# Patient Record
Sex: Female | Born: 1965 | Race: White | Hispanic: No | Marital: Married | Smoking: Former smoker
Health system: Southern US, Community
[De-identification: ages and names within clinical notes are randomized; demographics above are authoritative.]

## PROBLEM LIST (undated history)

## (undated) DIAGNOSIS — F329 Major depressive disorder, single episode, unspecified: Secondary | ICD-10-CM

## (undated) DIAGNOSIS — C3490 Malignant neoplasm of unspecified part of unspecified bronchus or lung: Secondary | ICD-10-CM

## (undated) DIAGNOSIS — M5416 Radiculopathy, lumbar region: Secondary | ICD-10-CM

## (undated) DIAGNOSIS — D72829 Elevated white blood cell count, unspecified: Secondary | ICD-10-CM

## (undated) DIAGNOSIS — S7291XA Unspecified fracture of right femur, initial encounter for closed fracture: Secondary | ICD-10-CM

## (undated) DIAGNOSIS — K635 Polyp of colon: Secondary | ICD-10-CM

## (undated) DIAGNOSIS — K219 Gastro-esophageal reflux disease without esophagitis: Secondary | ICD-10-CM

## (undated) DIAGNOSIS — E785 Hyperlipidemia, unspecified: Secondary | ICD-10-CM

## (undated) DIAGNOSIS — R87619 Unspecified abnormal cytological findings in specimens from cervix uteri: Secondary | ICD-10-CM

## (undated) DIAGNOSIS — K5790 Diverticulosis of intestine, part unspecified, without perforation or abscess without bleeding: Secondary | ICD-10-CM

## (undated) DIAGNOSIS — K529 Noninfective gastroenteritis and colitis, unspecified: Secondary | ICD-10-CM

## (undated) DIAGNOSIS — F419 Anxiety disorder, unspecified: Secondary | ICD-10-CM

## (undated) DIAGNOSIS — F32A Depression, unspecified: Secondary | ICD-10-CM

## (undated) DIAGNOSIS — I10 Essential (primary) hypertension: Secondary | ICD-10-CM

## (undated) DIAGNOSIS — R002 Palpitations: Secondary | ICD-10-CM

## (undated) DIAGNOSIS — R072 Precordial pain: Secondary | ICD-10-CM

## (undated) HISTORY — DX: Major depressive disorder, single episode, unspecified: F32.9

## (undated) HISTORY — DX: Noninfective gastroenteritis and colitis, unspecified: K52.9

## (undated) HISTORY — DX: Essential (primary) hypertension: I10

## (undated) HISTORY — DX: Unspecified fracture of right femur, initial encounter for closed fracture: S72.91XA

## (undated) HISTORY — DX: Depression, unspecified: F32.A

## (undated) HISTORY — DX: Precordial pain: R07.2

## (undated) HISTORY — PX: FRACTURE SURGERY: SHX138

## (undated) HISTORY — DX: Malignant neoplasm of unspecified part of unspecified bronchus or lung: C34.90

## (undated) HISTORY — DX: Palpitations: R00.2

## (undated) HISTORY — DX: Unspecified abnormal cytological findings in specimens from cervix uteri: R87.619

## (undated) HISTORY — PX: CERVICAL BIOPSY  W/ LOOP ELECTRODE EXCISION: SUR135

## (undated) HISTORY — DX: Anxiety disorder, unspecified: F41.9

## (undated) SURGERY — Surgical Case
Anesthesia: *Unknown

## (undated) MED FILL — Fosaprepitant Dimeglumine For IV Infusion 150 MG (Base Eq): INTRAVENOUS | Qty: 5 | Status: AC

## (undated) MED FILL — Dexamethasone Sodium Phosphate Inj 100 MG/10ML: INTRAMUSCULAR | Qty: 1 | Status: AC

---

## 2007-11-07 ENCOUNTER — Encounter: Payer: Self-pay | Admitting: Obstetrics & Gynecology

## 2007-11-07 ENCOUNTER — Ambulatory Visit: Payer: Self-pay | Admitting: Obstetrics & Gynecology

## 2007-12-20 ENCOUNTER — Ambulatory Visit: Payer: Self-pay | Admitting: Gastroenterology

## 2010-10-13 ENCOUNTER — Ambulatory Visit: Payer: Self-pay

## 2010-10-21 ENCOUNTER — Ambulatory Visit: Payer: Self-pay
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
## 2011-04-14 ENCOUNTER — Ambulatory Visit: Payer: Self-pay

## 2011-10-19 ENCOUNTER — Ambulatory Visit: Payer: Self-pay

## 2012-04-20 ENCOUNTER — Ambulatory Visit: Payer: Self-pay

## 2013-04-23 ENCOUNTER — Ambulatory Visit: Payer: Self-pay | Admitting: Family Medicine

## 2013-12-26 ENCOUNTER — Encounter (HOSPITAL_COMMUNITY): Payer: Self-pay | Admitting: Emergency Medicine

## 2013-12-26 ENCOUNTER — Emergency Department (HOSPITAL_COMMUNITY): Payer: BC Managed Care – PPO

## 2013-12-26 ENCOUNTER — Emergency Department (HOSPITAL_COMMUNITY)
Admission: EM | Admit: 2013-12-26 | Discharge: 2013-12-26 | Disposition: A | Discharge status: Home / Self Care | Payer: BC Managed Care – PPO | Attending: Emergency Medicine | Admitting: Emergency Medicine

## 2013-12-26 DIAGNOSIS — I10 Essential (primary) hypertension: Secondary | ICD-10-CM | POA: Insufficient documentation

## 2013-12-26 DIAGNOSIS — K5732 Diverticulitis of large intestine without perforation or abscess without bleeding: Secondary | ICD-10-CM | POA: Insufficient documentation

## 2013-12-26 DIAGNOSIS — K5792 Diverticulitis of intestine, part unspecified, without perforation or abscess without bleeding: Secondary | ICD-10-CM

## 2013-12-26 DIAGNOSIS — F17200 Nicotine dependence, unspecified, uncomplicated: Secondary | ICD-10-CM | POA: Insufficient documentation

## 2013-12-26 DIAGNOSIS — Z79899 Other long term (current) drug therapy: Secondary | ICD-10-CM | POA: Insufficient documentation

## 2013-12-26 LAB — CBC WITH DIFFERENTIAL/PLATELET
Basophils Absolute: 0 10*3/uL (ref 0.0–0.1)
Basophils Relative: 0 % (ref 0–1)
Eosinophils Absolute: 0.2 10*3/uL (ref 0.0–0.7)
Eosinophils Relative: 1 % (ref 0–5)
HCT: 43.4 % (ref 36.0–46.0)
Hemoglobin: 14.1 g/dL (ref 12.0–15.0)
Lymphocytes Relative: 23 % (ref 12–46)
Lymphs Abs: 4.1 10*3/uL — ABNORMAL HIGH (ref 0.7–4.0)
MCH: 30.1 pg (ref 26.0–34.0)
MCHC: 32.5 g/dL (ref 30.0–36.0)
MCV: 92.5 fL (ref 78.0–100.0)
Monocytes Absolute: 1.5 10*3/uL — ABNORMAL HIGH (ref 0.1–1.0)
Monocytes Relative: 9 % (ref 3–12)
Neutro Abs: 12 10*3/uL — ABNORMAL HIGH (ref 1.7–7.7)
Neutrophils Relative %: 67 % (ref 43–77)
Platelets: 269 10*3/uL (ref 150–400)
RBC: 4.69 MIL/uL (ref 3.87–5.11)
RDW: 14.5 % (ref 11.5–15.5)
WBC: 17.9 10*3/uL — ABNORMAL HIGH (ref 4.0–10.5)

## 2013-12-26 LAB — BASIC METABOLIC PANEL
BUN: 12 mg/dL (ref 6–23)
CO2: 21 mEq/L (ref 19–32)
Calcium: 8.9 mg/dL (ref 8.4–10.5)
Chloride: 101 mEq/L (ref 96–112)
Creatinine, Ser: 0.67 mg/dL (ref 0.50–1.10)
GFR calc Af Amer: >90 mL/min (ref >90)
GFR calc non Af Amer: >90 mL/min (ref >90)
Glucose, Bld: 86 mg/dL (ref 70–99)
Potassium: 4.1 mEq/L (ref 3.7–5.3)
Sodium: 138 mEq/L (ref 137–147)

## 2013-12-26 LAB — URINE MICROSCOPIC-ADD ON

## 2013-12-26 LAB — URINALYSIS, ROUTINE W REFLEX MICROSCOPIC
Bilirubin Urine: NEGATIVE
Glucose, UA: NEGATIVE mg/dL
Ketones, ur: NEGATIVE mg/dL
Leukocytes, UA: NEGATIVE
Nitrite: NEGATIVE
Protein, ur: NEGATIVE mg/dL
Specific Gravity, Urine: 1.028 (ref 1.005–1.030)
Urobilinogen, UA: 0.2 mg/dL (ref 0.0–1.0)
pH: 5 (ref 5.0–8.0)

## 2013-12-26 LAB — PREGNANCY, URINE: Preg Test, Ur: NEGATIVE

## 2013-12-26 MED ORDER — METRONIDAZOLE 500 MG PO TABS: 500.0000 mg | ORAL_TABLET | Freq: Three times a day (TID) | ORAL | Status: DC

## 2013-12-26 MED ORDER — OXYCODONE-ACETAMINOPHEN 5-325 MG PO TABS: 1.0000 | ORAL_TABLET | ORAL | Status: DC | PRN

## 2013-12-26 MED ORDER — ONDANSETRON HCL 4 MG/2ML IJ SOLN
4.0000 mg | Freq: Once | INTRAMUSCULAR | Status: AC
  Administered 2013-12-26: 4 mg via INTRAVENOUS
  Filled 2013-12-26: qty 2

## 2013-12-26 MED ORDER — MORPHINE SULFATE 4 MG/ML IJ SOLN
4.0000 mg | Freq: Once | INTRAMUSCULAR | Status: AC
  Administered 2013-12-26: 4 mg via INTRAVENOUS
  Filled 2013-12-26: qty 1

## 2013-12-26 MED ORDER — KETOROLAC TROMETHAMINE 15 MG/ML IJ SOLN
15.0000 mg | Freq: Once | INTRAMUSCULAR | Status: AC
  Administered 2013-12-26: 15 mg via INTRAVENOUS
  Filled 2013-12-26: qty 1

## 2013-12-26 MED ORDER — CIPROFLOXACIN IN D5W 400 MG/200ML IV SOLN
400.0000 mg | Freq: Once | INTRAVENOUS | Status: AC
  Administered 2013-12-26: 400 mg via INTRAVENOUS
  Filled 2013-12-26: qty 200

## 2013-12-26 MED ORDER — SODIUM CHLORIDE 0.9 % IV BOLUS (SEPSIS)
1000.0000 mL | Freq: Once | INTRAVENOUS | Status: AC
  Administered 2013-12-26: 1000 mL via INTRAVENOUS

## 2013-12-26 MED ORDER — CIPROFLOXACIN HCL 500 MG PO TABS: 500.0000 mg | ORAL_TABLET | Freq: Two times a day (BID) | ORAL | Status: DC

## 2013-12-26 MED ORDER — IOHEXOL 300 MG/ML  SOLN
100.0000 mL | Freq: Once | INTRAMUSCULAR | Status: AC | PRN
  Administered 2013-12-26: 100 mL via INTRAVENOUS

## 2013-12-26 MED ORDER — IOHEXOL 300 MG/ML  SOLN
25.0000 mL | Freq: Once | INTRAMUSCULAR | Status: AC | PRN
  Administered 2013-12-26: 25 mL via ORAL

## 2013-12-26 MED ORDER — METRONIDAZOLE IN NACL 5-0.79 MG/ML-% IV SOLN
500.0000 mg | Freq: Once | INTRAVENOUS | Status: AC
  Administered 2013-12-26: 500 mg via INTRAVENOUS
  Filled 2013-12-26: qty 100

## 2013-12-26 NOTE — ED Notes (Addendum)
Pt c/o pelvic pain that started last night.  Pt denies any other symptoms.  Pt states she has an IUD in place.

## 2013-12-26 NOTE — Discharge Instructions (Signed)
Diverticulitis °A diverticulum is a small pouch or sac on the colon. Diverticulosis is the presence of these diverticula on the colon. Diverticulitis is the irritation (inflammation) or infection of diverticula. °CAUSES  °The colon and its diverticula contain bacteria. If food particles block the tiny opening to a diverticulum, the bacteria inside can grow and cause an increase in pressure. This leads to infection and inflammation and is called diverticulitis. °SYMPTOMS  °· Abdominal pain and tenderness. Usually, the pain is located on the left side of your abdomen. However, it could be located elsewhere. °· Fever. °· Bloating. °· Feeling sick to your stomach (nausea). °· Throwing up (vomiting). °· Abnormal stools. °DIAGNOSIS  °Your caregiver will take a history and perform a physical exam. Since many things can cause abdominal pain, other tests may be necessary. Tests may include: °· Blood tests. °· Urine tests. °· X-ray of the abdomen. °· CT scan of the abdomen. °Sometimes, surgery is needed to determine if diverticulitis or other conditions are causing your symptoms. °TREATMENT  °Most of the time, you can be treated without surgery. Treatment includes: °· Resting the bowels by only having liquids for a few days. As you improve, you will need to eat a low-fiber diet. °· Intravenous (IV) fluids if you are losing body fluids (dehydrated). °· Antibiotic medicines that treat infections may be given. °· Pain and nausea medicine, if needed. °· Surgery if the inflamed diverticulum has burst. °HOME CARE INSTRUCTIONS  °· Try a clear liquid diet (broth, tea, or water for as long as directed by your caregiver). You may then gradually begin a low-fiber diet as tolerated.  °A low-fiber diet is a diet with less than 10 grams of fiber. Choose the foods below to reduce fiber in the diet: °· White breads, cereals, rice, and pasta. °· Cooked fruits and vegetables or soft fresh fruits and vegetables without the skin. °· Ground or  well-cooked tender beef, ham, veal, lamb, pork, or poultry. °· Eggs and seafood. °· After your diverticulitis symptoms have improved, your caregiver may put you on a high-fiber diet. A high-fiber diet includes 14 grams of fiber for every 1000 calories consumed. For a standard 2000 calorie diet, you would need 28 grams of fiber. Follow these diet guidelines to help you increase the fiber in your diet. It is important to slowly increase the amount fiber in your diet to avoid gas, constipation, and bloating. °· Choose whole-grain breads, cereals, pasta, and brown rice. °· Choose fresh fruits and vegetables with the skin on. Do not overcook vegetables because the more vegetables are cooked, the more fiber is lost. °· Choose more nuts, seeds, legumes, dried peas, beans, and lentils. °· Look for food products that have greater than 3 grams of fiber per serving on the Nutrition Facts label. °· Take all medicine as directed by your caregiver. °· If your caregiver has given you a follow-up appointment, it is very important that you go. Not going could result in lasting (chronic) or permanent injury, pain, and disability. If there is any problem keeping the appointment, call to reschedule. °SEEK MEDICAL CARE IF:  °· Your pain does not improve. °· You have a hard time advancing your diet beyond clear liquids. °· Your bowel movements do not return to normal. °SEEK IMMEDIATE MEDICAL CARE IF:  °· Your pain becomes worse. °· You have an oral temperature above 102° F (38.9° C), not controlled by medicine. °· You have repeated vomiting. °· You have bloody or black, tarry stools. °·   Symptoms that brought you to your caregiver become worse or are not getting better. °MAKE SURE YOU:  °· Understand these instructions. °· Will watch your condition. °· Will get help right away if you are not doing well or get worse. °Document Released: 06/09/2005 Document Revised: 11/22/2011 Document Reviewed: 10/05/2010 °[employer]® Patient Information  ©2014 [employer]. ° °

## 2013-12-31 NOTE — ED Provider Notes (Signed)
CSN: [id number]     Arrival date & time 12/26/13  1223 History   First MD Initiated Contact with Patient 12/26/13 1458     Chief Complaint  Patient presents with  . Pelvic Pain     (Consider location/radiation/quality/duration/timing/severity/associated sxs/prior Treatment) HPI  48 year old female with abdominal pain. Gradual onset last night and has progressively been worsening. Fairly constant. Pain is across lower abdomen. Pain does not particularly lateralize. No urinary complaints. Denies or vomiting. No diarrhea. IUD in place. No unusual vaginal bleeding or discharge. No history similar symptoms.  Past Medical History  Diagnosis Date  . Hypertension    History reviewed. No pertinent past surgical history. No family history on file. History  Substance Use Topics  . Smoking status: Current Every Day Smoker  . Smokeless tobacco: Not on file  . Alcohol Use: Yes   OB History   Grav Para Term Preterm Abortions TAB SAB Ect Mult Living                 Review of Systems  All systems reviewed and negative, other than as noted in HPI.   Allergies  Review of patient's allergies indicates no known allergies.  Home Medications   Prior to Admission medications   Medication Sig Start Date End Date Taking? Authorizing Provider  ALPRAZolam Duanne Moron) 0.5 MG tablet Take 0.5 mg by mouth 2 (two) times daily as needed. For anxiety 11/29/13  Yes Historical Provider, MD  Aspirin-Salicylamide-Caffeine (BC HEADACHE POWDER PO) Take 1 packet by mouth daily as needed (for pain).   Yes Historical Provider, MD  lisinopril-hydrochlorothiazide (PRINZIDE,ZESTORETIC) 20-12.5 MG per tablet Take 1 tablet by mouth daily. 11/29/13  Yes Historical Provider, MD  ciprofloxacin (CIPRO) 500 MG tablet Take 1 tablet (500 mg total) by mouth every 12 (twelve) hours. 12/26/13   Virgel Manifold, MD  metroNIDAZOLE (FLAGYL) 500 MG tablet Take 1 tablet (500 mg total) by mouth 3 (three) times daily. 12/26/13   Virgel Manifold,  MD  oxyCODONE-acetaminophen (PERCOCET/ROXICET) 5-325 MG per tablet Take 1-2 tablets by mouth every 4 (four) hours as needed for severe pain. 12/26/13   Virgel Manifold, MD   BP 139/85  Pulse 87  Temp(Src) 98.4 F (36.9 C) (Oral)  Resp 18  Ht 5' 7"  (1.702 m)  Wt 210 lb (95.255 kg)  BMI 32.88 kg/m2  SpO2 99% Physical Exam  Nursing note and vitals reviewed. Constitutional: She appears well-developed and well-nourished. No distress.  HENT:  Head: Normocephalic and atraumatic.  Eyes: Conjunctivae are normal. Right eye exhibits no discharge. Left eye exhibits no discharge.  Neck: Neck supple.  Cardiovascular: Normal rate, regular rhythm and normal heart sounds.  Exam reveals no gallop and no friction rub.   No murmur heard. Pulmonary/Chest: Effort normal and breath sounds normal. No respiratory distress.  Abdominal: Soft. She exhibits no distension and no mass. There is tenderness. There is no rebound and no guarding.  Tenderness across lower abdomen with some voluntary guarding on deep palpation. No rebound. No involuntary guarding. No distention.  Musculoskeletal: She exhibits no edema and no tenderness.  No CVA tenderness  Neurological: She is alert.  Skin: Skin is warm and dry.  Psychiatric: She has a normal mood and affect. Her behavior is normal. Thought content normal.    ED Course  Procedures (including critical care time) Labs Review Labs Reviewed  CBC WITH DIFFERENTIAL - Abnormal; Notable for the following:    WBC 17.9 (*)    Neutro Abs 12.0 (*)    Lymphs  Abs 4.1 (*)    Monocytes Absolute 1.5 (*)    All other components within normal limits  URINALYSIS, ROUTINE W REFLEX MICROSCOPIC - Abnormal; Notable for the following:    APPearance HAZY (*)    Hgb urine dipstick MODERATE (*)    All other components within normal limits  URINE MICROSCOPIC-ADD ON - Abnormal; Notable for the following:    Squamous Epithelial / LPF FEW (*)    Bacteria, UA MANY (*)    All other  components within normal limits  BASIC METABOLIC PANEL  PREGNANCY, URINE    Imaging Review No results found.  Ct Abdomen Pelvis W Contrast  12/26/2013   CLINICAL DATA:  Pelvic pain, history hypertension, IUD  EXAM: CT ABDOMEN AND PELVIS WITH CONTRAST  TECHNIQUE: Multidetector CT imaging of the abdomen and pelvis was performed using the standard protocol following bolus administration of intravenous contrast. Sagittal and coronal MPR images reconstructed from axial data set.  CONTRAST:  145m OMNIPAQUE IOHEXOL 300 MG/ML SOLN IV. Dilute oral contrast.  COMPARISON:  None  FINDINGS: Dependent atelectasis in bilateral lower lobes.  Small hepatic and left renal cysts.  Tiny nonobstructing calculus mid right kidney.  Mildly prominent right renal collecting system and proximal right ureter though the distal right ureter becomes normal caliber and no definite ureteral calculus or bladder abnormality is seen.  Remainder of liver, spleen, pancreas, kidneys, and adrenal glands normal.  Stomach and small bowel loops normal appearance.  Normal appendix.  IUD within uterus.  Small right ovarian cyst 2.4 x 2.5 cm image 83.  Unremarkable left ovary.  Diverticulosis of descending and sigmoid colon with focal wall thickening and pericolic inflammatory changes at the mid sigmoid colon compatible with acute diverticulitis.  Remainder of colon unremarkable.  No additional mass, adenopathy, free fluid, or free air.  No acute osseous findings.  IMPRESSION: Acute sigmoid diverticulitis.  Tiny nonobstructing right renal calculus.  Small hepatic, left renal, and right ovarian cysts.   Electronically Signed   By: MLavonia DanaM.D.   On: 12/26/2013 16:55    EKG Interpretation None      MDM   Final diagnoses:  Diverticulitis    48year old female with lower, pain. Imaging significant for diverticulitis. No evidence of perforation or abscess. Just has some tenderness on exam, but she's not peritoneal. I feel she is  appropriate for outpatient treatment. Antibiotics. As needed pain medication. Return precautions were discussed. Outpatient followup as needed otherwise.    SVirgel Manifold MD 12/31/13 1019

## 2014-10-30 ENCOUNTER — Ambulatory Visit: Payer: Self-pay | Admitting: Family Medicine

## 2015-04-08 ENCOUNTER — Ambulatory Visit: Payer: Self-pay | Admitting: Obstetrics and Gynecology

## 2015-04-24 ENCOUNTER — Encounter: Payer: Self-pay | Admitting: Obstetrics and Gynecology

## 2015-04-24 ENCOUNTER — Ambulatory Visit (INDEPENDENT_AMBULATORY_CARE_PROVIDER_SITE_OTHER): Payer: BLUE CROSS/BLUE SHIELD | Admitting: Obstetrics and Gynecology

## 2015-04-24 VITALS — BP 141/98 | HR 88 | Ht 66.0 in | Wt 205.6 lb

## 2015-04-24 DIAGNOSIS — Z30014 Encounter for initial prescription of intrauterine contraceptive device: Secondary | ICD-10-CM

## 2015-04-24 LAB — POCT URINE PREGNANCY: Preg Test, Ur: NEGATIVE

## 2015-04-24 MED ORDER — LEVONORGESTREL 20 MCG/24HR IU IUD: 1.0000 | INTRAUTERINE_SYSTEM | Freq: Once | INTRAUTERINE | Status: DC

## 2015-04-24 NOTE — Progress Notes (Signed)
    GYNECOLOGY CLINIC PROCEDURE NOTE  DELLE ANDRZEJEWSKI is a 48 y.o. [phone] here for Mirena IUD insertion. No GYN concerns. Patient's last menstrual period was 04/23/2015 (exact date).  IUD Insertion Procedure Note Patient identified, informed consent performed, consent signed.   Discussed risks of irregular bleeding, cramping, infection, malpositioning or misplacement of the IUD outside the uterus which may require further procedure such as laparoscopy. Time out was performed.  Urine pregnancy test not done as patient currently on menses.  Speculum placed in the vagina.  Cervix visualized.  Cleaned with Betadine x 2.  Grasped anteriorly with a single tooth tenaculum.  Uterus sounded to 7 cm.  Mirena IUD placed per manufacturer's recommendations.  Strings trimmed to 3 cm. Tenaculum was removed, good hemostasis noted.  Patient tolerated procedure well.   Patient was given post-procedure instructions.  She was advised to have backup contraception for one week.  Patient was also asked to check IUD strings periodically and follow up in 4 weeks for IUD check.   Rubie Maid, MD [facility]

## 2015-04-24 NOTE — Patient Instructions (Signed)

## 2015-04-24 NOTE — Progress Notes (Signed)
Patient ID: Gina Sanchez, female   DOB: 06/02/1966, 49 y.o.   MRN: [id number] Pt presents for mirena insertion-

## 2015-04-30 ENCOUNTER — Ambulatory Visit: Payer: Self-pay | Admitting: Obstetrics and Gynecology

## 2015-05-22 ENCOUNTER — Ambulatory Visit: Payer: BLUE CROSS/BLUE SHIELD | Admitting: Obstetrics and Gynecology

## 2015-06-10 ENCOUNTER — Other Ambulatory Visit: Payer: Self-pay | Admitting: Nurse Practitioner

## 2015-06-10 DIAGNOSIS — K5909 Other constipation: Secondary | ICD-10-CM

## 2015-06-20 ENCOUNTER — Ambulatory Visit
Admission: RE | Admit: 2015-06-20 | Discharge: 2015-06-20 | Disposition: A | Discharge status: Home / Self Care | Payer: BLUE CROSS/BLUE SHIELD | Source: Ambulatory Visit | Attending: Nurse Practitioner | Admitting: Nurse Practitioner

## 2015-06-20 DIAGNOSIS — R1031 Right lower quadrant pain: Secondary | ICD-10-CM | POA: Diagnosis present

## 2015-06-20 DIAGNOSIS — K5909 Other constipation: Secondary | ICD-10-CM | POA: Diagnosis present

## 2015-06-20 DIAGNOSIS — R1032 Left lower quadrant pain: Secondary | ICD-10-CM | POA: Diagnosis present

## 2015-06-20 MED ORDER — IOHEXOL 300 MG/ML  SOLN
100.0000 mL | Freq: Once | INTRAMUSCULAR | Status: AC | PRN
  Administered 2015-06-20: 100 mL via INTRAVENOUS

## 2015-07-02 ENCOUNTER — Encounter: Payer: Self-pay | Admitting: *Deleted

## 2015-07-03 ENCOUNTER — Ambulatory Visit: Payer: BLUE CROSS/BLUE SHIELD | Admitting: Certified Registered Nurse Anesthetist

## 2015-07-03 ENCOUNTER — Encounter
Admission: RE | Disposition: A | Discharge status: Home / Self Care | Payer: Self-pay | Source: Ambulatory Visit | Attending: Gastroenterology

## 2015-07-03 ENCOUNTER — Encounter: Payer: Self-pay | Admitting: Anesthesiology

## 2015-07-03 ENCOUNTER — Ambulatory Visit
Admission: RE | Admit: 2015-07-03 | Discharge: 2015-07-03 | Disposition: A | Discharge status: Home / Self Care | Payer: BLUE CROSS/BLUE SHIELD | Source: Ambulatory Visit | Attending: Gastroenterology | Admitting: Gastroenterology

## 2015-07-03 DIAGNOSIS — Z8249 Family history of ischemic heart disease and other diseases of the circulatory system: Secondary | ICD-10-CM | POA: Diagnosis not present

## 2015-07-03 DIAGNOSIS — K5730 Diverticulosis of large intestine without perforation or abscess without bleeding: Secondary | ICD-10-CM | POA: Insufficient documentation

## 2015-07-03 DIAGNOSIS — R1030 Lower abdominal pain, unspecified: Secondary | ICD-10-CM | POA: Diagnosis not present

## 2015-07-03 DIAGNOSIS — Z808 Family history of malignant neoplasm of other organs or systems: Secondary | ICD-10-CM | POA: Insufficient documentation

## 2015-07-03 DIAGNOSIS — Z800 Family history of malignant neoplasm of digestive organs: Secondary | ICD-10-CM | POA: Insufficient documentation

## 2015-07-03 DIAGNOSIS — Z79899 Other long term (current) drug therapy: Secondary | ICD-10-CM | POA: Diagnosis not present

## 2015-07-03 DIAGNOSIS — Z881 Allergy status to other antibiotic agents status: Secondary | ICD-10-CM | POA: Insufficient documentation

## 2015-07-03 DIAGNOSIS — K5900 Constipation, unspecified: Secondary | ICD-10-CM | POA: Insufficient documentation

## 2015-07-03 DIAGNOSIS — Z86010 Personal history of colonic polyps: Secondary | ICD-10-CM | POA: Diagnosis not present

## 2015-07-03 DIAGNOSIS — R1310 Dysphagia, unspecified: Secondary | ICD-10-CM | POA: Diagnosis present

## 2015-07-03 DIAGNOSIS — I10 Essential (primary) hypertension: Secondary | ICD-10-CM | POA: Insufficient documentation

## 2015-07-03 DIAGNOSIS — F17200 Nicotine dependence, unspecified, uncomplicated: Secondary | ICD-10-CM | POA: Insufficient documentation

## 2015-07-03 DIAGNOSIS — Z833 Family history of diabetes mellitus: Secondary | ICD-10-CM | POA: Insufficient documentation

## 2015-07-03 DIAGNOSIS — Z888 Allergy status to other drugs, medicaments and biological substances status: Secondary | ICD-10-CM | POA: Diagnosis not present

## 2015-07-03 HISTORY — PX: COLONOSCOPY WITH PROPOFOL: SHX5780

## 2015-07-03 HISTORY — PX: ESOPHAGOGASTRODUODENOSCOPY (EGD) WITH PROPOFOL: SHX5813

## 2015-07-03 LAB — POCT PREGNANCY, URINE: Preg Test, Ur: NEGATIVE

## 2015-07-03 SURGERY — COLONOSCOPY WITH PROPOFOL
Anesthesia: General

## 2015-07-03 MED ORDER — MIDAZOLAM HCL 2 MG/2ML IJ SOLN
INTRAMUSCULAR | Status: DC | PRN
  Administered 2015-07-03 (×2): 1 mg via INTRAVENOUS

## 2015-07-03 MED ORDER — LIDOCAINE HCL (CARDIAC) 20 MG/ML IV SOLN
INTRAVENOUS | Status: DC | PRN
  Administered 2015-07-03: 100 mg via INTRAVENOUS

## 2015-07-03 MED ORDER — GLYCOPYRROLATE 0.2 MG/ML IJ SOLN
INTRAMUSCULAR | Status: DC | PRN
  Administered 2015-07-03: 0.2 mg via INTRAVENOUS

## 2015-07-03 MED ORDER — SODIUM CHLORIDE 0.9 % IV SOLN
INTRAVENOUS | Status: DC
  Administered 2015-07-03: 1000 mL via INTRAVENOUS

## 2015-07-03 MED ORDER — PROPOFOL 10 MG/ML IV BOLUS
INTRAVENOUS | Status: DC | PRN
  Administered 2015-07-03 (×4): 50 mg via INTRAVENOUS

## 2015-07-03 MED ORDER — PROPOFOL 500 MG/50ML IV EMUL
INTRAVENOUS | Status: DC | PRN
  Administered 2015-07-03: 140 ug/kg/min via INTRAVENOUS

## 2015-07-03 MED ORDER — IPRATROPIUM-ALBUTEROL 0.5-2.5 (3) MG/3ML IN SOLN
RESPIRATORY_TRACT | Status: AC
  Administered 2015-07-03: 3 mL via RESPIRATORY_TRACT
  Filled 2015-07-03: qty 3

## 2015-07-03 MED ORDER — SODIUM CHLORIDE 0.9 % IV SOLN: INTRAVENOUS | Status: DC

## 2015-07-03 NOTE — H&P (Signed)
  Date of Initial H&P: 06/09/2015  History reviewed, patient examined, no change in status, stable for surgery.

## 2015-07-03 NOTE — Transfer of Care (Signed)
Immediate Anesthesia Transfer of Care Note  Patient: Gina Sanchez  Procedure(s) Performed: Procedure(s): COLONOSCOPY WITH PROPOFOL (N/A) ESOPHAGOGASTRODUODENOSCOPY (EGD) WITH PROPOFOL (N/A)  Patient Location: PACU  Anesthesia Type:General  Level of Consciousness: awake  Airway & Oxygen Therapy: Patient Spontanous Breathing  Post-op Assessment: Report given to RN and Post -op Vital signs reviewed and stable  Post vital signs: Reviewed and stable  Last Vitals:  Filed Vitals:   07/03/15 1458  BP: 110/68  Pulse: 90  Temp: 35.8 C  Resp: 19    Complications: No apparent anesthesia complications

## 2015-07-03 NOTE — Op Note (Signed)
[facility] Patient Name: Gina Sanchez Procedure Date: 07/03/2015 2:30 PM MRN: [id number] Account #: [id number] Date of Birth: 12-24-1965 Admit Type: Outpatient Age: 49 Room: [facility] ENDO ROOM 4 Gender: Female Note Status: Finalized Procedure:         Colonoscopy Indications:       Lower abdominal pain, Follow-up of diverticulitis,                     Constipation Providers:         Lupita Dawn. Candace Cruise, MD Referring MD:      Ruffin Frederick. Brunetta Genera, MD (Referring MD) Medicines:         Monitored Anesthesia Care Complications:     No immediate complications. Procedure:         Pre-Anesthesia Assessment:                    - Prior to the procedure, a History and Physical was                     performed, and patient medications, allergies and                     sensitivities were reviewed. The patient's tolerance of                     previous anesthesia was reviewed.                    - The risks and benefits of the procedure and the sedation                     options and risks were discussed with the patient. All                     questions were answered and informed consent was obtained.                    - After reviewing the risks and benefits, the patient was                     deemed in satisfactory condition to undergo the procedure.                    After obtaining informed consent, the colonoscope was                     passed under direct vision. Throughout the procedure, the                     patient's blood pressure, pulse, and oxygen saturations                     were monitored continuously. The Colonoscope was                     introduced through the anus and advanced to the the cecum,                     identified by appendiceal orifice and ileocecal valve. The                     colonoscopy was performed without difficulty. The patient  tolerated the procedure well. The quality of the bowel         preparation was good. Findings:      Multiple small and large-mouthed diverticula were found in the sigmoid       colon.      The exam was otherwise without abnormality.      No evidence of acute diverticulitis Impression:        - Diverticulosis in the sigmoid colon.                    - The examination was otherwise normal.                    - No specimens collected. Recommendation:    - Discharge patient to home.                    - Repeat colonoscopy in 10 years for surveillance.                    - The findings and recommendations were discussed with the                     patient.                    - High fiber diet daily.                    - Avoid seeds or nuts Procedure Code(s): --- Professional ---                    [phone], Colonoscopy, flexible; diagnostic, including                     collection of specimen(s) by brushing or washing, when                     performed (separate procedure) Diagnosis Code(s): --- Professional ---                    R10.30, Lower abdominal pain, unspecified                    K57.32, Diverticulitis of large intestine without                     perforation or abscess without bleeding                    K59.00, Constipation, unspecified                    K57.30, Diverticulosis of large intestine without                     perforation or abscess without bleeding CPT copyright 2014 American Medical Association. All rights reserved. The codes documented in this report are preliminary and upon coder review may  be revised to meet current compliance requirements. Hulen Luster, MD 07/03/2015 2:52:48 PM This report has been signed electronically. Number of Addenda: 0 Note Initiated On: 07/03/2015 2:30 PM Scope Withdrawal Time: 0 hours 4 minutes 30 seconds  Total Procedure Duration: 0 hours 7 minutes 34 seconds       [facility]

## 2015-07-03 NOTE — Anesthesia Postprocedure Evaluation (Signed)
  Anesthesia Post-op Note  Patient: Gina Sanchez  Procedure(s) Performed: Procedure(s): COLONOSCOPY WITH PROPOFOL (N/A) ESOPHAGOGASTRODUODENOSCOPY (EGD) WITH PROPOFOL (N/A)  Anesthesia type:General  Patient location: PACU  Post pain: Pain level controlled  Post assessment: Post-op Vital signs reviewed, Patient's Cardiovascular Status Stable, Respiratory Function Stable, Patent Airway and No signs of Nausea or vomiting  Post vital signs: Reviewed and stable  Last Vitals:  Filed Vitals:   07/03/15 1530  BP: 113/81  Pulse: 89  Temp:   Resp: 14    Level of consciousness: awake, alert  and patient cooperative  Complications: No apparent anesthesia complications

## 2015-07-03 NOTE — Anesthesia Preprocedure Evaluation (Signed)
Anesthesia Evaluation  Patient identified by MRN, date of birth, ID band Patient awake    Reviewed: Allergy & Precautions, H&P , NPO status , Patient's Chart, lab work & pertinent test results  History of Anesthesia Complications Negative for: history of anesthetic complications  Airway Mallampati: III  TM Distance: >3 FB Neck ROM: limited    Dental no notable dental hx. (+) Teeth Intact   Pulmonary neg shortness of breath, Current Smoker,    Pulmonary exam normal breath sounds clear to auscultation       Cardiovascular Exercise Tolerance: Good hypertension, (-) angina(-) Past MI and (-) DOE Normal cardiovascular exam Rhythm:regular Rate:Normal     Neuro/Psych PSYCHIATRIC DISORDERS Anxiety Depression negative neurological ROS     GI/Hepatic negative GI ROS, Neg liver ROS,   Endo/Other  negative endocrine ROS  Renal/GU negative Renal ROS  negative genitourinary   Musculoskeletal   Abdominal   Peds  Hematology negative hematology ROS (+)   Anesthesia Other Findings Past Medical History:   Hypertension                                                 Anxiety                                                      Depression                                                   Abnormal Pap smear of cervix                                 Hypercholesteremia                                          Past Surgical History:   CERVICAL BIOPSY  W/ LOOP ELECTRODE EXCISION                  BMI    Body Mass Index   33.26 kg/m 2      Reproductive/Obstetrics negative OB ROS                             Anesthesia Physical Anesthesia Plan  ASA: III  Anesthesia Plan: General   Post-op Pain Management:    Induction:   Airway Management Planned:   Additional Equipment:   Intra-op Plan:   Post-operative Plan:   Informed Consent: I have reviewed the patients History and Physical, chart, labs  and discussed the procedure including the risks, benefits and alternatives for the proposed anesthesia with the patient or authorized representative who has indicated his/her understanding and acceptance.   Dental Advisory Given  Plan Discussed with: Anesthesiologist, CRNA and Surgeon  Anesthesia Plan Comments:         Anesthesia Quick Evaluation

## 2015-07-03 NOTE — Op Note (Signed)
[facility] Gastroenterology Patient Name: Gina Sanchez Procedure Date: 07/03/2015 2:32 PM MRN: [id number] Account #: [id number] Date of Birth: 11/17/1965 Admit Type: Outpatient Age: 49 Room: [facility] ENDO ROOM 4 Gender: Female Note Status: Finalized Procedure:         Upper GI endoscopy Indications:       Dysphagia, Suspected esophageal reflux Providers:         Lupita Dawn. Candace Cruise, MD Referring MD:      Ruffin Frederick. Brunetta Genera, MD (Referring MD) Medicines:         Monitored Anesthesia Care Complications:     No immediate complications. Procedure:         Pre-Anesthesia Assessment:                    - Prior to the procedure, a History and Physical was                     performed, and patient medications, allergies and                     sensitivities were reviewed. The patient's tolerance of                     previous anesthesia was reviewed.                    - The risks and benefits of the procedure and the sedation                     options and risks were discussed with the patient. All                     questions were answered and informed consent was obtained.                    - After reviewing the risks and benefits, the patient was                     deemed in satisfactory condition to undergo the procedure.                    After obtaining informed consent, the endoscope was passed                     under direct vision. Throughout the procedure, the                     patient's blood pressure, pulse, and oxygen saturations                     were monitored continuously. The Olympus GIF-160 endoscope                     (S#. [id number]) was introduced through the mouth, and                     advanced to the second part of duodenum. The upper GI                     endoscopy was accomplished without difficulty. The patient                     tolerated the procedure well. Findings:      No endoscopic abnormality was evident in the esophagus to  explain the       patient's complaint of dysphagia. It was decided, however, to proceed       with dilation of the entire esophagus. The scope was withdrawn. Dilation       was performed with a Maloney dilator with mild resistance at 64 Fr.      The entire examined stomach was normal.      The examined duodenum was normal. Impression:        - No endoscopic esophageal abnormality to explain                     patient's dysphagia. Esophagus dilated. Dilated.                    - Normal stomach.                    - Normal examined duodenum.                    - No specimens collected. Recommendation:    - Discharge patient to home.                    - Observe patient's clinical course.                    - The findings and recommendations were discussed with the                     patient. Procedure Code(s): --- Professional ---                    [phone], Esophagogastroduodenoscopy, flexible, transoral;                     diagnostic, including collection of specimen(s) by                     brushing or washing, when performed (separate procedure)                    43450, Dilation of esophagus, by unguided sound or bougie,                     single or multiple passes Diagnosis Code(s): --- Professional ---                    R13.10, Dysphagia, unspecified CPT copyright 2014 American Medical Association. All rights reserved. The codes documented in this report are preliminary and upon coder review may  be revised to meet current compliance requirements. Hulen Luster, MD 07/03/2015 2:38:29 PM This report has been signed electronically. Number of Addenda: 0 Note Initiated On: 07/03/2015 2:32 PM      [facility]

## 2015-07-04 ENCOUNTER — Encounter: Payer: Self-pay | Admitting: Gastroenterology

## 2015-09-14 HISTORY — PX: BREAST BIOPSY: SHX20

## 2015-09-26 ENCOUNTER — Ambulatory Visit (INDEPENDENT_AMBULATORY_CARE_PROVIDER_SITE_OTHER): Payer: BLUE CROSS/BLUE SHIELD | Admitting: Nurse Practitioner

## 2015-09-26 ENCOUNTER — Encounter: Payer: Self-pay | Admitting: Nurse Practitioner

## 2015-09-26 VITALS — BP 122/90 | HR 80 | Ht 66.0 in | Wt 205.1 lb

## 2015-09-26 DIAGNOSIS — E663 Overweight: Secondary | ICD-10-CM

## 2015-09-26 DIAGNOSIS — E7800 Pure hypercholesterolemia, unspecified: Secondary | ICD-10-CM

## 2015-09-26 DIAGNOSIS — R072 Precordial pain: Secondary | ICD-10-CM | POA: Diagnosis not present

## 2015-09-26 DIAGNOSIS — E785 Hyperlipidemia, unspecified: Secondary | ICD-10-CM | POA: Insufficient documentation

## 2015-09-26 DIAGNOSIS — R0609 Other forms of dyspnea: Secondary | ICD-10-CM

## 2015-09-26 DIAGNOSIS — R002 Palpitations: Secondary | ICD-10-CM | POA: Diagnosis not present

## 2015-09-26 DIAGNOSIS — Z720 Tobacco use: Secondary | ICD-10-CM

## 2015-09-26 DIAGNOSIS — R0600 Dyspnea, unspecified: Secondary | ICD-10-CM

## 2015-09-26 DIAGNOSIS — I10 Essential (primary) hypertension: Secondary | ICD-10-CM | POA: Diagnosis not present

## 2015-09-26 NOTE — Progress Notes (Addendum)
Cardiology Clinic Note   Patient Name: Gina Sanchez Date of Encounter: 09/26/2015  Primary Care Provider:  Lorelee Market, MD Primary Cardiologist:  New  Patient Profile     50 year old female without prior cardiac history who presents for evaluation of chest pain and dyspnea.  Past Medical History    Past Medical History  Diagnosis Date  . Essential hypertension   . Anxiety   . Depression   . Abnormal Pap smear of cervix   . Precordial chest pain   . Palpitations    Past Surgical History  Procedure Laterality Date  . Cervical biopsy  w/ loop electrode excision    . Colonoscopy with propofol N/A 07/03/2015    Procedure: COLONOSCOPY WITH PROPOFOL;  Surgeon: Hulen Luster, MD;  Location: [facility] ENDOSCOPY;  Service: Gastroenterology;  Laterality: N/A;  . Esophagogastroduodenoscopy (egd) with propofol N/A 07/03/2015    Procedure: ESOPHAGOGASTRODUODENOSCOPY (EGD) WITH PROPOFOL;  Surgeon: Hulen Luster, MD;  Location: [facility] ENDOSCOPY;  Service: Gastroenterology;  Laterality: N/A;    Allergies  Allergies  Allergen Reactions  . Factive [Gemifloxacin] Rash    History of Present Illness     50 year old female with a prior history of hypertension and anxiety.  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    She does have some degree of chronic dyspnea and exertion at higher levels of activity. She is always attributed this to her smoking and has not seen any worsening or progression of this dyspnea.She has no prior cardiac history. Over the past 1-2 months, she has noted intermittent substernal chest tightness , occurring 1-2 times per week, sometimes associated with dyspnea, occurring almost exclusively at rest and especially when she lies down for bed at night , lasting somewhere and 5 and 30 minutes, and often resolving after she takes a half of a Xanax. She thinks that she may have had similar symptoms with  activity but isn't sure.  Over the same period of time, she has been experiencing intermittent palpitations , described as a brief flop in her chest. She has not had any sustained tachycardia or palpitations.   Finally, she's been expressing intermittent lightheadedness in the absence of other symptoms. She has never lost consciousness. In the setting of this constellation of symptoms, an ECG was performed at her work one day and she was told it was abnormal. She has a copy of it with her today. Most notable is that it is missing V4, V5, and V6. She has an upright R-wave in V1 and a PVC is also noted. Because of this ECG, she was told she needed cardiology follow-up. Repeat ECG today is normal.   She denies any prior history of PND, orthopnea, syncope, edema, or early satiety.  Home Medications    Prior to Admission medications   Medication Sig Start Date End Date Taking? Authorizing Provider  ALPRAZolam Duanne Moron) 0.5 MG tablet Take 0.5 mg by mouth 2 (two) times daily as needed. For anxiety 11/29/13  Yes Historical Provider, MD  levonorgestrel (MIRENA, 52 MG,) 20 MCG/24HR IUD 1 Intra Uterine Device (1 each total) by Intrauterine route once. 04/24/15  Yes Rubie Maid, MD  lisinopril (PRINIVIL,ZESTRIL) 20 MG tablet Take 25 mg by mouth daily.   Yes Historical Provider, MD    Family History    Family History  Problem Relation Age of Onset  . Diabetes Mother     alive @ 46  . Heart disease Father  died of MI @ 81  . Osteoporosis Maternal Grandmother   . Colon cancer Maternal Grandfather   . Breast cancer Neg Hx   . Ovarian cancer Neg Hx   . Other      3 older brothers, alive and well.    Social History    Social History   Social History  . Marital Status: Married    Spouse Name: N/A  . Number of Children: N/A  . Years of Education: N/A   Occupational History  . Not on file.   Social History Main Topics  . Smoking status: Current Every Day Smoker -- 1.00 packs/day for 30  years  . Smokeless tobacco: Not on file  . Alcohol Use: 0.0 oz/week    0 Standard drinks or equivalent per week     Comment: 1-2 drinks/week.  . Drug Use: No  . Sexual Activity: Yes   Other Topics Concern  . Not on file   Social History Narrative   Lives in [place] with her husband.  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     Review of Systems    General:  No chills, fever, night sweats or weight changes.  Cardiovascular:   positive chest pain  and dyspnea on exertion. No edema, orthopnea, palpitations, paroxysmal nocturnal dyspnea. Dermatological: No rash, lesions/masses Respiratory: No cough,  Positive chronic dyspnea  On exertion. Urologic: No hematuria, dysuria Abdominal:   No nausea, vomiting, diarrhea, bright red blood per rectum, melena, or hematemesis Neurologic:  No visual changes, wkns, changes in mental status. All other systems reviewed and are otherwise negative except as noted above.  Physical Exam    VS:  BP 122/90 mmHg  Pulse 80  Ht 5' 6"  (1.676 m)  Wt 205 lb 1.9 oz (93.042 kg)  BMI 33.12 kg/m2 , BMI Body mass index is 33.12 kg/(m^2). GEN: Well nourished, well developed, in no acute distress. HEENT: normal. Neck: Supple, no JVD, carotid bruits, or masses. Cardiac: RRR, no murmurs, rubs, or gallops. No clubbing, cyanosis, edema.  Radials/DP/PT 2+ and equal bilaterally.  Respiratory:  Respirations regular and unlabored, clear to auscultation bilaterally. GI: Soft, nontender, nondistended, BS + x 4. MS: no deformity or atrophy. Skin: warm and dry, no rash. Neuro:  Strength and sensation are intact. Psych: Normal affect.  Accessory Clinical Findings    ECG -  Regular sinus rhythm, 84, no acute ST or T changes.  Assessment & Plan   1.   Precordial chest pain: patient presents with a 1-2 month history of intermittent predominantly resting chest tightness that occurs most frequently when she lies down for bed at  night. She does have mild associated dyspnea. She thinks she may also experience similar tightness with ambulation but isn't sure. Risk factors for coronary artery disease include family history , with her father dying of a myocardial infarction at age 12 ,  Tobacco abuse, and hypertension.  As she has a nl ECG and is relatively low risk, I will arrange for an exercise treadmill test to rule out ischemia. Provided that this is normal, I would not pursue additional cardiac evaluation for her symptoms.   2. Dyspnea on exertion: this appears to be chronic and may be attributable to her smoking history. She has no evidence of volume overload on exam or significant murmurs. Assess exercise tolerance with treadmill testing. She has significantly poor exercise tolerance or an abnormal ECG with exercise, I would get an echocardiogram. Otherwise, if  treadmill study is normal, I would recommend regular exercise and weight loss.   3. Essential hypertension: Stable on lisinopril therapy.   4. Palpitations: Patient notes occasional brief palpitations current acute times per week. She has not had any sustained tachycardias. Recent ECG showed a PVC and I suspect this may be what she is feeling. We discussed the role of event monitoring and collectively decided that this does not appear to be appropriate at this time. If she were to have worsening of palpitations, she could consider resumption of metoprolol, which she had been on  In the past. Labs were sent from her primary care provider and were normal , with normal thyroid studies as well. These were from 03/03/2015. I would not repeat labs at this time.   5. Overweight: Regular exercise recommended.   6. Tobacco abuse: Complete cessation advised. She is current smoking a pack per day. She knows that she needs to quit but isn't sure that she is ready.   7. Disposition: Follow-up exercise treadmill test as outlined above. Follow-up when necessary pending  results.  [name], NP 09/26/2015, 2:20 PM

## 2015-09-26 NOTE — Patient Instructions (Addendum)
Medication Instructions:  Please continue your current medications  Labwork: None  Testing/Procedures: Your physician has requested that you have an exercise tolerance test.   Date & time: ______________________________________  Follow-Up: Call or return to clinic prn if these symptoms worsen or fail to improve as anticipated.  If you need a refill on your cardiac medications before your next appointment, please call your pharmacy.  Exercise Stress Electrocardiogram An exercise stress electrocardiogram is a test that is done to evaluate the blood supply to your heart. This test may also be called exercise stress electrocardiography. The test is done while you are walking on a treadmill. The goal of this test is to raise your heart rate. This test is done to find areas of poor blood flow to the heart by determining the extent of coronary artery disease (CAD).   CAD is defined as narrowing in one or more heart (coronary) arteries of more than 70%. If you have an abnormal test result, this may mean that you are not getting adequate blood flow to your heart during exercise. Additional testing may be needed to understand why your test was abnormal. LET [facility] CARE PROVIDER KNOW ABOUT:   Any allergies you have.  All medicines you are taking, including vitamins, herbs, eye drops, creams, and over-the-counter medicines.  Previous problems you or members of your family have had with the use of anesthetics.  Any blood disorders you have.  Previous surgeries you have had.  Medical conditions you have.  Possibility of pregnancy, if this applies. RISKS AND COMPLICATIONS Generally, this is a safe procedure. However, as with any procedure, complications can occur. Possible complications can include:  Pain or pressure in the following areas:  Chest.  Jaw or neck.  Between your shoulder blades.  Radiating down your left arm.  Dizziness or light-headedness.  Shortness of  breath.  Increased or irregular heartbeats.  Nausea or vomiting.  Heart attack (rare). BEFORE THE PROCEDURE  Avoid all forms of caffeine 24 hours before your test or as directed by your health care provider. This includes coffee, tea (even decaffeinated tea), caffeinated sodas, chocolate, cocoa, and certain pain medicines.  Follow your health care provider's instructions regarding eating and drinking before the test.  Take your medicines as directed at regular times with water unless instructed otherwise. Exceptions may include:  If you have diabetes, ask how you are to take your insulin or pills. It is common to adjust insulin dosing the morning of the test.  If you are taking beta-blocker medicines, it is important to talk to your health care provider about these medicines well before the date of your test. Taking beta-blocker medicines may interfere with the test. In some cases, these medicines need to be changed or stopped 24 hours or more before the test.  If you wear a nitroglycerin patch, it may need to be removed prior to the test. Ask your health care provider if the patch should be removed before the test.  If you use an inhaler for any breathing condition, bring it with you to the test.  If you are an outpatient, bring a snack so you can eat right after the stress phase of the test.  Do not smoke for 4 hours prior to the test or as directed by your health care provider.  Do not apply lotions, powders, creams, or oils on your chest prior to the test.  Wear loose-fitting clothes and comfortable shoes for the test. This test involves walking on a treadmill.  PROCEDURE  Multiple patches (electrodes) will be put on your chest. If needed, small areas of your chest may have to be shaved to get better contact with the electrodes. Once the electrodes are attached to your body, multiple wires will be attached to the electrodes and your heart rate will be monitored.  Your heart will  be monitored both at rest and while exercising.  You will walk on a treadmill. The treadmill will be started at a slow pace. The treadmill speed and incline will gradually be increased to raise your heart rate. AFTER THE PROCEDURE  Your heart rate and blood pressure will be monitored after the test.  You may return to your normal schedule including diet, activities, and medicines, unless your health care provider tells you otherwise.   This information is not intended to replace advice given to you by your health care provider. Make sure you discuss any questions you have with your health care provider.   Document Released: 08/27/2000 Document Revised: 09/04/2013 Document Reviewed: 05/07/2013 Elsevier Interactive Patient Education [employer].

## 2015-10-01 ENCOUNTER — Ambulatory Visit (INDEPENDENT_AMBULATORY_CARE_PROVIDER_SITE_OTHER): Payer: BLUE CROSS/BLUE SHIELD

## 2015-10-01 DIAGNOSIS — I10 Essential (primary) hypertension: Secondary | ICD-10-CM | POA: Diagnosis not present

## 2015-10-01 DIAGNOSIS — R072 Precordial pain: Secondary | ICD-10-CM

## 2015-10-02 LAB — EXERCISE TOLERANCE TEST
CSEPEDS: 11 s
CSEPEW: 8.8 METS
CSEPHR: 86 %
Exercise duration (min): 7 min
MPHR: 172 {beats}/min
Peak HR: 148 {beats}/min
Rest HR: 96 {beats}/min

## 2015-12-08 ENCOUNTER — Other Ambulatory Visit: Payer: Self-pay

## 2015-12-08 DIAGNOSIS — Z1231 Encounter for screening mammogram for malignant neoplasm of breast: Secondary | ICD-10-CM

## 2015-12-24 ENCOUNTER — Ambulatory Visit: Payer: BLUE CROSS/BLUE SHIELD

## 2016-01-07 ENCOUNTER — Ambulatory Visit
Admission: RE | Admit: 2016-01-07 | Discharge: 2016-01-07 | Disposition: A | Discharge status: Home / Self Care | Payer: BLUE CROSS/BLUE SHIELD | Source: Ambulatory Visit

## 2016-01-07 DIAGNOSIS — Z1231 Encounter for screening mammogram for malignant neoplasm of breast: Secondary | ICD-10-CM

## 2016-01-12 ENCOUNTER — Other Ambulatory Visit: Payer: Self-pay | Admitting: Family Medicine

## 2016-01-12 DIAGNOSIS — R928 Other abnormal and inconclusive findings on diagnostic imaging of breast: Secondary | ICD-10-CM

## 2016-01-15 ENCOUNTER — Ambulatory Visit
Admission: RE | Admit: 2016-01-15 | Discharge: 2016-01-15 | Disposition: A | Discharge status: Home / Self Care | Payer: BLUE CROSS/BLUE SHIELD | Source: Ambulatory Visit | Attending: Family Medicine | Admitting: Family Medicine

## 2016-01-15 ENCOUNTER — Other Ambulatory Visit: Payer: Self-pay | Admitting: Family Medicine

## 2016-01-15 DIAGNOSIS — R928 Other abnormal and inconclusive findings on diagnostic imaging of breast: Secondary | ICD-10-CM

## 2016-01-20 ENCOUNTER — Other Ambulatory Visit: Payer: Self-pay | Admitting: Family Medicine

## 2016-01-20 ENCOUNTER — Ambulatory Visit: Payer: BLUE CROSS/BLUE SHIELD

## 2016-01-20 ENCOUNTER — Ambulatory Visit
Admission: RE | Admit: 2016-01-20 | Discharge: 2016-01-20 | Disposition: A | Discharge status: Home / Self Care | Payer: Self-pay | Source: Ambulatory Visit | Attending: Family Medicine | Admitting: Family Medicine

## 2016-01-20 DIAGNOSIS — R928 Other abnormal and inconclusive findings on diagnostic imaging of breast: Secondary | ICD-10-CM

## 2016-01-21 ENCOUNTER — Encounter: Payer: BLUE CROSS/BLUE SHIELD | Admitting: Obstetrics and Gynecology

## 2016-02-18 ENCOUNTER — Encounter: Payer: BLUE CROSS/BLUE SHIELD | Admitting: Obstetrics and Gynecology

## 2016-10-23 ENCOUNTER — Emergency Department: Payer: BLUE CROSS/BLUE SHIELD

## 2016-10-23 ENCOUNTER — Emergency Department
Admission: EM | Admit: 2016-10-23 | Discharge: 2016-10-24 | Disposition: A | Discharge status: Home / Self Care | Payer: BLUE CROSS/BLUE SHIELD | Attending: Emergency Medicine | Admitting: Emergency Medicine

## 2016-10-23 ENCOUNTER — Encounter: Payer: Self-pay | Admitting: Emergency Medicine

## 2016-10-23 DIAGNOSIS — R42 Dizziness and giddiness: Secondary | ICD-10-CM | POA: Diagnosis not present

## 2016-10-23 DIAGNOSIS — R05 Cough: Secondary | ICD-10-CM | POA: Diagnosis not present

## 2016-10-23 DIAGNOSIS — I10 Essential (primary) hypertension: Secondary | ICD-10-CM | POA: Diagnosis not present

## 2016-10-23 DIAGNOSIS — F17200 Nicotine dependence, unspecified, uncomplicated: Secondary | ICD-10-CM | POA: Diagnosis not present

## 2016-10-23 DIAGNOSIS — Z79899 Other long term (current) drug therapy: Secondary | ICD-10-CM | POA: Insufficient documentation

## 2016-10-23 DIAGNOSIS — R091 Pleurisy: Secondary | ICD-10-CM

## 2016-10-23 DIAGNOSIS — R0789 Other chest pain: Secondary | ICD-10-CM | POA: Diagnosis not present

## 2016-10-23 MED ORDER — IBUPROFEN 600 MG PO TABS
600.0000 mg | ORAL_TABLET | Freq: Once | ORAL | Status: AC
Start: 2016-10-23 — End: 2016-10-23
  Administered 2016-10-23: 600 mg via ORAL
  Filled 2016-10-23: qty 1

## 2016-10-23 MED ORDER — TRAMADOL HCL 50 MG PO TABS
50.0000 mg | ORAL_TABLET | Freq: Once | ORAL | Status: AC
  Administered 2016-10-23: 50 mg via ORAL
  Filled 2016-10-23: qty 1

## 2016-10-23 MED ORDER — BENZONATATE 100 MG PO CAPS
100.0000 mg | ORAL_CAPSULE | Freq: Once | ORAL | Status: AC
  Administered 2016-10-23: 100 mg via ORAL
  Filled 2016-10-23: qty 1

## 2016-10-23 MED ORDER — LIDOCAINE 5 % EX PTCH
1.0000 | MEDICATED_PATCH | CUTANEOUS | Status: DC
  Administered 2016-10-23: 1 via TRANSDERMAL
  Filled 2016-10-23: qty 1

## 2016-10-23 NOTE — ED Triage Notes (Signed)
Pt states has had a cough for one month. Pt states tonight while putting something in fridge she coughed and then began to experience left lower lateral rib pain and felt a "pop". Pt is self splinting in triage. Pt states hurts with coughing and moving.

## 2016-10-24 MED ORDER — LIDOCAINE 5 % EX PTCH: 1.0000 | MEDICATED_PATCH | Freq: Two times a day (BID) | CUTANEOUS | 0 refills | Status: DC

## 2016-10-24 MED ORDER — TRAMADOL HCL 50 MG PO TABS: 50.0000 mg | ORAL_TABLET | Freq: Four times a day (QID) | ORAL | 0 refills | Status: DC | PRN

## 2016-10-24 MED ORDER — BENZONATATE 100 MG PO CAPS: 100.0000 mg | ORAL_CAPSULE | Freq: Four times a day (QID) | ORAL | 0 refills | Status: DC | PRN

## 2016-10-24 NOTE — ED Provider Notes (Signed)
[facility] Emergency Department Provider Note   ____________________________________________   First MD Initiated Contact with Patient 10/23/16 2307     (approximate)  I have reviewed the triage vital signs and the nursing notes.   HISTORY  Chief Complaint Chest Pain    HPI Gina Sanchez is a 51 y.o. female who comes into the hospital today with some left-sided lateral chest pain. The patient reports that she was coughing and felt something pop in her rib area. It was excruciating when it occurred and it was about 90 minutes prior to her arrival. The patient did not take anything for pain and she has not been coughing anything up. She reports that she has been coughing for some time and has been on prednisone. She also reports that she finished levofloxacin yesterday. The patient reports that she has been having some pain to her left side for about 2-3 days but this all became worse after the coughing earlier. She reports that the pain is a 10 out of 10 when she's moving but when she staying still at about a 3-4 out of 10 in intensity. She reports that it is uncomfortable and she scared to take a breath in because of the pain. She reports it also hurts when she coughs. The patient denies any vomiting or abdominal pain. She did have some dizziness with this pain. The patient is here today for evaluation.   Past Medical History:  Diagnosis Date  . Abnormal Pap smear of cervix   . Anxiety   . Depression   . Essential hypertension   . Palpitations   . Precordial chest pain     Patient Active Problem List   Diagnosis Date Noted  . Essential hypertension   . Hypercholesteremia   . Precordial chest pain   . Palpitations     Past Surgical History:  Procedure Laterality Date  . CERVICAL BIOPSY  W/ LOOP ELECTRODE EXCISION    . COLONOSCOPY WITH PROPOFOL N/A 07/03/2015   Procedure: COLONOSCOPY WITH PROPOFOL;  Surgeon: Hulen Luster, MD;  Location: [facility]  ENDOSCOPY;  Service: Gastroenterology;  Laterality: N/A;  . ESOPHAGOGASTRODUODENOSCOPY (EGD) WITH PROPOFOL N/A 07/03/2015   Procedure: ESOPHAGOGASTRODUODENOSCOPY (EGD) WITH PROPOFOL;  Surgeon: Hulen Luster, MD;  Location: [facility] ENDOSCOPY;  Service: Gastroenterology;  Laterality: N/A;    Prior to Admission medications   Medication Sig Start Date End Date Taking? Authorizing Provider  ALPRAZolam Duanne Moron) 0.5 MG tablet Take 0.5 mg by mouth 2 (two) times daily as needed. For anxiety 11/29/13   Historical Provider, MD  benzonatate (TESSALON PERLES) 100 MG capsule Take 1 capsule (100 mg total) by mouth every 6 (six) hours as needed for cough. 10/24/16   Loney Hering, MD  levonorgestrel (MIRENA, 52 MG,) 20 MCG/24HR IUD 1 Intra Uterine Device (1 each total) by Intrauterine route once. 04/24/15   Rubie Maid, MD  lidocaine (LIDODERM) 5 % Place 1 patch onto the skin every 12 (twelve) hours. Remove & Discard patch within 12 hours or as directed by MD 10/24/16 10/24/17  Loney Hering, MD  lisinopril (PRINIVIL,ZESTRIL) 20 MG tablet Take 25 mg by mouth daily.    Historical Provider, MD  traMADol (ULTRAM) 50 MG tablet Take 1 tablet (50 mg total) by mouth every 6 (six) hours as needed. 10/24/16   Loney Hering, MD    Allergies Factive [gemifloxacin]  Family History  Problem Relation Age of Onset  . Diabetes Mother     alive @ 52  .  Heart disease Father     died of MI @ 82  . Osteoporosis Maternal Grandmother   . Colon cancer Maternal Grandfather   . Breast cancer Neg Hx   . Ovarian cancer Neg Hx   . Other      3 older brothers, alive and well.    Social History Social History  Substance Use Topics  . Smoking status: Current Every Day Smoker    Packs/day: 1.00    Years: 30.00  . Smokeless tobacco: Not on file  . Alcohol use 0.0 oz/week     Comment: 1-2 drinks/week.    Review of Systems Constitutional: No fever/chills Eyes: No visual changes. ENT: No sore throat. Cardiovascular:   chest pain. Respiratory: Cough Gastrointestinal: No abdominal pain.  No nausea, no vomiting.  No diarrhea.  No constipation. Genitourinary: Negative for dysuria. Musculoskeletal: Negative for back pain. Skin: Negative for rash. Neurological: Dizziness  10-point ROS otherwise negative.  ____________________________________________   PHYSICAL EXAM:  VITAL SIGNS: ED Triage Vitals  Enc Vitals Group     BP 10/23/16 2145 (!) 158/103     Pulse Rate 10/23/16 2145 100     Resp 10/23/16 2145 18     Temp 10/23/16 2145 98.2 F (36.8 C)     Temp Source 10/23/16 2145 Oral     SpO2 10/23/16 2145 100 %     Weight 10/23/16 2146 209 lb (94.8 kg)     Height 10/23/16 2146 5' 6"  (1.676 m)     Head Circumference --      Peak Flow --      Pain Score 10/23/16 2146 3     Pain Loc --      Pain Edu? --      Excl. in [place]? --     Constitutional: Alert and oriented. Well appearing and in Moderate distress. Eyes: Conjunctivae are normal. PERRL. EOMI. Head: Atraumatic. Nose: No congestion/rhinnorhea. Mouth/Throat: Mucous membranes are moist.  Oropharynx non-erythematous. Cardiovascular: Normal rate, regular rhythm. Grossly normal heart sounds.  Good peripheral circulation. Respiratory: Normal respiratory effort.  No retractions. Lungs CTAB. Left lateral chest tenderness to palpation with no bruising. Gastrointestinal: Soft and nontender. No distention. Positive bowel sounds Musculoskeletal: No lower extremity tenderness nor edema.  . Neurologic:  Normal speech and language.  Skin:  Skin is warm, dry and intact.  Psychiatric: Mood and affect are normal.   ____________________________________________   LABS (all labs ordered are listed, but only abnormal results are displayed)  Labs Reviewed - No data to display ____________________________________________  EKG  ED ECG REPORT I, Loney Hering, the attending physician, personally viewed and interpreted this ECG.   Date: 10/23/2016   EKG Time: 2150  Rate: 107  Rhythm: sinus tachycardia  Axis: normal  Intervals:none  ST&T Change: none  ____________________________________________  RADIOLOGY  CXR ____________________________________________   PROCEDURES  Procedure(s) performed: None  Procedures  Critical Care performed: No  ____________________________________________   INITIAL IMPRESSION / ASSESSMENT AND PLAN / ED COURSE  Pertinent labs & imaging results that were available during my care of the patient were reviewed by me and considered in my medical decision making (see chart for details).  This is a 51 year old female who comes into the hospital today with some left-sided chest pain after feeling a pop with coughing. The patient has tenderness to palpation it's worse when she is moving and I feel that this may be some musculoskeletal pain. The patient did receive a chest x-ray which showed some atelectasis with no  distinct pneumonia. I did place a Lidoderm patch of the patient's chest and give her some tramadol, benzonatate and ibuprofen. After going back in to reassess the patient she reports that she felt better. I feel the patient may have some pleurisy and some musculoskeletal pain with rib contusion from coughing. I do not feel the patient needs any further evaluation. She has been on some recent prednisone as well as antibiotics. The patient's lungs are clear. I will discharge the patient home to have her follow back up with her primary care physician. The patient has no further complaints or concerns at this time. She will be discharged home.  Clinical Course as of Oct 24 18  Sat Oct 23, 2016  2303 Minimal left basilar atelectasis DG Chest 2 View [AW]    Clinical Course User Index [AW] Loney Hering, MD     ____________________________________________   FINAL CLINICAL IMPRESSION(S) / ED DIAGNOSES  Final diagnoses:  Chest wall pain  Pleurisy      NEW MEDICATIONS STARTED DURING  THIS VISIT:  New Prescriptions   BENZONATATE (TESSALON PERLES) 100 MG CAPSULE    Take 1 capsule (100 mg total) by mouth every 6 (six) hours as needed for cough.   LIDOCAINE (LIDODERM) 5 %    Place 1 patch onto the skin every 12 (twelve) hours. Remove & Discard patch within 12 hours or as directed by MD   TRAMADOL (ULTRAM) 50 MG TABLET    Take 1 tablet (50 mg total) by mouth every 6 (six) hours as needed.     Note:  This document was prepared using Dragon voice recognition software and may include unintentional dictation errors.    Loney Hering, MD 10/24/16 0020

## 2016-10-24 NOTE — Discharge Instructions (Signed)
Please follow-up with your primary care physician but return with any worsening symptoms.

## 2017-03-25 ENCOUNTER — Emergency Department (HOSPITAL_COMMUNITY): Payer: BLUE CROSS/BLUE SHIELD

## 2017-03-25 ENCOUNTER — Encounter (HOSPITAL_COMMUNITY): Payer: Self-pay | Admitting: Emergency Medicine

## 2017-03-25 ENCOUNTER — Inpatient Hospital Stay (HOSPITAL_COMMUNITY)
Admission: EM | Admit: 2017-03-25 | Discharge: 2017-03-29 | DRG: 287 | Disposition: A | Discharge status: Home / Self Care | Payer: BLUE CROSS/BLUE SHIELD | Attending: Cardiology | Admitting: Cardiology

## 2017-03-25 DIAGNOSIS — Z8249 Family history of ischemic heart disease and other diseases of the circulatory system: Secondary | ICD-10-CM

## 2017-03-25 DIAGNOSIS — F419 Anxiety disorder, unspecified: Secondary | ICD-10-CM | POA: Diagnosis present

## 2017-03-25 DIAGNOSIS — F17210 Nicotine dependence, cigarettes, uncomplicated: Secondary | ICD-10-CM | POA: Diagnosis present

## 2017-03-25 DIAGNOSIS — I252 Old myocardial infarction: Secondary | ICD-10-CM

## 2017-03-25 DIAGNOSIS — R079 Chest pain, unspecified: Secondary | ICD-10-CM | POA: Diagnosis not present

## 2017-03-25 DIAGNOSIS — I214 Non-ST elevation (NSTEMI) myocardial infarction: Secondary | ICD-10-CM | POA: Diagnosis not present

## 2017-03-25 DIAGNOSIS — Z6832 Body mass index (BMI) 32.0-32.9, adult: Secondary | ICD-10-CM | POA: Diagnosis not present

## 2017-03-25 DIAGNOSIS — I10 Essential (primary) hypertension: Secondary | ICD-10-CM | POA: Diagnosis present

## 2017-03-25 DIAGNOSIS — F329 Major depressive disorder, single episode, unspecified: Secondary | ICD-10-CM | POA: Diagnosis present

## 2017-03-25 DIAGNOSIS — Z720 Tobacco use: Secondary | ICD-10-CM | POA: Diagnosis not present

## 2017-03-25 DIAGNOSIS — E669 Obesity, unspecified: Secondary | ICD-10-CM | POA: Diagnosis present

## 2017-03-25 DIAGNOSIS — E785 Hyperlipidemia, unspecified: Secondary | ICD-10-CM | POA: Diagnosis present

## 2017-03-25 DIAGNOSIS — E7800 Pure hypercholesterolemia, unspecified: Secondary | ICD-10-CM | POA: Diagnosis present

## 2017-03-25 DIAGNOSIS — Z79899 Other long term (current) drug therapy: Secondary | ICD-10-CM

## 2017-03-25 DIAGNOSIS — I200 Unstable angina: Secondary | ICD-10-CM | POA: Diagnosis not present

## 2017-03-25 DIAGNOSIS — R072 Precordial pain: Secondary | ICD-10-CM | POA: Diagnosis not present

## 2017-03-25 LAB — I-STAT TROPONIN, ED: Troponin i, poc: 0 ng/mL (ref 0.00–0.08)

## 2017-03-25 LAB — BASIC METABOLIC PANEL
ANION GAP: 9 (ref 5–15)
BUN: 8 mg/dL (ref 6–20)
CO2: 23 mmol/L (ref 22–32)
Calcium: 9.7 mg/dL (ref 8.9–10.3)
Chloride: 107 mmol/L (ref 101–111)
Creatinine, Ser: 0.68 mg/dL (ref 0.44–1.00)
GFR calc Af Amer: >60 mL/min (ref >60)
GLUCOSE: 122 mg/dL — AB (ref 65–99)
POTASSIUM: 3.9 mmol/L (ref 3.5–5.1)
SODIUM: 139 mmol/L (ref 135–145)

## 2017-03-25 LAB — CBC
HEMATOCRIT: 42.9 % (ref 36.0–46.0)
HEMOGLOBIN: 14.6 g/dL (ref 12.0–15.0)
MCH: 28.8 pg (ref 26.0–34.0)
MCHC: 34 g/dL (ref 30.0–36.0)
MCV: 84.6 fL (ref 78.0–100.0)
Platelets: 328 10*3/uL (ref 150–400)
RBC: 5.07 MIL/uL (ref 3.87–5.11)
RDW: 14.6 % (ref 11.5–15.5)
WBC: 12.7 10*3/uL — AB (ref 4.0–10.5)

## 2017-03-25 LAB — TROPONIN I: TROPONIN I: 0.03 ng/mL — AB (ref <0.03)

## 2017-03-25 LAB — D-DIMER, QUANTITATIVE (NOT AT ARMC): D DIMER QUANT: 0.32 ug{FEU}/mL (ref 0.00–0.50)

## 2017-03-25 MED ORDER — ALPRAZOLAM 0.5 MG PO TABS
0.5000 mg | ORAL_TABLET | Freq: Two times a day (BID) | ORAL | Status: DC | PRN
  Administered 2017-03-26 – 2017-03-28 (×2): 0.5 mg via ORAL
  Filled 2017-03-25 (×2): qty 1

## 2017-03-25 MED ORDER — HEPARIN BOLUS VIA INFUSION
4000.0000 [IU] | Freq: Once | INTRAVENOUS | Status: AC
  Administered 2017-03-26: 4000 [IU] via INTRAVENOUS
  Filled 2017-03-25: qty 4000

## 2017-03-25 MED ORDER — NITROGLYCERIN 0.4 MG SL SUBL: 0.4000 mg | SUBLINGUAL_TABLET | SUBLINGUAL | Status: DC | PRN

## 2017-03-25 MED ORDER — LISINOPRIL 10 MG PO TABS
10.0000 mg | ORAL_TABLET | Freq: Every day | ORAL | Status: DC
  Administered 2017-03-26 – 2017-03-29 (×4): 10 mg via ORAL
  Filled 2017-03-25 (×4): qty 1

## 2017-03-25 MED ORDER — ONDANSETRON HCL 4 MG/2ML IJ SOLN: 4.0000 mg | Freq: Four times a day (QID) | INTRAMUSCULAR | Status: DC | PRN

## 2017-03-25 MED ORDER — ASPIRIN EC 81 MG PO TBEC
81.0000 mg | DELAYED_RELEASE_TABLET | Freq: Every day | ORAL | Status: DC
  Administered 2017-03-26 – 2017-03-29 (×3): 81 mg via ORAL
  Filled 2017-03-25 (×3): qty 1

## 2017-03-25 MED ORDER — ASPIRIN 81 MG PO CHEW
324.0000 mg | CHEWABLE_TABLET | Freq: Once | ORAL | Status: AC
  Administered 2017-03-25: 243 mg via ORAL
  Filled 2017-03-25: qty 4

## 2017-03-25 MED ORDER — ATORVASTATIN CALCIUM 40 MG PO TABS: 40.0000 mg | ORAL_TABLET | Freq: Every day | ORAL | Status: DC

## 2017-03-25 MED ORDER — ATORVASTATIN CALCIUM 40 MG PO TABS
40.0000 mg | ORAL_TABLET | Freq: Every day | ORAL | Status: DC
  Administered 2017-03-25 – 2017-03-28 (×4): 40 mg via ORAL
  Filled 2017-03-25 (×4): qty 1

## 2017-03-25 MED ORDER — HEPARIN (PORCINE) IN NACL 100-0.45 UNIT/ML-% IJ SOLN
1650.0000 [IU]/h | INTRAMUSCULAR | Status: DC
  Administered 2017-03-26: 1000 [IU]/h via INTRAVENOUS
  Administered 2017-03-27 (×2): 1650 [IU]/h via INTRAVENOUS
  Filled 2017-03-25 (×6): qty 250

## 2017-03-25 MED ORDER — ACETAMINOPHEN 325 MG PO TABS
650.0000 mg | ORAL_TABLET | ORAL | Status: DC | PRN
  Administered 2017-03-25: 650 mg via ORAL
  Filled 2017-03-25: qty 2

## 2017-03-25 MED ORDER — BUPROPION HCL ER (XL) 150 MG PO TB24
300.0000 mg | ORAL_TABLET | Freq: Every day | ORAL | Status: DC
  Administered 2017-03-26 – 2017-03-29 (×4): 300 mg via ORAL
  Filled 2017-03-25 (×4): qty 2

## 2017-03-25 MED ORDER — METOPROLOL TARTRATE 25 MG PO TABS
25.0000 mg | ORAL_TABLET | Freq: Two times a day (BID) | ORAL | Status: DC
  Administered 2017-03-25 – 2017-03-29 (×8): 25 mg via ORAL
  Filled 2017-03-25 (×8): qty 1

## 2017-03-25 NOTE — ED Notes (Signed)
Attempted to gain IV access, twice, without success.

## 2017-03-25 NOTE — ED Notes (Signed)
ED Provider at bedside. 

## 2017-03-25 NOTE — Progress Notes (Addendum)
ANTICOAGULATION CONSULT NOTE - Initial Consult  Pharmacy Consult for heparin Indication: chest pain/ACS  Allergies  Allergen Reactions  . Factive [Gemifloxacin] Rash    Patient Measurements: Height: 5' 7"  (170.2 cm) Weight: 211 lb 4.8 oz (95.8 kg) IBW/kg (Calculated) : 61.6 Heparin Dosing Weight: 82.7 kg  Assessment: 51 yo F presents on 7/13 with CP. Pharmacy consulted to start heparin for ACS CBC stable  Goal of Therapy:  Heparin level 0.3-0.7 units/ml Monitor platelets by anticoagulation protocol: Yes   Plan:  Give heparin 4,000 unit bolus  Start heparin gtt at 1,000 units/hr Monitor daily heparin level, CBC, s/s of bleed   ADDENDUM:  First heparin level is low at 0.1. No issues documented.  Plan: Increase heparin gtt to 1,250 units/hr Monitor daily heparin level, CBC, s/s of bleed   Elenor Quinones, PharmD, [facility] Clinical Pharmacist Pager [phone] 03/25/2017 11:40 PM

## 2017-03-25 NOTE — ED Notes (Addendum)
MD Ralene Bathe at the bedside. Pt returned from [place]

## 2017-03-25 NOTE — H&P (Signed)
Patient ID: Gina Sanchez MRN: [id number], DOB/AGE: 10-03-1965   Admit date: 03/25/2017   Primary Physician: Lorelee Market, MD Primary Cardiologist: New (seen by [name], NP in 2017) Dr. Marlou Porch   Pt. Profile:  51 y/o moderately obese female with h/o HTN, tobacco use and family h/o CAD (father w/ fatal MI at 49), normal ETT 09/2015, presenting to the ED with complaint of chest pain with abnormal troponin.   Problem List  Past Medical History:  Diagnosis Date  . Abnormal Pap smear of cervix   . Anxiety   . Depression   . Essential hypertension   . Palpitations   . Precordial chest pain     Past Surgical History:  Procedure Laterality Date  . CERVICAL BIOPSY  W/ LOOP ELECTRODE EXCISION    . COLONOSCOPY WITH PROPOFOL N/A 07/03/2015   Procedure: COLONOSCOPY WITH PROPOFOL;  Surgeon: Hulen Luster, MD;  Location: [facility] ENDOSCOPY;  Service: Gastroenterology;  Laterality: N/A;  . ESOPHAGOGASTRODUODENOSCOPY (EGD) WITH PROPOFOL N/A 07/03/2015   Procedure: ESOPHAGOGASTRODUODENOSCOPY (EGD) WITH PROPOFOL;  Surgeon: Hulen Luster, MD;  Location: [facility] ENDOSCOPY;  Service: Gastroenterology;  Laterality: N/A;     Allergies  Allergies  Allergen Reactions  . Factive [Gemifloxacin] Rash    HPI  51 y/o moderately obese female with h/o HTN, tobacco use and family h/o CAD (father w/ fatal MI at 98), normal ETT 09/2015, presenting to the ED with complaint of chest pain with abnormal troponin.   She has been a smoker for 20 years. She smokes a pack per day. She reports that she is on medications for hypertension. She denies any prior history of hyperlipidemia or diabetes. She was seen in our office as a new patient in January 2017 for atypical chest pain and underwent an exercise tolerance test which was a normal study. She has not been seen back in our clinic since that time.  She presents to the [name] emergency department with complaints of chest pain. She was in her usual  state of health until earlier this morning. She woke up with substernal chest pressure/heaviness radiating to her back. See also felt nauseated however no vomiting. She was slightly diaphoretic. No significant dyspnea. The discomfort was rated 8 out of 10 and was fairly constant. She does not recall it worsening with exertion when she was getting ready for work. She works at a [employer] office and had her blood pressure checked and was told that it was elevated. She took an extra blood pressure pill. Her chest pain improved slightly however throughout the day she had recurrent chest pain prompting presentation to the emergency department. She also tried antacids w/o relief.   Point of care troponin was negative however actual lab troponin was slightly abnormal at 0.03. D-Dimer negative. CBC shows slight leukocytosis with a white blood count of 12.7. BMP is unremarkable. She is afebrile. CXr negative. EKG shows normal sinus rhythm without any ischemic abnormalities.   Home Medications  Prior to Admission medications   Medication Sig Start Date End Date Taking? Authorizing Provider  ALPRAZolam Duanne Moron) 0.5 MG tablet Take 0.5 mg by mouth 2 (two) times daily as needed for anxiety.  11/29/13  Yes [provider]  Aspirin-Salicylamide-Caffeine (BC HEADACHE POWDER PO) Take 1 packet by mouth every 6 (six) hours as needed (for headaches).   Yes [provider]  buPROPion (WELLBUTRIN XL) 300 MG 24 hr tablet Take 300 mg by mouth daily. 02/11/17  Yes [provider]  levonorgestrel (  MIRENA, 52 MG,) 20 MCG/24HR IUD 1 Intra Uterine Device (1 each total) by Intrauterine route once. 04/24/15  Yes Rubie Maid, MD  lisinopril (PRINIVIL,ZESTRIL) 10 MG tablet Take 10 mg by mouth daily. 03/15/17  Yes [provider]  benzonatate (TESSALON PERLES) 100 MG capsule Take 1 capsule (100 mg total) by mouth every 6 (six) hours as needed for cough. Patient not taking: Reported on 03/25/2017 10/24/16    Loney Hering, MD  lidocaine (LIDODERM) 5 % Place 1 patch onto the skin every 12 (twelve) hours. Remove & Discard patch within 12 hours or as directed by MD Patient not taking: Reported on 03/25/2017 10/24/16 10/24/17  Loney Hering, MD  traMADol (ULTRAM) 50 MG tablet Take 1 tablet (50 mg total) by mouth every 6 (six) hours as needed. Patient not taking: Reported on 03/25/2017 10/24/16   Loney Hering, MD    Family History  Family History  Problem Relation Age of Onset  . Diabetes Mother        alive @ 78  . Heart disease Father        died of MI @ 1  . Osteoporosis Maternal Grandmother   . Colon cancer Maternal Grandfather   . Other Unknown        3 older brothers, alive and well.  . Breast cancer Neg Hx   . Ovarian cancer Neg Hx     Social History  Social History   Social History  . Marital status: Married    Spouse name: N/A  . Number of children: N/A  . Years of education: N/A   Occupational History  . Not on file.   Social History Main Topics  . Smoking status: Current Every Day Smoker    Packs/day: 1.00    Years: 30.00  . Smokeless tobacco: Never Used  . Alcohol use 0.0 oz/week     Comment: 1-2 drinks/week.  . Drug use: No  . Sexual activity: Yes   Other Topics Concern  . Not on file   Social History Narrative   Lives in [place] with her husband.  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     Review of Systems General:  No chills, fever, night sweats or weight changes.  Cardiovascular:  + chest pain, - dyspnea on exertion, edema, orthopnea, palpitations, paroxysmal nocturnal dyspnea. Dermatological: No rash, lesions/masses Respiratory: No cough, dyspnea Urologic: No hematuria, dysuria Abdominal:   No nausea, vomiting, diarrhea, bright red blood per rectum, melena, or hematemesis Neurologic:  No visual changes, wkns, changes in mental status. All other systems reviewed and are otherwise negative  except as noted above.  Physical Exam  Blood pressure 135/88, pulse 91, temperature 98.2 F (36.8 C), temperature source Oral, resp. rate 17, height 5' 7"  (1.702 m), weight 215 lb (97.5 kg), SpO2 98 %.  General: Pleasant, NAD, moderately obese Psych: Normal affect. Neuro: Alert and oriented X 3. Moves all extremities spontaneously. HEENT: Normal  Neck: Supple without bruits or JVD. Lungs:  Resp regular and unlabored, CTA. Heart: RRR no s3, s4, or murmurs. Abdomen: Soft, non-tender, non-distended, BS + x 4.  Extremities: No clubbing, cyanosis or edema. DP/PT/Radials 2+ and equal bilaterally.  Labs  Troponin [facility] of Care Test)  Recent Labs  03/25/17 1344  TROPIPOC 0.00    Recent Labs  03/25/17 1628  TROPONINI 0.03*   Lab Results  Component Value Date   WBC 12.7 (H) 03/25/2017   HGB 14.6 03/25/2017  HCT 42.9 03/25/2017   MCV 84.6 03/25/2017   PLT 328 03/25/2017    Recent Labs Lab 03/25/17 1326  NA 139  K 3.9  CL 107  CO2 23  BUN 8  CREATININE 0.68  CALCIUM 9.7  GLUCOSE 122*   No results found for: CHOL, HDL, LDLCALC, TRIG Lab Results  Component Value Date   DDIMER 0.32 03/25/2017     Radiology/Studies  Dg Chest 2 View  Result Date: 03/25/2017 CLINICAL DATA:  Chest pain radiating to left arm beginning this morning. Shortness breath, diaphoresis, and nausea. EXAM: CHEST  2 VIEW COMPARISON:  10/23/2016 FINDINGS: The heart size and mediastinal contours are within normal limits. Both lungs are clear. The visualized skeletal structures are unremarkable. IMPRESSION: Negative.  No active cardiopulmonary disease. Electronically Signed   By: Earle Gell M.D.   On: 03/25/2017 14:15    ECG  Sinus rhythm no ischemic abnormalities-- personally reviewed  Telemetry  NSR -- personally reviewed     ASSESSMENT AND PLAN  1. Unstable angina: initial lab troponin the abnormal 0.03. She continues to have mild 5 out of 10 chest discomfort. Blood pressure has been  elevated in the ED with diastolic blood pressures in the 90s to low 100s. We'll try sublingual nitroglycerin. Symptoms are concerning for unstable angina. Substernal chest tightness radiating to the back with associated nausea and diaphoresis. Admit to telemetry. Cycle cardiac enzymes to assess trend. Given her risk factors and typical symptoms, we will likely plan for definitive left heart catheterization on Monday. Start aspirin, beta blocker and statin. Check fasting lipid panel in the morning as well as a hemoglobin A1c. Check 2D echo. IV heparin per pharmacy.    Signed, Lyda Jester, PA-C, MHS 03/25/2017, 6:32 PM [facility] Pager: [phone]  Personally seen and examined. Agree with above. 51 year old female smoker with concerning chest discomfort and mildly abnormal troponin of 0.03 with hypertension. Having moderate chest discomfort. Symptoms are concerning for unstable angina. Troponin is elevated. Physical exam reveals pleasant female, no significant distress, heart is regular rate and rhythm lungs are clear no significant murmurs, no edema.  Unstable angina  - With mildly elevated troponin, we will continue trend and we will start on IV heparin. It is possible that her mildly elevated troponin is a result of elevated blood pressure. However, given her chest discomfort we will place her on IV heparin and plan on cardiac catheterization Monday.  - Aspirin, beta blocker start metoprolol 25 g twice a day, continue with lisinopril 10 mg once a day. We will also start amlodipine 5 mg once a day given her hypertension. When necessary hydralazine can be administered if necessary.  - Echocardiogram  Tobacco use  - Counsel tobacco cessation.  Family history of MI  - Father age 4.  Candee Furbish, MD

## 2017-03-25 NOTE — ED Provider Notes (Signed)
[facility] DEPT Provider Note   CSN: [id number] Arrival date & time: 03/25/17  1317     History   Chief Complaint Chief Complaint  Patient presents with  . Chest Pain  . Back Pain    HPI Gina Sanchez is a 51 y.o. female.  The history is provided by the patient. No language interpreter was used.  Chest Pain   Associated symptoms include back pain.  Back Pain   Associated symptoms include chest pain.   Gina Sanchez is a 51 y.o. female who presents to the Emergency Department complaining of chest pain.  She reports a central chest and back pain that started about 6:30 this morning. Pain is described as a heaviness type sensation that comes and goes. Her initial episode lasted an hour with no clear alleviating or worsening factors. She did go on to work today but developed recurrent intermittent pain. She does have associated clamminess as well as nausea and shortness of breath. No current pain in the emergency department. She initially thought that this may be anxiety but this is more intense compared to prior episodes. Denies any fevers, vomiting, abdominal pain, leg swelling or pain. She is a smoker and has a history of hypertension and a family history of cardiac disease and her father had an MI in his early 56s. Past Medical History:  Diagnosis Date  . Abnormal Pap smear of cervix   . Anxiety   . Depression   . Essential hypertension   . Palpitations   . Precordial chest pain     Patient Active Problem List   Diagnosis Date Noted  . Essential hypertension   . Hypercholesteremia   . Precordial chest pain   . Palpitations     Past Surgical History:  Procedure Laterality Date  . CERVICAL BIOPSY  W/ LOOP ELECTRODE EXCISION    . COLONOSCOPY WITH PROPOFOL N/A 07/03/2015   Procedure: COLONOSCOPY WITH PROPOFOL;  Surgeon: Hulen Luster, MD;  Location: [facility] ENDOSCOPY;  Service: Gastroenterology;  Laterality: N/A;  . ESOPHAGOGASTRODUODENOSCOPY (EGD) WITH PROPOFOL N/A  07/03/2015   Procedure: ESOPHAGOGASTRODUODENOSCOPY (EGD) WITH PROPOFOL;  Surgeon: Hulen Luster, MD;  Location: [facility] ENDOSCOPY;  Service: Gastroenterology;  Laterality: N/A;    OB History    Gravida Para Term Preterm AB Living   2 2 2     2    SAB TAB Ectopic Multiple Live Births           2       Home Medications    Prior to Admission medications   Medication Sig Start Date End Date Taking? Authorizing Provider  ALPRAZolam Duanne Moron) 0.5 MG tablet Take 0.5 mg by mouth 2 (two) times daily as needed for anxiety.  11/29/13  Yes [provider]  levonorgestrel (MIRENA, 52 MG,) 20 MCG/24HR IUD 1 Intra Uterine Device (1 each total) by Intrauterine route once. 04/24/15  Yes Rubie Maid, MD  benzonatate (TESSALON PERLES) 100 MG capsule Take 1 capsule (100 mg total) by mouth every 6 (six) hours as needed for cough. Patient not taking: Reported on 03/25/2017 10/24/16   Loney Hering, MD  lidocaine (LIDODERM) 5 % Place 1 patch onto the skin every 12 (twelve) hours. Remove & Discard patch within 12 hours or as directed by MD Patient not taking: Reported on 03/25/2017 10/24/16 10/24/17  Loney Hering, MD  lisinopril (PRINIVIL,ZESTRIL) 20 MG tablet Take 25 mg by mouth daily.    [provider]  traMADol (ULTRAM) 50 MG tablet Take  1 tablet (50 mg total) by mouth every 6 (six) hours as needed. 10/24/16   Loney Hering, MD    Family History Family History  Problem Relation Age of Onset  . Diabetes Mother        alive @ 81  . Heart disease Father        died of MI @ 70  . Osteoporosis Maternal Grandmother   . Colon cancer Maternal Grandfather   . Other Unknown        3 older brothers, alive and well.  . Breast cancer Neg Hx   . Ovarian cancer Neg Hx     Social History Social History  Substance Use Topics  . Smoking status: Current Every Day Smoker    Packs/day: 1.00    Years: 30.00  . Smokeless tobacco: Never Used  . Alcohol use 0.0 oz/week     Comment: 1-2  drinks/week.     Allergies   Factive [gemifloxacin]   Review of Systems Review of Systems  Cardiovascular: Positive for chest pain.  Musculoskeletal: Positive for back pain.  All other systems reviewed and are negative.    Physical Exam Updated Vital Signs BP 115/77   Pulse 81   Temp 98.2 F (36.8 C) (Oral)   Resp 15   Ht 5' 7"  (1.702 m)   Wt 97.5 kg (215 lb)   SpO2 100%   BMI 33.67 kg/m   Physical Exam  Constitutional: She is oriented to person, place, and time. She appears well-developed and well-nourished.  HENT:  Head: Normocephalic and atraumatic.  Cardiovascular: Normal rate and regular rhythm.   No murmur heard. Pulmonary/Chest: Effort normal and breath sounds normal. No respiratory distress.  Abdominal: Soft. There is no tenderness. There is no rebound and no guarding.  Musculoskeletal: She exhibits no edema or tenderness.  Neurological: She is alert and oriented to person, place, and time.  Skin: Skin is warm and dry.  Psychiatric: She has a normal mood and affect. Her behavior is normal.  Nursing note and vitals reviewed.    ED Treatments / Results  Labs (all labs ordered are listed, but only abnormal results are displayed) Labs Reviewed  BASIC METABOLIC PANEL - Abnormal; Notable for the following:       Result Value   Glucose, Bld 122 (*)    All other components within normal limits  CBC - Abnormal; Notable for the following:    WBC 12.7 (*)    All other components within normal limits  D-DIMER, QUANTITATIVE (NOT AT [facility])  I-STAT TROPOININ, ED    EKG  EKG Interpretation  Date/Time:  Friday March 25 2017 13:21:31 EDT Ventricular Rate:  100 PR Interval:  162 QRS Duration: 74 QT Interval:  340 QTC Calculation: 438 R Axis:   57 Text Interpretation:  Normal sinus rhythm Normal ECG Confirmed by Hazle Coca [phone]) on 03/25/2017 1:32:38 PM       Radiology Dg Chest 2 View  Result Date: 03/25/2017 CLINICAL DATA:  Chest pain radiating to left  arm beginning this morning. Shortness breath, diaphoresis, and nausea. EXAM: CHEST  2 VIEW COMPARISON:  10/23/2016 FINDINGS: The heart size and mediastinal contours are within normal limits. Both lungs are clear. The visualized skeletal structures are unremarkable. IMPRESSION: Negative.  No active cardiopulmonary disease. Electronically Signed   By: Earle Gell M.D.   On: 03/25/2017 14:15    Procedures Procedures (including critical care time)  Medications Ordered in ED Medications  aspirin chewable tablet 324 mg (243  mg Oral Given 03/25/17 1443)     Initial Impression / Assessment and Plan / ED Course  I have reviewed the triage vital signs and the nursing notes.  Pertinent labs & imaging results that were available during my care of the patient were reviewed by me and considered in my medical decision making (see chart for details).     Patient here for evaluation of flexing and waning chest pain. EKG is unchanged from priors. She does have risk factors of hypertension, tobacco use, obesity, family history. Initial troponin is negative. Her symptoms are resolved in the emergency department. Discussed with patient option of admission for observation with stress testing versus repeat troponin and patient prefers a repeat lab draw with outpatient follow-up.  Patient  care transferred pending repeat troponin.  Presentation is not consistent with PE, d dimer negative.  Final Clinical Impressions(s) / ED Diagnoses   Final diagnoses:  Acute chest pain    New Prescriptions New Prescriptions   No medications on file     Quintella Reichert, MD 03/26/17 [phone]

## 2017-03-25 NOTE — ED Provider Notes (Signed)
51 y.o. Female with chest pain seen and evaluated by Dr. Ralene Bathe . Patient advised to have in house assessment but does not wish admission.  Repeat troponin pending and patient will be d/c'd if troponin negative.  Patient has return precautions and follow up information. Care assumed from Dr. Ralene Bathe. 4:24 PM    Pattricia Boss, MD 04/07/17 [phone]

## 2017-03-25 NOTE — ED Triage Notes (Signed)
Pt reports waking to CP this am, radiating to LUE, neck and back, with SOB, diaphoresis, nausea, weakness. Pt reports back p[ain now 7/10, CP 5/10, describes as "tightness". Resp e/u at this time.

## 2017-03-25 NOTE — ED Notes (Signed)
Pt given Coke to drink.

## 2017-03-26 DIAGNOSIS — I214 Non-ST elevation (NSTEMI) myocardial infarction: Secondary | ICD-10-CM

## 2017-03-26 LAB — LIPID PANEL
CHOL/HDL RATIO: 6.9 ratio
CHOLESTEROL: 199 mg/dL (ref 0–200)
HDL: 29 mg/dL — ABNORMAL LOW (ref >40)
LDL Cholesterol: UNDETERMINED mg/dL (ref 0–99)
Triglycerides: 502 mg/dL — ABNORMAL HIGH (ref <150)
VLDL: UNDETERMINED mg/dL (ref 0–40)

## 2017-03-26 LAB — BASIC METABOLIC PANEL
Anion gap: 9 (ref 5–15)
BUN: 13 mg/dL (ref 6–20)
CALCIUM: 9.1 mg/dL (ref 8.9–10.3)
CO2: 23 mmol/L (ref 22–32)
CREATININE: 0.88 mg/dL (ref 0.44–1.00)
Chloride: 105 mmol/L (ref 101–111)
GFR calc Af Amer: >60 mL/min (ref >60)
GLUCOSE: 121 mg/dL — AB (ref 65–99)
POTASSIUM: 3.7 mmol/L (ref 3.5–5.1)
SODIUM: 137 mmol/L (ref 135–145)

## 2017-03-26 LAB — HEPARIN LEVEL (UNFRACTIONATED)
Heparin Unfractionated: 0.1 IU/mL — ABNORMAL LOW (ref 0.30–0.70)
Heparin Unfractionated: 0.19 IU/mL — ABNORMAL LOW (ref 0.30–0.70)
Heparin Unfractionated: 0.24 IU/mL — ABNORMAL LOW (ref 0.30–0.70)

## 2017-03-26 LAB — TROPONIN I: Troponin I: <0.03 ng/mL (ref <0.03)

## 2017-03-26 LAB — HIV ANTIBODY (ROUTINE TESTING W REFLEX): HIV SCREEN 4TH GENERATION: NONREACTIVE

## 2017-03-26 NOTE — Progress Notes (Signed)
ANTICOAGULATION CONSULT NOTE - Follow Up Consult  Pharmacy Consult for Heparin  Indication: chest pain/ACS  Patient Measurements: Height: 5' 7"  (170.2 cm) Weight: 211 lb 4.8 oz (95.8 kg) IBW/kg (Calculated) : 61.6 Heparin Dosing Weight: 82.7 kg  Vital Signs: Temp: 98 F (36.7 C) (07/14 2002) Temp Source: Oral (07/14 2002) BP: 100/81 (07/14 2002) Pulse Rate: 80 (07/14 2002)  Labs:  Recent Labs  03/25/17 1326  03/25/17 2342 03/26/17 0526 03/26/17 1130 03/26/17 1220 03/26/17 1909  HGB 14.6  --   --   --   --   --   --   HCT 42.9  --   --   --   --   --   --   PLT 328  --   --   --   --   --   --   HEPARINUNFRC  --   --   --  0.10*  --  0.19* 0.24*  CREATININE 0.68  --   --  0.88  --   --   --   TROPONINI  --   < > <0.03 <0.03 <0.03  --   --   < > = values in this interval not displayed.  Estimated Creatinine Clearance: 90.9 mL/min (by C-G formula based on SCr of 0.88 mg/dL).  Assessment: Gina Sanchez is a 51 y/o F presenting with chest pain accompanied by nausea and diaphoresis. Troponins were slightly elevated at 0.03 on admit, but have trended down, EKG shows NSR with no abnormalities, D-dimer neg, and an ECHO is pending. Heparin level is still subtherapeutic at 0.24, Hgb 14.6, PLTs 328, and no bleeding noted.   Goal of Therapy:  Heparin level 0.3-0.7 units/ml Monitor platelets by anticoagulation protocol: Yes   Plan:  Increase heparin to 1650 units/hr Re-check level with AM labs Daily heparin level and CBC Monitor for S/sx of bleeding   Susa Raring PharmD PGY2 Infectious Diseases Pharmacy Resident 03/26/2017 8:53 PM Pager: [phone]

## 2017-03-26 NOTE — Progress Notes (Signed)
Progress Note  Patient Name: Gina Sanchez Date of Encounter: 03/26/2017  Primary Cardiologist: Marlou Porch  Subjective   Feels better, less back pain, no SOB  Inpatient Medications    Scheduled Meds: . aspirin EC  81 mg Oral Daily  . atorvastatin  40 mg Oral q1800  . buPROPion  300 mg Oral Daily  . lisinopril  10 mg Oral Daily  . metoprolol tartrate  25 mg Oral BID   Continuous Infusions: . heparin 1,250 Units/hr (03/26/17 0645)   PRN Meds: acetaminophen, ALPRAZolam, nitroGLYCERIN, ondansetron (ZOFRAN) IV   Vital Signs    Vitals:   03/25/17 2300 03/25/17 2328 03/26/17 0656 03/26/17 1019  BP: 111/83 134/86 116/75 128/86  Pulse: 77 82 74 78  Resp: 17  18   Temp:  98.9 F (37.2 C) 97.8 F (36.6 C)   TempSrc:  Oral Oral   SpO2: 94% 95% 96%   Weight:  211 lb 4.8 oz (95.8 kg)    Height:  5' 7"  (1.702 m)      Intake/Output Summary (Last 24 hours) at 03/26/17 1036 Last data filed at 03/26/17 0600  Gross per 24 hour  Intake           299.67 ml  Output              400 ml  Net          -100.33 ml   Filed Weights   03/25/17 1324 03/25/17 2328  Weight: 215 lb (97.5 kg) 211 lb 4.8 oz (95.8 kg)    Telemetry    NSR - Personally Reviewed  ECG    NSR - Personally Reviewed  Physical Exam   GEN: No acute distress.   Neck: No JVD Cardiac: RRR, no murmurs, rubs, or gallops.  Respiratory: Clear to auscultation bilaterally. GI: Soft, nontender, non-distended  MS: No edema; No deformity. Neuro:  Nonfocal  Psych: Normal affect   Labs    Chemistry Recent Labs Lab 03/25/17 1326 03/26/17 0526  NA 139 137  K 3.9 3.7  CL 107 105  CO2 23 23  GLUCOSE 122* 121*  BUN 8 13  CREATININE 0.68 0.88  CALCIUM 9.7 9.1  GFRNONAA >60 >60  GFRAA >60 >60  ANIONGAP 9 9     Hematology Recent Labs Lab 03/25/17 1326  WBC 12.7*  RBC 5.07  HGB 14.6  HCT 42.9  MCV 84.6  MCH 28.8  MCHC 34.0  RDW 14.6  PLT 328    Cardiac Enzymes Recent Labs Lab 03/25/17 1628  03/25/17 2342 03/26/17 0526  TROPONINI 0.03* <0.03 <0.03    Recent Labs Lab 03/25/17 1344  TROPIPOC 0.00     BNPNo results for input(s): BNP, PROBNP in the last 168 hours.   DDimer  Recent Labs Lab 03/25/17 1434  DDIMER 0.32     Radiology    Dg Chest 2 View  Result Date: 03/25/2017 CLINICAL DATA:  Chest pain radiating to left arm beginning this morning. Shortness breath, diaphoresis, and nausea. EXAM: CHEST  2 VIEW COMPARISON:  10/23/2016 FINDINGS: The heart size and mediastinal contours are within normal limits. Both lungs are clear. The visualized skeletal structures are unremarkable. IMPRESSION: Negative.  No active cardiopulmonary disease. Electronically Signed   By: Earle Gell M.D.   On: 03/25/2017 14:15    Cardiac Studies   Cath P  Patient Profile     51 y.o. female with NSTEMI, HTN.   Assessment & Plan    NSTEMI  - IV hep  -  cath Monday  - low level trop  - ASA, metoprol, statin, lisinprol.   - ECHO  Tob  - cessation  Family hx of CAD Father MI 67  Signed, Candee Furbish, MD  03/26/2017, 10:36 AM

## 2017-03-26 NOTE — Progress Notes (Signed)
ANTICOAGULATION CONSULT NOTE - Follow Up Consult  Pharmacy Consult for Heparin  Indication: chest pain/ACS  Patient Measurements: Height: 5' 7"  (170.2 cm) Weight: 211 lb 4.8 oz (95.8 kg) IBW/kg (Calculated) : 61.6 Heparin Dosing Weight: 82.7 kg  Vital Signs: Temp: 98.1 F (36.7 C) (07/14 1345) Temp Source: Oral (07/14 0656) BP: 125/85 (07/14 1345) Pulse Rate: 76 (07/14 1345)  Labs:  Recent Labs  03/25/17 1326  03/25/17 2342 03/26/17 0526 03/26/17 1130 03/26/17 1220  HGB 14.6  --   --   --   --   --   HCT 42.9  --   --   --   --   --   PLT 328  --   --   --   --   --   HEPARINUNFRC  --   --   --  0.10*  --  0.19*  CREATININE 0.68  --   --  0.88  --   --   TROPONINI  --   < > <0.03 <0.03 <0.03  --   < > = values in this interval not displayed.  Estimated Creatinine Clearance: 90.9 mL/min (by C-G formula based on SCr of 0.88 mg/dL).  Assessment: Gina Sanchez is a 51 y/o F presenting with chest pain accompanied by nausea and diaphoresis. Troponins were slightly elevated at 0.03 on admit, but have trended down, EKG shows NSR with no abnormalities, D-dimer neg, and an ECHO is pending. Overnight heparin was initiated and titrated up to 1,250 units/hr secondary to subtherapeutic levels (0.1). Her most recent heparin level is still low at 0.19, Hgb 14.6, PLTs 328, and no bleeding noted.   Goal of Therapy:  Heparin level 0.3-0.7 units/ml Monitor platelets by anticoagulation protocol: Yes   Plan:  Increase heparin to 1500 units/hr Heparin level in 6 hours Daily heparin level and CBC Monitor for S/sx of bleeding   Patterson Hammersmith PharmD PGY1 Pharmacy Practice Resident 03/26/2017 3:15 PM Pager: [phone]

## 2017-03-27 ENCOUNTER — Inpatient Hospital Stay (HOSPITAL_COMMUNITY): Payer: BLUE CROSS/BLUE SHIELD

## 2017-03-27 DIAGNOSIS — R072 Precordial pain: Secondary | ICD-10-CM

## 2017-03-27 LAB — HEPARIN LEVEL (UNFRACTIONATED)
Heparin Unfractionated: 0.39 IU/mL (ref 0.30–0.70)
Heparin Unfractionated: 0.59 IU/mL (ref 0.30–0.70)

## 2017-03-27 LAB — CBC
HCT: 41.6 % (ref 36.0–46.0)
Hemoglobin: 13.7 g/dL (ref 12.0–15.0)
MCH: 28.2 pg (ref 26.0–34.0)
MCHC: 32.9 g/dL (ref 30.0–36.0)
MCV: 85.8 fL (ref 78.0–100.0)
PLATELETS: 284 10*3/uL (ref 150–400)
RBC: 4.85 MIL/uL (ref 3.87–5.11)
RDW: 14.4 % (ref 11.5–15.5)
WBC: 16.4 10*3/uL — AB (ref 4.0–10.5)

## 2017-03-27 LAB — ECHOCARDIOGRAM COMPLETE
HEIGHTINCHES: 67 in
WEIGHTICAEL: 3350.4 [oz_av]

## 2017-03-27 MED ORDER — SODIUM CHLORIDE 0.9% FLUSH
3.0000 mL | Freq: Two times a day (BID) | INTRAVENOUS | Status: DC
  Administered 2017-03-28: 3 mL via INTRAVENOUS

## 2017-03-27 MED ORDER — SODIUM CHLORIDE 0.9 % WEIGHT BASED INFUSION
1.0000 mL/kg/h | INTRAVENOUS | Status: DC
  Administered 2017-03-27: 1 mL/kg/h via INTRAVENOUS

## 2017-03-27 MED ORDER — SODIUM CHLORIDE 0.9% FLUSH: 3.0000 mL | INTRAVENOUS | Status: DC | PRN

## 2017-03-27 MED ORDER — SODIUM CHLORIDE 0.9 % IV SOLN: 250.0000 mL | INTRAVENOUS | Status: DC | PRN

## 2017-03-27 MED ORDER — ASPIRIN 81 MG PO CHEW
81.0000 mg | CHEWABLE_TABLET | ORAL | Status: AC
  Administered 2017-03-28: 81 mg via ORAL
  Filled 2017-03-27: qty 1

## 2017-03-27 NOTE — Progress Notes (Signed)
  Echocardiogram 2D Echocardiogram has been performed.  Gina Sanchez 03/27/2017, 3:44 PM

## 2017-03-27 NOTE — Progress Notes (Signed)
Chaplain visited patient per consult for Advanced Directive.  Chaplain provided education to patient and her family (with her permission) about AD.  Patient received paperwork and would let us know if and when she was ready for completion.  Chaplain let patient know that paperwork can be completed M-F before 3:30pm while she was a patient in the hospital.  Chaplain then asked if patient would like prayer.  Family very receptive to prayer and expressed thanks and appreciation for prayer and for Chaplain presence.  Chaplain would like to thank medical team for their care for this patient and her family.  Chaplain available as needed.    03/27/17 1124  Clinical Encounter Type  Visited With Patient and family together  Visit Type Initial;Social support;Spiritual support  Referral From Physician  Consult/Referral To Chaplain  Spiritual Encounters  Spiritual Needs Prayer;Literature (Advanced Directive education)

## 2017-03-27 NOTE — Progress Notes (Signed)
ANTICOAGULATION CONSULT NOTE - Follow Up Consult  Pharmacy Consult for heparin Indication: NSTEMI  Labs:  Recent Labs  03/25/17 1326  03/25/17 2342  03/26/17 0526 03/26/17 1130 03/26/17 1220 03/26/17 1909 03/27/17 0311  HGB 14.6  --   --   --   --   --   --   --  13.7  HCT 42.9  --   --   --   --   --   --   --  41.6  PLT 328  --   --   --   --   --   --   --  284  HEPARINUNFRC  --   --   --   < > 0.10*  --  0.19* 0.24* 0.39  CREATININE 0.68  --   --   --  0.88  --   --   --   --   TROPONINI  --   < > <0.03  --  <0.03 <0.03  --   --   --   < > = values in this interval not displayed.   Assessment/Plan:  51yo female therapeutic on heparin after rate changes. Will continue gtt at current rate and confirm stable with additional level.   Wynona Neat, PharmD, BCPS  03/27/2017,4:15 AM

## 2017-03-27 NOTE — Progress Notes (Signed)
ANTICOAGULATION CONSULT NOTE - Follow Up Consult  Pharmacy Consult for Heparin  Indication: chest pain/ACS  Patient Measurements: Height: 5' 7"  (170.2 cm) Weight: 209 lb 6.4 oz (95 kg) IBW/kg (Calculated) : 61.6 Heparin Dosing Weight: 82.7 kg  Vital Signs: Temp: 98.2 F (36.8 C) (07/15 0500) Temp Source: Oral (07/15 0500) BP: 96/77 (07/15 0500) Pulse Rate: 78 (07/15 0500)  Labs:  Recent Labs  03/25/17 1326  03/25/17 2342 03/26/17 0526 03/26/17 1130  03/26/17 1909 03/27/17 0311 03/27/17 0920  HGB 14.6  --   --   --   --   --   --  13.7  --   HCT 42.9  --   --   --   --   --   --  41.6  --   PLT 328  --   --   --   --   --   --  284  --   HEPARINUNFRC  --   --   --  0.10*  --   < > 0.24* 0.39 0.59  CREATININE 0.68  --   --  0.88  --   --   --   --   --   TROPONINI  --   < > <0.03 <0.03 <0.03  --   --   --   --   < > = values in this interval not displayed.  Estimated Creatinine Clearance: 90.6 mL/min (by C-G formula based on SCr of 0.88 mg/dL).  Assessment: Gina Sanchez is a 51 y/o F presenting with chest pain accompanied by nausea and diaphoresis. Troponins were slightly elevated at 0.03 on admit, but have trended down, EKG shows NSR with no abnormalities, D-dimer neg, and an ECHO is pending. Left heart cath is scheduled for Monday 07/16.   Heparin increased to 1650 units/hr overnight after another subtherapeutic level (0.24). At 0300 and 0900 both heparin levels were therapeutic at 0.39 and 0.59 respectively with the new rate. Hgb 13.7, PLTs dropped from 328 to 284, no bleeding seen or noted.   Goal of Therapy:  Heparin level 0.3-0.7 units/ml Monitor platelets by anticoagulation protocol: Yes   Plan:  Continue heparin 1650 units/hr Daily heparin level and CBC (PLTs) Monitor for S/sx of bleeding   Patterson Hammersmith PharmD PGY1 Pharmacy Practice Resident 03/27/2017 12:08 PM Pager: [phone]

## 2017-03-27 NOTE — Progress Notes (Signed)
Progress Note  Patient Name: Gina Sanchez Date of Encounter: 03/27/2017  Primary Cardiologist: Marlou Porch  Subjective   A little anxious last night. No CP, no SOB  Inpatient Medications    Scheduled Meds: . aspirin EC  81 mg Oral Daily  . atorvastatin  40 mg Oral q1800  . buPROPion  300 mg Oral Daily  . lisinopril  10 mg Oral Daily  . metoprolol tartrate  25 mg Oral BID   Continuous Infusions: . heparin 1,650 Units/hr (03/27/17 1041)   PRN Meds: acetaminophen, ALPRAZolam, nitroGLYCERIN, ondansetron (ZOFRAN) IV   Vital Signs    Vitals:   03/26/17 1019 03/26/17 1345 03/26/17 2002 03/27/17 0500  BP: 128/86 125/85 100/81 96/77  Pulse: 78 76 80 78  Resp:  20 20 18   Temp:  98.1 F (36.7 C) 98 F (36.7 C) 98.2 F (36.8 C)  TempSrc:   Oral Oral  SpO2:  98% 98% 93%  Weight:    209 lb 6.4 oz (95 kg)  Height:        Intake/Output Summary (Last 24 hours) at 03/27/17 1047 Last data filed at 03/27/17 0600  Gross per 24 hour  Intake          1788.72 ml  Output             1250 ml  Net           538.72 ml   Filed Weights   03/25/17 1324 03/25/17 2328 03/27/17 0500  Weight: 215 lb (97.5 kg) 211 lb 4.8 oz (95.8 kg) 209 lb 6.4 oz (95 kg)    Telemetry    NSR - Personally Reviewed  ECG    NSR - Personally Reviewed  Physical Exam   GEN: Well nourished, well developed, in no acute distress  HEENT: normal  Neck: no JVD, carotid bruits, or masses Cardiac: RRR; no murmurs, rubs, or gallops,no edema  Respiratory:  clear to auscultation bilaterally, normal work of breathing GI: soft, nontender, nondistended, + BS MS: no deformity or atrophy  Skin: warm and dry, no rash Neuro:  Alert and Oriented x 3, Strength and sensation are intact Psych: euthymic mood, full affect   Labs    Chemistry  Recent Labs Lab 03/25/17 1326 03/26/17 0526  NA 139 137  K 3.9 3.7  CL 107 105  CO2 23 23  GLUCOSE 122* 121*  BUN 8 13  CREATININE 0.68 0.88  CALCIUM 9.7 9.1    GFRNONAA >60 >60  GFRAA >60 >60  ANIONGAP 9 9     Hematology  Recent Labs Lab 03/25/17 1326 03/27/17 0311  WBC 12.7* 16.4*  RBC 5.07 4.85  HGB 14.6 13.7  HCT 42.9 41.6  MCV 84.6 85.8  MCH 28.8 28.2  MCHC 34.0 32.9  RDW 14.6 14.4  PLT 328 284    Cardiac Enzymes  Recent Labs Lab 03/25/17 1628 03/25/17 2342 03/26/17 0526 03/26/17 1130  TROPONINI 0.03* <0.03 <0.03 <0.03     Recent Labs Lab 03/25/17 1344  TROPIPOC 0.00     BNPNo results for input(s): BNP, PROBNP in the last 168 hours.   DDimer   Recent Labs Lab 03/25/17 1434  DDIMER 0.32     Radiology    Dg Chest 2 View  Result Date: 03/25/2017 CLINICAL DATA:  Chest pain radiating to left arm beginning this morning. Shortness breath, diaphoresis, and nausea. EXAM: CHEST  2 VIEW COMPARISON:  10/23/2016 FINDINGS: The heart size and mediastinal contours are within normal limits. Both lungs  are clear. The visualized skeletal structures are unremarkable. IMPRESSION: Negative.  No active cardiopulmonary disease. Electronically Signed   By: Earle Gell M.D.   On: 03/25/2017 14:15    Cardiac Studies   Cath P  Patient Profile     51 y.o. female with NSTEMI, HTN.   Assessment & Plan    NSTEMI  - IV hep  - cath Monday  - low level trop  - ASA, metoprol, statin, lisinprol.   - ECHO  Tob  - cessation  Family hx of CAD Father MI 39  No change in plans. CATH Monday  Signed, Candee Furbish, MD  03/27/2017, 10:47 AM

## 2017-03-28 ENCOUNTER — Encounter (HOSPITAL_COMMUNITY)
Admission: EM | Disposition: A | Discharge status: Home / Self Care | Payer: Self-pay | Source: Home / Self Care | Attending: Cardiology

## 2017-03-28 HISTORY — PX: LEFT HEART CATH AND CORONARY ANGIOGRAPHY: CATH118249

## 2017-03-28 LAB — PROTIME-INR
INR: 0.98
Prothrombin Time: 13 seconds (ref 11.4–15.2)

## 2017-03-28 LAB — CBC
HEMATOCRIT: 40.2 % (ref 36.0–46.0)
HEMOGLOBIN: 13.4 g/dL (ref 12.0–15.0)
MCH: 28.5 pg (ref 26.0–34.0)
MCHC: 33.3 g/dL (ref 30.0–36.0)
MCV: 85.5 fL (ref 78.0–100.0)
Platelets: 278 10*3/uL (ref 150–400)
RBC: 4.7 MIL/uL (ref 3.87–5.11)
RDW: 14.3 % (ref 11.5–15.5)
WBC: 11.7 10*3/uL — AB (ref 4.0–10.5)

## 2017-03-28 LAB — HEPARIN LEVEL (UNFRACTIONATED): HEPARIN UNFRACTIONATED: 0.45 [IU]/mL (ref 0.30–0.70)

## 2017-03-28 SURGERY — LEFT HEART CATH AND CORONARY ANGIOGRAPHY
Anesthesia: LOCAL

## 2017-03-28 MED ORDER — HEPARIN SODIUM (PORCINE) 1000 UNIT/ML IJ SOLN
INTRAMUSCULAR | Status: DC | PRN
  Administered 2017-03-28: 5000 [IU] via INTRAVENOUS

## 2017-03-28 MED ORDER — VERAPAMIL HCL 2.5 MG/ML IV SOLN
INTRAVENOUS | Status: AC
  Filled 2017-03-28: qty 2

## 2017-03-28 MED ORDER — LIDOCAINE HCL 1 % IJ SOLN
INTRAMUSCULAR | Status: AC
  Filled 2017-03-28: qty 20

## 2017-03-28 MED ORDER — ONDANSETRON HCL 4 MG/2ML IJ SOLN: 4.0000 mg | Freq: Four times a day (QID) | INTRAMUSCULAR | Status: DC | PRN

## 2017-03-28 MED ORDER — MORPHINE SULFATE (PF) 2 MG/ML IV SOLN: 2.0000 mg | INTRAVENOUS | Status: DC | PRN

## 2017-03-28 MED ORDER — ASPIRIN 81 MG PO CHEW: 81.0000 mg | CHEWABLE_TABLET | Freq: Every day | ORAL | Status: DC

## 2017-03-28 MED ORDER — ACETAMINOPHEN 325 MG PO TABS: 650.0000 mg | ORAL_TABLET | ORAL | Status: DC | PRN

## 2017-03-28 MED ORDER — HEPARIN (PORCINE) IN NACL 2-0.9 UNIT/ML-% IJ SOLN
INTRAMUSCULAR | Status: AC | PRN
  Administered 2017-03-28: 1000 mL via INTRA_ARTERIAL

## 2017-03-28 MED ORDER — SODIUM CHLORIDE 0.9 % IV SOLN: 250.0000 mL | INTRAVENOUS | Status: DC | PRN

## 2017-03-28 MED ORDER — MIDAZOLAM HCL 2 MG/2ML IJ SOLN
INTRAMUSCULAR | Status: DC | PRN
  Administered 2017-03-28: 1 mg via INTRAVENOUS

## 2017-03-28 MED ORDER — SODIUM CHLORIDE 0.9% FLUSH
3.0000 mL | Freq: Two times a day (BID) | INTRAVENOUS | Status: DC
  Administered 2017-03-29: 3 mL via INTRAVENOUS

## 2017-03-28 MED ORDER — VERAPAMIL HCL 2.5 MG/ML IV SOLN
INTRA_ARTERIAL | Status: DC | PRN
  Administered 2017-03-28 (×2): via INTRA_ARTERIAL

## 2017-03-28 MED ORDER — IOPAMIDOL (ISOVUE-370) INJECTION 76%
INTRAVENOUS | Status: AC
  Filled 2017-03-28: qty 100

## 2017-03-28 MED ORDER — FENTANYL CITRATE (PF) 100 MCG/2ML IJ SOLN
INTRAMUSCULAR | Status: AC
  Filled 2017-03-28: qty 2

## 2017-03-28 MED ORDER — IOPAMIDOL (ISOVUE-370) INJECTION 76%
INTRAVENOUS | Status: DC | PRN
  Administered 2017-03-28: 70 mL via INTRAVENOUS

## 2017-03-28 MED ORDER — FENTANYL CITRATE (PF) 100 MCG/2ML IJ SOLN
INTRAMUSCULAR | Status: DC | PRN
  Administered 2017-03-28: 25 ug via INTRAVENOUS

## 2017-03-28 MED ORDER — LIDOCAINE HCL (PF) 1 % IJ SOLN
INTRAMUSCULAR | Status: DC | PRN
  Administered 2017-03-28: 2 mL

## 2017-03-28 MED ORDER — MIDAZOLAM HCL 2 MG/2ML IJ SOLN
INTRAMUSCULAR | Status: AC
  Filled 2017-03-28: qty 2

## 2017-03-28 MED ORDER — HEPARIN (PORCINE) IN NACL 2-0.9 UNIT/ML-% IJ SOLN
INTRAMUSCULAR | Status: AC
  Filled 2017-03-28: qty 1000

## 2017-03-28 MED ORDER — SODIUM CHLORIDE 0.9% FLUSH: 3.0000 mL | INTRAVENOUS | Status: DC | PRN

## 2017-03-28 MED ORDER — SODIUM CHLORIDE 0.9 % IV SOLN
INTRAVENOUS | Status: AC
  Administered 2017-03-28: 18:00:00 via INTRAVENOUS

## 2017-03-28 SURGICAL SUPPLY — 12 items

## 2017-03-28 NOTE — Progress Notes (Signed)
Progress Note  Patient Name: Gina Sanchez Date of Encounter: 03/28/2017  Primary Cardiologist: Marlou Porch   Subjective   No further chest pain.   Inpatient Medications    Scheduled Meds: . aspirin EC  81 mg Oral Daily  . atorvastatin  40 mg Oral q1800  . buPROPion  300 mg Oral Daily  . lisinopril  10 mg Oral Daily  . metoprolol tartrate  25 mg Oral BID  . sodium chloride flush  3 mL Intravenous Q12H   Continuous Infusions: . sodium chloride    . sodium chloride 1 mL/kg/hr (03/27/17 2240)  . heparin 1,650 Units/hr (03/27/17 2237)   PRN Meds: sodium chloride, acetaminophen, ALPRAZolam, nitroGLYCERIN, ondansetron (ZOFRAN) IV, sodium chloride flush   Vital Signs    Vitals:   03/27/17 1932 03/27/17 2216 03/28/17 0121 03/28/17 0431  BP:  118/66 117/75 (!) 93/59  Pulse:  79  72  Resp:      Temp:  98.6 F (37 C)  98.8 F (37.1 C)  TempSrc:  Oral  Oral  SpO2:  97%  94%  Weight: 209 lb 7 oz (95 kg)   211 lb 9.6 oz (96 kg)  Height:        Intake/Output Summary (Last 24 hours) at 03/28/17 0840 Last data filed at 03/28/17 0700  Gross per 24 hour  Intake          2159.17 ml  Output                0 ml  Net          2159.17 ml   Filed Weights   03/27/17 0500 03/27/17 1932 03/28/17 0431  Weight: 209 lb 6.4 oz (95 kg) 209 lb 7 oz (95 kg) 211 lb 9.6 oz (96 kg)    Telemetry    SR - Personally Reviewed  ECG    SR - Personally Reviewed  Physical Exam   General: Well developed, well nourished, female appearing in no acute distress. Head: Normocephalic, atraumatic.  Neck: Supple without bruits, JVD. Lungs:  Resp regular and unlabored, CTA. Heart: RRR, S1, S2, no S3, S4, or murmur; no rub. Abdomen: Soft, non-tender, non-distended with normoactive bowel sounds. No hepatomegaly. No rebound/guarding. No obvious abdominal masses. Extremities: No clubbing, cyanosis, edema. Distal pedal pulses are 2+ bilaterally. Neuro: Alert and oriented X 3. Moves all extremities  spontaneously. Psych: Normal affect.  Labs    Chemistry Recent Labs Lab 03/25/17 1326 03/26/17 0526  NA 139 137  K 3.9 3.7  CL 107 105  CO2 23 23  GLUCOSE 122* 121*  BUN 8 13  CREATININE 0.68 0.88  CALCIUM 9.7 9.1  GFRNONAA >60 >60  GFRAA >60 >60  ANIONGAP 9 9     Hematology Recent Labs Lab 03/25/17 1326 03/27/17 0311 03/28/17 0303  WBC 12.7* 16.4* 11.7*  RBC 5.07 4.85 4.70  HGB 14.6 13.7 13.4  HCT 42.9 41.6 40.2  MCV 84.6 85.8 85.5  MCH 28.8 28.2 28.5  MCHC 34.0 32.9 33.3  RDW 14.6 14.4 14.3  PLT 328 284 278    Cardiac Enzymes Recent Labs Lab 03/25/17 1628 03/25/17 2342 03/26/17 0526 03/26/17 1130  TROPONINI 0.03* <0.03 <0.03 <0.03    Recent Labs Lab 03/25/17 1344  TROPIPOC 0.00     BNPNo results for input(s): BNP, PROBNP in the last 168 hours.   DDimer  Recent Labs Lab 03/25/17 1434  DDIMER 0.32      Radiology    No results found.  Cardiac Studies   TTE: 03/27/17  Study Conclusions  - Left ventricle: The cavity size was normal. Systolic function was   normal. The estimated ejection fraction was in the range of 55%   to 60%. Wall motion was normal; there were no regional wall   motion abnormalities. Left ventricular diastolic function   parameters were normal. - Aortic valve: Transvalvular velocity was within the normal range.   There was no stenosis. There was no regurgitation. - Mitral valve: Transvalvular velocity was within the normal range.   There was no evidence for stenosis. There was trivial   regurgitation. - Left atrium: The atrium was mildly dilated. - Right ventricle: The cavity size was normal. Wall thickness was   normal. Systolic function was normal. - Atrial septum: No defect or patent foramen ovale was identified   by color flow Doppler. - Tricuspid valve: There was trivial regurgitation. - Pulmonary arteries: Systolic pressure was within the normal   range.  Patient Profile     51 y.o. female with PMH  of NSTEMI, tobacco use and HTN who presented to the ED with chest pain and elevated troponin.   Assessment & Plan    1. Unstable Angina: Trop 0.03 x4. Given her reported symptoms, family Hx of CAD and CRFs planned for LHC today. Echo showed normal LV function, with no WMA. No further chest pain.  -- remains on IV heparin, ASA, statin, BB, ACEi  2. HTN: Well controlled with current therapy  3. HL: Trig 502, LDL- unable to calculate. Started on Lipitor 55m daily this admission.   4. Tobacco Use: cessation aSadie Haber LReino Bellis NP  03/28/2017, 8:40 AM    Agree with note by LReino BellisNP-C  Admitted with unstable angina. No recurrent symptoms. Troponin are low and flat. EKG shows no acute changes. She does have positive risk factors. Plan is for diagnostic coronary angiography today.The patient understands that risks included but are not limited to stroke (1 in 1000), death (1 in 128, kidney failure [usually temporary] (1 in 500), bleeding (1 in 200), allergic reaction [possibly serious] (1 in 200). The patient understands and agrees to proceed  JLorretta Harp M.D., FTeaticket [facility] [place] [place]  [id number] [phone]/16/2018 9:12 AM

## 2017-03-28 NOTE — H&P (View-Only) (Signed)
Progress Note  Patient Name: Gina Sanchez Date of Encounter: 03/28/2017  Primary Cardiologist: Marlou Porch   Subjective   No further chest pain.   Inpatient Medications    Scheduled Meds: . aspirin EC  81 mg Oral Daily  . atorvastatin  40 mg Oral q1800  . buPROPion  300 mg Oral Daily  . lisinopril  10 mg Oral Daily  . metoprolol tartrate  25 mg Oral BID  . sodium chloride flush  3 mL Intravenous Q12H   Continuous Infusions: . sodium chloride    . sodium chloride 1 mL/kg/hr (03/27/17 2240)  . heparin 1,650 Units/hr (03/27/17 2237)   PRN Meds: sodium chloride, acetaminophen, ALPRAZolam, nitroGLYCERIN, ondansetron (ZOFRAN) IV, sodium chloride flush   Vital Signs    Vitals:   03/27/17 1932 03/27/17 2216 03/28/17 0121 03/28/17 0431  BP:  118/66 117/75 (!) 93/59  Pulse:  79  72  Resp:      Temp:  98.6 F (37 C)  98.8 F (37.1 C)  TempSrc:  Oral  Oral  SpO2:  97%  94%  Weight: 209 lb 7 oz (95 kg)   211 lb 9.6 oz (96 kg)  Height:        Intake/Output Summary (Last 24 hours) at 03/28/17 0840 Last data filed at 03/28/17 0700  Gross per 24 hour  Intake          2159.17 ml  Output                0 ml  Net          2159.17 ml   Filed Weights   03/27/17 0500 03/27/17 1932 03/28/17 0431  Weight: 209 lb 6.4 oz (95 kg) 209 lb 7 oz (95 kg) 211 lb 9.6 oz (96 kg)    Telemetry    SR - Personally Reviewed  ECG    SR - Personally Reviewed  Physical Exam   General: Well developed, well nourished, female appearing in no acute distress. Head: Normocephalic, atraumatic.  Neck: Supple without bruits, JVD. Lungs:  Resp regular and unlabored, CTA. Heart: RRR, S1, S2, no S3, S4, or murmur; no rub. Abdomen: Soft, non-tender, non-distended with normoactive bowel sounds. No hepatomegaly. No rebound/guarding. No obvious abdominal masses. Extremities: No clubbing, cyanosis, edema. Distal pedal pulses are 2+ bilaterally. Neuro: Alert and oriented X 3. Moves all extremities  spontaneously. Psych: Normal affect.  Labs    Chemistry Recent Labs Lab 03/25/17 1326 03/26/17 0526  NA 139 137  K 3.9 3.7  CL 107 105  CO2 23 23  GLUCOSE 122* 121*  BUN 8 13  CREATININE 0.68 0.88  CALCIUM 9.7 9.1  GFRNONAA >60 >60  GFRAA >60 >60  ANIONGAP 9 9     Hematology Recent Labs Lab 03/25/17 1326 03/27/17 0311 03/28/17 0303  WBC 12.7* 16.4* 11.7*  RBC 5.07 4.85 4.70  HGB 14.6 13.7 13.4  HCT 42.9 41.6 40.2  MCV 84.6 85.8 85.5  MCH 28.8 28.2 28.5  MCHC 34.0 32.9 33.3  RDW 14.6 14.4 14.3  PLT 328 284 278    Cardiac Enzymes Recent Labs Lab 03/25/17 1628 03/25/17 2342 03/26/17 0526 03/26/17 1130  TROPONINI 0.03* <0.03 <0.03 <0.03    Recent Labs Lab 03/25/17 1344  TROPIPOC 0.00     BNPNo results for input(s): BNP, PROBNP in the last 168 hours.   DDimer  Recent Labs Lab 03/25/17 1434  DDIMER 0.32      Radiology    No results found.  Cardiac Studies   TTE: 03/27/17  Study Conclusions  - Left ventricle: The cavity size was normal. Systolic function was   normal. The estimated ejection fraction was in the range of 55%   to 60%. Wall motion was normal; there were no regional wall   motion abnormalities. Left ventricular diastolic function   parameters were normal. - Aortic valve: Transvalvular velocity was within the normal range.   There was no stenosis. There was no regurgitation. - Mitral valve: Transvalvular velocity was within the normal range.   There was no evidence for stenosis. There was trivial   regurgitation. - Left atrium: The atrium was mildly dilated. - Right ventricle: The cavity size was normal. Wall thickness was   normal. Systolic function was normal. - Atrial septum: No defect or patent foramen ovale was identified   by color flow Doppler. - Tricuspid valve: There was trivial regurgitation. - Pulmonary arteries: Systolic pressure was within the normal   range.  Patient Profile     51 y.o. female with PMH  of NSTEMI, tobacco use and HTN who presented to the ED with chest pain and elevated troponin.   Assessment & Plan    1. Unstable Angina: Trop 0.03 x4. Given her reported symptoms, family Hx of CAD and CRFs planned for LHC today. Echo showed normal LV function, with no WMA. No further chest pain.  -- remains on IV heparin, ASA, statin, BB, ACEi  2. HTN: Well controlled with current therapy  3. HL: Trig 502, LDL- unable to calculate. Started on Lipitor 46m daily this admission.   4. Tobacco Use: cessation aSadie Haber LReino Bellis NP  03/28/2017, 8:40 AM    Agree with note by LReino BellisNP-C  Admitted with unstable angina. No recurrent symptoms. Troponin are low and flat. EKG shows no acute changes. She does have positive risk factors. Plan is for diagnostic coronary angiography today.The patient understands that risks included but are not limited to stroke (1 in 1000), death (1 in 177, kidney failure [usually temporary] (1 in 500), bleeding (1 in 200), allergic reaction [possibly serious] (1 in 200). The patient understands and agrees to proceed  JLorretta Harp M.D., F[place] F[facility] [place] [place]  [id number] [phone]/16/2018 9:12 AM

## 2017-03-28 NOTE — Discharge Summary (Signed)
Discharge Summary    Patient ID: HOA BRIGGS,  MRN: [id number], DOB/AGE: 1965/10/12 51 y.o.  Admit date: 03/25/2017 Discharge date: 03/29/2017  Primary Care Provider: Lorelee Market Primary Cardiologist: Marlou Porch (would like to follow up in [facility])  Discharge Diagnoses    Active Problems:   Unstable angina [facility])   Essential hypertension   Hypercholesteremia   Tobacco use   Allergies Allergies  Allergen Reactions  . Factive [Gemifloxacin] Rash    Diagnostic Studies/Procedures    LHC: 03/28/17  Conclusion     The left ventricular systolic function is normal.  LV end diastolic pressure is normal.  The left ventricular ejection fraction is 55-65% by visual estimate.   IMPRESSION: Ms. Ripp has normal coronary arteries and normal LV function. I believe her chest pain is noncardiac. The sheath was removed and a TR band was placed on the right breast to achieve patent hemostasis. The patient left the lab in stable condition. She will be discharged home tomorrow morning.  [name]. MD, [facility] _____________   History of Present Illness     51 y/o moderately obese female with h/o HTN, tobacco use and family h/o CAD (father w/ fatal MI at 41), normal ETT 09/2015, presenting to the ED with complaint of chest pain with abnormal troponin.   She has been a smoker for 20 years. She smokes a pack per day. She reported that she is on medications for hypertension. She denied any prior history of hyperlipidemia or diabetes. She was seen in our office as a new patient in January 2017 for atypical chest pain and underwent an exercise tolerance test which was a normal study. She has not been seen back in our clinic since that time.  She presented to the [facility] emergency department with complaints of chest pain. She was in her usual state of health until earlier that morning. She woke up with substernal chest pressure/heaviness radiating to her back. See also felt  nauseated however no vomiting. She was slightly diaphoretic. No significant dyspnea. The discomfort was rated 8 out of 10 and was fairly constant. She did not recall it worsening with exertion when she was getting ready for work. 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 She took an extra blood pressure pill. Her chest pain improved slightly however throughout the day she had recurrent chest pain prompting presentation to the emergency department. She also tried antacids w/o relief.   Point of care troponin was negative however actual lab troponin was slightly abnormal at 0.03. D-Dimer negative. CBC shows slight leukocytosis with a white blood count of 12.7. BMP is unremarkable. She is afebrile. CXr negative. EKG shows normal sinus rhythm without any ischemic abnormalities. Given her reported symptoms, she was started on IV heparin and admitted for further work up.   Hospital Course     Trop cycled and negative x3. She prepped over the weekend, and underwent LHC with Dr. Gwenlyn Found note above with normal coronaries and LVEF. Trig noted at 502, and therefore was started on Lipitor 22m daily this admission. Also started metoprolol 250mBID with improvement in blood pressure. It was felt that her chest pain was noncardiac in nature. Smoking cessation was discussed this admission. Post cath labs showed Cr 0.83 and Hgb 12.8.   She was seen by Dr. BeGwenlyn Foundnd determined stable for discharge home. [place]. Medications are listed  below.  _____________  Discharge Vitals Blood pressure 103/83, pulse 75, temperature 98.4 F (36.9 C), temperature source Oral, resp. rate 18, height 5' 7"  (1.702 m), weight 210 lb 11.2 oz (95.6 kg), SpO2 98 %.  Filed Weights   03/27/17 1932 03/28/17 0431 03/29/17 0500  Weight: 209 lb 7 oz (95 kg) 211 lb 9.6 oz (96 kg) 210 lb 11.2 oz (95.6 kg)    Labs & Radiologic Studies      CBC  Recent Labs  03/28/17 0303 03/29/17 0255  WBC 11.7* 10.7*  HGB 13.4 12.8  HCT 40.2 38.6  MCV 85.5 85.6  PLT 278 956   Basic Metabolic Panel  Recent Labs  03/29/17 0255  NA 138  K 4.0  CL 109  CO2 23  GLUCOSE 96  BUN 10  CREATININE 0.83  CALCIUM 8.8*   Liver Function Tests No results for input(s): AST, ALT, ALKPHOS, BILITOT, PROT, ALBUMIN in the last 72 hours. No results for input(s): LIPASE, AMYLASE in the last 72 hours. Cardiac Enzymes  Recent Labs  03/26/17 1130  TROPONINI <0.03   BNP Invalid input(s): POCBNP D-Dimer No results for input(s): DDIMER in the last 72 hours. Hemoglobin A1C No results for input(s): HGBA1C in the last 72 hours. Fasting Lipid Panel No results for input(s): CHOL, HDL, LDLCALC, TRIG, CHOLHDL, LDLDIRECT in the last 72 hours. Thyroid Function Tests No results for input(s): TSH, T4TOTAL, T3FREE, THYROIDAB in the last 72 hours.  Invalid input(s): FREET3 _____________  Dg Chest 2 View  Result Date: 03/25/2017 CLINICAL DATA:  Chest pain radiating to left arm beginning this morning. Shortness breath, diaphoresis, and nausea. EXAM: CHEST  2 VIEW COMPARISON:  10/23/2016 FINDINGS: The heart size and mediastinal contours are within normal limits. Both lungs are clear. The visualized skeletal structures are unremarkable. IMPRESSION: Negative.  No active cardiopulmonary disease. Electronically Signed   By: Earle Gell M.D.   On: 03/25/2017 14:15   Disposition   Pt is being discharged home today in good condition.  Follow-up Plans & Appointments    Follow-up Information    Schedule an appointment as soon as possible for a visit with Lorelee Market, MD.   Specialty:  Family Medicine Contact information: [place] [address] [phone]        [facility] Follow up.   Specialty:  Cardiology Why:  The office will call you with a follow up appt.  Contact information: [address] [place] [phone]         Discharge Instructions    Call MD for:  redness, tenderness, or signs of infection (pain, swelling, redness, odor or green/yellow discharge around incision site)    Complete by:  As directed    Diet - low sodium heart healthy    Complete by:  As directed    Discharge instructions    Complete by:  As directed    Radial Site Care Refer to this sheet in the next few weeks. These instructions provide you with information on caring for yourself after your procedure. Your caregiver may also give you more specific instructions. Your treatment has been planned according to current medical practices, but problems sometimes occur. Call your caregiver if you have any problems or questions after your procedure. HOME CARE INSTRUCTIONS You may shower the day after the procedure.Remove the bandage (dressing) and gently wash the site with plain soap and water.Gently pat the site dry.  Do not apply powder or lotion  to the site.  Do not submerge the affected site in water for 3 to 5 days.  Inspect the site at least twice daily.  Do not flex or bend the affected arm for 24 hours.  No lifting over 5 pounds (2.3 kg) for 5 days after your procedure.  Do not drive home if you are discharged the same day of the procedure. Have someone else drive you.  You may drive 24 hours after the procedure unless otherwise instructed by your caregiver.  What to expect: Any bruising will usually fade within 1 to 2 weeks.  Blood that collects in the tissue (hematoma) may be painful to the touch. It should usually decrease in size and tenderness within 1 to 2 weeks.  SEEK IMMEDIATE MEDICAL CARE IF: You have unusual pain at the radial site.  You have redness, warmth, swelling, or pain at the radial site.  You have drainage (other than a small amount of blood on the dressing).  You have chills.  You have a fever or persistent symptoms for more than 72 hours.  You  have a fever and your symptoms suddenly get worse.  Your arm becomes pale, cool, tingly, or numb.  You have heavy bleeding from the site. Hold pressure on the site.   Increase activity slowly    Complete by:  As directed       Discharge Medications   Current Discharge Medication List    START taking these medications   Details  atorvastatin (LIPITOR) 40 MG tablet Take 1 tablet (40 mg total) by mouth daily at 6 PM. Qty: 90 tablet, Refills: 1    metoprolol tartrate (LOPRESSOR) 25 MG tablet Take 1 tablet (25 mg total) by mouth 2 (two) times daily. Qty: 60 tablet, Refills: 2      CONTINUE these medications which have NOT CHANGED   Details  ALPRAZolam (XANAX) 0.5 MG tablet Take 0.5 mg by mouth 2 (two) times daily as needed for anxiety.     buPROPion (WELLBUTRIN XL) 300 MG 24 hr tablet Take 300 mg by mouth daily. Refills: 4    levonorgestrel (MIRENA, 52 MG,) 20 MCG/24HR IUD 1 Intra Uterine Device (1 each total) by Intrauterine route once. Qty: 1 each, Refills: 0    lisinopril (PRINIVIL,ZESTRIL) 10 MG tablet Take 10 mg by mouth daily. Refills: 4      STOP taking these medications     Aspirin-Salicylamide-Caffeine (BC HEADACHE POWDER PO)      benzonatate (TESSALON PERLES) 100 MG capsule      lidocaine (LIDODERM) 5 %      traMADol (ULTRAM) 50 MG tablet           Outstanding Labs/Studies   FLP/LFTs in 6 weeks if tolerating statin  Duration of Discharge Encounter   Greater than 30 minutes including physician time.  Signed, Reino Bellis NP-C 03/29/2017, 10:31 AM

## 2017-03-28 NOTE — Interval H&P Note (Signed)
Cath Lab Visit (complete for each Cath Lab visit)  Clinical Evaluation Leading to the Procedure:   ACS: Yes.    Non-ACS:    Anginal Classification: CCS III  Anti-ischemic medical therapy: No Therapy  Non-Invasive Test Results: No non-invasive testing performed  Prior CABG: No previous CABG      History and Physical Interval Note:  03/28/2017 4:43 PM  Gina Sanchez  has presented today for surgery, with the diagnosis of n stemi  The various methods of treatment have been discussed with the patient and family. After consideration of risks, benefits and other options for treatment, the patient has consented to  Procedure(s): Left Heart Cath and Coronary Angiography (N/A) as a surgical intervention .  The patient's history has been reviewed, patient examined, no change in status, stable for surgery.  I have reviewed the patient's chart and labs.  Questions were answered to the patient's satisfaction.     [name]

## 2017-03-28 NOTE — Progress Notes (Signed)
ANTICOAGULATION CONSULT NOTE - Follow Up Consult  Pharmacy Consult for heparin Indication: chest pain/ACS  Allergies  Allergen Reactions  . Factive [Gemifloxacin] Rash    Patient Measurements: Height: 5' 7"  (170.2 cm) Weight: 211 lb 9.6 oz (96 kg) IBW/kg (Calculated) : 61.6  Vital Signs: Temp: 98.8 F (37.1 C) (07/16 0431) Temp Source: Oral (07/16 0431) BP: 111/73 (07/16 0949) Pulse Rate: 75 (07/16 0949)  Labs:  Recent Labs  03/25/17 1326  03/25/17 2342 03/26/17 0526 03/26/17 1130  03/27/17 0311 03/27/17 0920 03/28/17 0303  HGB 14.6  --   --   --   --   --  13.7  --  13.4  HCT 42.9  --   --   --   --   --  41.6  --  40.2  PLT 328  --   --   --   --   --  284  --  278  LABPROT  --   --   --   --   --   --   --   --  13.0  INR  --   --   --   --   --   --   --   --  0.98  HEPARINUNFRC  --   --   --  0.10*  --   < > 0.39 0.59 0.45  CREATININE 0.68  --   --  0.88  --   --   --   --   --   TROPONINI  --   < > <0.03 <0.03 <0.03  --   --   --   --   < > = values in this interval not displayed.  Estimated Creatinine Clearance: 91 mL/min (by C-G formula based on SCr of 0.88 mg/dL).   Medications:  Scheduled:  . aspirin EC  81 mg Oral Daily  . atorvastatin  40 mg Oral q1800  . buPROPion  300 mg Oral Daily  . lisinopril  10 mg Oral Daily  . metoprolol tartrate  25 mg Oral BID  . sodium chloride flush  3 mL Intravenous Q12H    Assessment: 51yo female on therapeutic heparin with Hg & pltc wnl.  No bleeding noted.  Pt to go to cath lab today.  Goal of Therapy:  Heparin level 0.3-0.7 units/ml Monitor platelets by anticoagulation protocol: Yes   Plan:  Continue Heparin 1650 units/hr Daily Heparin level, CBC Watch for s/s of bleeding F/U after cath  Lewie Chamber., PharmD [facility] [phone] until 4p, then [phone]

## 2017-03-28 NOTE — Progress Notes (Signed)
Pt called nursing to report her heart racing that awakened her from sleep.  Pt denied pain or any other complaints.  Upon assessment, pt heart rate 70s NSR.  BP 117/75.  Pt stated the feeling was gone. Upon telemetry review, pt was sinus tach 100-104  prior to calling.  Will continue to monitor closely.

## 2017-03-29 ENCOUNTER — Encounter (HOSPITAL_COMMUNITY): Payer: Self-pay | Admitting: Cardiovascular Disease

## 2017-03-29 DIAGNOSIS — Z720 Tobacco use: Secondary | ICD-10-CM

## 2017-03-29 LAB — BASIC METABOLIC PANEL
ANION GAP: 6 (ref 5–15)
BUN: 10 mg/dL (ref 6–20)
CO2: 23 mmol/L (ref 22–32)
Calcium: 8.8 mg/dL — ABNORMAL LOW (ref 8.9–10.3)
Chloride: 109 mmol/L (ref 101–111)
Creatinine, Ser: 0.83 mg/dL (ref 0.44–1.00)
Glucose, Bld: 96 mg/dL (ref 65–99)
POTASSIUM: 4 mmol/L (ref 3.5–5.1)
SODIUM: 138 mmol/L (ref 135–145)

## 2017-03-29 LAB — CBC
HEMATOCRIT: 38.6 % (ref 36.0–46.0)
HEMOGLOBIN: 12.8 g/dL (ref 12.0–15.0)
MCH: 28.4 pg (ref 26.0–34.0)
MCHC: 33.2 g/dL (ref 30.0–36.0)
MCV: 85.6 fL (ref 78.0–100.0)
Platelets: 290 10*3/uL (ref 150–400)
RBC: 4.51 MIL/uL (ref 3.87–5.11)
RDW: 14.3 % (ref 11.5–15.5)
WBC: 10.7 10*3/uL — ABNORMAL HIGH (ref 4.0–10.5)

## 2017-03-29 LAB — HEMOGLOBIN A1C
HEMOGLOBIN A1C: 5.7 % — AB (ref 4.8–5.6)
MEAN PLASMA GLUCOSE: 117 mg/dL

## 2017-03-29 MED ORDER — ATORVASTATIN CALCIUM 40 MG PO TABS: 40.0000 mg | ORAL_TABLET | Freq: Every day | ORAL | 1 refills | Status: DC

## 2017-03-29 MED ORDER — METOPROLOL TARTRATE 25 MG PO TABS: 25.0000 mg | ORAL_TABLET | Freq: Two times a day (BID) | ORAL | 2 refills | Status: DC

## 2017-03-29 NOTE — Progress Notes (Signed)
Progress Note  Patient Name: Gina Sanchez Date of Encounter: 03/29/2017  Primary Cardiologist: Marlou Porch   Subjective   No further chest pain. Post op day #1 cardiac catheterization revealing normal coronary arteries and normal LV function.  Inpatient Medications    Scheduled Meds: . aspirin EC  81 mg Oral Daily  . atorvastatin  40 mg Oral q1800  . buPROPion  300 mg Oral Daily  . lisinopril  10 mg Oral Daily  . metoprolol tartrate  25 mg Oral BID  . sodium chloride flush  3 mL Intravenous Q12H   Continuous Infusions: . sodium chloride     PRN Meds: sodium chloride, acetaminophen, ALPRAZolam, morphine injection, nitroGLYCERIN, ondansetron (ZOFRAN) IV, sodium chloride flush   Vital Signs    Vitals:   03/28/17 1841 03/28/17 2014 03/28/17 2201 03/29/17 0500  BP: 110/76 (!) 116/97 115/83 103/83  Pulse: 73  78 76  Resp:  18  18  Temp:  98.5 F (36.9 C)  98.4 F (36.9 C)  TempSrc:  Oral  Oral  SpO2: 100% 98%  98%  Weight:    210 lb 11.2 oz (95.6 kg)  Height:        Intake/Output Summary (Last 24 hours) at 03/29/17 0957 Last data filed at 03/29/17 6808  Gross per 24 hour  Intake             1557 ml  Output              650 ml  Net              907 ml   Filed Weights   03/27/17 1932 03/28/17 0431 03/29/17 0500  Weight: 209 lb 7 oz (95 kg) 211 lb 9.6 oz (96 kg) 210 lb 11.2 oz (95.6 kg)    Telemetry    SR - Personally Reviewed  ECG    SR - Personally Reviewed  Physical Exam   General: Well developed, well nourished, female appearing in no acute distress. Head: Normocephalic, atraumatic.  Neck: Supple without bruits, JVD. Lungs:  Resp regular and unlabored, CTA. Heart: RRR, S1, S2, no S3, S4, or murmur; no rub. Abdomen: Soft, non-tender, non-distended with normoactive bowel sounds. No hepatomegaly. No rebound/guarding. No obvious abdominal masses. Extremities: No clubbing, cyanosis, edema. Distal pedal pulses are 2+ bilaterally. Right radial artery  puncture site well-healed Neuro: Alert and oriented X 3. Moves all extremities spontaneously. Psych: Normal affect.  Labs    Chemistry  Recent Labs Lab 03/25/17 1326 03/26/17 0526 03/29/17 0255  NA 139 137 138  K 3.9 3.7 4.0  CL 107 105 109  CO2 23 23 23   GLUCOSE 122* 121* 96  BUN 8 13 10   CREATININE 0.68 0.88 0.83  CALCIUM 9.7 9.1 8.8*  GFRNONAA >60 >60 >60  GFRAA >60 >60 >60  ANIONGAP 9 9 6      Hematology  Recent Labs Lab 03/27/17 0311 03/28/17 0303 03/29/17 0255  WBC 16.4* 11.7* 10.7*  RBC 4.85 4.70 4.51  HGB 13.7 13.4 12.8  HCT 41.6 40.2 38.6  MCV 85.8 85.5 85.6  MCH 28.2 28.5 28.4  MCHC 32.9 33.3 33.2  RDW 14.4 14.3 14.3  PLT 284 278 290    Cardiac Enzymes  Recent Labs Lab 03/25/17 1628 03/25/17 2342 03/26/17 0526 03/26/17 1130  TROPONINI 0.03* <0.03 <0.03 <0.03     Recent Labs Lab 03/25/17 1344  TROPIPOC 0.00     BNPNo results for input(s): BNP, PROBNP in the last 168 hours.  DDimer   Recent Labs Lab 03/25/17 1434  DDIMER 0.32      Radiology    No results found.  Cardiac Studies   TTE: 03/27/17  Study Conclusions  - Left ventricle: The cavity size was normal. Systolic function was   normal. The estimated ejection fraction was in the range of 55%   to 60%. Wall motion was normal; there were no regional wall   motion abnormalities. Left ventricular diastolic function   parameters were normal. - Aortic valve: Transvalvular velocity was within the normal range.   There was no stenosis. There was no regurgitation. - Mitral valve: Transvalvular velocity was within the normal range.   There was no evidence for stenosis. There was trivial   regurgitation. - Left atrium: The atrium was mildly dilated. - Right ventricle: The cavity size was normal. Wall thickness was   normal. Systolic function was normal. - Atrial septum: No defect or patent foramen ovale was identified   by color flow Doppler. - Tricuspid valve: There was  trivial regurgitation. - Pulmonary arteries: Systolic pressure was within the normal   range.  Cardiac catheterization-03/28/17  Conclusion     The left ventricular systolic function is normal.  LV end diastolic pressure is normal.  The left ventricular ejection fraction is 55-65% by visual estimate.     Patient Profile     51 y.o. female with PMH of NSTEMI, tobacco use and HTN who presented to the ED with chest pain and elevated troponin.   Assessment & Plan    1. Unstable Angina: Cardiac catheterization yesterday revealed normal coronaries and normal LV function suggesting that her chest pain was noncardiac.  2. HTN: Well controlled with current therapy  3. HL: Trig 502, LDL- unable to calculate. Started on Lipitor 13m daily this admission.   4. Tobacco Use: cessation adivsed  The patient is stable for discharge home today. [place] [facility].  SAngelina Sheriff MD  03/29/2017, 9:57 AM

## 2017-03-30 ENCOUNTER — Telehealth: Payer: Self-pay

## 2017-03-30 NOTE — Telephone Encounter (Signed)
Called pt to schedule appt, VM not available.

## 2017-03-30 NOTE — Telephone Encounter (Signed)
-----   Message from Cheryln Manly, NP sent at 03/29/2017 10:28 AM EDT ----- Regarding: F/u appt Could you please arrange/call the patient with a post hospital follow up appt. Was seen by [facility] inpatient, but would like to follow up in [place]. Can be 3/4 weeks out. Thx  Ria Comment

## 2017-03-31 NOTE — Telephone Encounter (Signed)
Unable to leave message to schedule f/u

## 2017-04-05 NOTE — Telephone Encounter (Signed)
Unable to contact to schedule follow up, letter sent

## 2018-03-13 NOTE — Progress Notes (Signed)
03/14/2018 1:29 PM   Gina Sanchez 04/13/1966 [id number]  Referring provider: Lorelee Market, [place], [place] [address]  Chief Complaint  Patient presents with  . Nephrolithiasis    HPI: Patient is a 52 year old Caucasian female who was referred by [name], FNP for a right renal calculus.  She stated having right sided flank pain that radiated to the right side.  This started two months ago.  It was first a dull ache and then three weeks ago the pain started to become intense.  She describes the pain as contractions.  The pain is 10/10.   Laying down makes the pain worse.  Narcotic pain medications help the pain.  Non contrast CT performed at providers office noted a 4.5 mm nonobstructing right renal calculi.    Her UA today is positive for 3-10 RBC's.       PMH: Past Medical History:  Diagnosis Date  . Abnormal Pap smear of cervix   . Anxiety   . Depression   . Essential hypertension   . Palpitations   . Precordial chest pain     Surgical History: Past Surgical History:  Procedure Laterality Date  . CERVICAL BIOPSY  W/ LOOP ELECTRODE EXCISION    . COLONOSCOPY WITH PROPOFOL N/A 07/03/2015   Procedure: COLONOSCOPY WITH PROPOFOL;  Surgeon: Hulen Luster, MD;  Location: [facility] ENDOSCOPY;  Service: Gastroenterology;  Laterality: N/A;  . ESOPHAGOGASTRODUODENOSCOPY (EGD) WITH PROPOFOL N/A 07/03/2015   Procedure: ESOPHAGOGASTRODUODENOSCOPY (EGD) WITH PROPOFOL;  Surgeon: Hulen Luster, MD;  Location[facility] END[facility]COPY;  Service: Gastroenterology;  Laterality: N/A;  . LEFT HEART CATH AND CORONARY ANGIOGRAPHY N/A 03/28/2017   Procedure: Left Heart Cath and Coronary Angiography;  Surgeon: Lorretta Harp, MD;  Location: [facility] CV LAB;  Service: Cardiovascular;  Laterality: N/A;    Home Medications:  Allergies as of 03/14/2018      Reactions   Factive [gemifloxacin] Rash      Medication List        Accurate as of 03/14/18  1:29 PM. Always use your most  recent med list.          ALPRAZolam 0.5 MG tablet Commonly known as:  XANAX Take 0.5 mg by mouth 2 (two) times daily as needed for anxiety.   atorvastatin 40 MG tablet Commonly known as:  LIPITOR Take 1 tablet (40 mg total) by mouth daily at 6 PM.   buPROPion 300 MG 24 hr tablet Commonly known as:  WELLBUTRIN XL Take 300 mg by mouth daily.   levonorgestrel 20 MCG/24HR IUD Commonly known as:  MIRENA (52 MG) 1 Intra Uterine Device (1 each total) by Intrauterine route once.   lisinopril 10 MG tablet Commonly known as:  PRINIVIL,ZESTRIL Take 10 mg by mouth daily.   metoprolol tartrate 25 MG tablet Commonly known as:  LOPRESSOR Take 1 tablet (25 mg total) by mouth 2 (two) times daily.       Allergies:  Allergies  Allergen Reactions  . Factive [Gemifloxacin] Rash    Family History: Family History  Problem Relation Age of Onset  . Diabetes Mother        alive @ 3  . Heart disease Father        died of MI @ 59  . Osteoporosis Maternal Grandmother   . Colon cancer Maternal Grandfather   . Other Unknown        3 older brothers, alive and well.  . Breast cancer Neg Hx   .  Ovarian cancer Neg Hx     Social History:  reports that she has been smoking.  She has a 30.00 pack-year smoking history. She has never used smokeless tobacco. She reports that she drinks alcohol. She reports that she does not use drugs.  ROS: UROLOGY Frequent Urination?: Yes Hard to postpone urination?: No Burning/pain with urination?: No Get up at night to urinate?: Yes Leakage of urine?: Yes Urine stream starts and stops?: No Trouble starting stream?: No Do you have to strain to urinate?: No Blood in urine?: No Urinary tract infection?: No Sexually transmitted disease?: No Injury to kidneys or bladder?: No Painful intercourse?: No Weak stream?: No Currently pregnant?: No Vaginal bleeding?: No  Gastrointestinal Nausea?: No Vomiting?: No Indigestion/heartburn?: Yes Diarrhea?:  No Constipation?: No  Constitutional Fever: No Night sweats?: Yes Weight loss?: No Fatigue?: Yes  Skin Skin rash/lesions?: No Itching?: No  Eyes Blurred vision?: No Double vision?: No  Ears/Nose/Throat Sore throat?: No Sinus problems?: No  Hematologic/Lymphatic Swollen glands?: No Easy bruising?: No  Cardiovascular Leg swelling?: No Chest pain?: No  Respiratory Cough?: No Shortness of breath?: Yes  Endocrine Excessive thirst?: Yes  Musculoskeletal Back pain?: Yes Joint pain?: Yes  Neurological Headaches?: No Dizziness?: No  Psychologic Depression?: No Anxiety?: No  Physical Exam: BP 131/84   Pulse 76   Resp 16   Ht 5' 7"  (1.702 m)   Wt 201 lb 12.8 oz (91.5 kg)   SpO2 96%   BMI 31.61 kg/m   Constitutional:  Well nourished. Alert and oriented, No acute distress. HEENT: [place] AT, moist mucus membranes.  Trachea midline, no masses. Cardiovascular: No clubbing, cyanosis, or edema. Respiratory: Normal respiratory effort, no increased work of breathing.  12th rib on the right side has an area of thickness which is slightly tender to palpation.   GI: Abdomen is soft, non tender, non distended, no abdominal masses. Liver and spleen not palpable.  No hernias appreciated.  Stool sample for occult testing is not indicated.   GU: No CVA tenderness.  No bladder fullness or masses.   Skin: No rashes, bruises or suspicious lesions. Lymph: No cervical or inguinal adenopathy. Neurologic: Grossly intact, no focal deficits, moving all 4 extremities. Psychiatric: Normal mood and affect.  Laboratory Data: Lab Results  Component Value Date   WBC 10.7 (H) 03/29/2017   HGB 12.8 03/29/2017   HCT 38.6 03/29/2017   MCV 85.6 03/29/2017   PLT 290 03/29/2017    Lab Results  Component Value Date   CREATININE 0.83 03/29/2017    No results found for: PSA  No results found for: TESTOSTERONE  Lab Results  Component Value Date   HGBA1C 5.7 (H) 03/25/2017    No  results found for: TSH     Component Value Date/Time   CHOL 199 03/26/2017 0526   HDL 29 (L) 03/26/2017 0526   CHOLHDL 6.9 03/26/2017 0526   VLDL UNABLE TO CALCULATE IF TRIGLYCERIDE OVER 400 mg/dL 03/26/2017 0526   LDLCALC UNABLE TO CALCULATE IF TRIGLYCERIDE OVER 400 mg/dL 03/26/2017 0526    No results found for: AST No results found for: ALT No components found for: ALKALINEPHOPHATASE No components found for: BILIRUBINTOTAL  No results found for: ESTRADIOL  Urinalysis 3-10 RBC's.   See Epic.    I have reviewed the labs.   Pertinent Imaging: Contrast CT in 2016 noted a stable tiny right renal calculus and a stable left hypodensity Non contrast CT in 01/2018 noted a 4.5 mm non obstructing right renal calculi  Assessment & Plan:    1. Right renal stone CTU pending - not likely the source of the pain as it is non obstructing stone  2. Microscopic hematuria I explained to the patient that there are a number of causes that can be associated with blood in the urine, such as stones,  UTI's, damage to the urinary tract and/or cancer. At this time, I felt that the patient warranted further urologic evaluation with 3 or greater RBC's/hpf on microscopic evaluation of the urine.  The AUA guidelines state that a CT urogram is the preferred imaging study to evaluate hematuria.as the study on 01/2018 did not have contrast or evaluate the pelvis  I explained to the patient that a contrast material will be injected into a vein and that in rare instances, an allergic reaction can result and may even life threatening   The patient denies any allergies to contrast, iodine and/or seafood and is not taking metformin Her reproductive status is unknown at this time.  We will obtain a serum pregnancy test today.  The patient had the opportunity to ask questions which were answered. Based upon this discussion, the patient is willing to proceed. Therefore, I've ordered: a CT Urogram The patient will  return following all of the above for discussion of the results.  UA 3-10 RBC's Urine culture pending BUN + creatinine pending    Return for CTU report .  These notes generated with voice recognition software. I apologize for typographical errors.  Zara Council, PA-C  [facility] [address]  [place], [place] [address] [phone]

## 2018-03-14 ENCOUNTER — Ambulatory Visit: Payer: BLUE CROSS/BLUE SHIELD | Admitting: Urology

## 2018-03-14 ENCOUNTER — Encounter: Payer: Self-pay | Admitting: Urology

## 2018-03-14 VITALS — BP 131/84 | HR 76 | Resp 16 | Ht 67.0 in | Wt 201.8 lb

## 2018-03-14 DIAGNOSIS — R3129 Other microscopic hematuria: Secondary | ICD-10-CM

## 2018-03-14 DIAGNOSIS — N200 Calculus of kidney: Secondary | ICD-10-CM | POA: Diagnosis not present

## 2018-03-14 LAB — URINALYSIS, COMPLETE
BILIRUBIN UA: NEGATIVE
GLUCOSE, UA: NEGATIVE
KETONES UA: NEGATIVE
Leukocytes, UA: NEGATIVE
Nitrite, UA: NEGATIVE
PROTEIN UA: NEGATIVE
Specific Gravity, UA: 1.02 (ref 1.005–1.030)
UUROB: 0.2 mg/dL (ref 0.2–1.0)
pH, UA: 6 (ref 5.0–7.5)

## 2018-03-14 LAB — MICROSCOPIC EXAMINATION
Epithelial Cells (non renal): NONE SEEN /hpf (ref 0–10)
WBC UA: NONE SEEN /HPF (ref 0–5)

## 2018-03-15 LAB — CBC WITH DIFFERENTIAL/PLATELET
BASOS ABS: 0 10*3/uL (ref 0.0–0.2)
BASOS: 0 %
EOS (ABSOLUTE): 0.4 10*3/uL (ref 0.0–0.4)
Eos: 4 %
HEMOGLOBIN: 14.3 g/dL (ref 11.1–15.9)
Hematocrit: 42.7 % (ref 34.0–46.6)
IMMATURE GRANS (ABS): 0 10*3/uL (ref 0.0–0.1)
Immature Granulocytes: 0 %
LYMPHS: 35 %
Lymphocytes Absolute: 4.1 10*3/uL — ABNORMAL HIGH (ref 0.7–3.1)
MCH: 28.6 pg (ref 26.6–33.0)
MCHC: 33.5 g/dL (ref 31.5–35.7)
MCV: 85 fL (ref 79–97)
MONOCYTES: 5 %
Monocytes Absolute: 0.6 10*3/uL (ref 0.1–0.9)
NEUTROS ABS: 6.5 10*3/uL (ref 1.4–7.0)
Neutrophils: 56 %
Platelets: 328 10*3/uL (ref 150–450)
RBC: 5 x10E6/uL (ref 3.77–5.28)
RDW: 14.4 % (ref 12.3–15.4)
WBC: 11.7 10*3/uL — ABNORMAL HIGH (ref 3.4–10.8)

## 2018-03-15 LAB — BASIC METABOLIC PANEL
BUN/Creatinine Ratio: 22 (ref 9–23)
BUN: 17 mg/dL (ref 6–24)
CO2: 23 mmol/L (ref 20–29)
CREATININE: 0.76 mg/dL (ref 0.57–1.00)
Calcium: 9.9 mg/dL (ref 8.7–10.2)
Chloride: 104 mmol/L (ref 96–106)
GFR calc non Af Amer: 91 mL/min/{1.73_m2} (ref >59)
GFR, EST AFRICAN AMERICAN: 105 mL/min/{1.73_m2} (ref >59)
GLUCOSE: 92 mg/dL (ref 65–99)
Potassium: 4.3 mmol/L (ref 3.5–5.2)
Sodium: 142 mmol/L (ref 134–144)

## 2018-03-15 LAB — HCG, SERUM, QUALITATIVE: hCG,Beta Subunit,Qual,Serum: NEGATIVE m[IU]/mL (ref <6)

## 2018-03-17 LAB — CULTURE, URINE COMPREHENSIVE

## 2018-03-29 ENCOUNTER — Other Ambulatory Visit: Payer: Self-pay

## 2018-03-29 NOTE — Progress Notes (Signed)
03/30/2018 2:07 PM   Gina Sanchez [birth year] [id number]  Referring provider: Remi Haggard, [place], [place] [address]  Chief Complaint  Patient presents with  . Nephrolithiasis    HPI: Patient is a 52 year old Caucasian female with a right renal calculus and microscopic hematuria who presents today to discuss her CTU results.  Background history Patient is a 52 year old Caucasian female who was referred by [name], FNP for a right renal calculus.  She stated having right sided flank pain that radiated to the right side.  This started two months ago.  It was first a dull ache and then three weeks ago the pain started to become intense.  She describes the pain as contractions.  The pain is 10/10.  Laying down makes the pain worse.  Narcotic pain medications help the pain.  Non contrast CT performed at providers office noted a 4.5 mm nonobstructing right renal calculi.  Her UA was positive for 3-10 RBC's.   Urine culture is negative.   CTU on 03/24/2018 noted a 3 mm stone in the mid right kidney, no left renal stones, no ureteral stones, no hydronephrosis, a 1 cm low-attenuation lesion in the lower pole of the left kidney and a 5 mm hypodense lesion in the medial right kidney.  Today she states she is having frequency and nocturia.  She is still having the discomfort but is now more located in the right lower quadrant.  Patient denies any gross hematuria, dysuria or suprapubic/flank pain.  Patient denies any fevers, chills, nausea or vomiting.   Her UA today is positive for 3-10 RBC's.    PMH: Past Medical History:  Diagnosis Date  . Abnormal Pap smear of cervix   . Anxiety   . Depression   . Essential hypertension   . Palpitations   . Precordial chest pain     Surgical History: Past Surgical History:  Procedure Laterality Date  . CERVICAL BIOPSY  W/ LOOP ELECTRODE EXCISION    . COLONOSCOPY WITH PROPOFOL N/A 07/03/2015   Procedure:  COLONOSCOPY WITH PROPOFOL;  Surgeon: Hulen Luster, MD;  Location: [facility] ENDOSCOPY;  Service: Gastroenterology;  Laterality: N/A;  . ESOPHAGOGASTRODUODENOSCOPY (EGD) WITH PROPOFOL N/A 07/03/2015   Procedure: ESOPHAGOGASTRODUODENOSCOPY (EGD) WITH PROPOFOL;  Surgeon: Hulen Luster, MD;  Location: [facility] ENDOSCOPY;  Service: Gastroenterology;  Laterality: N/A;  . LEFT HEART CATH AND CORONARY ANGIOGRAPHY N/A 03/28/2017   Procedure: Left Heart Cath and Coronary Angiography;  Surgeon: Lorretta Harp, MD;  Location: [facility] LAB;  Service: Cardiovascular;  Laterality: N/A;    Home Medications:  Allergies as of 03/30/2018      Reactions   Factive [gemifloxacin] Rash      Medication List        Accurate as of 03/30/18  2:07 PM. Always use your most recent med list.          ALPRAZolam 0.5 MG tablet Commonly known as:  XANAX Take 0.5 mg by mouth 2 (two) times daily as needed for anxiety.   atorvastatin 40 MG tablet Commonly known as:  LIPITOR Take 1 tablet (40 mg total) by mouth daily at 6 PM.   buPROPion 300 MG 24 hr tablet Commonly known as:  WELLBUTRIN XL Take 300 mg by mouth daily.   levonorgestrel 20 MCG/24HR IUD Commonly known as:  MIRENA (52 MG) 1 Intra Uterine Device (1 each total) by Intrauterine route once.   lisinopril 10 MG tablet Commonly known as:  PRINIVIL,ZESTRIL  Take 10 mg by mouth daily.   metoprolol tartrate 25 MG tablet Commonly known as:  LOPRESSOR Take 1 tablet (25 mg total) by mouth 2 (two) times daily.       Allergies:  Allergies  Allergen Reactions  . Factive [Gemifloxacin] Rash    Family History: Family History  Problem Relation Age of Onset  . Diabetes Mother        alive @ 78  . Heart disease Father        died of MI @ 86  . Osteoporosis Maternal Grandmother   . Colon cancer Maternal Grandfather   . Other Unknown        3 older brothers, alive and well.  . Breast cancer Neg Hx   . Ovarian cancer Neg Hx     Social History:  reports that  she has been smoking.  She has a 30.00 pack-year smoking history. She has never used smokeless tobacco. She reports that she drinks alcohol. She reports that she does not use drugs.  ROS: UROLOGY Frequent Urination?: Yes Hard to postpone urination?: No Burning/pain with urination?: No Get up at night to urinate?: Yes Leakage of urine?: No Urine stream starts and stops?: No Trouble starting stream?: No Do you have to strain to urinate?: No Blood in urine?: No Urinary tract infection?: No Sexually transmitted disease?: No Injury to kidneys or bladder?: No Painful intercourse?: No Weak stream?: No Currently pregnant?: No Vaginal bleeding?: No Last menstrual period?: n  Gastrointestinal Nausea?: No Vomiting?: No Indigestion/heartburn?: No Diarrhea?: No Constipation?: No  Constitutional Fever: No Night sweats?: No Weight loss?: No Fatigue?: No  Skin Skin rash/lesions?: No Itching?: No  Eyes Blurred vision?: No Double vision?: No  Ears/Nose/Throat Sore throat?: No Sinus problems?: No  Hematologic/Lymphatic Swollen glands?: No Easy bruising?: No  Cardiovascular Leg swelling?: No Chest pain?: No  Respiratory Cough?: No Shortness of breath?: No  Endocrine Excessive thirst?: No  Musculoskeletal Back pain?: No Joint pain?: No  Neurological Headaches?: No Dizziness?: No  Psychologic Depression?: No Anxiety?: No  Physical Exam: BP 128/78   Pulse 82   Ht 5' 7"  (1.702 m)   Wt 201 lb (91.2 kg)   BMI 31.48 kg/m   Constitutional:  Well nourished. Alert and oriented, No acute distress. HEENT: [place] AT, moist mucus membranes.  Trachea midline, no masses. Cardiovascular: No clubbing, cyanosis, or edema. Respiratory: Normal respiratory effort, no increased work of breathing.   Skin: No rashes, bruises or suspicious lesions. Lymph: No cervical or inguinal adenopathy. Neurologic: Grossly intact, no focal deficits, moving all 4 extremities. Psychiatric:  Normal mood and affect.  Laboratory Data: Lab Results  Component Value Date   WBC 11.7 (H) 03/14/2018   HGB 14.3 03/14/2018   HCT 42.7 03/14/2018   MCV 85 03/14/2018   PLT 328 03/14/2018    Lab Results  Component Value Date   CREATININE 0.76 03/14/2018    No results found for: PSA  No results found for: TESTOSTERONE  Lab Results  Component Value Date   HGBA1C 5.7 (H) 03/25/2017    No results found for: TSH     Component Value Date/Time   CHOL 199 03/26/2017 0526   HDL 29 (L) 03/26/2017 0526   CHOLHDL 6.9 03/26/2017 0526   VLDL UNABLE TO CALCULATE IF TRIGLYCERIDE OVER 400 mg/dL 03/26/2017 0526   LDLCALC UNABLE TO CALCULATE IF TRIGLYCERIDE OVER 400 mg/dL 03/26/2017 0526    No results found for: AST No results found for: ALT No components found for:  ALKALINEPHOPHATASE No components found for: BILIRUBINTOTAL  No results found for: ESTRADIOL  Urinalysis 3-10 RBC's.   See Epic.    I have reviewed the labs.   Pertinent Imaging: Contrast CT in 2016 noted a stable tiny right renal calculus and a stable left hypodensity Non contrast CT in 01/2018 noted a 4.5 mm non obstructing right renal calculi CTU with 3 mm right renal stone and bilateral renal cysts; right lower ureter no completely opacified  Assessment & Plan:    1. Right renal stone 3 mm stone noted on CTU non obstructing  2. Microscopic hematuria CTU noted no explanation for the hematuria  Discussed with the patient and her husband that the right lower ureter did not opacify on the study and therefore we could not be sure if any urological tumors were present in that area.  We did discuss going to the operating room and undergoing cystoscopy with retrogrades in order to further evaluate this area and possibly address small right renal stone at that time.  I did explain to her that I did not believe that the right renal stone was the cause of her pain as it is been present for the last 3 years.  She stated  that she did not want to undergo a procedure in the operating room at this time but would like to proceed with the cystoscopy in the office as she is understandably more forecast on finding an etiology for her pain Explained that we have not ruled out bladder cancer and we need to schedule her for a cystoscopy as she is a smoker and is at high risk for bladder cancer I have explained to the patient that they will  be scheduled for a cystoscopy in our office to evaluate their bladder.  The cystoscopy consists of passing a tube with a lens up through their urethra and into their urinary bladder.   We will inject the urethra with a lidocaine gel prior to introducing the cystoscope to help with any discomfort during the procedure.   After the procedure, they might experience blood in the urine and discomfort with urination.  This will abate after the first few voids.  I have  encouraged the patient to increase water intake  during this time.  Patient denies any allergies to lidocaine.   3. Right abdominal pain If no etiology for pain is found on cystoscopy she will contact her gynecologist for further evaluation  Return for cystoscopy for hematuria .  These notes generated with voice recognition software. I apologize for typographical errors.  Zara Council, PA-C  [facility] [address]  [place], [place] [address] [phone]

## 2018-03-30 ENCOUNTER — Ambulatory Visit (INDEPENDENT_AMBULATORY_CARE_PROVIDER_SITE_OTHER): Payer: BLUE CROSS/BLUE SHIELD | Admitting: Urology

## 2018-03-30 ENCOUNTER — Encounter: Payer: Self-pay | Admitting: Urology

## 2018-03-30 VITALS — BP 128/78 | HR 82 | Ht 67.0 in | Wt 201.0 lb

## 2018-03-30 DIAGNOSIS — R3129 Other microscopic hematuria: Secondary | ICD-10-CM

## 2018-03-30 DIAGNOSIS — R1030 Lower abdominal pain, unspecified: Secondary | ICD-10-CM | POA: Diagnosis not present

## 2018-03-30 DIAGNOSIS — N200 Calculus of kidney: Secondary | ICD-10-CM | POA: Diagnosis not present

## 2018-03-30 LAB — MICROSCOPIC EXAMINATION

## 2018-03-30 LAB — URINALYSIS, COMPLETE
Bilirubin, UA: NEGATIVE
GLUCOSE, UA: NEGATIVE
KETONES UA: NEGATIVE
LEUKOCYTES UA: NEGATIVE
Nitrite, UA: NEGATIVE
Protein, UA: NEGATIVE
SPEC GRAV UA: 1.02 (ref 1.005–1.030)
Urobilinogen, Ur: 0.2 mg/dL (ref 0.2–1.0)
pH, UA: 5.5 (ref 5.0–7.5)

## 2018-04-03 LAB — CULTURE, URINE COMPREHENSIVE

## 2018-04-04 ENCOUNTER — Ambulatory Visit: Payer: BLUE CROSS/BLUE SHIELD | Admitting: Obstetrics and Gynecology

## 2018-04-04 ENCOUNTER — Encounter: Payer: Self-pay | Admitting: Obstetrics and Gynecology

## 2018-04-04 VITALS — BP 121/77 | HR 79 | Ht 67.0 in | Wt 205.9 lb

## 2018-04-04 DIAGNOSIS — R311 Benign essential microscopic hematuria: Secondary | ICD-10-CM | POA: Diagnosis not present

## 2018-04-04 DIAGNOSIS — R109 Unspecified abdominal pain: Secondary | ICD-10-CM

## 2018-04-04 DIAGNOSIS — Z975 Presence of (intrauterine) contraceptive device: Secondary | ICD-10-CM | POA: Diagnosis not present

## 2018-04-04 DIAGNOSIS — R102 Pelvic and perineal pain: Secondary | ICD-10-CM

## 2018-04-04 DIAGNOSIS — N200 Calculus of kidney: Secondary | ICD-10-CM

## 2018-04-04 NOTE — Progress Notes (Signed)
Pt stated that she has been having abd pain x 3 months.

## 2018-04-04 NOTE — Progress Notes (Signed)
GYNECOLOGY PROGRESS NOTE  Subjective:    Patient ID: Gina Sanchez, female    DOB: [birth year], 52 y.o.   MRN: [id number]  HPI  Patient is a 52 y.o. [id number] female who presents for complaints of abdominal pain for the past 3 months. Pain is in lower pelvis and flank (mostly right-sided), non-radiating.  Worsened by lying down. Patient notes that she went to a Urologist several weeks ago to rule out urologic cause as she also has microscopic hematuria. She was noted to have a right renal calculus on CT scan performed by PCP.  Patient had f/u CT Urogram ordered by Urology which was indeterminate.  Should be scheduled for a cystoscopy for further evaluation.  Also notes that the pain sometimes radiates across the upper right abdomen. Denies nausea/vomiting, or any association with meals.  Currently denies pain today. Denies constipation, diarrhea.   The following portions of the patient's history were reviewed and updated as appropriate: allergies, current medications, past family history, past medical history, past social history, past surgical history and problem list.  Review of Systems Pertinent items noted in HPI and remainder of comprehensive ROS otherwise negative.   Objective:   Blood pressure 121/77, pulse 79, height 5' 7"  (1.702 m), weight 205 lb 14.4 oz (93.4 kg). General appearance: alert and no distress Abdomen: soft, non-tender; bowel sounds normal; no masses,  no organomegaly Pelvic: external genitalia normal, rectovaginal septum normal.  Vagina without discharge.  Cervix normal appearing, no lesions and no motion tenderness. IUD threads visible, ~ 3 cm in length.  Uterus mobile, nontender, normal shape and size.  Adnexae non-palpable, nontender bilaterally.  Extremities: extremities normal, atraumatic, no cyanosis or edema Neurologic: Grossly normal    Imaging:  Acute Interface, Incoming Rad Results - 03/27/2018 10:46 AM EDT CT ABDOMEN AND PELVIS, WITHOUT AND WITH IV  CONTRAST  COMPARISON:  None.  INDICATION:  Other microscopic hematuria.  TECHNIQUE:  CT imaging was performed of the abdomen and pelvis prior to and following the uncomplicated intravenous administration of 80 mL of Iso-Vue 370 iodinated contrast. A genitourinary protocol CT was performed, with noncontrast imaging, a  nephrographic phase, and excretory phase imaging from the kidneys through the bladder. Coronal and sagittal reformatted images were provided. Radiation dose reduction was utilized (automated exposure control, mA or kV adjustment based on patient size, or  iterative image reconstruction).  FINDINGS:   - Lower Thorax: Unremarkable.  - Liver: Normal contour and attenuation.  There is a 1 cm cyst in the anterior dome of the liver. Patent hepatic veins and portal veins.  - Biliary Tree and Gallbladder: No biliary ductal dilatation.  The gallbladder is unremarkable.  - Pancreas: Normal.  - Spleen: Normal.   - Adrenal Glands: Normal.  - Kidneys: 3 mm stone in the mid right kidney. No left renal stones. No ureteral calculi. No hydronephrosis. There is a 1 cm low-attenuation lesion in the lower pole the left kidney which is endophytic and likely represents a minimally complex cyst. No  enhancing renal lesions. Sub-5 mm hypodense lesion in the medial right kidney is too small adequately characterize. No filling defects in the opacified portions of the collecting systems or ureters.  - Bladder: Unremarkable.  - Gastrointestinal Tract: No bowel obstruction or bowel wall thickening. Diverticulosis without acute diverticulitis. Normal appendix.  - Peritoneal Cavity and Retroperitoneum: No free fluid.  No free intraperitoneal air.  - Lymph Nodes: No retroperitoneal, mesenteric or pelvic lymphadenopathy.   - Vasculature: Normal caliber abdominal aorta.  Patent proximal aortic branch vessels.  - Reproductive: Intrauterine device in place.  - Musculoskeletal: No aggressive appearing  osseous lesions. Remote left posterior ninth rib fracture. There is minimal grade 1 anterolisthesis of L3 on L4. Multilevel degenerative disc disease.  - Miscellaneous: N/A   IMPRESSION: Nonobstructing 3 mm right renal stone. No additional findings to explain hematuria.  Electronically Signed by: Sol Passer  Assessment:   Abdominal/pelvic pain Flank pain IUD in place Microscopic hematuria Renal stone  Plan:   - Abdominal pain - unidentified source at this time. Patient notes lower abdominal/pelvic pain. No GYN cause identifiable at this time. IUD that was placed 3 years ago appears to be in place, no obvious abnormalities noted on recent CT scan with IUD (i.e. is not malpositioned), no vaginal discharge, no uterine or ovarian enlargement. No pain elicited on exam today.  Patient also noting occasional upper abdominal pain, intermittent. Discussed that if no further findings noted after Urology workup completed, patient may benefit from being seen by GI. She has had a normal upper endoscopy and colonoscopy (except diagnosis of diverticulosis) in 2016.   Discussed offering ultrasound to better visualize pelvis, ensure that the IUD is not embedding and can be causing pain (may not be visualized as well on CT scan).  - Flank pain - intermittent. Patient with h/o stable 3-mm renal stone in right kidney. Also with h/o microscopic hematuria. Has been treated for possible UTI.  Is due to be scheduled for cystoscopy.       Rubie Maid, MD [facility]

## 2018-04-05 ENCOUNTER — Encounter: Payer: Self-pay | Admitting: Obstetrics and Gynecology

## 2018-04-05 ENCOUNTER — Ambulatory Visit: Payer: BLUE CROSS/BLUE SHIELD | Admitting: Urology

## 2018-04-06 ENCOUNTER — Ambulatory Visit (INDEPENDENT_AMBULATORY_CARE_PROVIDER_SITE_OTHER): Payer: BLUE CROSS/BLUE SHIELD

## 2018-04-06 DIAGNOSIS — R102 Pelvic and perineal pain: Secondary | ICD-10-CM | POA: Diagnosis not present

## 2018-04-06 DIAGNOSIS — Z975 Presence of (intrauterine) contraceptive device: Secondary | ICD-10-CM

## 2018-04-25 ENCOUNTER — Ambulatory Visit: Payer: BLUE CROSS/BLUE SHIELD | Admitting: Urology

## 2018-04-25 ENCOUNTER — Encounter

## 2018-04-25 ENCOUNTER — Encounter: Payer: Self-pay | Admitting: Urology

## 2018-04-25 VITALS — BP 121/80 | HR 87 | Ht 67.0 in | Wt 206.3 lb

## 2018-04-25 DIAGNOSIS — R3129 Other microscopic hematuria: Secondary | ICD-10-CM | POA: Diagnosis not present

## 2018-04-25 LAB — MICROSCOPIC EXAMINATION
BACTERIA UA: NONE SEEN
Epithelial Cells (non renal): NONE SEEN /hpf (ref 0–10)
WBC, UA: NONE SEEN /hpf (ref 0–5)

## 2018-04-25 LAB — URINALYSIS, COMPLETE
Bilirubin, UA: NEGATIVE
GLUCOSE, UA: NEGATIVE
Ketones, UA: NEGATIVE
LEUKOCYTES UA: NEGATIVE
Nitrite, UA: NEGATIVE
PH UA: 5.5 (ref 5.0–7.5)
PROTEIN UA: NEGATIVE
Specific Gravity, UA: >1.03 — ABNORMAL HIGH (ref 1.005–1.030)
UUROB: 0.2 mg/dL (ref 0.2–1.0)

## 2018-04-25 MED ORDER — LIDOCAINE HCL URETHRAL/MUCOSAL 2 % EX GEL
1.0000 "application " | Freq: Once | CUTANEOUS | Status: AC
  Administered 2018-04-25: 1 via URETHRAL

## 2018-04-25 MED ORDER — SULFAMETHOXAZOLE-TRIMETHOPRIM 400-80 MG PO TABS
1.0000 | ORAL_TABLET | Freq: Once | ORAL | Status: AC
  Administered 2018-04-25: 1 via ORAL

## 2018-04-25 NOTE — Progress Notes (Signed)
Cystoscopy Procedure Note:  Indication: Microscopic hematuria  After informed consent and discussion of the procedure and its risks, Gina Sanchez was positioned and prepped in the standard fashion. Cystoscopy was performed with the a flexible cystoscope. The urethra, bladder neck and entire bladder was visualized in a standard fashion. The ureteral orifices were visualized in their normal location and orientation. Retroflexion demonstrated a normal bladder neck. Mucosa normal throughout.  Findings: Normal cystoscopy  Imaging: CTU: 30m non-obstructing right renal stone. No hydronephrosis, renal masses, or filling defects.  Assessment and Plan: Follow up as needed  BBilley Co8/13/2019

## 2018-05-08 ENCOUNTER — Other Ambulatory Visit: Payer: BLUE CROSS/BLUE SHIELD | Admitting: Urology

## 2018-05-25 ENCOUNTER — Ambulatory Visit
Admission: RE | Admit: 2018-05-25 | Discharge: 2018-05-25 | Disposition: A | Discharge status: Home / Self Care | Payer: BLUE CROSS/BLUE SHIELD | Source: Ambulatory Visit | Attending: Family Medicine | Admitting: Family Medicine

## 2018-05-25 ENCOUNTER — Other Ambulatory Visit: Payer: Self-pay | Admitting: Family Medicine

## 2018-05-25 DIAGNOSIS — M545 Low back pain: Secondary | ICD-10-CM | POA: Insufficient documentation

## 2018-05-25 DIAGNOSIS — M5137 Other intervertebral disc degeneration, lumbosacral region: Secondary | ICD-10-CM | POA: Diagnosis not present

## 2018-05-25 DIAGNOSIS — M4316 Spondylolisthesis, lumbar region: Secondary | ICD-10-CM | POA: Insufficient documentation

## 2018-08-12 ENCOUNTER — Emergency Department: Payer: BLUE CROSS/BLUE SHIELD

## 2018-08-12 ENCOUNTER — Other Ambulatory Visit: Payer: Self-pay

## 2018-08-12 ENCOUNTER — Emergency Department
Admission: EM | Admit: 2018-08-12 | Discharge: 2018-08-12 | Disposition: A | Discharge status: Home / Self Care | Payer: BLUE CROSS/BLUE SHIELD | Attending: Student in an Organized Health Care Education/Training Program | Admitting: Student in an Organized Health Care Education/Training Program

## 2018-08-12 ENCOUNTER — Encounter: Payer: Self-pay | Admitting: Emergency Medicine

## 2018-08-12 DIAGNOSIS — I10 Essential (primary) hypertension: Secondary | ICD-10-CM | POA: Diagnosis not present

## 2018-08-12 DIAGNOSIS — R109 Unspecified abdominal pain: Secondary | ICD-10-CM | POA: Insufficient documentation

## 2018-08-12 DIAGNOSIS — R1031 Right lower quadrant pain: Secondary | ICD-10-CM | POA: Insufficient documentation

## 2018-08-12 DIAGNOSIS — F17210 Nicotine dependence, cigarettes, uncomplicated: Secondary | ICD-10-CM | POA: Diagnosis not present

## 2018-08-12 DIAGNOSIS — M545 Low back pain: Secondary | ICD-10-CM | POA: Insufficient documentation

## 2018-08-12 DIAGNOSIS — Z79899 Other long term (current) drug therapy: Secondary | ICD-10-CM | POA: Insufficient documentation

## 2018-08-12 LAB — URINALYSIS, COMPLETE (UACMP) WITH MICROSCOPIC
Bacteria, UA: NONE SEEN
Bilirubin Urine: NEGATIVE
Glucose, UA: NEGATIVE mg/dL
Ketones, ur: NEGATIVE mg/dL
LEUKOCYTES UA: NEGATIVE
Nitrite: NEGATIVE
PH: 5 (ref 5.0–8.0)
Protein, ur: NEGATIVE mg/dL
SPECIFIC GRAVITY, URINE: 1.021 (ref 1.005–1.030)

## 2018-08-12 LAB — POC URINE PREG, ED: Preg Test, Ur: NEGATIVE

## 2018-08-12 MED ORDER — HYDROCODONE-ACETAMINOPHEN 5-325 MG PO TABS: 1.0000 | ORAL_TABLET | ORAL | 0 refills | Status: DC | PRN

## 2018-08-12 MED ORDER — METHOCARBAMOL 750 MG PO TABS: 750.0000 mg | ORAL_TABLET | Freq: Four times a day (QID) | ORAL | 0 refills | Status: DC | PRN

## 2018-08-12 MED ORDER — ONDANSETRON 4 MG PO TBDP: 4.0000 mg | ORAL_TABLET | Freq: Three times a day (TID) | ORAL | 0 refills | Status: DC | PRN

## 2018-08-12 NOTE — ED Triage Notes (Signed)
Pt to ED via POV c/o right flank pain. Pt states that she has hx/o same but has been controlled. Pt states that pain started back today. Pt states that she has had nausea but denies vomiting and diarrhea. Pt denies any urinary symptoms. Pt is in NAD at this time.

## 2018-08-12 NOTE — ED Notes (Signed)
Provider at bedside

## 2018-08-12 NOTE — Discharge Instructions (Signed)
Please call and schedule follow up appointments with your PCP as well as the GI specialist.  Return to the ER for symptoms that change or worsen if unable to schedule an appointment.

## 2018-08-12 NOTE — ED Provider Notes (Signed)
[facility] Emergency Department Provider Note  ____________________________________________  Time seen: Approximately 3:36 PM  I have reviewed the triage vital signs and the nursing notes.   HISTORY  Chief Complaint Flank Pain    HPI Gina Sanchez is a 52 y.o. female who presents to the emergency department for treatment and evaluation of abdominal pain and flank pain.  She has been evaluated by her primary care provider, for the same over the past month or so.  She states that she has taken Vicodin or tramadol for the pain.  Today she states that she had to take 2 Vicodin which is never been the case before.  She states that the pain is in the right lower back and radiates into the right lower quadrant.  She denies vomiting or fever.  This is the same pain that she has had intermittently for the past month.  She states that she has had an internal ultrasound to rule out ovarian cysts and she has been advised that it could potentially be musculoskeletal and was supposed to start physical therapy.  She has not started physical therapy and does not have any plans to do so at this time.  She denies any hematuria or dysuria.  Past Medical History:  Diagnosis Date  . Abnormal Pap smear of cervix   . Anxiety   . Depression   . Essential hypertension   . Palpitations   . Precordial chest pain     Patient Active Problem List   Diagnosis Date Noted  . Tobacco use 03/29/2017  . Unstable angina ([place]) 03/25/2017  . Essential hypertension   . Hypercholesteremia   . Precordial chest pain   . Palpitations     Past Surgical History:  Procedure Laterality Date  . CERVICAL BIOPSY  W/ LOOP ELECTRODE EXCISION    . COLONOSCOPY WITH PROPOFOL N/A 07/03/2015   Procedure: COLONOSCOPY WITH PROPOFOL;  Surgeon: Hulen Luster, MD;  Location: [facility] ENDOSCOPY;  Service: Gastroenterology;  Laterality: N/A;  . ESOPHAGOGASTRODUODENOSCOPY (EGD) WITH PROPOFOL N/A 07/03/2015   Procedure:  ESOPHAGOGASTRODUODENOSCOPY (EGD) WITH PROPOFOL;  Surgeon: Hulen Luster, MD;  Location: [facility] ENDOSCOPY;  Service: Gastroenterology;  Laterality: N/A;  . LEFT HEART CATH AND CORONARY ANGIOGRAPHY N/A 03/28/2017   Procedure: Left Heart Cath and Coronary Angiography;  Surgeon: Lorretta Harp, MD;  Location: [place] CV LAB;  Service: Cardiovascular;  Laterality: N/A;    Prior to Admission medications   Medication Sig Start Date End Date Taking? Authorizing Provider  ALPRAZolam Duanne Moron) 0.5 MG tablet Take 0.5 mg by mouth 2 (two) times daily as needed for anxiety.  11/29/13   [provider]  atorvastatin (LIPITOR) 40 MG tablet Take 1 tablet (40 mg total) by mouth daily at 6 PM. 03/29/17   Cheryln Manly, NP  buPROPion (WELLBUTRIN XL) 300 MG 24 hr tablet Take 300 mg by mouth daily. 02/11/17   [provider]  ezetimibe (ZETIA) 10 MG tablet Take 10 mg by mouth daily.    [provider]  HYDROcodone-acetaminophen (NORCO/VICODIN) 5-325 MG tablet Take 1 tablet by mouth every 4 (four) hours as needed for moderate pain. 08/12/18 08/12/19  Jaycub Noorani, Johnette Abraham B, FNP  levonorgestrel (MIRENA, 52 MG,) 20 MCG/24HR IUD 1 Intra Uterine Device (1 each total) by Intrauterine route once. 04/24/15   Rubie Maid, MD  lisinopril (PRINIVIL,ZESTRIL) 10 MG tablet Take 10 mg by mouth daily. 03/15/17   [provider]  methocarbamol (ROBAXIN-750) 750 MG tablet Take 1 tablet (750  mg total) by mouth every 6 (six) hours as needed for muscle spasms. 08/12/18   Mcdaniel Ohms, Johnette Abraham B, FNP  metoprolol tartrate (LOPRESSOR) 25 MG tablet Take 1 tablet (25 mg total) by mouth 2 (two) times daily. 03/29/17   Cheryln Manly, NP  nebivolol (BYSTOLIC) 10 MG tablet Take 10 mg by mouth daily.    [provider]  ondansetron (ZOFRAN-ODT) 4 MG disintegrating tablet Take 1 tablet (4 mg total) by mouth every 8 (eight) hours as needed for nausea or vomiting. 08/12/18   Victorino Dike, FNP    Allergies Factive  [gemifloxacin]  Family History  Problem Relation Age of Onset  . Diabetes Mother        alive @ 41  . Heart disease Father        died of MI @ 89  . Osteoporosis Maternal Grandmother   . Colon cancer Maternal Grandfather   . Other Unknown        3 older brothers, alive and well.  . Breast cancer Neg Hx   . Ovarian cancer Neg Hx     Social History Social History   Tobacco Use  . Smoking status: Current Every Day Smoker    Packs/day: 1.00    Years: 30.00    Pack years: 30.00  . Smokeless tobacco: Never Used  Substance Use Topics  . Alcohol use: Not Currently    Alcohol/week: 0.0 standard drinks    Comment: 1-2 drinks/week.  . Drug use: No    Review of Systems Constitutional: Negative for fever. Respiratory: Negative for shortness of breath or cough. Gastrointestinal: Positive for abdominal pain; positive for nausea , negative for vomiting. Genitourinary: Negative for dysuria , negative for vaginal discharge. Musculoskeletal: Positive for back pain. Skin: Negative for acute skin changes/rash/lesion. ____________________________________________   PHYSICAL EXAM:  VITAL SIGNS: ED Triage Vitals  Enc Vitals Group     BP 08/12/18 1509 133/75     Pulse Rate 08/12/18 1509 93     Resp 08/12/18 1509 16     Temp 08/12/18 1509 98.2 F (36.8 C)     Temp Source 08/12/18 1509 Oral     SpO2 08/12/18 1509 94 %     Weight --      Height --      Head Circumference --      Peak Flow --      Pain Score 08/12/18 1510 4     Pain Loc --      Pain Edu? --      Excl. in [place]? --     Constitutional: Alert and oriented. Well appearing and in no acute distress. Eyes: Conjunctivae are normal. Head: Atraumatic. Nose: No congestion/rhinnorhea. Mouth/Throat: Mucous membranes are moist. Respiratory: Normal respiratory effort.  No retractions. Gastrointestinal: Bowel sounds active x 4; Abdomen is soft without rebound or guarding. Genitourinary: Pelvic exam: Deferred Musculoskeletal:  No extremity tenderness nor edema.  Neurologic:  Normal speech and language. No gross focal neurologic deficits are appreciated. Speech is normal. No gait instability. Skin:  Skin is warm, dry and intact. No rash noted on exposed skin. Psychiatric: Mood and affect are normal. Speech and behavior are normal.  ____________________________________________   LABS (all labs ordered are listed, but only abnormal results are displayed)  Labs Reviewed  URINALYSIS, COMPLETE (UACMP) WITH MICROSCOPIC - Abnormal; Notable for the following components:      Result Value   Color, Urine YELLOW (*)    APPearance CLEAR (*)    Hgb urine dipstick  MODERATE (*)    All other components within normal limits  POC URINE PREG, ED   ____________________________________________  RADIOLOGY  CT abdomen and pelvis without contrast negative for acute findings per radiology. ____________________________________________  Procedures  ____________________________________________  52 year old female presenting to the emergency department for treatment and evaluation of right flank pain.  Patient urinalysis shows a moderate amount of hemoglobin, but is otherwise normal.  Patient states that she has had a cystoscopy in the past without any concerning findings.  Her CT is negative for obstructing nephro or ureterolithiasis.  There is no hydronephrosis or other acute findings including cholecystitis or appendicitis.  There is some scattered colonic diverticula without any evidence for acute diverticulitis.  Patient was instructed to call her primary care provider schedule follow-up appointment.  She is also to call GI and request a follow-up with them as well.  She was instructed to return to the emergency department for symptoms that change or worsen if she is unable to schedule an appointment.  INITIAL IMPRESSION / ASSESSMENT AND PLAN / ED COURSE  Pertinent labs & imaging results that were available during my care of the  patient were reviewed by me and considered in my medical decision making (see chart for details).  ____________________________________________   FINAL CLINICAL IMPRESSION(S) / ED DIAGNOSES  Final diagnoses:  Right flank pain  Right lower quadrant abdominal pain    Note:  This document was prepared using Dragon voice recognition software and may include unintentional dictation errors.    Victorino Dike, FNP 08/12/18 2054    Merlyn Lot, MD 08/12/18 2259

## 2018-10-20 ENCOUNTER — Encounter: Payer: BLUE CROSS/BLUE SHIELD | Admitting: Obstetrics and Gynecology

## 2018-11-03 ENCOUNTER — Encounter: Payer: Self-pay | Admitting: Obstetrics and Gynecology

## 2018-11-03 ENCOUNTER — Ambulatory Visit (INDEPENDENT_AMBULATORY_CARE_PROVIDER_SITE_OTHER): Payer: BLUE CROSS/BLUE SHIELD | Admitting: Obstetrics and Gynecology

## 2018-11-03 ENCOUNTER — Other Ambulatory Visit (HOSPITAL_COMMUNITY)
Admission: RE | Admit: 2018-11-03 | Discharge: 2018-11-03 | Disposition: A | Discharge status: Home / Self Care | Payer: BLUE CROSS/BLUE SHIELD | Source: Ambulatory Visit | Attending: Obstetrics and Gynecology | Admitting: Obstetrics and Gynecology

## 2018-11-03 VITALS — BP 143/82 | HR 90 | Ht 67.0 in | Wt 216.1 lb

## 2018-11-03 DIAGNOSIS — I10 Essential (primary) hypertension: Secondary | ICD-10-CM

## 2018-11-03 DIAGNOSIS — Z975 Presence of (intrauterine) contraceptive device: Secondary | ICD-10-CM | POA: Diagnosis not present

## 2018-11-03 DIAGNOSIS — Z01419 Encounter for gynecological examination (general) (routine) without abnormal findings: Secondary | ICD-10-CM | POA: Diagnosis present

## 2018-11-03 DIAGNOSIS — R1012 Left upper quadrant pain: Secondary | ICD-10-CM | POA: Diagnosis not present

## 2018-11-03 NOTE — Patient Instructions (Addendum)
Breast Self-Awareness Breast self-awareness means:  Knowing how your breasts look.  Knowing how your breasts feel.  Checking your breasts every month for changes.  Telling your doctor if you notice a change in your breasts. Breast self-awareness allows you to notice a breast problem early while it is still small. How to do a breast self-exam One way to learn what is normal for your breasts and to check for changes is to do a breast self-exam. To do a breast self-exam: Look for Changes  1. Take off all the clothes above your waist. 2. Stand in front of a mirror in a room with good lighting. 3. Put your hands on your hips. 4. Push your hands down. 5. Look at your breasts and nipples in the mirror to see if one breast or nipple looks different than the other. Check to see if: ? The shape of one breast is different. ? The size of one breast is different. ? There are wrinkles, dips, and bumps in one breast and not the other. 6. Look at each breast for changes in your skin, such as: ? Redness. ? Scaly areas. 7. Look for changes in your nipples, such as: ? Liquid around the nipples. ? Bleeding. ? Dimpling. ? Redness. ? A change in where the nipples are. Feel for Changes 1. Lie on your back on the floor. 2. Feel each breast. To do this, follow these steps: ? Pick a breast to feel. ? Put the arm closest to that breast above your head. ? Use your other arm to feel the nipple area of your breast. Feel the area with the pads of your three middle fingers by making small circles with your fingers. For the first circle, press lightly. For the second circle, press harder. For the third circle, press even harder. ? Keep making circles with your fingers at the light, harder, and even harder pressures as you move down your breast. Stop when you feel your ribs. ? Move your fingers a little toward the center of your body. ? Start making circles with your fingers again, this time going up until you  reach your collarbone. ? Keep making up and down circles until you reach your armpit. Remember to keep using the three pressures. ? Feel the other breast in the same way. 3. Sit or stand in the shower or tub. 4. With soapy water on your skin, feel each breast the same way you did in step 2, when you were lying on the floor. Write Down What You Find After doing the self-exam, write down:  What is normal for each breast.  Any changes you find in each breast.  When you last had your period.  How often should I check my breasts? Check your breasts every month. If you are breastfeeding, the best time to check them is after you feed your baby or after you use a breast pump. If you get periods, the best time to check your breasts is 5-7 days after your period is over. When should I see my doctor? See your doctor if you notice:  A change in shape or size of your breasts or nipples.  A change in the skin of your breast or nipples, such as red or scaly skin.  Unusual fluid coming from your nipples.  A lump or thick area that was not there before.  Pain in your breasts.  Anything that concerns you. This information is not intended to replace advice given to you by your  health care provider. Make sure you discuss any questions you have with your health care provider. Document Released: 02/16/2008 Document Revised: 02/05/2016 Document Reviewed: 07/20/2015 Elsevier Interactive Patient Education  2019 [employer]   Health Maintenance, Female Adopting a healthy lifestyle and getting preventive care can go a long way to promote health and wellness. Talk with your health care provider about what schedule of regular examinations is right for you. This is a good chance for you to check in with your provider about disease prevention and staying healthy. In between checkups, there are plenty of things you can do on your own. Experts have done a lot of research about which lifestyle changes and  preventive measures are most likely to keep you healthy. Ask your health care provider for more information. Weight and diet Eat a healthy diet  Be sure to include plenty of vegetables, fruits, low-fat dairy products, and lean protein.  Do not eat a lot of foods high in solid fats, added sugars, or salt.  Get regular exercise. This is one of the most important things you can do for your health. ? Most adults should exercise for at least 150 minutes each week. The exercise should increase your heart rate and make you sweat (moderate-intensity exercise). ? Most adults should also do strengthening exercises at least twice a week. This is in addition to the moderate-intensity exercise. Maintain a healthy weight  Body mass index (BMI) is a measurement that can be used to identify possible weight problems. It estimates body fat based on height and weight. Your health care provider can help determine your BMI and help you achieve or maintain a healthy weight.  For females 20 years of age and older: ? A BMI below 18.5 is considered underweight. ? A BMI of 18.5 to 24.9 is normal. ? A BMI of 25 to 29.9 is considered overweight. ? A BMI of 30 and above is considered obese. Watch levels of cholesterol and blood lipids  You should start having your blood tested for lipids and cholesterol at 52 years of age, then have this test every 5 years.  You may need to have your cholesterol levels checked more often if: ? Your lipid or cholesterol levels are high. ? You are older than 52 years of age. ? You are at high risk for heart disease. Cancer screening Lung Cancer  Lung cancer screening is recommended for adults 55-80 years old who are at high risk for lung cancer because of a history of smoking.  A yearly low-dose CT scan of the lungs is recommended for people who: ? Currently smoke. ? Have quit within the past 15 years. ? Have at least a 30-pack-year history of smoking. A pack year is smoking an  average of one pack of cigarettes a day for 1 year.  Yearly screening should continue until it has been 15 years since you quit.  Yearly screening should stop if you develop a health problem that would prevent you from having lung cancer treatment. Breast Cancer  Practice breast self-awareness. This means understanding how your breasts normally appear and feel.  It also means doing regular breast self-exams. Let your health care provider know about any changes, no matter how small.  If you are in your 20s or 30s, you should have a clinical breast exam (CBE) by a health care provider every 1-3 years as part of a regular health exam.  If you are 40 or older, have a CBE every year. Also consider having   a breast X-ray (mammogram) every year.  If you have a family history of breast cancer, talk to your health care provider about genetic screening.  If you are at high risk for breast cancer, talk to your health care provider about having an MRI and a mammogram every year.  Breast cancer gene (BRCA) assessment is recommended for women who have family members with BRCA-related cancers. BRCA-related cancers include: ? Breast. ? Ovarian. ? Tubal. ? Peritoneal cancers.  Results of the assessment will determine the need for genetic counseling and BRCA1 and BRCA2 testing. Cervical Cancer Your health care provider may recommend that you be screened regularly for cancer of the pelvic organs (ovaries, uterus, and vagina). This screening involves a pelvic examination, including checking for microscopic changes to the surface of your cervix (Pap test). You may be encouraged to have this screening done every 3 years, beginning at age 21.  For women ages 30-65, health care providers may recommend pelvic exams and Pap testing every 3 years, or they may recommend the Pap and pelvic exam, combined with testing for human papilloma virus (HPV), every 5 years. Some types of HPV increase your risk of cervical  cancer. Testing for HPV may also be done on women of any age with unclear Pap test results.  Other health care providers may not recommend any screening for nonpregnant women who are considered low risk for pelvic cancer and who do not have symptoms. Ask your health care provider if a screening pelvic exam is right for you.  If you have had past treatment for cervical cancer or a condition that could lead to cancer, you need Pap tests and screening for cancer for at least 20 years after your treatment. If Pap tests have been discontinued, your risk factors (such as having a new sexual partner) need to be reassessed to determine if screening should resume. Some women have medical problems that increase the chance of getting cervical cancer. In these cases, your health care provider may recommend more frequent screening and Pap tests. Colorectal Cancer  This type of cancer can be detected and often prevented.  Routine colorectal cancer screening usually begins at 52 years of age and continues through 52 years of age.  Your health care provider may recommend screening at an earlier age if you have risk factors for colon cancer.  Your health care provider may also recommend using home test kits to check for hidden blood in the stool.  A small camera at the end of a tube can be used to examine your colon directly (sigmoidoscopy or colonoscopy). This is done to check for the earliest forms of colorectal cancer.  Routine screening usually begins at age 50.  Direct examination of the colon should be repeated every 5-10 years through 52 years of age. However, you may need to be screened more often if early forms of precancerous polyps or small growths are found. Skin Cancer  Check your skin from head to toe regularly.  Tell your health care provider about any new moles or changes in moles, especially if there is a change in a mole's shape or color.  Also tell your health care provider if you have a  mole that is larger than the size of a pencil eraser.  Always use sunscreen. Apply sunscreen liberally and repeatedly throughout the day.  Protect yourself by wearing long sleeves, pants, a wide-brimmed hat, and sunglasses whenever you are outside. Heart disease, diabetes, and high blood pressure  High blood pressure causes   heart disease and increases the risk of stroke. High blood pressure is more likely to develop in: ? People who have blood pressure in the high end of the normal range (130-139/85-89 mm Hg). ? People who are overweight or obese. ? People who are African American.  If you are 18-39 years of age, have your blood pressure checked every 3-5 years. If you are 40 years of age or older, have your blood pressure checked every year. You should have your blood pressure measured twice-once when you are at a hospital or clinic, and once when you are not at a hospital or clinic. Record the average of the two measurements. To check your blood pressure when you are not at a hospital or clinic, you can use: ? An automated blood pressure machine at a pharmacy. ? A home blood pressure monitor.  If you are between 55 years and 79 years old, ask your health care provider if you should take aspirin to prevent strokes.  Have regular diabetes screenings. This involves taking a blood sample to check your fasting blood sugar level. ? If you are at a normal weight and have a low risk for diabetes, have this test once every three years after 52 years of age. ? If you are overweight and have a high risk for diabetes, consider being tested at a younger age or more often. Preventing infection Hepatitis B  If you have a higher risk for hepatitis B, you should be screened for this virus. You are considered at high risk for hepatitis B if: ? You were born in a country where hepatitis B is common. Ask your health care provider which countries are considered high risk. ? Your parents were born in a  high-risk country, and you have not been immunized against hepatitis B (hepatitis B vaccine). ? You have HIV or AIDS. ? You use needles to inject street drugs. ? You live with someone who has hepatitis B. ? You have had sex with someone who has hepatitis B. ? You get hemodialysis treatment. ? You take certain medicines for conditions, including cancer, organ transplantation, and autoimmune conditions. Hepatitis C  Blood testing is recommended for: ? Everyone born from 1945 through 1965. ? Anyone with known risk factors for hepatitis C. Sexually transmitted infections (STIs)  You should be screened for sexually transmitted infections (STIs) including gonorrhea and chlamydia if: ? You are sexually active and are younger than 52 years of age. ? You are older than 52 years of age and your health care provider tells you that you are at risk for this type of infection. ? Your sexual activity has changed since you were last screened and you are at an increased risk for chlamydia or gonorrhea. Ask your health care provider if you are at risk.  If you do not have HIV, but are at risk, it may be recommended that you take a prescription medicine daily to prevent HIV infection. This is called pre-exposure prophylaxis (PrEP). You are considered at risk if: ? You are sexually active and do not regularly use condoms or know the HIV status of your partner(s). ? You take drugs by injection. ? You are sexually active with a partner who has HIV. Talk with your health care provider about whether you are at high risk of being infected with HIV. If you choose to begin PrEP, you should first be tested for HIV. You should then be tested every 3 months for as long as you are taking PrEP.   Pregnancy  If you are premenopausal and you may become pregnant, ask your health care provider about preconception counseling.  If you may become pregnant, take 400 to 800 micrograms (mcg) of folic acid every day.  If you want  to prevent pregnancy, talk to your health care provider about birth control (contraception). Osteoporosis and menopause  Osteoporosis is a disease in which the bones lose minerals and strength with aging. This can result in serious bone fractures. Your risk for osteoporosis can be identified using a bone density scan.  If you are 65 years of age or older, or if you are at risk for osteoporosis and fractures, ask your health care provider if you should be screened.  Ask your health care provider whether you should take a calcium or vitamin D supplement to lower your risk for osteoporosis.  Menopause may have certain physical symptoms and risks.  Hormone replacement therapy may reduce some of these symptoms and risks. Talk to your health care provider about whether hormone replacement therapy is right for you. Follow these instructions at home:  Schedule regular health, dental, and eye exams.  Stay current with your immunizations.  Do not use any tobacco products including cigarettes, chewing tobacco, or electronic cigarettes.  If you are pregnant, do not drink alcohol.  If you are breastfeeding, limit how much and how often you drink alcohol.  Limit alcohol intake to no more than 1 drink per day for nonpregnant women. One drink equals 12 ounces of beer, 5 ounces of wine, or 1 ounces of hard liquor.  Do not use street drugs.  Do not share needles.  Ask your health care provider for help if you need support or information about quitting drugs.  Tell your health care provider if you often feel depressed.  Tell your health care provider if you have ever been abused or do not feel safe at home. This information is not intended to replace advice given to you by your health care provider. Make sure you discuss any questions you have with your health care provider. Document Released: 03/15/2011 Document Revised: 02/05/2016 Document Reviewed: 06/03/2015 [employer] Interactive Patient  Education  2019 [employer]  

## 2018-11-03 NOTE — Progress Notes (Signed)
PT is present today for her annual exam. Pt stated that she has been doing self-breast exams monthly. Pt stated that she is doing well and denies any issues. No problems or concerns.     

## 2018-11-03 NOTE — Progress Notes (Signed)
GYNECOLOGY ANNUAL PHYSICAL EXAM PROGRESS NOTE  Subjective:    Gina Sanchez is a 53 y.o. [id number] female who presents for an annual exam. The patient has no complaints today. The patient is sexually active.The patient wears seatbelts: yes. The patient participates in regular exercise: no. Has the patient ever been transfused or tattooed?: no. The patient reports that there is not domestic violence in her life.    Gynecologic History  Menarche age: 23 No LMP recorded. (Menstrual status: IUD). Contraception: IUD (inserted 04/2015) History of STI's: Denies Last Pap: 2016. Results were: normal.  Notes remote h/o abnormal pap smears in the past with history of LEEP. Last mammogram: 12/2017. Results were: normal Last colonoscopy: 06/2015, normal except multiple diverticula present. Repeat in 10 years.    OB History  Gravida Para Term Preterm AB Living  2 2 2  0 0 2  SAB TAB Ectopic Multiple Live Births  0 0 0 0 2    # Outcome Date GA Lbr Len/2nd Weight Sex Delivery Anes PTL Lv  2 Term 1998   6 lb (2.722 kg) F Vag-Spont   LIV  1 Term 1990   6 lb (2.722 kg) M Vag-Spont   LIV    Past Medical History:  Diagnosis Date  . Abnormal Pap smear of cervix   . Anxiety   . Depression   . Essential hypertension   . Palpitations   . Precordial chest pain     Past Surgical History:  Procedure Laterality Date  . CERVICAL BIOPSY  W/ LOOP ELECTRODE EXCISION    . COLONOSCOPY WITH PROPOFOL N/A 07/03/2015   Procedure: COLONOSCOPY WITH PROPOFOL;  Surgeon: Hulen Luster, MD;  Location: [facility] ENDOSCOPY;  Service: Gastroenterology;  Laterality: N/A;  . ESOPHAGOGASTRODUODENOSCOPY (EGD) WITH PROPOFOL N/A 07/03/2015   Procedure: ESOPHAGOGASTRODUODENOSCOPY (EGD) WITH PROPOFOL;  Surgeon: Hulen Luster, MD;  Location: [facility] ENDOSCOPY;  Service: Gastroenterology;  Laterality: N/A;  . LEFT HEART CATH AND CORONARY ANGIOGRAPHY N/A 03/28/2017   Procedure: Left Heart Cath and Coronary Angiography;  Surgeon: Lorretta Harp, MD;  Location: [facility];  Service: Cardiovascular;  Laterality: N/A;    Family History  Problem Relation Age of Onset  . Diabetes Mother        alive @ 19  . Heart disease Father        died of MI @ 57  . Osteoporosis Maternal Grandmother   . Colon cancer Maternal Grandfather   . Other Other        3 older brothers, alive and well.  . Breast cancer Neg Hx   . Ovarian cancer Neg Hx     Social History   Socioeconomic History  . Marital status: Married    Spouse name: Not on file  . Number of children: Not on file  . Years of education: Not on file  . Highest education level: Not on file  Occupational History  . Not on file  Social Needs  . Financial resource strain: Not on file  . Food insecurity:    Worry: Not on file    Inability: Not on file  . Transportation needs:    Medical: Not on file    Non-medical: Not on file  Tobacco Use  . Smoking status: Current Every Day Smoker    Packs/day: 1.00    Years: 30.00    Pack years: 30.00  . Smokeless tobacco: Never Used  Substance and Sexual Activity  . Alcohol use: Not Currently  Alcohol/week: 0.0 standard drinks    Comment: 1-2 drinks/week.  . Drug use: No  . Sexual activity: Yes    Birth control/protection: I.U.D.  Lifestyle  . Physical activity:    Days per week: Not on file    Minutes per session: Not on file  . Stress: Not on file  Relationships  . Social connections:    Talks on phone: Not on file    Gets together: Not on file    Attends religious service: Not on file    Active member of club or organization: Not on file    Attends meetings of clubs or organizations: Not on file    Relationship status: Not on file  . Intimate partner violence:    Fear of current or ex partner: Not on file    Emotionally abused: Not on file    Physically abused: Not on file    Forced sexual activity: Not on file  Other Topics Concern  . Not on file  Social History Narrative   Lives in [place]  with her husband.  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    Current Outpatient Medications on File Prior to Visit  Medication Sig Dispense Refill  . ALPRAZolam (XANAX) 0.5 MG tablet Take 0.5 mg by mouth 2 (two) times daily as needed for anxiety.     Marland Kitchen ezetimibe (ZETIA) 10 MG tablet Take 10 mg by mouth daily.    Marland Kitchen levonorgestrel (MIRENA, 52 MG,) 20 MCG/24HR IUD 1 Intra Uterine Device (1 each total) by Intrauterine route once. 1 each 0  . nebivolol (BYSTOLIC) 10 MG tablet Take 10 mg by mouth daily.     No current facility-administered medications on file prior to visit.     Allergies  Allergen Reactions  . Factive [Gemifloxacin] Rash      Review of Systems Constitutional: negative for chills, fatigue, fevers and sweats Eyes: negative for irritation, redness and visual disturbance Ears, nose, mouth, throat, and face: negative for hearing loss, nasal congestion, snoring and tinnitus Respiratory: negative for asthma, cough, sputum Cardiovascular: negative for chest pain, dyspnea, exertional chest pressure/discomfort, irregular heart beat, palpitations and syncope Gastrointestinal: positive for intermittent left upper quadrant abdominal pain, feels like "pins and needles", lasts for several seconds, associated with certain movements. Negative for change in bowel habits, nausea and vomiting Genitourinary: negative for abnormal menstrual periods, genital lesions, sexual problems and vaginal discharge, dysuria and urinary incontinence Integument/breast: negative for breast lump, breast tenderness and nipple discharge Hematologic/lymphatic: negative for bleeding and easy bruising Musculoskeletal:negative for back pain and muscle weakness Neurological: negative for dizziness, headaches, vertigo and weakness Endocrine: negative for diabetic symptoms including polydipsia, polyuria and skin dryness Allergic/Immunologic: negative for hay fever and  urticaria       Objective:  Blood pressure (!) 143/82, pulse 90, height 5' 7"  (1.702 m), weight 216 lb 1.6 oz (98 kg). Body mass index is 33.85 kg/m.  General Appearance:    Alert, cooperative, no distress, appears stated age  Head:    Normocephalic, without obvious abnormality, atraumatic  Eyes:    PERRL, conjunctiva/corneas clear, EOM's intact, both eyes  Ears:    Normal external ear canals, both ears  Nose:   Nares normal, septum midline, mucosa normal, no drainage or sinus tenderness  Throat:   Lips, mucosa, and tongue normal; teeth and gums normal  Neck:   Supple, symmetrical, trachea midline, no adenopathy; thyroid: no enlargement/tenderness/nodules; no carotid bruit or JVD  Back:  Symmetric, no curvature, ROM normal, no CVA tenderness  Lungs:     Clear to auscultation bilaterally, respirations unlabored  Chest Wall:    No tenderness or deformity   Heart:    Regular rate and rhythm, S1 and S2 normal, no murmur, rub or gallop  Breast Exam:    No tenderness, masses, or nipple abnormality  Abdomen:     Soft, non-tender, bowel sounds active all four quadrants, no masses, no organomegaly.    Genitalia:    Pelvic:external genitalia normal, vagina without lesions, discharge, or tenderness, rectovaginal septum  normal. Cervix normal in appearance, no cervical motion tenderness, no adnexal masses or tenderness.  IUD threads visible 2 cm in length from external os. Uterus normal size, shape, mobile, regular contours, nontender.  Rectal:    Normal external sphincter.  No hemorrhoids appreciated. Internal exam not done.   Extremities:   Extremities normal, atraumatic, no cyanosis or edema  Pulses:   2+ and symmetric all extremities  Skin:   Skin color, texture, turgor normal, no rashes or lesions  Lymph nodes:   Cervical, supraclavicular, and axillary nodes normal  Neurologic:   CNII-XII intact, normal strength, sensation and reflexes throughout   .  Labs:  Lab Results  Component Value  Date   WBC 11.7 (H) 03/14/2018   HGB 14.3 03/14/2018   HCT 42.7 03/14/2018   MCV 85 03/14/2018   PLT 328 03/14/2018    Lab Results  Component Value Date   CREATININE 0.76 03/14/2018   BUN 17 03/14/2018   NA 142 03/14/2018   K 4.3 03/14/2018   CL 104 03/14/2018   CO2 23 03/14/2018    No results found for: ALT, AST, GGT, ALKPHOS, BILITOT  No results found for: TSH   Assessment:   Encounter for well woman exam with routine gynecological exam  IUD (intrauterine device) in place Essential hypertension Abdominal pain in LUQ  Plan:     Blood tests: None ordered.  To have labs performed by PCP. Encouraged to schedule appointment for annual exam. . Breast self exam technique reviewed and patient encouraged to perform self-exam monthly. Contraception: IUD.  Due for removal in 2021.  Discussed healthy lifestyle modifications. Mammogram ordered.  Pap smear performed today.   Flu vaccine up to date.  Colonoscopy up to date.  LUQ pain, intermittent, lasts for several seconds, associated with certain movements. Unclear etiology but patient notes she is not majorly concerned. To f/u if pain worsens in intensity, becomes more prolonged.  Has also been worked up several times for abodminal pain with negative findings.  HTN to be managed by PCP.  RTC in 1 year for annual exam.     Rubie Maid, MD [facility]

## 2018-11-08 LAB — CYTOLOGY - PAP
Diagnosis: NEGATIVE
HPV: NOT DETECTED

## 2020-01-18 ENCOUNTER — Inpatient Hospital Stay
Admission: RE | Admit: 2020-01-18 | Discharge: 2020-01-18 | Disposition: A | Discharge status: Home / Self Care | Payer: Self-pay | Source: Ambulatory Visit | Attending: *Deleted | Admitting: *Deleted

## 2020-01-18 ENCOUNTER — Other Ambulatory Visit: Payer: Self-pay | Admitting: *Deleted

## 2020-01-18 DIAGNOSIS — Z1231 Encounter for screening mammogram for malignant neoplasm of breast: Secondary | ICD-10-CM

## 2020-01-28 ENCOUNTER — Other Ambulatory Visit: Payer: Self-pay | Admitting: Family Medicine

## 2020-01-28 DIAGNOSIS — Z1231 Encounter for screening mammogram for malignant neoplasm of breast: Secondary | ICD-10-CM

## 2020-01-31 ENCOUNTER — Ambulatory Visit
Admission: RE | Admit: 2020-01-31 | Discharge: 2020-01-31 | Disposition: A | Discharge status: Home / Self Care | Payer: 59 | Source: Ambulatory Visit | Attending: Family Medicine | Admitting: Family Medicine

## 2020-01-31 DIAGNOSIS — Z1231 Encounter for screening mammogram for malignant neoplasm of breast: Secondary | ICD-10-CM | POA: Diagnosis present

## 2020-07-01 ENCOUNTER — Ambulatory Visit
Admission: RE | Admit: 2020-07-01 | Discharge: 2020-07-01 | Disposition: A | Discharge status: Home / Self Care | Payer: 59 | Attending: Adult Health | Admitting: Adult Health

## 2020-07-01 ENCOUNTER — Other Ambulatory Visit: Payer: Self-pay | Admitting: Internal Medicine

## 2020-07-01 ENCOUNTER — Other Ambulatory Visit: Payer: Self-pay | Admitting: Adult Health

## 2020-07-01 ENCOUNTER — Ambulatory Visit
Admission: RE | Admit: 2020-07-01 | Discharge: 2020-07-01 | Disposition: A | Discharge status: Home / Self Care | Payer: 59 | Source: Ambulatory Visit | Attending: Adult Health | Admitting: Adult Health

## 2020-07-01 DIAGNOSIS — R059 Cough, unspecified: Secondary | ICD-10-CM

## 2020-07-01 DIAGNOSIS — R079 Chest pain, unspecified: Secondary | ICD-10-CM

## 2020-08-06 DIAGNOSIS — M5416 Radiculopathy, lumbar region: Secondary | ICD-10-CM | POA: Insufficient documentation

## 2020-09-14 ENCOUNTER — Other Ambulatory Visit: Payer: Self-pay

## 2020-09-14 ENCOUNTER — Emergency Department: Payer: 59

## 2020-09-14 ENCOUNTER — Encounter: Payer: Self-pay | Admitting: Internal Medicine

## 2020-09-14 ENCOUNTER — Inpatient Hospital Stay
Admission: EM | Admit: 2020-09-14 | Discharge: 2020-09-17 | DRG: 481 | Disposition: A | Discharge status: Home / Self Care | Payer: 59 | Attending: Internal Medicine | Admitting: Internal Medicine

## 2020-09-14 DIAGNOSIS — S72301K Unspecified fracture of shaft of right femur, subsequent encounter for closed fracture with nonunion: Secondary | ICD-10-CM | POA: Diagnosis not present

## 2020-09-14 DIAGNOSIS — W19XXXA Unspecified fall, initial encounter: Secondary | ICD-10-CM

## 2020-09-14 DIAGNOSIS — F17210 Nicotine dependence, cigarettes, uncomplicated: Secondary | ICD-10-CM | POA: Diagnosis present

## 2020-09-14 DIAGNOSIS — R911 Solitary pulmonary nodule: Secondary | ICD-10-CM | POA: Diagnosis present

## 2020-09-14 DIAGNOSIS — Z79899 Other long term (current) drug therapy: Secondary | ICD-10-CM

## 2020-09-14 DIAGNOSIS — S72354A Nondisplaced comminuted fracture of shaft of right femur, initial encounter for closed fracture: Secondary | ICD-10-CM | POA: Diagnosis not present

## 2020-09-14 DIAGNOSIS — E042 Nontoxic multinodular goiter: Secondary | ICD-10-CM | POA: Diagnosis present

## 2020-09-14 DIAGNOSIS — Z881 Allergy status to other antibiotic agents status: Secondary | ICD-10-CM | POA: Diagnosis not present

## 2020-09-14 DIAGNOSIS — Z975 Presence of (intrauterine) contraceptive device: Secondary | ICD-10-CM

## 2020-09-14 DIAGNOSIS — F32A Depression, unspecified: Secondary | ICD-10-CM | POA: Diagnosis present

## 2020-09-14 DIAGNOSIS — D72829 Elevated white blood cell count, unspecified: Secondary | ICD-10-CM | POA: Diagnosis present

## 2020-09-14 DIAGNOSIS — R52 Pain, unspecified: Secondary | ICD-10-CM | POA: Diagnosis present

## 2020-09-14 DIAGNOSIS — C342 Malignant neoplasm of middle lobe, bronchus or lung: Secondary | ICD-10-CM | POA: Diagnosis present

## 2020-09-14 DIAGNOSIS — Z8262 Family history of osteoporosis: Secondary | ICD-10-CM | POA: Diagnosis not present

## 2020-09-14 DIAGNOSIS — I10 Essential (primary) hypertension: Secondary | ICD-10-CM | POA: Diagnosis present

## 2020-09-14 DIAGNOSIS — E785 Hyperlipidemia, unspecified: Secondary | ICD-10-CM

## 2020-09-14 DIAGNOSIS — M84451A Pathological fracture, right femur, initial encounter for fracture: Principal | ICD-10-CM | POA: Diagnosis present

## 2020-09-14 DIAGNOSIS — E279 Disorder of adrenal gland, unspecified: Secondary | ICD-10-CM | POA: Diagnosis present

## 2020-09-14 DIAGNOSIS — M25551 Pain in right hip: Secondary | ICD-10-CM | POA: Diagnosis present

## 2020-09-14 DIAGNOSIS — M899 Disorder of bone, unspecified: Secondary | ICD-10-CM | POA: Diagnosis present

## 2020-09-14 DIAGNOSIS — S7291XA Unspecified fracture of right femur, initial encounter for closed fracture: Secondary | ICD-10-CM | POA: Insufficient documentation

## 2020-09-14 DIAGNOSIS — M8440XA Pathological fracture, unspecified site, initial encounter for fracture: Secondary | ICD-10-CM

## 2020-09-14 DIAGNOSIS — R590 Localized enlarged lymph nodes: Secondary | ICD-10-CM

## 2020-09-14 DIAGNOSIS — Z20822 Contact with and (suspected) exposure to covid-19: Secondary | ICD-10-CM | POA: Diagnosis present

## 2020-09-14 DIAGNOSIS — Z419 Encounter for procedure for purposes other than remedying health state, unspecified: Secondary | ICD-10-CM

## 2020-09-14 DIAGNOSIS — F411 Generalized anxiety disorder: Secondary | ICD-10-CM | POA: Diagnosis present

## 2020-09-14 DIAGNOSIS — S72301A Unspecified fracture of shaft of right femur, initial encounter for closed fracture: Secondary | ICD-10-CM | POA: Diagnosis present

## 2020-09-14 DIAGNOSIS — M84451D Pathological fracture, right femur, subsequent encounter for fracture with routine healing: Secondary | ICD-10-CM | POA: Diagnosis not present

## 2020-09-14 DIAGNOSIS — E7800 Pure hypercholesterolemia, unspecified: Secondary | ICD-10-CM | POA: Diagnosis present

## 2020-09-14 HISTORY — DX: Unspecified fracture of right femur, initial encounter for closed fracture: S72.91XA

## 2020-09-14 LAB — COMPREHENSIVE METABOLIC PANEL
ALT: 16 U/L (ref 0–44)
AST: 16 U/L (ref 15–41)
Albumin: 3.6 g/dL (ref 3.5–5.0)
Alkaline Phosphatase: 84 U/L (ref 38–126)
Anion gap: 10 (ref 5–15)
BUN: 15 mg/dL (ref 6–20)
CO2: 24 mmol/L (ref 22–32)
Calcium: 9.1 mg/dL (ref 8.9–10.3)
Chloride: 103 mmol/L (ref 98–111)
Creatinine, Ser: 0.63 mg/dL (ref 0.44–1.00)
GFR, Estimated: >60 mL/min (ref >60)
Glucose, Bld: 118 mg/dL — ABNORMAL HIGH (ref 70–99)
Potassium: 3.7 mmol/L (ref 3.5–5.1)
Sodium: 137 mmol/L (ref 135–145)
Total Bilirubin: 1 mg/dL (ref 0.3–1.2)
Total Protein: 7 g/dL (ref 6.5–8.1)

## 2020-09-14 LAB — CBC WITH DIFFERENTIAL/PLATELET
Abs Immature Granulocytes: 0.08 10*3/uL — ABNORMAL HIGH (ref 0.00–0.07)
Basophils Absolute: 0 10*3/uL (ref 0.0–0.1)
Basophils Relative: 0 %
Eosinophils Absolute: 0.1 10*3/uL (ref 0.0–0.5)
Eosinophils Relative: 1 %
HCT: 40.6 % (ref 36.0–46.0)
Hemoglobin: 13.7 g/dL (ref 12.0–15.0)
Immature Granulocytes: 1 %
Lymphocytes Relative: 14 %
Lymphs Abs: 2.2 10*3/uL (ref 0.7–4.0)
MCH: 28.7 pg (ref 26.0–34.0)
MCHC: 33.7 g/dL (ref 30.0–36.0)
MCV: 84.9 fL (ref 80.0–100.0)
Monocytes Absolute: 1 10*3/uL (ref 0.1–1.0)
Monocytes Relative: 7 %
Neutro Abs: 12 10*3/uL — ABNORMAL HIGH (ref 1.7–7.7)
Neutrophils Relative %: 77 %
Platelets: 337 10*3/uL (ref 150–400)
RBC: 4.78 MIL/uL (ref 3.87–5.11)
RDW: 13.3 % (ref 11.5–15.5)
WBC: 15.4 10*3/uL — ABNORMAL HIGH (ref 4.0–10.5)
nRBC: 0 % (ref 0.0–0.2)

## 2020-09-14 LAB — PROTIME-INR
INR: 1.1 (ref 0.8–1.2)
Prothrombin Time: 13.7 seconds (ref 11.4–15.2)

## 2020-09-14 LAB — TROPONIN I (HIGH SENSITIVITY): Troponin I (High Sensitivity): 10 ng/L (ref <18)

## 2020-09-14 LAB — RESP PANEL BY RT-PCR (FLU A&B, COVID) ARPGX2
Influenza A by PCR: NEGATIVE
Influenza B by PCR: NEGATIVE
SARS Coronavirus 2 by RT PCR: NEGATIVE

## 2020-09-14 MED ORDER — HYDROMORPHONE HCL 1 MG/ML IJ SOLN
1.0000 mg | INTRAMUSCULAR | Status: DC | PRN
  Administered 2020-09-14 – 2020-09-15 (×6): 1 mg via INTRAVENOUS
  Filled 2020-09-14 (×6): qty 1

## 2020-09-14 MED ORDER — HYDROMORPHONE HCL 1 MG/ML IJ SOLN
0.5000 mg | Freq: Once | INTRAMUSCULAR | Status: AC
  Administered 2020-09-14: 0.5 mg via INTRAVENOUS
  Filled 2020-09-14: qty 1

## 2020-09-14 MED ORDER — NALOXONE HCL 2 MG/2ML IJ SOSY
0.4000 mg | PREFILLED_SYRINGE | INTRAMUSCULAR | Status: DC | PRN
  Filled 2020-09-14: qty 2

## 2020-09-14 MED ORDER — NEBIVOLOL HCL 10 MG PO TABS
10.0000 mg | ORAL_TABLET | Freq: Every day | ORAL | Status: DC
  Administered 2020-09-16: 10 mg via ORAL
  Filled 2020-09-14: qty 1

## 2020-09-14 MED ORDER — LORAZEPAM 2 MG/ML IJ SOLN: 0.5000 mg | INTRAMUSCULAR | Status: DC | PRN

## 2020-09-14 MED ORDER — HYDROMORPHONE HCL 1 MG/ML IJ SOLN
1.0000 mg | Freq: Once | INTRAMUSCULAR | Status: AC
  Administered 2020-09-14: 1 mg via INTRAVENOUS
  Filled 2020-09-14: qty 1

## 2020-09-14 MED ORDER — KETOROLAC TROMETHAMINE 30 MG/ML IJ SOLN
30.0000 mg | Freq: Four times a day (QID) | INTRAMUSCULAR | Status: DC | PRN
  Administered 2020-09-15 (×2): 30 mg via INTRAVENOUS
  Filled 2020-09-14 (×2): qty 1

## 2020-09-14 MED ORDER — KETOROLAC TROMETHAMINE 30 MG/ML IJ SOLN
30.0000 mg | Freq: Once | INTRAMUSCULAR | Status: AC
  Administered 2020-09-14: 30 mg via INTRAVENOUS
  Filled 2020-09-14: qty 1

## 2020-09-14 MED ORDER — CEFAZOLIN SODIUM-DEXTROSE 2-4 GM/100ML-% IV SOLN
2.0000 g | INTRAVENOUS | Status: AC
  Administered 2020-09-15: 2 g via INTRAVENOUS
  Filled 2020-09-14: qty 100

## 2020-09-14 MED ORDER — ACETAMINOPHEN 325 MG PO TABS: 650.0000 mg | ORAL_TABLET | Freq: Four times a day (QID) | ORAL | Status: DC | PRN

## 2020-09-14 MED ORDER — ACETAMINOPHEN 650 MG RE SUPP: 650.0000 mg | Freq: Four times a day (QID) | RECTAL | Status: DC | PRN

## 2020-09-14 MED ORDER — METHOCARBAMOL 1000 MG/10ML IJ SOLN
500.0000 mg | Freq: Four times a day (QID) | INTRAVENOUS | Status: DC | PRN
  Administered 2020-09-15: 500 mg via INTRAVENOUS
  Filled 2020-09-14 (×2): qty 5

## 2020-09-14 MED ORDER — LORAZEPAM 2 MG/ML IJ SOLN
1.0000 mg | Freq: Once | INTRAMUSCULAR | Status: AC
  Administered 2020-09-14: 1 mg via INTRAVENOUS
  Filled 2020-09-14: qty 1

## 2020-09-14 NOTE — Progress Notes (Signed)
Brief note regarding plan, with full H&P to follow:   55 year old female who is being admitted for acute right proximal femur fracture.  Case discussed with the on-call orthopedic surgeon, Dr. Roland Rack, who recommends admission to the hospitalist service at [facility], and will plan to evaluate the patient in the morning.  He also recommends pursuit of MRI of the right hip for further evaluation of underlying mottled lesion seen within the midshaft of the femur on presenting plain films.  As needed IV Dilaudid.  As needed IV Toradol. Prn IV Ativan.    Babs Bertin, DO Hospitalist

## 2020-09-14 NOTE — ED Notes (Signed)
Provider notified via secure chat "Dr. Velia Meyer, My name is Miquel Dunn and I am the RN taking care of this pt. PT has a right femur fracture. Pt has significant pain 9/10. Pt has received a total of 33m of IV dilaudid, with the last dose given about an hour ago with 340mof toradol. Pt states though that she still has significant pain and spasms. I am wondering if there is any other medications we can attempt to give her to help with the pain."

## 2020-09-14 NOTE — ED Notes (Signed)
Pt family to door requesting more pain meds, oriented to last given 33 min ago and timeliness, pt HOB adjusted for comfort

## 2020-09-14 NOTE — H&P (Signed)
History and Physical    PLEASE NOTE THAT DRAGON DICTATION SOFTWARE WAS USED IN THE CONSTRUCTION OF THIS NOTE.   Gina Sanchez ZGY:[id number] DOB: 1965-09-15 DOA: 09/14/2020  PCP: Remi Haggard, FNP Patient coming from: home   I have personally briefly reviewed patient's old medical records in [place]  Chief Complaint: right thigh pain  HPI: Gina Sanchez is a 55 y.o. female with medical history significant for hypertension, hyperlipidemia, generalized anxiety disorder who is admitted to [facility] on 09/14/2020 with an acute right proximal femoral shaft fracture after presenting from home to [facility] Emergency Department complaining of right thigh pain.   The patient reports approximately 1 month of right thigh/right hip pain, over which time she reports that she underwent outpatient plain films of the right hip and right femur demonstrating no evidence of acute fracture.  Over that time, she reports progressive, sharp pain involving the right thigh as well as the right hip, which worsens with ambulation.  Denies any preceding trauma or fall, and confirms that the right hip represents an native joint for her.  Earlier today, she reports rolling over in bed, at which time she heard an audible "popping" sound from her right thigh/right hip, immediately following which she developed pain of greater severity relative to that that she has been experiencing over the course of the last month.  She reports that this pain has been constant since onset, and reports exacerbation of the associated intensity with any movement of the right lower extremity or with attempts to bear weight on the RLE.  Denies any left hip discomfort.  Denies any associated acute numbness or paresthesias involving the right lower extremity.  Reports that she is on no blood thinning agents as an outpatient, including no aspirin.  Denies any recent chest pain, shortness of breath, palpitations,  diaphoresis, nausea, vomiting, presyncope, or syncope.  She also denies any orthopnea or PND.  Denies any known history of chronic heart failure.   Denies any recent subjective fever, chills, rigors, or generalized myalgias.  No recent headache, neck stiffness, rhinitis, rhinorrhea, sore throat, abdominal pain, diarrhea, or rash.  No recent traveling or known COVID-19 exposures.  Denies any recent dysuria, gross hematuria, or change in urinary urgency/frequency.     ED Course:  Vital signs in the ED were notable for the following: Temperature max 98.2; heart rate 89-93; blood pressure 158/99; respiratory rate 20; oxygen saturation 97 to 100% on room air.  Labs were notable for the following: CMP was notable for the following: Sodium 137, bicarbonate 24, creatinine 0.63.  CBC was notable for the following: White blood cell count of 15,000, hemoglobin 13.7, and platelets 337.  INR 1.1.  Screening nasopharyngeal COVID-19/influenza PCR performed in the ED this evening was found to be negative.  Plain films of the right femur showed evidence of comminuted, mildly angulated proximal right femoral shaft fracture, with question of underlying mottled lesion within the midshaft of the right femur.  EKG showed sinus rhythm with heart rate 89, nonspecific T wave inversion in V1, no evidence of ST changes, including no evidence of ST elevation.  The emergency department physician discussed the patient's case and imaging with the on-call orthopedic surgeon, Dr. Roland Rack, recommends admission to the hospitalist service at [facility], and plans to see the patient in the morning (09/15/20), with anticipation of taking the patient to the OR for definitive surgical repair. Dr. Roland Rack also recommends pursuing MRI of the right proximal femur to  further evaluate the question of underlying mottled lesion within the midshaft of the right femur identified on presenting plain films, as described above.   While in the ED, the following  were administered: Dilaudid 1 mg IV x2, Toradol 30 mg IV x1.    Review of Systems: As per HPI otherwise 10 point review of systems negative.   Past Medical History:  Diagnosis Date  . Abnormal Pap smear of cervix   . Anxiety   . Depression   . Essential hypertension   . Palpitations   . Precordial chest pain     Past Surgical History:  Procedure Laterality Date  . BREAST BIOPSY Right 2017   benign  . CERVICAL BIOPSY  W/ LOOP ELECTRODE EXCISION    . COLONOSCOPY WITH PROPOFOL N/A 07/03/2015   Procedure: COLONOSCOPY WITH PROPOFOL;  Surgeon: Hulen Luster, MD;  Location: [facility] ENDOSCOPY;  Service: Gastroenterology;  Laterality: N/A;  . ESOPHAGOGASTRODUODENOSCOPY (EGD) WITH PROPOFOL N/A 07/03/2015   Procedure: ESOPHAGOGASTRODUODENOSCOPY (EGD) WITH PROPOFOL;  Surgeon: Hulen Luster, MD;  Location: [facility] ENDOSCOPY;  Service: Gastroenterology;  Laterality: N/A;  . LEFT HEART CATH AND CORONARY ANGIOGRAPHY N/A 03/28/2017   Procedure: Left Heart Cath and Coronary Angiography;  Surgeon: Lorretta Harp, MD;  Location: [facility];  Service: Cardiovascular;  Laterality: N/A;    Social History:  reports that she has been smoking. She has a 30.00 pack-year smoking history. She has never used smokeless tobacco. She reports previous alcohol use. She reports that she does not use drugs.   Allergies  Allergen Reactions  . Factive [Gemifloxacin] Rash    Family History  Problem Relation Age of Onset  . Diabetes Mother        alive @ 54  . Heart disease Father        died of MI @ 10  . Osteoporosis Maternal Grandmother   . Colon cancer Maternal Grandfather   . Other Other        3 older brothers, alive and well.  . Breast cancer Neg Hx   . Ovarian cancer Neg Hx      Prior to Admission medications   Medication Sig Start Date End Date Taking? Authorizing Provider  ALPRAZolam Duanne Moron) 0.5 MG tablet Take 0.5 mg by mouth 2 (two) times daily as needed for anxiety.  11/29/13   [provider]  ezetimibe (ZETIA) 10 MG tablet Take 10 mg by mouth daily.    [provider]  levonorgestrel (MIRENA, 52 MG,) 20 MCG/24HR IUD 1 Intra Uterine Device (1 each total) by Intrauterine route once. 04/24/15   Rubie Maid, MD  nebivolol (BYSTOLIC) 10 MG tablet Take 10 mg by mouth daily.    [provider]     Objective    Physical Exam: Vitals:   09/14/20 1819 09/14/20 1820  BP: (!) 145/113   Pulse: 89   Resp: 20   SpO2: 100%   Weight:  86.6 kg  Height:  5' 7"  (1.702 m)    General: appears to be stated age; alert, oriented Skin: warm, dry, no rash Head:  AT/[place] Mouth:  Oral mucosa membranes appear moist, normal dentition Neck: supple; trachea midline Heart:  RRR; did not appreciate any M/R/G Lungs: CTAB, did not appreciate any wheezes, rales, or rhonchi Abdomen: + BS; soft, ND, NT Vascular: 2+ pedal pulses b/l; 2+ radial pulses b/l Extremities: no peripheral edema, no muscle wasting Neuro: sensation intact in upper and lower extremities b/l; evaluation of strength in the  right lower extremity is limited by current degree of pain control.    Labs on Admission: I have personally reviewed following labs and imaging studies  CBC: Recent Labs  Lab 09/14/20 1914  WBC 15.4*  NEUTROABS 12.0*  HGB 13.7  HCT 40.6  MCV 84.9  PLT 756   Basic Metabolic Panel: Recent Labs  Lab 09/14/20 1914  NA 137  K 3.7  CL 103  CO2 24  GLUCOSE 118*  BUN 15  CREATININE 0.63  CALCIUM 9.1   GFR: Estimated Creatinine Clearance: 91.9 mL/min (by C-G formula based on SCr of 0.63 mg/dL). Liver Function Tests: Recent Labs  Lab 09/14/20 1914  AST 16  ALT 16  ALKPHOS 84  BILITOT 1.0  PROT 7.0  ALBUMIN 3.6   No results for input(s): LIPASE, AMYLASE in the last 168 hours. No results for input(s): AMMONIA in the last 168 hours. Coagulation Profile: Recent Labs  Lab 09/14/20 1914  INR 1.1   Cardiac Enzymes: No results for input(s): CKTOTAL, CKMB,  CKMBINDEX, TROPONINI in the last 168 hours. BNP (last 3 results) No results for input(s): PROBNP in the last 8760 hours. HbA1C: No results for input(s): HGBA1C in the last 72 hours. CBG: No results for input(s): GLUCAP in the last 168 hours. Lipid Profile: No results for input(s): CHOL, HDL, LDLCALC, TRIG, CHOLHDL, LDLDIRECT in the last 72 hours. Thyroid Function Tests: No results for input(s): TSH, T4TOTAL, FREET4, T3FREE, THYROIDAB in the last 72 hours. Anemia Panel: No results for input(s): VITAMINB12, FOLATE, FERRITIN, TIBC, IRON, RETICCTPCT in the last 72 hours. Urine analysis:    Component Value Date/Time   COLORURINE YELLOW (A) 08/12/2018 1514   APPEARANCEUR CLEAR (A) 08/12/2018 1514   APPEARANCEUR Clear 04/25/2018 0925   LABSPEC 1.021 08/12/2018 1514   PHURINE 5.0 08/12/2018 1514   GLUCOSEU NEGATIVE 08/12/2018 1514   HGBUR MODERATE (A) 08/12/2018 1514   BILIRUBINUR NEGATIVE 08/12/2018 1514   BILIRUBINUR Negative 04/25/2018 [place] 08/12/2018 1514   PROTEINUR NEGATIVE 08/12/2018 1514   UROBILINOGEN 0.2 12/26/2013 1525   NITRITE NEGATIVE 08/12/2018 1514   LEUKOCYTESUR NEGATIVE 08/12/2018 1514   LEUKOCYTESUR Negative 04/25/2018 0925    Radiological Exams on Admission: DG Pelvis 1-2 Views  Result Date: 09/14/2020 CLINICAL DATA:  Thigh and leg pain EXAM: PELVIS - 1-2 VIEW COMPARISON:  None. FINDINGS: There is no evidence of pelvic fracture or diastasis. The sacroiliac joints are intact. The visualized deep pelvis is unremarkable. IMPRESSION: Negative. Electronically Signed   By: Prudencio Pair M.D.   On: 09/14/2020 19:54   DG FEMUR, MIN 2 VIEWS RIGHT  Result Date: 09/14/2020 CLINICAL DATA:  Pain for 1 month EXAM: RIGHT FEMUR 2 VIEWS COMPARISON:  None. FINDINGS: There is a comminuted mildly angulated proximal right femoral shaft fracture. There is question of underlying mottled lesion seen within the midshaft of the femur. The femoral head is still well seated  within the acetabulum. IMPRESSION: Comminuted mildly angulated proximal right femur fracture. There is question of an underlying mottled lesion. Would recommend nonemergent MRI with contrast for further evaluation. Electronically Signed   By: Prudencio Pair M.D.   On: 09/14/2020 19:53     EKG: Independently reviewed, with result as described above.    Assessment/Plan   Gina Sanchez is a 55 y.o. female with medical history significant for hypertension, hyperlipidemia, generalized anxiety disorder who is admitted to [facility] on 09/14/2020 with an acute right proximal femoral shaft fracture after presenting from home to  [facility] Emergency Department complaining of right thigh pain.   Principal Problem:   Right femoral shaft fracture (HCC) Active Problems:   Essential hypertension   Hypercholesteremia   Right hip pain   Leukocytosis   GAD (generalized anxiety disorder)    #) Acute right proximal femoral shaft fracture: In the setting of reported 1 month of progressive right thigh and right hip discomfort, with significant increase in associated intensity after nontraumatic "popping" identified by the patient this evening in the absence of any fall or trauma, with presenting plain films showing evidence of comminuted, mildly angulated proximal right femoral shaft fracture, with question of underlying mottled lesion within the midshaft of the right femur. Right lower extremity Neurovascularly intact at this time. the patient's case and imaging with the on-call orthopedic surgeon, Dr. Roland Rack, who recommends admission to the hospitalist service at [facility], and plans to see the patient in the morning (09/15/20), with anticipation of taking the patient to the OR for definitive surgical repair. Has been made NPO except for sips with meds after MN in anticipation of this. Dr. Roland Rack also recommends pursuing MRI of the right proximal femur to further evaluate the question of underlying mottled  lesion within the midshaft of the right femur, as above.  Not on any blood thinning agents as an outpatient.  Presenting INR 1.1.  Lyndel Safe Score for this patient in the context of anticipated aforementioned orthopedic surgery conveys a 0.12% perioperative risk for significant cardiac event. No evidence to suggest acutely decompensated heart failure or acute MI. Consequently, no absolute contraindications to proceeding with proposed orthopedic surgery at this time.   Plan: Formal orthopedic surgery consult for definitive surgical management, as above.  Corresponding n.p.o. at midnight, as above.  No pharmacologic anticoagulation leading up to this anticipated surgery.  As needed IV Dilaudid.  As needed IV Toradol.  Check 25 hydroxy vitamin D level.  MRI of the right proximal femur, as described above.  Repeat CBC in the morning.  Repeat BMP in the morning. Prn IV methocarbimol for muscle spasm.      #) Leukocytosis: CBC reflects white blood cell count of 15,000, which is suspected to be reactive in the setting of acute proximal right femoral shaft fracture as opposed to representing an underlying infectious process, which there is no clinical evidence at this time.  We will further evaluate with urinalysis.  Of note, screening nasopharyngeal COVID-19/influenza PCR checked in the ED today were found to be negative.  Plan: Check urinalysis, as above.  Repeat CBC with differential in the morning.      #) Essential hypertension: On Nebivolol as an outpatient.  Mildly hypertensive at this time, with suspected contribution from pain relating to acute proximal right femoral shaft fracture, as above, with plan for optimization associated discomfort, as further described above. Will resume home beta-blocker postoperatively for associated mortality benefit.  Plan: Resume home beta-blocker postoperatively, as above.  Close monitoring of ensuing blood pressure via routine vital signs.      #)  Hyperlipidemia: On Zetia.  Hold home Zetia for now in the setting of n.p.o. after midnight status, as described above.     #) Generalized anxiety disorder on as needed Xanax as an outpatient, in the absence of any scheduled SSRI or SNRI.   Plan: In the setting of current n.p.o. status, will hold home as needed Xanax.  Ordered as needed IV Ativan for anxiety.     DVT prophylaxis: scd on LLE  Code Status: Full code Family Communication:  none Disposition Plan: Per Rounding Team Consults called: Case was discussed with the on-call orthopedic surgeon, Dr. Roland Rack, as further described above Admission status: Inpatient; MedSurg    Of note, this patient was added by me to the following Admit List/Treatment Team:  armcadmits      PLEASE NOTE THAT DRAGON DICTATION SOFTWARE WAS USED IN THE CONSTRUCTION OF THIS NOTE.   [facility] Pager [phone] From 12PM- 12AM  Otherwise, please contact night-coverage  [email or web address] Password [facility]  09/14/2020, 8:34 PM

## 2020-09-14 NOTE — ED Notes (Signed)
Pt noted to have swelling to the right upper leg. Pt states she has been having pain for a while and got an injection for the pain in her back several days ago. Pt states she went to sit down and herd a pop. Pt states severe pain since. Pt unwilling to do any movement.

## 2020-09-14 NOTE — ED Triage Notes (Signed)
Pt brought in via EMS from home with right leg/ thigh pain.  Pt states she has been having pain for 1 month, was seeing ortho and receiving ortho.  Last injection was Wednesday.  Pt states she was weight bearing, as she was trying to sit, she heard and felt something pop in her right thigh.  Shortening obvious.

## 2020-09-14 NOTE — ED Provider Notes (Signed)
[facility] Emergency Department Provider Note   ____________________________________________   Event Date/Time   First MD Initiated Contact with Patient 09/14/20 1809     (approximate)  I have reviewed the triage vital signs and the nursing notes.   HISTORY  Chief Complaint Leg Pain    HPI Gina Sanchez is a 55 y.o. female with a past medical history of anxiety/depression, hypertension, and chronic low back/leg pain on oral narcotics who presents via EMS with complaints of right upper leg, thigh, and hip pain.  Patient states that she has been having this pain for the last month and has been seeing orthopedics with a recent back injection approximately 4 days prior to arrival.  Patient states that she was to bed earlier this evening and when she went to try to sit she felt a pop in her right thigh and has been complaining of 10/10, aching pain that radiates down her right leg and is worsened with any movement or palpation of the area.  Patient denies any falls, numbness/paresthesias distally, color change distally,         Past Medical History:  Diagnosis Date  . Abnormal Pap smear of cervix   . Anxiety   . Depression   . Essential hypertension   . Palpitations   . Precordial chest pain     Patient Active Problem List   Diagnosis Date Noted  . Closed fracture of right femur, unspecified fracture morphology, initial encounter ([place]) 09/14/2020  . Right femoral shaft fracture ([place]) 09/14/2020  . Right hip pain 09/14/2020  . Leukocytosis 09/14/2020  . GAD (generalized anxiety disorder) 09/14/2020  . Tobacco use 03/29/2017  . Unstable angina ([place]) 03/25/2017  . Essential hypertension   . Hypercholesteremia   . Precordial chest pain   . Palpitations     Past Surgical History:  Procedure Laterality Date  . BREAST BIOPSY Right 2017   benign  . CERVICAL BIOPSY  W/ LOOP ELECTRODE EXCISION    . COLONOSCOPY WITH PROPOFOL N/A 07/03/2015    Procedure: COLONOSCOPY WITH PROPOFOL;  Surgeon: Hulen Luster, MD;  Location: [facility] ENDOSCOPY;  Service: Gastroenterology;  Laterality: N/A;  . ESOPHAGOGASTRODUODENOSCOPY (EGD) WITH PROPOFOL N/A 07/03/2015   Procedure: ESOPHAGOGASTRODUODENOSCOPY (EGD) WITH PROPOFOL;  Surgeon: Hulen Luster, MD;  Location: [facility] ENDOSCOPY;  Service: Gastroenterology;  Laterality: N/A;  . LEFT HEART CATH AND CORONARY ANGIOGRAPHY N/A 03/28/2017   Procedure: Left Heart Cath and Coronary Angiography;  Surgeon: Lorretta Harp, MD;  Location: [place] CV LAB;  Service: Cardiovascular;  Laterality: N/A;    Prior to Admission medications   Medication Sig Start Date End Date Taking? Authorizing Provider  ALPRAZolam Duanne Moron) 0.5 MG tablet Take 0.5 mg by mouth 2 (two) times daily as needed for anxiety.  11/29/13  Yes [provider]  ezetimibe (ZETIA) 10 MG tablet Take 10 mg by mouth daily.   Yes [provider]  ibuprofen (ADVIL) 400 MG tablet Take 400 mg by mouth every 4 (four) hours as needed.   Yes [provider]  metoprolol succinate (TOPROL-XL) 25 MG 24 hr tablet Take 25 mg by mouth daily.   Yes [provider]    Allergies Factive [gemifloxacin]  Family History  Problem Relation Age of Onset  . Diabetes Mother        alive @ 66  . Heart disease Father        died of MI @ 16  . Osteoporosis Maternal Grandmother   . Colon  cancer Maternal Grandfather   . Other Other        3 older brothers, alive and well.  . Breast cancer Neg Hx   . Ovarian cancer Neg Hx     Social History Social History   Tobacco Use  . Smoking status: Current Every Day Smoker    Packs/day: 1.00    Years: 30.00    Pack years: 30.00  . Smokeless tobacco: Never Used  Vaping Use  . Vaping Use: Never used  Substance Use Topics  . Alcohol use: Not Currently    Alcohol/week: 0.0 standard drinks    Comment: 1-2 drinks/week.  . Drug use: No    Review of Systems Constitutional: No fever/chills Eyes:  No visual changes. ENT: No sore throat. Cardiovascular: Denies chest pain. Respiratory: Denies shortness of breath. Gastrointestinal: No abdominal pain.  No nausea, no vomiting.  No diarrhea. Genitourinary: Negative for dysuria. Musculoskeletal: Endorses right hip pain Skin: Negative for rash. Neurological: Negative for headaches, weakness/numbness/paresthesias in any extremity Psychiatric: Negative for suicidal ideation/homicidal ideation   ____________________________________________   PHYSICAL EXAM:  VITAL SIGNS: ED Triage Vitals  Enc Vitals Group     BP 09/14/20 1819 (!) 145/113     Pulse Rate 09/14/20 1819 89     Resp 09/14/20 1819 20     Temp --      Temp src --      SpO2 09/14/20 1819 100 %     Weight 09/14/20 1820 191 lb (86.6 kg)     Height 09/14/20 1820 5' 7"  (1.702 m)     Head Circumference --      Peak Flow --      Pain Score 09/14/20 1819 10     Pain Loc --      Pain Edu? --      Excl. in [place]? --    Constitutional: Alert and oriented. Well appearing and in no acute distress. Eyes: Conjunctivae are normal. PERRL. Head: Atraumatic. Nose: No congestion/rhinnorhea. Mouth/Throat: Mucous membranes are moist. Neck: No stridor Cardiovascular: Grossly normal heart sounds.  Good peripheral circulation. Respiratory: Normal respiratory effort.  No retractions. Gastrointestinal: Soft and nontender. No distention. Musculoskeletal: Right leg held in internal rotation and distally neurovascularly intact Neurologic:  Normal speech and language. No gross focal neurologic deficits are appreciated. Skin:  Skin is warm and dry. No rash noted. Psychiatric: Mood and affect are normal. Speech and behavior are normal.  ____________________________________________   LABS (all labs ordered are listed, but only abnormal results are displayed)  Labs Reviewed  COMPREHENSIVE METABOLIC PANEL - Abnormal; Notable for the following components:      Result Value   Glucose, Bld 118  (*)    All other components within normal limits  CBC WITH DIFFERENTIAL/PLATELET - Abnormal; Notable for the following components:   WBC 15.4 (*)    Neutro Abs 12.0 (*)    Abs Immature Granulocytes 0.08 (*)    All other components within normal limits  URINALYSIS, ROUTINE W REFLEX MICROSCOPIC - Abnormal; Notable for the following components:   Color, Urine YELLOW (*)    APPearance HAZY (*)    All other components within normal limits  CBC WITH DIFFERENTIAL/PLATELET - Abnormal; Notable for the following components:   WBC 15.6 (*)    Neutro Abs 11.3 (*)    Monocytes Absolute 1.3 (*)    Abs Immature Granulocytes 0.08 (*)    All other components within normal limits  BASIC METABOLIC PANEL - Abnormal; Notable for the following  components:   Glucose, Bld 105 (*)    Calcium 8.7 (*)    All other components within normal limits  RESP PANEL BY RT-PCR (FLU A&B, COVID) ARPGX2  PROTIME-INR  HIV ANTIBODY (ROUTINE TESTING W REFLEX)  MAGNESIUM  VITAMIN D 25 HYDROXY (VIT D DEFICIENCY, FRACTURES)  PREGNANCY, URINE  CREATININE, SERUM  BASIC METABOLIC PANEL  CBC  SURGICAL PATHOLOGY  TROPONIN I (HIGH SENSITIVITY)   ____________________________________________  EKG  ED ECG REPORT I, Naaman Plummer, the attending physician, personally viewed and interpreted this ECG.  Date: 09/14/2020 EKG Time: 1821 Rate: 89 Rhythm: normal sinus rhythm QRS Axis: normal Intervals: normal ST/T Wave abnormalities: normal Narrative Interpretation: no evidence of acute ischemia  ____________________________________________  RADIOLOGY  ED MD interpretation: 2 view x-ray of the pelvis with additional views of the fever show an impacted and laterally angulated proximal femur fracture  Official radiology report(s): DG Pelvis 1-2 Views  Result Date: 09/14/2020 CLINICAL DATA:  Thigh and leg pain EXAM: PELVIS - 1-2 VIEW COMPARISON:  None. FINDINGS: There is no evidence of pelvic fracture or diastasis. The  sacroiliac joints are intact. The visualized deep pelvis is unremarkable. IMPRESSION: Negative. Electronically Signed   By: Prudencio Pair M.D.   On: 09/14/2020 19:54   MR FEMUR RIGHT W WO CONTRAST  Result Date: 09/15/2020 CLINICAL DATA:  Right leg and thigh pain EXAM: MRI OF THE RIGHT FEMUR WITHOUT AND WITH CONTRAST TECHNIQUE: Multiplanar, multisequence MR imaging of the right was performed both before and after administration of intravenous contrast. CONTRAST:  7 mL Gadavist COMPARISON:  None. FINDINGS: Bones/Joint/Cartilage There is a comminuted impacted laterally angulated fracture of the proximal femoral shaft. There is an underlying heterogeneously enhancing intramedullary T1 dark/T2 bright osseous lesion within this region. There does appear to be possible heterogeneous hematoma seen along the proximal anteromedial aspect of the femoral shaft. No other acute osseous abnormality is seen. A small hip joint effusion is seen. Ligaments Suboptimally visualized Muscles and Tendons There is also surrounding non loculated fluid and muscular edema seen. The edema extends around the anterolateral aspect of the femur musculature and within the adductor musculature. The visualized portion of the tendons appear to be grossly intact. Soft tissues Diffuse subcutaneous and soft tissue edema seen surrounding the antro lateral aspect of the proximal femur. IMPRESSION: Comminuted impacted laterally angulated proximal femoral shaft fracture. There does appear to be a heterogeneously enhancing osseous lesion within this area which is non-specific could be osseous neoplasm or metastatic lesion. Heterogeneously enhancing hematoma , seen along the medial aspect of the proximal femoral shaft. Diffuse muscular edema seen surrounding the proximal femur. Electronically Signed   By: Prudencio Pair M.D.   On: 09/15/2020 02:23   CT CHEST ABDOMEN PELVIS W CONTRAST  Result Date: 09/15/2020 CLINICAL DATA:  Possible pathologic femoral  fracture, metastatic evaluation EXAM: CT CHEST, ABDOMEN, AND PELVIS WITH CONTRAST TECHNIQUE: Multidetector CT imaging of the chest, abdomen and pelvis was performed following the standard protocol during bolus administration of intravenous contrast. CONTRAST:  129m OMNIPAQUE IOHEXOL 300 MG/ML  SOLN COMPARISON:  None. FINDINGS: CT CHEST FINDINGS Cardiovascular: Normal heart size. No pericardial effusion. Thoracic aorta is normal in caliber. Mediastinum/Nodes: Mediastinal adenopathy is present. For example, enlarged pretracheal node measuring 2.2 x 1.5 cm; right paratracheal node measuring 2.7 x 1.3 cm. Prevascular node measuring 0.3 x 0.9 cm. There is a 1 cm nodule of the partially imaged right thyroid lobe. Esophagus is unremarkable. No hilar or axillary adenopathy. Lungs/Pleura: There is a 5  x 4 mm nodule the right middle lobe (series 4, image 83). Dependent atelectasis/scarring. No pleural effusion. Musculoskeletal: No chest wall mass or suspicious bone lesion. CT ABDOMEN PELVIS FINDINGS Hepatobiliary: Stable small cyst at the dome of the liver. Two areas of subcapsular ill-defined low-attenuation in the left hepatic lobe measuring up to 1.8 cm (series 2, image 46). Gallbladder is unremarkable. No biliary dilatation. Pancreas: Unremarkable. Spleen: Unremarkable. Adrenals/Urinary Tract: Right adrenal is unremarkable. There is a 2.8 x 1.9 cm left adrenal lesion. Too small to characterize low-attenuation lesion of the upper pole of the right kidney. Small cyst of the lower pole of the left kidney. 3 mm nonobstructing calculus of the upper pole the right kidney. Ureters are unremarkable. There is mild layering hyperdensity in the bladder. Stomach/Bowel: Stomach is within normal limits. Bowel is normal in caliber. Distal colonic diverticulosis. Normal appendix. Vascular/Lymphatic: Aortic atherosclerosis. No enlarged lymph nodes. Reproductive: Uterus and bilateral adnexa are unremarkable. Other: No ascites.  Abdominal  wall is unremarkable. Musculoskeletal: Comminuted and angulated proximal right femoral shaft fracture with adjacent hematoma as described previously. Transitional anatomy at the lumbosacral junction. IMPRESSION: Mediastinal adenopathy. 4.5 mm right middle lobe nodule. 1 cm right thyroid lobe nodule. Ultrasound is not recommended by current guidelines. (Ref: J Am Coll Radiol. 2015 Feb;12(2): 143-50). Indeterminate 2.8 cm left adrenal lesion. Two small areas of subcapsular left hepatic lobe low density, which could reflect focal fat or lesions. Layering mild hyperdensity within the bladder, which could reflect proteinaceous material or blood products. Electronically Signed   By: Macy Mis M.D.   On: 09/15/2020 09:42   DG HIP OPERATIVE UNILAT W OR W/O PELVIS RIGHT  Result Date: 09/15/2020 CLINICAL DATA:  Known pathologic right femoral fracture EXAM: OPERATIVE RIGHT HIP WITH PELVIS COMPARISON:  09/14/2020 FLUOROSCOPY TIME:  Radiation Exposure Index (as provided by the fluoroscopic device): 9.24 mGy If the device does not provide the exposure index: Fluoroscopy Time:  56 seconds Number of Acquired Images:  4 FINDINGS: Medullary rod is noted within the right femur with proximal and distal fixation screws. The previously seen fracture is well aligned. A permeative pattern is noted within the underlying bone stable from the prior exam. IMPRESSION: ORIF of right femoral fracture. Electronically Signed   By: Inez Catalina M.D.   On: 09/15/2020 16:41   DG FEMUR, MIN 2 VIEWS RIGHT  Result Date: 09/14/2020 CLINICAL DATA:  Pain for 1 month EXAM: RIGHT FEMUR 2 VIEWS COMPARISON:  None. FINDINGS: There is a comminuted mildly angulated proximal right femoral shaft fracture. There is question of underlying mottled lesion seen within the midshaft of the femur. The femoral head is still well seated within the acetabulum. IMPRESSION: Comminuted mildly angulated proximal right femur fracture. There is question of an underlying  mottled lesion. Would recommend nonemergent MRI with contrast for further evaluation. Electronically Signed   By: Prudencio Pair M.D.   On: 09/14/2020 19:53    ____________________________________________   PROCEDURES  Procedure(s) performed (including Critical Care):  .1-3 Lead EKG Interpretation Performed by: Naaman Plummer, MD Authorized by: Naaman Plummer, MD     ECG rate:  88   ECG rate assessment: normal     Rhythm: sinus rhythm     Ectopy: none     Conduction: normal       ____________________________________________   INITIAL IMPRESSION / ASSESSMENT AND PLAN / ED COURSE  As part of my medical decision making, I reviewed the following data within the [place] notes  reviewed and incorporated, Labs reviewed, EKG interpreted, Old chart reviewed, Radiograph reviewed and Notes from prior ED visits reviewed and incorporated        Patient is a 55 year old female that presents for right hip pain Workup: XR hip Findings: Right proximal femur fracture without dislocation Consult: Orthopedic Surgery (query fascia iliacus block vs continued opiate pain control), hospitalist  Patient does not currently demonstrate complications of fracture such as compartment syndrome, arterial or nerve injury.  Interventions: analgesia Disposition: Admit      ____________________________________________   FINAL CLINICAL IMPRESSION(S) / ED DIAGNOSES  Final diagnoses:  Fall  Pain  Pathologic fracture  Surgery, elective  Right femoral shaft fracture [facility])     ED Discharge Orders    None       Note:  This document was prepared using Dragon voice recognition software and may include unintentional dictation errors.   Naaman Plummer, MD 09/15/20 [phone]

## 2020-09-15 ENCOUNTER — Inpatient Hospital Stay: Payer: 59

## 2020-09-15 ENCOUNTER — Inpatient Hospital Stay: Payer: 59 | Admitting: Registered Nurse

## 2020-09-15 ENCOUNTER — Encounter: Payer: Self-pay | Admitting: Internal Medicine

## 2020-09-15 ENCOUNTER — Encounter
Admission: EM | Disposition: A | Discharge status: Home / Self Care | Payer: Self-pay | Source: Home / Self Care | Attending: Internal Medicine

## 2020-09-15 DIAGNOSIS — M84551A Pathological fracture in neoplastic disease, right femur, initial encounter for fracture: Secondary | ICD-10-CM | POA: Insufficient documentation

## 2020-09-15 DIAGNOSIS — C4021 Malignant neoplasm of long bones of right lower limb: Secondary | ICD-10-CM

## 2020-09-15 HISTORY — PX: INTRAMEDULLARY (IM) NAIL INTERTROCHANTERIC: SHX5875

## 2020-09-15 HISTORY — DX: Malignant neoplasm of long bones of right lower limb: C40.21

## 2020-09-15 LAB — URINALYSIS, ROUTINE W REFLEX MICROSCOPIC
Bilirubin Urine: NEGATIVE
Glucose, UA: NEGATIVE mg/dL
Hgb urine dipstick: NEGATIVE
Ketones, ur: NEGATIVE mg/dL
Leukocytes,Ua: NEGATIVE
Nitrite: NEGATIVE
Protein, ur: NEGATIVE mg/dL
Specific Gravity, Urine: 1.023 (ref 1.005–1.030)
pH: 5 (ref 5.0–8.0)

## 2020-09-15 LAB — BASIC METABOLIC PANEL
Anion gap: 9 (ref 5–15)
BUN: 17 mg/dL (ref 6–20)
CO2: 24 mmol/L (ref 22–32)
Calcium: 8.7 mg/dL — ABNORMAL LOW (ref 8.9–10.3)
Chloride: 104 mmol/L (ref 98–111)
Creatinine, Ser: 0.63 mg/dL (ref 0.44–1.00)
GFR, Estimated: >60 mL/min (ref >60)
Glucose, Bld: 105 mg/dL — ABNORMAL HIGH (ref 70–99)
Potassium: 3.5 mmol/L (ref 3.5–5.1)
Sodium: 137 mmol/L (ref 135–145)

## 2020-09-15 LAB — CBC WITH DIFFERENTIAL/PLATELET
Abs Immature Granulocytes: 0.08 10*3/uL — ABNORMAL HIGH (ref 0.00–0.07)
Basophils Absolute: 0.1 10*3/uL (ref 0.0–0.1)
Basophils Relative: 0 %
Eosinophils Absolute: 0.2 10*3/uL (ref 0.0–0.5)
Eosinophils Relative: 2 %
HCT: 39.2 % (ref 36.0–46.0)
Hemoglobin: 13.2 g/dL (ref 12.0–15.0)
Immature Granulocytes: 1 %
Lymphocytes Relative: 17 %
Lymphs Abs: 2.6 10*3/uL (ref 0.7–4.0)
MCH: 28.7 pg (ref 26.0–34.0)
MCHC: 33.7 g/dL (ref 30.0–36.0)
MCV: 85.2 fL (ref 80.0–100.0)
Monocytes Absolute: 1.3 10*3/uL — ABNORMAL HIGH (ref 0.1–1.0)
Monocytes Relative: 8 %
Neutro Abs: 11.3 10*3/uL — ABNORMAL HIGH (ref 1.7–7.7)
Neutrophils Relative %: 72 %
Platelets: 305 10*3/uL (ref 150–400)
RBC: 4.6 MIL/uL (ref 3.87–5.11)
RDW: 13.3 % (ref 11.5–15.5)
WBC: 15.6 10*3/uL — ABNORMAL HIGH (ref 4.0–10.5)
nRBC: 0 % (ref 0.0–0.2)

## 2020-09-15 LAB — CREATININE, SERUM
Creatinine, Ser: 0.6 mg/dL (ref 0.44–1.00)
GFR, Estimated: >60 mL/min (ref >60)

## 2020-09-15 LAB — PREGNANCY, URINE: Preg Test, Ur: NEGATIVE

## 2020-09-15 LAB — VITAMIN D 25 HYDROXY (VIT D DEFICIENCY, FRACTURES): Vit D, 25-Hydroxy: 33.22 ng/mL (ref 30–100)

## 2020-09-15 LAB — MAGNESIUM: Magnesium: 2.1 mg/dL (ref 1.7–2.4)

## 2020-09-15 LAB — HIV ANTIBODY (ROUTINE TESTING W REFLEX): HIV Screen 4th Generation wRfx: NONREACTIVE

## 2020-09-15 SURGERY — FIXATION, FRACTURE, INTERTROCHANTERIC, WITH INTRAMEDULLARY ROD
Anesthesia: General | Laterality: Right

## 2020-09-15 MED ORDER — DEXAMETHASONE SODIUM PHOSPHATE 10 MG/ML IJ SOLN
INTRAMUSCULAR | Status: DC | PRN
  Administered 2020-09-15: 10 mg via INTRAVENOUS

## 2020-09-15 MED ORDER — ONDANSETRON HCL 4 MG/2ML IJ SOLN
INTRAMUSCULAR | Status: AC
  Filled 2020-09-15: qty 2

## 2020-09-15 MED ORDER — FLEET ENEMA 7-19 GM/118ML RE ENEM: 1.0000 | ENEMA | Freq: Once | RECTAL | Status: DC | PRN

## 2020-09-15 MED ORDER — MIDAZOLAM HCL 2 MG/2ML IJ SOLN
INTRAMUSCULAR | Status: DC | PRN
  Administered 2020-09-15: 2 mg via INTRAVENOUS

## 2020-09-15 MED ORDER — DOCUSATE SODIUM 100 MG PO CAPS
100.0000 mg | ORAL_CAPSULE | Freq: Two times a day (BID) | ORAL | Status: DC
  Administered 2020-09-15 – 2020-09-17 (×4): 100 mg via ORAL
  Filled 2020-09-15 (×4): qty 1

## 2020-09-15 MED ORDER — PROPOFOL 500 MG/50ML IV EMUL
INTRAVENOUS | Status: AC
  Filled 2020-09-15: qty 50

## 2020-09-15 MED ORDER — METOCLOPRAMIDE HCL 10 MG PO TABS: 5.0000 mg | ORAL_TABLET | Freq: Three times a day (TID) | ORAL | Status: DC | PRN

## 2020-09-15 MED ORDER — LIDOCAINE HCL (CARDIAC) PF 100 MG/5ML IV SOSY
PREFILLED_SYRINGE | INTRAVENOUS | Status: DC | PRN
  Administered 2020-09-15: 100 mg via INTRAVENOUS

## 2020-09-15 MED ORDER — CEFAZOLIN SODIUM-DEXTROSE 2-4 GM/100ML-% IV SOLN
2.0000 g | Freq: Four times a day (QID) | INTRAVENOUS | Status: AC
  Administered 2020-09-15 – 2020-09-16 (×3): 2 g via INTRAVENOUS
  Filled 2020-09-15 (×3): qty 100

## 2020-09-15 MED ORDER — ONDANSETRON HCL 4 MG/2ML IJ SOLN: 4.0000 mg | Freq: Four times a day (QID) | INTRAMUSCULAR | Status: DC | PRN

## 2020-09-15 MED ORDER — CHLORHEXIDINE GLUCONATE 0.12 % MT SOLN
OROMUCOSAL | Status: AC
  Administered 2020-09-15: 15 mL via OROMUCOSAL
  Filled 2020-09-15: qty 15

## 2020-09-15 MED ORDER — DIPHENHYDRAMINE HCL 12.5 MG/5ML PO ELIX: 12.5000 mg | ORAL_SOLUTION | ORAL | Status: DC | PRN

## 2020-09-15 MED ORDER — BUPIVACAINE-EPINEPHRINE (PF) 0.5% -1:200000 IJ SOLN
INTRAMUSCULAR | Status: DC | PRN
  Administered 2020-09-15: 30 mL

## 2020-09-15 MED ORDER — HYDROMORPHONE HCL 1 MG/ML IJ SOLN
INTRAMUSCULAR | Status: AC
  Filled 2020-09-15: qty 1

## 2020-09-15 MED ORDER — MIDAZOLAM HCL 2 MG/2ML IJ SOLN
INTRAMUSCULAR | Status: AC
  Filled 2020-09-15: qty 2

## 2020-09-15 MED ORDER — SUGAMMADEX SODIUM 200 MG/2ML IV SOLN
INTRAVENOUS | Status: DC | PRN
  Administered 2020-09-15: 200 mg via INTRAVENOUS

## 2020-09-15 MED ORDER — KETOROLAC TROMETHAMINE 15 MG/ML IJ SOLN
15.0000 mg | Freq: Four times a day (QID) | INTRAMUSCULAR | Status: AC
  Administered 2020-09-15 – 2020-09-16 (×3): 15 mg via INTRAVENOUS
  Filled 2020-09-15 (×3): qty 1

## 2020-09-15 MED ORDER — OXYCODONE HCL 5 MG PO TABS: 5.0000 mg | ORAL_TABLET | Freq: Once | ORAL | Status: DC | PRN

## 2020-09-15 MED ORDER — CEFAZOLIN SODIUM-DEXTROSE 2-4 GM/100ML-% IV SOLN
INTRAVENOUS | Status: AC
  Filled 2020-09-15: qty 100

## 2020-09-15 MED ORDER — DEXMEDETOMIDINE (PRECEDEX) IN NS 20 MCG/5ML (4 MCG/ML) IV SYRINGE
PREFILLED_SYRINGE | INTRAVENOUS | Status: DC | PRN
  Administered 2020-09-15 (×5): 4 ug via INTRAVENOUS

## 2020-09-15 MED ORDER — BISACODYL 10 MG RE SUPP: 10.0000 mg | Freq: Every day | RECTAL | Status: DC | PRN

## 2020-09-15 MED ORDER — GADOBUTROL 1 MMOL/ML IV SOLN
7.0000 mL | Freq: Once | INTRAVENOUS | Status: AC | PRN
  Administered 2020-09-15: 7.5 mL via INTRAVENOUS

## 2020-09-15 MED ORDER — OXYCODONE HCL 5 MG/5ML PO SOLN: 5.0000 mg | Freq: Once | ORAL | Status: DC | PRN

## 2020-09-15 MED ORDER — SODIUM CHLORIDE 0.9 % IV SOLN: INTRAVENOUS | Status: DC

## 2020-09-15 MED ORDER — ACETAMINOPHEN 10 MG/ML IV SOLN
INTRAVENOUS | Status: DC | PRN
  Administered 2020-09-15: 1000 mg via INTRAVENOUS

## 2020-09-15 MED ORDER — ACETAMINOPHEN 500 MG PO TABS
1000.0000 mg | ORAL_TABLET | Freq: Four times a day (QID) | ORAL | Status: AC
  Administered 2020-09-15 – 2020-09-16 (×3): 1000 mg via ORAL
  Filled 2020-09-15 (×3): qty 2

## 2020-09-15 MED ORDER — ENOXAPARIN SODIUM 40 MG/0.4ML ~~LOC~~ SOLN
40.0000 mg | SUBCUTANEOUS | Status: DC
  Administered 2020-09-16 – 2020-09-17 (×2): 40 mg via SUBCUTANEOUS
  Filled 2020-09-15 (×2): qty 0.4

## 2020-09-15 MED ORDER — TRAMADOL HCL 50 MG PO TABS
50.0000 mg | ORAL_TABLET | Freq: Four times a day (QID) | ORAL | Status: DC | PRN
  Filled 2020-09-15: qty 1

## 2020-09-15 MED ORDER — FAMOTIDINE 20 MG PO TABS
ORAL_TABLET | ORAL | Status: AC
  Administered 2020-09-15: 20 mg via ORAL
  Filled 2020-09-15: qty 1

## 2020-09-15 MED ORDER — OXYCODONE HCL 5 MG PO TABS
5.0000 mg | ORAL_TABLET | ORAL | Status: DC | PRN
  Administered 2020-09-15 (×2): 5 mg via ORAL
  Administered 2020-09-16 – 2020-09-17 (×4): 10 mg via ORAL
  Filled 2020-09-15 (×2): qty 1
  Filled 2020-09-15 (×4): qty 2

## 2020-09-15 MED ORDER — DEXMEDETOMIDINE (PRECEDEX) IN NS 20 MCG/5ML (4 MCG/ML) IV SYRINGE
PREFILLED_SYRINGE | INTRAVENOUS | Status: AC
  Filled 2020-09-15: qty 5

## 2020-09-15 MED ORDER — LIDOCAINE HCL (PF) 2 % IJ SOLN
INTRAMUSCULAR | Status: AC
  Filled 2020-09-15: qty 5

## 2020-09-15 MED ORDER — FAMOTIDINE 20 MG PO TABS: 20.0000 mg | ORAL_TABLET | Freq: Once | ORAL | Status: AC

## 2020-09-15 MED ORDER — MAGNESIUM HYDROXIDE 400 MG/5ML PO SUSP: 30.0000 mL | Freq: Every day | ORAL | Status: DC | PRN

## 2020-09-15 MED ORDER — EPHEDRINE 5 MG/ML INJ
INTRAVENOUS | Status: AC
  Filled 2020-09-15: qty 10

## 2020-09-15 MED ORDER — PROPOFOL 10 MG/ML IV BOLUS
INTRAVENOUS | Status: DC | PRN
  Administered 2020-09-15: 150 mg via INTRAVENOUS

## 2020-09-15 MED ORDER — ROCURONIUM BROMIDE 100 MG/10ML IV SOLN
INTRAVENOUS | Status: DC | PRN
  Administered 2020-09-15: 50 mg via INTRAVENOUS
  Administered 2020-09-15: 20 mg via INTRAVENOUS
  Administered 2020-09-15: 10 mg via INTRAVENOUS

## 2020-09-15 MED ORDER — ALPRAZOLAM 0.5 MG PO TABS
0.5000 mg | ORAL_TABLET | Freq: Two times a day (BID) | ORAL | Status: DC | PRN
  Administered 2020-09-15 – 2020-09-17 (×5): 0.5 mg via ORAL
  Filled 2020-09-15 (×5): qty 1

## 2020-09-15 MED ORDER — METOCLOPRAMIDE HCL 5 MG/ML IJ SOLN: 5.0000 mg | Freq: Three times a day (TID) | INTRAMUSCULAR | Status: DC | PRN

## 2020-09-15 MED ORDER — ACETAMINOPHEN 325 MG PO TABS
325.0000 mg | ORAL_TABLET | Freq: Four times a day (QID) | ORAL | Status: DC | PRN
  Administered 2020-09-17 (×2): 650 mg via ORAL
  Filled 2020-09-15 (×2): qty 2

## 2020-09-15 MED ORDER — PHENYLEPHRINE HCL (PRESSORS) 10 MG/ML IV SOLN
INTRAVENOUS | Status: DC | PRN
  Administered 2020-09-15 (×2): 50 ug via INTRAVENOUS
  Administered 2020-09-15 (×4): 100 ug via INTRAVENOUS

## 2020-09-15 MED ORDER — FENTANYL CITRATE (PF) 100 MCG/2ML IJ SOLN
INTRAMUSCULAR | Status: AC
  Filled 2020-09-15: qty 2

## 2020-09-15 MED ORDER — ONDANSETRON HCL 4 MG PO TABS
4.0000 mg | ORAL_TABLET | Freq: Four times a day (QID) | ORAL | Status: DC | PRN
  Administered 2020-09-16: 4 mg via ORAL
  Filled 2020-09-15: qty 1

## 2020-09-15 MED ORDER — DEXAMETHASONE SODIUM PHOSPHATE 10 MG/ML IJ SOLN
INTRAMUSCULAR | Status: AC
  Filled 2020-09-15: qty 1

## 2020-09-15 MED ORDER — HYDROMORPHONE HCL 1 MG/ML IJ SOLN
INTRAMUSCULAR | Status: DC | PRN
  Administered 2020-09-15 (×2): .5 mg via INTRAVENOUS

## 2020-09-15 MED ORDER — IOHEXOL 300 MG/ML  SOLN
100.0000 mL | Freq: Once | INTRAMUSCULAR | Status: AC | PRN
  Administered 2020-09-15: 100 mL via INTRAVENOUS

## 2020-09-15 MED ORDER — ACETAMINOPHEN 10 MG/ML IV SOLN
INTRAVENOUS | Status: AC
  Filled 2020-09-15: qty 100

## 2020-09-15 MED ORDER — CHLORHEXIDINE GLUCONATE 0.12 % MT SOLN: 15.0000 mL | Freq: Once | OROMUCOSAL | Status: AC

## 2020-09-15 MED ORDER — FENTANYL CITRATE (PF) 100 MCG/2ML IJ SOLN
INTRAMUSCULAR | Status: DC | PRN
  Administered 2020-09-15: 50 ug via INTRAVENOUS
  Administered 2020-09-15 (×2): 25 ug via INTRAVENOUS
  Administered 2020-09-15 (×2): 50 ug via INTRAVENOUS

## 2020-09-15 MED ORDER — SEVOFLURANE IN SOLN
RESPIRATORY_TRACT | Status: AC
  Filled 2020-09-15: qty 250

## 2020-09-15 MED ORDER — ALPRAZOLAM 0.5 MG PO TABS: 0.5000 mg | ORAL_TABLET | Freq: Two times a day (BID) | ORAL | Status: DC | PRN

## 2020-09-15 MED ORDER — ONDANSETRON HCL 4 MG/2ML IJ SOLN
INTRAMUSCULAR | Status: DC | PRN
  Administered 2020-09-15: 4 mg via INTRAVENOUS

## 2020-09-15 MED ORDER — FENTANYL CITRATE (PF) 100 MCG/2ML IJ SOLN: 25.0000 ug | INTRAMUSCULAR | Status: DC | PRN

## 2020-09-15 MED ORDER — HYDROMORPHONE HCL 1 MG/ML IJ SOLN
0.5000 mg | INTRAMUSCULAR | Status: DC | PRN
  Filled 2020-09-15: qty 1

## 2020-09-15 MED ORDER — GLYCOPYRROLATE 0.2 MG/ML IJ SOLN
INTRAMUSCULAR | Status: AC
  Filled 2020-09-15: qty 1

## 2020-09-15 MED ORDER — BUPIVACAINE HCL (PF) 0.5 % IJ SOLN
INTRAMUSCULAR | Status: AC
  Filled 2020-09-15: qty 30

## 2020-09-15 SURGICAL SUPPLY — 46 items
BIT DRILL 4.3MMS DISTAL GRDTED (BIT) IMPLANT
BNDG COHESIVE 4X5 TAN STRL (GAUZE/BANDAGES/DRESSINGS) ×2 IMPLANT
BNDG COHESIVE 6X5 TAN STRL LF (GAUZE/BANDAGES/DRESSINGS) ×2 IMPLANT
CANISTER SUCT 1200ML W/VALVE (MISCELLANEOUS) ×2 IMPLANT
CHLORAPREP W/TINT 26 (MISCELLANEOUS) ×4 IMPLANT
CORTICAL BONE SCR 5.0MM X 48MM (Screw) ×2 IMPLANT
COVER WAND RF STERILE (DRAPES) ×2 IMPLANT
DRAPE 3/4 80X56 (DRAPES) ×2 IMPLANT
DRAPE C-ARMOR (DRAPES) ×2 IMPLANT
DRILL 4.3MMS DISTAL GRADUATED (BIT) ×2
DRSG OPSITE POSTOP 3X4 (GAUZE/BANDAGES/DRESSINGS) ×1 IMPLANT
DRSG OPSITE POSTOP 4X6 (GAUZE/BANDAGES/DRESSINGS) IMPLANT
ELECT CAUTERY BLADE 6.4 (BLADE) ×2 IMPLANT
ELECT REM PT RETURN 9FT ADLT (ELECTROSURGICAL) ×2
ELECTRODE REM PT RTRN 9FT ADLT (ELECTROSURGICAL) ×1 IMPLANT
GAUZE SPONGE 4X4 12PLY STRL (GAUZE/BANDAGES/DRESSINGS) ×2 IMPLANT
GAUZE XEROFORM 1X8 LF (GAUZE/BANDAGES/DRESSINGS) ×1 IMPLANT
GLOVE BIO SURGEON STRL SZ8 (GLOVE) ×4 IMPLANT
GLOVE INDICATOR 8.0 STRL GRN (GLOVE) ×2 IMPLANT
GOWN STRL REUS W/ TWL LRG LVL3 (GOWN DISPOSABLE) ×1 IMPLANT
GOWN STRL REUS W/ TWL XL LVL3 (GOWN DISPOSABLE) ×1 IMPLANT
GOWN STRL REUS W/TWL LRG LVL3 (GOWN DISPOSABLE) ×1
GOWN STRL REUS W/TWL XL LVL3 (GOWN DISPOSABLE) ×1
GUIDEPIN VERSANAIL DSP 3.2X444 (ORTHOPEDIC DISPOSABLE SUPPLIES) ×1 IMPLANT
GUIDEWIRE BALL NOSE 100CM (WIRE) ×1 IMPLANT
HFN LAG SCREW 10.5MM X 85MM (Screw) ×1 IMPLANT
HFN RH 130 DEG 11MM X 360MM (Orthopedic Implant) ×1 IMPLANT
MANIFOLD NEPTUNE II (INSTRUMENTS) ×2 IMPLANT
MAT ABSORB  FLUID 56X50 GRAY (MISCELLANEOUS) ×1
MAT ABSORB FLUID 56X50 GRAY (MISCELLANEOUS) ×1 IMPLANT
NDL FILTER BLUNT 18X1 1/2 (NEEDLE) ×1 IMPLANT
NEEDLE FILTER BLUNT 18X 1/2SAF (NEEDLE) ×1
NEEDLE FILTER BLUNT 18X1 1/2 (NEEDLE) ×1 IMPLANT
NEEDLE HYPO 22GX1.5 SAFETY (NEEDLE) ×2 IMPLANT
NS IRRIG 500ML POUR BTL (IV SOLUTION) ×2 IMPLANT
PACK HIP COMPR (MISCELLANEOUS) ×2 IMPLANT
SCREW BONE CORTICAL 5.0X44 (Screw) ×1 IMPLANT
SCREW CORTICL BON 5.0MM X 48MM (Screw) IMPLANT
STAPLER SKIN PROX 35W (STAPLE) ×2 IMPLANT
STRAP SAFETY 5IN WIDE (MISCELLANEOUS) ×2 IMPLANT
SUT VIC AB 0 CT1 36 (SUTURE) ×2 IMPLANT
SUT VIC AB 1 CT1 36 (SUTURE) ×2 IMPLANT
SUT VIC AB 2-0 CT1 (SUTURE) ×4 IMPLANT
SYR 10ML LL (SYRINGE) ×2 IMPLANT
SYR 30ML LL (SYRINGE) ×2 IMPLANT
TAPE MICROFOAM 4IN (TAPE) ×2 IMPLANT

## 2020-09-15 NOTE — Op Note (Signed)
09/15/2020  4:46 PM  Patient:   Gina Sanchez  Pre-Op Diagnosis:   Closed displaced pathologic fracture, right femoral shaft.  Post-Op Diagnosis:   Same  Procedure:   Reduction and internal fixation of closed displaced pathologic right femoral shaft fracture with Biomet Affixis TFN nail.  Surgeon:   Pascal Lux, MD  Assistant:   None  Anesthesia:   GET  Findings:   As above  Complications:   None  EBL:   100 cc  Fluids:   800 cc crystalloid  UOP:   None  TT:   None  Drains:   None  Closure:   Staples  Implants:   Biomet Affixis 11 x 360 mm TFN with an 85 mm lag screw and 44 and 48 mm distal interlocking screws  Brief Clinical Note:   The patient is a 55 year old female who sustained an apparent nontraumatic injury to her right thigh yesterday afternoon while trying to get up from bed with her husband's help. The patient had been dealing with a 1 month history of right thigh pain. Initial x-rays appeared to be unremarkable, so the pain was felt to be coming from her back. After her injury yesterday, she was brought to the emergency room where x-rays demonstrated an apparent pathologic fracture of her right femoral shaft. The patient has been cleared medically and presents at this time for reduction and internal fixation of the pathologic right femoral shaft fracture.  Procedure:   The patient was brought into the operating room and lain in the supine position. After adequate general endotracheal intubation and anesthesia were obtained, the patient was transferred to the fracture table. The uninjured leg was placed in a flexed and abducted position while the injured lower extremity was placed in longitudinal traction. The fracture was reduced using longitudinal traction and internal rotation. The adequacy of reduction was verified fluoroscopically in AP and lateral projections and found to be near anatomic. The lateral aspects of the right hip and thigh were prepped with  ChloraPrep solution before being draped sterilely. Preoperative antibiotics were administered. A timeout was performed to verify the appropriate surgical site.   The greater trochanter was identified fluoroscopically and an approximately 5-6 cm incision made about 2-3 fingerbreadths above the tip of the greater trochanter. The incision was carried down through the subcutaneous tissues to expose the gluteal fascia. This was split the length of the incision, providing access to the tip of the trochanter. Under fluoroscopic guidance, a guidewire was drilled through the tip of the trochanter into the proximal metaphysis to the level of the lesser trochanter. After verifying its position fluoroscopically in AP and lateral projections, it was overreamed with the initial reamer to the depth of the lesser trochanter. A guidewire was passed down through the femoral canal to the supracondylar region. The adequacy of guidewire position was verified fluoroscopically in AP and lateral projections before the length of the guidewire within the canal was measured and found to be 390 mm. Therefore, a 360 mm length nail was selected. The guidewire was overreamed sequentially using the flexible reamers, beginning with a 9 mm reamer and progressing to a 13 mm reamer. This provided good cortical chatter. All of the reamings were collected and sent to pathology to assist with identification of the unknown primary lesion. The 11 x 360 mm Biomet Affixis TFN rod was selected and advanced to the appropriate depth, as verified fluoroscopically.   The guide system for the lag screw was positioned and advanced through  an approximately 2 cm stab incision over the lateral aspect of the proximal femur. The guidewire was drilled up through the trochanteric femoral nail and into the femoral neck to rest within 5 mm of subchondral bone. After verifying its position in the femoral neck and head in both AP and lateral projections, the guidewire was  measured and found to be optimally replicated by an 85 mm lag screw. The guidewire was overreamed to the appropriate depth before the lag screw was inserted and advanced to the appropriate depth as verified fluoroscopically in AP and lateral projections. The locking screw was advanced, then backed off a quarter turn to set the lag screw. Again the adequacy of hardware position and fracture reduction was verified fluoroscopically in AP and lateral projections and found to be excellent.  Attention was directed distally. Using the "perfect circle" technique, the leg and fluoroscopy machine were positioned appropriately. An approximately 2.5-3 cm incision was made over the skin at the appropriate point before the drill bit was advanced through the cortex and across the static hole of the nail. The appropriate length of the screw was determined before a 44 mm distal interlocking screw was positioned, then advanced and tightened securely.  The adequacy of screw position was verified fluoroscopically in AP and lateral projections and found to be excellent.  Because this was a pathologic femoral shaft fracture, it was felt that a second distal interlocking screw was appropriate. The drill bit was positioned appropriately then advanced across the midportion of the dynamic hole of the nail. The appropriate screw length was determined before a 48 mm distal interlocking screw was positioned, then advanced and tightened securely. Again the adequacy of screw position was verified fluoroscopically in AP and lateral projections and found to be excellent.  The wounds were irrigated thoroughly with sterile saline solution before the abductor fascia was reapproximated using #1 Vicryl interrupted sutures. The subcutaneous tissues were closed using 2-0 Vicryl interrupted sutures. The skin was closed using staples. A total of 30 cc of 0.5% Sensorcaine with epinephrine was injected in and around all incisions. Sterile occlusive  dressings were applied to all wounds before the patient was awakened, extubated, and returned to the recovery room in satisfactory condition after tolerating the procedure well.

## 2020-09-15 NOTE — ED Notes (Signed)
Pt transported to CT at this time.

## 2020-09-15 NOTE — Anesthesia Postprocedure Evaluation (Signed)
Anesthesia Post Note  Patient: Gina Sanchez  Procedure(s) Performed: INTRAMEDULLARY (IM) NAIL INTERTROCHANTRIC (Right )  Patient location during evaluation: PACU Anesthesia Type: General Level of consciousness: awake and alert Pain management: pain level controlled Vital Signs Assessment: post-procedure vital signs reviewed and stable Respiratory status: spontaneous breathing and respiratory function stable Cardiovascular status: stable Anesthetic complications: no   No complications documented.   Last Vitals:  Vitals:   09/15/20 1700 09/15/20 1714  BP: (!) 161/86 (!) 137/91  Pulse: 95 98  Resp: 15 14  Temp:    SpO2: 96% 93%    Last Pain:  Vitals:   09/15/20 1700  TempSrc:   PainSc: 0-No pain                 Jeremyah Jelley K

## 2020-09-15 NOTE — ED Notes (Signed)
Dr. Roland Rack, MD with Orthopedics at bedside to discuss surgery.

## 2020-09-15 NOTE — Consult Note (Signed)
ORTHOPAEDIC CONSULTATION  REQUESTING PHYSICIAN: Sidney Ace, MD  Chief Complaint:   Right thigh pain.  History of Present Illness: Gina Sanchez is a 55 y.o. female with a history of anxiety/depression, hypertension, and palpitations who has experienced a 1+ month history of gradually worsening right thigh pain.  Her symptoms have progressed despite medications, activity modification, and even a recent lumbar epidural injection for presumed sciatica.  The patient apparently was being assisted from her bed yesterday afternoon by her husband when she felt a sudden pop in the right thigh.  She was unable to bear any weight.  She was brought to the emergency room where x-rays demonstrated what appears to be a pathologic right proximal femoral shaft fracture.  The patient has been admitted at this time for further work-up and definitive management of this injury.  The patient denies any associated injuries.  She did not strike her head or lose consciousness.  She also denies any lightheadedness, dizziness, chest pain, shortness of breath, or other symptoms which may have precipitated this incident.  The patient also denies any systemic symptoms such as night sweats, fevers, blood in her stool or urine, etc.  Past Medical History:  Diagnosis Date  . Abnormal Pap smear of cervix   . Anxiety   . Depression   . Essential hypertension   . Palpitations   . Precordial chest pain    Past Surgical History:  Procedure Laterality Date  . BREAST BIOPSY Right 2017   benign  . CERVICAL BIOPSY  W/ LOOP ELECTRODE EXCISION    . COLONOSCOPY WITH PROPOFOL N/A 07/03/2015   Procedure: COLONOSCOPY WITH PROPOFOL;  Surgeon: Hulen Luster, MD;  Location: [facility] ENDOSCOPY;  Service: Gastroenterology;  Laterality: N/A;  . ESOPHAGOGASTRODUODENOSCOPY (EGD) WITH PROPOFOL N/A 07/03/2015   Procedure: ESOPHAGOGASTRODUODENOSCOPY (EGD) WITH PROPOFOL;  Surgeon:  Hulen Luster, MD;  Location: [facility] ENDOSCOPY;  Service: Gastroenterology;  Laterality: N/A;  . LEFT HEART CATH AND CORONARY ANGIOGRAPHY N/A 03/28/2017   Procedure: Left Heart Cath and Coronary Angiography;  Surgeon: Lorretta Harp, MD;  Location: [facility];  Service: Cardiovascular;  Laterality: N/A;   Social History   Socioeconomic History  . Marital status: Married    Spouse name: Not on file  . Number of children: Not on file  . Years of education: Not on file  . Highest education level: Not on file  Occupational History  . Not on file  Tobacco Use  . Smoking status: Current Every Day Smoker    Packs/day: 1.00    Years: 30.00    Pack years: 30.00  . Smokeless tobacco: Never Used  Vaping Use  . Vaping Use: Never used  Substance and Sexual Activity  . Alcohol use: Not Currently    Alcohol/week: 0.0 standard drinks    Comment: 1-2 drinks/week.  . Drug use: No  . Sexual activity: Yes    Birth control/protection: I.U.D.  Other Topics Concern  . Not on file  Social History Narrative   Lives in [place] with her husband.  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   Social Determinants of Health   Financial Resource Strain: Not on file  Food Insecurity: Not on file  Transportation Needs: Not on file  Physical Activity: Not on file  Stress: Not on file  Social Connections: Not on file   Family History  Problem Relation Age of Onset  . Diabetes Mother  alive @ 47  . Heart disease Father        died of MI @ 34  . Osteoporosis Maternal Grandmother   . Colon cancer Maternal Grandfather   . Other Other        3 older brothers, alive and well.  . Breast cancer Neg Hx   . Ovarian cancer Neg Hx    Allergies  Allergen Reactions  . Factive [Gemifloxacin] Rash   Prior to Admission medications   Medication Sig Start Date End Date Taking? Authorizing Provider  ALPRAZolam Duanne Moron) 0.5 MG tablet Take 0.5 mg by mouth 2  (two) times daily as needed for anxiety.  11/29/13   [provider]  ezetimibe (ZETIA) 10 MG tablet Take 10 mg by mouth daily.    [provider]  levonorgestrel (MIRENA, 52 MG,) 20 MCG/24HR IUD 1 Intra Uterine Device (1 each total) by Intrauterine route once. 04/24/15   Rubie Maid, MD  nebivolol (BYSTOLIC) 10 MG tablet Take 10 mg by mouth daily.    [provider]   DG Pelvis 1-2 Views  Result Date: 09/14/2020 CLINICAL DATA:  Thigh and leg pain EXAM: PELVIS - 1-2 VIEW COMPARISON:  None. FINDINGS: There is no evidence of pelvic fracture or diastasis. The sacroiliac joints are intact. The visualized deep pelvis is unremarkable. IMPRESSION: Negative. Electronically Signed   By: Prudencio Pair M.D.   On: 09/14/2020 19:54   MR FEMUR RIGHT W WO CONTRAST  Result Date: 09/15/2020 CLINICAL DATA:  Right leg and thigh pain EXAM: MRI OF THE RIGHT FEMUR WITHOUT AND WITH CONTRAST TECHNIQUE: Multiplanar, multisequence MR imaging of the right was performed both before and after administration of intravenous contrast. CONTRAST:  7 mL Gadavist COMPARISON:  None. FINDINGS: Bones/Joint/Cartilage There is a comminuted impacted laterally angulated fracture of the proximal femoral shaft. There is an underlying heterogeneously enhancing intramedullary T1 dark/T2 bright osseous lesion within this region. There does appear to be possible heterogeneous hematoma seen along the proximal anteromedial aspect of the femoral shaft. No other acute osseous abnormality is seen. A small hip joint effusion is seen. Ligaments Suboptimally visualized Muscles and Tendons There is also surrounding non loculated fluid and muscular edema seen. The edema extends around the anterolateral aspect of the femur musculature and within the adductor musculature. The visualized portion of the tendons appear to be grossly intact. Soft tissues Diffuse subcutaneous and soft tissue edema seen surrounding the antro lateral aspect of the  proximal femur. IMPRESSION: Comminuted impacted laterally angulated proximal femoral shaft fracture. There does appear to be a heterogeneously enhancing osseous lesion within this area which is non-specific could be osseous neoplasm or metastatic lesion. Heterogeneously enhancing hematoma , seen along the medial aspect of the proximal femoral shaft. Diffuse muscular edema seen surrounding the proximal femur. Electronically Signed   By: Prudencio Pair M.D.   On: 09/15/2020 02:23   DG FEMUR, MIN 2 VIEWS RIGHT  Result Date: 09/14/2020 CLINICAL DATA:  Pain for 1 month EXAM: RIGHT FEMUR 2 VIEWS COMPARISON:  None. FINDINGS: There is a comminuted mildly angulated proximal right femoral shaft fracture. There is question of underlying mottled lesion seen within the midshaft of the femur. The femoral head is still well seated within the acetabulum. IMPRESSION: Comminuted mildly angulated proximal right femur fracture. There is question of an underlying mottled lesion. Would recommend nonemergent MRI with contrast for further evaluation. Electronically Signed   By: Prudencio Pair M.D.   On: 09/14/2020 19:53    Positive ROS: All other  systems have been reviewed and were otherwise negative with the exception of those mentioned in the HPI and as above.  Physical Exam: General:  Alert, no acute distress Psychiatric:  Patient is competent for consent with normal mood and affect   Cardiovascular:  No pedal edema Respiratory:  No wheezing, non-labored breathing GI:  Abdomen is soft and non-tender Skin:  No lesions in the area of chief complaint Neurologic:  Sensation intact distally Lymphatic:  No axillary or cervical lymphadenopathy  Orthopedic Exam:  Orthopedic examination is limited to the right hip and lower extremity.  There is an obvious deformity of the right thigh with significant swelling.  The right lower extremity somewhat shortened and externally rotated as compared to the left.  No erythema, ecchymosis,  abrasions, or other skin abnormalities are identified.  She has severe pain with any active or passive motion of the thigh.  She also has moderate tenderness to palpation over the thigh region.  She is neurovascularly intact to her right foot.  X-rays:  Recent x-rays of the right femur are available for review and have been reviewed by myself.  These films demonstrate a short transverse fracture through the subtrochanteric region which appears to be pathologic in origin.    A MRI scan of the right thigh also is available for review and has been reviewed by myself.  These findings are as described above.  Assessment: Apparent pathologic fracture of right proximal femoral shaft/subtrochanteric region.  Plan: The treatment options have been discussed in detail with the patient, including both surgical and nonsurgical choices.  The patient would like to proceed with surgical intervention to include an intramedullary nailing of the right femur fracture.  This procedure has been discussed in detail with the patient, as have the potential risks (including bleeding, infection, nerve and/or blood vessel injury, persistent or recurrent pain, malunion and/or nonunion, leg length inequality, need for further surgery, blood clots, strokes, heart attacks and/or arrhythmias, etc.) and benefits.  The patient states her understanding wishes to proceed.  A formal written consent will be obtained by the nursing staff.  Thank you for asking me to participate in the care of this most pleasant yet unfortunate woman.  We will be happy to follow her with you.   Pascal Lux, MD  Beeper #:  [phone]  09/15/2020 8:06 AM

## 2020-09-15 NOTE — Anesthesia Preprocedure Evaluation (Signed)
Anesthesia Evaluation  Patient identified by MRN, date of birth, ID band Patient awake    Reviewed: Allergy & Precautions, H&P , NPO status , Patient's Chart, lab work & pertinent test results  History of Anesthesia Complications Negative for: history of anesthetic complications  Airway Mallampati: III  TM Distance: >3 FB Neck ROM: full    Dental  (+) Chipped   Pulmonary neg shortness of breath, Current Smoker and Patient abstained from smoking.,    Pulmonary exam normal        Cardiovascular Exercise Tolerance: Good hypertension, (-) angina(-) Past MI Normal cardiovascular exam     Neuro/Psych PSYCHIATRIC DISORDERS negative neurological ROS     GI/Hepatic negative GI ROS, Neg liver ROS, neg GERD  ,  Endo/Other  negative endocrine ROS  Renal/GU      Musculoskeletal   Abdominal   Peds  Hematology negative hematology ROS (+)   Anesthesia Other Findings Past Medical History: No date: Abnormal Pap smear of cervix No date: Anxiety No date: Depression No date: Essential hypertension No date: Palpitations No date: Precordial chest pain  Past Surgical History: 2017: BREAST BIOPSY; Right     Comment:  benign No date: CERVICAL BIOPSY  W/ LOOP ELECTRODE EXCISION 07/03/2015: COLONOSCOPY WITH PROPOFOL; N/A     Comment:  Procedure: COLONOSCOPY WITH PROPOFOL;  Surgeon: Hulen Luster, MD;  Location: [facility] ENDOSCOPY;  Service:               Gastroenterology;  Laterality: N/A; 07/03/2015: ESOPHAGOGASTRODUODENOSCOPY (EGD) WITH PROPOFOL; N/A     Comment:  Procedure: ESOPHAGOGASTRODUODENOSCOPY (EGD) WITH               PROPOFOL;  Surgeon: Hulen Luster, MD;  Location: [facility]               ENDOSCOPY;  Service: Gastroenterology;  Laterality: N/A; 03/28/2017: LEFT HEART CATH AND CORONARY ANGIOGRAPHY; N/A     Comment:  Procedure: Left Heart Cath and Coronary Angiography;                Surgeon: Lorretta Harp, MD;   Location: [place] [facility];  Service: Cardiovascular;  Laterality: N/A;  BMI    Body Mass Index: 29.91 kg/m      Reproductive/Obstetrics negative OB ROS                            Anesthesia Physical Anesthesia Plan  ASA: II  Anesthesia Plan: General ETT   Post-op Pain Management:    Induction: Intravenous  PONV Risk Score and Plan: Ondansetron, Dexamethasone, Midazolam and Treatment may vary due to age or medical condition  Airway Management Planned: Oral ETT  Additional Equipment:   Intra-op Plan:   Post-operative Plan: Extubation in OR  Informed Consent: I have reviewed the patients History and Physical, chart, labs and discussed the procedure including the risks, benefits and alternatives for the proposed anesthesia with the patient or authorized representative who has indicated his/her understanding and acceptance.     Dental Advisory Given  Plan Discussed with: Anesthesiologist, CRNA and Surgeon  Anesthesia Plan Comments: (Patient reports new evolving lumbar symptoms over the past couple of weeks that radiate into her right leg so plan for GA  Patient consented for risks of anesthesia including but not limited to:  -  adverse reactions to medications - damage to eyes, teeth, lips or other oral mucosa - nerve damage due to positioning  - sore throat or hoarseness - Damage to heart, brain, nerves, lungs, other parts of body or loss of life  Patient voiced understanding.)       Anesthesia Quick Evaluation

## 2020-09-15 NOTE — Anesthesia Procedure Notes (Signed)
Procedure Name: Intubation Date/Time: 09/15/2020 2:56 PM Performed by: Lily Peer, Syleena Mchan, CRNA Pre-anesthesia Checklist: Patient identified, Emergency Drugs available, Suction available and Patient being monitored Patient Re-evaluated:Patient Re-evaluated prior to induction Oxygen Delivery Method: Circle system utilized Preoxygenation: Pre-oxygenation with 100% oxygen Induction Type: IV induction Ventilation: Mask ventilation without difficulty Laryngoscope Size: McGraph and 3 Grade View: Grade I Tube type: Oral Tube size: 7.0 mm Number of attempts: 1 Airway Equipment and Method: Stylet Placement Confirmation: ETT inserted through vocal cords under direct vision,  positive ETCO2 and breath sounds checked- equal and bilateral Secured at: 21 cm Tube secured with: Tape Dental Injury: Teeth and Oropharynx as per pre-operative assessment

## 2020-09-15 NOTE — H&P (Addendum)
PROGRESS NOTE  Inadvertently mislabeled as H&P.  This is a progress note for date 09/15/2020   Gina Sanchez  NOM:[id number] DOB: [birth year] DOA: 09/14/2020 PCP: Remi Haggard, FNP    Brief Narrative:  55 y.o. female with medical history significant for hypertension, hyperlipidemia, generalized anxiety disorder who is admitted to [facility] on 09/14/2020 with an acute right proximal femoral shaft fracture after presenting from home to [facility] Emergency Department complaining of right thigh pain.   The patient reports approximately 1 month of right thigh/right hip pain, over which time she reports that she underwent outpatient plain films of the right hip and right femur demonstrating no evidence of acute fracture.  Over that time, she reports progressive, sharp pain involving the right thigh as well as the right hip, which worsens with ambulation.  Denies any preceding trauma or fall, and confirms that the right hip represents an native joint for her.  Earlier today, she reports rolling over in bed, at which time she heard an audible "popping" sound from her right thigh/right hip, immediately following which she developed pain of greater severity relative to that that she has been experiencing over the course of the last month  Plan for operative fixation at [facility] 09/15/2020.  Case discussed with orthopedic surgery.  Concern for pathologic fracture as noted on MRI.  CT chest abdomen pelvis with contrast ordered for metastatic evaluation.   Assessment & Plan:   Principal Problem:   Right femoral shaft fracture (HCC) Active Problems:   Essential hypertension   Hypercholesteremia   Right hip pain   Leukocytosis   GAD (generalized anxiety disorder)  Femoral shaft fracture, right, POA 1 month of progressive right hip pain Significant increase after nontraumatic popping identified by patient on the evening of admission No fall or trauma Radiographs commuted mildly  angulated proximal right femoral shaft fracture MRI redemonstrates area of lucency concerning for possible osseous or metastatic lesion Neurovascularly intact Plan: Operative fixation with orthopedics Intraoperative pathology to be sent CT chest abdomen pelvis to evaluate for metastatic disease  Hypertension No beta-blocker on hold  Hyperlipidemia On Zetia on hold  Anxiety As needed Xanax     DVT prophylaxis: SCDs, start chemoprophylaxis on postop day 1 Code Status: Full Family Communication: None today  disposition Plan: Status is: Inpatient  Remains inpatient appropriate because:Inpatient level of care appropriate due to severity of illness   Dispo: The patient is from: Home              Anticipated d/c is to: Home              Anticipated d/c date is: 3 days              Patient currently is not medically stable to d/c.  Operative fixation of right femoral shaft fracture today        Consultants:   Orthopedics  Procedures:   Hip fracture repair, 09/15/2020  Antimicrobials:   Perioperative Ancef   Subjective: Patient seen and examined in ER.  Pain controlled.  N.p.o. for surgery  Objective: Vitals:   09/15/20 0600 09/15/20 0900 09/15/20 1130 09/15/20 1259  BP: (!) 153/98 140/88 (!) 162/94 (!) 158/89  Pulse: 87 (!) 101 (!) 106 (!) 106  Resp: 20 20 20 14   Temp:    97.9 F (36.6 C)  TempSrc:    Oral  SpO2: 93% 94% 96% 98%  Weight:      Height:  Intake/Output Summary (Last 24 hours) at 09/15/2020 1529 Last data filed at 09/15/2020 1458 Gross per 24 hour  Intake 100 ml  Output 150 ml  Net -50 ml   Filed Weights   09/14/20 1820  Weight: 86.6 kg    Examination:  General exam: No acute distress Respiratory system: Clear to auscultation. Respiratory effort normal. Cardiovascular system: S1 & S2 heard, RRR. No JVD, murmurs, rubs, gallops or clicks. No pedal edema. Gastrointestinal system: Abdomen is nondistended, soft and nontender. No  organomegaly or masses felt. Normal bowel sounds heard. Central nervous system: Alert and oriented. No focal neurological deficits. Extremities: Symmetric 5 x 5 power.  Right lower extremity shortened and externally rotated Skin: No rashes, lesions or ulcers Psychiatry: Judgement and insight appear normal. Mood & affect appropriate.     Data Reviewed: I have personally reviewed following labs and imaging studies  CBC: Recent Labs  Lab 09/14/20 1914 09/15/20 0322  WBC 15.4* 15.6*  NEUTROABS 12.0* 11.3*  HGB 13.7 13.2  HCT 40.6 39.2  MCV 84.9 85.2  PLT 337 226   Basic Metabolic Panel: Recent Labs  Lab 09/14/20 1914 09/15/20 0322  NA 137 137  K 3.7 3.5  CL 103 104  CO2 24 24  GLUCOSE 118* 105*  BUN 15 17  CREATININE 0.63 0.63  CALCIUM 9.1 8.7*  MG  --  2.1   GFR: Estimated Creatinine Clearance: 91.9 mL/min (by C-G formula based on SCr of 0.63 mg/dL). Liver Function Tests: Recent Labs  Lab 09/14/20 1914  AST 16  ALT 16  ALKPHOS 84  BILITOT 1.0  PROT 7.0  ALBUMIN 3.6   No results for input(s): LIPASE, AMYLASE in the last 168 hours. No results for input(s): AMMONIA in the last 168 hours. Coagulation Profile: Recent Labs  Lab 09/14/20 1914  INR 1.1   Cardiac Enzymes: No results for input(s): CKTOTAL, CKMB, CKMBINDEX, TROPONINI in the last 168 hours. BNP (last 3 results) No results for input(s): PROBNP in the last 8760 hours. HbA1C: No results for input(s): HGBA1C in the last 72 hours. CBG: No results for input(s): GLUCAP in the last 168 hours. Lipid Profile: No results for input(s): CHOL, HDL, LDLCALC, TRIG, CHOLHDL, LDLDIRECT in the last 72 hours. Thyroid Function Tests: No results for input(s): TSH, T4TOTAL, FREET4, T3FREE, THYROIDAB in the last 72 hours. Anemia Panel: No results for input(s): VITAMINB12, FOLATE, FERRITIN, TIBC, IRON, RETICCTPCT in the last 72 hours. Sepsis Labs: No results for input(s): PROCALCITON, LATICACIDVEN in the last 168  hours.  Recent Results (from the past 240 hour(s))  Resp Panel by RT-PCR (Flu A&B, Covid) Nasopharyngeal Swab     Status: None   Collection Time: 09/14/20  7:20 PM   Specimen: Nasopharyngeal Swab; Nasopharyngeal(NP) swabs in vial transport medium  Result Value Ref Range Status   SARS Coronavirus 2 by RT PCR NEGATIVE NEGATIVE Final    Comment: (NOTE) SARS-CoV-2 target nucleic acids are NOT DETECTED.  The SARS-CoV-2 RNA is generally detectable in upper respiratory specimens during the acute phase of infection. The lowest concentration of SARS-CoV-2 viral copies this assay can detect is 138 copies/mL. A negative result does not preclude SARS-Cov-2 infection and should not be used as the sole basis for treatment or other patient management decisions. A negative result may occur with  improper specimen collection/handling, submission of specimen other than nasopharyngeal swab, presence of viral mutation(s) within the areas targeted by this assay, and inadequate number of viral copies(<138 copies/mL). A negative result must be combined with  clinical observations, patient history, and epidemiological information. The expected result is Negative.  Fact Sheet for Patients:  EntrepreneurPulse.com.au  Fact Sheet for Healthcare Providers:  IncredibleEmployment.be  This test is no t yet approved or cleared by the [place] FDA and  has been authorized for detection and/or diagnosis of SARS-CoV-2 by FDA under an Emergency Use Authorization (EUA). This EUA will remain  in effect (meaning this test can be used) for the duration of the COVID-19 declaration under Section 564(b)(1) of the Act, 21 U.S.C.section 360bbb-3(b)(1), unless the authorization is terminated  or revoked sooner.       Influenza A by PCR NEGATIVE NEGATIVE Final   Influenza B by PCR NEGATIVE NEGATIVE Final    Comment: (NOTE) The Xpert Xpress SARS-CoV-2/FLU/RSV plus assay is intended  as an aid in the diagnosis of influenza from Nasopharyngeal swab specimens and should not be used as a sole basis for treatment. Nasal washings and aspirates are unacceptable for Xpert Xpress SARS-CoV-2/FLU/RSV testing.  Fact Sheet for Patients: EntrepreneurPulse.com.au  Fact Sheet for Healthcare Providers: IncredibleEmployment.be  This test is not yet approved or cleared by the [place] FDA and has been authorized for detection and/or diagnosis of SARS-CoV-2 by FDA under an Emergency Use Authorization (EUA). This EUA will remain in effect (meaning this test can be used) for the duration of the COVID-19 declaration under Section 564(b)(1) of the Act, 21 U.S.C. section 360bbb-3(b)(1), unless the authorization is terminated or revoked.  Performed at [facility], [address] [place], [place] [address]          Radiology Studies: DG Pelvis 1-2 Views  Result Date: 09/14/2020 CLINICAL DATA:  Thigh and leg pain EXAM: PELVIS - 1-2 VIEW COMPARISON:  None. FINDINGS: There is no evidence of pelvic fracture or diastasis. The sacroiliac joints are intact. The visualized deep pelvis is unremarkable. IMPRESSION: Negative. Electronically Signed   By: Prudencio Pair M.D.   On: 09/14/2020 19:54   MR FEMUR RIGHT W WO CONTRAST  Result Date: 09/15/2020 CLINICAL DATA:  Right leg and thigh pain EXAM: MRI OF THE RIGHT FEMUR WITHOUT AND WITH CONTRAST TECHNIQUE: Multiplanar, multisequence MR imaging of the right was performed both before and after administration of intravenous contrast. CONTRAST:  7 mL Gadavist COMPARISON:  None. FINDINGS: Bones/Joint/Cartilage There is a comminuted impacted laterally angulated fracture of the proximal femoral shaft. There is an underlying heterogeneously enhancing intramedullary T1 dark/T2 bright osseous lesion within this region. There does appear to be possible heterogeneous hematoma seen along the proximal anteromedial  aspect of the femoral shaft. No other acute osseous abnormality is seen. A small hip joint effusion is seen. Ligaments Suboptimally visualized Muscles and Tendons There is also surrounding non loculated fluid and muscular edema seen. The edema extends around the anterolateral aspect of the femur musculature and within the adductor musculature. The visualized portion of the tendons appear to be grossly intact. Soft tissues Diffuse subcutaneous and soft tissue edema seen surrounding the antro lateral aspect of the proximal femur. IMPRESSION: Comminuted impacted laterally angulated proximal femoral shaft fracture. There does appear to be a heterogeneously enhancing osseous lesion within this area which is non-specific could be osseous neoplasm or metastatic lesion. Heterogeneously enhancing hematoma , seen along the medial aspect of the proximal femoral shaft. Diffuse muscular edema seen surrounding the proximal femur. Electronically Signed   By: Prudencio Pair M.D.   On: 09/15/2020 02:23   CT CHEST ABDOMEN PELVIS W CONTRAST  Result Date: 09/15/2020 CLINICAL DATA:  Possible pathologic  femoral fracture, metastatic evaluation EXAM: CT CHEST, ABDOMEN, AND PELVIS WITH CONTRAST TECHNIQUE: Multidetector CT imaging of the chest, abdomen and pelvis was performed following the standard protocol during bolus administration of intravenous contrast. CONTRAST:  188m OMNIPAQUE IOHEXOL 300 MG/ML  SOLN COMPARISON:  None. FINDINGS: CT CHEST FINDINGS Cardiovascular: Normal heart size. No pericardial effusion. Thoracic aorta is normal in caliber. Mediastinum/Nodes: Mediastinal adenopathy is present. For example, enlarged pretracheal node measuring 2.2 x 1.5 cm; right paratracheal node measuring 2.7 x 1.3 cm. Prevascular node measuring 0.3 x 0.9 cm. There is a 1 cm nodule of the partially imaged right thyroid lobe. Esophagus is unremarkable. No hilar or axillary adenopathy. Lungs/Pleura: There is a 5 x 4 mm nodule the right middle lobe  (series 4, image 83). Dependent atelectasis/scarring. No pleural effusion. Musculoskeletal: No chest wall mass or suspicious bone lesion. CT ABDOMEN PELVIS FINDINGS Hepatobiliary: Stable small cyst at the dome of the liver. Two areas of subcapsular ill-defined low-attenuation in the left hepatic lobe measuring up to 1.8 cm (series 2, image 46). Gallbladder is unremarkable. No biliary dilatation. Pancreas: Unremarkable. Spleen: Unremarkable. Adrenals/Urinary Tract: Right adrenal is unremarkable. There is a 2.8 x 1.9 cm left adrenal lesion. Too small to characterize low-attenuation lesion of the upper pole of the right kidney. Small cyst of the lower pole of the left kidney. 3 mm nonobstructing calculus of the upper pole the right kidney. Ureters are unremarkable. There is mild layering hyperdensity in the bladder. Stomach/Bowel: Stomach is within normal limits. Bowel is normal in caliber. Distal colonic diverticulosis. Normal appendix. Vascular/Lymphatic: Aortic atherosclerosis. No enlarged lymph nodes. Reproductive: Uterus and bilateral adnexa are unremarkable. Other: No ascites.  Abdominal wall is unremarkable. Musculoskeletal: Comminuted and angulated proximal right femoral shaft fracture with adjacent hematoma as described previously. Transitional anatomy at the lumbosacral junction. IMPRESSION: Mediastinal adenopathy. 4.5 mm right middle lobe nodule. 1 cm right thyroid lobe nodule. Ultrasound is not recommended by current guidelines. (Ref: J Am Coll Radiol. 2015 Feb;12(2): 143-50). Indeterminate 2.8 cm left adrenal lesion. Two small areas of subcapsular left hepatic lobe low density, which could reflect focal fat or lesions. Layering mild hyperdensity within the bladder, which could reflect proteinaceous material or blood products. Electronically Signed   By: PMacy MisM.D.   On: 09/15/2020 09:42   DG FEMUR, MIN 2 VIEWS RIGHT  Result Date: 09/14/2020 CLINICAL DATA:  Pain for 1 month EXAM: RIGHT FEMUR 2  VIEWS COMPARISON:  None. FINDINGS: There is a comminuted mildly angulated proximal right femoral shaft fracture. There is question of underlying mottled lesion seen within the midshaft of the femur. The femoral head is still well seated within the acetabulum. IMPRESSION: Comminuted mildly angulated proximal right femur fracture. There is question of an underlying mottled lesion. Would recommend nonemergent MRI with contrast for further evaluation. Electronically Signed   By: BPrudencio PairM.D.   On: 09/14/2020 19:53        Scheduled Meds: . [MAR Hold] nebivolol  10 mg Oral Daily   Continuous Infusions: . sodium chloride 50 mL/hr at 09/15/20 1313  . [MAR Hold] methocarbamol (ROBAXIN) IV Stopped (09/15/20 0241)     LOS: 1 day    Time spent: 25 minutes    SSidney Ace MD [facility] Pager [phone]  If 7PM-7AM, please contact night-coverage 09/15/2020, 3:29 PM

## 2020-09-15 NOTE — Transfer of Care (Signed)
Immediate Anesthesia Transfer of Care Note  Patient: Gina Sanchez  Procedure(s) Performed: INTRAMEDULLARY (IM) NAIL INTERTROCHANTRIC (Right )  Patient Location: PACU  Anesthesia Type:General  Level of Consciousness: drowsy  Airway & Oxygen Therapy: Patient Spontanous Breathing and Patient connected to face mask oxygen  Post-op Assessment: Report given to RN and Post -op Vital signs reviewed and stable  Post vital signs: Reviewed and stable  Last Vitals:  Vitals Value Taken Time  BP 133/83 09/15/20 1645  Temp 36.2 C 09/15/20 1645  Pulse 90 09/15/20 1646  Resp 14 09/15/20 1646  SpO2 100 % 09/15/20 1646  Vitals shown include unvalidated device data.  Last Pain:  Vitals:   09/15/20 1259  TempSrc: Oral  PainSc: 10-Worst pain ever         Complications: No complications documented.

## 2020-09-16 ENCOUNTER — Encounter: Payer: Self-pay | Admitting: Surgery

## 2020-09-16 DIAGNOSIS — R590 Localized enlarged lymph nodes: Secondary | ICD-10-CM

## 2020-09-16 DIAGNOSIS — S72301K Unspecified fracture of shaft of right femur, subsequent encounter for closed fracture with nonunion: Secondary | ICD-10-CM

## 2020-09-16 LAB — CBC
HCT: 37.2 % (ref 36.0–46.0)
Hemoglobin: 12.3 g/dL (ref 12.0–15.0)
MCH: 28.6 pg (ref 26.0–34.0)
MCHC: 33.1 g/dL (ref 30.0–36.0)
MCV: 86.5 fL (ref 80.0–100.0)
Platelets: 289 10*3/uL (ref 150–400)
RBC: 4.3 MIL/uL (ref 3.87–5.11)
RDW: 13.7 % (ref 11.5–15.5)
WBC: 13.1 10*3/uL — ABNORMAL HIGH (ref 4.0–10.5)
nRBC: 0 % (ref 0.0–0.2)

## 2020-09-16 LAB — BASIC METABOLIC PANEL
Anion gap: 9 (ref 5–15)
BUN: 16 mg/dL (ref 6–20)
CO2: 23 mmol/L (ref 22–32)
Calcium: 8.6 mg/dL — ABNORMAL LOW (ref 8.9–10.3)
Chloride: 106 mmol/L (ref 98–111)
Creatinine, Ser: 0.61 mg/dL (ref 0.44–1.00)
GFR, Estimated: >60 mL/min (ref >60)
Glucose, Bld: 141 mg/dL — ABNORMAL HIGH (ref 70–99)
Potassium: 4.2 mmol/L (ref 3.5–5.1)
Sodium: 138 mmol/L (ref 135–145)

## 2020-09-16 MED ORDER — METOPROLOL SUCCINATE ER 25 MG PO TB24
25.0000 mg | ORAL_TABLET | Freq: Every day | ORAL | Status: DC
  Administered 2020-09-17: 25 mg via ORAL
  Filled 2020-09-16 (×2): qty 1

## 2020-09-16 MED ORDER — SENNOSIDES-DOCUSATE SODIUM 8.6-50 MG PO TABS
1.0000 | ORAL_TABLET | Freq: Two times a day (BID) | ORAL | Status: DC
  Administered 2020-09-16 – 2020-09-17 (×3): 1 via ORAL
  Filled 2020-09-16 (×3): qty 1

## 2020-09-16 MED ORDER — TRAMADOL HCL 50 MG PO TABS
50.0000 mg | ORAL_TABLET | Freq: Once | ORAL | Status: AC
  Administered 2020-09-16: 50 mg via ORAL

## 2020-09-16 NOTE — Evaluation (Signed)
Physical Therapy Evaluation Patient Details Name: Gina Sanchez MRN: [id number] DOB: 06-25-1966 Today's Date: 09/16/2020   History of Present Illness  55 y.o. female with medical history significant for hypertension, hyperlipidemia, generalized anxiety disorder who is admitted to [facility] on 09/14/2020 with an acute right proximal femoral shaft fracture.  Had been having thigh pain for a few weeks, now s/p nailing 09/15/20.  Clinical Impression  Pt eager to work with PT and see what she can do, but does endorse some anxiety t/o the entire session.  She was able to give good effort with exercises and ambulation bout but did have elevated HR much of the time and as high as the 150s during ambulation.      Follow Up Recommendations Follow surgeon's recommendation for DC plan and follow-up therapies    Equipment Recommendations  Rolling walker with 5" wheels    Recommendations for Other Services       Precautions / Restrictions Restrictions Weight Bearing Restrictions: Yes RLE Weight Bearing: Partial weight bearing RLE Partial Weight Bearing Percentage or Pounds: 50      Mobility  Bed Mobility Overal bed mobility: Modified Independent             General bed mobility comments: uses UEs to gently assist R LE to EOB    Transfers Overall transfer level: Modified independent Equipment used: Rolling walker (2 wheeled)             General transfer comment: heavy use of UEs, extra cuing but no direct assist to attain standing  Ambulation/Gait Ambulation/Gait assistance: Modified independent (Device/Increase time) Gait Distance (Feet): 70 Feet Assistive device: Rolling walker (2 wheeled)          Stairs            Wheelchair Mobility    Modified Rankin (Stroke Patients Only)       Balance Overall balance assessment: Modified Independent                                           Pertinent Vitals/Pain      Home  Living Family/patient expects to be discharged to:: Private residence Living Arrangements: Spouse/significant other   Type of Home: House Home Access: Stairs to enter Entrance Stairs-Rails: Can reach both Entrance Stairs-Number of Steps: 3 Home Layout: One level Home Equipment: Bedside commode      Prior Function Level of Independence: Independent         Comments: Pt able to do all she needs w/o assist     Hand Dominance        Extremity/Trunk Assessment   Upper Extremity Assessment Upper Extremity Assessment: Overall WFL for tasks assessed    Lower Extremity Assessment Lower Extremity Assessment:  (expected post-op weakness)       Communication   Communication: No difficulties  Cognition Arousal/Alertness: Awake/alert Behavior During Therapy: WFL for tasks assessed/performed;Anxious Overall Cognitive Status: Within Functional Limits for tasks assessed                                        General Comments      Exercises General Exercises - Lower Extremity Ankle Circles/Pumps: AROM;10 reps Quad Sets: Strengthening;10 reps Short Arc Quad: Strengthening;10 reps Heel Slides: AROM;5 reps (with resisted leg extensions) Hip ABduction/ADduction:  AROM;10 reps   Assessment/Plan    PT Assessment Patient needs continued PT services  PT Problem List Decreased strength;Decreased activity tolerance;Decreased balance;Decreased mobility;Decreased coordination;Decreased knowledge of use of DME;Decreased safety awareness       PT Treatment Interventions      PT Goals (Current goals can be found in the Care Plan section)  Acute Rehab PT Goals Patient Stated Goal: go home PT Goal Formulation: With patient Time For Goal Achievement: 09/30/20 Potential to Achieve Goals: Good    Frequency Min 2X/week   Barriers to discharge        Co-evaluation               AM-PAC PT "6 Clicks" Mobility  Outcome Measure Help needed turning from your  back to your side while in a flat bed without using bedrails?: A Little Help needed moving from lying on your back to sitting on the side of a flat bed without using bedrails?: A Little Help needed moving to and from a bed to a chair (including a wheelchair)?: A Little Help needed standing up from a chair using your arms (e.g., wheelchair or bedside chair)?: A Little Help needed to walk in hospital room?: A Little Help needed climbing 3-5 steps with a railing? : A Lot 6 Click Score: 17    End of Session Equipment Utilized During Treatment: Gait belt Activity Tolerance: Patient tolerated treatment well Patient left: with call bell/phone within reach   PT Visit Diagnosis: Muscle weakness (generalized) (M62.81);Difficulty in walking, not elsewhere classified (R26.2)    Time: 8979-1504 PT Time Calculation (min) (ACUTE ONLY): 61 min   Charges:   PT Evaluation $PT Eval Low Complexity: 1 Low PT Treatments $Gait Training: 8-22 mins $Therapeutic Exercise: 8-22 mins $Therapeutic Activity: 8-22 mins        Kreg Shropshire, DPT 09/16/2020, 1:34 PM

## 2020-09-16 NOTE — Consult Note (Signed)
Hematology/Oncology Consult note [facility] Telephone:([phone] [phone]  Patient Care Team: Remi Haggard, FNP as PCP - General (Family Medicine)   Name of the patient: Gina Sanchez  [id number]  25-Feb-1966    Reason for consult: Abnormal MRI femur   Requesting physician: Dr. Ralene Muskrat  Date of visit: 09/16/2020    History of presenting illness- Patient is a 55 year old female with a past medical history significant for hypertension hyperlipidemia and anxiety who presented with right thigh pain and was found to have an acute right proximal femoral shaft fracture.  She underwent operative fixation on 10/12/2020.  MRI of femur showed heterogeneously enhancing osseous lesion within the area which would be nonspecific versus office neoplasm or metastatic lesion.  CT chest abdomen and pelvis with contrast showed an enlarged pretracheal lymph node 2.2 x 1.5 cm and a right paratracheal lymph node measuring 2.7 x 1.3 cm.  Prevascular node measuring 0.3 x 0.9 cm.  5 x 4 mm right middle lobe nodule.  2.8 x 1.9 cm left adrenal lesion.  Oncology consulted for further management  Patient smokes about 1 pack of cigarettes every 3 days.  Reports that her appetite and weight have remained stable.  She does not have significant medical problems.  She has been up-to-date with her mammograms.  Pain is improving after surgery  ECOG PS- 1  Pain scale- 2   Review of systems- Review of Systems  Constitutional: Negative for chills, fever, malaise/fatigue and weight loss.  HENT: Negative for congestion, ear discharge and nosebleeds.   Eyes: Negative for blurred vision.  Respiratory: Negative for cough, hemoptysis, sputum production, shortness of breath and wheezing.   Cardiovascular: Negative for chest pain, palpitations, orthopnea and claudication.  Gastrointestinal: Negative for abdominal pain, blood in stool, constipation, diarrhea, heartburn, melena,  nausea and vomiting.  Genitourinary: Negative for dysuria, flank pain, frequency, hematuria and urgency.  Musculoskeletal: Negative for back pain, joint pain and myalgias.       Right leg pain  Skin: Negative for rash.  Neurological: Negative for dizziness, tingling, focal weakness, seizures, weakness and headaches.  Endo/Heme/Allergies: Does not bruise/bleed easily.  Psychiatric/Behavioral: Negative for depression and suicidal ideas. The patient does not have insomnia.     Allergies  Allergen Reactions  . Factive [Gemifloxacin] Rash    Patient Active Problem List   Diagnosis Date Noted  . Closed fracture of right femur, unspecified fracture morphology, initial encounter ([place]) 09/14/2020  . Right femoral shaft fracture ([place]) 09/14/2020  . Right hip pain 09/14/2020  . Leukocytosis 09/14/2020  . GAD (generalized anxiety disorder) 09/14/2020  . Tobacco use 03/29/2017  . Unstable angina ([place]) 03/25/2017  . Essential hypertension   . Hypercholesteremia   . Precordial chest pain   . Palpitations      Past Medical History:  Diagnosis Date  . Abnormal Pap smear of cervix   . Anxiety   . Depression   . Essential hypertension   . Palpitations   . Precordial chest pain      Past Surgical History:  Procedure Laterality Date  . BREAST BIOPSY Right 2017   benign  . CERVICAL BIOPSY  W/ LOOP ELECTRODE EXCISION    . COLONOSCOPY WITH PROPOFOL N/A 07/03/2015   Procedure: COLONOSCOPY WITH PROPOFOL;  Surgeon: Hulen Luster, MD;  Location: [facility] ENDOSCOPY;  Service: Gastroenterology;  Laterality: N/A;  . ESOPHAGOGASTRODUODENOSCOPY (EGD) WITH PROPOFOL N/A 07/03/2015   Procedure: ESOPHAGOGASTRODUODENOSCOPY (EGD) WITH PROPOFOL;  Surgeon: Hulen Luster, MD;  Location:  [facility] ENDOSCOPY;  Service: Gastroenterology;  Laterality: N/A;  . INTRAMEDULLARY (IM) NAIL INTERTROCHANTERIC Right 09/15/2020   Procedure: INTRAMEDULLARY (IM) NAIL INTERTROCHANTRIC;  Surgeon: Corky Mull, MD;  Location: [facility] ORS;   Service: Orthopedics;  Laterality: Right;  . LEFT HEART CATH AND CORONARY ANGIOGRAPHY N/A 03/28/2017   Procedure: Left Heart Cath and Coronary Angiography;  Surgeon: Lorretta Harp, MD;  Location: [facility];  Service: Cardiovascular;  Laterality: N/A;    Social History   Socioeconomic History  . Marital status: Married    Spouse name: Not on file  . Number of children: Not on file  . Years of education: Not on file  . Highest education level: Not on file  Occupational History  . Not on file  Tobacco Use  . Smoking status: Current Every Day Smoker    Packs/day: 1.00    Years: 30.00    Pack years: 30.00  . Smokeless tobacco: Never Used  Vaping Use  . Vaping Use: Never used  Substance and Sexual Activity  . Alcohol use: Not Currently    Alcohol/week: 0.0 standard drinks    Comment: 1-2 drinks/week.  . Drug use: No  . Sexual activity: Yes    Birth control/protection: I.U.D., Post-menopausal  Other Topics Concern  . Not on file  Social History Narrative   Lives in [place] with her husband.  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   Social Determinants of Health   Financial Resource Strain: Not on file  Food Insecurity: Not on file  Transportation Needs: Not on file  Physical Activity: Not on file  Stress: Not on file  Social Connections: Not on file  Intimate Partner Violence: Not on file     Family History  Problem Relation Age of Onset  . Diabetes Mother        alive @ 39  . Heart disease Father        died of MI @ 45  . Osteoporosis Maternal Grandmother   . Colon cancer Maternal Grandfather   . Other Other        3 older brothers, alive and well.  . Breast cancer Neg Hx   . Ovarian cancer Neg Hx      Current Facility-Administered Medications:  .  0.9 %  sodium chloride infusion, , Intravenous, Continuous, Poggi, Marshall Cork, MD, Last Rate: 75 mL/hr at 09/16/20 0913, New Bag at 09/16/20 0913 .   acetaminophen (TYLENOL) tablet 1,000 mg, 1,000 mg, Oral, Q6H, Poggi, Marshall Cork, MD, 1,000 mg at 09/16/20 1242 .  acetaminophen (TYLENOL) tablet 325-650 mg, 325-650 mg, Oral, Q6H PRN, Poggi, Marshall Cork, MD .  ALPRAZolam Duanne Moron) tablet 0.5 mg, 0.5 mg, Oral, BID PRN, Poggi, Marshall Cork, MD, 0.5 mg at 09/16/20 0920 .  bisacodyl (DULCOLAX) suppository 10 mg, 10 mg, Rectal, Daily PRN, Poggi, Marshall Cork, MD .  diphenhydrAMINE (BENADRYL) 12.5 MG/5ML elixir 12.5-25 mg, 12.5-25 mg, Oral, Q4H PRN, Poggi, Marshall Cork, MD .  docusate sodium (COLACE) capsule 100 mg, 100 mg, Oral, BID, Poggi, Marshall Cork, MD, 100 mg at 09/16/20 0910 .  enoxaparin (LOVENOX) injection 40 mg, 40 mg, Subcutaneous, Q24H, Poggi, Marshall Cork, MD, 40 mg at 09/16/20 0911 .  HYDROmorphone (DILAUDID) injection 0.5-1 mg, 0.5-1 mg, Intravenous, Q2H PRN, Poggi, Marshall Cork, MD .  ketorolac (TORADOL) 15 MG/ML injection 15 mg, 15 mg, Intravenous, Q6H, Poggi, Marshall Cork, MD, 15 mg at 09/16/20 1242 .  magnesium hydroxide (MILK OF MAGNESIA)  suspension 30 mL, 30 mL, Oral, Daily PRN, Poggi, Marshall Cork, MD .  metoCLOPramide (REGLAN) tablet 5-10 mg, 5-10 mg, Oral, Q8H PRN **OR** metoCLOPramide (REGLAN) injection 5-10 mg, 5-10 mg, Intravenous, Q8H PRN, Poggi, Marshall Cork, MD .  metoprolol succinate (TOPROL-XL) 24 hr tablet 25 mg, 25 mg, Oral, Daily, Sreenath, Sudheer B, MD .  naloxone [facility]) injection 0.4 mg, 0.4 mg, Intravenous, PRN, Poggi, Marshall Cork, MD .  ondansetron (ZOFRAN) tablet 4 mg, 4 mg, Oral, Q6H PRN, 4 mg at 09/16/20 0919 **OR** ondansetron (ZOFRAN) injection 4 mg, 4 mg, Intravenous, Q6H PRN, Poggi, Marshall Cork, MD .  oxyCODONE (Oxy IR/ROXICODONE) immediate release tablet 5-10 mg, 5-10 mg, Oral, Q4H PRN, Poggi, Marshall Cork, MD, 10 mg at 09/16/20 0910 .  senna-docusate (Senokot-S) tablet 1 tablet, 1 tablet, Oral, BID, Sreenath, Sudheer B, MD .  sodium phosphate (FLEET) 7-19 GM/118ML enema 1 enema, 1 enema, Rectal, Once PRN, Corky Mull, MD   Physical exam:  Vitals:   09/16/20 0500 09/16/20 0545  09/16/20 0728 09/16/20 1152  BP:  138/90 (!) 145/94 119/81  Pulse:  (!) 101 97 (!) 101  Resp:  16 15 16   Temp:  98.4 F (36.9 C) 98.3 F (36.8 C) 98.1 F (36.7 C)  TempSrc:   Oral Oral  SpO2:  98% 98% 100%  Weight: 195 lb 6.4 oz (88.6 kg)     Height:       Physical Exam Constitutional:      General: She is not in acute distress. Eyes:     Extraocular Movements: EOM normal.  Cardiovascular:     Rate and Rhythm: Normal rate and regular rhythm.     Heart sounds: Normal heart sounds.  Pulmonary:     Effort: Pulmonary effort is normal.     Breath sounds: Normal breath sounds.  Abdominal:     General: Bowel sounds are normal.     Palpations: Abdomen is soft.  Skin:    General: Skin is warm and dry.  Neurological:     Mental Status: She is alert and oriented to person, place, and time.        CMP Latest Ref Rng & Units 09/16/2020  Glucose 70 - 99 mg/dL 141(H)  BUN 6 - 20 mg/dL 16  Creatinine 0.44 - 1.00 mg/dL 0.61  Sodium 135 - 145 mmol/L 138  Potassium 3.5 - 5.1 mmol/L 4.2  Chloride 98 - 111 mmol/L 106  CO2 22 - 32 mmol/L 23  Calcium 8.9 - 10.3 mg/dL 8.6(L)  Total Protein 6.5 - 8.1 g/dL -  Total Bilirubin 0.3 - 1.2 mg/dL -  Alkaline Phos 38 - 126 U/L -  AST 15 - 41 U/L -  ALT 0 - 44 U/L -   CBC Latest Ref Rng & Units 09/16/2020  WBC 4.0 - 10.5 K/uL 13.1(H)  Hemoglobin 12.0 - 15.0 g/dL 12.3  Hematocrit 36.0 - 46.0 % 37.2  Platelets 150 - 400 K/uL 289    @IMAGES @  DG Pelvis 1-2 Views  Result Date: 09/14/2020 CLINICAL DATA:  Thigh and leg pain EXAM: PELVIS - 1-2 VIEW COMPARISON:  None. FINDINGS: There is no evidence of pelvic fracture or diastasis. The sacroiliac joints are intact. The visualized deep pelvis is unremarkable. IMPRESSION: Negative. Electronically Signed   By: Prudencio Pair M.D.   On: 09/14/2020 19:54   MR FEMUR RIGHT W WO CONTRAST  Result Date: 09/15/2020 CLINICAL DATA:  Right leg and thigh pain EXAM: MRI OF THE RIGHT FEMUR WITHOUT AND WITH  CONTRAST  TECHNIQUE: Multiplanar, multisequence MR imaging of the right was performed both before and after administration of intravenous contrast. CONTRAST:  7 mL Gadavist COMPARISON:  None. FINDINGS: Bones/Joint/Cartilage There is a comminuted impacted laterally angulated fracture of the proximal femoral shaft. There is an underlying heterogeneously enhancing intramedullary T1 dark/T2 bright osseous lesion within this region. There does appear to be possible heterogeneous hematoma seen along the proximal anteromedial aspect of the femoral shaft. No other acute osseous abnormality is seen. A small hip joint effusion is seen. Ligaments Suboptimally visualized Muscles and Tendons There is also surrounding non loculated fluid and muscular edema seen. The edema extends around the anterolateral aspect of the femur musculature and within the adductor musculature. The visualized portion of the tendons appear to be grossly intact. Soft tissues Diffuse subcutaneous and soft tissue edema seen surrounding the antro lateral aspect of the proximal femur. IMPRESSION: Comminuted impacted laterally angulated proximal femoral shaft fracture. There does appear to be a heterogeneously enhancing osseous lesion within this area which is non-specific could be osseous neoplasm or metastatic lesion. Heterogeneously enhancing hematoma , seen along the medial aspect of the proximal femoral shaft. Diffuse muscular edema seen surrounding the proximal femur. Electronically Signed   By: Prudencio Pair M.D.   On: 09/15/2020 02:23   CT CHEST ABDOMEN PELVIS W CONTRAST  Result Date: 09/15/2020 CLINICAL DATA:  Possible pathologic femoral fracture, metastatic evaluation EXAM: CT CHEST, ABDOMEN, AND PELVIS WITH CONTRAST TECHNIQUE: Multidetector CT imaging of the chest, abdomen and pelvis was performed following the standard protocol during bolus administration of intravenous contrast. CONTRAST:  173m OMNIPAQUE IOHEXOL 300 MG/ML  SOLN COMPARISON:  None.  FINDINGS: CT CHEST FINDINGS Cardiovascular: Normal heart size. No pericardial effusion. Thoracic aorta is normal in caliber. Mediastinum/Nodes: Mediastinal adenopathy is present. For example, enlarged pretracheal node measuring 2.2 x 1.5 cm; right paratracheal node measuring 2.7 x 1.3 cm. Prevascular node measuring 0.3 x 0.9 cm. There is a 1 cm nodule of the partially imaged right thyroid lobe. Esophagus is unremarkable. No hilar or axillary adenopathy. Lungs/Pleura: There is a 5 x 4 mm nodule the right middle lobe (series 4, image 83). Dependent atelectasis/scarring. No pleural effusion. Musculoskeletal: No chest wall mass or suspicious bone lesion. CT ABDOMEN PELVIS FINDINGS Hepatobiliary: Stable small cyst at the dome of the liver. Two areas of subcapsular ill-defined low-attenuation in the left hepatic lobe measuring up to 1.8 cm (series 2, image 46). Gallbladder is unremarkable. No biliary dilatation. Pancreas: Unremarkable. Spleen: Unremarkable. Adrenals/Urinary Tract: Right adrenal is unremarkable. There is a 2.8 x 1.9 cm left adrenal lesion. Too small to characterize low-attenuation lesion of the upper pole of the right kidney. Small cyst of the lower pole of the left kidney. 3 mm nonobstructing calculus of the upper pole the right kidney. Ureters are unremarkable. There is mild layering hyperdensity in the bladder. Stomach/Bowel: Stomach is within normal limits. Bowel is normal in caliber. Distal colonic diverticulosis. Normal appendix. Vascular/Lymphatic: Aortic atherosclerosis. No enlarged lymph nodes. Reproductive: Uterus and bilateral adnexa are unremarkable. Other: No ascites.  Abdominal wall is unremarkable. Musculoskeletal: Comminuted and angulated proximal right femoral shaft fracture with adjacent hematoma as described previously. Transitional anatomy at the lumbosacral junction. IMPRESSION: Mediastinal adenopathy. 4.5 mm right middle lobe nodule. 1 cm right thyroid lobe nodule. Ultrasound is not  recommended by current guidelines. (Ref: J Am Coll Radiol. 2015 Feb;12(2): 143-50). Indeterminate 2.8 cm left adrenal lesion. Two small areas of subcapsular left hepatic lobe low density, which could reflect focal fat  or lesions. Layering mild hyperdensity within the bladder, which could reflect proteinaceous material or blood products. Electronically Signed   By: Macy Mis M.D.   On: 09/15/2020 09:42   DG HIP OPERATIVE UNILAT W OR W/O PELVIS RIGHT  Result Date: 09/15/2020 CLINICAL DATA:  Known pathologic right femoral fracture EXAM: OPERATIVE RIGHT HIP WITH PELVIS COMPARISON:  09/14/2020 FLUOROSCOPY TIME:  Radiation Exposure Index (as provided by the fluoroscopic device): 9.24 mGy If the device does not provide the exposure index: Fluoroscopy Time:  56 seconds Number of Acquired Images:  4 FINDINGS: Medullary rod is noted within the right femur with proximal and distal fixation screws. The previously seen fracture is well aligned. A permeative pattern is noted within the underlying bone stable from the prior exam. IMPRESSION: ORIF of right femoral fracture. Electronically Signed   By: Inez Catalina M.D.   On: 09/15/2020 16:41   DG FEMUR, MIN 2 VIEWS RIGHT  Result Date: 09/14/2020 CLINICAL DATA:  Pain for 1 month EXAM: RIGHT FEMUR 2 VIEWS COMPARISON:  None. FINDINGS: There is a comminuted mildly angulated proximal right femoral shaft fracture. There is question of underlying mottled lesion seen within the midshaft of the femur. The femoral head is still well seated within the acetabulum. IMPRESSION: Comminuted mildly angulated proximal right femur fracture. There is question of an underlying mottled lesion. Would recommend nonemergent MRI with contrast for further evaluation. Electronically Signed   By: Prudencio Pair M.D.   On: 09/14/2020 19:53    Assessment and plan- Patient is a 55 y.o. female admitted for right hip fracture with some findings concerning for possible osseous lesion in that area along  with other findings of mediastinal adenopathy and left adrenal lesion  At this time I will await pathology results from her hip surgery.  I would recommend getting an outpatient PET CT scan which I will arrange upon her discharge.  I will discuss her case at tumor board after PET CT scan is done and based on pathology results from present specimen will determine if a repeat EBUS guided mediastinal biopsy is needed  I have explained all this to patient and her husband in detail   Visit Diagnosis 1. Fall   2. Pain   3. Pathologic fracture   4. Surgery, elective   5. Right femoral shaft fracture (HCC)     Dr. Randa Evens, MD, MPH [facility] at [facility] [phone] 09/16/2020  4:49 PM

## 2020-09-16 NOTE — Progress Notes (Signed)
For progress notes from this date 09/15/2020 please see note signed 4:05 PM, mislabeled as H&P  Ralene Muskrat MD

## 2020-09-16 NOTE — Progress Notes (Signed)
Physical Therapy Treatment Patient Details Name: Gina Sanchez MRN: [id number] DOB: 1966-04-14 Today's Date: 09/16/2020    History of Present Illness 55 y.o. female with medical history significant for hypertension, hyperlipidemia, generalized anxiety disorder who is admitted to [facility] on 09/14/2020 with an acute right proximal femoral shaft fracture.  Had been having thigh pain for a few weeks, now s/p nailing 09/15/20.    PT Comments    Pt continues to make good gains, much less anxiety this afternoon and HR stayed below 110 the entire time during ambulation and stair negotiation.  She continues to have expected mid thigh pain but showed good ability to maintain PWBing and appropriate walker usage.  Overall good effort and appropriate post-op improvement.    Follow Up Recommendations  Follow surgeon's recommendation for DC plan and follow-up therapies     Equipment Recommendations  Rolling walker with 5" wheels    Recommendations for Other Services       Precautions / Restrictions Precautions Precautions: Fall Restrictions Weight Bearing Restrictions: Yes RLE Weight Bearing: Partial weight bearing RLE Partial Weight Bearing Percentage or Pounds: 50    Mobility  Bed Mobility Overal bed mobility: Modified Independent             General bed mobility comments: in recliner on arrival and post session  Transfers Overall transfer level: Modified independent Equipment used: Rolling walker (2 wheeled)             General transfer comment: heavy use of UEs, extra cuing but no direct assist to attain standing  Ambulation/Gait Ambulation/Gait assistance: Modified independent (Device/Increase time) Gait Distance (Feet): 250 Feet Assistive device: Rolling walker (2 wheeled)       General Gait Details: Pt with significant improvement with ambulation this afternoon and maintain consistent stop-go pattern to insure PWBing, HR remained in the  100-110 range the entire time   Stairs Stairs: Yes Stairs assistance: Min guard Stair Management: One rail Right;Sideways Number of Stairs: 4 General stair comments: Pt showed great effort with stair management, she was highly reliant on the rail/UEs but ultimately managed 4 steps w/o assist and only minimal increased cuing after inital explaination   Wheelchair Mobility    Modified Rankin (Stroke Patients Only)       Balance Overall balance assessment: Modified Independent                                          Cognition Arousal/Alertness: Awake/alert Behavior During Therapy: WFL for tasks assessed/performed;Anxious Overall Cognitive Status: Within Functional Limits for tasks assessed                                        Exercises General Exercises - Lower Extremity Ankle Circles/Pumps: AROM;10 reps Quad Sets: Strengthening;10 reps Short Arc Quad: Strengthening;10 reps Long Arc Quad: Strengthening;10 reps Heel Slides: Strengthening;10 reps Hip ABduction/ADduction: Strengthening;10 reps    General Comments        Pertinent Vitals/Pain Pain Assessment: 0-10 Pain Score: 5     Home Living Family/patient expects to be discharged to:: Private residence Living Arrangements: Spouse/significant other   Type of Home: House Home Access: Stairs to enter Entrance Stairs-Rails: Can reach both Home Layout: One level Home Equipment: Bedside commode      Prior Function Level of  Independence: Independent      Comments: Pt able to do all she needs w/o assist   PT Goals (current goals can now be found in the care plan section) Acute Rehab PT Goals Patient Stated Goal: go home PT Goal Formulation: With patient Time For Goal Achievement: 09/30/20 Potential to Achieve Goals: Good Progress towards PT goals: Progressing toward goals    Frequency    BID      PT Plan Current plan remains appropriate    Co-evaluation               AM-PAC PT "6 Clicks" Mobility   Outcome Measure  Help needed turning from your back to your side while in a flat bed without using bedrails?: A Little Help needed moving from lying on your back to sitting on the side of a flat bed without using bedrails?: A Little Help needed moving to and from a bed to a chair (including a wheelchair)?: A Little Help needed standing up from a chair using your arms (e.g., wheelchair or bedside chair)?: A Little Help needed to walk in hospital room?: A Little Help needed climbing 3-5 steps with a railing? : A Lot 6 Click Score: 17    End of Session Equipment Utilized During Treatment: Gait belt Activity Tolerance: Patient tolerated treatment well Patient left: with call bell/phone within reach;with chair alarm set   PT Visit Diagnosis: Muscle weakness (generalized) (M62.81);Difficulty in walking, not elsewhere classified (R26.2)     Time: 3009-2330 PT Time Calculation (min) (ACUTE ONLY): 54 min  Charges:  $Gait Training: 23-37 mins $Therapeutic Exercise: 8-22 mins $Therapeutic Activity: 8-22 mins                     Kreg Shropshire, DPT 09/16/2020, 4:19 PM

## 2020-09-16 NOTE — Progress Notes (Signed)
PROGRESS NOTE    Gina Sanchez  UTM:[id number] DOB: [birth year] DOA: 09/14/2020 PCP: Remi Haggard, FNP    Brief Narrative:  55 y.o. female with medical history significant for hypertension, hyperlipidemia, generalized anxiety disorder who is admitted to [facility] on 09/14/2020 with an acute right proximal femoral shaft fracture after presenting from home to [facility] Emergency Department complaining of right thigh pain.   The patient reports approximately 1 month of right thigh/right hip pain, over which time she reports that she underwent outpatient plain films of the right hip and right femur demonstrating no evidence of acute fracture.  Over that time, she reports progressive, sharp pain involving the right thigh as well as the right hip, which worsens with ambulation.  Denies any preceding trauma or fall, and confirms that the right hip represents an native joint for her.  Earlier today, she reports rolling over in bed, at which time she heard an audible "popping" sound from her right thigh/right hip, immediately following which she developed pain of greater severity relative to that that she has been experiencing over the course of the last month  Plan for operative fixation at [facility] 09/15/2020.  Case discussed with orthopedic surgery.  Concern for pathologic fracture as noted on MRI.  CT chest abdomen pelvis with contrast ordered for metastatic evaluation.  Status post operative fixation 09/15/2020 with orthopedics.  Pain well controlled postoperatively.   Assessment & Plan:   Principal Problem:   Right femoral shaft fracture (HCC) Active Problems:   Essential hypertension   Hypercholesteremia   Right hip pain   Leukocytosis   GAD (generalized anxiety disorder)  Femoral shaft fracture, right, POA 1 month of progressive right hip pain Significant increase after nontraumatic popping identified by patient on the evening of admission No fall or trauma Radiographs  commuted mildly angulated proximal right femoral shaft fracture MRI redemonstrates area of lucency concerning for possible osseous or metastatic lesion Neurovascularly intact 1/3: Operative fixation with orthopedics Plan: Postoperative pain control Therapy evaluations Follow intraoperative pathology Bowel regimen Oncology consult requested for recommendations and evaluation of osseous lesion  Concern for pathologic fracture Area of lucency noted on MRI possible osseous or metastatic lesion.  Follow-up CT chest abdomen pelvis demonstrates mediastinal adenopathy.  Patient is a smoker.  Small lung nodule was noted on CAT scan. Plan: Oncology evaluation requested Message sent to Dr. Janese Banks Recommendations appreciated  Hypertension Home metoprolol 25 mg daily started  Hyperlipidemia On Zetia on hold  Anxiety As needed Xanax     DVT prophylaxis: Lovenox Code Status: Full Family Communication: None today.  Offered to call the patient declined disposition Plan: Status is: Inpatient  Remains inpatient appropriate because:Inpatient level of care appropriate due to severity of illness   Dispo: The patient is from: Home              Anticipated d/c is to: Home              Anticipated d/c date is: 2 days              Patient currently is not medically stable to d/c.   Postoperative day 1 status post surgical repair femur fracture on 09/15/2020.  Therapy evaluations requested.  Postoperative pain control.  Bowel regimen.  Oncology evaluation requested for concerning osseous lesion.     Consultants:   Orthopedics  Oncology  Procedures:   Hip fracture repair, 09/15/2020  Antimicrobials:   Perioperative Ancef   Subjective: Patient seen and examined.  Postoperative day 1.  Pain well controlled stationary.  Patient does not try to ambulate.  Objective: Vitals:   09/16/20 0500 09/16/20 0545 09/16/20 0728 09/16/20 1152  BP:  138/90 (!) 145/94 119/81  Pulse:  (!) 101 97 (!)  101  Resp:  16 15 16   Temp:  98.4 F (36.9 C) 98.3 F (36.8 C)   TempSrc:   Oral   SpO2:  98% 98% 100%  Weight: 88.6 kg     Height:        Intake/Output Summary (Last 24 hours) at 09/16/2020 1316 Last data filed at 09/16/2020 0631 Gross per 24 hour  Intake 3996.37 ml  Output 700 ml  Net 3296.37 ml   Filed Weights   09/14/20 1820 09/16/20 0500  Weight: 86.6 kg 88.6 kg    Examination:  General exam: No acute distress Respiratory system: Clear to auscultation. Respiratory effort normal. Cardiovascular system: S1 & S2 heard, RRR. No JVD, murmurs, rubs, gallops or clicks. No pedal edema. Gastrointestinal system: Abdomen is nondistended, soft and nontender. No organomegaly or masses felt. Normal bowel sounds heard. Central nervous system: Alert and oriented. No focal neurological deficits. Extremities: Decreased power on right.  Right lower extremity in surgical wraps.  Ambulation and mobility not assessed. Skin: No rashes, lesions or ulcers Psychiatry: Judgement and insight appear normal. Mood & affect appropriate.     Data Reviewed: I have personally reviewed following labs and imaging studies  CBC: Recent Labs  Lab 09/14/20 1914 09/15/20 0322 09/16/20 0530  WBC 15.4* 15.6* 13.1*  NEUTROABS 12.0* 11.3*  --   HGB 13.7 13.2 12.3  HCT 40.6 39.2 37.2  MCV 84.9 85.2 86.5  PLT 337 305 962   Basic Metabolic Panel: Recent Labs  Lab 09/14/20 1914 09/15/20 0322 09/15/20 1819 09/16/20 0530  NA 137 137  --  138  K 3.7 3.5  --  4.2  CL 103 104  --  106  CO2 24 24  --  23  GLUCOSE 118* 105*  --  141*  BUN 15 17  --  16  CREATININE 0.63 0.63 0.60 0.61  CALCIUM 9.1 8.7*  --  8.6*  MG  --  2.1  --   --    GFR: Estimated Creatinine Clearance: 93 mL/min (by C-G formula based on SCr of 0.61 mg/dL). Liver Function Tests: Recent Labs  Lab 09/14/20 1914  AST 16  ALT 16  ALKPHOS 84  BILITOT 1.0  PROT 7.0  ALBUMIN 3.6   No results for input(s): LIPASE, AMYLASE in the  last 168 hours. No results for input(s): AMMONIA in the last 168 hours. Coagulation Profile: Recent Labs  Lab 09/14/20 1914  INR 1.1   Cardiac Enzymes: No results for input(s): CKTOTAL, CKMB, CKMBINDEX, TROPONINI in the last 168 hours. BNP (last 3 results) No results for input(s): PROBNP in the last 8760 hours. HbA1C: No results for input(s): HGBA1C in the last 72 hours. CBG: No results for input(s): GLUCAP in the last 168 hours. Lipid Profile: No results for input(s): CHOL, HDL, LDLCALC, TRIG, CHOLHDL, LDLDIRECT in the last 72 hours. Thyroid Function Tests: No results for input(s): TSH, T4TOTAL, FREET4, T3FREE, THYROIDAB in the last 72 hours. Anemia Panel: No results for input(s): VITAMINB12, FOLATE, FERRITIN, TIBC, IRON, RETICCTPCT in the last 72 hours. Sepsis Labs: No results for input(s): PROCALCITON, LATICACIDVEN in the last 168 hours.  Recent Results (from the past 240 hour(s))  Resp Panel by RT-PCR (Flu A&B, Covid) Nasopharyngeal Swab  Status: None   Collection Time: 09/14/20  7:20 PM   Specimen: Nasopharyngeal Swab; Nasopharyngeal(NP) swabs in vial transport medium  Result Value Ref Range Status   SARS Coronavirus 2 by RT PCR NEGATIVE NEGATIVE Final    Comment: (NOTE) SARS-CoV-2 target nucleic acids are NOT DETECTED.  The SARS-CoV-2 RNA is generally detectable in upper respiratory specimens during the acute phase of infection. The lowest concentration of SARS-CoV-2 viral copies this assay can detect is 138 copies/mL. A negative result does not preclude SARS-Cov-2 infection and should not be used as the sole basis for treatment or other patient management decisions. A negative result may occur with  improper specimen collection/handling, submission of specimen other than nasopharyngeal swab, presence of viral mutation(s) within the areas targeted by this assay, and inadequate number of viral copies(<138 copies/mL). A negative result must be combined with clinical  observations, patient history, and epidemiological information. The expected result is Negative.  Fact Sheet for Patients:  EntrepreneurPulse.com.au  Fact Sheet for Healthcare Providers:  IncredibleEmployment.be  This test is no t yet approved or cleared by the [place] FDA and  has been authorized for detection and/or diagnosis of SARS-CoV-2 by FDA under an Emergency Use Authorization (EUA). This EUA will remain  in effect (meaning this test can be used) for the duration of the COVID-19 declaration under Section 564(b)(1) of the Act, 21 U.S.C.section 360bbb-3(b)(1), unless the authorization is terminated  or revoked sooner.       Influenza A by PCR NEGATIVE NEGATIVE Final   Influenza B by PCR NEGATIVE NEGATIVE Final    Comment: (NOTE) The Xpert Xpress SARS-CoV-2/FLU/RSV plus assay is intended as an aid in the diagnosis of influenza from Nasopharyngeal swab specimens and should not be used as a sole basis for treatment. Nasal washings and aspirates are unacceptable for Xpert Xpress SARS-CoV-2/FLU/RSV testing.  Fact Sheet for Patients: EntrepreneurPulse.com.au  Fact Sheet for Healthcare Providers: IncredibleEmployment.be  This test is not yet approved or cleared by the [place] FDA and has been authorized for detection and/or diagnosis of SARS-CoV-2 by FDA under an Emergency Use Authorization (EUA). This EUA will remain in effect (meaning this test can be used) for the duration of the COVID-19 declaration under Section 564(b)(1) of the Act, 21 U.S.C. section 360bbb-3(b)(1), unless the authorization is terminated or revoked.  Performed at [facility], [address], [place], [place] [address]          Radiology Studies: DG Pelvis 1-2 Views  Result Date: 09/14/2020 CLINICAL DATA:  Thigh and leg pain EXAM: PELVIS - 1-2 VIEW COMPARISON:  None. FINDINGS: There is no evidence of  pelvic fracture or diastasis. The sacroiliac joints are intact. The visualized deep pelvis is unremarkable. IMPRESSION: Negative. Electronically Signed   By: Prudencio Pair M.D.   On: 09/14/2020 19:54   MR FEMUR RIGHT W WO CONTRAST  Result Date: 09/15/2020 CLINICAL DATA:  Right leg and thigh pain EXAM: MRI OF THE RIGHT FEMUR WITHOUT AND WITH CONTRAST TECHNIQUE: Multiplanar, multisequence MR imaging of the right was performed both before and after administration of intravenous contrast. CONTRAST:  7 mL Gadavist COMPARISON:  None. FINDINGS: Bones/Joint/Cartilage There is a comminuted impacted laterally angulated fracture of the proximal femoral shaft. There is an underlying heterogeneously enhancing intramedullary T1 dark/T2 bright osseous lesion within this region. There does appear to be possible heterogeneous hematoma seen along the proximal anteromedial aspect of the femoral shaft. No other acute osseous abnormality is seen. A small hip joint effusion is seen.  Ligaments Suboptimally visualized Muscles and Tendons There is also surrounding non loculated fluid and muscular edema seen. The edema extends around the anterolateral aspect of the femur musculature and within the adductor musculature. The visualized portion of the tendons appear to be grossly intact. Soft tissues Diffuse subcutaneous and soft tissue edema seen surrounding the antro lateral aspect of the proximal femur. IMPRESSION: Comminuted impacted laterally angulated proximal femoral shaft fracture. There does appear to be a heterogeneously enhancing osseous lesion within this area which is non-specific could be osseous neoplasm or metastatic lesion. Heterogeneously enhancing hematoma , seen along the medial aspect of the proximal femoral shaft. Diffuse muscular edema seen surrounding the proximal femur. Electronically Signed   By: Prudencio Pair M.D.   On: 09/15/2020 02:23   CT CHEST ABDOMEN PELVIS W CONTRAST  Result Date: 09/15/2020 CLINICAL DATA:   Possible pathologic femoral fracture, metastatic evaluation EXAM: CT CHEST, ABDOMEN, AND PELVIS WITH CONTRAST TECHNIQUE: Multidetector CT imaging of the chest, abdomen and pelvis was performed following the standard protocol during bolus administration of intravenous contrast. CONTRAST:  18m OMNIPAQUE IOHEXOL 300 MG/ML  SOLN COMPARISON:  None. FINDINGS: CT CHEST FINDINGS Cardiovascular: Normal heart size. No pericardial effusion. Thoracic aorta is normal in caliber. Mediastinum/Nodes: Mediastinal adenopathy is present. For example, enlarged pretracheal node measuring 2.2 x 1.5 cm; right paratracheal node measuring 2.7 x 1.3 cm. Prevascular node measuring 0.3 x 0.9 cm. There is a 1 cm nodule of the partially imaged right thyroid lobe. Esophagus is unremarkable. No hilar or axillary adenopathy. Lungs/Pleura: There is a 5 x 4 mm nodule the right middle lobe (series 4, image 83). Dependent atelectasis/scarring. No pleural effusion. Musculoskeletal: No chest wall mass or suspicious bone lesion. CT ABDOMEN PELVIS FINDINGS Hepatobiliary: Stable small cyst at the dome of the liver. Two areas of subcapsular ill-defined low-attenuation in the left hepatic lobe measuring up to 1.8 cm (series 2, image 46). Gallbladder is unremarkable. No biliary dilatation. Pancreas: Unremarkable. Spleen: Unremarkable. Adrenals/Urinary Tract: Right adrenal is unremarkable. There is a 2.8 x 1.9 cm left adrenal lesion. Too small to characterize low-attenuation lesion of the upper pole of the right kidney. Small cyst of the lower pole of the left kidney. 3 mm nonobstructing calculus of the upper pole the right kidney. Ureters are unremarkable. There is mild layering hyperdensity in the bladder. Stomach/Bowel: Stomach is within normal limits. Bowel is normal in caliber. Distal colonic diverticulosis. Normal appendix. Vascular/Lymphatic: Aortic atherosclerosis. No enlarged lymph nodes. Reproductive: Uterus and bilateral adnexa are unremarkable.  Other: No ascites.  Abdominal wall is unremarkable. Musculoskeletal: Comminuted and angulated proximal right femoral shaft fracture with adjacent hematoma as described previously. Transitional anatomy at the lumbosacral junction. IMPRESSION: Mediastinal adenopathy. 4.5 mm right middle lobe nodule. 1 cm right thyroid lobe nodule. Ultrasound is not recommended by current guidelines. (Ref: J Am Coll Radiol. 2015 Feb;12(2): 143-50). Indeterminate 2.8 cm left adrenal lesion. Two small areas of subcapsular left hepatic lobe low density, which could reflect focal fat or lesions. Layering mild hyperdensity within the bladder, which could reflect proteinaceous material or blood products. Electronically Signed   By: PMacy MisM.D.   On: 09/15/2020 09:42   DG HIP OPERATIVE UNILAT W OR W/O PELVIS RIGHT  Result Date: 09/15/2020 CLINICAL DATA:  Known pathologic right femoral fracture EXAM: OPERATIVE RIGHT HIP WITH PELVIS COMPARISON:  09/14/2020 FLUOROSCOPY TIME:  Radiation Exposure Index (as provided by the fluoroscopic device): 9.24 mGy If the device does not provide the exposure index: Fluoroscopy Time:  56 seconds  Number of Acquired Images:  4 FINDINGS: Medullary rod is noted within the right femur with proximal and distal fixation screws. The previously seen fracture is well aligned. A permeative pattern is noted within the underlying bone stable from the prior exam. IMPRESSION: ORIF of right femoral fracture. Electronically Signed   By: Inez Catalina M.D.   On: 09/15/2020 16:41   DG FEMUR, MIN 2 VIEWS RIGHT  Result Date: 09/14/2020 CLINICAL DATA:  Pain for 1 month EXAM: RIGHT FEMUR 2 VIEWS COMPARISON:  None. FINDINGS: There is a comminuted mildly angulated proximal right femoral shaft fracture. There is question of underlying mottled lesion seen within the midshaft of the femur. The femoral head is still well seated within the acetabulum. IMPRESSION: Comminuted mildly angulated proximal right femur fracture. There  is question of an underlying mottled lesion. Would recommend nonemergent MRI with contrast for further evaluation. Electronically Signed   By: Prudencio Pair M.D.   On: 09/14/2020 19:53        Scheduled Meds: . acetaminophen  1,000 mg Oral Q6H  . docusate sodium  100 mg Oral BID  . enoxaparin (LOVENOX) injection  40 mg Subcutaneous Q24H  . ketorolac  15 mg Intravenous Q6H  . nebivolol  10 mg Oral Daily   Continuous Infusions: . sodium chloride 75 mL/hr at 09/16/20 0913     LOS: 2 days    Time spent: 25 minutes    Sidney Ace, MD [facility] Pager [phone]  If 7PM-7AM, please contact night-coverage 09/16/2020, 1:16 PM

## 2020-09-16 NOTE — Progress Notes (Signed)
  Subjective: 1 Day Post-Op Procedure(s) (LRB): INTRAMEDULLARY (IM) NAIL INTERTROCHANTRIC (Right) Patient reports pain as mild.   Patient is well, and has had no acute complaints or problems Plan is to go Home after hospital stay. Negative for chest pain and shortness of breath Fever: no Gastrointestinal:Negative for nausea and vomiting, did have some nausea this AM. Patient is passing gas without pain.  Objective: Vital signs in last 24 hours: Temp:  [97 F (36.1 C)-98.4 F (36.9 C)] 98.3 F (36.8 C) (01/04 0728) Pulse Rate:  [90-108] 101 (01/04 1152) Resp:  [14-18] 16 (01/04 1152) BP: (119-161)/(81-94) 119/81 (01/04 1152) SpO2:  [91 %-100 %] 100 % (01/04 1152) Weight:  [88.6 kg] 88.6 kg (01/04 0500)  Intake/Output from previous day:  Intake/Output Summary (Last 24 hours) at 09/16/2020 1331 Last data filed at 09/16/2020 0631 Gross per 24 hour  Intake 3996.37 ml  Output 700 ml  Net 3296.37 ml    Intake/Output this shift: No intake/output data recorded.  Labs: Recent Labs    09/14/20 1914 09/15/20 0322 09/16/20 0530  HGB 13.7 13.2 12.3   Recent Labs    09/15/20 0322 09/16/20 0530  WBC 15.6* 13.1*  RBC 4.60 4.30  HCT 39.2 37.2  PLT 305 289   Recent Labs    09/15/20 0322 09/15/20 1819 09/16/20 0530  NA 137  --  138  K 3.5  --  4.2  CL 104  --  106  CO2 24  --  23  BUN 17  --  16  CREATININE 0.63 0.60 0.61  GLUCOSE 105*  --  141*  CALCIUM 8.7*  --  8.6*   Recent Labs    09/14/20 1914  INR 1.1     EXAM General - Patient is Alert, Appropriate and Oriented Extremity - ABD soft Sensation intact distally Intact pulses distally Dorsiflexion/Plantar flexion intact Incision: dressing C/D/I No cellulitis present Dressing/Incision - clean, dry, no drainage Motor Function - intact, moving foot and toes well on exam.  Abdomen soft with normal bowel sounds.  Past Medical History:  Diagnosis Date  . Abnormal Pap smear of cervix   . Anxiety   .  Depression   . Essential hypertension   . Palpitations   . Precordial chest pain     Assessment/Plan: 1 Day Post-Op Procedure(s) (LRB): INTRAMEDULLARY (IM) NAIL INTERTROCHANTRIC (Right) Principal Problem:   Right femoral shaft fracture (HCC) Active Problems:   Essential hypertension   Hypercholesteremia   Right hip pain   Leukocytosis   GAD (generalized anxiety disorder)  Estimated body mass index is 30.6 kg/m as calculated from the following:   Height as of this encounter: 5' 7"  (1.702 m).   Weight as of this encounter: 88.6 kg. Advance diet Up with therapy D/C IV fluids when tolerating po intake.  Labs reviewed this AM, WBC 13.1 this AM. Patient is passing gas today.  COntinue with PT. Placed for for TED Hose stockings to be placed. Concern for pathologic fracture, oncology has been consulted.  DVT Prophylaxis - Lovenox and SCDs Partial weightbearing to the right leg  J. Cameron Proud, PA-C [facility] Surgery 09/16/2020, 1:31 PM

## 2020-09-16 NOTE — Plan of Care (Signed)

## 2020-09-17 ENCOUNTER — Other Ambulatory Visit: Payer: Self-pay

## 2020-09-17 DIAGNOSIS — E785 Hyperlipidemia, unspecified: Secondary | ICD-10-CM

## 2020-09-17 DIAGNOSIS — M8440XA Pathological fracture, unspecified site, initial encounter for fracture: Secondary | ICD-10-CM

## 2020-09-17 DIAGNOSIS — R9389 Abnormal findings on diagnostic imaging of other specified body structures: Secondary | ICD-10-CM

## 2020-09-17 DIAGNOSIS — M84451D Pathological fracture, right femur, subsequent encounter for fracture with routine healing: Secondary | ICD-10-CM

## 2020-09-17 LAB — CBC
HCT: 32.6 % — ABNORMAL LOW (ref 36.0–46.0)
Hemoglobin: 10.7 g/dL — ABNORMAL LOW (ref 12.0–15.0)
MCH: 28.4 pg (ref 26.0–34.0)
MCHC: 32.8 g/dL (ref 30.0–36.0)
MCV: 86.5 fL (ref 80.0–100.0)
Platelets: 269 10*3/uL (ref 150–400)
RBC: 3.77 MIL/uL — ABNORMAL LOW (ref 3.87–5.11)
RDW: 13.7 % (ref 11.5–15.5)
WBC: 10.6 10*3/uL — ABNORMAL HIGH (ref 4.0–10.5)
nRBC: 0 % (ref 0.0–0.2)

## 2020-09-17 LAB — BASIC METABOLIC PANEL
Anion gap: 8 (ref 5–15)
BUN: 15 mg/dL (ref 6–20)
CO2: 24 mmol/L (ref 22–32)
Calcium: 8.4 mg/dL — ABNORMAL LOW (ref 8.9–10.3)
Chloride: 107 mmol/L (ref 98–111)
Creatinine, Ser: 0.6 mg/dL (ref 0.44–1.00)
GFR, Estimated: >60 mL/min (ref >60)
Glucose, Bld: 96 mg/dL (ref 70–99)
Potassium: 3.7 mmol/L (ref 3.5–5.1)
Sodium: 139 mmol/L (ref 135–145)

## 2020-09-17 MED ORDER — MAGNESIUM HYDROXIDE 400 MG/5ML PO SUSP
15.0000 mL | Freq: Every day | ORAL | Status: DC
  Administered 2020-09-17: 15 mL via ORAL
  Filled 2020-09-17: qty 30

## 2020-09-17 MED ORDER — ACETAMINOPHEN 325 MG PO TABS: 325.0000 mg | ORAL_TABLET | Freq: Four times a day (QID) | ORAL | Status: DC | PRN

## 2020-09-17 MED ORDER — OXYCODONE HCL 5 MG PO TABS: 5.0000 mg | ORAL_TABLET | ORAL | 0 refills | Status: DC | PRN

## 2020-09-17 MED ORDER — ENOXAPARIN SODIUM 40 MG/0.4ML ~~LOC~~ SOLN: 40.0000 mg | SUBCUTANEOUS | 0 refills | Status: DC

## 2020-09-17 MED ORDER — POLYETHYLENE GLYCOL 3350 17 G PO PACK: 17.0000 g | PACK | Freq: Every day | ORAL | 0 refills | Status: DC | PRN

## 2020-09-17 NOTE — Discharge Instructions (Signed)
Diet: As you were doing prior to hospitalization   Shower:  May shower but keep the wounds dry, use an occlusive plastic wrap, NO SOAKING IN TUB.  If the bandage gets wet, change with a clean dry gauze.  Dressing:  Leave dressing in place until first post-op appointment.   Activity:  Increase activity slowly as tolerated, but follow the weight bearing instructions below.  No lifting or driving for 6 weeks.  Weight Bearing:   Partial weightbearing to the right leg until first follow-up appointment in 10-14 days.  To prevent constipation: you may use a stool softener such as -  Colace (over the counter) 100 mg by mouth twice a day  Drink plenty of fluids (prune juice may be helpful) and high fiber foods Miralax (over the counter) for constipation as needed.    Itching:  If you experience itching with your medications, try taking only a single pain pill, or even half a pain pill at a time.  You may take up to 10 pain pills per day, and you can also use benadryl over the counter for itching or also to help with sleep.   Precautions:  If you experience chest pain or shortness of breath - call 911 immediately for transfer to the hospital emergency department!!  If you develop a fever greater that 101 F, purulent drainage from wound, increased redness or drainage from wound, or calf pain-Call [facility]                                              Follow- Up Appointment:  Please call for an appointment to be seen in 2 weeks at [facility]

## 2020-09-17 NOTE — Progress Notes (Signed)
Patient is stable and ready for discharge going home. Patient had no IV access. Patient's belongings taken by husband and she went home with RW. Patient was dressed by NT and NT transported to car with husband. Writer went over discharge information with patient and husband and both verbalized understanding and had no questions. Patient's hard script prescriptions in patient's discharge packet.

## 2020-09-17 NOTE — Progress Notes (Signed)
Physical Therapy Treatment Patient Details Name: Gina Sanchez MRN: [id number] DOB: 16-Jan-1966 Today's Date: 09/17/2020    History of Present Illness 55 y.o. female with medical history significant for hypertension, hyperlipidemia, generalized anxiety disorder who is admitted to [facility] on 09/14/2020 with an acute right proximal femoral shaft fracture.  Had been having thigh pain for a few weeks, now s/p nailing 09/15/20.    PT Comments    Pt eager to work with PT but does continues to have some anxiety and c/o pain with most activity.  She was again able to walk to/from PT gym and negotiate steps (less cuing today).  She did have HR 100-130 during ambulation but did not have any excessive fatigue or shortness of breath.  Pt showed good effort, but is still self limited with pain and guarded with a lot of quad engagement exercises.     Follow Up Recommendations  Follow surgeon's recommendation for DC plan and follow-up therapies     Equipment Recommendations  Rolling walker with 5" wheels    Recommendations for Other Services       Precautions / Restrictions Precautions Precautions: Fall Restrictions Weight Bearing Restrictions: Yes RLE Weight Bearing: Partial weight bearing RLE Partial Weight Bearing Percentage or Pounds: 50    Mobility  Bed Mobility Overal bed mobility: Modified Independent             General bed mobility comments: in recliner on arrival and post session  Transfers Overall transfer level: Modified independent Equipment used: Rolling walker (2 wheeled)             General transfer comment: still needing some minimal cuing for hand placement and sequencing, but pt able to rise w/o direct assist  Ambulation/Gait Ambulation/Gait assistance: Modified independent (Device/Increase time) Gait Distance (Feet): 200 Feet Assistive device: Rolling walker (2 wheeled)       General Gait Details: Pt continues to good tolerance  with prolonged ambulation.  She did appear to put considerably less than 50% body weight through R.  HR was 100-130 t/o the effort   Stairs Stairs: Yes Stairs assistance: Supervision Stair Management: One rail Right;Sideways Number of Stairs: 4 General stair comments: Very minimal cuing, pt able to negotiate up/down with expected UE use. Despite doing well she does remain somewhat hesitant and cautious.   Wheelchair Mobility    Modified Rankin (Stroke Patients Only)       Balance Overall balance assessment: Modified Independent                                          Cognition Arousal/Alertness: Awake/alert Behavior During Therapy: WFL for tasks assessed/performed;Anxious Overall Cognitive Status: Within Functional Limits for tasks assessed                                        Exercises General Exercises - Lower Extremity Quad Sets: Strengthening;10 reps Short Arc Quad: Strengthening;10 reps Long Arc Quad: Strengthening;10 reps Heel Slides: Strengthening;10 reps Hip ABduction/ADduction: Strengthening;10 reps    General Comments        Pertinent Vitals/Pain Pain Score: 5     Home Living                      Prior Function  PT Goals (current goals can now be found in the care plan section) Progress towards PT goals: Progressing toward goals    Frequency    BID      PT Plan Current plan remains appropriate    Co-evaluation              AM-PAC PT "6 Clicks" Mobility   Outcome Measure  Help needed turning from your back to your side while in a flat bed without using bedrails?: A Little Help needed moving from lying on your back to sitting on the side of a flat bed without using bedrails?: A Little Help needed moving to and from a bed to a chair (including a wheelchair)?: A Little Help needed standing up from a chair using your arms (e.g., wheelchair or bedside chair)?: A Little Help needed  to walk in hospital room?: A Little Help needed climbing 3-5 steps with a railing? : A Lot 6 Click Score: 17    End of Session Equipment Utilized During Treatment: Gait belt Activity Tolerance: Patient tolerated treatment well Patient left: with call bell/phone within reach;in chair   PT Visit Diagnosis: Muscle weakness (generalized) (M62.81);Difficulty in walking, not elsewhere classified (R26.2)     Time: 1115-5208 PT Time Calculation (min) (ACUTE ONLY): 57 min  Charges:  $Gait Training: 23-37 mins $Therapeutic Exercise: 23-37 mins                     Kreg Shropshire, DPT 09/17/2020, 12:56 PM

## 2020-09-17 NOTE — Discharge Summary (Signed)
[facility] [place] NAME: Gina Sanchez    MR#:  [id number]  DATE OF BIRTH:  1966-09-02  DATE OF ADMISSION:  09/14/2020 ADMITTING PHYSICIAN: Rhetta Mura, DO  DATE OF DISCHARGE: 09/17/2020  1:28 PM  PRIMARY CARE PHYSICIAN: Remi Haggard, FNP    ADMISSION DIAGNOSIS:  Pain [R52] Fall [W19.XXXA] Pathologic fracture [M84.40XA] Closed fracture of right femur, unspecified fracture morphology, initial encounter ([place]) [S72.91XA]  DISCHARGE DIAGNOSIS:  Principal Problem:   Right femoral shaft fracture (HCC) Active Problems:   Essential hypertension   Hypercholesteremia   Right hip pain   Leukocytosis   GAD (generalized anxiety disorder)   Mediastinal adenopathy   SECONDARY DIAGNOSIS:   Past Medical History:  Diagnosis Date  . Abnormal Pap smear of cervix   . Anxiety   . Depression   . Essential hypertension   . Palpitations   . Precordial chest pain     HOSPITAL COURSE:   1.  Right femoral shaft fracture requiring operative repair.  Patient had a reduction and internal fixation of the close displaced pathological right femoral fracture by Dr. Roland Rack on 09/15/2020.  Awaiting bone biopsy report.  Orthopedic surgery prescribed Lovenox and pain medications upon going home.  Conversion to Tylenol as quickly as possible.  Home health PT and OT set up with a rolling walker.  Patient did well with physical therapy today and wanted to go home. 2.  Right femoral bone pathologic fracture.  Awaiting biopsy report.  Patient also had a CT scan of the chest abdomen pelvis that showed mediastinal adenopathy, 4.5 mm right middle lobe nodule, 1 cm right thyroid nodule, indeterminate 2.8 cm left adrenal lesion.  2 small areas of subcapsular left hepatic lobe low density which could reflect focal fat or lesions.  Patient will follow up with Dr. Janese Banks oncology as outpatient and have a outpatient PET CT scan.  The patient already has had biopsies in the  past about her thyroid nodules. 3.  Essential hypertension on Toprol-XL 4.  Hyperlipidemia unspecified can go back on Zetia as outpatient 5.  Anxiety on Essig needed Xanax  DISCHARGE CONDITIONS:   Satisfactory  CONSULTS OBTAINED:  Treatment Team:  Poggi, Marshall Cork, MD Sindy Guadeloupe, MD  DRUG ALLERGIES:   Allergies  Allergen Reactions  . Factive [Gemifloxacin] Rash    DISCHARGE MEDICATIONS:   Allergies as of 09/17/2020      Reactions   Factive [gemifloxacin] Rash      Medication List    TAKE these medications   acetaminophen 325 MG tablet Commonly known as: TYLENOL Take 1-2 tablets (325-650 mg total) by mouth every 6 (six) hours as needed for mild pain (pain score 1-3 or temp > 100.5).   ALPRAZolam 0.5 MG tablet Commonly known as: XANAX Take 0.5 mg by mouth 2 (two) times daily as needed for anxiety.   enoxaparin 40 MG/0.4ML injection Commonly known as: LOVENOX Inject 0.4 mLs (40 mg total) into the skin daily.   ezetimibe 10 MG tablet Commonly known as: ZETIA Take 10 mg by mouth daily.   ibuprofen 400 MG tablet Commonly known as: ADVIL Take 400 mg by mouth every 4 (four) hours as needed.   metoprolol succinate 25 MG 24 hr tablet Commonly known as: TOPROL-XL Take 25 mg by mouth daily.   oxyCODONE 5 MG immediate release tablet Commonly known as: Oxy IR/ROXICODONE Take 1-2 tablets (5-10 mg total) by mouth every 4 (four) hours as needed for moderate pain.  polyethylene glycol 17 g packet Commonly known as: MiraLax Take 17 g by mouth daily as needed for moderate constipation.            Durable Medical Equipment  (From admission, onward)         Start     Ordered   09/17/20 1107  For home use only DME Walker  Once       Question:  Patient needs a walker to treat with the following condition  Answer:  Hip fracture ([place])   09/17/20 1106   09/17/20 1045  For home use only DME Walker rolling  Once       Question Answer Comment  Walker: With 5 Inch  Wheels   Patient needs a walker to treat with the following condition Weakness      09/17/20 1044           DISCHARGE INSTRUCTIONS:   Follow-up PMD 1 week Follow-up Dr. Janese Banks 5 days Follow-up orthopedic surgery 2 weeks  If you experience worsening of your admission symptoms, develop shortness of breath, life threatening emergency, suicidal or homicidal thoughts you must seek medical attention immediately by calling 911 or calling your MD immediately  if symptoms less severe.  You Must read complete instructions/literature along with all the possible adverse reactions/side effects for all the Medicines you take and that have been prescribed to you. Take any new Medicines after you have completely understood and accept all the possible adverse reactions/side effects.   Please note  You were cared for by a hospitalist during your hospital stay. If you have any questions about your discharge medications or the care you received while you were in the hospital after you are discharged, you can call the unit and asked to speak with the hospitalist on call if the hospitalist that took care of you is not available. Once you are discharged, your primary care physician will handle any further medical issues. Please note that NO REFILLS for any discharge medications will be authorized once you are discharged, as it is imperative that you return to your primary care physician (or establish a relationship with a primary care physician if you do not have one) for your aftercare needs so that they can reassess your need for medications and monitor your lab values.    Today   CHIEF COMPLAINT:   Chief Complaint  Patient presents with  . Leg Pain    HISTORY OF PRESENT ILLNESS:  Gina Sanchez  is a 55 y.o. female came in with right leg pain   VITAL SIGNS:  Blood pressure 110/65, pulse 80, temperature 98.3 F (36.8 C), temperature source Oral, resp. rate 15, height 5' 7"  (1.702 m), weight 88.6 kg,  last menstrual period 09/15/2018, SpO2 99 %.  I/O:    Intake/Output Summary (Last 24 hours) at 09/17/2020 1547 Last data filed at 09/17/2020 0433 Gross per 24 hour  Intake 120 ml  Output 0 ml  Net 120 ml    PHYSICAL EXAMINATION:  GENERAL:  55 y.o.-year-old patient lying in the bed with no acute distress.  EYES: Pupils equal, round, reactive to light and accommodation. No scleral icterus. HEENT: Head atraumatic, normocephalic. Oropharynx and nasopharynx clear.  LUNGS: Normal breath sounds bilaterally, no wheezing, rales,rhonchi or crepitation. No use of accessory muscles of respiration.  CARDIOVASCULAR: S1, S2 normal. No murmurs, rubs, or gallops.  ABDOMEN: Soft, non-tender, non-distended. Bowel sounds present. No organomegaly or mass.  EXTREMITIES: No pedal edema, cyanosis, or clubbing.  NEUROLOGIC: Cranial nerves  II through XII are intact. Muscle strength 5/5 in all extremities. Sensation intact. Gait not checked.  PSYCHIATRIC: The patient is alert and oriented x 3.  SKIN: No obvious rash, lesion, or ulcer.   DATA REVIEW:   CBC Recent Labs  Lab 09/17/20 0444  WBC 10.6*  HGB 10.7*  HCT 32.6*  PLT 269    Chemistries  Recent Labs  Lab 09/14/20 1914 09/15/20 0322 09/15/20 1819 09/17/20 0444  NA 137 137   < > 139  K 3.7 3.5   < > 3.7  CL 103 104   < > 107  CO2 24 24   < > 24  GLUCOSE 118* 105*   < > 96  BUN 15 17   < > 15  CREATININE 0.63 0.63   < > 0.60  CALCIUM 9.1 8.7*   < > 8.4*  MG  --  2.1  --   --   AST 16  --   --   --   ALT 16  --   --   --   ALKPHOS 84  --   --   --   BILITOT 1.0  --   --   --    < > = values in this interval not displayed.    Microbiology Results  Results for orders placed or performed during the hospital encounter of 09/14/20  Resp Panel by RT-PCR (Flu A&B, Covid) Nasopharyngeal Swab     Status: None   Collection Time: 09/14/20  7:20 PM   Specimen: Nasopharyngeal Swab; Nasopharyngeal(NP) swabs in vial transport medium  Result Value  Ref Range Status   SARS Coronavirus 2 by RT PCR NEGATIVE NEGATIVE Final    Comment: (NOTE) SARS-CoV-2 target nucleic acids are NOT DETECTED.  The SARS-CoV-2 RNA is generally detectable in upper respiratory specimens during the acute phase of infection. The lowest concentration of SARS-CoV-2 viral copies this assay can detect is 138 copies/mL. A negative result does not preclude SARS-Cov-2 infection and should not be used as the sole basis for treatment or other patient management decisions. A negative result may occur with  improper specimen collection/handling, submission of specimen other than nasopharyngeal swab, presence of viral mutation(s) within the areas targeted by this assay, and inadequate number of viral copies(<138 copies/mL). A negative result must be combined with clinical observations, patient history, and epidemiological information. The expected result is Negative.  Fact Sheet for Patients:  EntrepreneurPulse.com.au  Fact Sheet for Healthcare Providers:  IncredibleEmployment.be  This test is no t yet approved or cleared by the [place] FDA and  has been authorized for detection and/or diagnosis of SARS-CoV-2 by FDA under an Emergency Use Authorization (EUA). This EUA will remain  in effect (meaning this test can be used) for the duration of the COVID-19 declaration under Section 564(b)(1) of the Act, 21 U.S.C.section 360bbb-3(b)(1), unless the authorization is terminated  or revoked sooner.       Influenza A by PCR NEGATIVE NEGATIVE Final   Influenza B by PCR NEGATIVE NEGATIVE Final    Comment: (NOTE) The Xpert Xpress SARS-CoV-2/FLU/RSV plus assay is intended as an aid in the diagnosis of influenza from Nasopharyngeal swab specimens and should not be used as a sole basis for treatment. Nasal washings and aspirates are unacceptable for Xpert Xpress SARS-CoV-2/FLU/RSV testing.  Fact Sheet for  Patients: EntrepreneurPulse.com.au  Fact Sheet for Healthcare Providers: IncredibleEmployment.be  This test is not yet approved or cleared by the [place] and has been authorized for  detection and/or diagnosis of SARS-CoV-2 by FDA under an Emergency Use Authorization (EUA). This EUA will remain in effect (meaning this test can be used) for the duration of the COVID-19 declaration under Section 564(b)(1) of the Act, 21 U.S.C. section 360bbb-3(b)(1), unless the authorization is terminated or revoked.  Performed at [facility], [address] [place], [place] [address]     RADIOLOGY:  [place] HIP OPERATIVE UNILAT W OR W/O PELVIS RIGHT  Result Date: 09/15/2020 CLINICAL DATA:  Known pathologic right femoral fracture EXAM: OPERATIVE RIGHT HIP WITH PELVIS COMPARISON:  09/14/2020 FLUOROSCOPY TIME:  Radiation Exposure Index (as provided by the fluoroscopic device): 9.24 mGy If the device does not provide the exposure index: Fluoroscopy Time:  56 seconds Number of Acquired Images:  4 FINDINGS: Medullary rod is noted within the right femur with proximal and distal fixation screws. The previously seen fracture is well aligned. A permeative pattern is noted within the underlying bone stable from the prior exam. IMPRESSION: ORIF of right femoral fracture. Electronically Signed   By: Inez Catalina M.D.   On: 09/15/2020 16:41     Management plans discussed with the patient, family and they are in agreement.  CODE STATUS:     Code Status Orders  (From admission, onward)         Start     Ordered   09/14/20 2242  Full code  Continuous        09/14/20 2243        Code Status History    Date Active Date Inactive Code Status Order ID Comments User Context   03/25/2017 2322 03/29/2017 1421 Full Code [id number]  Consuelo Pandy, PA-C Inpatient   Advance Care Planning Activity      TOTAL TIME TAKING CARE OF THIS PATIENT: 35 minutes.     Loletha Grayer M.D on 09/17/2020 at 3:47 PM  Between 7am to 6pm - Pager - [phone]  After 6pm go to [email or web address] - password [email or web address]  [facility]  CC: Primary care physician; Remi Haggard, FNP

## 2020-09-17 NOTE — TOC Initial Note (Signed)
Transition of Care [facility]) - Initial/Assessment Note    Patient Details  Name: Gina Sanchez MRN: [id number] Date of Birth: 1966-05-31  Transition of Care [facility]) CM/SW Contact:    Shelbie Ammons, RN Phone Number: 09/17/2020, 10:52 AM  Clinical Narrative:    RNCM met with patient in room. Patient sitting in recliner reports to feeling well today having just worked with PT. Discussed discharge planning. Patient reports it will be her plan to go home when she discharges and is interested in outpatient PT. Patient is also agreeable to rolling walker which was ordered through [employer].  RNCM reached out to [facility] per request of patient, they will just need referral faxed to them at [phone] when patient is ready for discharge.               Expected Discharge Plan: OP Rehab Barriers to Discharge: No Barriers Identified   Patient Goals and CMS Choice        Expected Discharge Plan and Services Expected Discharge Plan: OP Rehab       Living arrangements for the past 2 months: Single Family Home                 DME Arranged: Walker rolling DME Agency: [employer] Date DME Agency Contacted: 09/17/20 Time DME Agency Contacted: 57 Representative spoke with at DME Agency: Mardene Celeste            Prior Living Arrangements/Services Living arrangements for the past 2 months: [place] Lives with:: Significant Other Patient language and need for interpreter reviewed:: Yes Do you feel safe going back to the place where you live?: Yes      Need for Family Participation in Patient Care: Yes (Comment) Care giver support system in place?: Yes (comment)   Criminal Activity/Legal Involvement Pertinent to Current Situation/Hospitalization: No - Comment as needed  Activities of Daily Living Home Assistive Devices/Equipment: None ADL Screening (condition at time of admission) Patient's cognitive ability adequate to safely complete daily activities?: Yes Is the  patient deaf or have difficulty hearing?: No Does the patient have difficulty seeing, even when wearing glasses/contacts?: No Does the patient have difficulty concentrating, remembering, or making decisions?: No Patient able to express need for assistance with ADLs?: Yes Does the patient have difficulty dressing or bathing?: No Independently performs ADLs?: Yes (appropriate for developmental age) Does the patient have difficulty walking or climbing stairs?: No Weakness of Legs: None Weakness of Arms/Hands: None  Permission Sought/Granted                  Emotional Assessment Appearance:: Appears stated age   Affect (typically observed): Appropriate Orientation: : Oriented to Self,Oriented to Place,Oriented to  Time,Oriented to Situation Alcohol / Substance Use: Not Applicable Psych Involvement: No (comment)  Admission diagnosis:  Pain [R52] Fall [W19.XXXA] Pathologic fracture [M84.40XA] Closed fracture of right femur, unspecified fracture morphology, initial encounter [facility]) [S72.91XA] Patient Active Problem List   Diagnosis Date Noted  . Mediastinal adenopathy   . Closed fracture of right femur, unspecified fracture morphology, initial encounter ([place]) 09/14/2020  . Right femoral shaft fracture ([place]) 09/14/2020  . Right hip pain 09/14/2020  . Leukocytosis 09/14/2020  . GAD (generalized anxiety disorder) 09/14/2020  . Tobacco use 03/29/2017  . Unstable angina ([place]) 03/25/2017  . Essential hypertension   . Hypercholesteremia   . Precordial chest pain   . Palpitations    PCP:  Remi Haggard, FNP Pharmacy:   [place], [place] -  [address] [address] Phone: [phone] Fax: [place], [place] - [address] [place] [address] Phone: [phone] Fax: [phone]  [place], [place] - [address] [place] [address] Phone: [phone] Fax: [phone]     Social Determinants of Health (SDOH) Interventions    Readmission Risk Interventions No flowsheet data found.

## 2020-09-17 NOTE — Progress Notes (Signed)
  Subjective: 2 Days Post-Op Procedure(s) (LRB): INTRAMEDULLARY (IM) NAIL INTERTROCHANTRIC (Right) Patient reports pain as mild this AM.   Patient is well, and has had no acute complaints or problems Plan is to go Home after hospital stay. Negative for chest pain and shortness of breath Fever: no Gastrointestinal:Negative for nausea and vomiting. Patient is passing gas without pain.  WIll add milk of magnesia today.  Objective: Vital signs in last 24 hours: Temp:  [98.1 F (36.7 C)-98.7 F (37.1 C)] 98.2 F (36.8 C) (01/05 0433) Pulse Rate:  [76-101] 79 (01/05 0433) Resp:  [16] 16 (01/05 0433) BP: (109-139)/(71-84) 118/78 (01/05 0433) SpO2:  [98 %-100 %] 98 % (01/05 0433)  Intake/Output from previous day:  Intake/Output Summary (Last 24 hours) at 09/17/2020 0755 Last data filed at 09/17/2020 0433 Gross per 24 hour  Intake 664.02 ml  Output 0 ml  Net 664.02 ml    Intake/Output this shift: No intake/output data recorded.  Labs: Recent Labs    09/14/20 1914 09/15/20 0322 09/16/20 0530 09/17/20 0444  HGB 13.7 13.2 12.3 10.7*   Recent Labs    09/16/20 0530 09/17/20 0444  WBC 13.1* 10.6*  RBC 4.30 3.77*  HCT 37.2 32.6*  PLT 289 269   Recent Labs    09/16/20 0530 09/17/20 0444  NA 138 139  K 4.2 3.7  CL 106 107  CO2 23 24  BUN 16 15  CREATININE 0.61 0.60  GLUCOSE 141* 96  CALCIUM 8.6* 8.4*   Recent Labs    09/14/20 1914  INR 1.1     EXAM General - Patient is Alert, Appropriate and Oriented Extremity - ABD soft Sensation intact distally Intact pulses distally Dorsiflexion/Plantar flexion intact Incision: dressing C/D/I No cellulitis present Dressing/Incision - clean, dry, no drainage Motor Function - intact, moving foot and toes well on exam.  Abdomen soft with normal bowel sounds.  Past Medical History:  Diagnosis Date  . Abnormal Pap smear of cervix   . Anxiety   . Depression   . Essential hypertension   . Palpitations   . Precordial  chest pain     Assessment/Plan: 2 Days Post-Op Procedure(s) (LRB): INTRAMEDULLARY (IM) NAIL INTERTROCHANTRIC (Right) Principal Problem:   Right femoral shaft fracture (HCC) Active Problems:   Essential hypertension   Hypercholesteremia   Right hip pain   Leukocytosis   GAD (generalized anxiety disorder)   Mediastinal adenopathy  Estimated body mass index is 30.6 kg/m as calculated from the following:   Height as of this encounter: 5' 7"  (1.702 m).   Weight as of this encounter: 88.6 kg. Up with therapy  Labs reviewed this AM, WBC trending down. Oncology consulted on patient, plan is for outpatient PET scan. Patient is passing gas today.  Will add MoM today. Continue with PT today, was able to walk 250 feet yesterday. Surgical pathology has not returned yet.  DVT Prophylaxis - Lovenox and SCDs Partial weightbearing to the right leg  J. Cameron Proud, PA-C [facility] Orthopaedic Surgery 09/17/2020, 7:55 AM

## 2020-09-26 ENCOUNTER — Other Ambulatory Visit: Payer: Self-pay

## 2020-09-26 ENCOUNTER — Inpatient Hospital Stay (HOSPITAL_BASED_OUTPATIENT_CLINIC_OR_DEPARTMENT_OTHER): Payer: 59 | Admitting: Oncology

## 2020-09-26 ENCOUNTER — Other Ambulatory Visit: Payer: Self-pay | Admitting: *Deleted

## 2020-09-26 ENCOUNTER — Other Ambulatory Visit: Payer: Self-pay | Admitting: Anatomic Pathology & Clinical Pathology

## 2020-09-26 ENCOUNTER — Encounter: Payer: Self-pay | Admitting: Oncology

## 2020-09-26 VITALS — BP 108/78 | HR 79 | Temp 98.4°F | Resp 16 | Ht 67.0 in | Wt 188.0 lb

## 2020-09-26 DIAGNOSIS — C7951 Secondary malignant neoplasm of bone: Secondary | ICD-10-CM

## 2020-09-26 DIAGNOSIS — Z510 Encounter for antineoplastic radiation therapy: Secondary | ICD-10-CM | POA: Diagnosis not present

## 2020-09-26 DIAGNOSIS — F17210 Nicotine dependence, cigarettes, uncomplicated: Secondary | ICD-10-CM | POA: Diagnosis not present

## 2020-09-26 DIAGNOSIS — Z7189 Other specified counseling: Secondary | ICD-10-CM | POA: Diagnosis not present

## 2020-09-26 DIAGNOSIS — C801 Malignant (primary) neoplasm, unspecified: Secondary | ICD-10-CM

## 2020-09-26 DIAGNOSIS — F419 Anxiety disorder, unspecified: Secondary | ICD-10-CM | POA: Insufficient documentation

## 2020-09-26 LAB — SURGICAL PATHOLOGY

## 2020-09-26 MED ORDER — LORAZEPAM 0.5 MG PO TABS: 0.5000 mg | ORAL_TABLET | Freq: Four times a day (QID) | ORAL | 0 refills | Status: DC | PRN

## 2020-09-26 NOTE — Progress Notes (Signed)
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  She underwent operative fixation on 10/12/2020.  MRI of femur showed heterogeneously enhancing osseous lesion within the area which would be nonspecific versus office neoplasm or metastatic lesion.  CT chest abdomen and pelvis with contrast showed an enlarged pretracheal lymph node 2.2 x 1.5 cm and a right paratracheal lymph node measuring 2.7 x 1.3 cm.  Prevascular node measuring 0.3 x 0.9 cm.  5 x 4 mm right middle lobe nodule.  2.8 x 1.9 cm left adrenal lesion.  Patient smokes about 1 pack of cigarettes every 3 days.  Reports that her appetite and weight have remained stable.  She does not have significant medical problems.  She has been up-to-date with her mammograms.   Reamings from the right femur showed metastatic poorly differentiated carcinoma.  Immunohistochemistry showed was positive for pancytokeratin, CK7 and patchy CK20 with patchy dim expression of TTF-1.  Cells negative for Melan-A, CDX2, PAX8, Napsin A, GATA3, p40, CD56, p16 and thyroglobulin.  Findings compatible with metastatic carcinoma but because of decalcification immunohistochemical staining is unreliable.  Patchy dim staining with TTF-1 suspicious for lung primary but not a definitive  diagnosis.  Interval history-right hip pain is getting better and she is working with home physical therapy.  She is not using as much oxycodone.  He reports ongoing anxiety.  Appetite and weight have remained stable.  ECOG PS- 1 Pain scale- 2 Opioid associated constipation- no  Review of systems- Review of Systems  Constitutional: Negative for chills, fever, malaise/fatigue and weight loss.  HENT: Negative for congestion, ear discharge and nosebleeds.   Eyes: Negative for blurred vision.  Respiratory: Negative for cough, hemoptysis, sputum production, shortness of breath and wheezing.   Cardiovascular: Negative for chest pain, palpitations, orthopnea and claudication.  Gastrointestinal: Negative for abdominal pain, blood in stool, constipation, diarrhea, heartburn, melena, nausea and vomiting.  Genitourinary: Negative for dysuria, flank pain, frequency, hematuria and urgency.  Musculoskeletal: Negative for back pain, joint pain and myalgias.       Right hip pain  Skin: Negative for rash.  Neurological: Negative for dizziness, tingling, focal weakness, seizures, weakness and headaches.  Endo/Heme/Allergies: Does not bruise/bleed easily.  Psychiatric/Behavioral: Negative for depression and suicidal ideas. The patient does not have insomnia.      Allergies  Allergen Reactions  . Factive [Gemifloxacin] Rash     Past Medical History:  Diagnosis Date  . Abnormal Pap smear of cervix   . Anxiety   . Depression   . Essential hypertension   . Femur fracture, right ([place])   . Palpitations   . Precordial chest pain      Past Surgical History:  Procedure Laterality Date  . BREAST BIOPSY Right 2017   benign  . CERVICAL BIOPSY  W/ LOOP ELECTRODE EXCISION    . COLONOSCOPY WITH PROPOFOL N/A 07/03/2015   Procedure: COLONOSCOPY WITH PROPOFOL;  Surgeon: Hulen Luster, MD;  Location: [facility] ENDOSCOPY;  Service: Gastroenterology;  Laterality: N/A;  . ESOPHAGOGASTRODUODENOSCOPY (EGD) WITH  PROPOFOL N/A 07/03/2015   Procedure: ESOPHAGOGASTRODUODENOSCOPY (EGD) WITH PROPOFOL;  Surgeon: Hulen Luster, MD;  Location: [facility] ENDOSCOPY;  Service: Gastroenterology;  Laterality: N/A;  . INTRAMEDULLARY (IM) NAIL INTERTROCHANTERIC Right 09/15/2020   Procedure: INTRAMEDULLARY (IM) NAIL INTERTROCHANTRIC;  Surgeon: Corky Mull, MD;  Location: [facility] ORS;  Service: Orthopedics;  Laterality: Right;  . LEFT HEART CATH AND CORONARY ANGIOGRAPHY N/A 03/28/2017   Procedure: Left Heart Cath and Coronary Angiography;  Surgeon: Lorretta Harp, MD;  Location: [place] [facility] LAB;  Service: Cardiovascular;  Laterality: N/A;    Social History   Socioeconomic History  . Marital status: Married    Spouse name: Not on file  . Number of children: Not on file  . Years of education: Not on file  . Highest education level: Not on file  Occupational History  . Not on file  Tobacco Use  . Smoking status: Former Smoker    Packs/day: 1.00    Years: 30.00    Pack years: 30.00    Quit date: 09/17/2020    Years since quitting: 0.0  . Smokeless tobacco: Never Used  Vaping Use  . Vaping Use: Never used  Substance and Sexual Activity  . Alcohol use: Not Currently    Alcohol/week: 0.0 standard drinks  . Drug use: No  . Sexual activity: Yes    Birth control/protection: I.U.D., Post-menopausal  Other Topics Concern  . Not on file  Social History Narrative   Lives in [place] with her husband.  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   Social Determinants of Health   Financial Resource Strain: Not on file  Food Insecurity: Not on file  Transportation Needs: Not on file  Physical Activity: Not on file  Stress: Not on file  Social Connections: Not on file  Intimate Partner Violence: Not on file    Family History  Problem Relation Age of Onset  . Diabetes Mother        alive @ 75  . Goiter Mother   . Heart disease Father        died of MI @ 47  . Osteoporosis  Maternal Grandmother   . Colon cancer Maternal Grandfather   . Breast cancer Neg Hx   . Ovarian cancer Neg Hx      Current Outpatient Medications:  .  acetaminophen (TYLENOL) 325 MG tablet, Take 1-2 tablets (325-650 mg total) by mouth every 6 (six) hours as needed for mild pain (pain score 1-3 or temp > 100.5)., Disp: , Rfl:  .  enoxaparin (LOVENOX) 40 MG/0.4ML injection, Inject 0.4 mLs (40 mg total) into the skin daily., Disp: 5.6 mL, Rfl: 0 .  ibuprofen (ADVIL) 400 MG tablet, Take 400 mg by mouth every 4 (four) hours as needed., Disp: , Rfl:  .  LORazepam (ATIVAN) 0.5 MG tablet, Take 1 tablet (0.5 mg total) by mouth every 6 (six) hours as needed for anxiety., Disp: 60 tablet, Rfl: 0 .  metoprolol succinate (TOPROL-XL) 25 MG 24 hr tablet, Take 25 mg by mouth daily., Disp: , Rfl:  .  ondansetron (ZOFRAN) 4 MG tablet, Take 4 mg by mouth every 8 (eight) hours as needed for nausea or vomiting., Disp: , Rfl:  .  oxyCODONE (OXY IR/ROXICODONE) 5 MG immediate release tablet, Take 1-2 tablets (5-10 mg total) by mouth every 4 (four)  hours as needed for moderate pain., Disp: 50 tablet, Rfl: 0 .  polyethylene glycol (MIRALAX) 17 g packet, Take 17 g by mouth daily as needed for moderate constipation., Disp: 30 each, Rfl: 0  Physical exam:  Vitals:   09/26/20 1342  BP: 108/78  Pulse: 79  Resp: 16  Temp: 98.4 F (36.9 C)  TempSrc: Oral  Weight: 188 lb (85.3 kg)  Height: 5' 7"  (1.702 m)   Physical Exam Constitutional:      Comments: Patient is sitting in a wheelchair.  Appears in no acute distress  Eyes:     Extraocular Movements: EOM normal.  Cardiovascular:     Rate and Rhythm: Normal rate and regular rhythm.     Heart sounds: Normal heart sounds.  Pulmonary:     Effort: Pulmonary effort is normal.     Breath sounds: Normal breath sounds.  Abdominal:     General: Bowel sounds are normal.     Palpations: Abdomen is soft.  Skin:    General: Skin is warm and dry.  Neurological:      Mental Status: She is alert and oriented to person, place, and time.     Breast exam: No palpable bilateral breast masses.  No palpable bilateral axillary adenopathy    CMP Latest Ref Rng & Units 09/17/2020  Glucose 70 - 99 mg/dL 96  BUN 6 - 20 mg/dL 15  Creatinine 0.44 - 1.00 mg/dL 0.60  Sodium 135 - 145 mmol/L 139  Potassium 3.5 - 5.1 mmol/L 3.7  Chloride 98 - 111 mmol/L 107  CO2 22 - 32 mmol/L 24  Calcium 8.9 - 10.3 mg/dL 8.4(L)  Total Protein 6.5 - 8.1 g/dL -  Total Bilirubin 0.3 - 1.2 mg/dL -  Alkaline Phos 38 - 126 U/L -  AST 15 - 41 U/L -  ALT 0 - 44 U/L -   CBC Latest Ref Rng & Units 09/17/2020  WBC 4.0 - 10.5 K/uL 10.6(H)  Hemoglobin 12.0 - 15.0 g/dL 10.7(L)  Hematocrit 36.0 - 46.0 % 32.6(L)  Platelets 150 - 400 K/uL 269    No images are attached to the encounter.  DG Pelvis 1-2 Views  Result Date: 09/14/2020 CLINICAL DATA:  Thigh and leg pain EXAM: PELVIS - 1-2 VIEW COMPARISON:  None. FINDINGS: There is no evidence of pelvic fracture or diastasis. The sacroiliac joints are intact. The visualized deep pelvis is unremarkable. IMPRESSION: Negative. Electronically Signed   By: Prudencio Pair M.D.   On: 09/14/2020 19:54   MR FEMUR RIGHT W WO CONTRAST  Result Date: 09/15/2020 CLINICAL DATA:  Right leg and thigh pain EXAM: MRI OF THE RIGHT FEMUR WITHOUT AND WITH CONTRAST TECHNIQUE: Multiplanar, multisequence MR imaging of the right was performed both before and after administration of intravenous contrast. CONTRAST:  7 mL Gadavist COMPARISON:  None. FINDINGS: Bones/Joint/Cartilage There is a comminuted impacted laterally angulated fracture of the proximal femoral shaft. There is an underlying heterogeneously enhancing intramedullary T1 dark/T2 bright osseous lesion within this region. There does appear to be possible heterogeneous hematoma seen along the proximal anteromedial aspect of the femoral shaft. No other acute osseous abnormality is seen. A small hip joint effusion is seen.  Ligaments Suboptimally visualized Muscles and Tendons There is also surrounding non loculated fluid and muscular edema seen. The edema extends around the anterolateral aspect of the femur musculature and within the adductor musculature. The visualized portion of the tendons appear to be grossly intact. Soft tissues Diffuse subcutaneous and soft tissue edema seen  surrounding the antro lateral aspect of the proximal femur. IMPRESSION: Comminuted impacted laterally angulated proximal femoral shaft fracture. There does appear to be a heterogeneously enhancing osseous lesion within this area which is non-specific could be osseous neoplasm or metastatic lesion. Heterogeneously enhancing hematoma , seen along the medial aspect of the proximal femoral shaft. Diffuse muscular edema seen surrounding the proximal femur. Electronically Signed   By: Prudencio Pair M.D.   On: 09/15/2020 02:23   CT CHEST ABDOMEN PELVIS W CONTRAST  Result Date: 09/15/2020 CLINICAL DATA:  Possible pathologic femoral fracture, metastatic evaluation EXAM: CT CHEST, ABDOMEN, AND PELVIS WITH CONTRAST TECHNIQUE: Multidetector CT imaging of the chest, abdomen and pelvis was performed following the standard protocol during bolus administration of intravenous contrast. CONTRAST:  161m OMNIPAQUE IOHEXOL 300 MG/ML  SOLN COMPARISON:  None. FINDINGS: CT CHEST FINDINGS Cardiovascular: Normal heart size. No pericardial effusion. Thoracic aorta is normal in caliber. Mediastinum/Nodes: Mediastinal adenopathy is present. For example, enlarged pretracheal node measuring 2.2 x 1.5 cm; right paratracheal node measuring 2.7 x 1.3 cm. Prevascular node measuring 0.3 x 0.9 cm. There is a 1 cm nodule of the partially imaged right thyroid lobe. Esophagus is unremarkable. No hilar or axillary adenopathy. Lungs/Pleura: There is a 5 x 4 mm nodule the right middle lobe (series 4, image 83). Dependent atelectasis/scarring. No pleural effusion. Musculoskeletal: No chest wall  mass or suspicious bone lesion. CT ABDOMEN PELVIS FINDINGS Hepatobiliary: Stable small cyst at the dome of the liver. Two areas of subcapsular ill-defined low-attenuation in the left hepatic lobe measuring up to 1.8 cm (series 2, image 46). Gallbladder is unremarkable. No biliary dilatation. Pancreas: Unremarkable. Spleen: Unremarkable. Adrenals/Urinary Tract: Right adrenal is unremarkable. There is a 2.8 x 1.9 cm left adrenal lesion. Too small to characterize low-attenuation lesion of the upper pole of the right kidney. Small cyst of the lower pole of the left kidney. 3 mm nonobstructing calculus of the upper pole the right kidney. Ureters are unremarkable. There is mild layering hyperdensity in the bladder. Stomach/Bowel: Stomach is within normal limits. Bowel is normal in caliber. Distal colonic diverticulosis. Normal appendix. Vascular/Lymphatic: Aortic atherosclerosis. No enlarged lymph nodes. Reproductive: Uterus and bilateral adnexa are unremarkable. Other: No ascites.  Abdominal wall is unremarkable. Musculoskeletal: Comminuted and angulated proximal right femoral shaft fracture with adjacent hematoma as described previously. Transitional anatomy at the lumbosacral junction. IMPRESSION: Mediastinal adenopathy. 4.5 mm right middle lobe nodule. 1 cm right thyroid lobe nodule. Ultrasound is not recommended by current guidelines. (Ref: J Am Coll Radiol. 2015 Feb;12(2): 143-50). Indeterminate 2.8 cm left adrenal lesion. Two small areas of subcapsular left hepatic lobe low density, which could reflect focal fat or lesions. Layering mild hyperdensity within the bladder, which could reflect proteinaceous material or blood products. Electronically Signed   By: PMacy MisM.D.   On: 09/15/2020 09:42   DG HIP OPERATIVE UNILAT W OR W/O PELVIS RIGHT  Result Date: 09/15/2020 CLINICAL DATA:  Known pathologic right femoral fracture EXAM: OPERATIVE RIGHT HIP WITH PELVIS COMPARISON:  09/14/2020 FLUOROSCOPY TIME:   Radiation Exposure Index (as provided by the fluoroscopic device): 9.24 mGy If the device does not provide the exposure index: Fluoroscopy Time:  56 seconds Number of Acquired Images:  4 FINDINGS: Medullary rod is noted within the right femur with proximal and distal fixation screws. The previously seen fracture is well aligned. A permeative pattern is noted within the underlying bone stable from the prior exam. IMPRESSION: ORIF of right femoral fracture. Electronically Signed   By:  Inez Catalina M.D.   On: 09/15/2020 16:41   DG FEMUR, MIN 2 VIEWS RIGHT  Result Date: 09/14/2020 CLINICAL DATA:  Pain for 1 month EXAM: RIGHT FEMUR 2 VIEWS COMPARISON:  None. FINDINGS: There is a comminuted mildly angulated proximal right femoral shaft fracture. There is question of underlying mottled lesion seen within the midshaft of the femur. The femoral head is still well seated within the acetabulum. IMPRESSION: Comminuted mildly angulated proximal right femur fracture. There is question of an underlying mottled lesion. Would recommend nonemergent MRI with contrast for further evaluation. Electronically Signed   By: Prudencio Pair M.D.   On: 09/14/2020 19:53     Assessment and plan- Patient is a 55 y.o. female with findings of mediastinal adenopathy, subcentimeter right middle lobe lung lesion, left adrenal lesion and pathological right hip fracture here to discuss pathology results and further management  Pathology results are consistent with metastatic carcinoma but due to bone specimen it was decalcified and immunohistochemistry results were not reliable.  Lung primary is still high on the differential given that the tumor was patchy and dim positive for TTF-1.  She is scheduled for a PET CT scan next week.  I have also discussed her case with Dr. Lanney Gins to see if a bronchoscopy guided mediastinal biopsy would be possible for a second tissue sample for [facility] as well as NGS testing.  We will obtain MRI brain with and  without contrast to complete staging work-up.  Refer to radiation oncology for palliative radiation to the right hip.  Discussed with the patient that this is consistent with stage IV cancer that has metastasized to the bone likely from the lung.  Treatment will be palliative not curative.  Treatment options will be decided based on second biopsy specimen.  Anxiety: We will send a prescription for Ativan 0.5 mg every 6 hours as needed  Follow-up with me to be decided based on second biopsy   Visit Diagnosis 1. 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 Goals of care, counseling/discussion      Dr. Randa Evens, MD, MPH [facility] [phone] 09/26/2020 4:02 PM

## 2020-09-26 NOTE — Progress Notes (Signed)
Pt here fo follow up from being in hospital. She got a bx of her right thigh and will need to discuss the pathology. She has pain at times mostly when she moves. Sitting she does ok. Having some nausea and has zofran that helps. She has had  Neuropathy in fingers for years and thinks it could be carpal tunnel syndrome but has never done anything about it except nerve conduction study

## 2020-10-01 ENCOUNTER — Telehealth: Payer: Self-pay | Admitting: *Deleted

## 2020-10-01 ENCOUNTER — Other Ambulatory Visit: Payer: Self-pay

## 2020-10-01 ENCOUNTER — Ambulatory Visit
Admission: RE | Admit: 2020-10-01 | Discharge: 2020-10-01 | Disposition: A | Discharge status: Home / Self Care | Payer: 59 | Source: Ambulatory Visit | Attending: Oncology | Admitting: Oncology

## 2020-10-01 ENCOUNTER — Encounter: Payer: Self-pay | Admitting: *Deleted

## 2020-10-01 DIAGNOSIS — C7951 Secondary malignant neoplasm of bone: Secondary | ICD-10-CM | POA: Diagnosis present

## 2020-10-01 DIAGNOSIS — R9389 Abnormal findings on diagnostic imaging of other specified body structures: Secondary | ICD-10-CM | POA: Diagnosis present

## 2020-10-01 DIAGNOSIS — C801 Malignant (primary) neoplasm, unspecified: Secondary | ICD-10-CM | POA: Diagnosis present

## 2020-10-01 MED ORDER — GADOBUTROL 1 MMOL/ML IV SOLN
9.0000 mL | Freq: Once | INTRAVENOUS | Status: AC | PRN
  Administered 2020-10-01: 9 mL via INTRAVENOUS

## 2020-10-01 NOTE — Telephone Encounter (Signed)
  IMPRESSION: Two punctate foci of enhancement in the right frontal lobe (series 15, image 11 and series 14, images 31 and 34), which are concerning for metastases given the clinical history. No substantial edema or mass effect.  These results will be called to the ordering clinician or representative by the Radiologist Assistant, and communication documented in the PACS or [employer].   Electronically Signed By: Margaretha Sheffield MD On: 10/01/2020 12:08

## 2020-10-01 NOTE — Progress Notes (Signed)
  Oncology Nurse Navigator Documentation  Navigator Location: [facility]-Med Onc (10/01/20 0900) Referral Date to RadOnc/MedOnc: 09/26/20 (10/01/20 0900) )Navigator Encounter Type: Appt/Treatment Plan Review;Telephone (10/01/20 0900) Telephone: Lahoma Crocker Call (10/01/20 0900) Abnormal Finding Date: 09/15/20 (10/01/20 0900)                   Treatment Phase: Abnormal Labs;Abnormal Scans (10/01/20 0900) Barriers/Navigation Needs: Coordination of Care (10/01/20 0900)   Interventions: Coordination of Care;Referrals (10/01/20 0900) Referrals: Pulmonary (10/01/20 0900) Coordination of Care: Appts (10/01/20 0900)     referral received from Dr. Janese Banks for navigation services. Per Dr. Janese Banks, pt will need additional imaging with PET and brain MRI as well as referral to Dr. Lanney Gins for second biopsy to obtain definitive tissue diagnosis. Pt scheduled for imaging. Urgent referral sent to [facility] pulmonary to schedule pt to see Dr. Lanney Gins. Follow up call today to [facility] pulmonary to confirm referral received. Spoke with Zigmund Daniel who confirmed referral has been received. Nothing further needed at this time. Will follow up with patient at next clinic visit to introduce to navigator services and give contact info.              Time Spent with Patient: 30 (10/01/20 0900)

## 2020-10-01 NOTE — Telephone Encounter (Signed)
Called report. Report not in Epic yet  2 focal enhancements in brain possible mets

## 2020-10-02 ENCOUNTER — Other Ambulatory Visit: Payer: Self-pay | Admitting: *Deleted

## 2020-10-02 ENCOUNTER — Ambulatory Visit
Admission: RE | Admit: 2020-10-02 | Discharge: 2020-10-02 | Disposition: A | Discharge status: Home / Self Care | Payer: 59 | Source: Ambulatory Visit | Attending: Oncology | Admitting: Oncology

## 2020-10-02 DIAGNOSIS — R9389 Abnormal findings on diagnostic imaging of other specified body structures: Secondary | ICD-10-CM

## 2020-10-02 DIAGNOSIS — C7951 Secondary malignant neoplasm of bone: Secondary | ICD-10-CM

## 2020-10-02 DIAGNOSIS — R590 Localized enlarged lymph nodes: Secondary | ICD-10-CM

## 2020-10-02 LAB — GLUCOSE, CAPILLARY: Glucose-Capillary: 122 mg/dL — ABNORMAL HIGH (ref 70–99)

## 2020-10-02 MED ORDER — FLUDEOXYGLUCOSE F - 18 (FDG) INJECTION
9.7000 | Freq: Once | INTRAVENOUS | Status: AC | PRN
  Administered 2020-10-02: 10.02 via INTRAVENOUS

## 2020-10-03 NOTE — Progress Notes (Signed)
Patient on schedule for Supraclavicular LN biopsy 10/07/2020, called and spoke with patient on phone with pre procedure instructions given. Made aware to be here @ 1300, NPO after 0600, and driver for discharge post procedure/recovery in case she desires sedation.. Stated understanding.

## 2020-10-05 ENCOUNTER — Other Ambulatory Visit: Payer: Self-pay | Admitting: Radiology

## 2020-10-06 ENCOUNTER — Encounter: Payer: Self-pay | Admitting: *Deleted

## 2020-10-06 ENCOUNTER — Encounter: Payer: Self-pay | Admitting: Radiation Oncology

## 2020-10-06 ENCOUNTER — Ambulatory Visit
Admission: RE | Admit: 2020-10-06 | Discharge: 2020-10-06 | Disposition: A | Discharge status: Home / Self Care | Payer: 59 | Source: Ambulatory Visit | Attending: Radiation Oncology | Admitting: Radiation Oncology

## 2020-10-06 VITALS — BP 114/89 | HR 80 | Temp 96.9°F | Wt 188.0 lb

## 2020-10-06 DIAGNOSIS — C3490 Malignant neoplasm of unspecified part of unspecified bronchus or lung: Secondary | ICD-10-CM

## 2020-10-06 NOTE — Consult Note (Signed)
NEW PATIENT EVALUATION  Name: Gina Sanchez  MRN: [id number]  Date:   10/06/2020     DOB: September 28, 1965   This 55 y.o. 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 Consult    DIAGNOSIS: There were no encounter diagnoses.   PREVIOUS INVESTIGATIONS:  PET CT scans reviewed Pathology report reviewed Clinical notes reviewed Case presented at weekly tumor conference  HPI: Patient is a 55 year old female who presented with right thigh pain found to have an acute right proximal femoral shaft fracture.  She underwent surgical fixation on 130.  MRI confirmed enhancing osseous lesion with an area concerning for metastatic disease.  Her CT scan of the chest showed mediastinal adenopathy including pretracheal right paratracheal nodules consistent with metastatic disease she also had 2.8 cm left adrenal lesion. Pathology from her right femur showed metastatic poorly differentiated carcinoma with findings compatible with metastatic cancer but because of decalcification immunohistochemical stains was unreliable.  She is planned on having a left supraclavicular ultrasound-guided biopsy tomorrow.  She is seen today for consideration of palliative radiation therapy to her right femur.  She is ambulating with a walker limited.  She has no other areas of pain at this time. PLANNED TREATMENT REGIMEN: Palliative radiation therapy to right lower extremity  PAST MEDICAL HISTORY:  has a past medical history of Abnormal Pap smear of cervix, Anxiety, Depression, Essential hypertension, Femur fracture, right ([place]), Palpitations, and Precordial chest pain.    PAST SURGICAL HISTORY:  Past Surgical History:  Procedure Laterality Date  . BREAST BIOPSY Right 2017   benign  . CERVICAL BIOPSY  W/ LOOP ELECTRODE EXCISION     . COLONOSCOPY WITH PROPOFOL N/A 07/03/2015   Procedure: COLONOSCOPY WITH PROPOFOL;  Surgeon: Hulen Luster, MD;  Location: [facility] ENDOSCOPY;  Service: Gastroenterology;  Laterality: N/A;  . ESOPHAGOGASTRODUODENOSCOPY (EGD) WITH PROPOFOL N/A 07/03/2015   Procedure: ESOPHAGOGASTRODUODENOSCOPY (EGD) WITH PROPOFOL;  Surgeon: Hulen Luster, MD;  Location: [facility] ENDOSCOPY;  Service: Gastroenterology;  Laterality: N/A;  . INTRAMEDULLARY (IM) NAIL INTERTROCHANTERIC Right 09/15/2020   Procedure: INTRAMEDULLARY (IM) NAIL INTERTROCHANTRIC;  Surgeon: Corky Mull, MD;  Location: [facility] ORS;  Service: Orthopedics;  Laterality: Right;  . LEFT HEART CATH AND CORONARY ANGIOGRAPHY N/A 03/28/2017   Procedure: Left Heart Cath and Coronary Angiography;  Surgeon: Lorretta Harp, MD;  Location: [place] [facility];  Service: Cardiovascular;  Laterality: N/A;    FAMILY HISTORY: family history includes Colon cancer in her maternal grandfather; Diabetes in her mother; Goiter in her mother; Heart disease in her father; Osteoporosis in her maternal grandmother.  SOCIAL HISTORY:  reports that she quit smoking about 2 weeks ago. She has a 30.00 pack-year smoking history. She has never used smokeless tobacco. She reports previous alcohol use. She reports that she does not use drugs.  ALLERGIES: Factive [gemifloxacin]  MEDICATIONS:  Current Outpatient Medications  Medication Sig Dispense Refill  . acetaminophen (TYLENOL) 325 MG tablet Take 1-2 tablets (325-650 mg total) by mouth every 6 (six) hours as needed for mild pain (pain score 1-3 or temp > 100.5).    Marland Kitchen ibuprofen (ADVIL) 400 MG tablet Take 400 mg by mouth every 4 (four) hours as needed.    Marland Kitchen LORazepam (ATIVAN) 0.5 MG tablet Take 1 tablet (0.5 mg total) by mouth every 6 (six) hours as  needed for anxiety. 60 tablet 0  . metoprolol succinate (TOPROL-XL) 25 MG 24 hr tablet Take 25 mg by mouth daily.    . ondansetron (ZOFRAN) 4 MG tablet Take 4 mg by mouth every 8 (eight) hours as  needed for nausea or vomiting.    Marland Kitchen oxyCODONE (OXY IR/ROXICODONE) 5 MG immediate release tablet Take 1-2 tablets (5-10 mg total) by mouth every 4 (four) hours as needed for moderate pain. 50 tablet 0  . polyethylene glycol (MIRALAX) 17 g packet Take 17 g by mouth daily as needed for moderate constipation. 30 each 0  . enoxaparin (LOVENOX) 40 MG/0.4ML injection Inject 0.4 mLs (40 mg total) into the skin daily. (Patient not taking: Reported on 10/06/2020) 5.6 mL 0  . omeprazole (PRILOSEC) 20 MG capsule Take 1 capsule by mouth every morning.     No current facility-administered medications for this encounter.    ECOG PERFORMANCE STATUS:  2 - Symptomatic, <50% confined to bed  REVIEW OF SYSTEMS: Patient denies any weight loss, fatigue, weakness, fever, chills or night sweats. Patient denies any loss of vision, blurred vision. Patient denies any ringing  of the ears or hearing loss. No irregular heartbeat. Patient denies heart murmur or history of fainting. Patient denies any chest pain or pain radiating to her upper extremities. Patient denies any shortness of breath, difficulty breathing at night, cough or hemoptysis. Patient denies any swelling in the lower legs. Patient denies any nausea vomiting, vomiting of blood, or coffee ground material in the vomitus. Patient denies any stomach pain. Patient states has had normal bowel movements no significant constipation or diarrhea. Patient denies any dysuria, hematuria or significant nocturia. Patient denies any problems walking, swelling in the joints or loss of balance. Patient denies any skin changes, loss of hair or loss of weight. Patient denies any excessive worrying or anxiety or significant depression. Patient denies any problems with insomnia. Patient denies excessive thirst, polyuria, polydipsia. Patient denies any swollen glands, patient denies easy bruising or easy bleeding. Patient denies any recent infections, allergies or URI. Patient "s visual  fields have not changed significantly in recent time.   PHYSICAL EXAM: BP 114/89   Pulse 80   Temp (!) 96.9 F (36.1 C) (Tympanic)   Wt 188 lb (85.3 kg)   LMP 09/15/2018   BMI 29.44 kg/m  Range of motion of right lower extremity is limited no significant pain is generated.  Well-developed well-nourished patient in NAD. HEENT reveals PERLA, EOMI, discs not visualized.  Oral cavity is clear. No oral mucosal lesions are identified. Neck is clear without evidence of cervical or supraclavicular adenopathy. Lungs are clear to A&P. Cardiac examination is essentially unremarkable with regular rate and rhythm without murmur rub or thrill. Abdomen is benign with no organomegaly or masses noted. Motor sensory and DTR levels are equal and symmetric in the upper and lower extremities. Cranial nerves II through XII are grossly intact. Proprioception is intact. No peripheral adenopathy or edema is identified. No motor or sensory levels are noted. Crude visual fields are within normal range.  LABORATORY DATA: Pathology report reviewed    RADIOLOGY RESULTS: CT scans and PET CT scans reviewed compatible with above-stated findings   IMPRESSION: Widespread metastatic disease from tumor of unknown origin presumed lung to have further pathology performed on left supraclavicular lymph node and patient status post surgical pinning of her right femur for metastatic pathologic fracture in 55 year old female  PLAN: At this time at ahead with palliative radiation therapy to her right  femur.  We will plan on delivering 30 Gray in 10 fractions.  Risks and benefits of treatment occluding skin reaction fatigue all were reviewed with the patient and her husband.  They both seem to comprehend our treatment plan well.  I personally set up and ordered simulation for this week.  I would like to take this opportunity to thank you for allowing me to participate in the care of your patient.Noreene Filbert, MD

## 2020-10-06 NOTE — Progress Notes (Signed)
  Oncology Nurse Navigator Documentation  Navigator Location: [facility] (10/06/20 1000)   )Navigator Encounter Type: Initial RadOnc (10/06/20 1000)                       Treatment Phase: Pre-Tx/Tx Discussion (10/06/20 1000) Barriers/Navigation Needs: Coordination of Care;Mobility Issues;Pain (10/06/20 1000)   Interventions: Coordination of Care (10/06/20 1000)   Coordination of Care: Appts (10/06/20 1000)        Acuity: Level 3-Moderate Needs (3-4 Barriers Identified) (10/06/20 1000)    met with patient and her husband during initial consult with Dr. Baruch Gouty. All questions answered during visit. Reviewed upcoming appts with patient. Pt aware of biopsy tomorrow and follow up with Dr. Janese Banks next week to discuss all results. Contact info given and instructed to call with any further questions or needs. Pt verbalized understanding. Nothing further needed at this time.     Time Spent with Patient: 60 (10/06/20 1000)

## 2020-10-07 ENCOUNTER — Other Ambulatory Visit: Payer: Self-pay

## 2020-10-07 ENCOUNTER — Ambulatory Visit
Admission: RE | Admit: 2020-10-07 | Discharge: 2020-10-07 | Disposition: A | Discharge status: Home / Self Care | Payer: 59 | Source: Ambulatory Visit | Attending: Oncology | Admitting: Oncology

## 2020-10-07 DIAGNOSIS — M84551D Pathological fracture in neoplastic disease, right femur, subsequent encounter for fracture with routine healing: Secondary | ICD-10-CM | POA: Insufficient documentation

## 2020-10-07 DIAGNOSIS — Z79899 Other long term (current) drug therapy: Secondary | ICD-10-CM | POA: Insufficient documentation

## 2020-10-07 DIAGNOSIS — F411 Generalized anxiety disorder: Secondary | ICD-10-CM | POA: Insufficient documentation

## 2020-10-07 DIAGNOSIS — I10 Essential (primary) hypertension: Secondary | ICD-10-CM | POA: Diagnosis not present

## 2020-10-07 DIAGNOSIS — Z87891 Personal history of nicotine dependence: Secondary | ICD-10-CM | POA: Diagnosis not present

## 2020-10-07 DIAGNOSIS — F32A Depression, unspecified: Secondary | ICD-10-CM | POA: Insufficient documentation

## 2020-10-07 DIAGNOSIS — C7951 Secondary malignant neoplasm of bone: Secondary | ICD-10-CM | POA: Insufficient documentation

## 2020-10-07 DIAGNOSIS — C770 Secondary and unspecified malignant neoplasm of lymph nodes of head, face and neck: Secondary | ICD-10-CM | POA: Diagnosis not present

## 2020-10-07 DIAGNOSIS — R599 Enlarged lymph nodes, unspecified: Secondary | ICD-10-CM | POA: Diagnosis present

## 2020-10-07 DIAGNOSIS — C801 Malignant (primary) neoplasm, unspecified: Secondary | ICD-10-CM | POA: Diagnosis not present

## 2020-10-07 DIAGNOSIS — R590 Localized enlarged lymph nodes: Secondary | ICD-10-CM

## 2020-10-07 NOTE — Procedures (Signed)
Interventional Radiology Procedure Note  Procedure: US guided biopsy of left supraclavicular lymph nodes.  Indication: Multifocal adenopathy.  Complications: None  Bleeding: Minimal  Miachel Roux, MD [phone]

## 2020-10-07 NOTE — H&P (Signed)
Chief Complaint: Patient was seen in consultation today for lymph node biopsy   Referring Physician(s): Sindy Guadeloupe  Supervising Physician: Mir, Sharen Heck  Patient Status: [facility] - Out-pt  History of Present Illness: Gina Sanchez is a 55 y.o. 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 She presented to the ED in early January 2022 with worsening right leg pain. Imaging was positive for a right proximal femur fracture with a lesion seen in the midshaft. Additional imaging showed widespread adenopathy. She underwent a right ORIF 10/12/20  with biopsy of the lesion. Pathology was positive for poorly differentiated carcinoma but because of decalcification a preliminary but not definitive diagnosis of lung cancer was made. A PET scan 10/02/20 identified left supraclavicular adenopathy.  Interventional Radiology has been asked to evaluate this patient for an image-guided lymph node biopsy for further work up and diagnosis. Images were reviewed and procedure approved by Dr. Kathlene Cote.   Past Medical History:  Diagnosis Date  . Abnormal Pap smear of cervix   . Anxiety   . Depression   . Essential hypertension   . Femur fracture, right ([place])   . Palpitations   . Precordial chest pain     Past Surgical History:  Procedure Laterality Date  . BREAST BIOPSY Right 2017   benign  . CERVICAL BIOPSY  W/ LOOP ELECTRODE EXCISION    . COLONOSCOPY WITH PROPOFOL N/A 07/03/2015   Procedure: COLONOSCOPY WITH PROPOFOL;  Surgeon: Hulen Luster, MD;  Location: [facility] ENDOSCOPY;  Service: Gastroenterology;  Laterality: N/A;  . ESOPHAGOGASTRODUODENOSCOPY (EGD) WITH PROPOFOL N/A 07/03/2015   Procedure: ESOPHAGOGASTRODUODENOSCOPY (EGD) WITH PROPOFOL;  Surgeon: Hulen Luster, MD;  Location: [facility] ENDOSCOPY;  Service: Gastroenterology;  Laterality: N/A;  . INTRAMEDULLARY (IM) NAIL INTERTROCHANTERIC Right 09/15/2020   Procedure:  INTRAMEDULLARY (IM) NAIL INTERTROCHANTRIC;  Surgeon: Corky Mull, MD;  Location: [facility] ORS;  Service: Orthopedics;  Laterality: Right;  . LEFT HEART CATH AND CORONARY ANGIOGRAPHY N/A 03/28/2017   Procedure: Left Heart Cath and Coronary Angiography;  Surgeon: Lorretta Harp, MD;  Location: [place] CV LAB;  Service: Cardiovascular;  Laterality: N/A;    Allergies: Factive [gemifloxacin]  Medications: Prior to Admission medications   Medication Sig Start Date End Date Taking? Authorizing Provider  acetaminophen (TYLENOL) 325 MG tablet Take 1-2 tablets (325-650 mg total) by mouth every 6 (six) hours as needed for mild pain (pain score 1-3 or temp > 100.5). 09/17/20   Loletha Grayer, MD  enoxaparin (LOVENOX) 40 MG/0.4ML injection Inject 0.4 mLs (40 mg total) into the skin daily. Patient not taking: Reported on 10/06/2020 09/17/20   Lattie Corns, PA-C  ibuprofen (ADVIL) 400 MG tablet Take 400 mg by mouth every 4 (four) hours as needed.    [provider]  LORazepam (ATIVAN) 0.5 MG tablet Take 1 tablet (0.5 mg total) by mouth every 6 (six) hours as needed for anxiety. 09/26/20   Sindy Guadeloupe, MD  metoprolol succinate (TOPROL-XL) 25 MG 24 hr tablet Take 25 mg by mouth daily.    [provider]  omeprazole (PRILOSEC) 20 MG capsule Take 1 capsule by mouth every morning.    [provider]  ondansetron (ZOFRAN) 4 MG tablet Take 4 mg by mouth every 8 (eight) hours as needed for nausea or vomiting.    [provider]  oxyCODONE (OXY IR/ROXICODONE) 5 MG immediate release tablet Take 1-2 tablets (5-10 mg total) by mouth every 4 (four)  hours as needed for moderate pain. 09/17/20   Lattie Corns, PA-C  polyethylene glycol (MIRALAX) 17 g packet Take 17 g by mouth daily as needed for moderate constipation. 09/17/20   Loletha Grayer, MD     Family History  Problem Relation Age of Onset  . Diabetes Mother        alive @ 16  . Goiter Mother   . Heart disease  Father        died of MI @ 83  . Osteoporosis Maternal Grandmother   . Colon cancer Maternal Grandfather   . Breast cancer Neg Hx   . Ovarian cancer Neg Hx     Social History   Socioeconomic History  . Marital status: Married    Spouse name: Not on file  . Number of children: Not on file  . Years of education: Not on file  . Highest education level: Not on file  Occupational History  . Not on file  Tobacco Use  . Smoking status: Former Smoker    Packs/day: 1.00    Years: 30.00    Pack years: 30.00    Quit date: 09/17/2020    Years since quitting: 0.0  . Smokeless tobacco: Never Used  Vaping Use  . Vaping Use: Never used  Substance and Sexual Activity  . Alcohol use: Not Currently    Alcohol/week: 0.0 standard drinks  . Drug use: No  . Sexual activity: Yes    Birth control/protection: I.U.D., Post-menopausal  Other Topics Concern  . Not on file  Social History Narrative   Lives in [place] with her husband.  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   Social Determinants of Health   Financial Resource Strain: Not on file  Food Insecurity: Not on file  Transportation Needs: Not on file  Physical Activity: Not on file  Stress: Not on file  Social Connections: Not on file    Review of Systems: A 12 point ROS discussed and pertinent positives are indicated in the HPI above.  All other systems are negative.   Vital Signs: BP 129/90 (BP Location: Left Arm)   Pulse 78   Resp 18   LMP 09/15/2018   SpO2 100%   Physical Exam Constitutional:      General: She is not in acute distress. Pulmonary:     Effort: Pulmonary effort is normal.  Neurological:     Mental Status: She is alert and oriented to person, place, and time.     Imaging: DG Pelvis 1-2 Views  Result Date: 09/14/2020 CLINICAL DATA:  Thigh and leg pain EXAM: PELVIS - 1-2 VIEW COMPARISON:  None. FINDINGS: There is no evidence of pelvic fracture or diastasis.  The sacroiliac joints are intact. The visualized deep pelvis is unremarkable. IMPRESSION: Negative. Electronically Signed   By: Prudencio Pair M.D.   On: 09/14/2020 19:54   MR Brain W Wo Contrast  Result Date: 10/01/2020 CLINICAL DATA:  Metastatic disease evaluation.  Femur malignancy. EXAM: MRI HEAD WITHOUT AND WITH CONTRAST TECHNIQUE: Multiplanar, multiecho pulse sequences of the brain and surrounding structures were obtained without and with intravenous contrast. CONTRAST:  63m GADAVIST GADOBUTROL 1 MMOL/ML IV SOLN COMPARISON:  None. FINDINGS: Brain: No acute infarction, hemorrhage, hydrocephalus, or extra-axial fluid collection. There are two punctate foci of enhancement in the right frontal lobe (series 15, image 11 and series 14, images 31 and 34). No associated edema or mass effect. Vascular: Major arterial flow voids are  maintained at the skull base. Skull and upper cervical spine: Normal marrow signal. Sinuses/Orbits: Sinuses are largely clear.  Unremarkable orbits. Other: No mastoid effusions. IMPRESSION: Two punctate foci of enhancement in the right frontal lobe (series 15, image 11 and series 14, images 31 and 34), which are concerning for metastases given the clinical history. No substantial edema or mass effect. These results will be called to the ordering clinician or representative by the Radiologist Assistant, and communication documented in the PACS or [employer]. Electronically Signed   By: Margaretha Sheffield MD   On: 10/01/2020 12:08   MR FEMUR RIGHT W WO CONTRAST  Result Date: 09/15/2020 CLINICAL DATA:  Right leg and thigh pain EXAM: MRI OF THE RIGHT FEMUR WITHOUT AND WITH CONTRAST TECHNIQUE: Multiplanar, multisequence MR imaging of the right was performed both before and after administration of intravenous contrast. CONTRAST:  7 mL Gadavist COMPARISON:  None. FINDINGS: Bones/Joint/Cartilage There is a comminuted impacted laterally angulated fracture of the proximal femoral shaft.  There is an underlying heterogeneously enhancing intramedullary T1 dark/T2 bright osseous lesion within this region. There does appear to be possible heterogeneous hematoma seen along the proximal anteromedial aspect of the femoral shaft. No other acute osseous abnormality is seen. A small hip joint effusion is seen. Ligaments Suboptimally visualized Muscles and Tendons There is also surrounding non loculated fluid and muscular edema seen. The edema extends around the anterolateral aspect of the femur musculature and within the adductor musculature. The visualized portion of the tendons appear to be grossly intact. Soft tissues Diffuse subcutaneous and soft tissue edema seen surrounding the antro lateral aspect of the proximal femur. IMPRESSION: Comminuted impacted laterally angulated proximal femoral shaft fracture. There does appear to be a heterogeneously enhancing osseous lesion within this area which is non-specific could be osseous neoplasm or metastatic lesion. Heterogeneously enhancing hematoma , seen along the medial aspect of the proximal femoral shaft. Diffuse muscular edema seen surrounding the proximal femur. Electronically Signed   By: Prudencio Pair M.D.   On: 09/15/2020 02:23   CT CHEST ABDOMEN PELVIS W CONTRAST  Result Date: 09/15/2020 CLINICAL DATA:  Possible pathologic femoral fracture, metastatic evaluation EXAM: CT CHEST, ABDOMEN, AND PELVIS WITH CONTRAST TECHNIQUE: Multidetector CT imaging of the chest, abdomen and pelvis was performed following the standard protocol during bolus administration of intravenous contrast. CONTRAST:  186m OMNIPAQUE IOHEXOL 300 MG/ML  SOLN COMPARISON:  None. FINDINGS: CT CHEST FINDINGS Cardiovascular: Normal heart size. No pericardial effusion. Thoracic aorta is normal in caliber. Mediastinum/Nodes: Mediastinal adenopathy is present. For example, enlarged pretracheal node measuring 2.2 x 1.5 cm; right paratracheal node measuring 2.7 x 1.3 cm. Prevascular node  measuring 0.3 x 0.9 cm. There is a 1 cm nodule of the partially imaged right thyroid lobe. Esophagus is unremarkable. No hilar or axillary adenopathy. Lungs/Pleura: There is a 5 x 4 mm nodule the right middle lobe (series 4, image 83). Dependent atelectasis/scarring. No pleural effusion. Musculoskeletal: No chest wall mass or suspicious bone lesion. CT ABDOMEN PELVIS FINDINGS Hepatobiliary: Stable small cyst at the dome of the liver. Two areas of subcapsular ill-defined low-attenuation in the left hepatic lobe measuring up to 1.8 cm (series 2, image 46). Gallbladder is unremarkable. No biliary dilatation. Pancreas: Unremarkable. Spleen: Unremarkable. Adrenals/Urinary Tract: Right adrenal is unremarkable. There is a 2.8 x 1.9 cm left adrenal lesion. Too small to characterize low-attenuation lesion of the upper pole of the right kidney. Small cyst of the lower pole of the left kidney. 3 mm nonobstructing calculus  of the upper pole the right kidney. Ureters are unremarkable. There is mild layering hyperdensity in the bladder. Stomach/Bowel: Stomach is within normal limits. Bowel is normal in caliber. Distal colonic diverticulosis. Normal appendix. Vascular/Lymphatic: Aortic atherosclerosis. No enlarged lymph nodes. Reproductive: Uterus and bilateral adnexa are unremarkable. Other: No ascites.  Abdominal wall is unremarkable. Musculoskeletal: Comminuted and angulated proximal right femoral shaft fracture with adjacent hematoma as described previously. Transitional anatomy at the lumbosacral junction. IMPRESSION: Mediastinal adenopathy. 4.5 mm right middle lobe nodule. 1 cm right thyroid lobe nodule. Ultrasound is not recommended by current guidelines. (Ref: J Am Coll Radiol. 2015 Feb;12(2): 143-50). Indeterminate 2.8 cm left adrenal lesion. Two small areas of subcapsular left hepatic lobe low density, which could reflect focal fat or lesions. Layering mild hyperdensity within the bladder, which could reflect  proteinaceous material or blood products. Electronically Signed   By: Macy Mis M.D.   On: 09/15/2020 09:42   NM PET Image Initial (PI) Skull Base To Thigh  Result Date: 10/02/2020 CLINICAL DATA:  Initial treatment strategy for metastatic poorly differentiated carcinoma. EXAM: NUCLEAR MEDICINE PET SKULL BASE TO THIGH TECHNIQUE: 10.02 mCi F-18 FDG was injected intravenously. Full-ring PET imaging was performed from the skull base to thigh after the radiotracer. CT data was obtained and used for attenuation correction and anatomic localization. Fasting blood glucose: 122 mg/dl COMPARISON:  None FINDINGS: Mediastinal blood pool activity: SUV max 3.2 Liver activity: SUV max NA NECK: FDG avid left level 3 lymph node measures 0.6 cm and has an SUV max of 5.38, image 46/3. Left level 4 lymph node measures 0.9 cm and has an SUV max of 6.26, image 63/3. Incidental CT findings: none CHEST: Bilateral FDG avid mediastinal lymph nodes: Right pre-vascular node measures 0.8 cm with SUV max of 7.13, image 91/3. Right paratracheal lymph node measures 1.6 cm with SUV max of 14.67, image 83/3. Left paratracheal lymph node measures 1.2 cm within SUV max of 6.06, image 82/3. Low right paratracheal lymph node at the level of the carina measures 0.7 cm within SUV max of 4.58, image 90/3. No FDG avid axillary, right supraclavicular, or hilar lymph nodes. 5 mm right middle lobe lung nodule is too small to characterize, image 109/3. Incidental CT findings: none ABDOMEN/PELVIS: No abnormal FDG uptake within the liver, pancreas, or spleen. FDG avid bilateral adrenal metastasis: -left adrenal lesion measures 2.4 by 4.1 cm and has an SUV max of 9.9, image 145/3. Right adrenal lesion measures 1.1 x 1.7 cm and has an SUV max of 10.23. Porta hepatic lymph node is FDG avid measuring 9.17. This is difficult to visualize on the corresponding CT images. Incidental CT findings: Aortic atherosclerosis.  No aneurysm. SKELETON: Lytic lesion  involving the left iliac wing has an SUV max of 9.99, image 230/3. Postoperative change from ORIF of proximal right femur pathologic fracture. Intense FDG uptake is identified in this area with an SUV max of 19.01. Incidental CT findings: none IMPRESSION: 1. Site of primary neoplasm not confidently identified. 2. Evidence of left level 3 and level 4 FDG avid cervical nodal metastasis. There is also FDG avid bilateral mediastinal nodal metastasis. Single node within the porta hepatic region is FDG avid and also worrisome for metastatic disease. 3. Bilateral FDG avid adrenal gland metastasis. 4. FDG avid left iliac wing lytic lesion consistent with osseous metastasis. Known pathologic fracture with underlying FDG avid tumor is associated with the proximal right femur. The patient has undergone ORIF of the pathologic fracture. 5.  Aortic  Atherosclerosis (ICD10-I70.0). Electronically Signed   By: Kerby Moors M.D.   On: 10/02/2020 11:59   DG HIP OPERATIVE UNILAT W OR W/O PELVIS RIGHT  Result Date: 09/15/2020 CLINICAL DATA:  Known pathologic right femoral fracture EXAM: OPERATIVE RIGHT HIP WITH PELVIS COMPARISON:  09/14/2020 FLUOROSCOPY TIME:  Radiation Exposure Index (as provided by the fluoroscopic device): 9.24 mGy If the device does not provide the exposure index: Fluoroscopy Time:  56 seconds Number of Acquired Images:  4 FINDINGS: Medullary rod is noted within the right femur with proximal and distal fixation screws. The previously seen fracture is well aligned. A permeative pattern is noted within the underlying bone stable from the prior exam. IMPRESSION: ORIF of right femoral fracture. Electronically Signed   By: Inez Catalina M.D.   On: 09/15/2020 16:41   DG FEMUR, MIN 2 VIEWS RIGHT  Result Date: 09/14/2020 CLINICAL DATA:  Pain for 1 month EXAM: RIGHT FEMUR 2 VIEWS COMPARISON:  None. FINDINGS: There is a comminuted mildly angulated proximal right femoral shaft fracture. There is question of underlying  mottled lesion seen within the midshaft of the femur. The femoral head is still well seated within the acetabulum. IMPRESSION: Comminuted mildly angulated proximal right femur fracture. There is question of an underlying mottled lesion. Would recommend nonemergent MRI with contrast for further evaluation. Electronically Signed   By: Prudencio Pair M.D.   On: 09/14/2020 19:53    Labs:  CBC: Recent Labs    09/14/20 1914 09/15/20 0322 09/16/20 0530 09/17/20 0444  WBC 15.4* 15.6* 13.1* 10.6*  HGB 13.7 13.2 12.3 10.7*  HCT 40.6 39.2 37.2 32.6*  PLT 337 305 289 269    COAGS: Recent Labs    09/14/20 1914  INR 1.1    BMP: Recent Labs    09/14/20 1914 09/15/20 0322 09/15/20 1819 09/16/20 0530 09/17/20 0444  NA 137 137  --  138 139  K 3.7 3.5  --  4.2 3.7  CL 103 104  --  106 107  CO2 24 24  --  23 24  GLUCOSE 118* 105*  --  141* 96  BUN 15 17  --  16 15  CALCIUM 9.1 8.7*  --  8.6* 8.4*  CREATININE 0.63 0.63 0.60 0.61 0.60  GFRNONAA >60 >60 >60 >60 >60    LIVER FUNCTION TESTS: Recent Labs    09/14/20 1914  BILITOT 1.0  AST 16  ALT 16  ALKPHOS 84  PROT 7.0  ALBUMIN 3.6    TUMOR MARKERS: No results for input(s): AFPTM, CEA, CA199, CHROMGRNA in the last 8760 hours.  Assessment and Plan:  Suspected metastatic lung cancer; lymphadenopathy: Lajeana C. Maravilla, 55 year old female, presents today for an image-guided lymph node biopsy. This procedure will be done with local anesthesia only.   Risks and benefits of this procedure were discussed with the patient and/or patient's family including, but not limited to bleeding, infection, damage to adjacent structures or low yield requiring additional tests.  All of the questions were answered and there is agreement to proceed.  Consent signed and in chart.   Thank you for this interesting consult.  I greatly enjoyed meeting CORI JUSTUS and look forward to participating in their care.  A copy of this report was sent to  the requesting provider on this date.  Electronically Signed: Soyla Dryer, AGACNP-BC [phone] 10/07/2020, 2:36 PM   I spent a total of  30 Minutes   in face to face in clinical consultation, greater than 50% of  which was counseling/coordinating care for image-guided lymph node biopsy.

## 2020-10-07 NOTE — Discharge Instructions (Signed)
Needle Biopsy, Care After These instructions tell you how to care for yourself after your procedure. Your doctor may also give you more specific instructions. Call your doctor if you have any problems or questions. What can I expect after the procedure? After the procedure, it is common to have:  Soreness.  Bruising.  Mild pain. Follow these instructions at home:  Return to your normal activities as told by your doctor. Ask your doctor what activities are safe for you.  Take over-the-counter and prescription medicines only as told by your doctor.  Wash your hands with soap and water before you change your bandage (dressing). If you cannot use soap and water, use hand sanitizer.  Follow instructions from your doctor about: ? How to take care of your puncture site. ? When and how to change your bandage. ? When to remove your bandage.  Check your puncture site every day for signs of infection. Watch for: ? Redness, swelling, or pain. ? Fluid or blood. ? Pus or a bad smell. ? Warmth.  Do not take baths, swim, or use a hot tub until your doctor approves. Ask your doctor if you may take showers. You may only be allowed to take sponge baths.  Keep all follow-up visits as told by your doctor. This is important.   Contact a doctor if you have:  A fever.  Redness, swelling, or pain at the puncture site, and it lasts longer than a few days.  Fluid, blood, or pus coming from the puncture site.  Warmth coming from the puncture site. Get help right away if:  You have a lot of bleeding from the puncture site. Summary  After the procedure, it is common to have soreness, bruising, or mild pain at the puncture site.  Check your puncture site every day for signs of infection, such as redness, swelling, or pain.  Get help right away if you have severe bleeding from your puncture site. This information is not intended to replace advice given to you by your health care provider. Make  sure you discuss any questions you have with your health care provider. Document Revised: 02/28/2020 Document Reviewed: 02/28/2020 Elsevier Patient Education  [place].

## 2020-10-08 ENCOUNTER — Inpatient Hospital Stay: Payer: 59

## 2020-10-08 ENCOUNTER — Ambulatory Visit
Admission: RE | Admit: 2020-10-08 | Discharge: 2020-10-08 | Disposition: A | Discharge status: Home / Self Care | Payer: 59 | Source: Ambulatory Visit | Attending: Radiation Oncology | Admitting: Radiation Oncology

## 2020-10-08 DIAGNOSIS — F419 Anxiety disorder, unspecified: Secondary | ICD-10-CM | POA: Insufficient documentation

## 2020-10-08 DIAGNOSIS — F17210 Nicotine dependence, cigarettes, uncomplicated: Secondary | ICD-10-CM | POA: Insufficient documentation

## 2020-10-08 DIAGNOSIS — C801 Malignant (primary) neoplasm, unspecified: Secondary | ICD-10-CM | POA: Insufficient documentation

## 2020-10-08 DIAGNOSIS — Z510 Encounter for antineoplastic radiation therapy: Secondary | ICD-10-CM | POA: Diagnosis not present

## 2020-10-08 DIAGNOSIS — C7951 Secondary malignant neoplasm of bone: Secondary | ICD-10-CM | POA: Insufficient documentation

## 2020-10-10 ENCOUNTER — Other Ambulatory Visit: Payer: Self-pay | Admitting: *Deleted

## 2020-10-10 DIAGNOSIS — C3490 Malignant neoplasm of unspecified part of unspecified bronchus or lung: Secondary | ICD-10-CM

## 2020-10-13 ENCOUNTER — Inpatient Hospital Stay: Payer: 59 | Admitting: Oncology

## 2020-10-13 ENCOUNTER — Other Ambulatory Visit: Payer: Self-pay | Admitting: *Deleted

## 2020-10-13 DIAGNOSIS — Z510 Encounter for antineoplastic radiation therapy: Secondary | ICD-10-CM | POA: Diagnosis not present

## 2020-10-13 MED ORDER — OXYCODONE HCL 5 MG PO TABS: 5.0000 mg | ORAL_TABLET | ORAL | 0 refills | Status: DC | PRN

## 2020-10-13 NOTE — Telephone Encounter (Signed)
Pt requesting refill of oxycodone. States is working well to control her pain when alternating with tylenol.

## 2020-10-14 ENCOUNTER — Ambulatory Visit: Admission: RE | Admit: 2020-10-14 | Payer: 59 | Source: Ambulatory Visit

## 2020-10-14 DIAGNOSIS — F17210 Nicotine dependence, cigarettes, uncomplicated: Secondary | ICD-10-CM | POA: Insufficient documentation

## 2020-10-14 DIAGNOSIS — C801 Malignant (primary) neoplasm, unspecified: Secondary | ICD-10-CM | POA: Insufficient documentation

## 2020-10-14 DIAGNOSIS — C7931 Secondary malignant neoplasm of brain: Secondary | ICD-10-CM | POA: Diagnosis not present

## 2020-10-14 DIAGNOSIS — F419 Anxiety disorder, unspecified: Secondary | ICD-10-CM | POA: Insufficient documentation

## 2020-10-14 DIAGNOSIS — C7951 Secondary malignant neoplasm of bone: Secondary | ICD-10-CM | POA: Insufficient documentation

## 2020-10-14 DIAGNOSIS — I10 Essential (primary) hypertension: Secondary | ICD-10-CM | POA: Diagnosis not present

## 2020-10-14 DIAGNOSIS — C799 Secondary malignant neoplasm of unspecified site: Secondary | ICD-10-CM

## 2020-10-14 DIAGNOSIS — Z510 Encounter for antineoplastic radiation therapy: Secondary | ICD-10-CM | POA: Insufficient documentation

## 2020-10-14 DIAGNOSIS — C7970 Secondary malignant neoplasm of unspecified adrenal gland: Secondary | ICD-10-CM | POA: Diagnosis not present

## 2020-10-14 DIAGNOSIS — Z87891 Personal history of nicotine dependence: Secondary | ICD-10-CM | POA: Diagnosis not present

## 2020-10-14 DIAGNOSIS — E785 Hyperlipidemia, unspecified: Secondary | ICD-10-CM | POA: Diagnosis not present

## 2020-10-14 DIAGNOSIS — F418 Other specified anxiety disorders: Secondary | ICD-10-CM | POA: Diagnosis not present

## 2020-10-14 DIAGNOSIS — Z79899 Other long term (current) drug therapy: Secondary | ICD-10-CM | POA: Diagnosis not present

## 2020-10-14 DIAGNOSIS — Z5111 Encounter for antineoplastic chemotherapy: Secondary | ICD-10-CM | POA: Diagnosis not present

## 2020-10-14 DIAGNOSIS — I700 Atherosclerosis of aorta: Secondary | ICD-10-CM | POA: Diagnosis not present

## 2020-10-14 DIAGNOSIS — G893 Neoplasm related pain (acute) (chronic): Secondary | ICD-10-CM | POA: Diagnosis not present

## 2020-10-14 HISTORY — DX: Secondary malignant neoplasm of unspecified site: C79.9

## 2020-10-14 LAB — SURGICAL PATHOLOGY

## 2020-10-15 ENCOUNTER — Other Ambulatory Visit: Payer: Self-pay | Admitting: Oncology

## 2020-10-15 ENCOUNTER — Ambulatory Visit
Admission: RE | Admit: 2020-10-15 | Discharge: 2020-10-15 | Disposition: A | Discharge status: Home / Self Care | Payer: 59 | Source: Ambulatory Visit | Attending: Radiation Oncology | Admitting: Radiation Oncology

## 2020-10-15 ENCOUNTER — Other Ambulatory Visit: Payer: Self-pay | Admitting: *Deleted

## 2020-10-15 DIAGNOSIS — Z5111 Encounter for antineoplastic chemotherapy: Secondary | ICD-10-CM | POA: Diagnosis not present

## 2020-10-15 MED ORDER — OXYCODONE HCL 5 MG PO TABS: 5.0000 mg | ORAL_TABLET | ORAL | 0 refills | Status: DC | PRN

## 2020-10-15 MED ORDER — OXYCODONE HCL 5 MG PO TABS
5.0000 mg | ORAL_TABLET | ORAL | 0 refills | Status: DC | PRN
Start: 2020-10-15 — End: 2020-10-15

## 2020-10-15 NOTE — Addendum Note (Signed)
Addended by: Telford Nab on: 10/15/2020 11:21 AM   Modules accepted: Orders

## 2020-10-16 ENCOUNTER — Encounter: Payer: Self-pay | Admitting: *Deleted

## 2020-10-16 ENCOUNTER — Encounter: Payer: Self-pay | Admitting: Oncology

## 2020-10-16 ENCOUNTER — Inpatient Hospital Stay: Payer: 59 | Attending: Oncology | Admitting: Oncology

## 2020-10-16 ENCOUNTER — Ambulatory Visit
Admission: RE | Admit: 2020-10-16 | Discharge: 2020-10-16 | Disposition: A | Discharge status: Home / Self Care | Payer: 59 | Source: Ambulatory Visit | Attending: Radiation Oncology | Admitting: Radiation Oncology

## 2020-10-16 VITALS — BP 126/81 | HR 79 | Temp 98.2°F | Resp 16 | Wt 182.0 lb

## 2020-10-16 DIAGNOSIS — Z7189 Other specified counseling: Secondary | ICD-10-CM

## 2020-10-16 DIAGNOSIS — C3491 Malignant neoplasm of unspecified part of right bronchus or lung: Secondary | ICD-10-CM

## 2020-10-16 DIAGNOSIS — C7951 Secondary malignant neoplasm of bone: Secondary | ICD-10-CM | POA: Insufficient documentation

## 2020-10-16 DIAGNOSIS — F17210 Nicotine dependence, cigarettes, uncomplicated: Secondary | ICD-10-CM | POA: Insufficient documentation

## 2020-10-16 DIAGNOSIS — C7931 Secondary malignant neoplasm of brain: Secondary | ICD-10-CM | POA: Insufficient documentation

## 2020-10-16 DIAGNOSIS — I700 Atherosclerosis of aorta: Secondary | ICD-10-CM | POA: Insufficient documentation

## 2020-10-16 DIAGNOSIS — Z79899 Other long term (current) drug therapy: Secondary | ICD-10-CM | POA: Insufficient documentation

## 2020-10-16 DIAGNOSIS — C7970 Secondary malignant neoplasm of unspecified adrenal gland: Secondary | ICD-10-CM | POA: Insufficient documentation

## 2020-10-16 DIAGNOSIS — G893 Neoplasm related pain (acute) (chronic): Secondary | ICD-10-CM | POA: Insufficient documentation

## 2020-10-16 DIAGNOSIS — F418 Other specified anxiety disorders: Secondary | ICD-10-CM | POA: Insufficient documentation

## 2020-10-16 DIAGNOSIS — E785 Hyperlipidemia, unspecified: Secondary | ICD-10-CM | POA: Insufficient documentation

## 2020-10-16 DIAGNOSIS — Z510 Encounter for antineoplastic radiation therapy: Secondary | ICD-10-CM | POA: Insufficient documentation

## 2020-10-16 DIAGNOSIS — Z87891 Personal history of nicotine dependence: Secondary | ICD-10-CM | POA: Insufficient documentation

## 2020-10-16 DIAGNOSIS — C801 Malignant (primary) neoplasm, unspecified: Secondary | ICD-10-CM | POA: Insufficient documentation

## 2020-10-16 DIAGNOSIS — I10 Essential (primary) hypertension: Secondary | ICD-10-CM | POA: Insufficient documentation

## 2020-10-16 DIAGNOSIS — Z5111 Encounter for antineoplastic chemotherapy: Secondary | ICD-10-CM | POA: Insufficient documentation

## 2020-10-16 NOTE — Progress Notes (Signed)
Pt is doing great for the situation she is in. She getting better using walker and finding different ways to get comfortable with hip. She has said that appetite goes in and out but she feels it is the anixety of waiting about what is going on with her and the cancer

## 2020-10-16 NOTE — Progress Notes (Signed)
  Oncology Nurse Navigator Documentation  Navigator Location: [facility]Med Onc (10/16/20 1300)   )Navigator Encounter Type: Follow-up Appt (10/16/20 1300)                   Treatment Initiated Date: 10/15/20 (10/16/20 1300) Patient Visit Type: MedOnc (10/16/20 1300)   Barriers/Navigation Needs: Coordination of Care;Education (10/16/20 1300) Education: Newly Diagnosed Cancer Education;Understanding Cancer/ Treatment Options (10/16/20 1300) Interventions: Education;Referrals (10/16/20 1300) Referrals: Other (second opinion) (10/16/20 1300) Coordination of Care: Appts;Chemo (10/16/20 1300) Education Method: Written (10/16/20 1300)         met with patient and her husband during follow up visit with Dr. Janese Banks to discuss recent biopsy results. All questions answered during visit. Informed pt that will follow up with her once liquid biopsy results are reported on 10/21/20 to discuss next steps based on results. After visit, pt voiced that she is interested in second opinion at [facility] while pursuing further workup. Informed pt that will assist in referral process. Nothing further needed at this time. Instructed pt to call with any further questions or needs. Pt verbalized understanding.        Time Spent with Patient: 60 (10/16/20 1300)

## 2020-10-17 ENCOUNTER — Ambulatory Visit
Admission: RE | Admit: 2020-10-17 | Discharge: 2020-10-17 | Disposition: A | Discharge status: Home / Self Care | Payer: 59 | Source: Ambulatory Visit | Attending: Radiation Oncology | Admitting: Radiation Oncology

## 2020-10-17 DIAGNOSIS — Z5111 Encounter for antineoplastic chemotherapy: Secondary | ICD-10-CM | POA: Diagnosis not present

## 2020-10-19 DIAGNOSIS — C3490 Malignant neoplasm of unspecified part of unspecified bronchus or lung: Secondary | ICD-10-CM | POA: Insufficient documentation

## 2020-10-19 DIAGNOSIS — Z7189 Other specified counseling: Secondary | ICD-10-CM | POA: Insufficient documentation

## 2020-10-19 MED ORDER — FOLIC ACID 1 MG PO TABS: 1.0000 mg | ORAL_TABLET | Freq: Every day | ORAL | 3 refills | Status: DC

## 2020-10-19 MED ORDER — PROCHLORPERAZINE MALEATE 10 MG PO TABS: 10.0000 mg | ORAL_TABLET | Freq: Four times a day (QID) | ORAL | 1 refills | Status: DC | PRN

## 2020-10-19 MED ORDER — LIDOCAINE-PRILOCAINE 2.5-2.5 % EX CREA: TOPICAL_CREAM | CUTANEOUS | 3 refills | Status: DC

## 2020-10-19 MED ORDER — DEXAMETHASONE 4 MG PO TABS: ORAL_TABLET | ORAL | 1 refills | Status: DC

## 2020-10-19 NOTE — Progress Notes (Signed)
START OFF PATHWAY REGIMEN - Non-Small Cell Lung   [id number]:Carboplatin AUC=5 + Pemetrexed 500 mg/m2 q21 Days:   A cycle is every 21 days:     Pemetrexed      Carboplatin   **Always confirm dose/schedule in your pharmacy ordering system**  Patient Characteristics: Stage IV Metastatic, Nonsquamous, Initial Chemotherapy/Immunotherapy, PS = 0, 1, ALK or EGFR or ROS1 or NTRK or MET or RET Genomic Alterations - Awaiting Test Results Therapeutic Status: Stage IV Metastatic Histology: Nonsquamous Cell ROS1 Rearrangement Status: Awaiting Test Results Other Mutations/Biomarkers: Awaiting Test Results Chemotherapy/Immunotherapy LOT: Initial Chemotherapy/Immunotherapy Molecular Targeted Therapy: Not Appropriate KRAS G12C Mutation Status: Awaiting Test Results MET Exon 14 Mutation Status: Awaiting Test Results RET Gene Fusion Status: Awaiting Test Results EGFR Mutation Status: Awaiting Test Results NTRK Gene Fusion Status: Awaiting Test Results PD-L1 Expression Status: Awaiting Test Results ALK Rearrangement Status: Awaiting Test Results BRAF V600E Mutation Status: Awaiting Test Results ECOG Performance Status: 1 Biomarker Assessment Status Confirmation: Awaiting genomic biomarker test(s) results and need to start chemotherapy Intent of Therapy: Non-Curative / Palliative Intent, Discussed with Patient

## 2020-10-19 NOTE — Progress Notes (Signed)
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 She underwent operative fixation on 10/12/2020. MRI of femur showed heterogeneously enhancing osseous lesion within the area which would be nonspecific versus office neoplasm or metastatic lesion. CT chest abdomen and pelvis with contrast showed an enlarged pretracheal lymph node 2.2 x 1.5 cm and a right paratracheal lymph node measuring 2.7 x 1.3 cm. Prevascular node measuring 0.3 x 0.9 cm. 5 x 4 mm right middle lobe nodule. 2.8 x 1.9 cm left adrenal lesion.  Patient smokes about 1 pack of cigarettes every 3 days. Reports that her appetite and weight have remained stable. She does not have significant medical problems. She has been up-to-date with her mammograms.   Reamings from the right femur showed metastatic poorly differentiated carcinoma.  Immunohistochemistry showed was positive for pancytokeratin, CK7 and patchy CK20 with patchy dim expression of TTF-1.  Cells negative for Melan-A, CDX2, PAX8, Napsin A, GATA3, p40, CD56, p16 and thyroglobulin.  Findings compatible with metastatic carcinoma but because of decalcification immunohistochemical staining  is unreliable.  Patchy dim staining with TTF-1 suspicious for lung primary but not a definitive diagnosis.  Repeat supraclavicular lymph node biopsy showed metastatic adenocarcinoma.  Tumor cells positive for CK7 with focal weak staining for TTF-1.  Suggestive of lung origin in the proper clinical context.  Cells were negative for GATA3 PAX8 CDX2 and CK20 and Napsin A.  Interval history- she is moving around better after her hip surgery.She is going through palliative radiation treatment and will finish treatment on 10/28/2020.  ECOG PS- 1 Pain scale- 3 Opioid associated constipation- no  Review of systems- Review of Systems  Constitutional: Positive for malaise/fatigue. Negative for chills, fever and weight loss.  HENT: Negative for congestion, ear discharge and nosebleeds.   Eyes: Negative for blurred vision.  Respiratory: Negative for cough, hemoptysis, sputum production, shortness of breath and wheezing.   Cardiovascular: Negative for chest pain, palpitations, orthopnea and claudication.  Gastrointestinal: Negative for abdominal pain, blood in stool, constipation, diarrhea, heartburn, melena, nausea and vomiting.  Genitourinary: Negative for dysuria, flank pain, frequency, hematuria and urgency.  Musculoskeletal: Negative for back pain, joint pain and myalgias.       Right hip pain  Skin: Negative for rash.  Neurological: Negative for dizziness, tingling, focal weakness, seizures, weakness and headaches.  Endo/Heme/Allergies: Does not bruise/bleed easily.  Psychiatric/Behavioral: Negative for depression and suicidal ideas. The patient does not have insomnia.       Allergies  Allergen Reactions  . Factive [Gemifloxacin] Rash     Past Medical History:  Diagnosis Date  . Abnormal Pap smear of cervix   . Anxiety   . Depression   . Essential hypertension   . Femur fracture, right ([place])   . Palpitations   . Precordial chest pain      Past Surgical History:  Procedure  Laterality Date  . BREAST BIOPSY Right 2017   benign  . CERVICAL BIOPSY  W/ LOOP ELECTRODE EXCISION    . COLONOSCOPY WITH PROPOFOL N/A 07/03/2015   Procedure: COLONOSCOPY WITH PROPOFOL;  Surgeon: Hulen Luster, MD;  Location: [facility] ENDOSCOPY;  Service: Gastroenterology;  Laterality: N/A;  . ESOPHAGOGASTRODUODENOSCOPY (EGD) WITH PROPOFOL N/A 07/03/2015   Procedure: ESOPHAGOGASTRODUODENOSCOPY (EGD) WITH PROPOFOL;  Surgeon: Hulen Luster, MD;  Location: [facility] ENDOSCOPY;  Service: Gastroenterology;  Laterality: N/A;  . INTRAMEDULLARY (IM) NAIL INTERTROCHANTERIC Right 09/15/2020   Procedure: INTRAMEDULLARY (IM) NAIL INTERTROCHANTRIC;  Surgeon: Corky Mull, MD;  Location: [facility] ORS;  Service: Orthopedics;  Laterality: Right;  . LEFT HEART CATH AND CORONARY ANGIOGRAPHY N/A 03/28/2017   Procedure: Left Heart Cath and Coronary Angiography;  Surgeon: Lorretta Harp, MD;  Location: [place] CV LAB;  Service: Cardiovascular;  Laterality: N/A;    Social History   Socioeconomic History  . Marital status: Married    Spouse name: Not on file  . Number of children: Not on file  . Years of education: Not on file  . Highest education level: Not on file  Occupational History  . Not on file  Tobacco Use  . Smoking status: Former Smoker    Packs/day: 1.00    Years: 30.00    Pack years: 30.00    Quit date: 09/17/2020    Years since quitting: 0.0  . Smokeless tobacco: Never Used  Vaping Use  . Vaping Use: Never used  Substance and Sexual Activity  . Alcohol use: Not Currently    Alcohol/week: 0.0 standard drinks  . Drug use: No  . Sexual activity: Yes    Birth control/protection: I.U.D., Post-menopausal  Other Topics Concern  . Not on file  Social History Narrative   Lives in [place] with her husband.  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   Social Determinants of Health   Financial Resource Strain: Not on file  Food Insecurity: Not on file   Transportation Needs: Not on file  Physical Activity: Not on file  Stress: Not on file  Social Connections: Not on file  Intimate Partner Violence: Not on file    Family History  Problem Relation Age of Onset  . Diabetes Mother        alive @ 36  . Goiter Mother   . Heart disease Father        died of MI @ 30  . Osteoporosis Maternal Grandmother   . Colon cancer Maternal Grandfather   . Breast cancer Neg Hx   . Ovarian cancer Neg Hx      Current Outpatient Medications:  .  acetaminophen (TYLENOL) 325 MG tablet, Take 1-2 tablets (325-650 mg total) by mouth every 6 (six) hours as needed for mild pain (pain score 1-3 or temp > 100.5)., Disp: , Rfl:  .  ibuprofen (ADVIL) 400 MG tablet, Take 400 mg by mouth every 4 (four) hours as needed., Disp: , Rfl:  .  LORazepam (ATIVAN) 0.5 MG tablet, Take 1 tablet (0.5 mg total) by mouth every 6 (six) hours as needed for anxiety., Disp: 60 tablet, Rfl: 0 .  metoprolol succinate (TOPROL-XL) 25 MG 24 hr tablet, Take 25 mg by mouth daily., Disp: , Rfl:  .  omeprazole (PRILOSEC) 20 MG capsule, Take 1 capsule by mouth every morning., Disp: , Rfl:  .  oxyCODONE (OXY IR/ROXICODONE) 5 MG immediate release tablet, Take 1-2 tablets (5-10  mg total) by mouth every 4 (four) hours as needed for moderate pain., Disp: 60 tablet, Rfl: 0 .  polyethylene glycol (MIRALAX) 17 g packet, Take 17 g by mouth daily as needed for moderate constipation., Disp: 30 each, Rfl: 0 .  dexamethasone (DECADRON) 4 MG tablet, Take 1 tab two times a day the day before Alimta chemo, then take 2 tabs once a day for 3 days starting the day after cisplatin., Disp: 30 tablet, Rfl: 1 .  folic acid (FOLVITE) 1 MG tablet, Take 1 tablet (1 mg total) by mouth daily. Start 5-7 days before Alimta chemotherapy. Continue until 21 days after Alimta completed., Disp: 100 tablet, Rfl: 3 .  lidocaine-prilocaine (EMLA) cream, Apply to affected area once, Disp: 30 g, Rfl: 3 .  ondansetron (ZOFRAN) 4 MG  tablet, Take 4 mg by mouth every 8 (eight) hours as needed for nausea or vomiting. (Patient not taking: Reported on 10/16/2020), Disp: , Rfl:  .  prochlorperazine (COMPAZINE) 10 MG tablet, Take 1 tablet (10 mg total) by mouth every 6 (six) hours as needed (Nausea or vomiting)., Disp: 30 tablet, Rfl: 1  Physical exam:  Vitals:   10/16/20 1134  BP: 126/81  Pulse: 79  Resp: 16  Temp: 98.2 F (36.8 C)  Weight: 182 lb (82.6 kg)   Physical Exam Constitutional:      General: She is not in acute distress. Eyes:     Extraocular Movements: EOM normal.     Pupils: Pupils are equal, round, and reactive to light.  Cardiovascular:     Rate and Rhythm: Normal rate and regular rhythm.     Heart sounds: Normal heart sounds.  Pulmonary:     Effort: Pulmonary effort is normal.     Breath sounds: Normal breath sounds.  Abdominal:     General: Bowel sounds are normal.     Palpations: Abdomen is soft.  Skin:    General: Skin is warm and dry.  Neurological:     Mental Status: She is alert and oriented to person, place, and time.      CMP Latest Ref Rng & Units 09/17/2020  Glucose 70 - 99 mg/dL 96  BUN 6 - 20 mg/dL 15  Creatinine 0.44 - 1.00 mg/dL 0.60  Sodium 135 - 145 mmol/L 139  Potassium 3.5 - 5.1 mmol/L 3.7  Chloride 98 - 111 mmol/L 107  CO2 22 - 32 mmol/L 24  Calcium 8.9 - 10.3 mg/dL 8.4(L)  Total Protein 6.5 - 8.1 g/dL -  Total Bilirubin 0.3 - 1.2 mg/dL -  Alkaline Phos 38 - 126 U/L -  AST 15 - 41 U/L -  ALT 0 - 44 U/L -   CBC Latest Ref Rng & Units 09/17/2020  WBC 4.0 - 10.5 K/uL 10.6(H)  Hemoglobin 12.0 - 15.0 g/dL 10.7(L)  Hematocrit 36.0 - 46.0 % 32.6(L)  Platelets 150 - 400 K/uL 269       MR Brain W Wo Contrast  Result Date: 10/01/2020 CLINICAL DATA:  Metastatic disease evaluation.  Femur malignancy. EXAM: MRI HEAD WITHOUT AND WITH CONTRAST TECHNIQUE: Multiplanar, multiecho pulse sequences of the brain and surrounding structures were obtained without and with intravenous  contrast. CONTRAST:  79m GADAVIST GADOBUTROL 1 MMOL/ML IV SOLN COMPARISON:  None. FINDINGS: Brain: No acute infarction, hemorrhage, hydrocephalus, or extra-axial fluid collection. There are two punctate foci of enhancement in the right frontal lobe (series 15, image 11 and series 14, images 31 and 34). No associated edema or mass effect. Vascular: Major  arterial flow voids are maintained at the skull base. Skull and upper cervical spine: Normal marrow signal. Sinuses/Orbits: Sinuses are largely clear.  Unremarkable orbits. Other: No mastoid effusions. IMPRESSION: Two punctate foci of enhancement in the right frontal lobe (series 15, image 11 and series 14, images 31 and 34), which are concerning for metastases given the clinical history. No substantial edema or mass effect. These results will be called to the ordering clinician or representative by the Radiologist Assistant, and communication documented in the PACS or [employer]. Electronically Signed   By: Margaretha Sheffield MD   On: 10/01/2020 12:08   NM PET Image Initial (PI) Skull Base To Thigh  Result Date: 10/02/2020 CLINICAL DATA:  Initial treatment strategy for metastatic poorly differentiated carcinoma. EXAM: NUCLEAR MEDICINE PET SKULL BASE TO THIGH TECHNIQUE: 10.02 mCi F-18 FDG was injected intravenously. Full-ring PET imaging was performed from the skull base to thigh after the radiotracer. CT data was obtained and used for attenuation correction and anatomic localization. Fasting blood glucose: 122 mg/dl COMPARISON:  None FINDINGS: Mediastinal blood pool activity: SUV max 3.2 Liver activity: SUV max NA NECK: FDG avid left level 3 lymph node measures 0.6 cm and has an SUV max of 5.38, image 46/3. Left level 4 lymph node measures 0.9 cm and has an SUV max of 6.26, image 63/3. Incidental CT findings: none CHEST: Bilateral FDG avid mediastinal lymph nodes: Right pre-vascular node measures 0.8 cm with SUV max of 7.13, image 91/3. Right paratracheal  lymph node measures 1.6 cm with SUV max of 14.67, image 83/3. Left paratracheal lymph node measures 1.2 cm within SUV max of 6.06, image 82/3. Low right paratracheal lymph node at the level of the carina measures 0.7 cm within SUV max of 4.58, image 90/3. No FDG avid axillary, right supraclavicular, or hilar lymph nodes. 5 mm right middle lobe lung nodule is too small to characterize, image 109/3. Incidental CT findings: none ABDOMEN/PELVIS: No abnormal FDG uptake within the liver, pancreas, or spleen. FDG avid bilateral adrenal metastasis: -left adrenal lesion measures 2.4 by 4.1 cm and has an SUV max of 9.9, image 145/3. Right adrenal lesion measures 1.1 x 1.7 cm and has an SUV max of 10.23. Porta hepatic lymph node is FDG avid measuring 9.17. This is difficult to visualize on the corresponding CT images. Incidental CT findings: Aortic atherosclerosis.  No aneurysm. SKELETON: Lytic lesion involving the left iliac wing has an SUV max of 9.99, image 230/3. Postoperative change from ORIF of proximal right femur pathologic fracture. Intense FDG uptake is identified in this area with an SUV max of 19.01. Incidental CT findings: none IMPRESSION: 1. Site of primary neoplasm not confidently identified. 2. Evidence of left level 3 and level 4 FDG avid cervical nodal metastasis. There is also FDG avid bilateral mediastinal nodal metastasis. Single node within the porta hepatic region is FDG avid and also worrisome for metastatic disease. 3. Bilateral FDG avid adrenal gland metastasis. 4. FDG avid left iliac wing lytic lesion consistent with osseous metastasis. Known pathologic fracture with underlying FDG avid tumor is associated with the proximal right femur. The patient has undergone ORIF of the pathologic fracture. 5.  Aortic Atherosclerosis (ICD10-I70.0). Electronically Signed   By: Kerby Moors M.D.   On: 10/02/2020 11:59   [place] CORE BIOPSY (LYMPH NODES)  Result Date: 10/07/2020 INDICATION: Left supraclavicular  lymph node biopsy EXAM: ULTRASOUND GUIDED CORE BIOPSY OF LEFT SUPRACLAVICULAR LYMPH NODE MEDICATIONS: None. ANESTHESIA/SEDATION: NONE PROCEDURE: The procedure, risks, benefits, and alternatives  were explained to the patient. Questions regarding the procedure were encouraged and answered. The patient understands and consents to the procedure. The operative field was prepped with chlorhexidine in a sterile fashion, and a sterile drape was applied covering the operative field. A sterile gown and sterile gloves were used for the procedure. Local anesthesia was provided with 1% Lidocaine. Sterile ultrasound probe cover and gel utilized throughout the procedure. Ultrasound evaluation of the left neck demonstrated mildly enlarged morphologically abnormal left neck lymph nodes. Following local lidocaine administration, 18 gauge Medax biopsy needle was utilized to obtained 4 cores from the left supraclavicular lymph node. Samples were sent to pathology in sterile saline. COMPLICATIONS: None immediate. FINDINGS: Morphologically abnormal mildly enlarged left supraclavicular lymph nodes. IMPRESSION: Ultrasound-guided biopsy left supraclavicular lymph node as above. Biopsy was difficult due to small size of the lymph nodes and proximity to vessels. Electronically Signed   By: Miachel Roux M.D.   On: 10/07/2020 15:22     Assessment and plan- Patient is a 55 y.o. 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   PET CT scan shows a 6 mm right middle lobe lesion, evidence of mediastinal as well as cervical and supraclavicular adenopathy.  Bilateral adrenal metastases lytic lesion involving the left iliac wing and postoperative changes in the right femur from prior pathologic fracture.  MRI brain also showed 2  punctate lesions in the right frontal lobe concerning for brain metastases.   Pathology showed metastatic adenocarcinoma that was positive for CK7 and weak positive for TTF-1 suggestive of lung primary.  GATA3 and PAX8 was negative ruling out breast/GYN primary.  CDX2 and CK20 negative making GI primary unlikely.  Based on her PET CT scan findings and pathology the clinical picture fits metastatic lung adenocarcinoma.  Unfortunately we do not have enough tissue to run NGS testing.  We have sent off a liquid biopsy for NGS testing from peripheral blood.  If liquid biopsy does not reveal any evidence of targetable mutations we could potentially consider getting a third biopsy either via EBUS or an adrenal gland biopsy.  However I would not like to wait for these results to come back to start treatment.  I would like to proceed treating this as metastatic lung adenocarcinoma with a combination of carboplatin AUC 5 and pemetrexed at 500 mg per metered square given IV every 3 weeks for 4 cycles.  I will plan to add immunotherapy if there is no evidence of actionable mutation seen on liquid biopsy.  Plan to repeat scans after 4 cycles and if patient has stable disease or partial response I will plan to continue Alimta and Keytruda until progression or toxicity.  Discussed risks and benefits of chemotherapy including all but not limited to nausea, vomiting, low blood counts, risk of infections and hospitalizations.  Risk of peripheral neuropathy associated with carboplatin.  Treatment will be given with a palliative intent.  Patient understands and agrees to proceed as planned.  She will need chemo teach and port placement prior to start of chemo and I will tentatively start treatment in 1 week's time  Patient is asymptomatic from her punctate foci of brain metastases.  I would like to give her 4 cycles of chemoimmunotherapy and then consider treating her brain and I will defer to radiation oncology  for this.  I  will discussed the role for bisphosphonates for skeletal metastases at her next visit.  Neoplasm related pain: Continue as needed oxycodone   Visit Diagnosis 1. Primary malignant neoplasm of right lung metastatic to other site [facility])   2. Goals of care, counseling/discussion      Dr. Randa Evens, MD, MPH [facility] [phone] 10/19/2020 9:55 AM

## 2020-10-20 ENCOUNTER — Encounter: Payer: Self-pay | Admitting: *Deleted

## 2020-10-20 ENCOUNTER — Ambulatory Visit
Admission: RE | Admit: 2020-10-20 | Discharge: 2020-10-20 | Disposition: A | Discharge status: Home / Self Care | Payer: 59 | Source: Ambulatory Visit | Attending: Radiation Oncology | Admitting: Radiation Oncology

## 2020-10-20 ENCOUNTER — Encounter: Payer: Self-pay | Admitting: Oncology

## 2020-10-20 DIAGNOSIS — C3490 Malignant neoplasm of unspecified part of unspecified bronchus or lung: Secondary | ICD-10-CM

## 2020-10-20 DIAGNOSIS — E278 Other specified disorders of adrenal gland: Secondary | ICD-10-CM

## 2020-10-20 DIAGNOSIS — Z5111 Encounter for antineoplastic chemotherapy: Secondary | ICD-10-CM | POA: Diagnosis not present

## 2020-10-21 ENCOUNTER — Other Ambulatory Visit: Payer: Self-pay | Admitting: Oncology

## 2020-10-21 ENCOUNTER — Ambulatory Visit
Admission: RE | Admit: 2020-10-21 | Discharge: 2020-10-21 | Disposition: A | Discharge status: Home / Self Care | Payer: 59 | Source: Ambulatory Visit | Attending: Radiation Oncology | Admitting: Radiation Oncology

## 2020-10-21 DIAGNOSIS — Z5111 Encounter for antineoplastic chemotherapy: Secondary | ICD-10-CM | POA: Diagnosis not present

## 2020-10-21 NOTE — Progress Notes (Signed)
Patient on schedule for Adrenal mass biopsy 10/24/2020. Called and spoke with patient on phone with pre procedure instructions given. Made aware to be here @ 1000, NPO after MN prior to procedure, and driver post procedure/recovery/discharge. Stated understanding.

## 2020-10-21 NOTE — Progress Notes (Signed)
  Oncology Nurse Navigator Documentation  Navigator Location: [facility] (10/21/20 1100)   )Navigator Encounter Type: Treatment (10/21/20 1100)                     Patient Visit Type: DXIPJA (10/21/20 1100)   Barriers/Navigation Needs: Coordination of Care;Education (10/21/20 1100) Education: Understanding Cancer/ Treatment Options (10/21/20 1100) Interventions: Coordination of Care (10/21/20 1100)   Coordination of Care: Appts;Radiology;Chemo (10/21/20 1100) Education Method: Verbal (10/21/20 1100)       met with patient and her husband after radiation treatment this morning to review liquid biopsy results and next steps for additional biopsy. Reviewed upcoming appts for adrenal biopsy on Fri 2/11 and chemo to be scheduled early next week. Pt states that she is scheduled for second opinion at [facility] on 2/16. Encouraged pt to keep appt with [facility] while starting treatment here at [facility]. Discussed the reason for another biopsy and needing additional tissue for molecular studies. Pt informed that molecular studies will take an additional 3 weeks to result but that Dr. Janese Banks wishes to proceed with first cycle of chemo while waiting. Informed that treatment plan may change based on results from molecular studies on tissue. All questions answered during visit. Instructed pt to call with any questions or needs. Pt verbalized understanding. Nothing further needed at this time.         Time Spent with Patient: 60 (10/21/20 1100)

## 2020-10-22 ENCOUNTER — Ambulatory Visit
Admission: RE | Admit: 2020-10-22 | Discharge: 2020-10-22 | Disposition: A | Discharge status: Home / Self Care | Payer: 59 | Source: Ambulatory Visit | Attending: Radiation Oncology | Admitting: Radiation Oncology

## 2020-10-22 ENCOUNTER — Inpatient Hospital Stay: Payer: 59

## 2020-10-22 DIAGNOSIS — Z5111 Encounter for antineoplastic chemotherapy: Secondary | ICD-10-CM | POA: Diagnosis not present

## 2020-10-22 DIAGNOSIS — C3490 Malignant neoplasm of unspecified part of unspecified bronchus or lung: Secondary | ICD-10-CM

## 2020-10-22 LAB — CBC
HCT: 40.3 % (ref 36.0–46.0)
Hemoglobin: 13.6 g/dL (ref 12.0–15.0)
MCH: 28.9 pg (ref 26.0–34.0)
MCHC: 33.7 g/dL (ref 30.0–36.0)
MCV: 85.6 fL (ref 80.0–100.0)
Platelets: 347 10*3/uL (ref 150–400)
RBC: 4.71 MIL/uL (ref 3.87–5.11)
RDW: 13.5 % (ref 11.5–15.5)
WBC: 10.4 10*3/uL (ref 4.0–10.5)
nRBC: 0 % (ref 0.0–0.2)

## 2020-10-22 NOTE — Patient Instructions (Signed)
Pembrolizumab injection What is this medicine? PEMBROLIZUMAB (pem broe liz ue mab) is a monoclonal antibody. It is used to treat certain types of cancer. This medicine may be used for other purposes; ask your health care provider or pharmacist if you have questions. COMMON BRAND NAME(S): Keytruda What should I tell my health care provider before I take this medicine? They need to know if you have any of these conditions:  autoimmune diseases like Crohn's disease, ulcerative colitis, or lupus  have had or planning to have an allogeneic stem cell transplant (uses someone else's stem cells)  history of organ transplant  history of chest radiation  nervous system problems like myasthenia gravis or Guillain-Barre syndrome  an unusual or allergic reaction to pembrolizumab, other medicines, foods, dyes, or preservatives  pregnant or trying to get pregnant  breast-feeding How should I use this medicine? This medicine is for infusion into a vein. It is given by a health care professional in a hospital or clinic setting. A special MedGuide will be given to you before each treatment. Be sure to read this information carefully each time. Talk to your pediatrician regarding the use of this medicine in children. While this drug may be prescribed for children as young as 6 months for selected conditions, precautions do apply. Overdosage: If you think you have taken too much of this medicine contact a poison control center or emergency room at once. NOTE: This medicine is only for you. Do not share this medicine with others. What if I miss a dose? It is important not to miss your dose. Call your doctor or health care professional if you are unable to keep an appointment. What may interact with this medicine? Interactions have not been studied. This list may not describe all possible interactions. Give your health care provider a list of all the medicines, herbs, non-prescription drugs, or dietary  supplements you use. Also tell them if you smoke, drink alcohol, or use illegal drugs. Some items may interact with your medicine. What should I watch for while using this medicine? Your condition will be monitored carefully while you are receiving this medicine. You may need blood work done while you are taking this medicine. Do not become pregnant while taking this medicine or for 4 months after stopping it. Women should inform their doctor if they wish to become pregnant or think they might be pregnant. There is a potential for serious side effects to an unborn child. Talk to your health care professional or pharmacist for more information. Do not breast-feed an infant while taking this medicine or for 4 months after the last dose. What side effects may I notice from receiving this medicine? Side effects that you should report to your doctor or health care professional as soon as possible:  allergic reactions like skin rash, itching or hives, swelling of the face, lips, or tongue  bloody or black, tarry  breathing problems  changes in vision  chest pain  chills  confusion  constipation  cough  diarrhea  dizziness or feeling faint or lightheaded  fast or irregular heartbeat  fever  flushing  joint pain  low blood counts - this medicine may decrease the number of white blood cells, red blood cells and platelets. You may be at increased risk for infections and bleeding.  muscle pain  muscle weakness  pain, tingling, numbness in the hands or feet  persistent headache  redness, blistering, peeling or loosening of the skin, including inside the mouth  signs and  symptoms of high blood sugar such as dizziness; dry mouth; dry skin; fruity breath; nausea; stomach pain; increased hunger or thirst; increased urination  signs and symptoms of kidney injury like trouble passing urine or change in the amount of urine  signs and symptoms of liver injury like dark urine,  light-colored stools, loss of appetite, nausea, right upper belly pain, yellowing of the eyes or skin  sweating  swollen lymph nodes  weight loss Side effects that usually do not require medical attention (report to your doctor or health care professional if they continue or are bothersome):  decreased appetite  hair loss  tiredness This list may not describe all possible side effects. Call your doctor for medical advice about side effects. You may report side effects to FDA at 1-800-FDA-1088. Where should I keep my medicine? This drug is given in a hospital or clinic and will not be stored at home. NOTE: This sheet is a summary. It may not cover all possible information. If you have questions about this medicine, talk to your doctor, pharmacist, or health care provider.  2021 [employer]/Gold Standard (2019-08-01 21:44:53) Pemetrexed injection What is this medicine? PEMETREXED (PEM e TREX ed) is a chemotherapy drug used to treat lung cancers like non-small cell lung cancer and mesothelioma. It may also be used to treat other cancers. This medicine may be used for other purposes; ask your health care provider or pharmacist if you have questions. COMMON BRAND NAME(S): Alimta What should I tell my health care provider before I take this medicine? They need to know if you have any of these conditions:  infection (especially a virus infection such as chickenpox, cold sores, or herpes)  kidney disease  low blood counts, like low white cell, platelet, or red cell counts  lung or breathing disease, like asthma  radiation therapy  an unusual or allergic reaction to pemetrexed, other medicines, foods, dyes, or preservative  pregnant or trying to get pregnant  breast-feeding How should I use this medicine? This drug is given as an infusion into a vein. It is administered in a hospital or clinic by a specially trained health care professional. Talk to your pediatrician regarding the  use of this medicine in children. Special care may be needed. Overdosage: If you think you have taken too much of this medicine contact a poison control center or emergency room at once. NOTE: This medicine is only for you. Do not share this medicine with others. What if I miss a dose? It is important not to miss your dose. Call your doctor or health care professional if you are unable to keep an appointment. What may interact with this medicine? This medicine may interact with the following medications:  Ibuprofen This list may not describe all possible interactions. Give your health care provider a list of all the medicines, herbs, non-prescription drugs, or dietary supplements you use. Also tell them if you smoke, drink alcohol, or use illegal drugs. Some items may interact with your medicine. What should I watch for while using this medicine? Visit your doctor for checks on your progress. This drug may make you feel generally unwell. This is not uncommon, as chemotherapy can affect healthy cells as well as cancer cells. Report any side effects. Continue your course of treatment even though you feel ill unless your doctor tells you to stop. In some cases, you may be given additional medicines to help with side effects. Follow all directions for their use. Call your doctor or health care  professional for advice if you get a fever, chills or sore throat, or other symptoms of a cold or flu. Do not treat yourself. This drug decreases your body's ability to fight infections. Try to avoid being around people who are sick. This medicine may increase your risk to bruise or bleed. Call your doctor or health care professional if you notice any unusual bleeding. Be careful brushing and flossing your teeth or using a toothpick because you may get an infection or bleed more easily. If you have any dental work done, tell your dentist you are receiving this medicine. Avoid taking products that contain aspirin,  acetaminophen, ibuprofen, naproxen, or ketoprofen unless instructed by your doctor. These medicines may hide a fever. Call your doctor or health care professional if you get diarrhea or mouth sores. Do not treat yourself. To protect your kidneys, drink water or other fluids as directed while you are taking this medicine. Do not become pregnant while taking this medicine or for 6 months after stopping it. Women should inform their doctor if they wish to become pregnant or think they might be pregnant. Men should not father a child while taking this medicine and for 3 months after stopping it. This may interfere with the ability to father a child. You should talk to your doctor or health care professional if you are concerned about your fertility. There is a potential for serious side effects to an unborn child. Talk to your health care professional or pharmacist for more information. Do not breast-feed an infant while taking this medicine or for 1 week after stopping it. What side effects may I notice from receiving this medicine? Side effects that you should report to your doctor or health care professional as soon as possible:  allergic reactions like skin rash, itching or hives, swelling of the face, lips, or tongue  breathing problems  redness, blistering, peeling or loosening of the skin, including inside the mouth  signs and symptoms of bleeding such as bloody or black, tarry stools; red or dark-brown urine; spitting up blood or brown material that looks like coffee grounds; red spots on the skin; unusual bruising or bleeding from the eye, gums, or nose  signs and symptoms of infection like fever or chills; cough; sore throat; pain or trouble passing urine  signs and symptoms of kidney injury like trouble passing urine or change in the amount of urine  signs and symptoms of liver injury like dark yellow or brown urine; general ill feeling or flu-like symptoms; light-colored stools; loss of  appetite; nausea; right upper belly pain; unusually weak or tired; yellowing of the eyes or skin Side effects that usually do not require medical attention (report to your doctor or health care professional if they continue or are bothersome):  constipation  mouth sores  nausea, vomiting  unusually weak or tired This list may not describe all possible side effects. Call your doctor for medical advice about side effects. You may report side effects to FDA at 1-800-FDA-1088. Where should I keep my medicine? This drug is given in a hospital or clinic and will not be stored at home. NOTE: This sheet is a summary. It may not cover all possible information. If you have questions about this medicine, talk to your doctor, pharmacist, or health care provider.  2021 [employer]/Gold Standard (2017-10-19 16:11:33) Carboplatin injection What is this medicine? CARBOPLATIN (KAR boe pla tin) is a chemotherapy drug. It targets fast dividing cells, like cancer cells, and causes these cells to  die. This medicine is used to treat ovarian cancer and many other cancers. This medicine may be used for other purposes; ask your health care provider or pharmacist if you have questions. COMMON BRAND NAME(S): Paraplatin What should I tell my health care provider before I take this medicine? They need to know if you have any of these conditions:  blood disorders  hearing problems  kidney disease  recent or ongoing radiation therapy  an unusual or allergic reaction to carboplatin, cisplatin, other chemotherapy, other medicines, foods, dyes, or preservatives  pregnant or trying to get pregnant  breast-feeding How should I use this medicine? This drug is usually given as an infusion into a vein. It is administered in a hospital or clinic by a specially trained health care professional. Talk to your pediatrician regarding the use of this medicine in children. Special care may be needed. Overdosage: If you think  you have taken too much of this medicine contact a poison control center or emergency room at once. NOTE: This medicine is only for you. Do not share this medicine with others. What if I miss a dose? It is important not to miss a dose. Call your doctor or health care professional if you are unable to keep an appointment. What may interact with this medicine?  medicines for seizures  medicines to increase blood counts like filgrastim, pegfilgrastim, sargramostim  some antibiotics like amikacin, gentamicin, neomycin, streptomycin, tobramycin  vaccines Talk to your doctor or health care professional before taking any of these medicines:  acetaminophen  aspirin  ibuprofen  ketoprofen  naproxen This list may not describe all possible interactions. Give your health care provider a list of all the medicines, herbs, non-prescription drugs, or dietary supplements you use. Also tell them if you smoke, drink alcohol, or use illegal drugs. Some items may interact with your medicine. What should I watch for while using this medicine? Your condition will be monitored carefully while you are receiving this medicine. You will need important blood work done while you are taking this medicine. This drug may make you feel generally unwell. This is not uncommon, as chemotherapy can affect healthy cells as well as cancer cells. Report any side effects. Continue your course of treatment even though you feel ill unless your doctor tells you to stop. In some cases, you may be given additional medicines to help with side effects. Follow all directions for their use. Call your doctor or health care professional for advice if you get a fever, chills or sore throat, or other symptoms of a cold or flu. Do not treat yourself. This drug decreases your body's ability to fight infections. Try to avoid being around people who are sick. This medicine may increase your risk to bruise or bleed. Call your doctor or health  care professional if you notice any unusual bleeding. Be careful brushing and flossing your teeth or using a toothpick because you may get an infection or bleed more easily. If you have any dental work done, tell your dentist you are receiving this medicine. Avoid taking products that contain aspirin, acetaminophen, ibuprofen, naproxen, or ketoprofen unless instructed by your doctor. These medicines may hide a fever. Do not become pregnant while taking this medicine. Women should inform their doctor if they wish to become pregnant or think they might be pregnant. There is a potential for serious side effects to an unborn child. Talk to your health care professional or pharmacist for more information. Do not breast-feed an infant while taking  this medicine. What side effects may I notice from receiving this medicine? Side effects that you should report to your doctor or health care professional as soon as possible:  allergic reactions like skin rash, itching or hives, swelling of the face, lips, or tongue  signs of infection - fever or chills, cough, sore throat, pain or difficulty passing urine  signs of decreased platelets or bleeding - bruising, pinpoint red spots on the skin, black, tarry stools, nosebleeds  signs of decreased red blood cells - unusually weak or tired, fainting spells, lightheadedness  breathing problems  changes in hearing  changes in vision  chest pain  high blood pressure  low blood counts - This drug may decrease the number of white blood cells, red blood cells and platelets. You may be at increased risk for infections and bleeding.  nausea and vomiting  pain, swelling, redness or irritation at the injection site  pain, tingling, numbness in the hands or feet  problems with balance, talking, walking  trouble passing urine or change in the amount of urine Side effects that usually do not require medical attention (report to your doctor or health care  professional if they continue or are bothersome):  hair loss  loss of appetite  metallic taste in the mouth or changes in taste This list may not describe all possible side effects. Call your doctor for medical advice about side effects. You may report side effects to FDA at 1-800-FDA-1088. Where should I keep my medicine? This drug is given in a hospital or clinic and will not be stored at home. NOTE: This sheet is a summary. It may not cover all possible information. If you have questions about this medicine, talk to your doctor, pharmacist, or health care provider.  2021 [employer]vier/Gold Standard (2007-12-05 14:38:05)

## 2020-10-23 ENCOUNTER — Inpatient Hospital Stay: Payer: 59

## 2020-10-23 ENCOUNTER — Ambulatory Visit
Admission: RE | Admit: 2020-10-23 | Discharge: 2020-10-23 | Disposition: A | Discharge status: Home / Self Care | Payer: 59 | Source: Ambulatory Visit | Attending: Radiation Oncology | Admitting: Radiation Oncology

## 2020-10-23 ENCOUNTER — Other Ambulatory Visit: Payer: Self-pay | Admitting: Radiology

## 2020-10-23 DIAGNOSIS — Z5111 Encounter for antineoplastic chemotherapy: Secondary | ICD-10-CM | POA: Diagnosis not present

## 2020-10-24 ENCOUNTER — Other Ambulatory Visit: Payer: Self-pay

## 2020-10-24 ENCOUNTER — Ambulatory Visit
Admission: RE | Admit: 2020-10-24 | Discharge: 2020-10-24 | Disposition: A | Discharge status: Home / Self Care | Payer: 59 | Source: Ambulatory Visit | Attending: Oncology | Admitting: Oncology

## 2020-10-24 ENCOUNTER — Ambulatory Visit
Admission: RE | Admit: 2020-10-24 | Discharge: 2020-10-24 | Disposition: A | Discharge status: Home / Self Care | Payer: 59 | Source: Ambulatory Visit | Attending: Radiation Oncology | Admitting: Radiation Oncology

## 2020-10-24 DIAGNOSIS — Z87891 Personal history of nicotine dependence: Secondary | ICD-10-CM | POA: Diagnosis not present

## 2020-10-24 DIAGNOSIS — C7492 Malignant neoplasm of unspecified part of left adrenal gland: Secondary | ICD-10-CM | POA: Diagnosis not present

## 2020-10-24 DIAGNOSIS — C3490 Malignant neoplasm of unspecified part of unspecified bronchus or lung: Secondary | ICD-10-CM

## 2020-10-24 DIAGNOSIS — I10 Essential (primary) hypertension: Secondary | ICD-10-CM | POA: Insufficient documentation

## 2020-10-24 DIAGNOSIS — Z79899 Other long term (current) drug therapy: Secondary | ICD-10-CM | POA: Diagnosis not present

## 2020-10-24 DIAGNOSIS — C7951 Secondary malignant neoplasm of bone: Secondary | ICD-10-CM | POA: Insufficient documentation

## 2020-10-24 DIAGNOSIS — E278 Other specified disorders of adrenal gland: Secondary | ICD-10-CM

## 2020-10-24 DIAGNOSIS — M84451D Pathological fracture, right femur, subsequent encounter for fracture with routine healing: Secondary | ICD-10-CM | POA: Insufficient documentation

## 2020-10-24 DIAGNOSIS — F419 Anxiety disorder, unspecified: Secondary | ICD-10-CM | POA: Diagnosis not present

## 2020-10-24 DIAGNOSIS — E279 Disorder of adrenal gland, unspecified: Secondary | ICD-10-CM | POA: Diagnosis present

## 2020-10-24 MED ORDER — FENTANYL CITRATE (PF) 100 MCG/2ML IJ SOLN
INTRAMUSCULAR | Status: AC
  Filled 2020-10-24: qty 2

## 2020-10-24 MED ORDER — MIDAZOLAM HCL 5 MG/5ML IJ SOLN
INTRAMUSCULAR | Status: AC | PRN
  Administered 2020-10-24 (×3): 1 mg via INTRAVENOUS

## 2020-10-24 MED ORDER — SODIUM CHLORIDE 0.9 % IV SOLN: INTRAVENOUS | Status: DC

## 2020-10-24 MED ORDER — FENTANYL CITRATE (PF) 100 MCG/2ML IJ SOLN
INTRAMUSCULAR | Status: AC | PRN
  Administered 2020-10-24 (×3): 50 ug via INTRAVENOUS

## 2020-10-24 MED ORDER — MIDAZOLAM HCL 5 MG/5ML IJ SOLN
INTRAMUSCULAR | Status: AC
  Filled 2020-10-24: qty 5

## 2020-10-24 NOTE — Procedures (Signed)
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  Complications: None immediate.   Ronny Bacon, MD Pager #: [phone]

## 2020-10-24 NOTE — H&P (Addendum)
Chief Complaint: Hypermetabolic adrenal gland. Request is for adrenal gland biopsy  Referring Physician(s): Rao,Archana C  Supervising Physician: Sandi Mariscal  Patient Status: [facility] - Out-pt  History of Present Illness: Gina Sanchez is a 55 y.o. female Smoker. History of HTN, anxiety with recent right femoral head pathological fracture s/p ORIF. 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 PET scan from 1.20.22 shows hypermetabolic bilateral adrenal glands not previously seen on the CT renal stone study from 11.30.19. Team is requesting a left adrenal biopsy for additional molecular studies and possible primary.   Currently without any significant complaints. Patient alert and laying in bed, calm and comfortable. Denies any fevers, headache, chest pain, SOB, cough, abdominal pain, nausea, vomiting or bleeding. Patient states that she is nervous which is to be expected and she had radiation earlier today. Return precautions and treatment recommendations and follow-up discussed with the patient who is agreeable with the plan.   Past Medical History:  Diagnosis Date  . Abnormal Pap smear of cervix   . Anxiety   . Depression   . Essential hypertension   . Femur fracture, right ([place])   . Palpitations   . Precordial chest pain     Past Surgical History:  Procedure Laterality Date  . BREAST BIOPSY Right 2017   benign  . CERVICAL BIOPSY  W/ LOOP ELECTRODE EXCISION    . COLONOSCOPY WITH PROPOFOL N/A 07/03/2015   Procedure: COLONOSCOPY WITH PROPOFOL;  Surgeon: Hulen Luster, MD;  Location: [facility] ENDOSCOPY;  Service: Gastroenterology;  Laterality: N/A;  . ESOPHAGOGASTRODUODENOSCOPY (EGD) WITH PROPOFOL N/A 07/03/2015   Procedure: ESOPHAGOGASTRODUODENOSCOPY (EGD) WITH PROPOFOL;  Surgeon: Hulen Luster, MD;  Location: [facility] ENDOSCOPY;  Service: Gastroenterology;  Laterality: N/A;  . INTRAMEDULLARY (IM) NAIL INTERTROCHANTERIC Right  09/15/2020   Procedure: INTRAMEDULLARY (IM) NAIL INTERTROCHANTRIC;  Surgeon: Corky Mull, MD;  Location: [facility] ORS;  Service: Orthopedics;  Laterality: Right;  . LEFT HEART CATH AND CORONARY ANGIOGRAPHY N/A 03/28/2017   Procedure: Left Heart Cath and Coronary Angiography;  Surgeon: Lorretta Harp, MD;  Location: [place] CV LAB;  Service: Cardiovascular;  Laterality: N/A;    Allergies: Factive [gemifloxacin]  Medications: Prior to Admission medications   Medication Sig Start Date End Date Taking? Authorizing Provider  acetaminophen (TYLENOL) 325 MG tablet Take 1-2 tablets (325-650 mg total) by mouth every 6 (six) hours as needed for mild pain (pain score 1-3 or temp > 100.5). 09/17/20  Yes Wieting, Richard, MD  ibuprofen (ADVIL) 400 MG tablet Take 400 mg by mouth every 4 (four) hours as needed.   Yes [provider]  LORazepam (ATIVAN) 0.5 MG tablet Take 1 tablet (0.5 mg total) by mouth every 6 (six) hours as needed for anxiety. 09/26/20  Yes Sindy Guadeloupe, MD  metoprolol succinate (TOPROL-XL) 25 MG 24 hr tablet Take 25 mg by mouth daily.   Yes [provider]  omeprazole (PRILOSEC) 20 MG capsule Take 1 capsule by mouth every morning.   Yes [provider]  oxyCODONE (OXY IR/ROXICODONE) 5 MG immediate release tablet Take 1-2 tablets (5-10 mg total) by mouth every 4 (four) hours as needed for moderate pain. 10/15/20  Yes Sindy Guadeloupe, MD  polyethylene glycol (MIRALAX) 17 g packet Take 17 g by mouth daily as needed for moderate constipation. 09/17/20  Yes Wieting, Richard, MD  prochlorperazine (COMPAZINE) 10 MG tablet Take 1 tablet (10 mg total) by mouth every 6 (six) hours as needed (Nausea  or vomiting). 10/19/20  Yes Sindy Guadeloupe, MD  dexamethasone (DECADRON) 4 MG tablet Take 1 tab two times a day the day before Alimta chemo, then take 2 tabs once a day for 3 days starting the day after cisplatin. 10/19/20   Sindy Guadeloupe, MD  folic acid (FOLVITE) 1 MG tablet Take 1  tablet (1 mg total) by mouth daily. Start 5-7 days before Alimta chemotherapy. Continue until 21 days after Alimta completed. 10/19/20   Sindy Guadeloupe, MD  lidocaine-prilocaine (EMLA) cream Apply to affected area once 10/19/20   Sindy Guadeloupe, MD  ondansetron (ZOFRAN) 4 MG tablet Take 4 mg by mouth every 8 (eight) hours as needed for nausea or vomiting. Patient not taking: Reported on 10/16/2020    [provider]     Family History  Problem Relation Age of Onset  . Diabetes Mother        alive @ 31  . Goiter Mother   . Heart disease Father        died of MI @ 81  . Osteoporosis Maternal Grandmother   . Colon cancer Maternal Grandfather   . Breast cancer Neg Hx   . Ovarian cancer Neg Hx     Social History   Socioeconomic History  . Marital status: Married    Spouse name: Not on file  . Number of children: Not on file  . Years of education: Not on file  . Highest education level: Not on file  Occupational History  . Not on file  Tobacco Use  . Smoking status: Former Smoker    Packs/day: 1.00    Years: 30.00    Pack years: 30.00    Quit date: 09/17/2020    Years since quitting: 0.1  . Smokeless tobacco: Never Used  Vaping Use  . Vaping Use: Never used  Substance and Sexual Activity  . Alcohol use: Not Currently    Alcohol/week: 0.0 standard drinks  . Drug use: No  . Sexual activity: Yes    Birth control/protection: I.U.D., Post-menopausal  Other Topics Concern  . Not on file  Social History Narrative   Lives in [place] with her husband.  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   Social Determinants of Health   Financial Resource Strain: Not on file  Food Insecurity: Not on file  Transportation Needs: Not on file  Physical Activity: Not on file  Stress: Not on file  Social Connections: Not on file    Review of Systems: A 12 point ROS discussed and pertinent positives are indicated in the HPI above.  All other  systems are negative.  Review of Systems  Constitutional: Negative for fatigue and fever.  HENT: Negative for congestion.   Respiratory: Negative for cough and shortness of breath.   Gastrointestinal: Negative for abdominal pain, diarrhea, nausea and vomiting.    Vital Signs: BP (!) 128/100   Pulse 76   Temp 98.6 F (37 C) (Oral)   Resp 17   Ht 5' 7"  (1.702 m)   Wt 180 lb (81.6 kg)   LMP 09/15/2018   SpO2 95%   BMI 28.19 kg/m   Physical Exam Vitals and nursing note reviewed.  Constitutional:      Appearance: She is well-developed.  HENT:     Head: Normocephalic and atraumatic.  Eyes:     Conjunctiva/sclera: Conjunctivae normal.  Cardiovascular:     Rate and Rhythm: Normal rate and regular rhythm.  Heart sounds: Normal heart sounds.  Pulmonary:     Effort: Pulmonary effort is normal.     Breath sounds: Normal breath sounds.  Musculoskeletal:        General: Normal range of motion.     Cervical back: Normal range of motion.  Skin:    General: Skin is warm.  Neurological:     Mental Status: She is alert and oriented to person, place, and time.     Imaging: MR Brain W Wo Contrast  Result Date: 10/01/2020 CLINICAL DATA:  Metastatic disease evaluation.  Femur malignancy. EXAM: MRI HEAD WITHOUT AND WITH CONTRAST TECHNIQUE: Multiplanar, multiecho pulse sequences of the brain and surrounding structures were obtained without and with intravenous contrast. CONTRAST:  39m GADAVIST GADOBUTROL 1 MMOL/ML IV SOLN COMPARISON:  None. FINDINGS: Brain: No acute infarction, hemorrhage, hydrocephalus, or extra-axial fluid collection. There are two punctate foci of enhancement in the right frontal lobe (series 15, image 11 and series 14, images 31 and 34). No associated edema or mass effect. Vascular: Major arterial flow voids are maintained at the skull base. Skull and upper cervical spine: Normal marrow signal. Sinuses/Orbits: Sinuses are largely clear.  Unremarkable orbits. Other: No  mastoid effusions. IMPRESSION: Two punctate foci of enhancement in the right frontal lobe (series 15, image 11 and series 14, images 31 and 34), which are concerning for metastases given the clinical history. No substantial edema or mass effect. These results will be called to the ordering clinician or representative by the Radiologist Assistant, and communication documented in the PACS or [employer] Electronically Signed   By: FMargaretha SheffieldMD   On: 10/01/2020 12:08   NM PET Image Initial (PI) Skull Base To Thigh  Result Date: 10/02/2020 CLINICAL DATA:  Initial treatment strategy for metastatic poorly differentiated carcinoma. EXAM: NUCLEAR MEDICINE PET SKULL BASE TO THIGH TECHNIQUE: 10.02 mCi F-18 FDG was injected intravenously. Full-ring PET imaging was performed from the skull base to thigh after the radiotracer. CT data was obtained and used for attenuation correction and anatomic localization. Fasting blood glucose: 122 mg/dl COMPARISON:  None FINDINGS: Mediastinal blood pool activity: SUV max 3.2 Liver activity: SUV max NA NECK: FDG avid left level 3 lymph node measures 0.6 cm and has an SUV max of 5.38, image 46/3. Left level 4 lymph node measures 0.9 cm and has an SUV max of 6.26, image 63/3. Incidental CT findings: none CHEST: Bilateral FDG avid mediastinal lymph nodes: Right pre-vascular node measures 0.8 cm with SUV max of 7.13, image 91/3. Right paratracheal lymph node measures 1.6 cm with SUV max of 14.67, image 83/3. Left paratracheal lymph node measures 1.2 cm within SUV max of 6.06, image 82/3. Low right paratracheal lymph node at the level of the carina measures 0.7 cm within SUV max of 4.58, image 90/3. No FDG avid axillary, right supraclavicular, or hilar lymph nodes. 5 mm right middle lobe lung nodule is too small to characterize, image 109/3. Incidental CT findings: none ABDOMEN/PELVIS: No abnormal FDG uptake within the liver, pancreas, or spleen. FDG avid bilateral adrenal  metastasis: -left adrenal lesion measures 2.4 by 4.1 cm and has an SUV max of 9.9, image 145/3. Right adrenal lesion measures 1.1 x 1.7 cm and has an SUV max of 10.23. Porta hepatic lymph node is FDG avid measuring 9.17. This is difficult to visualize on the corresponding CT images. Incidental CT findings: Aortic atherosclerosis.  No aneurysm. SKELETON: Lytic lesion involving the left iliac wing has an SUV max of 9.99,  image 230/3. Postoperative change from ORIF of proximal right femur pathologic fracture. Intense FDG uptake is identified in this area with an SUV max of 19.01. Incidental CT findings: none IMPRESSION: 1. Site of primary neoplasm not confidently identified. 2. Evidence of left level 3 and level 4 FDG avid cervical nodal metastasis. There is also FDG avid bilateral mediastinal nodal metastasis. Single node within the porta hepatic region is FDG avid and also worrisome for metastatic disease. 3. Bilateral FDG avid adrenal gland metastasis. 4. FDG avid left iliac wing lytic lesion consistent with osseous metastasis. Known pathologic fracture with underlying FDG avid tumor is associated with the proximal right femur. The patient has undergone ORIF of the pathologic fracture. 5.  Aortic Atherosclerosis (ICD10-I70.0). Electronically Signed   By: Kerby Moors M.D.   On: 10/02/2020 11:59   [place] CORE BIOPSY (LYMPH NODES)  Result Date: 10/07/2020 INDICATION: Left supraclavicular lymph node biopsy EXAM: ULTRASOUND GUIDED CORE BIOPSY OF LEFT SUPRACLAVICULAR LYMPH NODE MEDICATIONS: None. ANESTHESIA/SEDATION: NONE PROCEDURE: The procedure, risks, benefits, and alternatives were explained to the patient. Questions regarding the procedure were encouraged and answered. The patient understands and consents to the procedure. The operative field was prepped with chlorhexidine in a sterile fashion, and a sterile drape was applied covering the operative field. A sterile gown and sterile gloves were used for the  procedure. Local anesthesia was provided with 1% Lidocaine. Sterile ultrasound probe cover and gel utilized throughout the procedure. Ultrasound evaluation of the left neck demonstrated mildly enlarged morphologically abnormal left neck lymph nodes. Following local lidocaine administration, 18 gauge Medax biopsy needle was utilized to obtained 4 cores from the left supraclavicular lymph node. Samples were sent to pathology in sterile saline. COMPLICATIONS: None immediate. FINDINGS: Morphologically abnormal mildly enlarged left supraclavicular lymph nodes. IMPRESSION: Ultrasound-guided biopsy left supraclavicular lymph node as above. Biopsy was difficult due to small size of the lymph nodes and proximity to vessels. Electronically Signed   By: Miachel Roux M.D.   On: 10/07/2020 15:22    Labs:  CBC: Recent Labs    09/15/20 0322 09/16/20 0530 09/17/20 0444 10/22/20 1048  WBC 15.6* 13.1* 10.6* 10.4  HGB 13.2 12.3 10.7* 13.6  HCT 39.2 37.2 32.6* 40.3  PLT 305 289 269 347    COAGS: Recent Labs    09/14/20 1914  INR 1.1    BMP: Recent Labs    09/14/20 1914 09/15/20 0322 09/15/20 1819 09/16/20 0530 09/17/20 0444  NA 137 137  --  138 139  K 3.7 3.5  --  4.2 3.7  CL 103 104  --  106 107  CO2 24 24  --  23 24  GLUCOSE 118* 105*  --  141* 96  BUN 15 17  --  16 15  CALCIUM 9.1 8.7*  --  8.6* 8.4*  CREATININE 0.63 0.63 0.60 0.61 0.60  GFRNONAA >60 >60 >60 >60 >60    LIVER FUNCTION TESTS: Recent Labs    09/14/20 1914  BILITOT 1.0  AST 16  ALT 16  ALKPHOS 84  PROT 7.0  ALBUMIN 3.6    TUMOR MARKERS: No results for input(s): AFPTM, CEA, CA199, CHROMGRNA in the last 8760 hours.  Assessment and Plan:  14 y. female outpatient. Smoker. History of HTN, anxiety with recent right femoral head pathological fracture s/p ORIF. 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 PET scan from 1.20.22 shows hypermetabolic bilateral  adrenal glands not previously seen on the CT renal stone study  from 11.30.19. Team is requesting a left adrenal biopsy for additional molecular studies and possible primary.   IR previously performed a left supraclavicular lymph node biopsy. Cytology resulted in metastatic malignancy. Labs from 2.8.22 and medications are within acceptable parameters. No pertinent allergies. Patient has been NPO since midnight.   Risks and benefits of left adrenal gland biopsy was discussed with the patient and/or patient's family including, but not limited to bleeding, infection, damage to adjacent structures or low yield requiring additional tests.  All of the questions were answered and there is agreement to proceed.  Consent signed and in chart.   Thank you for this interesting consult.  I greatly enjoyed meeting JANELLE SPELLMAN and look forward to participating in their care.  A copy of this report was sent to the requesting provider on this date.  Electronically Signed: Jacqualine Mau, NP 10/24/2020, 11:11 AM   I spent a total of  30 Minutes   in face to face in clinical consultation, greater than 50% of which was counseling/coordinating care for left adrenal gland biopsy

## 2020-10-26 ENCOUNTER — Other Ambulatory Visit: Payer: Self-pay | Admitting: *Deleted

## 2020-10-26 DIAGNOSIS — C3491 Malignant neoplasm of unspecified part of right bronchus or lung: Secondary | ICD-10-CM

## 2020-10-27 ENCOUNTER — Inpatient Hospital Stay: Payer: 59

## 2020-10-27 ENCOUNTER — Other Ambulatory Visit: Payer: Self-pay | Admitting: *Deleted

## 2020-10-27 ENCOUNTER — Encounter: Payer: Self-pay | Admitting: Oncology

## 2020-10-27 ENCOUNTER — Inpatient Hospital Stay (HOSPITAL_BASED_OUTPATIENT_CLINIC_OR_DEPARTMENT_OTHER): Payer: 59 | Admitting: Oncology

## 2020-10-27 ENCOUNTER — Encounter: Payer: Self-pay | Admitting: *Deleted

## 2020-10-27 ENCOUNTER — Ambulatory Visit
Admission: RE | Admit: 2020-10-27 | Discharge: 2020-10-27 | Disposition: A | Discharge status: Home / Self Care | Payer: 59 | Source: Ambulatory Visit | Attending: Radiation Oncology | Admitting: Radiation Oncology

## 2020-10-27 VITALS — BP 131/90 | HR 83 | Temp 97.1°F | Resp 20 | Wt 181.3 lb

## 2020-10-27 DIAGNOSIS — C3491 Malignant neoplasm of unspecified part of right bronchus or lung: Secondary | ICD-10-CM | POA: Diagnosis not present

## 2020-10-27 DIAGNOSIS — G893 Neoplasm related pain (acute) (chronic): Secondary | ICD-10-CM

## 2020-10-27 DIAGNOSIS — Z5111 Encounter for antineoplastic chemotherapy: Secondary | ICD-10-CM | POA: Diagnosis not present

## 2020-10-27 DIAGNOSIS — F419 Anxiety disorder, unspecified: Secondary | ICD-10-CM | POA: Diagnosis not present

## 2020-10-27 DIAGNOSIS — C3490 Malignant neoplasm of unspecified part of unspecified bronchus or lung: Secondary | ICD-10-CM

## 2020-10-27 LAB — CBC WITH DIFFERENTIAL/PLATELET
Abs Immature Granulocytes: 0.09 10*3/uL — ABNORMAL HIGH (ref 0.00–0.07)
Basophils Absolute: 0 10*3/uL (ref 0.0–0.1)
Basophils Relative: 0 %
Eosinophils Absolute: 0 10*3/uL (ref 0.0–0.5)
Eosinophils Relative: 0 %
HCT: 39.8 % (ref 36.0–46.0)
Hemoglobin: 13.4 g/dL (ref 12.0–15.0)
Immature Granulocytes: 1 %
Lymphocytes Relative: 7 %
Lymphs Abs: 1.1 10*3/uL (ref 0.7–4.0)
MCH: 28.8 pg (ref 26.0–34.0)
MCHC: 33.7 g/dL (ref 30.0–36.0)
MCV: 85.6 fL (ref 80.0–100.0)
Monocytes Absolute: 0.7 10*3/uL (ref 0.1–1.0)
Monocytes Relative: 5 %
Neutro Abs: 13 10*3/uL — ABNORMAL HIGH (ref 1.7–7.7)
Neutrophils Relative %: 87 %
Platelets: 373 10*3/uL (ref 150–400)
RBC: 4.65 MIL/uL (ref 3.87–5.11)
RDW: 13.5 % (ref 11.5–15.5)
WBC: 14.9 10*3/uL — ABNORMAL HIGH (ref 4.0–10.5)
nRBC: 0 % (ref 0.0–0.2)

## 2020-10-27 LAB — COMPREHENSIVE METABOLIC PANEL
ALT: 18 U/L (ref 0–44)
AST: 23 U/L (ref 15–41)
Albumin: 3.8 g/dL (ref 3.5–5.0)
Alkaline Phosphatase: 115 U/L (ref 38–126)
Anion gap: 15 (ref 5–15)
BUN: 15 mg/dL (ref 6–20)
CO2: 17 mmol/L — ABNORMAL LOW (ref 22–32)
Calcium: 9.4 mg/dL (ref 8.9–10.3)
Chloride: 104 mmol/L (ref 98–111)
Creatinine, Ser: 0.71 mg/dL (ref 0.44–1.00)
GFR, Estimated: >60 mL/min (ref >60)
Glucose, Bld: 184 mg/dL — ABNORMAL HIGH (ref 70–99)
Potassium: 3.7 mmol/L (ref 3.5–5.1)
Sodium: 136 mmol/L (ref 135–145)
Total Bilirubin: 0.6 mg/dL (ref 0.3–1.2)
Total Protein: 7.6 g/dL (ref 6.5–8.1)

## 2020-10-27 LAB — HEPATITIS B SURFACE ANTIGEN: Hepatitis B Surface Ag: NONREACTIVE

## 2020-10-27 LAB — HEPATITIS B CORE ANTIBODY, TOTAL: Hep B Core Total Ab: NONREACTIVE

## 2020-10-27 LAB — SURGICAL PATHOLOGY

## 2020-10-27 MED ORDER — CARBOPLATIN CHEMO INJECTION 600 MG/60ML: 750.0000 mg | Freq: Once | INTRAVENOUS | Status: DC

## 2020-10-27 MED ORDER — SODIUM CHLORIDE 0.9 % IV SOLN
150.0000 mg | Freq: Once | INTRAVENOUS | Status: AC
  Administered 2020-10-27: 150 mg via INTRAVENOUS
  Filled 2020-10-27: qty 150

## 2020-10-27 MED ORDER — ALPRAZOLAM 0.5 MG PO TABS: 0.5000 mg | ORAL_TABLET | Freq: Three times a day (TID) | ORAL | 0 refills | Status: DC | PRN

## 2020-10-27 MED ORDER — OXYCODONE HCL 5 MG PO TABS: 5.0000 mg | ORAL_TABLET | ORAL | 0 refills | Status: DC | PRN

## 2020-10-27 MED ORDER — SODIUM CHLORIDE 0.9 % IV SOLN
Freq: Once | INTRAVENOUS | Status: AC
  Filled 2020-10-27: qty 250

## 2020-10-27 MED ORDER — SODIUM CHLORIDE 0.9 % IV SOLN
500.0000 mg/m2 | Freq: Once | INTRAVENOUS | Status: AC
  Administered 2020-10-27: 1000 mg via INTRAVENOUS
  Filled 2020-10-27: qty 40

## 2020-10-27 MED ORDER — CYANOCOBALAMIN 1000 MCG/ML IJ SOLN
1000.0000 ug | Freq: Once | INTRAMUSCULAR | Status: AC
  Administered 2020-10-27: 1000 ug via INTRAMUSCULAR
  Filled 2020-10-27: qty 1

## 2020-10-27 MED ORDER — SODIUM CHLORIDE 0.9 % IV SOLN
650.0000 mg | Freq: Once | INTRAVENOUS | Status: AC
  Administered 2020-10-27: 650 mg via INTRAVENOUS
  Filled 2020-10-27: qty 65

## 2020-10-27 MED ORDER — PALONOSETRON HCL INJECTION 0.25 MG/5ML
0.2500 mg | Freq: Once | INTRAVENOUS | Status: AC
  Administered 2020-10-27: 0.25 mg via INTRAVENOUS
  Filled 2020-10-27: qty 5

## 2020-10-27 MED ORDER — SODIUM CHLORIDE 0.9 % IV SOLN
10.0000 mg | Freq: Once | INTRAVENOUS | Status: AC
  Administered 2020-10-27: 10 mg via INTRAVENOUS
  Filled 2020-10-27: qty 10

## 2020-10-27 NOTE — Progress Notes (Signed)
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 She underwent operative fixation on 10/12/2020. MRI of femur showed heterogeneously enhancing osseous lesion within the area which would be nonspecific versus office neoplasm or metastatic lesion. CT chest abdomen and pelvis with contrast showed an enlarged pretracheal lymph node 2.2 x 1.5 cm and a right paratracheal lymph node measuring 2.7 x 1.3 cm. Prevascular node measuring 0.3 x 0.9 cm. 5 x 4 mm right middle lobe nodule. 2.8 x 1.9 cm left adrenal lesion.  Reamings from the right femur showed metastatic poorly differentiated carcinoma. Immunohistochemistry showed was positive for pancytokeratin, CK7 and patchy CK20 with patchy dim expression of TTF-1. Cells negative for Melan-A, CDX2, PAX8, Napsin A, GATA3, p40, CD56, p16 and thyroglobulin. Findings compatible with metastatic carcinoma but because of decalcification immunohistochemical staining is unreliable. Patchy dim staining with TTF-1 suspicious for lung primary but not a definitive diagnosis.  Repeat supraclavicular lymph node biopsy showed metastatic adenocarcinoma.   Tumor cells positive for CK7 with focal weak staining for TTF-1.  Suggestive of lung origin in the proper clinical context.  Cells were negative for GATA3 PAX8 CDX2 and CK20 and Napsin A.  Foundation 1 liquid biopsy did not show any evidence of actionable mutations.   Interval history-patient will be completing radiation to her right hip tomorrow.  She uses oxycodone sparingly.  Reports that lorazepam does not help her much with anxiety and alprazolam has helped her in the past.  She has a second opinion coming up with [facility] in a few days.  ECOG PS- 1 Pain scale- 3 Opioid associated constipation- no  Review of systems- Review of Systems  Constitutional: Negative for chills, fever, malaise/fatigue and weight loss.  HENT: Negative for congestion, ear discharge and nosebleeds.   Eyes: Negative for blurred vision.  Respiratory: Negative for cough, hemoptysis, sputum production, shortness of breath and wheezing.   Cardiovascular: Negative for chest pain, palpitations, orthopnea and claudication.  Gastrointestinal: Negative for abdominal pain, blood in stool, constipation, diarrhea, heartburn, melena, nausea and vomiting.  Genitourinary: Negative for dysuria, flank pain, frequency, hematuria and urgency.  Musculoskeletal: Positive for joint pain (Right hip pain). Negative for back pain and myalgias.  Skin: Negative for rash.  Neurological: Negative for dizziness, tingling, focal weakness, seizures, weakness and headaches.  Endo/Heme/Allergies: Does not bruise/bleed easily.  Psychiatric/Behavioral: Negative for depression and suicidal ideas. The patient does not have insomnia.        Allergies  Allergen Reactions  . Factive [Gemifloxacin] Rash     Past Medical History:  Diagnosis Date  . Abnormal Pap smear of cervix   . Anxiety   . Depression   . Essential hypertension   . Femur fracture, right ([place])   . Palpitations   . Precordial chest pain  Past Surgical History:   Procedure Laterality Date  . BREAST BIOPSY Right 2017   benign  . CERVICAL BIOPSY  W/ LOOP ELECTRODE EXCISION    . COLONOSCOPY WITH PROPOFOL N/A 07/03/2015   Procedure: COLONOSCOPY WITH PROPOFOL;  Surgeon: Hulen Luster, MD;  Location: [facility] ENDOSCOPY;  Service: Gastroenterology;  Laterality: N/A;  . ESOPHAGOGASTRODUODENOSCOPY (EGD) WITH PROPOFOL N/A 07/03/2015   Procedure: ESOPHAGOGASTRODUODENOSCOPY (EGD) WITH PROPOFOL;  Surgeon: Hulen Luster, MD;  Location: [facility] ENDOSCOPY;  Service: Gastroenterology;  Laterality: N/A;  . INTRAMEDULLARY (IM) NAIL INTERTROCHANTERIC Right 09/15/2020   Procedure: INTRAMEDULLARY (IM) NAIL INTERTROCHANTRIC;  Surgeon: Corky Mull, MD;  Location: [facility] ORS;  Service: Orthopedics;  Laterality: Right;  . LEFT HEART CATH AND CORONARY ANGIOGRAPHY N/A 03/28/2017   Procedure: Left Heart Cath and Coronary Angiography;  Surgeon: Lorretta Harp, MD;  Location: [place] CV LAB;  Service: Cardiovascular;  Laterality: N/A;    Social History   Socioeconomic History  . Marital status: Married    Spouse name: Not on file  . Number of children: Not on file  . Years of education: Not on file  . Highest education level: Not on file  Occupational History  . Not on file  Tobacco Use  . Smoking status: Former Smoker    Packs/day: 1.00    Years: 30.00    Pack years: 30.00    Quit date: 09/17/2020    Years since quitting: 0.1  . Smokeless tobacco: Never Used  Vaping Use  . Vaping Use: Never used  Substance and Sexual Activity  . Alcohol use: Not Currently    Alcohol/week: 0.0 standard drinks  . Drug use: No  . Sexual activity: Yes    Birth control/protection: I.U.D., Post-menopausal  Other Topics Concern  . Not on file  Social History Narrative   Lives in [place] with her husband.  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   Social Determinants of Health   Financial Resource Strain: Not on file  Food Insecurity: Not on file   Transportation Needs: Not on file  Physical Activity: Not on file  Stress: Not on file  Social Connections: Not on file  Intimate Partner Violence: Not on file    Family History  Problem Relation Age of Onset  . Diabetes Mother        alive @ 37  . Goiter Mother   . Heart disease Father        died of MI @ 48  . Osteoporosis Maternal Grandmother   . Colon cancer Maternal Grandfather   . Breast cancer Neg Hx   . Ovarian cancer Neg Hx      Current Outpatient Medications:  .  acetaminophen (TYLENOL) 325 MG tablet, Take 1-2 tablets (325-650 mg total) by mouth every 6 (six) hours as needed for mild pain (pain score 1-3 or temp > 100.5)., Disp: , Rfl:  .  dexamethasone (DECADRON) 4 MG tablet, Take 1 tab two times a day the day before Alimta chemo, then take 2 tabs once a day for 3 days starting the day after cisplatin., Disp: 30 tablet, Rfl: 1 .  folic acid (FOLVITE) 1 MG tablet, Take 1 tablet (1 mg total) by mouth daily. Start 5-7 days before Alimta chemotherapy. Continue until 21 days after Alimta completed., Disp: 100 tablet, Rfl: 3 .  ibuprofen (ADVIL) 400 MG tablet, Take 400 mg by mouth every 4 (four) hours as needed., Disp: , Rfl:  .  lidocaine-prilocaine (EMLA) cream, Apply to affected area once, Disp: 30 g, Rfl: 3 .  LORazepam (ATIVAN) 0.5 MG tablet, Take 1 tablet (0.5 mg total) by mouth every 6 (six) hours as needed for anxiety., Disp: 60 tablet, Rfl: 0 .  metoprolol succinate (TOPROL-XL) 25 MG 24 hr tablet, Take 25 mg by mouth daily., Disp: , Rfl:  .  omeprazole (PRILOSEC) 20 MG capsule, Take 1 capsule by mouth every morning., Disp: , Rfl:  .  polyethylene glycol (MIRALAX) 17 g packet, Take 17 g by mouth daily as needed for moderate constipation., Disp: 30 each, Rfl: 0 .  prochlorperazine (COMPAZINE) 10 MG tablet, Take 1 tablet (10 mg total) by mouth every 6 (six) hours as needed (Nausea or vomiting)., Disp: 30 tablet, Rfl: 1 .  ALPRAZolam (XANAX) 0.5 MG tablet, Take 1 tablet  (0.5 mg total) by mouth every 8 (eight) hours as needed for anxiety., Disp: 30 tablet, Rfl: 0 .  ondansetron (ZOFRAN) 4 MG tablet, Take 4 mg by mouth every 8 (eight) hours as needed for nausea or vomiting. (Patient not taking: No sig reported), Disp: , Rfl:  .  oxyCODONE (OXY IR/ROXICODONE) 5 MG immediate release tablet, Take 1-2 tablets (5-10 mg total) by mouth every 4 (four) hours as needed for moderate pain., Disp: 60 tablet, Rfl: 0  Physical exam:  Vitals:   10/27/20 0949  BP: 131/90  Pulse: 83  Resp: 20  Temp: (!) 97.1 F (36.2 C)  TempSrc: Tympanic  SpO2: 100%  Weight: 181 lb 4.8 oz (82.2 kg)   Physical Exam Constitutional:      Comments: Ambulates with a walker.  Appears in no acute distress  Eyes:     Extraocular Movements: EOM normal.  Cardiovascular:     Rate and Rhythm: Normal rate and regular rhythm.     Heart sounds: Normal heart sounds.  Pulmonary:     Effort: Pulmonary effort is normal.     Breath sounds: Normal breath sounds.  Skin:    General: Skin is warm and dry.  Neurological:     Mental Status: She is alert and oriented to person, place, and time.      CMP Latest Ref Rng & Units 10/27/2020  Glucose 70 - 99 mg/dL 184(H)  BUN 6 - 20 mg/dL 15  Creatinine 0.44 - 1.00 mg/dL 0.71  Sodium 135 - 145 mmol/L 136  Potassium 3.5 - 5.1 mmol/L 3.7  Chloride 98 - 111 mmol/L 104  CO2 22 - 32 mmol/L 17(L)  Calcium 8.9 - 10.3 mg/dL 9.4  Total Protein 6.5 - 8.1 g/dL 7.6  Total Bilirubin 0.3 - 1.2 mg/dL 0.6  Alkaline Phos 38 - 126 U/L 115  AST 15 - 41 U/L 23  ALT 0 - 44 U/L 18   CBC Latest Ref Rng & Units 10/27/2020  WBC 4.0 - 10.5 K/uL 14.9(H)  Hemoglobin 12.0 - 15.0 g/dL 13.4  Hematocrit 36.0 - 46.0 % 39.8  Platelets 150 - 400 K/uL 373    No images are attached to the encounter.  MR Brain W Wo Contrast  Result Date: 10/01/2020 CLINICAL DATA:  Metastatic disease evaluation.  Femur malignancy. EXAM: MRI HEAD WITHOUT AND WITH CONTRAST TECHNIQUE: Multiplanar,  multiecho pulse sequences of the brain and surrounding structures were obtained without and with intravenous contrast. CONTRAST:  72m GADAVIST GADOBUTROL 1 MMOL/ML IV SOLN COMPARISON:  None. FINDINGS: Brain: No acute infarction, hemorrhage, hydrocephalus, or extra-axial fluid collection. There are two punctate foci of enhancement in the right frontal lobe (  series 15, image 11 and series 14, images 31 and 34). No associated edema or mass effect. Vascular: Major arterial flow voids are maintained at the skull base. Skull and upper cervical spine: Normal marrow signal. Sinuses/Orbits: Sinuses are largely clear.  Unremarkable orbits. Other: No mastoid effusions. IMPRESSION: Two punctate foci of enhancement in the right frontal lobe (series 15, image 11 and series 14, images 31 and 34), which are concerning for metastases given the clinical history. No substantial edema or mass effect. These results will be called to the ordering clinician or representative by the Radiologist Assistant, and communication documented in the PACS or [employer]. Electronically Signed   By: Margaretha Sheffield MD   On: 10/01/2020 12:08   NM PET Image Initial (PI) Skull Base To Thigh  Result Date: 10/02/2020 CLINICAL DATA:  Initial treatment strategy for metastatic poorly differentiated carcinoma. EXAM: NUCLEAR MEDICINE PET SKULL BASE TO THIGH TECHNIQUE: 10.02 mCi F-18 FDG was injected intravenously. Full-ring PET imaging was performed from the skull base to thigh after the radiotracer. CT data was obtained and used for attenuation correction and anatomic localization. Fasting blood glucose: 122 mg/dl COMPARISON:  None FINDINGS: Mediastinal blood pool activity: SUV max 3.2 Liver activity: SUV max NA NECK: FDG avid left level 3 lymph node measures 0.6 cm and has an SUV max of 5.38, image 46/3. Left level 4 lymph node measures 0.9 cm and has an SUV max of 6.26, image 63/3. Incidental CT findings: none CHEST: Bilateral FDG avid  mediastinal lymph nodes: Right pre-vascular node measures 0.8 cm with SUV max of 7.13, image 91/3. Right paratracheal lymph node measures 1.6 cm with SUV max of 14.67, image 83/3. Left paratracheal lymph node measures 1.2 cm within SUV max of 6.06, image 82/3. Low right paratracheal lymph node at the level of the carina measures 0.7 cm within SUV max of 4.58, image 90/3. No FDG avid axillary, right supraclavicular, or hilar lymph nodes. 5 mm right middle lobe lung nodule is too small to characterize, image 109/3. Incidental CT findings: none ABDOMEN/PELVIS: No abnormal FDG uptake within the liver, pancreas, or spleen. FDG avid bilateral adrenal metastasis: -left adrenal lesion measures 2.4 by 4.1 cm and has an SUV max of 9.9, image 145/3. Right adrenal lesion measures 1.1 x 1.7 cm and has an SUV max of 10.23. Porta hepatic lymph node is FDG avid measuring 9.17. This is difficult to visualize on the corresponding CT images. Incidental CT findings: Aortic atherosclerosis.  No aneurysm. SKELETON: Lytic lesion involving the left iliac wing has an SUV max of 9.99, image 230/3. Postoperative change from ORIF of proximal right femur pathologic fracture. Intense FDG uptake is identified in this area with an SUV max of 19.01. Incidental CT findings: none IMPRESSION: 1. Site of primary neoplasm not confidently identified. 2. Evidence of left level 3 and level 4 FDG avid cervical nodal metastasis. There is also FDG avid bilateral mediastinal nodal metastasis. Single node within the porta hepatic region is FDG avid and also worrisome for metastatic disease. 3. Bilateral FDG avid adrenal gland metastasis. 4. FDG avid left iliac wing lytic lesion consistent with osseous metastasis. Known pathologic fracture with underlying FDG avid tumor is associated with the proximal right femur. The patient has undergone ORIF of the pathologic fracture. 5.  Aortic Atherosclerosis (ICD10-I70.0). Electronically Signed   By: Kerby Moors M.D.    On: 10/02/2020 11:59   CT BIOPSY  Result Date: 10/24/2020 INDICATION: History of poorly differentiated carcinoma. Please perform left adrenal gland  nodule biopsy for the acquisition of additional tissue for molecular testing. EXAM: CT-GUIDED LEFT ADRENAL GLAND BIOPSY COMPARISON:  PET-CT-10/02/2010 MEDICATIONS: None. ANESTHESIA/SEDATION: Fentanyl 150 mcg IV; Versed 3 mg IV Sedation time: 50 minutes; The patient was continuously monitored during the procedure by the interventional radiology nurse under my direct supervision. CONTRAST:  None. COMPLICATIONS: None immediate. PROCEDURE: Informed consent was obtained from the patient following an explanation of the procedure, risks, benefits and alternatives. A time out was performed prior to the initiation of the procedure. The patient was positioned initially right lateral decubitus and ultimately prone on the CT table and a limited CT was performed for procedural planning demonstrating unchanged masslike thickening involving primarily the medial limb of the left adrenal gland with dominant component measuring approximately 4.8 x 1.6 cm (image 14, series 5). The procedure was planned. The operative site was prepped and draped in the usual sterile fashion. Appropriate trajectory was confirmed with a 22 gauge spinal needle after the adjacent tissues were anesthetized with 1% Lidocaine with epinephrine. Under intermittent CT guidance, a 17 gauge coaxial needle was advanced into the peripheral aspect of the masslike thickening involving the medial limb of the left adrenal gland. Appropriate positioning was confirmed and 8 core needle biopsy samples were obtained with an 18 gauge core needle biopsy device. The co-axial needle was removed following administration of a Gel-Foam slurry and superficial hemostasis was achieved with manual compression. A limited postprocedural CT was negative for hemorrhage or additional complication. A dressing was placed. The patient  tolerated the procedure well without immediate postprocedural complication. IMPRESSION: Technically successful CT guided core needle biopsy of masslike thickening involving the medial limb of the left adrenal gland. Electronically Signed   By: Sandi Mariscal M.D.   On: 10/24/2020 14:00   [place] CORE BIOPSY (LYMPH NODES)  Result Date: 10/07/2020 INDICATION: Left supraclavicular lymph node biopsy EXAM: ULTRASOUND GUIDED CORE BIOPSY OF LEFT SUPRACLAVICULAR LYMPH NODE MEDICATIONS: None. ANESTHESIA/SEDATION: NONE PROCEDURE: The procedure, risks, benefits, and alternatives were explained to the patient. Questions regarding the procedure were encouraged and answered. The patient understands and consents to the procedure. The operative field was prepped with chlorhexidine in a sterile fashion, and a sterile drape was applied covering the operative field. A sterile gown and sterile gloves were used for the procedure. Local anesthesia was provided with 1% Lidocaine. Sterile ultrasound probe cover and gel utilized throughout the procedure. Ultrasound evaluation of the left neck demonstrated mildly enlarged morphologically abnormal left neck lymph nodes. Following local lidocaine administration, 18 gauge Medax biopsy needle was utilized to obtained 4 cores from the left supraclavicular lymph node. Samples were sent to pathology in sterile saline. COMPLICATIONS: None immediate. FINDINGS: Morphologically abnormal mildly enlarged left supraclavicular lymph nodes. IMPRESSION: Ultrasound-guided biopsy left supraclavicular lymph node as above. Biopsy was difficult due to small size of the lymph nodes and proximity to vessels. Electronically Signed   By: Miachel Roux M.D.   On: 10/07/2020 15:22     Assessment and plan- Patient is a 55 y.o. 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  If  pathology is also consistent with adenocarcinoma from the supraclavicular lymph node biopsy, I will plan to send NGS testing on the specimen.  The clinical picture overall supports lung primary although the patient does not have a large lung mass and only had a 6 mm right middle lobe lung  nodule.  I will plan to proceed with cycle 1 of carbo Alimta chemotherapy today.  I did explain to the patient that if she is going to [facility] for a second opinion could consider holding off on chemotherapy today if she would consider upfront clinical trial at [facility] [address].  Patient states that she is going to [facility] more for second opinion and does not think that she would pursue any upfront clinical trials there.  I will therefore proceed with chemotherapy today.  I will hold off on starting Keytruda at this time until the NGS results from adrenal biopsy are back.  If she has any evidence of actionable mutations, initiating EGFR inhibitors after immunotherapy can increase risk of pneumonitis.  I will consider adding Keytruda with cycle 2.  Patient does have bone metastases and will complete palliative radiation to her right hip tomorrow.  She is also on as needed oxycodone.  She would benefit from addition of bisphosphonates and I would recommend Zometa to be given on a monthly basis.  Discussed risks and benefits of Zometa including all but not limited to fatigue, hypocalcemia and risk of osteonecrosis of the jaw.  We will obtain dental clearance prior to initiation of Zometa.  Patient understands and agrees to proceed as planned.  Brain mets are currently punctate And I will refer her to radiation oncology for radiation to those areas after a repeat MRI following 4 cycles of chemotherapy.  Anxiety: We will stop lorazepam and switch her to alprazolam which patient states helps her better.  Patient will see Dr. Rogue Bussing in 3 weeks in my absence with labs CBC with differential, CMP for cycle 2 of carboplatin Alimta chemotherapy and  possible Keytruda   Visit Diagnosis 1. Encounter for antineoplastic chemotherapy   2. Primary malignant neoplasm of right lung metastatic to other site ([place])   3. Neoplasm related pain   4. Anxiety      Dr. Randa Evens, MD, MPH [facility] [phone] 10/27/2020 3:23 PM

## 2020-10-27 NOTE — Progress Notes (Signed)
  Oncology Nurse Navigator Documentation  Navigator Location: [facility] Onc (10/27/20 1200)   )Navigator Encounter Type: Follow-up Appt (10/27/20 1200)     Confirmed Diagnosis Date: 10/14/20 (10/27/20 1200)               Patient Visit Type: MedOnc (10/27/20 1200) Treatment Phase: First Chemo Tx (10/27/20 1200) Barriers/Navigation Needs: No Barriers At This Time;No Needs (10/27/20 1200)   Interventions: None Required (10/27/20 1200)         met with patient during follow up visit with Dr. Janese Banks prior to receiving first chemotherapy infusion. All questions answered during visit. Pt informed that will be given appts before leaving today. Instructed to call with any questions or needs. Pt verbalized understanding.             Time Spent with Patient: 30 (10/27/20 1200)

## 2020-10-27 NOTE — Progress Notes (Signed)
Pt tolerated infusion well, with no s/s of distress noted. Pt stable at discharge.

## 2020-10-28 ENCOUNTER — Ambulatory Visit
Admission: RE | Admit: 2020-10-28 | Discharge: 2020-10-28 | Disposition: A | Discharge status: Home / Self Care | Payer: 59 | Source: Ambulatory Visit | Attending: Radiation Oncology | Admitting: Radiation Oncology

## 2020-10-28 ENCOUNTER — Other Ambulatory Visit: Payer: Self-pay | Admitting: Oncology

## 2020-10-28 ENCOUNTER — Telehealth: Payer: Self-pay

## 2020-10-28 DIAGNOSIS — Z5111 Encounter for antineoplastic chemotherapy: Secondary | ICD-10-CM | POA: Diagnosis not present

## 2020-10-28 LAB — HEPATITIS B SURFACE ANTIBODY, QUANTITATIVE: Hep B S AB Quant (Post): 18.4 m[IU]/mL (ref >9.9)

## 2020-10-28 NOTE — Telephone Encounter (Signed)
Telephone call to patient for follow up after receiving first infusion.   Patient states infusion went great.  States eating good and drinking plenty of fluids.   Denies any nausea or vomiting.  Encouraged patient to call for any concerns or questions. 

## 2020-10-29 NOTE — Progress Notes (Signed)
Request for dental clearance to initiate bisphosphonate treatment faxed to Dr. Kalman Shan at [phone].

## 2020-10-31 ENCOUNTER — Telehealth: Payer: Self-pay | Admitting: *Deleted

## 2020-10-31 NOTE — Telephone Encounter (Signed)
Left message with pt to return call to review results from last biopsy. Awaiting callback.

## 2020-11-07 ENCOUNTER — Encounter: Payer: Self-pay | Admitting: Oncology

## 2020-11-14 ENCOUNTER — Telehealth: Payer: Self-pay | Admitting: *Deleted

## 2020-11-14 NOTE — Telephone Encounter (Signed)
Called pt and let her know that we were unable to get the NSG testing to work. The sample that we had just had a lot of necrotic tissue and unable to use it for further testing. The MD was waiting for the test results to start you on keytruda. Since she saw the second opinion at [facility]. He suggested to move ahead with the [place] now. There is 1 test that can be tested on the specimen where she had her femur surgery. But the PDL1 test is the only thing you can test on bone specimen. I have ordered that already but dr Janese Banks went ahead and added [place] to you regimen.  However from a insurance standpoint when we add things on we have to back out of the original plan and cancel that order and then request for all 3 drugs together. As of today at 4:45 we do not have approval for this and we will be checking first thing in am and see what the response is. I wanted her to know the possibility of not being able to get the infusion if insurance does not approve it by mid am. She works in [employer], medical and helps get things approved so she understands and she will be there Monday and see what happens

## 2020-11-17 ENCOUNTER — Encounter: Payer: Self-pay | Admitting: *Deleted

## 2020-11-17 ENCOUNTER — Telehealth: Payer: Self-pay | Admitting: *Deleted

## 2020-11-17 ENCOUNTER — Inpatient Hospital Stay (HOSPITAL_BASED_OUTPATIENT_CLINIC_OR_DEPARTMENT_OTHER): Payer: 59 | Admitting: Internal Medicine

## 2020-11-17 ENCOUNTER — Inpatient Hospital Stay: Payer: 59

## 2020-11-17 ENCOUNTER — Inpatient Hospital Stay: Payer: 59 | Attending: Oncology

## 2020-11-17 DIAGNOSIS — C7951 Secondary malignant neoplasm of bone: Secondary | ICD-10-CM | POA: Diagnosis not present

## 2020-11-17 DIAGNOSIS — C7931 Secondary malignant neoplasm of brain: Secondary | ICD-10-CM | POA: Insufficient documentation

## 2020-11-17 DIAGNOSIS — I10 Essential (primary) hypertension: Secondary | ICD-10-CM | POA: Insufficient documentation

## 2020-11-17 DIAGNOSIS — Z5111 Encounter for antineoplastic chemotherapy: Secondary | ICD-10-CM | POA: Insufficient documentation

## 2020-11-17 DIAGNOSIS — C801 Malignant (primary) neoplasm, unspecified: Secondary | ICD-10-CM | POA: Diagnosis not present

## 2020-11-17 DIAGNOSIS — Z87891 Personal history of nicotine dependence: Secondary | ICD-10-CM | POA: Insufficient documentation

## 2020-11-17 DIAGNOSIS — C3491 Malignant neoplasm of unspecified part of right bronchus or lung: Secondary | ICD-10-CM

## 2020-11-17 DIAGNOSIS — F418 Other specified anxiety disorders: Secondary | ICD-10-CM | POA: Insufficient documentation

## 2020-11-17 LAB — CBC WITH DIFFERENTIAL/PLATELET
Abs Immature Granulocytes: 0.04 10*3/uL (ref 0.00–0.07)
Basophils Absolute: 0 10*3/uL (ref 0.0–0.1)
Basophils Relative: 0 %
Eosinophils Absolute: 0 10*3/uL (ref 0.0–0.5)
Eosinophils Relative: 0 %
HCT: 37.2 % (ref 36.0–46.0)
Hemoglobin: 12.5 g/dL (ref 12.0–15.0)
Immature Granulocytes: 1 %
Lymphocytes Relative: 17 %
Lymphs Abs: 1.5 10*3/uL (ref 0.7–4.0)
MCH: 28.8 pg (ref 26.0–34.0)
MCHC: 33.6 g/dL (ref 30.0–36.0)
MCV: 85.7 fL (ref 80.0–100.0)
Monocytes Absolute: 0.8 10*3/uL (ref 0.1–1.0)
Monocytes Relative: 9 %
Neutro Abs: 6.3 10*3/uL (ref 1.7–7.7)
Neutrophils Relative %: 73 %
Platelets: 310 10*3/uL (ref 150–400)
RBC: 4.34 MIL/uL (ref 3.87–5.11)
RDW: 13.8 % (ref 11.5–15.5)
WBC: 8.7 10*3/uL (ref 4.0–10.5)
nRBC: 0 % (ref 0.0–0.2)

## 2020-11-17 LAB — COMPREHENSIVE METABOLIC PANEL
ALT: 25 U/L (ref 0–44)
AST: 21 U/L (ref 15–41)
Albumin: 3.8 g/dL (ref 3.5–5.0)
Alkaline Phosphatase: 133 U/L — ABNORMAL HIGH (ref 38–126)
Anion gap: 13 (ref 5–15)
BUN: 15 mg/dL (ref 6–20)
CO2: 20 mmol/L — ABNORMAL LOW (ref 22–32)
Calcium: 9.2 mg/dL (ref 8.9–10.3)
Chloride: 106 mmol/L (ref 98–111)
Creatinine, Ser: 0.57 mg/dL (ref 0.44–1.00)
GFR, Estimated: >60 mL/min (ref >60)
Glucose, Bld: 130 mg/dL — ABNORMAL HIGH (ref 70–99)
Potassium: 3.7 mmol/L (ref 3.5–5.1)
Sodium: 139 mmol/L (ref 135–145)
Total Bilirubin: 0.6 mg/dL (ref 0.3–1.2)
Total Protein: 7.5 g/dL (ref 6.5–8.1)

## 2020-11-17 MED ORDER — ONDANSETRON HCL 4 MG PO TABS: 4.0000 mg | ORAL_TABLET | Freq: Three times a day (TID) | ORAL | 1 refills | Status: DC | PRN

## 2020-11-17 NOTE — Telephone Encounter (Signed)
I had called pt on Friday evening about that dr Janese Banks wanted to add on keytruda and therefore the entire chemo already approved is backed out of system and then restart all over and as of Friday there was no approval. I told her that she can come today and we can try to get it approved but it could be that will not get any chemo today and I was told by [place] that it is not approved today. Pt aware

## 2020-11-17 NOTE — Assessment & Plan Note (Addendum)
#  Stage IV/metastatic lung cancer-currently status post carbo Alimta [awaiting repeat biopsy/NGS testing].  Unfortunately, repeat biopsy-again quantity not sufficient for NGS testing.  Liquid biopsy negative  #Recommend proceeding with carbo Alimta cycle #2; defer to Dr.Rao with regards to [facility]. Labs today reviewed;  acceptable for treatment.  However hold chemotherapy today awaiting insurance approval for chemotherapy.   # Malignant left hip pain/bone metastases-oxycodone/Tylenol only as needed.  Refer to [facility].   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   # Anxiety: Stable on alprazolam prn qhs.   # IV Access: Discussed the pros and cons of IV access/port interested in port placement.  Make referral.  #Discussed with Hildred Alamin; await insurance approval to proceed with chemo-? Keytruda.   # DISPOSITION: # HOLD chemo today # maureen- OT re: debility # referral IR for port # follow up TBD-Dr.B

## 2020-11-17 NOTE — Progress Notes (Signed)
  Oncology Nurse Navigator Documentation  Navigator Location: [facility]-Med Onc (11/17/20 1000)   )Navigator Encounter Type: Follow-up Appt;Treatment (11/17/20 1000)                     Patient Visit Type: MedOnc (11/17/20 1000) Treatment Phase: Treatment (11/17/20 1000) Barriers/Navigation Needs: No Barriers At This Time (11/17/20 1000)   Interventions: Referrals;Coordination of Care (11/17/20 1000) Referrals: Rehab (11/17/20 1000) Coordination of Care: Appts;Chemo (11/17/20 1000)             met with patient during follow up visit. All questions answered during visit. Pt's chemo treatment to be delayed pending insurance authorization. Will reschedule treatment once authorized through insurance. Pt made aware that she will be notified when to come back for chemo as well as follow up visit. Pt interested in pursuing further outpatient rehab or CARE program. Pt scheduled to see Gwenette Greet on Wed 3/9. Nothing further needed at this time. Instructed pt to call with any questions or needs. Pt verbalized understanding.     Time Spent with Patient: 30 (11/17/20 1000)

## 2020-11-17 NOTE — Progress Notes (Signed)
[facility] OFFICE PROGRESS NOTE  Patient Care Team: Remi Haggard, FNP as PCP - General (Family Medicine) Telford Nab, RN as Oncology Nurse Navigator Noreene Filbert, MD as Radiation Oncologist (Radiation Oncology) Ottie Glazier, MD as Consulting Physician (Pulmonary Disease)  Cancer Staging Metastatic lung cancer (metastasis from lung to other site) [facility]) Staging form: Lung, AJCC 8th Edition - Clinical: Stage IV (cT1, cN3, pM1) - Signed by Sindy Guadeloupe, MD on 10/19/2020    Oncology History  Metastatic lung cancer (metastasis from lung to other site) [facility])  10/19/2020 Initial Diagnosis   Metastatic lung cancer (metastasis from lung to other site) [facility])   10/19/2020 Cancer Staging   Staging form: Lung, AJCC 8th Edition - Clinical: Stage IV (cT1, cN3, pM1) - Signed by Sindy Guadeloupe, MD on 10/19/2020   10/27/2020 -  Chemotherapy    Patient is on Treatment Plan: LUNG NSCLC PEMETREXED (ALIMTA) / CARBOPLATIN Q21D X 1 CYCLES          INTERVAL HISTORY:  This is my first interaction with the patient as patient's primary oncologist has been Dr.Rao. I reviewed the patient's prior charts/pertinent labs/imaging in detail.   Gina Sanchez 55 y.o.  female pleasant patient above history of metastatic adenocarcinoma of the lung is here for follow-up.  Patient is currently s/p cycle #1 of carboplatin-Alimta-while awaiting NGS.  Patient repeat lymph node biopsy-unfortunately quantity not sufficient for NGS.  Patient denies any worsening joint pains or bone pain.  She continues to take oxycodone/Tylenol only as needed.  She is moving around with a walker at this time.  Interested in physical therapy/Occupational Therapy.  Review of Systems  Constitutional: Negative for chills, diaphoresis, fever, malaise/fatigue and weight loss.  HENT: Negative for nosebleeds and sore throat.   Eyes: Negative for double vision.  Respiratory: Negative for cough, hemoptysis, sputum  production, shortness of breath and wheezing.   Cardiovascular: Negative for chest pain, palpitations, orthopnea and leg swelling.  Gastrointestinal: Negative for abdominal pain, blood in stool, constipation, diarrhea, heartburn, melena, nausea and vomiting.  Genitourinary: Negative for dysuria, frequency and urgency.  Musculoskeletal: Positive for joint pain. Negative for back pain.  Skin: Negative.  Negative for itching and rash.  Neurological: Negative for dizziness, tingling, focal weakness, weakness and headaches.  Endo/Heme/Allergies: Does not bruise/bleed easily.  Psychiatric/Behavioral: Negative for depression. The patient is not nervous/anxious and does not have insomnia.       PAST MEDICAL HISTORY :  Past Medical History:  Diagnosis Date  . Abnormal Pap smear of cervix   . Anxiety   . Depression   . Essential hypertension   . Femur fracture, right ([place])   . Palpitations   . Precordial chest pain     PAST SURGICAL HISTORY :   Past Surgical History:  Procedure Laterality Date  . BREAST BIOPSY Right 2017   benign  . CERVICAL BIOPSY  W/ LOOP ELECTRODE EXCISION    . COLONOSCOPY WITH PROPOFOL N/A 07/03/2015   Procedure: COLONOSCOPY WITH PROPOFOL;  Surgeon: Hulen Luster, MD;  Location: [facility] ENDOSCOPY;  Service: Gastroenterology;  Laterality: N/A;  . ESOPHAGOGASTRODUODENOSCOPY (EGD) WITH PROPOFOL N/A 07/03/2015   Procedure: ESOPHAGOGASTRODUODENOSCOPY (EGD) WITH PROPOFOL;  Surgeon: Hulen Luster, MD;  Location: [facility] ENDOSCOPY;  Service: Gastroenterology;  Laterality: N/A;  . INTRAMEDULLARY (IM) NAIL INTERTROCHANTERIC Right 09/15/2020   Procedure: INTRAMEDULLARY (IM) NAIL INTERTROCHANTRIC;  Surgeon: Corky Mull, MD;  Location: [facility] ORS;  Service: Orthopedics;  Laterality: Right;  . LEFT HEART CATH AND  CORONARY ANGIOGRAPHY N/A 03/28/2017   Procedure: Left Heart Cath and Coronary Angiography;  Surgeon: Lorretta Harp, MD;  Location: [place] CV LAB;  Service: Cardiovascular;   Laterality: N/A;    FAMILY HISTORY :   Family History  Problem Relation Age of Onset  . Diabetes Mother        alive @ 83  . Goiter Mother   . Heart disease Father        died of MI @ 105  . Osteoporosis Maternal Grandmother   . Colon cancer Maternal Grandfather   . Breast cancer Neg Hx   . Ovarian cancer Neg Hx     SOCIAL HISTORY:   Social History   Tobacco Use  . Smoking status: Former Smoker    Packs/day: 1.00    Years: 30.00    Pack years: 30.00    Quit date: 09/17/2020    Years since quitting: 0.1  . Smokeless tobacco: Never Used  Vaping Use  . Vaping Use: Never used  Substance Use Topics  . Alcohol use: Not Currently    Alcohol/week: 0.0 standard drinks  . Drug use: No    ALLERGIES:  is allergic to factive [gemifloxacin].  MEDICATIONS:  Current Outpatient Medications  Medication Sig Dispense Refill  . acetaminophen (TYLENOL) 325 MG tablet Take 1-2 tablets (325-650 mg total) by mouth every 6 (six) hours as needed for mild pain (pain score 1-3 or temp > 100.5).    Marland Kitchen ALPRAZolam (XANAX) 0.5 MG tablet Take 1 tablet (0.5 mg total) by mouth every 8 (eight) hours as needed for anxiety. 30 tablet 0  . dexamethasone (DECADRON) 4 MG tablet Take 1 tab two times a day the day before Alimta chemo, then take 2 tabs once a day for 3 days starting the day after cisplatin. 30 tablet 1  . folic acid (FOLVITE) 1 MG tablet Take 1 tablet (1 mg total) by mouth daily. Start 5-7 days before Alimta chemotherapy. Continue until 21 days after Alimta completed. 100 tablet 3  . lidocaine-prilocaine (EMLA) cream Apply to affected area once 30 g 3  . metoprolol succinate (TOPROL-XL) 25 MG 24 hr tablet Take 25 mg by mouth daily.    Marland Kitchen omeprazole (PRILOSEC) 20 MG capsule Take 1 capsule by mouth every morning.    Marland Kitchen oxyCODONE (OXY IR/ROXICODONE) 5 MG immediate release tablet Take 1-2 tablets (5-10 mg total) by mouth every 4 (four) hours as needed for moderate pain. 60 tablet 0  . polyethylene glycol  (MIRALAX) 17 g packet Take 17 g by mouth daily as needed for moderate constipation. 30 each 0  . ondansetron (ZOFRAN) 4 MG tablet Take 1 tablet (4 mg total) by mouth every 8 (eight) hours as needed for nausea or vomiting. 20 tablet 1  . prochlorperazine (COMPAZINE) 10 MG tablet Take 1 tablet (10 mg total) by mouth every 6 (six) hours as needed (Nausea or vomiting). (Patient not taking: Reported on 11/17/2020) 30 tablet 1   No current facility-administered medications for this visit.    PHYSICAL EXAMINATION: ECOG PERFORMANCE STATUS: 1 - Symptomatic but completely ambulatory  BP 135/86 (BP Location: Left Arm, Patient Position: Sitting)   Pulse 86   Temp (!) 97.1 F (36.2 C) (Tympanic)   Resp 16   Wt 181 lb 11.2 oz (82.4 kg)   LMP 09/15/2018   SpO2 100%   BMI 28.46 kg/m   Filed Weights   11/17/20 0917  Weight: 181 lb 11.2 oz (82.4 kg)    Physical Exam  Constitutional:      Comments: Accompanied by husband.  She is walking with a rolling walker.  HENT:     Head: Normocephalic and atraumatic.     Mouth/Throat:     Mouth: Oropharynx is clear and moist.     Pharynx: No oropharyngeal exudate.  Eyes:     Pupils: Pupils are equal, round, and reactive to light.  Cardiovascular:     Rate and Rhythm: Normal rate and regular rhythm.  Pulmonary:     Effort: Pulmonary effort is normal. No respiratory distress.     Breath sounds: Normal breath sounds. No wheezing.  Abdominal:     General: Bowel sounds are normal. There is no distension.     Palpations: Abdomen is soft. There is no mass.     Tenderness: There is no abdominal tenderness. There is no guarding or rebound.  Musculoskeletal:        General: No tenderness or edema. Normal range of motion.     Cervical back: Normal range of motion and neck supple.  Skin:    General: Skin is warm.  Neurological:     Mental Status: She is alert and oriented to person, place, and time.  Psychiatric:        Mood and Affect: Affect normal.         LABORATORY DATA:  I have reviewed the data as listed    Component Value Date/Time   NA 139 11/17/2020 0859   NA 142 03/14/2018 1343   K 3.7 11/17/2020 0859   CL 106 11/17/2020 0859   CO2 20 (L) 11/17/2020 0859   GLUCOSE 130 (H) 11/17/2020 0859   BUN 15 11/17/2020 0859   BUN 17 03/14/2018 1343   CREATININE 0.57 11/17/2020 0859   CALCIUM 9.2 11/17/2020 0859   PROT 7.5 11/17/2020 0859   ALBUMIN 3.8 11/17/2020 0859   AST 21 11/17/2020 0859   ALT 25 11/17/2020 0859   ALKPHOS 133 (H) 11/17/2020 0859   BILITOT 0.6 11/17/2020 0859   GFRNONAA >60 11/17/2020 0859   GFRAA 105 03/14/2018 1343    No results found for: SPEP, UPEP  Lab Results  Component Value Date   WBC 8.7 11/17/2020   NEUTROABS 6.3 11/17/2020   HGB 12.5 11/17/2020   HCT 37.2 11/17/2020   MCV 85.7 11/17/2020   PLT 310 11/17/2020      Chemistry      Component Value Date/Time   NA 139 11/17/2020 0859   NA 142 03/14/2018 1343   K 3.7 11/17/2020 0859   CL 106 11/17/2020 0859   CO2 20 (L) 11/17/2020 0859   BUN 15 11/17/2020 0859   BUN 17 03/14/2018 1343   CREATININE 0.57 11/17/2020 0859      Component Value Date/Time   CALCIUM 9.2 11/17/2020 0859   ALKPHOS 133 (H) 11/17/2020 0859   AST 21 11/17/2020 0859   ALT 25 11/17/2020 0859   BILITOT 0.6 11/17/2020 0859       RADIOGRAPHIC STUDIES: I have personally reviewed the radiological images as listed and agreed with the findings in the report. No results found.   ASSESSMENT & PLAN:  Metastatic lung cancer (metastasis from lung to other site) [facility]) #Stage IV/metastatic lung cancer-currently status post carbo Alimta [awaiting repeat biopsy/NGS testing].  Unfortunately, repeat biopsy-again quantity not sufficient for NGS testing.  Liquid biopsy negative  #Recommend proceeding with carbo Alimta cycle #2; defer to Dr.Rao with regards to [facility]. Labs today reviewed;  acceptable for treatment.  However hold chemotherapy today  awaiting insurance  approval for chemotherapy.   # Malignant left hip pain/bone metastases-oxycodone/Tylenol only as needed.  Refer to [facility].   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   # Anxiety: Stable on alprazolam prn qhs.   # IV Access: Discussed the pros and cons of IV access/port interested in port placement.  Make referral.  #Discussed with Hildred Alamin; await insurance approval to proceed with chemo-? Keytruda.   # DISPOSITION: # HOLD chemo today # maureen- OT re: debility # referral IR for port # follow up TBD-Dr.B    Orders Placed This Encounter  Procedures  . IR IMAGING GUIDED PORT INSERTION    Standing Status:   Future    Standing Expiration Date:   11/17/2021    Order Specific Question:   Reason for Exam (SYMPTOM  OR DIAGNOSIS REQUIRED)    Answer:   port a cath prior to chemo    Order Specific Question:   Is the patient pregnant?    Answer:   No    Order Specific Question:   Preferred Imaging Location?    Answer:   [facility]   All questions were answered. The patient knows to call the clinic with any problems, questions or concerns.      Gina Sickle, MD 11/18/2020 11:19 AM

## 2020-11-18 ENCOUNTER — Telehealth: Payer: Self-pay | Admitting: *Deleted

## 2020-11-18 NOTE — Telephone Encounter (Signed)
Message left with pt's dentist, Dr. Kalman Shan, regarding status of dental clearance prior to starting zometa. Dental clearance has been faxed to their office twice at this time with no response. Awaiting callback with update.

## 2020-11-19 ENCOUNTER — Inpatient Hospital Stay: Payer: 59 | Admitting: Occupational Therapy

## 2020-11-19 ENCOUNTER — Other Ambulatory Visit: Payer: Self-pay | Admitting: Oncology

## 2020-11-19 ENCOUNTER — Other Ambulatory Visit: Payer: Self-pay

## 2020-11-19 DIAGNOSIS — M6281 Muscle weakness (generalized): Secondary | ICD-10-CM

## 2020-11-19 NOTE — Therapy (Signed)
[facility] [address] [place], [place], [place], [address] Phone: [phone]   Fax:  [phone]  Occupational Therapy Screen  Patient Details  Name: Gina Sanchez MRN: [id number] Date of Birth: Jan 15, 1966 No data recorded  Encounter Date: 11/19/2020   OT End of Session - 11/19/20 1811    Visit Number 0           Past Medical History:  Diagnosis Date  . Abnormal Pap smear of cervix   . Anxiety   . Depression   . Essential hypertension   . Femur fracture, right ([place])   . Palpitations   . Precordial chest pain     Past Surgical History:  Procedure Laterality Date  . BREAST BIOPSY Right 2017   benign  . CERVICAL BIOPSY  W/ LOOP ELECTRODE EXCISION    . COLONOSCOPY WITH PROPOFOL N/A 07/03/2015   Procedure: COLONOSCOPY WITH PROPOFOL;  Surgeon: Hulen Luster, MD;  Location: [facility] ENDOSCOPY;  Service: Gastroenterology;  Laterality: N/A;  . ESOPHAGOGASTRODUODENOSCOPY (EGD) WITH PROPOFOL N/A 07/03/2015   Procedure: ESOPHAGOGASTRODUODENOSCOPY (EGD) WITH PROPOFOL;  Surgeon: Hulen Luster, MD;  Location: [facility] ENDOSCOPY;  Service: Gastroenterology;  Laterality: N/A;  . INTRAMEDULLARY (IM) NAIL INTERTROCHANTERIC Right 09/15/2020   Procedure: INTRAMEDULLARY (IM) NAIL INTERTROCHANTRIC;  Surgeon: Corky Mull, MD;  Location: [facility] ORS;  Service: Orthopedics;  Laterality: Right;  . LEFT HEART CATH AND CORONARY ANGIOGRAPHY N/A 03/28/2017   Procedure: Left Heart Cath and Coronary Angiography;  Surgeon: Lorretta Harp, MD;  Location: [place] CV LAB;  Service: Cardiovascular;  Laterality: N/A;    There were no vitals filed for this visit.   Subjective Assessment - 11/19/20 1810    Subjective  I had about 3-4 session of PT - but my copay's are $100 - wanted to check with you if there are other options - feel good when I leave there    Currently in Pain? Yes    Pain Score 1     Pain Location Hip    Pain Orientation Right    Pain  Descriptors / Indicators Aching    Pain Type Surgical pain    Pain Onset More than a month ago                DR [facility] 'S ASSESSMENT & PLAN 11/17/20:  Metastatic lung cancer (metastasis from lung to other site) [facility]) #Stage IV/metastatic lung cancer-currently status post carbo Alimta [awaiting repeat biopsy/NGS testing].  Unfortunately, repeat biopsy-again quantity not sufficient for NGS testing.  Liquid biopsy negative  #Recommend proceeding with carbo Alimta cycle #2; defer to Dr.Rao with regards to [facility]. Labs today reviewed;  acceptable for treatment.  However hold chemotherapy today awaiting insurance approval for chemotherapy.   # Malignant left hip pain/bone metastases-oxycodone/Tylenol only as needed.  Refer to [facility].   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   # Anxiety: Stable on alprazolam prn qhs.   # IV Access: Discussed the pros and cons of IV access/port interested in port placement.  Make referral.  #Discussed with Hildred Alamin; await insurance approval to proceed with chemo-? Keytruda.   # DISPOSITION: # HOLD chemo today # [place] Cl[place]- OT re: debility # referral IR for port # follow up TBD-Dr.B   OT SCREEN 11/19/20:       Pt arrive ambulating with walker -limping and hyper extention of knee. Pain about 1/10. Pt was refer for screen to me because of  having  difficult time with high copay for rehab where she is doing her out pt PT - and is interested in continuing outpatient rehab again since she did so well with it. She wants to be seen to discuss her options with outpatient rehab or the CARE program. She is trying to avoid high copay if possible but wants to do the best she can to get active again.  Did contact office manager at [facility] - and was told  So she met her deductible, but she still will be responsible for 50% of the billable charges. Depends on what is done. Could be on average 150ish. She will  be responsible for all visits until she meets her out of pocket of 8700( which all of that remains 3 units of there ex for PT would have her paying around 56 dollars a visit   Met with pt - pt not eligible for [facility] and also not at this time for CARE program - because base on last ortho visit - she do not have a lot of  callus formation yet -and to avoid pounding thru leg Pt to follow up with Ortho on 12/17/20 - and will possibly look into bone stimulator and repeat xray Pt wants to transfer care to PT outpt at [facility] for lower cost -and advise pt that she can maybe transfer in near future to CARE program to safe on cost - request order from Dr Roland Rack for PT to eval and tx - and talked with PT - pt has appt on Monday for eval and [facility] on [address]- advice pt to keep appt this week with her existing PT clinic.'  Discuss with pt to slow down when walking with walker and focus on knee control. Also to push up from chair and not pull up on walker.                                  Visit Diagnosis: Muscle weakness (generalized)    Problem List Patient Active Problem List   Diagnosis Date Noted  . Metastatic lung cancer (metastasis from lung to other site) ([place]) 10/19/2020  . Goals of care, counseling/discussion 10/19/2020  . Pathologic fracture   . Mediastinal adenopathy   . Closed fracture of right femur, unspecified fracture morphology, initial encounter ([place]) 09/14/2020  . Right femoral shaft fracture ([place]) 09/14/2020  . Right hip pain 09/14/2020  . Leukocytosis 09/14/2020  . GAD (generalized anxiety disorder) 09/14/2020  . Tobacco use 03/29/2017  . Unstable angina ([place]) 03/25/2017  . Essential hypertension   . Hyperlipidemia   . Precordial chest pain   . Palpitations     Rosalyn Gess OTR/L,CLT 11/19/2020, 6:12 PM  [facility] [address] [place], [place], [place],  [address] Phone: [phone]   Fax:  [phone]  Name: Gina Sanchez MRN: [id number] Date of Birth: 1966/06/25

## 2020-11-20 ENCOUNTER — Telehealth: Payer: Self-pay | Admitting: *Deleted

## 2020-11-20 NOTE — Telephone Encounter (Signed)
Contacted patient. Port a cath to be placed on 11/28/20. Arrival time at 730 am for an 830 am procedure. Patient instructed not to eat/drink anything after midnight (6-8 hours prior to the procedure). She will need to bring a driver.  Teach back process performed with patient

## 2020-11-21 ENCOUNTER — Other Ambulatory Visit: Payer: Self-pay | Admitting: *Deleted

## 2020-11-21 ENCOUNTER — Inpatient Hospital Stay: Payer: 59

## 2020-11-21 ENCOUNTER — Other Ambulatory Visit: Payer: Self-pay | Admitting: Oncology

## 2020-11-21 VITALS — BP 115/80 | HR 78 | Temp 97.0°F | Resp 18

## 2020-11-21 DIAGNOSIS — Z5111 Encounter for antineoplastic chemotherapy: Secondary | ICD-10-CM | POA: Diagnosis not present

## 2020-11-21 DIAGNOSIS — C3491 Malignant neoplasm of unspecified part of right bronchus or lung: Secondary | ICD-10-CM

## 2020-11-21 LAB — TSH: TSH: 0.42 u[IU]/mL (ref 0.350–4.500)

## 2020-11-21 MED ORDER — SODIUM CHLORIDE 0.9 % IV SOLN
Freq: Once | INTRAVENOUS | Status: AC
  Filled 2020-11-21: qty 250

## 2020-11-21 MED ORDER — SODIUM CHLORIDE 0.9 % IV SOLN
150.0000 mg | Freq: Once | INTRAVENOUS | Status: AC
  Administered 2020-11-21: 150 mg via INTRAVENOUS
  Filled 2020-11-21: qty 150

## 2020-11-21 MED ORDER — ZOLEDRONIC ACID 4 MG/100ML IV SOLN
4.0000 mg | INTRAVENOUS | Status: AC
  Administered 2020-11-21: 4 mg via INTRAVENOUS
  Filled 2020-11-21: qty 100

## 2020-11-21 MED ORDER — SODIUM CHLORIDE 0.9 % IV SOLN
200.0000 mg | Freq: Once | INTRAVENOUS | Status: AC
  Administered 2020-11-21: 200 mg via INTRAVENOUS
  Filled 2020-11-21: qty 8

## 2020-11-21 MED ORDER — SODIUM CHLORIDE 0.9 % IV SOLN
500.0000 mg/m2 | Freq: Once | INTRAVENOUS | Status: AC
  Administered 2020-11-21: 1000 mg via INTRAVENOUS
  Filled 2020-11-21: qty 40

## 2020-11-21 MED ORDER — SODIUM CHLORIDE 0.9 % IV SOLN
650.0000 mg | Freq: Once | INTRAVENOUS | Status: AC
  Administered 2020-11-21: 650 mg via INTRAVENOUS
  Filled 2020-11-21: qty 65

## 2020-11-21 MED ORDER — PALONOSETRON HCL INJECTION 0.25 MG/5ML
0.2500 mg | Freq: Once | INTRAVENOUS | Status: AC
  Administered 2020-11-21: 0.25 mg via INTRAVENOUS
  Filled 2020-11-21: qty 5

## 2020-11-21 MED ORDER — SODIUM CHLORIDE 0.9 % IV SOLN
10.0000 mg | Freq: Once | INTRAVENOUS | Status: AC
  Administered 2020-11-21: 10 mg via INTRAVENOUS
  Filled 2020-11-21: qty 10

## 2020-11-21 MED ORDER — CYANOCOBALAMIN 1000 MCG/ML IJ SOLN
1000.0000 ug | Freq: Once | INTRAMUSCULAR | Status: AC
  Administered 2020-11-21: 1000 ug via INTRAMUSCULAR
  Filled 2020-11-21: qty 1

## 2020-11-24 ENCOUNTER — Ambulatory Visit: Payer: 59 | Admitting: Physical Therapy

## 2020-11-26 NOTE — Progress Notes (Signed)
Patient on schedule for Port placement 11/28/2020, called and spoke with patient on phone with pre procedure instructions given. Made aware to be here @ 0730, NPO after Mn prior to  Procedure, and driver post procedure/recovery,stated understanding.

## 2020-11-27 ENCOUNTER — Other Ambulatory Visit: Payer: Self-pay | Admitting: *Deleted

## 2020-11-27 ENCOUNTER — Ambulatory Visit
Admission: RE | Admit: 2020-11-27 | Discharge: 2020-11-27 | Disposition: A | Discharge status: Home / Self Care | Payer: 59 | Source: Ambulatory Visit | Attending: Radiation Oncology | Admitting: Radiation Oncology

## 2020-11-27 ENCOUNTER — Other Ambulatory Visit: Payer: Self-pay | Admitting: Student

## 2020-11-27 ENCOUNTER — Other Ambulatory Visit: Payer: Self-pay

## 2020-11-27 VITALS — BP 112/82 | HR 76 | Temp 97.3°F | Resp 16 | Wt 179.8 lb

## 2020-11-27 DIAGNOSIS — C3490 Malignant neoplasm of unspecified part of unspecified bronchus or lung: Secondary | ICD-10-CM

## 2020-11-27 DIAGNOSIS — G939 Disorder of brain, unspecified: Secondary | ICD-10-CM | POA: Insufficient documentation

## 2020-11-27 DIAGNOSIS — C801 Malignant (primary) neoplasm, unspecified: Secondary | ICD-10-CM | POA: Diagnosis not present

## 2020-11-27 DIAGNOSIS — C7951 Secondary malignant neoplasm of bone: Secondary | ICD-10-CM | POA: Insufficient documentation

## 2020-11-27 DIAGNOSIS — Z923 Personal history of irradiation: Secondary | ICD-10-CM | POA: Insufficient documentation

## 2020-11-27 MED ORDER — ALPRAZOLAM 0.5 MG PO TABS: 0.5000 mg | ORAL_TABLET | Freq: Three times a day (TID) | ORAL | 0 refills | Status: DC | PRN

## 2020-11-27 NOTE — Progress Notes (Signed)
Radiation Oncology Follow up Note  Name: Gina Sanchez   Date:   11/27/2020 MRN:  [id number] DOB: 1966-06-26    This 55 y.o. female presents to the clinic today for 1 month follow-up status post palliative radiation therapy.  To her right femur and patient with known stage IV adenocarcinoma of the lung.  REFERRING PROVIDER: Remi Haggard, FNP  HPI: Patient is a 55 year old female now at 1 month having completed palliative radiation therapy to right femur for metastatic involvement of stage IV.  Adenocarcinoma of the lung.  She has also known brain and adrenal involvement.  She is seen today in routine follow-up 1 month out is doing well she is an excellent response as far as palliation of pain is concerned she is currently in physical therapy.  She is also currently being treated with Alimta carboplatinum and is just been started on Keytruda.  She is otherwise without complaint specifically denies any focal neurologic deficits headaches or change in visual fields.  COMPLICATIONS OF TREATMENT: none  FOLLOW UP COMPLIANCE: keeps appointments   PHYSICAL EXAM:  BP 112/82   Pulse 76   Temp (!) 97.3 F (36.3 C)   Resp 16   Wt 179 lb 12.8 oz (81.6 kg)   LMP 09/15/2018   SpO2 99%   BMI 28.16 kg/m  Range of motion of lower extremities does not elicit pain motor and sensory in detail levels are equal and symmetric in the lower extremities.  Well-developed well-nourished patient in NAD. HEENT reveals PERLA, EOMI, discs not visualized.  Oral cavity is clear. No oral mucosal lesions are identified. Neck is clear without evidence of cervical or supraclavicular adenopathy. Lungs are clear to A&P. Cardiac examination is essentially unremarkable with regular rate and rhythm without murmur rub or thrill. Abdomen is benign with no organomegaly or masses noted. Motor sensory and DTR levels are equal and symmetric in the upper and lower extremities. Cranial nerves II through XII are grossly intact.  Proprioception is intact. No peripheral adenopathy or edema is identified. No motor or sensory levels are noted. Crude visual fields are within normal range.  RADIOLOGY RESULTS: No current films for review  PLAN: Present time patient is achieved excellent palliation of pain in her right hip.  I am turning follow-up care over to medical oncology.  I would be happy to reevaluate the patient anytime should further palliative treatment be indicated.  They will be following her brain lesions which are small and punctate at this time.  Patient has been knows to call at anytime with any concerns.  I would like to take this opportunity to thank you for allowing me to participate in the care of your patient.Noreene Filbert, MD

## 2020-11-28 ENCOUNTER — Other Ambulatory Visit: Payer: Self-pay

## 2020-11-28 ENCOUNTER — Ambulatory Visit
Admission: RE | Admit: 2020-11-28 | Discharge: 2020-11-28 | Disposition: A | Discharge status: Home / Self Care | Payer: 59 | Source: Ambulatory Visit | Attending: Internal Medicine | Admitting: Internal Medicine

## 2020-11-28 DIAGNOSIS — Z87891 Personal history of nicotine dependence: Secondary | ICD-10-CM | POA: Insufficient documentation

## 2020-11-28 DIAGNOSIS — C3491 Malignant neoplasm of unspecified part of right bronchus or lung: Secondary | ICD-10-CM | POA: Diagnosis not present

## 2020-11-28 DIAGNOSIS — Z79899 Other long term (current) drug therapy: Secondary | ICD-10-CM | POA: Diagnosis not present

## 2020-11-28 DIAGNOSIS — I10 Essential (primary) hypertension: Secondary | ICD-10-CM | POA: Diagnosis not present

## 2020-11-28 DIAGNOSIS — Z881 Allergy status to other antibiotic agents status: Secondary | ICD-10-CM | POA: Diagnosis not present

## 2020-11-28 DIAGNOSIS — Z800 Family history of malignant neoplasm of digestive organs: Secondary | ICD-10-CM | POA: Insufficient documentation

## 2020-11-28 HISTORY — PX: IR IMAGING GUIDED PORT INSERTION: IMG5740

## 2020-11-28 MED ORDER — HEPARIN SOD (PORK) LOCK FLUSH 100 UNIT/ML IV SOLN
INTRAVENOUS | Status: AC
  Filled 2020-11-28: qty 5

## 2020-11-28 MED ORDER — FENTANYL CITRATE (PF) 100 MCG/2ML IJ SOLN
INTRAMUSCULAR | Status: AC
  Filled 2020-11-28: qty 2

## 2020-11-28 MED ORDER — SODIUM CHLORIDE 0.9 % IV SOLN: INTRAVENOUS | Status: DC

## 2020-11-28 MED ORDER — MIDAZOLAM HCL 2 MG/2ML IJ SOLN
INTRAMUSCULAR | Status: AC
  Filled 2020-11-28: qty 2

## 2020-11-28 MED ORDER — MIDAZOLAM HCL 2 MG/2ML IJ SOLN
INTRAMUSCULAR | Status: AC | PRN
Start: 2020-11-28 — End: 2020-11-28
  Administered 2020-11-28 (×2): 1 mg via INTRAVENOUS

## 2020-11-28 MED ORDER — FENTANYL CITRATE (PF) 100 MCG/2ML IJ SOLN
INTRAMUSCULAR | Status: AC | PRN
  Administered 2020-11-28 (×2): 50 ug via INTRAVENOUS

## 2020-11-28 NOTE — H&P (Signed)
Chief Complaint: Patient was seen in consultation today for Port-A-Cath placement at the request of Brahmanday,Govinda R  Referring Physician(s): Brahmanday,Govinda R   Patient Status: [facility] - Out-pt  History of Present Illness: Gina Sanchez is a 54 y.o. female with stage IV lung cancer.  The patient is known to the Interventional Radiology service with the previous adrenal and lymph node biopsies.  Patient was referred for Port-A-Cath placement for continued treatment of her stage IV lung cancer.  Patient has no complaints today.  Specifically, she denies chest pain, shortness of breath or abdominal symptoms.  Patient quit smoking in January.  Past Medical History:  Diagnosis Date  . Abnormal Pap smear of cervix   . Anxiety   . Depression   . Essential hypertension   . Femur fracture, right ([place])   . Palpitations   . Precordial chest pain     Past Surgical History:  Procedure Laterality Date  . BREAST BIOPSY Right 2017   benign  . CERVICAL BIOPSY  W/ LOOP ELECTRODE EXCISION    . COLONOSCOPY WITH PROPOFOL N/A 07/03/2015   Procedure: COLONOSCOPY WITH PROPOFOL;  Surgeon: Hulen Luster, MD;  Location: [facility] ENDOSCOPY;  Service: Gastroenterology;  Laterality: N/A;  . ESOPHAGOGASTRODUODENOSCOPY (EGD) WITH PROPOFOL N/A 07/03/2015   Procedure: ESOPHAGOGASTRODUODENOSCOPY (EGD) WITH PROPOFOL;  Surgeon: Hulen Luster, MD;  Location: [facility] ENDOSCOPY;  Service: Gastroenterology;  Laterality: N/A;  . INTRAMEDULLARY (IM) NAIL INTERTROCHANTERIC Right 09/15/2020   Procedure: INTRAMEDULLARY (IM) NAIL INTERTROCHANTRIC;  Surgeon: Corky Mull, MD;  Location: [facility] ORS;  Service: Orthopedics;  Laterality: Right;  . LEFT HEART CATH AND CORONARY ANGIOGRAPHY N/A 03/28/2017   Procedure: Left Heart Cath and Coronary Angiography;  Surgeon: Lorretta Harp, MD;  Location: [place] CV LAB;  Service: Cardiovascular;  Laterality: N/A;    Allergies: Factive [gemifloxacin]  Medications: Prior to  Admission medications   Medication Sig Start Date End Date Taking? Authorizing Provider  acetaminophen (TYLENOL) 325 MG tablet Take 1-2 tablets (325-650 mg total) by mouth every 6 (six) hours as needed for mild pain (pain score 1-3 or temp > 100.5). 09/17/20  Yes Wieting, Richard, MD  ALPRAZolam Duanne Moron) 0.5 MG tablet Take 1 tablet (0.5 mg total) by mouth every 8 (eight) hours as needed for anxiety. 11/27/20  Yes Sindy Guadeloupe, MD  folic acid (FOLVITE) 1 MG tablet Take 1 tablet (1 mg total) by mouth daily. Start 5-7 days before Alimta chemotherapy. Continue until 21 days after Alimta completed. 10/19/20  Yes Sindy Guadeloupe, MD  metoprolol succinate (TOPROL-XL) 25 MG 24 hr tablet Take 25 mg by mouth daily.   Yes [provider]  omeprazole (PRILOSEC) 20 MG capsule Take 1 capsule by mouth every morning.   Yes [provider]  ondansetron (ZOFRAN) 4 MG tablet Take 1 tablet (4 mg total) by mouth every 8 (eight) hours as needed for nausea or vomiting. 11/17/20  Yes Cammie Sickle, MD  oxyCODONE (OXY IR/ROXICODONE) 5 MG immediate release tablet Take 1-2 tablets (5-10 mg total) by mouth every 4 (four) hours as needed for moderate pain. 10/27/20  Yes Sindy Guadeloupe, MD  polyethylene glycol (MIRALAX) 17 g packet Take 17 g by mouth daily as needed for moderate constipation. 09/17/20  Yes Wieting, Richard, MD  dexamethasone (DECADRON) 4 MG tablet Take 1 tab two times a day the day before Alimta chemo, then take 2 tabs once a day for 3 days starting the day after cisplatin. 10/19/20   Randa Evens  C, MD  lidocaine-prilocaine (EMLA) cream Apply to affected area once 10/19/20   Sindy Guadeloupe, MD  prochlorperazine (COMPAZINE) 10 MG tablet Take 1 tablet (10 mg total) by mouth every 6 (six) hours as needed (Nausea or vomiting). 10/19/20   Sindy Guadeloupe, MD     Family History  Problem Relation Age of Onset  . Diabetes Mother        alive @ 56  . Goiter Mother   . Heart disease Father        died of MI  @ 38  . Osteoporosis Maternal Grandmother   . Colon cancer Maternal Grandfather   . Breast cancer Neg Hx   . Ovarian cancer Neg Hx     Social History   Socioeconomic History  . Marital status: Married    Spouse name: Not on file  . Number of children: Not on file  . Years of education: Not on file  . Highest education level: Not on file  Occupational History  . Not on file  Tobacco Use  . Smoking status: Former Smoker    Packs/day: 1.00    Years: 30.00    Pack years: 30.00    Quit date: 09/17/2020    Years since quitting: 0.1  . Smokeless tobacco: Never Used  Vaping Use  . Vaping Use: Never used  Substance and Sexual Activity  . Alcohol use: Not Currently    Alcohol/week: 0.0 standard drinks  . Drug use: No  . Sexual activity: Yes    Birth control/protection: I.U.D., Post-menopausal  Other Topics Concern  . Not on file  Social History Narrative   Lives in [place] with her husband.  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   Social Determinants of Health   Financial Resource Strain: Not on file  Food Insecurity: Not on file  Transportation Needs: Not on file  Physical Activity: Not on file  Stress: Not on file  Social Connections: Not on file   Review of Systems  Respiratory: Negative.   Cardiovascular: Negative.   Gastrointestinal: Negative.   Genitourinary: Negative.     Vital Signs: BP 115/74   Pulse 74   Temp 98.4 F (36.9 C) (Oral)   Resp 18   Ht 5' 7"  (1.702 m)   Wt 81.6 kg   LMP 09/15/2018   SpO2 97%   BMI 28.19 kg/m   Physical Exam Constitutional:      Appearance: She is not ill-appearing.  Cardiovascular:     Rate and Rhythm: Normal rate and regular rhythm.  Pulmonary:     Effort: Pulmonary effort is normal.     Breath sounds: Normal breath sounds.  Abdominal:     General: Abdomen is flat. Bowel sounds are normal.     Palpations: Abdomen is soft.  Neurological:     Mental Status: She is  alert.     Imaging: No results found.  Labs:  CBC: Recent Labs    09/17/20 0444 10/22/20 1048 10/27/20 0901 11/17/20 0859  WBC 10.6* 10.4 14.9* 8.7  HGB 10.7* 13.6 13.4 12.5  HCT 32.6* 40.3 39.8 37.2  PLT 269 347 373 310    COAGS: Recent Labs    09/14/20 1914  INR 1.1    BMP: Recent Labs    09/16/20 0530 09/17/20 0444 10/27/20 0901 11/17/20 0859  NA 138 139 136 139  K 4.2 3.7 3.7 3.7  CL 106 107 104 106  CO2 23 24 17*  20*  GLUCOSE 141* 96 184* 130*  BUN 16 15 15 15   CALCIUM 8.6* 8.4* 9.4 9.2  CREATININE 0.61 0.60 0.71 0.57  GFRNONAA >60 >60 >60 >60    LIVER FUNCTION TESTS: Recent Labs    09/14/20 1914 10/27/20 0901 11/17/20 0859  BILITOT 1.0 0.6 0.6  AST 16 23 21   ALT 16 18 25   ALKPHOS 84 115 133*  PROT 7.0 7.6 7.5  ALBUMIN 3.6 3.8 3.8    TUMOR MARKERS: No results for input(s): AFPTM, CEA, CA199, CHROMGRNA in the last 8760 hours.  Assessment and Plan:  55 year old with stage IV lung cancer and presents for Port-A-Cath placement.  Patient has no complaints today.  Plan for Port-A-Cath placement with moderate sedation.  Risks and benefits of image guided port-a-catheter placement was discussed with the patient including, but not limited to bleeding, infection, pneumothorax, or fibrin sheath development and need for additional procedures.  All of the patient's questions were answered, patient is agreeable to proceed. Consent signed and in chart.   Thank you for this interesting consult.  I greatly enjoyed meeting Gina Sanchez and look forward to participating in their care.  A copy of this report was sent to the requesting provider on this date.  Electronically Signed: Burman Riis, MD 11/28/2020, 8:30 AM   I spent a total of    10 Minutes in face to face in clinical consultation, greater than 50% of which was counseling/coordinating care for Port-A-Cath placement.

## 2020-11-28 NOTE — Procedures (Signed)
Interventional Radiology Procedure:   Indications: Stage 4 lung cancer  Procedure: Port placement  Findings: Right jugular port, tip at SVC/RA junction  Complications: None     EBL: Minimal, less than 10 ml  Plan: Discharge in one hour.  Keep port site and incisions dry for at least 24 hours.     Gina Sanchez R. Anselm Pancoast, MD  Pager: [phone]

## 2020-12-02 ENCOUNTER — Encounter: Payer: 59 | Admitting: Physical Therapy

## 2020-12-09 ENCOUNTER — Encounter: Payer: 59 | Admitting: Physical Therapy

## 2020-12-12 ENCOUNTER — Inpatient Hospital Stay: Payer: 59 | Attending: Oncology

## 2020-12-12 ENCOUNTER — Inpatient Hospital Stay: Payer: 59

## 2020-12-12 ENCOUNTER — Encounter: Payer: Self-pay | Admitting: *Deleted

## 2020-12-12 ENCOUNTER — Inpatient Hospital Stay (HOSPITAL_BASED_OUTPATIENT_CLINIC_OR_DEPARTMENT_OTHER): Payer: 59 | Admitting: Oncology

## 2020-12-12 ENCOUNTER — Other Ambulatory Visit: Payer: Self-pay | Admitting: *Deleted

## 2020-12-12 VITALS — BP 127/78 | HR 79 | Temp 97.4°F | Wt 189.1 lb

## 2020-12-12 VITALS — BP 144/77 | HR 81 | Resp 16

## 2020-12-12 DIAGNOSIS — C7931 Secondary malignant neoplasm of brain: Secondary | ICD-10-CM | POA: Diagnosis not present

## 2020-12-12 DIAGNOSIS — Z7983 Long term (current) use of bisphosphonates: Secondary | ICD-10-CM

## 2020-12-12 DIAGNOSIS — Z79899 Other long term (current) drug therapy: Secondary | ICD-10-CM | POA: Diagnosis not present

## 2020-12-12 DIAGNOSIS — Z5111 Encounter for antineoplastic chemotherapy: Secondary | ICD-10-CM

## 2020-12-12 DIAGNOSIS — C801 Malignant (primary) neoplasm, unspecified: Secondary | ICD-10-CM | POA: Insufficient documentation

## 2020-12-12 DIAGNOSIS — Z5181 Encounter for therapeutic drug level monitoring: Secondary | ICD-10-CM

## 2020-12-12 DIAGNOSIS — Z5112 Encounter for antineoplastic immunotherapy: Secondary | ICD-10-CM

## 2020-12-12 DIAGNOSIS — I10 Essential (primary) hypertension: Secondary | ICD-10-CM | POA: Diagnosis not present

## 2020-12-12 DIAGNOSIS — G893 Neoplasm related pain (acute) (chronic): Secondary | ICD-10-CM

## 2020-12-12 DIAGNOSIS — C7951 Secondary malignant neoplasm of bone: Secondary | ICD-10-CM | POA: Insufficient documentation

## 2020-12-12 DIAGNOSIS — C3491 Malignant neoplasm of unspecified part of right bronchus or lung: Secondary | ICD-10-CM

## 2020-12-12 DIAGNOSIS — Z87891 Personal history of nicotine dependence: Secondary | ICD-10-CM | POA: Insufficient documentation

## 2020-12-12 LAB — CBC WITH DIFFERENTIAL/PLATELET
Abs Immature Granulocytes: 0.07 10*3/uL (ref 0.00–0.07)
Basophils Absolute: 0 10*3/uL (ref 0.0–0.1)
Basophils Relative: 0 %
Eosinophils Absolute: 0 10*3/uL (ref 0.0–0.5)
Eosinophils Relative: 0 %
HCT: 36.3 % (ref 36.0–46.0)
Hemoglobin: 12 g/dL (ref 12.0–15.0)
Immature Granulocytes: 1 %
Lymphocytes Relative: 21 %
Lymphs Abs: 1.3 10*3/uL (ref 0.7–4.0)
MCH: 29.1 pg (ref 26.0–34.0)
MCHC: 33.1 g/dL (ref 30.0–36.0)
MCV: 87.9 fL (ref 80.0–100.0)
Monocytes Absolute: 0.8 10*3/uL (ref 0.1–1.0)
Monocytes Relative: 12 %
Neutro Abs: 4 10*3/uL (ref 1.7–7.7)
Neutrophils Relative %: 66 %
Platelets: 474 10*3/uL — ABNORMAL HIGH (ref 150–400)
RBC: 4.13 MIL/uL (ref 3.87–5.11)
RDW: 15.9 % — ABNORMAL HIGH (ref 11.5–15.5)
WBC: 6.2 10*3/uL (ref 4.0–10.5)
nRBC: 0 % (ref 0.0–0.2)

## 2020-12-12 LAB — COMPREHENSIVE METABOLIC PANEL
ALT: 28 U/L (ref 0–44)
AST: 20 U/L (ref 15–41)
Albumin: 3.6 g/dL (ref 3.5–5.0)
Alkaline Phosphatase: 127 U/L — ABNORMAL HIGH (ref 38–126)
Anion gap: 9 (ref 5–15)
BUN: 16 mg/dL (ref 6–20)
CO2: 22 mmol/L (ref 22–32)
Calcium: 9 mg/dL (ref 8.9–10.3)
Chloride: 108 mmol/L (ref 98–111)
Creatinine, Ser: 0.54 mg/dL (ref 0.44–1.00)
GFR, Estimated: >60 mL/min (ref >60)
Glucose, Bld: 149 mg/dL — ABNORMAL HIGH (ref 70–99)
Potassium: 3.8 mmol/L (ref 3.5–5.1)
Sodium: 139 mmol/L (ref 135–145)
Total Bilirubin: 0.4 mg/dL (ref 0.3–1.2)
Total Protein: 7.2 g/dL (ref 6.5–8.1)

## 2020-12-12 MED ORDER — HEPARIN SOD (PORK) LOCK FLUSH 100 UNIT/ML IV SOLN
INTRAVENOUS | Status: AC
  Filled 2020-12-12: qty 5

## 2020-12-12 MED ORDER — SODIUM CHLORIDE 0.9 % IV SOLN
200.0000 mg | Freq: Once | INTRAVENOUS | Status: AC
  Administered 2020-12-12: 200 mg via INTRAVENOUS
  Filled 2020-12-12: qty 8

## 2020-12-12 MED ORDER — PALONOSETRON HCL INJECTION 0.25 MG/5ML
0.2500 mg | Freq: Once | INTRAVENOUS | Status: AC
  Administered 2020-12-12: 0.25 mg via INTRAVENOUS
  Filled 2020-12-12: qty 5

## 2020-12-12 MED ORDER — SODIUM CHLORIDE 0.9 % IV SOLN
10.0000 mg | Freq: Once | INTRAVENOUS | Status: AC
  Administered 2020-12-12: 10 mg via INTRAVENOUS
  Filled 2020-12-12: qty 10

## 2020-12-12 MED ORDER — SODIUM CHLORIDE 0.9 % IV SOLN
500.0000 mg/m2 | Freq: Once | INTRAVENOUS | Status: AC
  Administered 2020-12-12: 1000 mg via INTRAVENOUS
  Filled 2020-12-12: qty 40

## 2020-12-12 MED ORDER — SODIUM CHLORIDE 0.9 % IV SOLN
150.0000 mg | Freq: Once | INTRAVENOUS | Status: AC
  Administered 2020-12-12: 150 mg via INTRAVENOUS
  Filled 2020-12-12: qty 150

## 2020-12-12 MED ORDER — HEPARIN SOD (PORK) LOCK FLUSH 100 UNIT/ML IV SOLN
500.0000 [IU] | Freq: Once | INTRAVENOUS | Status: AC
  Administered 2020-12-12: 500 [IU] via INTRAVENOUS
  Filled 2020-12-12: qty 5

## 2020-12-12 MED ORDER — CARBOPLATIN CHEMO INJECTION 600 MG/60ML
650.0000 mg | Freq: Once | INTRAVENOUS | Status: AC
Start: 2020-12-12 — End: 2020-12-12
  Administered 2020-12-12: 650 mg via INTRAVENOUS
  Filled 2020-12-12: qty 65

## 2020-12-12 MED ORDER — OXYCODONE HCL 5 MG PO TABS: 5.0000 mg | ORAL_TABLET | ORAL | 0 refills | Status: DC | PRN

## 2020-12-12 MED ORDER — SODIUM CHLORIDE 0.9 % IV SOLN
Freq: Once | INTRAVENOUS | Status: AC
  Filled 2020-12-12: qty 250

## 2020-12-12 MED ORDER — SODIUM CHLORIDE 0.9% FLUSH
10.0000 mL | INTRAVENOUS | Status: DC | PRN
  Administered 2020-12-12: 10 mL via INTRAVENOUS
  Filled 2020-12-12: qty 10

## 2020-12-12 NOTE — Progress Notes (Signed)
  Oncology Nurse Navigator Documentation  Navigator Location: [facility]-Med Onc (12/12/20 1200)   )Navigator Encounter Type: Follow-up Appt;Treatment (12/12/20 1200)                     Patient Visit Type: MedOnc (12/12/20 1200)   Barriers/Navigation Needs: No Barriers At This Time;No Needs;No Questions (12/12/20 1200)   Interventions: None Required (12/12/20 1200)                      Time Spent with Patient: 15 (12/12/20 1200)

## 2020-12-12 NOTE — Progress Notes (Signed)
Hematology/Oncology Consult note [facility]  Telephone:([phone] [phone]  Patient Care Team: Remi Haggard, FNP as PCP - General (Family Medicine) Sanchez Nab, RN as Oncology Nurse Navigator Noreene Filbert, MD as Radiation Oncologist (Radiation Oncology) Ottie Glazier, MD as Consulting Physician (Pulmonary Disease)   Name of the patient: Gina Sanchez  [id number]  14-Aug-1966   Date of visit: 12/12/20  Diagnosis- metastatic adenocarcinoma likely lung primary   Chief complaint/ Reason for visit-on treatment assessment prior to cycle 3 of carbo Alimta Keytruda chemotherapy  Heme/Onc history: Patient is a 55 year old female with a past medical history significant for hypertension hyperlipidemia and anxiety who presented with right thigh pain and was found to have an acute right proximal femoral shaft fracture. She underwent operative fixation on 10/12/2020. MRI of femur showed heterogeneously enhancing osseous lesion within the area which would be nonspecific versus office neoplasm or metastatic lesion. CT chest abdomen and pelvis with contrast showed an enlarged pretracheal lymph node 2.2 x 1.5 cm and a right paratracheal lymph node measuring 2.7 x 1.3 cm. Prevascular node measuring 0.3 x 0.9 cm. 5 x 4 mm right middle lobe nodule. 2.8 x 1.9 cm left adrenal lesion.  Reamings from the right femur showed metastatic poorly differentiated carcinoma. Immunohistochemistry showed was positive for pancytokeratin, CK7 and patchy CK20 with patchy dim expression of TTF-1. Cells negative for Melan-A, CDX2, PAX8, Napsin A, GATA3, p40, CD56, p16 and thyroglobulin. Findings compatible with metastatic carcinoma but because of decalcification immunohistochemical staining is unreliable. Patchy dim staining with TTF-1 suspicious for lung primary but not a definitive diagnosis.  Repeat supraclavicular lymph node biopsy showed metastatic  adenocarcinoma. Tumor cells positive for CK7 with focal weak staining for TTF-1. Suggestive of lung origin in the proper clinical context. Cells were negative for GATA3 PAX8 CDX2 and CK20 and Napsin A.  Foundation 1 liquid biopsy showed  Showed K-ras G12 C, PIK3 CA, KDA P1C171F, KDM 5CE 296  Interval history-patient reports doing well overall.  She is ambulating better.  Pain is well controlled with as needed oxycodone.  She uses 1 dose of Xanax at night.  She hopes to get back to part-time work next week.  Denies any significant nausea or vomiting  ECOG PS- 1 Pain scale- 2 Opioid associated constipation- no  Review of systems- Review of Systems  Constitutional: Negative for chills, fever, malaise/fatigue and weight loss.  HENT: Negative for congestion, ear discharge and nosebleeds.   Eyes: Negative for blurred vision.  Respiratory: Negative for cough, hemoptysis, sputum production, shortness of breath and wheezing.   Cardiovascular: Negative for chest pain, palpitations, orthopnea and claudication.  Gastrointestinal: Negative for abdominal pain, blood in stool, constipation, diarrhea, heartburn, melena, nausea and vomiting.  Genitourinary: Negative for dysuria, flank pain, frequency, hematuria and urgency.  Musculoskeletal: Negative for back pain, joint pain and myalgias.       Right hip pain  Skin: Negative for rash.  Neurological: Negative for dizziness, tingling, focal weakness, seizures, weakness and headaches.  Endo/Heme/Allergies: Does not bruise/bleed easily.  Psychiatric/Behavioral: Negative for depression and suicidal ideas. The patient does not have insomnia.        Allergies  Allergen Reactions  . Factive [Gemifloxacin] Rash     Past Medical History:  Diagnosis Date  . Abnormal Pap smear of cervix   . Anxiety   . Depression   . Essential hypertension   . Femur fracture, right ([place])   . Palpitations   . Precordial chest pain  Past Surgical History:   Procedure Laterality Date  . BREAST BIOPSY Right 2017   benign  . CERVICAL BIOPSY  W/ LOOP ELECTRODE EXCISION    . COLONOSCOPY WITH PROPOFOL N/A 07/03/2015   Procedure: COLONOSCOPY WITH PROPOFOL;  Surgeon: Hulen Luster, MD;  Location: [facility] ENDOSCOPY;  Service: Gastroenterology;  Laterality: N/A;  . ESOPHAGOGASTRODUODENOSCOPY (EGD) WITH PROPOFOL N/A 07/03/2015   Procedure: ESOPHAGOGASTRODUODENOSCOPY (EGD) WITH PROPOFOL;  Surgeon: Hulen Luster, MD;  Location: [facility] ENDOSCOPY;  Service: Gastroenterology;  Laterality: N/A;  . INTRAMEDULLARY (IM) NAIL INTERTROCHANTERIC Right 09/15/2020   Procedure: INTRAMEDULLARY (IM) NAIL INTERTROCHANTRIC;  Surgeon: Corky Mull, MD;  Location: [facility] ORS;  Service: Orthopedics;  Laterality: Right;  . IR IMAGING GUIDED PORT INSERTION  11/28/2020  . LEFT HEART CATH AND CORONARY ANGIOGRAPHY N/A 03/28/2017   Procedure: Left Heart Cath and Coronary Angiography;  Surgeon: Lorretta Harp, MD;  Location: [place] CV LAB;  Service: Cardiovascular;  Laterality: N/A;    Social History   Socioeconomic History  . Marital status: Married    Spouse name: Not on file  . Number of children: Not on file  . Years of education: Not on file  . Highest education level: Not on file  Occupational History  . Not on file  Tobacco Use  . Smoking status: Former Smoker    Packs/day: 1.00    Years: 30.00    Pack years: 30.00    Quit date: 09/17/2020    Years since quitting: 0.2  . Smokeless tobacco: Never Used  Vaping Use  . Vaping Use: Never used  Substance and Sexual Activity  . Alcohol use: Not Currently    Alcohol/week: 0.0 standard drinks  . Drug use: No  . Sexual activity: Yes    Birth control/protection: I.U.D., Post-menopausal  Other Topics Concern  . Not on file  Social History Narrative   Lives in [place] with her husband.  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   Social Determinants of Health   Financial Resource  Strain: Not on file  Food Insecurity: Not on file  Transportation Needs: Not on file  Physical Activity: Not on file  Stress: Not on file  Social Connections: Not on file  Intimate Partner Violence: Not on file    Family History  Problem Relation Age of Onset  . Diabetes Mother        alive @ 31  . Goiter Mother   . Heart disease Father        died of MI @ 56  . Osteoporosis Maternal Grandmother   . Colon cancer Maternal Grandfather   . Breast cancer Neg Hx   . Ovarian cancer Neg Hx      Current Outpatient Medications:  .  acetaminophen (TYLENOL) 325 MG tablet, Take 1-2 tablets (325-650 mg total) by mouth every 6 (six) hours as needed for mild pain (pain score 1-3 or temp > 100.5)., Disp: , Rfl:  .  ALPRAZolam (XANAX) 0.5 MG tablet, Take 1 tablet (0.5 mg total) by mouth every 8 (eight) hours as needed for anxiety., Disp: 30 tablet, Rfl: 0 .  dexamethasone (DECADRON) 4 MG tablet, Take 1 tab two times a day the day before Alimta chemo, then take 2 tabs once a day for 3 days starting the day after cisplatin., Disp: 30 tablet, Rfl: 1 .  folic acid (FOLVITE) 1 MG tablet, Take 1 tablet (1 mg total) by mouth daily. Start 5-7 days before Alimta  chemotherapy. Continue until 21 days after Alimta completed., Disp: 100 tablet, Rfl: 3 .  lidocaine-prilocaine (EMLA) cream, Apply to affected area once, Disp: 30 g, Rfl: 3 .  metoprolol succinate (TOPROL-XL) 25 MG 24 hr tablet, Take 25 mg by mouth daily., Disp: , Rfl:  .  omeprazole (PRILOSEC) 20 MG capsule, Take 1 capsule by mouth every morning., Disp: , Rfl:  .  ondansetron (ZOFRAN) 4 MG tablet, Take 1 tablet (4 mg total) by mouth every 8 (eight) hours as needed for nausea or vomiting., Disp: 20 tablet, Rfl: 1 .  oxyCODONE (OXY IR/ROXICODONE) 5 MG immediate release tablet, Take 1-2 tablets (5-10 mg total) by mouth every 4 (four) hours as needed for moderate pain., Disp: 60 tablet, Rfl: 0 .  polyethylene glycol (MIRALAX) 17 g packet, Take 17 g by  mouth daily as needed for moderate constipation., Disp: 30 each, Rfl: 0 .  prochlorperazine (COMPAZINE) 10 MG tablet, Take 1 tablet (10 mg total) by mouth every 6 (six) hours as needed (Nausea or vomiting)., Disp: 30 tablet, Rfl: 1 No current facility-administered medications for this visit.  Facility-Administered Medications Ordered in Other Visits:  .  heparin lock flush 100 unit/mL, 500 Units, Intravenous, Once, Randa Evens C, MD .  sodium chloride flush (NS) 0.9 % injection 10 mL, 10 mL, Intravenous, PRN, Sindy Guadeloupe, MD, 10 mL at 12/12/20 0839 .  Zoledronic Acid (ZOMETA) IVPB 4 mg, 4 mg, Intravenous, Q28 days, Sindy Guadeloupe, MD, Stopped at 11/21/20 1100  Physical exam:  Vitals:   12/12/20 0849  BP: 127/78  Pulse: 79  Temp: (!) 97.4 F (36.3 C)  TempSrc: Tympanic  SpO2: 98%  Weight: 189 lb 1.6 oz (85.8 kg)   Physical Exam Constitutional:      Comments: Ambulates with a walker.  Appears in no acute distress  Cardiovascular:     Rate and Rhythm: Normal rate and regular rhythm.     Heart sounds: Normal heart sounds.  Pulmonary:     Effort: Pulmonary effort is normal.  Skin:    General: Skin is warm and dry.  Neurological:     Mental Status: She is alert and oriented to person, place, and time.      CMP Latest Ref Rng & Units 11/17/2020  Glucose 70 - 99 mg/dL 130(H)  BUN 6 - 20 mg/dL 15  Creatinine 0.44 - 1.00 mg/dL 0.57  Sodium 135 - 145 mmol/L 139  Potassium 3.5 - 5.1 mmol/L 3.7  Chloride 98 - 111 mmol/L 106  CO2 22 - 32 mmol/L 20(L)  Calcium 8.9 - 10.3 mg/dL 9.2  Total Protein 6.5 - 8.1 g/dL 7.5  Total Bilirubin 0.3 - 1.2 mg/dL 0.6  Alkaline Phos 38 - 126 U/L 133(H)  AST 15 - 41 U/L 21  ALT 0 - 44 U/L 25   CBC Latest Ref Rng & Units 11/17/2020  WBC 4.0 - 10.5 K/uL 8.7  Hemoglobin 12.0 - 15.0 g/dL 12.5  Hematocrit 36.0 - 46.0 % 37.2  Platelets 150 - 400 K/uL 310    No images are attached to the encounter.  IR IMAGING GUIDED PORT INSERTION  Result Date:  11/28/2020 INDICATION: 55 year old with stage IV lung cancer. Port-A-Cath needed for treatment. EXAM: FLUOROSCOPIC AND ULTRASOUND GUIDED PLACEMENT OF A SUBCUTANEOUS PORT COMPARISON:  None. MEDICATIONS: Moderate sedation ANESTHESIA/SEDATION: Versed 2.0 mg IV; Fentanyl 100 mcg IV; Moderate Sedation Time:  32 minutes The patient was continuously monitored during the procedure by the interventional radiology nurse under my direct supervision.  FLUOROSCOPY TIME:  24 seconds, 2 mGy COMPLICATIONS: None immediate. PROCEDURE: The procedure, risks, benefits, and alternatives were explained to the patient. Questions regarding the procedure were encouraged and answered. The patient understands and consents to the procedure. Patient was placed supine on the interventional table. Ultrasound confirmed a patent right internal jugular vein. Ultrasound image was saved for documentation. The right chest and neck were cleaned with a skin antiseptic and a sterile drape was placed. Maximal barrier sterile technique was utilized including caps, mask, sterile gowns, sterile gloves, sterile drape, hand hygiene and skin antiseptic. The right neck was anesthetized with 1% lidocaine. Small incision was made in the right neck with a blade. Micropuncture set was placed in the right internal jugular vein with ultrasound guidance. The micropuncture wire was used for measurement purposes. The right chest was anesthetized with 1% lidocaine with epinephrine. #15 blade was used to make an incision and a subcutaneous port pocket was formed. [place] was assembled. Subcutaneous tunnel was formed with a stiff tunneling device. The port catheter was brought through the subcutaneous tunnel. The port was placed in the subcutaneous pocket. The micropuncture set was exchanged for a peel-away sheath. The catheter was placed through the peel-away sheath and the tip was positioned at the superior cavoatrial junction. Catheter placement was confirmed  with fluoroscopy. The port was accessed and flushed with heparinized saline. The port pocket was closed using two layers of absorbable sutures and Dermabond. The vein skin site was closed using a single layer of absorbable suture and Dermabond. Sterile dressings were applied. Patient tolerated the procedure well without an immediate complication. Ultrasound and fluoroscopic images were taken and saved for this procedure. IMPRESSION: Placement of a subcutaneous port device. Catheter tip at the superior cavoatrial junction. Electronically Signed   By: Markus Daft M.D.   On: 11/28/2020 12:01     Assessment and plan- Patient is a 55 y.o. 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  She is here for on treatment assessment prior to cycle 3 of carbo Alimta Keytruda chemotherapy  Counts okay to proceed with cycle 3 of carbo Alimta Keytruda chemotherapy today.  I will see her back in 3 weeks for cycle 4.  Repeat scans including MRI brain after 4 cycles.  Bone metastases: She will get her next dose of Zometa in 1 week  Neoplasm related pain: Continue as needed oxycodone  Anxiety: Continue nightly Xanax  Visit Diagnosis 1. Encounter for antineoplastic chemotherapy   2. Encounter for antineoplastic immunotherapy   3. Primary malignant neoplasm of right lung metastatic to other site ([place])   4. Encounter for monitoring zoledronic acid therapy   5. Neoplasm related pain      Dr. Randa Evens, MD, MPH [facility] [phone] 12/12/2020 8:41 AM

## 2020-12-17 ENCOUNTER — Encounter: Payer: 59 | Admitting: Physical Therapy

## 2020-12-19 ENCOUNTER — Inpatient Hospital Stay: Payer: 59

## 2020-12-19 VITALS — BP 115/76 | HR 72 | Temp 97.6°F | Resp 16

## 2020-12-19 DIAGNOSIS — Z5111 Encounter for antineoplastic chemotherapy: Secondary | ICD-10-CM | POA: Diagnosis not present

## 2020-12-19 DIAGNOSIS — C3491 Malignant neoplasm of unspecified part of right bronchus or lung: Secondary | ICD-10-CM

## 2020-12-19 MED ORDER — ZOLEDRONIC ACID 4 MG/100ML IV SOLN: 4.0000 mg | INTRAVENOUS | Status: DC

## 2020-12-19 MED ORDER — ZOLEDRONIC ACID 4 MG/5ML IV CONC
4.0000 mg | Freq: Once | INTRAVENOUS | Status: AC
  Administered 2020-12-19: 4 mg via INTRAVENOUS
  Filled 2020-12-19: qty 5

## 2020-12-19 MED ORDER — HEPARIN SOD (PORK) LOCK FLUSH 100 UNIT/ML IV SOLN
500.0000 [IU] | Freq: Once | INTRAVENOUS | Status: AC | PRN
  Administered 2020-12-19: 500 [IU]
  Filled 2020-12-19: qty 5

## 2020-12-19 MED ORDER — HEPARIN SOD (PORK) LOCK FLUSH 100 UNIT/ML IV SOLN
INTRAVENOUS | Status: AC
  Filled 2020-12-19: qty 5

## 2020-12-19 MED ORDER — SODIUM CHLORIDE 0.9 % IV SOLN
Freq: Once | INTRAVENOUS | Status: AC
  Filled 2020-12-19: qty 250

## 2020-12-22 ENCOUNTER — Encounter: Payer: 59 | Admitting: Physical Therapy

## 2020-12-31 ENCOUNTER — Encounter: Payer: 59 | Admitting: Physical Therapy

## 2021-01-01 ENCOUNTER — Inpatient Hospital Stay: Payer: 59

## 2021-01-01 ENCOUNTER — Inpatient Hospital Stay (HOSPITAL_BASED_OUTPATIENT_CLINIC_OR_DEPARTMENT_OTHER): Payer: 59 | Admitting: Oncology

## 2021-01-01 ENCOUNTER — Encounter: Payer: Self-pay | Admitting: Oncology

## 2021-01-01 ENCOUNTER — Other Ambulatory Visit: Payer: Self-pay

## 2021-01-01 VITALS — BP 125/89 | HR 81 | Temp 97.7°F | Resp 20 | Wt 194.0 lb

## 2021-01-01 DIAGNOSIS — Z5111 Encounter for antineoplastic chemotherapy: Secondary | ICD-10-CM

## 2021-01-01 DIAGNOSIS — C3491 Malignant neoplasm of unspecified part of right bronchus or lung: Secondary | ICD-10-CM

## 2021-01-01 DIAGNOSIS — G893 Neoplasm related pain (acute) (chronic): Secondary | ICD-10-CM | POA: Diagnosis not present

## 2021-01-01 DIAGNOSIS — Z5112 Encounter for antineoplastic immunotherapy: Secondary | ICD-10-CM | POA: Diagnosis not present

## 2021-01-01 LAB — CBC WITH DIFFERENTIAL/PLATELET
Abs Immature Granulocytes: 0.04 10*3/uL (ref 0.00–0.07)
Basophils Absolute: 0 10*3/uL (ref 0.0–0.1)
Basophils Relative: 0 %
Eosinophils Absolute: 0 10*3/uL (ref 0.0–0.5)
Eosinophils Relative: 0 %
HCT: 36.4 % (ref 36.0–46.0)
Hemoglobin: 12 g/dL (ref 12.0–15.0)
Immature Granulocytes: 1 %
Lymphocytes Relative: 15 %
Lymphs Abs: 1.2 10*3/uL (ref 0.7–4.0)
MCH: 29 pg (ref 26.0–34.0)
MCHC: 33 g/dL (ref 30.0–36.0)
MCV: 87.9 fL (ref 80.0–100.0)
Monocytes Absolute: 0.9 10*3/uL (ref 0.1–1.0)
Monocytes Relative: 11 %
Neutro Abs: 5.6 10*3/uL (ref 1.7–7.7)
Neutrophils Relative %: 73 %
Platelets: 368 10*3/uL (ref 150–400)
RBC: 4.14 MIL/uL (ref 3.87–5.11)
RDW: 17.2 % — ABNORMAL HIGH (ref 11.5–15.5)
WBC: 7.6 10*3/uL (ref 4.0–10.5)
nRBC: 0 % (ref 0.0–0.2)

## 2021-01-01 LAB — COMPREHENSIVE METABOLIC PANEL
ALT: 24 U/L (ref 0–44)
AST: 21 U/L (ref 15–41)
Albumin: 3.9 g/dL (ref 3.5–5.0)
Alkaline Phosphatase: 128 U/L — ABNORMAL HIGH (ref 38–126)
Anion gap: 11 (ref 5–15)
BUN: 14 mg/dL (ref 6–20)
CO2: 22 mmol/L (ref 22–32)
Calcium: 9.5 mg/dL (ref 8.9–10.3)
Chloride: 107 mmol/L (ref 98–111)
Creatinine, Ser: 0.44 mg/dL (ref 0.44–1.00)
GFR, Estimated: >60 mL/min (ref >60)
Glucose, Bld: 144 mg/dL — ABNORMAL HIGH (ref 70–99)
Potassium: 4 mmol/L (ref 3.5–5.1)
Sodium: 140 mmol/L (ref 135–145)
Total Bilirubin: 0.5 mg/dL (ref 0.3–1.2)
Total Protein: 7.6 g/dL (ref 6.5–8.1)

## 2021-01-01 MED ORDER — SODIUM CHLORIDE 0.9 % IV SOLN
650.0000 mg | Freq: Once | INTRAVENOUS | Status: AC
  Administered 2021-01-01: 650 mg via INTRAVENOUS
  Filled 2021-01-01: qty 65

## 2021-01-01 MED ORDER — HEPARIN SOD (PORK) LOCK FLUSH 100 UNIT/ML IV SOLN
500.0000 [IU] | Freq: Once | INTRAVENOUS | Status: AC | PRN
  Administered 2021-01-01: 500 [IU]
  Filled 2021-01-01: qty 5

## 2021-01-01 MED ORDER — PALONOSETRON HCL INJECTION 0.25 MG/5ML
0.2500 mg | Freq: Once | INTRAVENOUS | Status: AC
  Administered 2021-01-01: 0.25 mg via INTRAVENOUS
  Filled 2021-01-01: qty 5

## 2021-01-01 MED ORDER — HEPARIN SOD (PORK) LOCK FLUSH 100 UNIT/ML IV SOLN
INTRAVENOUS | Status: AC
  Filled 2021-01-01: qty 5

## 2021-01-01 MED ORDER — DEXAMETHASONE 4 MG PO TABS: ORAL_TABLET | ORAL | 1 refills | Status: DC

## 2021-01-01 MED ORDER — SODIUM CHLORIDE 0.9 % IV SOLN
150.0000 mg | Freq: Once | INTRAVENOUS | Status: AC
  Administered 2021-01-01: 150 mg via INTRAVENOUS
  Filled 2021-01-01: qty 150

## 2021-01-01 MED ORDER — SODIUM CHLORIDE 0.9 % IV SOLN
500.0000 mg/m2 | Freq: Once | INTRAVENOUS | Status: AC
  Administered 2021-01-01: 1000 mg via INTRAVENOUS
  Filled 2021-01-01: qty 40

## 2021-01-01 MED ORDER — DEXAMETHASONE SODIUM PHOSPHATE 100 MG/10ML IJ SOLN
10.0000 mg | Freq: Once | INTRAMUSCULAR | Status: AC
  Administered 2021-01-01: 10 mg via INTRAVENOUS
  Filled 2021-01-01: qty 10

## 2021-01-01 MED ORDER — SODIUM CHLORIDE 0.9 % IV SOLN
Freq: Once | INTRAVENOUS | Status: AC
  Filled 2021-01-01: qty 250

## 2021-01-01 MED ORDER — SODIUM CHLORIDE 0.9 % IV SOLN
200.0000 mg | Freq: Once | INTRAVENOUS | Status: AC
  Administered 2021-01-01: 200 mg via INTRAVENOUS
  Filled 2021-01-01: qty 8

## 2021-01-01 MED ORDER — SODIUM CHLORIDE 0.9% FLUSH
10.0000 mL | INTRAVENOUS | Status: DC | PRN
  Administered 2021-01-01: 10 mL
  Filled 2021-01-01: qty 10

## 2021-01-01 NOTE — Progress Notes (Signed)
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 She underwent operative fixation on 10/12/2020. MRI of femur showed heterogeneously enhancing osseous lesion within the area which would be nonspecific versus office neoplasm or metastatic lesion. CT chest abdomen and pelvis with contrast showed an enlarged pretracheal lymph node 2.2 x 1.5 cm and a right paratracheal lymph node measuring 2.7 x 1.3 cm. Prevascular node measuring 0.3 x 0.9 cm. 5 x 4 mm right middle lobe nodule. 2.8 x 1.9 cm left adrenal lesion.  Reamings from the right femur showed metastatic poorly differentiated carcinoma. Immunohistochemistry showed was positive for pancytokeratin, CK7 and patchy CK20 with patchy dim expression of TTF-1. Cells negative for Melan-A, CDX2, PAX8, Napsin A, GATA3, p40, CD56, p16 and thyroglobulin. Findings compatible with metastatic carcinoma but because of decalcification immunohistochemical staining is unreliable. Patchy dim staining with TTF-1 suspicious for lung primary but not a definitive diagnosis.  Repeat supraclavicular lymph node biopsy showed metastatic adenocarcinoma.  Tumor cells positive for CK7 with focal weak staining for TTF-1. Suggestive of lung origin in the proper clinical context. Cells were negative for GATA3 PAX8 CDX2 and CK20 and NapsinA.Foundation 1 liquid biopsy showed  Showed K-ras G12 C, PIK3 CA, KDA P1C171F, KDM 5CE 296   Interval history-patient reports doing well.  She still has pain in her right hip for which she uses as needed oxycodone.  Denies any shortness of breath.  ECOG PS- 1 Pain scale- 3 Opioid associated constipation- no  Review of systems- Review of Systems  Constitutional: Negative for chills, fever, malaise/fatigue and weight loss.  HENT: Negative for congestion, ear discharge and nosebleeds.   Eyes: Negative for blurred vision.  Respiratory: Negative for cough, hemoptysis, sputum production, shortness of breath and wheezing.   Cardiovascular: Negative for chest pain, palpitations, orthopnea and claudication.  Gastrointestinal: Negative for abdominal pain, blood in stool, constipation, diarrhea, heartburn, melena, nausea and vomiting.  Genitourinary: Negative for dysuria, flank pain, frequency, hematuria and urgency.  Musculoskeletal: Negative for back pain, joint pain and myalgias.       Right hip pain  Skin: Negative for rash.  Neurological: Negative for dizziness, tingling, focal weakness, seizures, weakness and headaches.  Endo/Heme/Allergies: Does not bruise/bleed easily.  Psychiatric/Behavioral: Negative for depression and suicidal ideas. The patient does not have insomnia.        Allergies  Allergen Reactions  . Factive [Gemifloxacin] Rash     Past Medical History:  Diagnosis Date  . Abnormal Pap smear of cervix   . Anxiety   . Depression   . Essential hypertension   . Femur fracture, right ([place])   . Palpitations   . Precordial chest pain      Past Surgical History:  Procedure Laterality Date  . BREAST BIOPSY Right 2017   benign  .  CERVICAL BIOPSY  W/ LOOP ELECTRODE EXCISION    .  COLONOSCOPY WITH PROPOFOL N/A 07/03/2015   Procedure: COLONOSCOPY WITH PROPOFOL;  Surgeon: Hulen Luster, MD;  Location: [facility] ENDOSCOPY;  Service: Gastroenterology;  Laterality: N/A;  . ESOPHAGOGASTRODUODENOSCOPY (EGD) WITH PROPOFOL N/A 07/03/2015   Procedure: ESOPHAGOGASTRODUODENOSCOPY (EGD) WITH PROPOFOL;  Surgeon: Hulen Luster, MD;  Location: [facility] ENDOSCOPY;  Service: Gastroenterology;  Laterality: N/A;  . INTRAMEDULLARY (IM) NAIL INTERTROCHANTERIC Right 09/15/2020   Procedure: INTRAMEDULLARY (IM) NAIL INTERTROCHANTRIC;  Surgeon: Corky Mull, MD;  Location: [facility] ORS;  Service: Orthopedics;  Laterality: Right;  . IR IMAGING GUIDED PORT INSERTION  11/28/2020  . LEFT HEART CATH AND CORONARY ANGIOGRAPHY N/A 03/28/2017   Procedure: Left Heart Cath and Coronary Angiography;  Surgeon: Lorretta Harp, MD;  Location: [facility];  Service: Cardiovascular;  Laterality: N/A;    Social History   Socioeconomic History  . Marital status: Married    Spouse name: Not on file  . Number of children: Not on file  . Years of education: Not on file  . Highest education level: Not on file  Occupational History  . Not on file  Tobacco Use  . Smoking status: Former Smoker    Packs/day: 1.00    Years: 30.00    Pack years: 30.00    Quit date: 09/17/2020    Years since quitting: 0.2  . Smokeless tobacco: Never Used  Vaping Use  . Vaping Use: Never used  Substance and Sexual Activity  . Alcohol use: Not Currently    Alcohol/week: 0.0 standard drinks  . Drug use: No  . Sexual activity: Yes    Birth control/protection: I.U.D., Post-menopausal  Other Topics Concern  . Not on file  Social History Narrative   Lives in [place] with her husband.  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   Social Determinants of Health   Financial Resource Strain: Not on file  Food Insecurity: Not on file  Transportation Needs: Not on file  Physical Activity: Not on file   Stress: Not on file  Social Connections: Not on file  Intimate Partner Violence: Not on file    Family History  Problem Relation Age of Onset  . Diabetes Mother        alive @ 44  . Goiter Mother   . Heart disease Father        died of MI @ 71  . Osteoporosis Maternal Grandmother   . Colon cancer Maternal Grandfather   . Breast cancer Neg Hx   . Ovarian cancer Neg Hx      Current Outpatient Medications:  .  acetaminophen (TYLENOL) 325 MG tablet, Take 1-2 tablets (325-650 mg total) by mouth every 6 (six) hours as needed for mild pain (pain score 1-3 or temp > 100.5)., Disp: , Rfl:  .  ALPRAZolam (XANAX) 0.5 MG tablet, Take 1 tablet (0.5 mg total) by mouth every 8 (eight) hours as needed for anxiety., Disp: 30 tablet, Rfl: 0 .  folic acid (FOLVITE) 1 MG tablet, Take 1 tablet (1 mg total) by mouth daily. Start 5-7 days before Alimta chemotherapy. Continue until 21 days after Alimta completed., Disp: 100 tablet, Rfl: 3 .  lidocaine-prilocaine (EMLA) cream, Apply to affected area once, Disp: 30 g, Rfl: 3 .  metoprolol succinate (TOPROL-XL) 25 MG 24 hr tablet, Take 25 mg by mouth daily., Disp: , Rfl:  .  omeprazole (PRILOSEC) 20 MG capsule, Take 1  capsule by mouth every morning., Disp: , Rfl:  .  ondansetron (ZOFRAN) 4 MG tablet, Take 1 tablet (4 mg total) by mouth every 8 (eight) hours as needed for nausea or vomiting., Disp: 20 tablet, Rfl: 1 .  oxyCODONE (OXY IR/ROXICODONE) 5 MG immediate release tablet, Take 1-2 tablets (5-10 mg total) by mouth every 4 (four) hours as needed for moderate pain., Disp: 60 tablet, Rfl: 0 .  polyethylene glycol (MIRALAX) 17 g packet, Take 17 g by mouth daily as needed for moderate constipation., Disp: 30 each, Rfl: 0 .  prochlorperazine (COMPAZINE) 10 MG tablet, Take 1 tablet (10 mg total) by mouth every 6 (six) hours as needed (Nausea or vomiting)., Disp: 30 tablet, Rfl: 1 .  dexamethasone (DECADRON) 4 MG tablet, Take 1 tab two times a day the day before  Alimta chemo, then take 2 tabs once a day for 3 days starting the day after cisplatin., Disp: 30 tablet, Rfl: 1 No current facility-administered medications for this visit.  Facility-Administered Medications Ordered in Other Visits:  .  sodium chloride flush (NS) 0.9 % injection 10 mL, 10 mL, Intracatheter, PRN, Sindy Guadeloupe, MD, 10 mL at 01/01/21 0948 .  Zoledronic Acid (ZOMETA) IVPB 4 mg, 4 mg, Intravenous, Q28 days, Sindy Guadeloupe, MD, Stopped at 11/21/20 1100  Physical exam:  Vitals:   01/01/21 0855  BP: 125/89  Pulse: 81  Resp: 20  Temp: 97.7 F (36.5 C)  TempSrc: Tympanic  SpO2: 99%  Weight: 194 lb (88 kg)   Physical Exam Constitutional:      General: She is not in acute distress.    Comments: Ambulates with a cane  Cardiovascular:     Rate and Rhythm: Normal rate.     Heart sounds: Normal heart sounds.  Pulmonary:     Effort: Pulmonary effort is normal.  Skin:    General: Skin is warm and dry.  Neurological:     Mental Status: She is alert and oriented to person, place, and time.      CMP Latest Ref Rng & Units 01/01/2021  Glucose 70 - 99 mg/dL 144(H)  BUN 6 - 20 mg/dL 14  Creatinine 0.44 - 1.00 mg/dL 0.44  Sodium 135 - 145 mmol/L 140  Potassium 3.5 - 5.1 mmol/L 4.0  Chloride 98 - 111 mmol/L 107  CO2 22 - 32 mmol/L 22  Calcium 8.9 - 10.3 mg/dL 9.5  Total Protein 6.5 - 8.1 g/dL 7.6  Total Bilirubin 0.3 - 1.2 mg/dL 0.5  Alkaline Phos 38 - 126 U/L 128(H)  AST 15 - 41 U/L 21  ALT 0 - 44 U/L 24   CBC Latest Ref Rng & Units 01/01/2021  WBC 4.0 - 10.5 K/uL 7.6  Hemoglobin 12.0 - 15.0 g/dL 12.0  Hematocrit 36.0 - 46.0 % 36.4  Platelets 150 - 400 K/uL 368     Assessment and plan- Patient is a 55 y.o. 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  She is here for on treatment assessment prior to cycle 4 of carbo Alimta Keytruda  chemotherapy  Counts okay to proceed with cycle 4 of carbo Alimta Keytruda chemotherapy today.  She already has scan scheduled in a couple of weeks.  I will see her back in 3 weeks for cycle 1 of maintenance Alimta and Keytruda.  She will also receive Zometa and B12 shot at that time.  Neoplasm related pain:  Continue as needed oxycodone    Visit Diagnosis 1. Encounter for antineoplastic chemotherapy   2. Encounter for antineoplastic immunotherapy   3. Primary malignant neoplasm of right lung metastatic to other site ([place])   4. Neoplasm related pain      Dr. Randa Evens, MD, MPH [facility] [phone] 01/01/2021 1:37 PM

## 2021-01-02 ENCOUNTER — Other Ambulatory Visit: Payer: 59

## 2021-01-02 ENCOUNTER — Ambulatory Visit: Payer: 59 | Admitting: Oncology

## 2021-01-02 ENCOUNTER — Ambulatory Visit: Payer: 59

## 2021-01-05 ENCOUNTER — Telehealth: Payer: Self-pay | Admitting: *Deleted

## 2021-01-05 NOTE — Telephone Encounter (Signed)
Ok to eat cooked seafood

## 2021-01-05 NOTE — Telephone Encounter (Signed)
Pt called in to ask if can eat cooked/fried seafood since is travelling to the beach today. She plans on eating shrimp, scallops, and crab legs if able. She understands that she cannot eat raw seafood but was asking if it is cooked if she could eat it.   Please advise.

## 2021-01-07 ENCOUNTER — Encounter: Payer: 59 | Admitting: Physical Therapy

## 2021-01-16 ENCOUNTER — Ambulatory Visit
Admission: RE | Admit: 2021-01-16 | Discharge: 2021-01-16 | Disposition: A | Discharge status: Home / Self Care | Payer: 59 | Source: Ambulatory Visit | Attending: Oncology | Admitting: Oncology

## 2021-01-16 ENCOUNTER — Encounter
Admission: RE | Admit: 2021-01-16 | Discharge: 2021-01-16 | Disposition: A | Discharge status: Home / Self Care | Payer: 59 | Source: Ambulatory Visit | Attending: Oncology | Admitting: Oncology

## 2021-01-16 ENCOUNTER — Other Ambulatory Visit: Payer: Self-pay

## 2021-01-16 ENCOUNTER — Other Ambulatory Visit: Payer: Self-pay | Admitting: Oncology

## 2021-01-16 DIAGNOSIS — C3491 Malignant neoplasm of unspecified part of right bronchus or lung: Secondary | ICD-10-CM

## 2021-01-16 DIAGNOSIS — Z5111 Encounter for antineoplastic chemotherapy: Secondary | ICD-10-CM

## 2021-01-16 MED ORDER — IOHEXOL 300 MG/ML  SOLN
100.0000 mL | Freq: Once | INTRAMUSCULAR | Status: AC | PRN
  Administered 2021-01-16: 100 mL via INTRAVENOUS

## 2021-01-16 MED ORDER — TECHNETIUM TC 99M MEDRONATE IV KIT
23.0700 | PACK | Freq: Once | INTRAVENOUS | Status: AC | PRN
  Administered 2021-01-16: 23.07 via INTRAVENOUS

## 2021-01-19 ENCOUNTER — Ambulatory Visit: Admission: RE | Admit: 2021-01-19 | Payer: 59 | Source: Ambulatory Visit

## 2021-01-20 ENCOUNTER — Other Ambulatory Visit: Payer: Self-pay

## 2021-01-20 ENCOUNTER — Ambulatory Visit
Admission: RE | Admit: 2021-01-20 | Discharge: 2021-01-20 | Disposition: A | Discharge status: Home / Self Care | Payer: 59 | Source: Ambulatory Visit | Attending: Oncology | Admitting: Oncology

## 2021-01-20 DIAGNOSIS — C3491 Malignant neoplasm of unspecified part of right bronchus or lung: Secondary | ICD-10-CM | POA: Insufficient documentation

## 2021-01-20 DIAGNOSIS — Z5111 Encounter for antineoplastic chemotherapy: Secondary | ICD-10-CM | POA: Diagnosis not present

## 2021-01-20 MED ORDER — GADOBUTROL 1 MMOL/ML IV SOLN
9.0000 mL | Freq: Once | INTRAVENOUS | Status: AC | PRN
  Administered 2021-01-20: 9 mL via INTRAVENOUS

## 2021-01-22 ENCOUNTER — Other Ambulatory Visit: Payer: Self-pay | Admitting: *Deleted

## 2021-01-22 ENCOUNTER — Encounter: Payer: Self-pay | Admitting: Oncology

## 2021-01-22 ENCOUNTER — Inpatient Hospital Stay: Payer: 59

## 2021-01-22 ENCOUNTER — Inpatient Hospital Stay: Payer: 59 | Attending: Oncology

## 2021-01-22 ENCOUNTER — Inpatient Hospital Stay (HOSPITAL_BASED_OUTPATIENT_CLINIC_OR_DEPARTMENT_OTHER): Payer: 59 | Admitting: Oncology

## 2021-01-22 VITALS — BP 138/86 | HR 93 | Temp 98.2°F | Resp 16 | Ht 67.0 in | Wt 198.0 lb

## 2021-01-22 DIAGNOSIS — C3491 Malignant neoplasm of unspecified part of right bronchus or lung: Secondary | ICD-10-CM | POA: Diagnosis not present

## 2021-01-22 DIAGNOSIS — C7951 Secondary malignant neoplasm of bone: Secondary | ICD-10-CM | POA: Insufficient documentation

## 2021-01-22 DIAGNOSIS — Z5112 Encounter for antineoplastic immunotherapy: Secondary | ICD-10-CM

## 2021-01-22 DIAGNOSIS — G893 Neoplasm related pain (acute) (chronic): Secondary | ICD-10-CM

## 2021-01-22 DIAGNOSIS — K760 Fatty (change of) liver, not elsewhere classified: Secondary | ICD-10-CM | POA: Diagnosis not present

## 2021-01-22 DIAGNOSIS — Z5111 Encounter for antineoplastic chemotherapy: Secondary | ICD-10-CM | POA: Insufficient documentation

## 2021-01-22 DIAGNOSIS — E785 Hyperlipidemia, unspecified: Secondary | ICD-10-CM | POA: Diagnosis not present

## 2021-01-22 DIAGNOSIS — Z79899 Other long term (current) drug therapy: Secondary | ICD-10-CM | POA: Diagnosis not present

## 2021-01-22 DIAGNOSIS — Z5181 Encounter for therapeutic drug level monitoring: Secondary | ICD-10-CM | POA: Diagnosis not present

## 2021-01-22 DIAGNOSIS — C8591 Non-Hodgkin lymphoma, unspecified, lymph nodes of head, face, and neck: Secondary | ICD-10-CM

## 2021-01-22 DIAGNOSIS — I700 Atherosclerosis of aorta: Secondary | ICD-10-CM | POA: Diagnosis not present

## 2021-01-22 DIAGNOSIS — N200 Calculus of kidney: Secondary | ICD-10-CM | POA: Insufficient documentation

## 2021-01-22 DIAGNOSIS — Z7983 Long term (current) use of bisphosphonates: Secondary | ICD-10-CM

## 2021-01-22 DIAGNOSIS — I10 Essential (primary) hypertension: Secondary | ICD-10-CM | POA: Insufficient documentation

## 2021-01-22 DIAGNOSIS — Z87891 Personal history of nicotine dependence: Secondary | ICD-10-CM | POA: Insufficient documentation

## 2021-01-22 DIAGNOSIS — Z800 Family history of malignant neoplasm of digestive organs: Secondary | ICD-10-CM | POA: Diagnosis not present

## 2021-01-22 DIAGNOSIS — C7972 Secondary malignant neoplasm of left adrenal gland: Secondary | ICD-10-CM | POA: Diagnosis not present

## 2021-01-22 LAB — CBC WITH DIFFERENTIAL/PLATELET
Abs Immature Granulocytes: 0.05 10*3/uL (ref 0.00–0.07)
Basophils Absolute: 0 10*3/uL (ref 0.0–0.1)
Basophils Relative: 0 %
Eosinophils Absolute: 0 10*3/uL (ref 0.0–0.5)
Eosinophils Relative: 0 %
HCT: 34.8 % — ABNORMAL LOW (ref 36.0–46.0)
Hemoglobin: 11.7 g/dL — ABNORMAL LOW (ref 12.0–15.0)
Immature Granulocytes: 1 %
Lymphocytes Relative: 12 %
Lymphs Abs: 0.9 10*3/uL (ref 0.7–4.0)
MCH: 29.8 pg (ref 26.0–34.0)
MCHC: 33.6 g/dL (ref 30.0–36.0)
MCV: 88.8 fL (ref 80.0–100.0)
Monocytes Absolute: 0.6 10*3/uL (ref 0.1–1.0)
Monocytes Relative: 7 %
Neutro Abs: 6.4 10*3/uL (ref 1.7–7.7)
Neutrophils Relative %: 80 %
Platelets: 382 10*3/uL (ref 150–400)
RBC: 3.92 MIL/uL (ref 3.87–5.11)
RDW: 17.8 % — ABNORMAL HIGH (ref 11.5–15.5)
WBC: 8 10*3/uL (ref 4.0–10.5)
nRBC: 0 % (ref 0.0–0.2)

## 2021-01-22 LAB — COMPREHENSIVE METABOLIC PANEL
ALT: 20 U/L (ref 0–44)
AST: 18 U/L (ref 15–41)
Albumin: 3.8 g/dL (ref 3.5–5.0)
Alkaline Phosphatase: 115 U/L (ref 38–126)
Anion gap: 11 (ref 5–15)
BUN: 15 mg/dL (ref 6–20)
CO2: 21 mmol/L — ABNORMAL LOW (ref 22–32)
Calcium: 9 mg/dL (ref 8.9–10.3)
Chloride: 106 mmol/L (ref 98–111)
Creatinine, Ser: 0.73 mg/dL (ref 0.44–1.00)
GFR, Estimated: >60 mL/min (ref >60)
Glucose, Bld: 172 mg/dL — ABNORMAL HIGH (ref 70–99)
Potassium: 3.9 mmol/L (ref 3.5–5.1)
Sodium: 138 mmol/L (ref 135–145)
Total Bilirubin: 0.4 mg/dL (ref 0.3–1.2)
Total Protein: 7.6 g/dL (ref 6.5–8.1)

## 2021-01-22 MED ORDER — CYANOCOBALAMIN 1000 MCG/ML IJ SOLN
1000.0000 ug | INTRAMUSCULAR | Status: DC
  Administered 2021-01-22: 1000 ug via INTRAMUSCULAR
  Filled 2021-01-22: qty 1

## 2021-01-22 MED ORDER — SODIUM CHLORIDE 0.9 % IV SOLN
Freq: Once | INTRAVENOUS | Status: AC
  Filled 2021-01-22: qty 250

## 2021-01-22 MED ORDER — FOLIC ACID 1 MG PO TABS: 1.0000 mg | ORAL_TABLET | Freq: Every day | ORAL | 1 refills | Status: DC

## 2021-01-22 MED ORDER — PROCHLORPERAZINE MALEATE 10 MG PO TABS: 10.0000 mg | ORAL_TABLET | Freq: Four times a day (QID) | ORAL | 1 refills | Status: DC | PRN

## 2021-01-22 MED ORDER — HEPARIN SOD (PORK) LOCK FLUSH 100 UNIT/ML IV SOLN
INTRAVENOUS | Status: AC
  Filled 2021-01-22: qty 5

## 2021-01-22 MED ORDER — SODIUM CHLORIDE 0.9 % IV SOLN
200.0000 mg | Freq: Once | INTRAVENOUS | Status: AC
  Administered 2021-01-22: 200 mg via INTRAVENOUS
  Filled 2021-01-22: qty 8

## 2021-01-22 MED ORDER — OXYCODONE HCL 5 MG PO TABS: 5.0000 mg | ORAL_TABLET | ORAL | 0 refills | Status: DC | PRN

## 2021-01-22 MED ORDER — ZOLEDRONIC ACID 4 MG/100ML IV SOLN
4.0000 mg | INTRAVENOUS | Status: DC
  Administered 2021-01-22: 4 mg via INTRAVENOUS
  Filled 2021-01-22: qty 100

## 2021-01-22 MED ORDER — PALONOSETRON HCL INJECTION 0.25 MG/5ML
0.2500 mg | Freq: Once | INTRAVENOUS | Status: AC
  Administered 2021-01-22: 0.25 mg via INTRAVENOUS
  Filled 2021-01-22: qty 5

## 2021-01-22 MED ORDER — SODIUM CHLORIDE 0.9% FLUSH
10.0000 mL | Freq: Once | INTRAVENOUS | Status: AC
  Administered 2021-01-22: 10 mL via INTRAVENOUS
  Filled 2021-01-22: qty 10

## 2021-01-22 MED ORDER — SODIUM CHLORIDE 0.9 % IV SOLN: 10.0000 mg | Freq: Once | INTRAVENOUS | Status: DC

## 2021-01-22 MED ORDER — ONDANSETRON HCL 4 MG PO TABS: 4.0000 mg | ORAL_TABLET | Freq: Three times a day (TID) | ORAL | 1 refills | Status: DC | PRN

## 2021-01-22 MED ORDER — HEPARIN SOD (PORK) LOCK FLUSH 100 UNIT/ML IV SOLN
500.0000 [IU] | Freq: Once | INTRAVENOUS | Status: AC | PRN
  Administered 2021-01-22: 500 [IU]
  Filled 2021-01-22: qty 5

## 2021-01-22 MED ORDER — SODIUM CHLORIDE 0.9 % IV SOLN
500.0000 mg/m2 | Freq: Once | INTRAVENOUS | Status: AC
  Administered 2021-01-22: 1000 mg via INTRAVENOUS
  Filled 2021-01-22: qty 40

## 2021-01-22 MED ORDER — SODIUM CHLORIDE 0.9 % IV SOLN: 150.0000 mg | Freq: Once | INTRAVENOUS | Status: DC

## 2021-01-22 NOTE — Progress Notes (Signed)
Aloxi,dex and emend ordered with keytruda/alimta alone.  Just do aloxi only per MD

## 2021-01-22 NOTE — Progress Notes (Signed)
Pt received b12 injection, zometa infusion, and alimta/keytruda treatment in clinic today. Tolerated well. No complaints at d/c.

## 2021-01-22 NOTE — Progress Notes (Signed)
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 She underwent operative fixation on 10/12/2020. MRI of femur showed heterogeneously enhancing osseous lesion within the area which would be nonspecific versus office neoplasm or metastatic lesion. CT chest abdomen and pelvis with contrast showed an enlarged pretracheal lymph node 2.2 x 1.5 cm and a right paratracheal lymph node measuring 2.7 x 1.3 cm. Prevascular node measuring 0.3 x 0.9 cm. 5 x 4 mm right middle lobe nodule. 2.8 x 1.9 cm left adrenal lesion.  Reamings from the right femur showed metastatic poorly differentiated carcinoma. Immunohistochemistry showed was positive for pancytokeratin, CK7 and patchy CK20 with patchy dim expression of TTF-1. Cells negative for Melan-A, CDX2, PAX8, Napsin A, GATA3, p40, CD56, p16 and thyroglobulin. Findings compatible with metastatic carcinoma but because of decalcification immunohistochemical staining is unreliable. Patchy dim staining with TTF-1 suspicious for lung primary but not a definitive diagnosis.  Repeat supraclavicular  lymph node biopsy showed metastatic adenocarcinoma. Tumor cells positive for CK7 with focal weak staining for TTF-1. Suggestive of lung origin in the proper clinical context. Cells were negative for GATA3 PAX8 CDX2 and CK20 and NapsinA.Foundation 1 liquid biopsyshowedShowed K-ras G12 C, PIK3 CA, KDA P1C171F, KDM 5CE 296   Interval history-patient reports feeling well.  She still has some pain in her right thigh but she is able to ambulate without a cane.  ECOG PS- 1 Pain scale- 0 Opioid associated constipation- no  Review of systems- Review of Systems  Constitutional: Positive for malaise/fatigue. Negative for chills, fever and weight loss.  HENT: Negative for congestion, ear discharge and nosebleeds.   Eyes: Negative for blurred vision.  Respiratory: Negative for cough, hemoptysis, sputum production, shortness of breath and wheezing.   Cardiovascular: Negative for chest pain, palpitations, orthopnea and claudication.  Gastrointestinal: Negative for abdominal pain, blood in stool, constipation, diarrhea, heartburn, melena, nausea and vomiting.  Genitourinary: Negative for dysuria, flank pain, frequency, hematuria and urgency.  Musculoskeletal: Negative for back pain, joint pain and myalgias.  Skin: Negative for rash.  Neurological: Negative for dizziness, tingling, focal weakness, seizures, weakness and headaches.  Endo/Heme/Allergies: Does not bruise/bleed easily.  Psychiatric/Behavioral: Negative for depression and suicidal ideas. The patient does not have insomnia.       Allergies  Allergen Reactions  . Factive [Gemifloxacin] Rash     Past Medical History:  Diagnosis Date  . Abnormal Pap smear of cervix   . Anxiety   . Depression   . Essential hypertension   . Femur fracture, right ([place])   . Palpitations   . Precordial chest pain      Past Surgical History:  Procedure Laterality Date  . BREAST BIOPSY Right 2017   benign  . CERVICAL BIOPSY  W/ LOOP  ELECTRODE EXCISION    . COLONOSCOPY WITH PROPOFOL N/A 07/03/2015   Procedure: COLONOSCOPY WITH PROPOFOL;  Surgeon: Hulen Luster, MD;  Location: [facility] ENDOSCOPY;  Service: Gastroenterology;  Laterality: N/A;  . ESOPHAGOGASTRODUODENOSCOPY (EGD) WITH PROPOFOL N/A 07/03/2015   Procedure: ESOPHAGOGASTRODUODENOSCOPY (EGD) WITH PROPOFOL;  Surgeon: Hulen Luster, MD;  Location: [facility] ENDOSCOPY;  Service: Gastroenterology;  Laterality: N/A;  . INTRAMEDULLARY (IM) NAIL INTERTROCHANTERIC Right 09/15/2020   Procedure: INTRAMEDULLARY (IM) NAIL INTERTROCHANTRIC;  Surgeon: Corky Mull, MD;  Location: [facility] ORS;  Service: Orthopedics;  Laterality: Right;  . IR IMAGING GUIDED PORT INSERTION  11/28/2020  . LEFT HEART CATH AND CORONARY ANGIOGRAPHY N/A 03/28/2017   Procedure: Left Heart Cath and Coronary Angiography;  Surgeon: Lorretta Harp, MD;  Location: [facility];  Service: Cardiovascular;  Laterality: N/A;    Social History   Socioeconomic History  . Marital status: Married    Spouse name: Not on file  . Number of children: Not on file  . Years of education: Not on file  . Highest education level: Not on file  Occupational History  . Not on file  Tobacco Use  . Smoking status: Former Smoker    Packs/day: 1.00    Years: 30.00    Pack years: 30.00    Quit date: 09/17/2020    Years since quitting: 0.3  . Smokeless tobacco: Never Used  Vaping Use  . Vaping Use: Never used  Substance and Sexual Activity  . Alcohol use: Not Currently    Alcohol/week: 0.0 standard drinks  . Drug use: No  . Sexual activity: Yes    Birth control/protection: I.U.D., Post-menopausal  Other Topics Concern  . Not on file  Social History Narrative   Lives in [place] with her husband.  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   Social Determinants of Health   Financial Resource Strain: Not on file  Food Insecurity: Not on file  Transportation Needs: Not on file  Physical  Activity: Not on file  Stress: Not on file  Social Connections: Not on file  Intimate Partner Violence: Not on file    Family History  Problem Relation Age of Onset  . Diabetes Mother        alive @ 52  . Goiter Mother   . Heart disease Father        died of MI @ 67  . Osteoporosis Maternal Grandmother   . Colon cancer Maternal Grandfather   . Breast cancer Neg Hx   . Ovarian cancer Neg Hx      Current Outpatient Medications:  .  acetaminophen (TYLENOL) 325 MG tablet, Take 1-2 tablets (325-650 mg total) by mouth every 6 (six) hours as needed for mild pain (pain score 1-3 or temp > 100.5)., Disp: , Rfl:  .  ALPRAZolam (XANAX) 0.5 MG tablet, Take 1 tablet (0.5 mg total) by mouth every 8 (eight) hours as needed for anxiety., Disp: 30 tablet, Rfl: 0 .  dexamethasone (DECADRON) 4 MG tablet, Take 1 tab two times a day the day before Alimta chemo, then take 2 tabs once a day for 3 days starting the day after cisplatin., Disp: 30 tablet, Rfl: 1 .  folic acid (FOLVITE) 1 MG tablet, Take 1 tablet (1 mg total) by mouth daily. Start 5-7 days before Alimta chemotherapy. Continue until 21 days after Alimta completed., Disp: 100 tablet, Rfl: 3 .  lidocaine-prilocaine (EMLA) cream, Apply to affected area once, Disp:  30 g, Rfl: 3 .  metoprolol succinate (TOPROL-XL) 25 MG 24 hr tablet, Take 25 mg by mouth daily., Disp: , Rfl:  .  omeprazole (PRILOSEC) 20 MG capsule, Take 1 capsule by mouth every morning., Disp: , Rfl:  .  ondansetron (ZOFRAN) 4 MG tablet, Take 1 tablet (4 mg total) by mouth every 8 (eight) hours as needed for nausea or vomiting., Disp: 20 tablet, Rfl: 1 .  oxyCODONE (OXY IR/ROXICODONE) 5 MG immediate release tablet, Take 1-2 tablets (5-10 mg total) by mouth every 4 (four) hours as needed for moderate pain., Disp: 60 tablet, Rfl: 0 .  polyethylene glycol (MIRALAX) 17 g packet, Take 17 g by mouth daily as needed for moderate constipation., Disp: 30 each, Rfl: 0 .  prochlorperazine  (COMPAZINE) 10 MG tablet, Take 1 tablet (10 mg total) by mouth every 6 (six) hours as needed (Nausea or vomiting)., Disp: 30 tablet, Rfl: 1 No current facility-administered medications for this visit.  Facility-Administered Medications Ordered in Other Visits:  .  Zoledronic Acid (ZOMETA) IVPB 4 mg, 4 mg, Intravenous, Q28 days, Sindy Guadeloupe, MD, Stopped at 11/21/20 1100  Physical exam:  Vitals:   01/22/21 0845  BP: 138/86  Pulse: 93  Resp: 16  Temp: 98.2 F (36.8 C)  TempSrc: Oral  Weight: 198 lb (89.8 kg)  Height: 5' 7"  (1.702 m)   Physical Exam HENT:     Head: Normocephalic and atraumatic.  Eyes:     Pupils: Pupils are equal, round, and reactive to light.  Cardiovascular:     Rate and Rhythm: Normal rate and regular rhythm.     Heart sounds: Normal heart sounds.  Pulmonary:     Effort: Pulmonary effort is normal.     Breath sounds: Normal breath sounds.  Abdominal:     General: Bowel sounds are normal.     Palpations: Abdomen is soft.  Musculoskeletal:     Cervical back: Normal range of motion.  Skin:    General: Skin is warm and dry.  Neurological:     Mental Status: She is alert and oriented to person, place, and time.      CMP Latest Ref Rng & Units 01/22/2021  Glucose 70 - 99 mg/dL 172(H)  BUN 6 - 20 mg/dL 15  Creatinine 0.44 - 1.00 mg/dL 0.73  Sodium 135 - 145 mmol/L 138  Potassium 3.5 - 5.1 mmol/L 3.9  Chloride 98 - 111 mmol/L 106  CO2 22 - 32 mmol/L 21(L)  Calcium 8.9 - 10.3 mg/dL 9.0  Total Protein 6.5 - 8.1 g/dL 7.6  Total Bilirubin 0.3 - 1.2 mg/dL 0.4  Alkaline Phos 38 - 126 U/L 115  AST 15 - 41 U/L 18  ALT 0 - 44 U/L 20   CBC Latest Ref Rng & Units 01/22/2021  WBC 4.0 - 10.5 K/uL 8.0  Hemoglobin 12.0 - 15.0 g/dL 11.7(L)  Hematocrit 36.0 - 46.0 % 34.8(L)  Platelets 150 - 400 K/uL 382    No images are attached to the encounter.  MR Brain W Wo Contrast  Result Date: 01/21/2021 CLINICAL DATA:  55 year old female with metastatic lung cancer.  Two possible punctate/early brain metastases on January MRI. Known skull/skeletal metastases. EXAM: MRI HEAD WITHOUT AND WITH CONTRAST TECHNIQUE: Multiplanar, multiecho pulse sequences of the brain and surrounding structures were obtained without and with intravenous contrast. CONTRAST:  73m GADAVIST GADOBUTROL 1 MMOL/ML IV SOLN COMPARISON:  Nuclear Medicine whole-body bone scan 01/16/2021. Brain MRI 10/01/2020. FINDINGS: Brain: The 2 punctate foci of  right frontal lobe enhancement seen on prior coronal and sagittal postcontrast images do not persist. No edema or signal abnormality in those areas. Small developmental venous anomaly redemonstrated in the anterior right frontal lobe (series 19, image 24), normal variant. No abnormal enhancement of the brain today. No dural thickening. No restricted diffusion to suggest acute infarction. No midline shift, mass effect, evidence of mass lesion, ventriculomegaly, extra-axial collection or acute intracranial hemorrhage. Cervicomedullary junction and pituitary are within normal limits. Normal cerebral volume. Pearline Cables and white matter signal is within normal limits for age throughout the brain. No encephalomalacia or chronic cerebral blood products. Vascular: Major intracranial vascular flow voids are stable. The major dural venous sinuses are enhancing and appear patent. Skull and upper cervical spine: Heterogeneously enhancing and FLAIR hyperintense right posterior skull lesion, approximately 41 mm (series 15, image 35 and series 18, image 114) corresponding to the recent nuclear medicine bone scan abnormality. No underlying dural thickening identified. No other bone metastasis identified in the head. Negative visible spinal cord. Sinuses/Orbits: Stable and negative. Other: Mastoids remain clear. Visible internal auditory structures appear normal. Negative visible scalp and face. IMPRESSION: 1. The two punctate foci of right frontal lobe enhancement in January have resolved.  These might be tiny treated metastases, or might have been artifact. No metastatic disease to the brain today. 2. However, there is a solitary right posterior skull metastasis measuring 41 mm corresponding to the recent nuclear medicine bone scan abnormality. No evidence of dural involvement at this time. Electronically Signed   By: Genevie Ann M.D.   On: 01/21/2021 06:35   NM Bone Scan Whole Body  Result Date: 01/19/2021 CLINICAL DATA:  Metastatic lung carcinoma EXAM: NUCLEAR MEDICINE WHOLE BODY BONE SCAN TECHNIQUE: Whole body anterior and posterior images were obtained approximately 3 hours after intravenous injection of radiopharmaceutical. RADIOPHARMACEUTICALS:  23.07 mCi Technetium-68mMDP IV COMPARISON:  CT chest, abdomen, and pelvis Jan 16, 2021 FINDINGS: There is intense increased uptake in the lateral left iliac crest at the site of metastatic focus on CT. There is extensive abnormal uptake throughout the right femur. Note that there is a rod within the right femur with increased uptake surrounding the rod and distal to the rod. There is an area of increased uptake in the right skull at the parieto-occipital junction. Increased uptake in knees, ankles, and shoulders is likely of arthropathic etiology. Increased uptake in the paranasal sinus regions is likely due to paranasal sinus disease. The distribution of radiotracer uptake elsewhere is otherwise unremarkable. Renal uptake noted in the flank positions bilaterally, a physiologic finding. IMPRESSION: Evidence consistent with metastatic disease in the right skull near the parieto-occipital junction, left lateral iliac crest common and along the right femur. Note that there is a rod within the right femur with photopenia in the area of the rod. Electronically Signed   By: WLowella GripIII M.D.   On: 01/19/2021 14:13   CT CHEST ABDOMEN PELVIS W CONTRAST  Result Date: 01/19/2021 CLINICAL DATA:  Lung cancer on chemotherapy. EXAM: CT CHEST, ABDOMEN, AND  PELVIS WITH CONTRAST TECHNIQUE: Multidetector CT imaging of the chest, abdomen and pelvis was performed following the standard protocol during bolus administration of intravenous contrast. CONTRAST:  1059mOMNIPAQUE IOHEXOL 300 MG/ML  SOLN COMPARISON:  PET 10/02/2020 and CT chest abdomen pelvis 09/15/2020. FINDINGS: CT CHEST FINDINGS Cardiovascular: Right IJ Port-A-Cath terminates in the right atrium. Heart size normal. No pericardial effusion. Mediastinum/Nodes: Low-attenuation lesions in right thyroid measure up to 12 mm. No follow-up recommended. (  Ref: J Am Coll Radiol. 2015 Feb;12(2): 143-50).High right paratracheal lymph node has decreased in size, measuring 10 mm, compared to 13 mm on 09/15/2020. Similarly, low paratracheal and AP window lymph nodes have decreased in size. Index low right paratracheal lymph node measures 9 mm, compared to 1.5 cm on 09/15/2020. Hilar lymph nodes are not enlarged by CT size criteria. No axillary adenopathy. Esophagus is grossly unremarkable. Lungs/Pleura: 4 mm lateral segment right middle lobe nodule (4/90), unchanged. Minimal dependent atelectasis bilaterally. Lungs are otherwise clear. No pleural fluid. Airway is unremarkable. Musculoskeletal: Degenerative changes in the spine. Old left rib fracture. No worrisome lytic or sclerotic lesions. CT ABDOMEN PELVIS FINDINGS Hepatobiliary: Liver is decreased in attenuation diffusely. 12 mm low-attenuation lesion in the dome of the liver is unchanged but too small to characterize. Liver and gallbladder are otherwise unremarkable. No biliary ductal dilatation. Pancreas: Negative. Spleen: Negative. Adrenals/Urinary Tract: Right adrenal gland is unremarkable. Left adrenal metastasis has decreased in size, measuring 1.3 x 2.5 cm, compared to 1.9 x 2.8 cm on 09/15/2020. Small stone in the right kidney. Subcentimeter low-attenuation lesion in the left kidney is too small to characterize but statistically, a cyst is likely. Ureters are  decompressed. Bladder is low in volume. Stomach/Bowel: Stomach, small bowel and appendix are unremarkable. Fair amount of stool is seen in the colon. There is wall thickening and pericolonic inflammatory stranding/fluid involving the distal descending colon, new from 09/15/2020. Vascular/Lymphatic: Atherosclerotic calcification of the aorta. Periportal, retroperitoneal and small bowel mesenteric lymph nodes are not enlarged by CT size criteria. Reproductive: Uterus is visualized.  No adnexal mass. Other: No free fluid.  Mesenteries and peritoneum are unremarkable. Musculoskeletal: Slight lucency in the anterior left iliac wing with surrounding periosteal reaction. IM rod and dynamic hip screw fixation of an expansile lytic lesion in the right femoral shaft, incompletely visualized. Lucency within the left sacrum appears similar and was not shown to be hypermetabolic on 59/16/3846. Degenerative changes in the spine. Minimal grade 1 anterolisthesis of L4 on L5. S1 is transitional. IMPRESSION: 1. Interval decrease in size of mediastinal adenopathy and left adrenal metastasis. Right adrenal gland is normal in appearance. 2. Stable osseous metastatic disease involving the left iliac wing and right femur. 3. Right middle lobe nodule is stable. 4. Thickening of the wall of the descending colon with surrounding inflammatory stranding and fluid, indicative of diverticulitis. 5. Hepatic steatosis. 6. Right renal stone. 7.  Aortic atherosclerosis (ICD10-I70.0). Electronically Signed   By: Lorin Picket M.D.   On: 01/19/2021 08:25     Assessment and plan- Patient is a 55 y.o. female with stage IV adenocarcinoma of the lung with metastases to bone and adrenal gland.  She is s/p 4 cycles of carbo Alimta Keytruda chemotherapy and here for on treatment assessment prior to cycle 1 of maintenance Alimta and Keytruda  I reviewed MRI brain images independently and discussed findings with the patient.  Patient was found to have  2 punctate foci of right frontal lobe enhancement in January.  At present they are resolved and an artifact has not been ruled out.  I would therefore hold off on offering her any whole brain radiation at this time and plan to repeat an MRI in 3 months  I have also reviewed CT chest abdomen pelvis And bone scan images independently and discussed findings with the patient.  Overall there has been decrease in the size of paratracheal and AP window adenopathy.  4 mm right middle lobe nodule unchanged.  Decrease in  the size of left adrenal metastases from 1.9 x 2.8 cm to 1.3 x 2.5 cm.  Bone scan also showed stable osseous metastatic disease.  Plan is to continue maintenance Alimta and Keytruda until progression or toxicity.  Patient will proceed with cycle 1 of maintenance Alimta and Keytruda today.  She will also receive Zometa and B12 shot today.  She will see covering MD/NP for cycle 2 and I will see her back in 6 weeks for cycle 3 and she will receive Zometa with cycle 3 as well.  Neoplasm related pain: Oxycodone prescription renewed   Visit Diagnosis 1. Primary malignant neoplasm of right lung metastatic to other site [facility])   2. Encounter for antineoplastic immunotherapy   3. Encounter for monitoring zoledronic acid therapy   4. Bone metastases ([place])   5. Neoplasm related pain      Dr. Randa Evens, MD, MPH [facility] at [facility] [phone] 01/22/2021 8:30 AM

## 2021-01-22 NOTE — Patient Instructions (Addendum)
[facility]    Discharge Instructions: Thank you for choosing [facility] to provide your oncology and hematology care.  If you have a lab appointment with the [facility], please go directly to the [facility] and check in at the registration area.  Wear comfortable clothing and clothing appropriate for easy access to any Portacath or PICC line.   We strive to give you quality time with your provider. You may need to reschedule your appointment if you arrive late (15 or more minutes).  Arriving late affects you and other patients whose appointments are after yours.  Also, if you miss three or more appointments without notifying the office, you may be dismissed from the clinic at the provider's discretion.      For prescription refill requests, have your pharmacy contact our office and allow 72 hours for refills to be completed.    Today you received the following chemotherapy and/or immunotherapy agents - alimta, Beryle Flock   To help prevent nausea and vomiting after your treatment, we encourage you to take your nausea medication as directed.  BELOW ARE SYMPTOMS THAT SHOULD BE REPORTED IMMEDIATELY: . *FEVER GREATER THAN 100.4 F (38 C) OR HIGHER . *CHILLS OR SWEATING . *NAUSEA AND VOMITING THAT IS NOT CONTROLLED WITH YOUR NAUSEA MEDICATION . *UNUSUAL SHORTNESS OF BREATH . *UNUSUAL BRUISING OR BLEEDING . *URINARY PROBLEMS (pain or burning when urinating, or frequent urination) . *BOWEL PROBLEMS (unusual diarrhea, constipation, pain near the anus) . TENDERNESS IN MOUTH AND THROAT WITH OR WITHOUT PRESENCE OF ULCERS (sore throat, sores in mouth, or a toothache) . UNUSUAL RASH, SWELLING OR PAIN  . UNUSUAL VAGINAL DISCHARGE OR ITCHING   Items with * indicate a potential emergency and should be followed up as soon as possible or go to the Emergency Department if any problems should occur.  Please show the CHEMOTHERAPY ALERT CARD or  IMMUNOTHERAPY ALERT CARD at check-in to the Emergency Department and triage nurse.  Should you have questions after your visit or need to cancel or reschedule your appointment, please contact [facility]  [phone] and follow the prompts.  Office hours are 8:00 a.m. to 4:30 p.m. Monday - Friday. Please note that voicemails left after 4:00 p.m. may not be returned until the following business day.  We are closed weekends and major holidays. You have access to a nurse at all times for urgent questions. Please call the main number to the clinic [phone] and follow the prompts.  For any non-urgent questions, you may also contact your provider using MyChart. We now offer e-Visits for anyone 50 and older to request care online for non-urgent symptoms. For details visit [email or web address].   Also download the MyChart app! Go to the app store, search "MyChart", open the app, select [facility], and log in with your MyChart username and password.  Due to Covid, a mask is required upon entering the hospital/clinic. If you do not have a mask, one will be given to you upon arrival. For doctor visits, patients may have 1 support person aged 68 or older with them. For treatment visits, patients cannot have anyone with them due to current Covid guidelines and our immunocompromised population.   Pembrolizumab injection What is this medicine? PEMBROLIZUMAB (pem broe liz ue mab) is a monoclonal antibody. It is used to treat certain types of cancer. This medicine may be used for other purposes; ask your health care provider or pharmacist if you have questions.  COMMON BRAND NAME(S): Keytruda What should I tell my health care provider before I take this medicine? They need to know if you have any of these conditions:  autoimmune diseases like Crohn's disease, ulcerative colitis, or lupus  have had or planning to have an allogeneic stem cell transplant (uses someone  else's stem cells)  history of organ transplant  history of chest radiation  nervous system problems like myasthenia gravis or Guillain-Barre syndrome  an unusual or allergic reaction to pembrolizumab, other medicines, foods, dyes, or preservatives  pregnant or trying to get pregnant  breast-feeding How should I use this medicine? This medicine is for infusion into a vein. It is given by a health care professional in a hospital or clinic setting. A special MedGuide will be given to you before each treatment. Be sure to read this information carefully each time. Talk to your pediatrician regarding the use of this medicine in children. While this drug may be prescribed for children as young as 6 months for selected conditions, precautions do apply. Overdosage: If you think you have taken too much of this medicine contact a poison control center or emergency room at once. NOTE: This medicine is only for you. Do not share this medicine with others. What if I miss a dose? It is important not to miss your dose. Call your doctor or health care professional if you are unable to keep an appointment. What may interact with this medicine? Interactions have not been studied. This list may not describe all possible interactions. Give your health care provider a list of all the medicines, herbs, non-prescription drugs, or dietary supplements you use. Also tell them if you smoke, drink alcohol, or use illegal drugs. Some items may interact with your medicine. What should I watch for while using this medicine? Your condition will be monitored carefully while you are receiving this medicine. You may need blood work done while you are taking this medicine. Do not become pregnant while taking this medicine or for 4 months after stopping it. Women should inform their doctor if they wish to become pregnant or think they might be pregnant. There is a potential for serious side effects to an unborn child. Talk  to your health care professional or pharmacist for more information. Do not breast-feed an infant while taking this medicine or for 4 months after the last dose. What side effects may I notice from receiving this medicine? Side effects that you should report to your doctor or health care professional as soon as possible:  allergic reactions like skin rash, itching or hives, swelling of the face, lips, or tongue  bloody or black, tarry  breathing problems  changes in vision  chest pain  chills  confusion  constipation  cough  diarrhea  dizziness or feeling faint or lightheaded  fast or irregular heartbeat  fever  flushing  joint pain  low blood counts - this medicine may decrease the number of Jovanie Verge blood cells, red blood cells and platelets. You may be at increased risk for infections and bleeding.  muscle pain  muscle weakness  pain, tingling, numbness in the hands or feet  persistent headache  redness, blistering, peeling or loosening of the skin, including inside the mouth  signs and symptoms of high blood sugar such as dizziness; dry mouth; dry skin; fruity breath; nausea; stomach pain; increased hunger or thirst; increased urination  signs and symptoms of kidney injury like trouble passing urine or change in the amount of urine  signs  and symptoms of liver injury like dark urine, light-colored stools, loss of appetite, nausea, right upper belly pain, yellowing of the eyes or skin  sweating  swollen lymph nodes  weight loss Side effects that usually do not require medical attention (report to your doctor or health care professional if they continue or are bothersome):  decreased appetite  hair loss  tiredness This list may not describe all possible side effects. Call your doctor for medical advice about side effects. You may report side effects to FDA at 1-800-FDA-1088. Where should I keep my medicine? This drug is given in a hospital or clinic  and will not be stored at home. NOTE: This sheet is a summary. It may not cover all possible information. If you have questions about this medicine, talk to your doctor, pharmacist, or health care provider.  2021 Elsevier/Gold Standard (2019-08-01 21:44:53)  Pemetrexed injection What is this medicine? PEMETREXED (PEM e TREX ed) is a chemotherapy drug used to treat lung cancers like non-small cell lung cancer and mesothelioma. It may also be used to treat other cancers. This medicine may be used for other purposes; ask your health care provider or pharmacist if you have questions. COMMON BRAND NAME(S): Alimta What should I tell my health care provider before I take this medicine? They need to know if you have any of these conditions:  infection (especially a virus infection such as chickenpox, cold sores, or herpes)  kidney disease  low blood counts, like low Chasitee Zenker cell, platelet, or red cell counts  lung or breathing disease, like asthma  radiation therapy  an unusual or allergic reaction to pemetrexed, other medicines, foods, dyes, or preservative  pregnant or trying to get pregnant  breast-feeding How should I use this medicine? This drug is given as an infusion into a vein. It is administered in a hospital or clinic by a specially trained health care professional. Talk to your pediatrician regarding the use of this medicine in children. Special care may be needed. Overdosage: If you think you have taken too much of this medicine contact a poison control center or emergency room at once. NOTE: This medicine is only for you. Do not share this medicine with others. What if I miss a dose? It is important not to miss your dose. Call your doctor or health care professional if you are unable to keep an appointment. What may interact with this medicine? This medicine may interact with the following medications:  Ibuprofen This list may not describe all possible interactions. Give  your health care provider a list of all the medicines, herbs, non-prescription drugs, or dietary supplements you use. Also tell them if you smoke, drink alcohol, or use illegal drugs. Some items may interact with your medicine. What should I watch for while using this medicine? Visit your doctor for checks on your progress. This drug may make you feel generally unwell. This is not uncommon, as chemotherapy can affect healthy cells as well as cancer cells. Report any side effects. Continue your course of treatment even though you feel ill unless your doctor tells you to stop. In some cases, you may be given additional medicines to help with side effects. Follow all directions for their use. Call your doctor or health care professional for advice if you get a fever, chills or sore throat, or other symptoms of a cold or flu. Do not treat yourself. This drug decreases your body's ability to fight infections. Try to avoid being around people who are sick.  This medicine may increase your risk to bruise or bleed. Call your doctor or health care professional if you notice any unusual bleeding. Be careful brushing and flossing your teeth or using a toothpick because you may get an infection or bleed more easily. If you have any dental work done, tell your dentist you are receiving this medicine. Avoid taking products that contain aspirin, acetaminophen, ibuprofen, naproxen, or ketoprofen unless instructed by your doctor. These medicines may hide a fever. Call your doctor or health care professional if you get diarrhea or mouth sores. Do not treat yourself. To protect your kidneys, drink water or other fluids as directed while you are taking this medicine. Do not become pregnant while taking this medicine or for 6 months after stopping it. Women should inform their doctor if they wish to become pregnant or think they might be pregnant. Men should not father a child while taking this medicine and for 3 months after  stopping it. This may interfere with the ability to father a child. You should talk to your doctor or health care professional if you are concerned about your fertility. There is a potential for serious side effects to an unborn child. Talk to your health care professional or pharmacist for more information. Do not breast-feed an infant while taking this medicine or for 1 week after stopping it. What side effects may I notice from receiving this medicine? Side effects that you should report to your doctor or health care professional as soon as possible:  allergic reactions like skin rash, itching or hives, swelling of the face, lips, or tongue  breathing problems  redness, blistering, peeling or loosening of the skin, including inside the mouth  signs and symptoms of bleeding such as bloody or black, tarry stools; red or dark-brown urine; spitting up blood or brown material that looks like coffee grounds; red spots on the skin; unusual bruising or bleeding from the eye, gums, or nose  signs and symptoms of infection like fever or chills; cough; sore throat; pain or trouble passing urine  signs and symptoms of kidney injury like trouble passing urine or change in the amount of urine  signs and symptoms of liver injury like dark yellow or brown urine; general ill feeling or flu-like symptoms; light-colored stools; loss of appetite; nausea; right upper belly pain; unusually weak or tired; yellowing of the eyes or skin Side effects that usually do not require medical attention (report to your doctor or health care professional if they continue or are bothersome):  constipation  mouth sores  nausea, vomiting  unusually weak or tired This list may not describe all possible side effects. Call your doctor for medical advice about side effects. You may report side effects to FDA at 1-800-FDA-1088. Where should I keep my medicine? This drug is given in a hospital or clinic and will not be stored at  home. NOTE: This sheet is a summary. It may not cover all possible information. If you have questions about this medicine, talk to your doctor, pharmacist, or health care provider.  2021 Elsevier/Gold Standard (2017-10-19 16:11:33)  Palonosetron Injection What is this medicine? PALONOSETRON (pal oh NOE se tron) is used to prevent nausea and vomiting caused by chemotherapy. It also helps prevent delayed nausea and vomiting that may occur a few days after your treatment. This medicine may be used for other purposes; ask your health care provider or pharmacist if you have questions. COMMON BRAND NAME(S): Aloxi What should I tell my health care  provider before I take this medicine? They need to know if you have any of these conditions:  an unusual or allergic reaction to palonosetron, dolasetron, granisetron, ondansetron, other medicines, foods, dyes, or preservatives  pregnant or trying to get pregnant  breast-feeding How should I use this medicine? This medicine is for infusion into a vein. It is given by a health care professional in a hospital or clinic setting. Talk to your pediatrician regarding the use of this medicine in children. While this drug may be prescribed for children as young as 1 month for selected conditions, precautions do apply. Overdosage: If you think you have taken too much of this medicine contact a poison control center or emergency room at once. NOTE: This medicine is only for you. Do not share this medicine with others. What if I miss a dose? This does not apply. What may interact with this medicine?  certain medicines for depression, anxiety, or psychotic disturbances  fentanyl  linezolid  MAOIs like Carbex, Eldepryl, Marplan, Nardil, and Parnate  methylene blue (injected into a vein)  tramadol This list may not describe all possible interactions. Give your health care provider a list of all the medicines, herbs, non-prescription drugs, or dietary  supplements you use. Also tell them if you smoke, drink alcohol, or use illegal drugs. Some items may interact with your medicine. What should I watch for while using this medicine? Your condition will be monitored carefully while you are receiving this medicine. What side effects may I notice from receiving this medicine? Side effects that you should report to your doctor or health care professional as soon as possible:  allergic reactions like skin rash, itching or hives, swelling of the face, lips, or tongue  breathing problems  confusion  dizziness  fast, irregular heartbeat  fever and chills  loss of balance or coordination  seizures  sweating  swelling of the hands and feet  tremors  unusually weak or tired Side effects that usually do not require medical attention (report to your doctor or health care professional if they continue or are bothersome):  constipation or diarrhea  headache This list may not describe all possible side effects. Call your doctor for medical advice about side effects. You may report side effects to FDA at 1-800-FDA-1088. Where should I keep my medicine? This drug is given in a hospital or clinic and will not be stored at home. NOTE: This sheet is a summary. It may not cover all possible information. If you have questions about this medicine, talk to your doctor, pharmacist, or health care provider.  2021 Elsevier/Gold Standard (2013-07-06 10:38:36)  Zoledronic Acid Injection (Hypercalcemia, Oncology) What is this medicine? ZOLEDRONIC ACID (ZOE le dron ik AS id) slows calcium loss from bones. It high calcium levels in the blood from some kinds of cancer. It may be used in other people at risk for bone loss. This medicine may be used for other purposes; ask your health care provider or pharmacist if you have questions. COMMON BRAND NAME(S): Zometa What should I tell my health care provider before I take this medicine? They need to know if  you have any of these conditions:  cancer  dehydration  dental disease  kidney disease  liver disease  low levels of calcium in the blood  lung or breathing disease (asthma)  receiving steroids like dexamethasone or prednisone  an unusual or allergic reaction to zoledronic acid, other medicines, foods, dyes, or preservatives  pregnant or trying to get pregnant  breast-feeding How should I use this medicine? This drug is injected into a vein. It is given by a health care provider in a hospital or clinic setting. Talk to your health care provider about the use of this drug in children. Special care may be needed. Overdosage: If you think you have taken too much of this medicine contact a poison control center or emergency room at once. NOTE: This medicine is only for you. Do not share this medicine with others. What if I miss a dose? Keep appointments for follow-up doses. It is important not to miss your dose. Call your health care provider if you are unable to keep an appointment. What may interact with this medicine?  certain antibiotics given by injection  NSAIDs, medicines for pain and inflammation, like ibuprofen or naproxen  some diuretics like bumetanide, furosemide  teriparatide  thalidomide This list may not describe all possible interactions. Give your health care provider a list of all the medicines, herbs, non-prescription drugs, or dietary supplements you use. Also tell them if you smoke, drink alcohol, or use illegal drugs. Some items may interact with your medicine. What should I watch for while using this medicine? Visit your health care provider for regular checks on your progress. It may be some time before you see the benefit from this drug. Some people who take this drug have severe bone, joint, or muscle pain. This drug may also increase your risk for jaw problems or a broken thigh bone. Tell your health care provider right away if you have severe pain  in your jaw, bones, joints, or muscles. Tell you health care provider if you have any pain that does not go away or that gets worse. Tell your dentist and dental surgeon that you are taking this drug. You should not have major dental surgery while on this drug. See your dentist to have a dental exam and fix any dental problems before starting this drug. Take good care of your teeth while on this drug. Make sure you see your dentist for regular follow-up appointments. You should make sure you get enough calcium and vitamin D while you are taking this drug. Discuss the foods you eat and the vitamins you take with your health care provider. Check with your health care provider if you have severe diarrhea, nausea, and vomiting, or if you sweat a lot. The loss of too much body fluid may make it dangerous for you to take this drug. You may need blood work done while you are taking this drug. Do not become pregnant while taking this drug. Women should inform their health care provider if they wish to become pregnant or think they might be pregnant. There is potential for serious harm to an unborn child. Talk to your health care provider for more information. What side effects may I notice from receiving this medicine? Side effects that you should report to your doctor or health care provider as soon as possible:  allergic reactions (skin rash, itching or hives; swelling of the face, lips, or tongue)  bone pain  infection (fever, chills, cough, sore throat, pain or trouble passing urine)  jaw pain, especially after dental work  joint pain  kidney injury (trouble passing urine or change in the amount of urine)  low blood pressure (dizziness; feeling faint or lightheaded, falls; unusually weak or tired)  low calcium levels (fast heartbeat; muscle cramps or pain; pain, tingling, or numbness in the hands or feet; seizures)  low magnesium levels (fast,  irregular heartbeat; muscle cramp or pain; muscle  weakness; tremors; seizures)  low red blood cell counts (trouble breathing; feeling faint; lightheaded, falls; unusually weak or tired)  muscle pain  redness, blistering, peeling, or loosening of the skin, including inside the mouth  severe diarrhea  swelling of the ankles, feet, hands  trouble breathing Side effects that usually do not require medical attention (report to your doctor or health care provider if they continue or are bothersome):  anxious  constipation  coughing  depressed mood  eye irritation, itching, or pain  fever  general ill feeling or flu-like symptoms  nausea  pain, redness, or irritation at site where injected  trouble sleeping This list may not describe all possible side effects. Call your doctor for medical advice about side effects. You may report side effects to FDA at 1-800-FDA-1088. Where should I keep my medicine? This drug is given in a hospital or clinic. It will not be stored at home. NOTE: This sheet is a summary. It may not cover all possible information. If you have questions about this medicine, talk to your doctor, pharmacist, or health care provider.  2021 Elsevier/Gold Standard (2019-06-14 09:13:00)  Cyanocobalamin, Vitamin B12 injection What is this medicine? CYANOCOBALAMIN (sye an oh koe BAL a min) is a man made form of vitamin B12. Vitamin B12 is used in the growth of healthy blood cells, nerve cells, and proteins in the body. It also helps with the metabolism of fats and carbohydrates. This medicine is used to treat people who can not absorb vitamin B12. This medicine may be used for other purposes; ask your health care provider or pharmacist if you have questions. COMMON BRAND NAME(S): B-12 Compliance Kit, B-12 Injection Kit, Cyomin, LA-12, Nutri-Twelve, Physicians EZ Use B-12, Primabalt What should I tell my health care provider before I take this medicine? They need to know if you have any of these conditions:  kidney  disease  Leber's disease  megaloblastic anemia  an unusual or allergic reaction to cyanocobalamin, cobalt, other medicines, foods, dyes, or preservatives  pregnant or trying to get pregnant  breast-feeding How should I use this medicine? This medicine is injected into a muscle or deeply under the skin. It is usually given by a health care professional in a clinic or doctor's office. However, your doctor may teach you how to inject yourself. Follow all instructions. Talk to your pediatrician regarding the use of this medicine in children. Special care may be needed. Overdosage: If you think you have taken too much of this medicine contact a poison control center or emergency room at once. NOTE: This medicine is only for you. Do not share this medicine with others. What if I miss a dose? If you are given your dose at a clinic or doctor's office, call to reschedule your appointment. If you give your own injections and you miss a dose, take it as soon as you can. If it is almost time for your next dose, take only that dose. Do not take double or extra doses. What may interact with this medicine?  colchicine  heavy alcohol intake This list may not describe all possible interactions. Give your health care provider a list of all the medicines, herbs, non-prescription drugs, or dietary supplements you use. Also tell them if you smoke, drink alcohol, or use illegal drugs. Some items may interact with your medicine. What should I watch for while using this medicine? Visit your doctor or health care professional regularly. You may need blood  work done while you are taking this medicine. You may need to follow a special diet. Talk to your doctor. Limit your alcohol intake and avoid smoking to get the best benefit. What side effects may I notice from receiving this medicine? Side effects that you should report to your doctor or health care professional as soon as possible:  allergic reactions like  skin rash, itching or hives, swelling of the face, lips, or tongue  blue tint to skin  chest tightness, pain  difficulty breathing, wheezing  dizziness  red, swollen painful area on the leg Side effects that usually do not require medical attention (report to your doctor or health care professional if they continue or are bothersome):  diarrhea  headache This list may not describe all possible side effects. Call your doctor for medical advice about side effects. You may report side effects to FDA at 1-800-FDA-1088. Where should I keep my medicine? Keep out of the reach of children. Store at room temperature between 15 and 30 degrees C (59 and 85 degrees F). Protect from light. Throw away any unused medicine after the expiration date. NOTE: This sheet is a summary. It may not cover all possible information. If you have questions about this medicine, talk to your doctor, pharmacist, or health care provider.  2021 Elsevier/Gold Standard (2007-12-11 22:10:20)

## 2021-01-22 NOTE — Progress Notes (Signed)
Pt feeling great. Walking withtout cane most of the the time but when she is out in public she uses it.no concerns today. She was anxious to see that scan showed

## 2021-01-23 ENCOUNTER — Telehealth: Payer: Self-pay | Admitting: *Deleted

## 2021-01-23 NOTE — Telephone Encounter (Signed)
Pt has been made aware. Decadron removed from med list.

## 2021-01-23 NOTE — Telephone Encounter (Signed)
Pt is asking if needs to continue taking decadron 3 days after chemotherapy since she is only taking alimta/keytruda as maintenance.   Please advise.

## 2021-01-23 NOTE — Telephone Encounter (Signed)
Patient called asking if it is ok to get a spray tan. She said she has the ingredients of it if needed Please advise

## 2021-01-23 NOTE — Telephone Encounter (Signed)
No need for decadron

## 2021-01-23 NOTE — Telephone Encounter (Signed)
Call returned to patient and advised of doctor response. She thanked me for letting her know

## 2021-01-23 NOTE — Telephone Encounter (Signed)
Skin can be sensitive during chemo but the reagents dont react with chemo. If its important for her to get it she can

## 2021-02-05 ENCOUNTER — Telehealth: Payer: Self-pay | Admitting: Oncology

## 2021-02-05 NOTE — Telephone Encounter (Signed)
Patient would like a call back from you when possible.  Thanks so much

## 2021-02-11 ENCOUNTER — Other Ambulatory Visit: Payer: Self-pay | Admitting: *Deleted

## 2021-02-11 DIAGNOSIS — C3491 Malignant neoplasm of unspecified part of right bronchus or lung: Secondary | ICD-10-CM

## 2021-02-11 DIAGNOSIS — C7951 Secondary malignant neoplasm of bone: Secondary | ICD-10-CM

## 2021-02-12 ENCOUNTER — Inpatient Hospital Stay (HOSPITAL_BASED_OUTPATIENT_CLINIC_OR_DEPARTMENT_OTHER): Payer: 59 | Admitting: Nurse Practitioner

## 2021-02-12 ENCOUNTER — Encounter: Payer: Self-pay | Admitting: Nurse Practitioner

## 2021-02-12 ENCOUNTER — Inpatient Hospital Stay: Payer: 59 | Attending: Oncology

## 2021-02-12 VITALS — BP 101/76 | HR 90 | Temp 97.9°F | Resp 16 | Ht 67.0 in | Wt 201.0 lb

## 2021-02-12 DIAGNOSIS — I10 Essential (primary) hypertension: Secondary | ICD-10-CM | POA: Insufficient documentation

## 2021-02-12 DIAGNOSIS — Z5112 Encounter for antineoplastic immunotherapy: Secondary | ICD-10-CM | POA: Diagnosis not present

## 2021-02-12 DIAGNOSIS — F418 Other specified anxiety disorders: Secondary | ICD-10-CM | POA: Insufficient documentation

## 2021-02-12 DIAGNOSIS — Z87891 Personal history of nicotine dependence: Secondary | ICD-10-CM | POA: Diagnosis not present

## 2021-02-12 DIAGNOSIS — C3492 Malignant neoplasm of unspecified part of left bronchus or lung: Secondary | ICD-10-CM | POA: Insufficient documentation

## 2021-02-12 DIAGNOSIS — E785 Hyperlipidemia, unspecified: Secondary | ICD-10-CM | POA: Insufficient documentation

## 2021-02-12 DIAGNOSIS — Z5111 Encounter for antineoplastic chemotherapy: Secondary | ICD-10-CM | POA: Insufficient documentation

## 2021-02-12 DIAGNOSIS — C7972 Secondary malignant neoplasm of left adrenal gland: Secondary | ICD-10-CM | POA: Insufficient documentation

## 2021-02-12 DIAGNOSIS — G893 Neoplasm related pain (acute) (chronic): Secondary | ICD-10-CM | POA: Insufficient documentation

## 2021-02-12 DIAGNOSIS — C7951 Secondary malignant neoplasm of bone: Secondary | ICD-10-CM | POA: Insufficient documentation

## 2021-02-12 DIAGNOSIS — R5383 Other fatigue: Secondary | ICD-10-CM | POA: Insufficient documentation

## 2021-02-12 DIAGNOSIS — C3491 Malignant neoplasm of unspecified part of right bronchus or lung: Secondary | ICD-10-CM

## 2021-02-12 DIAGNOSIS — Z79899 Other long term (current) drug therapy: Secondary | ICD-10-CM | POA: Diagnosis not present

## 2021-02-12 DIAGNOSIS — F411 Generalized anxiety disorder: Secondary | ICD-10-CM

## 2021-02-12 DIAGNOSIS — C778 Secondary and unspecified malignant neoplasm of lymph nodes of multiple regions: Secondary | ICD-10-CM | POA: Diagnosis not present

## 2021-02-12 DIAGNOSIS — C801 Malignant (primary) neoplasm, unspecified: Secondary | ICD-10-CM

## 2021-02-12 LAB — CBC WITH DIFFERENTIAL/PLATELET
Abs Immature Granulocytes: 0.04 10*3/uL (ref 0.00–0.07)
Basophils Absolute: 0 10*3/uL (ref 0.0–0.1)
Basophils Relative: 0 %
Eosinophils Absolute: 0 10*3/uL (ref 0.0–0.5)
Eosinophils Relative: 0 %
HCT: 34.9 % — ABNORMAL LOW (ref 36.0–46.0)
Hemoglobin: 11.8 g/dL — ABNORMAL LOW (ref 12.0–15.0)
Immature Granulocytes: 0 %
Lymphocytes Relative: 13 %
Lymphs Abs: 1.5 10*3/uL (ref 0.7–4.0)
MCH: 30.7 pg (ref 26.0–34.0)
MCHC: 33.8 g/dL (ref 30.0–36.0)
MCV: 90.9 fL (ref 80.0–100.0)
Monocytes Absolute: 0.9 10*3/uL (ref 0.1–1.0)
Monocytes Relative: 8 %
Neutro Abs: 9.2 10*3/uL — ABNORMAL HIGH (ref 1.7–7.7)
Neutrophils Relative %: 79 %
Platelets: 317 10*3/uL (ref 150–400)
RBC: 3.84 MIL/uL — ABNORMAL LOW (ref 3.87–5.11)
RDW: 16.4 % — ABNORMAL HIGH (ref 11.5–15.5)
WBC: 11.6 10*3/uL — ABNORMAL HIGH (ref 4.0–10.5)
nRBC: 0 % (ref 0.0–0.2)

## 2021-02-12 LAB — COMPREHENSIVE METABOLIC PANEL
ALT: 22 U/L (ref 0–44)
AST: 23 U/L (ref 15–41)
Albumin: 3.9 g/dL (ref 3.5–5.0)
Alkaline Phosphatase: 106 U/L (ref 38–126)
Anion gap: 13 (ref 5–15)
BUN: 16 mg/dL (ref 6–20)
CO2: 19 mmol/L — ABNORMAL LOW (ref 22–32)
Calcium: 9.4 mg/dL (ref 8.9–10.3)
Chloride: 107 mmol/L (ref 98–111)
Creatinine, Ser: 0.64 mg/dL (ref 0.44–1.00)
GFR, Estimated: >60 mL/min (ref >60)
Glucose, Bld: 164 mg/dL — ABNORMAL HIGH (ref 70–99)
Potassium: 3.8 mmol/L (ref 3.5–5.1)
Sodium: 139 mmol/L (ref 135–145)
Total Bilirubin: 0.5 mg/dL (ref 0.3–1.2)
Total Protein: 7.1 g/dL (ref 6.5–8.1)

## 2021-02-12 LAB — TSH: TSH: 0.285 u[IU]/mL — ABNORMAL LOW (ref 0.350–4.500)

## 2021-02-12 NOTE — Progress Notes (Signed)
Hematology/Oncology Consult note 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 She underwent operative fixation on 10/12/2020. MRI of femur showed heterogeneously enhancing osseous lesion within the area which would be nonspecific versus office neoplasm or metastatic lesion. CT chest abdomen and pelvis with contrast showed an enlarged pretracheal lymph node 2.2 x 1.5 cm and a right paratracheal lymph node measuring 2.7 x 1.3 cm. Prevascular node measuring 0.3 x 0.9 cm. 5 x 4 mm right middle lobe nodule. 2.8 x 1.9 cm left adrenal lesion.  Reamings from the right femur showed metastatic poorly differentiated carcinoma. Immunohistochemistry showed was positive for pancytokeratin, CK7 and patchy CK20 with patchy dim expression of TTF-1. Cells negative for Melan-A, CDX2, PAX8, Napsin A, GATA3, p40, CD56, p16 and thyroglobulin. Findings compatible with metastatic carcinoma but because of decalcification immunohistochemical staining is unreliable. Patchy dim staining with TTF-1 suspicious for lung primary but not a definitive diagnosis.  Repeat supraclavicular lymph node biopsy showed metastatic  adenocarcinoma. Tumor cells positive for CK7 with focal weak staining for TTF-1. Suggestive of lung origin in the proper clinical context. Cells were negative for GATA3 PAX8 CDX2 and CK20 and NapsinA.Foundation 1 liquid biopsyshowedShowed K-ras G12 C, PIK3 CA, KDA P1C171F, KDM 5CE 296   Interval history-patient returns to clinic for further evaluation and consideration of maintenance Alimta and Keytruda.  She reports fatigue but that does not improve with rest.  Also complains of mood swings, depression, and anxiety.  Has prescription for Xanax but it makes her too sleepy.  Previously taking sertraline.  Otherwise feels well and is tolerating treatment without significant side effects.  ECOG PS- 1 Pain scale- 0 Opioid associated constipation- no  Review of systems- Review of Systems  Constitutional: Positive for malaise/fatigue. Negative for chills, fever and weight loss.  HENT: Negative for congestion, ear discharge and nosebleeds.   Eyes: Negative for blurred vision.  Respiratory: Negative for cough, hemoptysis, sputum production, shortness of breath and wheezing.   Cardiovascular: Negative for chest pain, palpitations, orthopnea and claudication.  Gastrointestinal: Negative for abdominal pain, blood in stool, constipation, diarrhea, heartburn, melena, nausea and vomiting.  Genitourinary: Negative for dysuria, flank pain, frequency, hematuria and urgency.  Musculoskeletal: Negative for back pain, joint pain and myalgias.  Skin: Negative for rash.  Neurological: Negative for dizziness, tingling, focal weakness, seizures, weakness and headaches.  Endo/Heme/Allergies: Does not bruise/bleed easily.  Psychiatric/Behavioral: Positive for depression. Negative for suicidal ideas. The patient is nervous/anxious. The patient does not have insomnia.       Allergies  Allergen Reactions  . Factive [Gemifloxacin] Rash     Past Medical History:  Diagnosis Date  . Abnormal Pap smear of  cervix   . Anxiety   . Depression   . Essential hypertension   . Femur fracture, right ([place])   .  Lung cancer ([place])   . Palpitations   . Precordial chest pain      Past Surgical History:  Procedure Laterality Date  . BREAST BIOPSY Right 2017   benign  . CERVICAL BIOPSY  W/ LOOP ELECTRODE EXCISION    . COLONOSCOPY WITH PROPOFOL N/A 07/03/2015   Procedure: COLONOSCOPY WITH PROPOFOL;  Surgeon: Hulen Luster, MD;  Location: [facility] ENDOSCOPY;  Service: Gastroenterology;  Laterality: N/A;  . ESOPHAGOGASTRODUODENOSCOPY (EGD) WITH PROPOFOL N/A 07/03/2015   Procedure: ESOPHAGOGASTRODUODENOSCOPY (EGD) WITH PROPOFOL;  Surgeon: Hulen Luster, MD;  Location: [facility] ENDOSCOPY;  Service: Gastroenterology;  Laterality: N/A;  . INTRAMEDULLARY (IM) NAIL INTERTROCHANTERIC Right 09/15/2020   Procedure: INTRAMEDULLARY (IM) NAIL INTERTROCHANTRIC;  Surgeon: Corky Mull, MD;  Location: [facility] ORS;  Service: Orthopedics;  Laterality: Right;  . IR IMAGING GUIDED PORT INSERTION  11/28/2020  . LEFT HEART CATH AND CORONARY ANGIOGRAPHY N/A 03/28/2017   Procedure: Left Heart Cath and Coronary Angiography;  Surgeon: Lorretta Harp, MD;  Location: [facility] CV LAB;  Service: Cardiovascular;  Laterality: N/A;    Social History   Socioeconomic History  . Marital status: Married    Spouse name: Not on file  . Number of children: Not on file  . Years of education: Not on file  . Highest education level: Not on file  Occupational History  . Not on file  Tobacco Use  . Smoking status: Former Smoker    Packs/day: 1.00    Years: 30.00    Pack years: 30.00    Quit date: 09/17/2020    Years since quitting: 0.4  . Smokeless tobacco: Never Used  Vaping Use  . Vaping Use: Never used  Substance and Sexual Activity  . Alcohol use: Not Currently    Alcohol/week: 0.0 standard drinks  . Drug use: No  . Sexual activity: Yes    Birth control/protection: I.U.D., Post-menopausal  Other Topics Concern  . Not on file  Social  History Narrative   Lives in [place] with her husband.  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   Social Determinants of Health   Financial Resource Strain: Not on file  Food Insecurity: Not on file  Transportation Needs: Not on file  Physical Activity: Not on file  Stress: Not on file  Social Connections: Not on file  Intimate Partner Violence: Not on file    Family History  Problem Relation Age of Onset  . Diabetes Mother        alive @ 75  . Goiter Mother   . Heart disease Father        died of MI @ 69  . Osteoporosis Maternal Grandmother   . Colon cancer Maternal Grandfather   . Breast cancer Neg Hx   . Ovarian cancer Neg Hx      Current Outpatient Medications:  .  acetaminophen (TYLENOL) 325 MG tablet, Take 1-2 tablets (325-650 mg total) by mouth every 6 (six) hours as needed for mild pain (pain score 1-3 or temp > 100.5)., Disp: , Rfl:  .  ALPRAZolam (XANAX) 0.5 MG tablet, Take 1 tablet (0.5 mg total) by mouth every 8 (eight) hours as needed for anxiety., Disp: 30 tablet, Rfl: 0 .  folic acid (FOLVITE) 1 MG tablet, Take 1 tablet (1 mg total) by mouth daily., Disp: 90 tablet, Rfl: 1 .  lidocaine-prilocaine (EMLA) cream, Apply to affected area once, Disp: 30 g, Rfl: 3 .  metoprolol succinate (TOPROL-XL) 25 MG  24 hr tablet, Take 25 mg by mouth daily., Disp: , Rfl:  .  omeprazole (PRILOSEC) 20 MG capsule, Take 1 capsule by mouth every morning., Disp: , Rfl:  .  ondansetron (ZOFRAN) 4 MG tablet, Take 1 tablet (4 mg total) by mouth every 8 (eight) hours as needed for nausea or vomiting., Disp: 30 tablet, Rfl: 1 .  oxyCODONE (OXY IR/ROXICODONE) 5 MG immediate release tablet, Take 1-2 tablets (5-10 mg total) by mouth every 4 (four) hours as needed for moderate pain., Disp: 60 tablet, Rfl: 0 .  polyethylene glycol (MIRALAX) 17 g packet, Take 17 g by mouth daily as needed for moderate constipation., Disp: 30 each, Rfl: 0 .   prochlorperazine (COMPAZINE) 10 MG tablet, Take 1 tablet (10 mg total) by mouth every 6 (six) hours as needed (Nausea or vomiting)., Disp: 30 tablet, Rfl: 1 No current facility-administered medications for this visit.  Facility-Administered Medications Ordered in Other Visits:  .  Zoledronic Acid (ZOMETA) IVPB 4 mg, 4 mg, Intravenous, Q28 days, Sindy Guadeloupe, MD, Stopped at 11/21/20 1100  Physical exam:  There were no vitals filed for this visit. Physical Exam Constitutional:      Appearance: She is well-developed. She is not ill-appearing.  HENT:     Head: Normocephalic and atraumatic.     Nose: Nose normal.     Mouth/Throat:     Pharynx: No oropharyngeal exudate.  Eyes:     General: No scleral icterus.    Conjunctiva/sclera: Conjunctivae normal.  Cardiovascular:     Rate and Rhythm: Normal rate and regular rhythm.  Pulmonary:     Effort: Pulmonary effort is normal.     Breath sounds: Normal breath sounds.  Abdominal:     General: Bowel sounds are normal. There is no distension.     Palpations: Abdomen is soft.     Tenderness: There is no abdominal tenderness.  Musculoskeletal:        General: Normal range of motion.     Cervical back: Normal range of motion and neck supple.  Skin:    General: Skin is warm and dry.  Neurological:     Mental Status: She is alert and oriented to person, place, and time.  Psychiatric:        Mood and Affect: Mood normal.        Behavior: Behavior normal.      CMP Latest Ref Rng & Units 02/12/2021  Glucose 70 - 99 mg/dL 164(H)  BUN 6 - 20 mg/dL 16  Creatinine 0.44 - 1.00 mg/dL 0.64  Sodium 135 - 145 mmol/L 139  Potassium 3.5 - 5.1 mmol/L 3.8  Chloride 98 - 111 mmol/L 107  CO2 22 - 32 mmol/L 19(L)  Calcium 8.9 - 10.3 mg/dL 9.4  Total Protein 6.5 - 8.1 g/dL 7.1  Total Bilirubin 0.3 - 1.2 mg/dL 0.5  Alkaline Phos 38 - 126 U/L 106  AST 15 - 41 U/L 23  ALT 0 - 44 U/L 22   CBC Latest Ref Rng & Units 02/12/2021  WBC 4.0 - 10.5 K/uL 11.6(H)   Hemoglobin 12.0 - 15.0 g/dL 11.8(L)  Hematocrit 36.0 - 46.0 % 34.9(L)  Platelets 150 - 400 K/uL 317    No images are attached to the encounter.  MR Brain W Wo Contrast  Result Date: 01/21/2021 CLINICAL DATA:  55 year old female with metastatic lung cancer. Two possible punctate/early brain metastases on January MRI. Known skull/skeletal metastases. EXAM: MRI HEAD WITHOUT AND WITH CONTRAST TECHNIQUE: Multiplanar, multiecho pulse sequences  of the brain and surrounding structures were obtained without and with intravenous contrast. CONTRAST:  58m GADAVIST GADOBUTROL 1 MMOL/ML IV SOLN COMPARISON:  Nuclear Medicine whole-body bone scan 01/16/2021. Brain MRI 10/01/2020. FINDINGS: Brain: The 2 punctate foci of right frontal lobe enhancement seen on prior coronal and sagittal postcontrast images do not persist. No edema or signal abnormality in those areas. Small developmental venous anomaly redemonstrated in the anterior right frontal lobe (series 19, image 24), normal variant. No abnormal enhancement of the brain today. No dural thickening. No restricted diffusion to suggest acute infarction. No midline shift, mass effect, evidence of mass lesion, ventriculomegaly, extra-axial collection or acute intracranial hemorrhage. Cervicomedullary junction and pituitary are within normal limits. Normal cerebral volume. GPearline Cablesand white matter signal is within normal limits for age throughout the brain. No encephalomalacia or chronic cerebral blood products. Vascular: Major intracranial vascular flow voids are stable. The major dural venous sinuses are enhancing and appear patent. Skull and upper cervical spine: Heterogeneously enhancing and FLAIR hyperintense right posterior skull lesion, approximately 41 mm (series 15, image 35 and series 18, image 114) corresponding to the recent nuclear medicine bone scan abnormality. No underlying dural thickening identified. No other bone metastasis identified in the head. Negative  visible spinal cord. Sinuses/Orbits: Stable and negative. Other: Mastoids remain clear. Visible internal auditory structures appear normal. Negative visible scalp and face. IMPRESSION: 1. The two punctate foci of right frontal lobe enhancement in January have resolved. These might be tiny treated metastases, or might have been artifact. No metastatic disease to the brain today. 2. However, there is a solitary right posterior skull metastasis measuring 41 mm corresponding to the recent nuclear medicine bone scan abnormality. No evidence of dural involvement at this time. Electronically Signed   By: HGenevie AnnM.D.   On: 01/21/2021 06:35   NM Bone Scan Whole Body  Result Date: 01/19/2021 CLINICAL DATA:  Metastatic lung carcinoma EXAM: NUCLEAR MEDICINE WHOLE BODY BONE SCAN TECHNIQUE: Whole body anterior and posterior images were obtained approximately 3 hours after intravenous injection of radiopharmaceutical. RADIOPHARMACEUTICALS:  23.07 mCi Technetium-946mDP IV COMPARISON:  CT chest, abdomen, and pelvis Jan 16, 2021 FINDINGS: There is intense increased uptake in the lateral left iliac crest at the site of metastatic focus on CT. There is extensive abnormal uptake throughout the right femur. Note that there is a rod within the right femur with increased uptake surrounding the rod and distal to the rod. There is an area of increased uptake in the right skull at the parieto-occipital junction. Increased uptake in knees, ankles, and shoulders is likely of arthropathic etiology. Increased uptake in the paranasal sinus regions is likely due to paranasal sinus disease. The distribution of radiotracer uptake elsewhere is otherwise unremarkable. Renal uptake noted in the flank positions bilaterally, a physiologic finding. IMPRESSION: Evidence consistent with metastatic disease in the right skull near the parieto-occipital junction, left lateral iliac crest common and along the right femur. Note that there is a rod within the  right femur with photopenia in the area of the rod. Electronically Signed   By: WiLowella GripII M.D.   On: 01/19/2021 14:13   CT CHEST ABDOMEN PELVIS W CONTRAST  Result Date: 01/19/2021 CLINICAL DATA:  Lung cancer on chemotherapy. EXAM: CT CHEST, ABDOMEN, AND PELVIS WITH CONTRAST TECHNIQUE: Multidetector CT imaging of the chest, abdomen and pelvis was performed following the standard protocol during bolus administration of intravenous contrast. CONTRAST:  10020mMNIPAQUE IOHEXOL 300 MG/ML  SOLN COMPARISON:  PET  10/02/2020 and CT chest abdomen pelvis 09/15/2020. FINDINGS: CT CHEST FINDINGS Cardiovascular: Right IJ Port-A-Cath terminates in the right atrium. Heart size normal. No pericardial effusion. Mediastinum/Nodes: Low-attenuation lesions in right thyroid measure up to 12 mm. No follow-up recommended. (Ref: J Am Coll Radiol. 2015 Feb;12(2): 143-50).High right paratracheal lymph node has decreased in size, measuring 10 mm, compared to 13 mm on 09/15/2020. Similarly, low paratracheal and AP window lymph nodes have decreased in size. Index low right paratracheal lymph node measures 9 mm, compared to 1.5 cm on 09/15/2020. Hilar lymph nodes are not enlarged by CT size criteria. No axillary adenopathy. Esophagus is grossly unremarkable. Lungs/Pleura: 4 mm lateral segment right middle lobe nodule (4/90), unchanged. Minimal dependent atelectasis bilaterally. Lungs are otherwise clear. No pleural fluid. Airway is unremarkable. Musculoskeletal: Degenerative changes in the spine. Old left rib fracture. No worrisome lytic or sclerotic lesions. CT ABDOMEN PELVIS FINDINGS Hepatobiliary: Liver is decreased in attenuation diffusely. 12 mm low-attenuation lesion in the dome of the liver is unchanged but too small to characterize. Liver and gallbladder are otherwise unremarkable. No biliary ductal dilatation. Pancreas: Negative. Spleen: Negative. Adrenals/Urinary Tract: Right adrenal gland is unremarkable. Left adrenal  metastasis has decreased in size, measuring 1.3 x 2.5 cm, compared to 1.9 x 2.8 cm on 09/15/2020. Small stone in the right kidney. Subcentimeter low-attenuation lesion in the left kidney is too small to characterize but statistically, a cyst is likely. Ureters are decompressed. Bladder is low in volume. Stomach/Bowel: Stomach, small bowel and appendix are unremarkable. Fair amount of stool is seen in the colon. There is wall thickening and pericolonic inflammatory stranding/fluid involving the distal descending colon, new from 09/15/2020. Vascular/Lymphatic: Atherosclerotic calcification of the aorta. Periportal, retroperitoneal and small bowel mesenteric lymph nodes are not enlarged by CT size criteria. Reproductive: Uterus is visualized.  No adnexal mass. Other: No free fluid.  Mesenteries and peritoneum are unremarkable. Musculoskeletal: Slight lucency in the anterior left iliac wing with surrounding periosteal reaction. IM rod and dynamic hip screw fixation of an expansile lytic lesion in the right femoral shaft, incompletely visualized. Lucency within the left sacrum appears similar and was not shown to be hypermetabolic on 93/90/3009. Degenerative changes in the spine. Minimal grade 1 anterolisthesis of L4 on L5. S1 is transitional. IMPRESSION: 1. Interval decrease in size of mediastinal adenopathy and left adrenal metastasis. Right adrenal gland is normal in appearance. 2. Stable osseous metastatic disease involving the left iliac wing and right femur. 3. Right middle lobe nodule is stable. 4. Thickening of the wall of the descending colon with surrounding inflammatory stranding and fluid, indicative of diverticulitis. 5. Hepatic steatosis. 6. Right renal stone. 7.  Aortic atherosclerosis (ICD10-I70.0). Electronically Signed   By: Lorin Picket M.D.   On: 01/19/2021 08:25     Assessment and plan- Patient is a 55 y.o. 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  She is status post 4 cycles of carbo Alimta Keytruda chemotherapy and returns to clinic for treatment assessment prior to cycle 2 of maintenance Alimta and Keytruda.  Previously, MRI brain images showed 2 punctate foci of right frontal lobe enhancement in January.  Resolved.  Artifact was not ruled out.  Dr. Janese Banks held off on offering whole brain radiation with the plan to repeat brain MRI in August 2022.  CT chest abdomen pelvis showed overall decrease in size of paratracheal and AP window adenopathy.  4 mm right middle lobe nodule unchanged.  Decrease in size  of left adrenal metastasis from 1.9 x 2.8 cm to 1.3 x 2.5 cm.  Bone scan showed stable osseous metastatic disease in May 2022.  Plan to continue maintenance Alimta and Keytruda progression of disease or toxicity.  Labs reviewed today and acceptable for continuation of treatment.  Proceed with cycle 2 of maintenance Alimta and Keytruda.  She received Zometa on 01/22/2021 and will receive monthly.  She received a B12 on 01/22/2021 and will receive monthly.  Neoplasm related pain-continue oxycodone with bowel prophylaxis as prescribed.  Mood swings-etiology unclear.  Worse since cancer diagnosis.  Will restart sertraline 50 mg.  Can uptitrate to effect.  Fatigue- etiology unclear. Will check TSH, free t4, cortisol, and acth to evaluate for possible immunotherapy related etiology.   Plan:  RTC for treatment tomorrow (6/3) Follow up with Dr. Janese Banks as scheduled for labs and consideration of maintenance Alimta, Keytruda, B12, and Zometa on 03/09/21.      Visit Diagnosis 1. Encounter for antineoplastic immunotherapy   2. Fatigue, unspecified type   3. Anxiety associated with cancer diagnosis ([place])   4. Primary malignant neoplasm of right lung metastatic to other site ([place])     Beckey Rutter, [place], AGNP-C [place] at [facility] [phone] (clinic)

## 2021-02-13 ENCOUNTER — Other Ambulatory Visit: Payer: Self-pay | Admitting: *Deleted

## 2021-02-13 ENCOUNTER — Telehealth: Payer: Self-pay | Admitting: Oncology

## 2021-02-13 ENCOUNTER — Other Ambulatory Visit: Payer: Self-pay | Admitting: Nurse Practitioner

## 2021-02-13 ENCOUNTER — Inpatient Hospital Stay: Payer: 59

## 2021-02-13 VITALS — BP 126/83 | HR 69 | Temp 96.9°F | Resp 16

## 2021-02-13 DIAGNOSIS — C3491 Malignant neoplasm of unspecified part of right bronchus or lung: Secondary | ICD-10-CM

## 2021-02-13 DIAGNOSIS — Z5111 Encounter for antineoplastic chemotherapy: Secondary | ICD-10-CM | POA: Diagnosis not present

## 2021-02-13 DIAGNOSIS — R7989 Other specified abnormal findings of blood chemistry: Secondary | ICD-10-CM

## 2021-02-13 LAB — CORTISOL: Cortisol, Plasma: <0.4 ug/dL

## 2021-02-13 LAB — T4, FREE: Free T4: 0.92 ng/dL (ref 0.61–1.12)

## 2021-02-13 MED ORDER — PALONOSETRON HCL INJECTION 0.25 MG/5ML
0.2500 mg | Freq: Once | INTRAVENOUS | Status: AC
  Administered 2021-02-13: 0.25 mg via INTRAVENOUS
  Filled 2021-02-13: qty 5

## 2021-02-13 MED ORDER — SODIUM CHLORIDE 0.9 % IV SOLN
200.0000 mg | Freq: Once | INTRAVENOUS | Status: AC
  Administered 2021-02-13: 200 mg via INTRAVENOUS
  Filled 2021-02-13: qty 8

## 2021-02-13 MED ORDER — SODIUM CHLORIDE 0.9 % IV SOLN
Freq: Once | INTRAVENOUS | Status: AC
  Filled 2021-02-13: qty 250

## 2021-02-13 MED ORDER — SODIUM CHLORIDE 0.9 % IV SOLN
500.0000 mg/m2 | Freq: Once | INTRAVENOUS | Status: AC
  Administered 2021-02-13: 1000 mg via INTRAVENOUS
  Filled 2021-02-13: qty 40

## 2021-02-13 MED ORDER — SERTRALINE HCL 50 MG PO TABS: 50.0000 mg | ORAL_TABLET | Freq: Every day | ORAL | 1 refills | Status: DC

## 2021-02-13 MED ORDER — HEPARIN SOD (PORK) LOCK FLUSH 100 UNIT/ML IV SOLN
INTRAVENOUS | Status: AC
  Filled 2021-02-13: qty 5

## 2021-02-13 MED ORDER — HEPARIN SOD (PORK) LOCK FLUSH 100 UNIT/ML IV SOLN
500.0000 [IU] | Freq: Once | INTRAVENOUS | Status: AC | PRN
  Administered 2021-02-13: 500 [IU]
  Filled 2021-02-13: qty 5

## 2021-02-13 NOTE — Telephone Encounter (Signed)
Left VM with patient updating her on changes to future appointments. Will send myChart message as well.

## 2021-02-13 NOTE — Progress Notes (Signed)
No B12 needed at this time per Elmon Else [facility] and Teressa Lower per Dr. Janese Banks.  Additional labs drawn per Beckey Rutter NP.  Pt stable at discharge.

## 2021-02-13 NOTE — Patient Instructions (Signed)
[facility]COL[facility]  Discharge Instructions: Thank you for choosing [facility] to provide your oncology and hematology care.  If you have a lab appointment with the [facility], please go directly to the [facility] and check in at the registration area.  Wear comfortable clothing and clothing appropriate for easy access to any Portacath or PICC line.   We strive to give you quality time with your provider. You may need to reschedule your appointment if you arrive late (15 or more minutes).  Arriving late affects you and other patients whose appointments are after yours.  Also, if you miss three or more appointments without notifying the office, you may be dismissed from the clinic at the provider's discretion.      For prescription refill requests, have your pharmacy contact our office and allow 72 hours for refills to be completed.    Today you received the following chemotherapy and/or immunotherapy agents Keytruda and  Alimta.       To help prevent nausea and vomiting after your treatment, we encourage you to take your nausea medication as directed.  BELOW ARE SYMPTOMS THAT SHOULD BE REPORTED IMMEDIATELY: . *FEVER GREATER THAN 100.4 F (38 C) OR HIGHER . *CHILLS OR SWEATING . *NAUSEA AND VOMITING THAT IS NOT CONTROLLED WITH YOUR NAUSEA MEDICATION . *UNUSUAL SHORTNESS OF BREATH . *UNUSUAL BRUISING OR BLEEDING . *URINARY PROBLEMS (pain or burning when urinating, or frequent urination) . *BOWEL PROBLEMS (unusual diarrhea, constipation, pain near the anus) . TENDERNESS IN MOUTH AND THROAT WITH OR WITHOUT PRESENCE OF ULCERS (sore throat, sores in mouth, or a toothache) . UNUSUAL RASH, SWELLING OR PAIN  . UNUSUAL VAGINAL DISCHARGE OR ITCHING   Items with * indicate a potential emergency and should be followed up as soon as possible or go to the Emergency Department if any problems should occur.  Please show the CHEMOTHERAPY ALERT CARD or  IMMUNOTHERAPY ALERT CARD at check-in to the Emergency Department and triage nurse.  Should you have questions after your visit or need to cancel or reschedule your appointment, please contact [facility]  [phone] and follow the prompts.  Office hours are 8:00 a.m. to 4:30 p.m. Monday - Friday. Please note that voicemails left after 4:00 p.m. may not be returned until the following business day.  We are closed weekends and major holidays. You have access to a nurse at all times for urgent questions. Please call the main number to the clinic [phone] and follow the prompts.  For any non-urgent questions, you may also contact your provider using MyChart. We now offer e-Visits for anyone 9 and older to request care online for non-urgent symptoms. For details visit [email or web address].   Also download the MyChart app! Go to the app store, search "MyChart", open the app, select [facility], and log in with your MyChart username and password.  Due to Covid, a mask is required upon entering the hospital/clinic. If you do not have a mask, one will be given to you upon arrival. For doctor visits, patients may have 1 support person aged 62 or older with them. For treatment visits, patients cannot have anyone with them due to current Covid guidelines and our immunocompromised population.

## 2021-02-16 ENCOUNTER — Inpatient Hospital Stay: Payer: 59

## 2021-02-16 ENCOUNTER — Encounter: Payer: Self-pay | Admitting: Oncology

## 2021-02-18 ENCOUNTER — Telehealth: Payer: Self-pay | Admitting: *Deleted

## 2021-02-18 NOTE — Telephone Encounter (Signed)
Pt was going for a massage and and wanted to know if she should stay away from certain essential oils. I called her and got voicemail and told her that any oils on body is ok-do not put oils on the port site. If there are diffusers -do not use them.told her that she could call me if she had questions.   I called her 6/7 and left this message and did not document today

## 2021-02-23 ENCOUNTER — Other Ambulatory Visit: Payer: Self-pay | Admitting: Oncology

## 2021-03-02 ENCOUNTER — Other Ambulatory Visit: Payer: 59

## 2021-03-02 ENCOUNTER — Ambulatory Visit: Payer: 59

## 2021-03-02 ENCOUNTER — Ambulatory Visit: Payer: 59 | Admitting: Oncology

## 2021-03-06 ENCOUNTER — Ambulatory Visit: Payer: 59 | Admitting: Oncology

## 2021-03-06 ENCOUNTER — Ambulatory Visit: Payer: 59

## 2021-03-06 ENCOUNTER — Other Ambulatory Visit: Payer: 59

## 2021-03-09 ENCOUNTER — Inpatient Hospital Stay: Payer: 59

## 2021-03-09 ENCOUNTER — Inpatient Hospital Stay (HOSPITAL_BASED_OUTPATIENT_CLINIC_OR_DEPARTMENT_OTHER): Payer: 59 | Admitting: Oncology

## 2021-03-09 ENCOUNTER — Encounter: Payer: Self-pay | Admitting: Oncology

## 2021-03-09 ENCOUNTER — Other Ambulatory Visit: Payer: Self-pay

## 2021-03-09 VITALS — BP 121/80 | HR 73 | Temp 96.9°F | Resp 18 | Wt 206.3 lb

## 2021-03-09 VITALS — BP 120/77 | HR 81 | Temp 96.8°F | Resp 17

## 2021-03-09 DIAGNOSIS — G893 Neoplasm related pain (acute) (chronic): Secondary | ICD-10-CM | POA: Diagnosis not present

## 2021-03-09 DIAGNOSIS — C3491 Malignant neoplasm of unspecified part of right bronchus or lung: Secondary | ICD-10-CM

## 2021-03-09 DIAGNOSIS — Z5112 Encounter for antineoplastic immunotherapy: Secondary | ICD-10-CM

## 2021-03-09 DIAGNOSIS — Z5111 Encounter for antineoplastic chemotherapy: Secondary | ICD-10-CM

## 2021-03-09 DIAGNOSIS — R7989 Other specified abnormal findings of blood chemistry: Secondary | ICD-10-CM

## 2021-03-09 LAB — COMPREHENSIVE METABOLIC PANEL
ALT: 31 U/L (ref 0–44)
AST: 23 U/L (ref 15–41)
Albumin: 3.9 g/dL (ref 3.5–5.0)
Alkaline Phosphatase: 92 U/L (ref 38–126)
Anion gap: 8 (ref 5–15)
BUN: 15 mg/dL (ref 6–20)
CO2: 22 mmol/L (ref 22–32)
Calcium: 9.1 mg/dL (ref 8.9–10.3)
Chloride: 106 mmol/L (ref 98–111)
Creatinine, Ser: 0.59 mg/dL (ref 0.44–1.00)
GFR, Estimated: >60 mL/min (ref >60)
Glucose, Bld: 168 mg/dL — ABNORMAL HIGH (ref 70–99)
Potassium: 3.9 mmol/L (ref 3.5–5.1)
Sodium: 136 mmol/L (ref 135–145)
Total Bilirubin: 0.8 mg/dL (ref 0.3–1.2)
Total Protein: 7.2 g/dL (ref 6.5–8.1)

## 2021-03-09 LAB — CBC WITH DIFFERENTIAL/PLATELET
Abs Immature Granulocytes: 0.05 10*3/uL (ref 0.00–0.07)
Basophils Absolute: 0 10*3/uL (ref 0.0–0.1)
Basophils Relative: 0 %
Eosinophils Absolute: 0 10*3/uL (ref 0.0–0.5)
Eosinophils Relative: 0 %
HCT: 37.7 % (ref 36.0–46.0)
Hemoglobin: 12.5 g/dL (ref 12.0–15.0)
Immature Granulocytes: 1 %
Lymphocytes Relative: 10 %
Lymphs Abs: 0.9 10*3/uL (ref 0.7–4.0)
MCH: 31.3 pg (ref 26.0–34.0)
MCHC: 33.2 g/dL (ref 30.0–36.0)
MCV: 94.3 fL (ref 80.0–100.0)
Monocytes Absolute: 0.5 10*3/uL (ref 0.1–1.0)
Monocytes Relative: 6 %
Neutro Abs: 7.2 10*3/uL (ref 1.7–7.7)
Neutrophils Relative %: 83 %
Platelets: 339 10*3/uL (ref 150–400)
RBC: 4 MIL/uL (ref 3.87–5.11)
RDW: 13.9 % (ref 11.5–15.5)
WBC: 8.6 10*3/uL (ref 4.0–10.5)
nRBC: 0 % (ref 0.0–0.2)

## 2021-03-09 LAB — CORTISOL: Cortisol, Plasma: 0.5 ug/dL

## 2021-03-09 MED ORDER — SODIUM CHLORIDE 0.9 % IV SOLN
200.0000 mg | Freq: Once | INTRAVENOUS | Status: AC
  Administered 2021-03-09: 200 mg via INTRAVENOUS
  Filled 2021-03-09: qty 8

## 2021-03-09 MED ORDER — ZOLEDRONIC ACID 4 MG/100ML IV SOLN
4.0000 mg | INTRAVENOUS | Status: DC
  Administered 2021-03-09: 4 mg via INTRAVENOUS
  Filled 2021-03-09: qty 100

## 2021-03-09 MED ORDER — PALONOSETRON HCL INJECTION 0.25 MG/5ML
0.2500 mg | Freq: Once | INTRAVENOUS | Status: AC
  Administered 2021-03-09: 0.25 mg via INTRAVENOUS
  Filled 2021-03-09: qty 5

## 2021-03-09 MED ORDER — SODIUM CHLORIDE 0.9 % IV SOLN
Freq: Once | INTRAVENOUS | Status: AC
  Filled 2021-03-09: qty 250

## 2021-03-09 MED ORDER — SODIUM CHLORIDE 0.9 % IV SOLN
500.0000 mg/m2 | Freq: Once | INTRAVENOUS | Status: AC
  Administered 2021-03-09: 1000 mg via INTRAVENOUS
  Filled 2021-03-09: qty 40

## 2021-03-09 MED ORDER — SODIUM CHLORIDE 0.9% FLUSH
10.0000 mL | INTRAVENOUS | Status: AC | PRN
  Administered 2021-03-09: 10 mL via INTRAVENOUS
  Filled 2021-03-09: qty 10

## 2021-03-09 MED ORDER — HEPARIN SOD (PORK) LOCK FLUSH 100 UNIT/ML IV SOLN
500.0000 [IU] | Freq: Once | INTRAVENOUS | Status: AC
  Administered 2021-03-09: 500 [IU] via INTRAVENOUS
  Filled 2021-03-09: qty 5

## 2021-03-09 NOTE — Progress Notes (Signed)
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  She underwent operative fixation on 10/12/2020.  MRI of femur showed heterogeneously enhancing osseous lesion within the area which would be nonspecific versus office neoplasm or metastatic lesion.  CT chest abdomen and pelvis with contrast showed an enlarged pretracheal lymph node 2.2 x 1.5 cm and a right paratracheal lymph node measuring 2.7 x 1.3 cm.  Prevascular node measuring 0.3 x 0.9 cm.  5 x 4 mm right middle lobe nodule.  2.8 x 1.9 cm left adrenal lesion.    Reamings from the right femur showed metastatic poorly differentiated carcinoma.  Immunohistochemistry showed was positive for pancytokeratin, CK7 and patchy CK20 with patchy dim expression of TTF-1.  Cells negative for Melan-A, CDX2, PAX8, Napsin A, GATA3, p40, CD56, p16 and thyroglobulin.  Findings compatible with metastatic carcinoma but because of decalcification immunohistochemical staining is unreliable.  Patchy dim staining with TTF-1 suspicious for lung primary but not a definitive diagnosis.   Repeat supraclavicular lymph node biopsy showed metastatic  adenocarcinoma.  Tumor cells positive for CK7 with focal weak staining for TTF-1.  Suggestive of lung origin in the proper clinical context.  Cells were negative for GATA3 PAX8 CDX2 and CK20 and Napsin A.  Foundation 1 liquid biopsy showed  Showed K-ras G12 C, PIK3 CA, KDA P1C171F, KDM 5CE 296      Interval history-patient reports doing well presently.  She is working part-time.  Going through physical therapy for her hip pain which is gradually getting better and she does not using much of her oxycodone.  Appetite and weight have remained stable.  ECOG PS- 1 Pain scale- 0 Opioid associated constipation- no  Review of systems- Review of Systems  Constitutional:  Negative for chills, fever, malaise/fatigue and weight loss.  HENT:  Negative for congestion, ear discharge and nosebleeds.   Eyes:  Negative for blurred vision.  Respiratory:  Negative for cough, hemoptysis, sputum production, shortness of breath and wheezing.   Cardiovascular:  Negative for chest pain, palpitations, orthopnea and claudication.  Gastrointestinal:  Negative for abdominal pain, blood in stool, constipation, diarrhea, heartburn, melena, nausea and vomiting.  Genitourinary:  Negative for dysuria, flank pain, frequency, hematuria and urgency.  Musculoskeletal:  Negative for back pain, joint pain and myalgias.  Skin:  Negative for rash.  Neurological:  Negative for dizziness, tingling, focal weakness, seizures, weakness and headaches.  Endo/Heme/Allergies:  Does not bruise/bleed easily.  Psychiatric/Behavioral:  Negative for depression and suicidal ideas. The patient does not have insomnia.       Allergies  Allergen Reactions   Factive [Gemifloxacin] Rash     Past Medical History:  Diagnosis Date   Abnormal Pap smear of cervix    Anxiety    Depression    Essential hypertension  Femur fracture, right (HCC)    Lung cancer (HCC)    Palpitations    Precordial chest pain      Past Surgical History:   Procedure Laterality Date   BREAST BIOPSY Right 2017   benign   CERVICAL BIOPSY  W/ LOOP ELECTRODE EXCISION     COLONOSCOPY WITH PROPOFOL N/A 07/03/2015   Procedure: COLONOSCOPY WITH PROPOFOL;  Surgeon: Hulen Luster, MD;  Location[facility] ENDOSCOPY;  Service: Gastroenterology;  Laterality: N/A;   ESOPHAGOGASTRODUODENOSCOPY (EGD) WITH PROPOFOL N/A 07/03/2015   Procedure: ESOPHAGOGASTRODUODENOSCOPY (EGD) WITH PROPOFOL;  Surgeon: Hulen Luster, MD;  Location: [facility] ENDOSCOPY;  Service: Gastroenterology;  Laterality: N/A;   INTRAMEDULLARY (IM) NAIL INTERTROCHANTERIC Right 09/15/2020   Procedure: INTRAMEDULLARY (IM) NAIL INTERTROCHANTRIC;  Surgeon: Corky Mull, MD;  Location: [facility] ORS;  Service: Orthopedics;  Laterality: Right;   IR IMAGING GUIDED PORT INSERTION  11/28/2020   LEFT HEART CATH AND CORONARY ANGIOGRAPHY N/A 03/28/2017   Procedure: Left Heart Cath and Coronary Angiography;  Surgeon: Lorretta Harp, MD;  Location: [facility] CV LAB;  Service: Cardiovascular;  Laterality: N/A;    Social History   Socioeconomic History   Marital status: Married    Spouse name: Not on file   Number of children: Not on file   Years of education: Not on file   Highest education level: Not on file  Occupational History   Not on file  Tobacco Use   Smoking status: Former    Packs/day: 1.00    Years: 30.00    Pack years: 30.00    Types: Cigarettes    Quit date: 09/17/2020    Years since quitting: 0.4   Smokeless tobacco: Never  Vaping Use   Vaping Use: Never used  Substance and Sexual Activity   Alcohol use: Not Currently    Alcohol/week: 0.0 standard drinks   Drug use: No   Sexual activity: Yes    Birth control/protection: I.U.D., Post-menopausal  Other Topics Concern   Not on file  Social History Narrative   Lives in [place] with her husband.  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   Social Determinants of Health   Financial Resource Strain: Not  on file  Food Insecurity: Not on file  Transportation Needs: Not on file  Physical Activity: Not on file  Stress: Not on file  Social Connections: Not on file  Intimate Partner Violence: Not on file    Family History  Problem Relation Age of Onset   Diabetes Mother        alive @ 24   Goiter Mother    Heart disease Father        died of MI @ 7   Osteoporosis Maternal Grandmother    Colon cancer Maternal Grandfather    Breast cancer Neg Hx    Ovarian cancer Neg Hx      Current Outpatient Medications:    acetaminophen (TYLENOL) 325 MG tablet, Take 1-2 tablets (325-650 mg total) by mouth every 6 (six) hours as needed for mild pain (pain score 1-3 or temp > 100.5)., Disp: , Rfl:    ALPRAZolam (XANAX) 0.5 MG tablet, TAKE 1 TABLET BY MOUTH DAILY AS NEEDED, Disp: 30 tablet, Rfl: 0   folic acid (FOLVITE) 1 MG tablet, Take 1 tablet (1 mg total) by mouth daily., Disp: 90 tablet, Rfl: 1   lidocaine-prilocaine (EMLA) cream, Apply to affected area once, Disp: 30 g, Rfl: 3   metoprolol succinate (  TOPROL-XL) 25 MG 24 hr tablet, Take 25 mg by mouth daily., Disp: , Rfl:    omeprazole (PRILOSEC) 20 MG capsule, Take 1 capsule by mouth every morning., Disp: , Rfl:    ondansetron (ZOFRAN) 4 MG tablet, Take 1 tablet (4 mg total) by mouth every 8 (eight) hours as needed for nausea or vomiting., Disp: 30 tablet, Rfl: 1   oxyCODONE (OXY IR/ROXICODONE) 5 MG immediate release tablet, Take 1-2 tablets (5-10 mg total) by mouth every 4 (four) hours as needed for moderate pain., Disp: 60 tablet, Rfl: 0   polyethylene glycol (MIRALAX) 17 g packet, Take 17 g by mouth daily as needed for moderate constipation., Disp: 30 each, Rfl: 0   predniSONE (DELTASONE) 10 MG tablet, prednisone 10 mg tablet, Disp: , Rfl:    prochlorperazine (COMPAZINE) 10 MG tablet, Take 1 tablet (10 mg total) by mouth every 6 (six) hours as needed (Nausea or vomiting)., Disp: 30 tablet, Rfl: 1   levonorgestrel (MIRENA) 20 MCG/DAY IUD, by  Intrauterine route. (Patient not taking: Reported on 03/09/2021), Disp: , Rfl:    sertraline (ZOLOFT) 50 MG tablet, Take 1 tablet (50 mg total) by mouth daily. (Patient not taking: Reported on 03/09/2021), Disp: 30 tablet, Rfl: 1 No current facility-administered medications for this visit.  Facility-Administered Medications Ordered in Other Visits:    sodium chloride flush (NS) 0.9 % injection 10 mL, 10 mL, Intravenous, PRN, Sindy Guadeloupe, MD, 10 mL at 03/09/21 0906   Zoledronic Acid (ZOMETA) IVPB 4 mg, 4 mg, Intravenous, Q28 days, Sindy Guadeloupe, MD, Stopped at 11/21/20 1100   Zoledronic Acid (ZOMETA) IVPB 4 mg, 4 mg, Intravenous, Q28 days, Sindy Guadeloupe, MD, Stopped at 03/09/21 1246  Physical exam:  Vitals:   03/09/21 0940  BP: 121/80  Pulse: 73  Resp: 18  Temp: (!) 96.9 F (36.1 C)  SpO2: 98%  Weight: 206 lb 4.8 oz (93.6 kg)   Physical Exam Constitutional:      General: She is not in acute distress. Cardiovascular:     Rate and Rhythm: Normal rate and regular rhythm.     Heart sounds: Normal heart sounds.  Pulmonary:     Effort: Pulmonary effort is normal.     Breath sounds: Normal breath sounds.  Skin:    General: Skin is warm and dry.  Neurological:     Mental Status: She is alert and oriented to person, place, and time.     CMP Latest Ref Rng & Units 03/09/2021  Glucose 70 - 99 mg/dL 168(H)  BUN 6 - 20 mg/dL 15  Creatinine 0.44 - 1.00 mg/dL 0.59  Sodium 135 - 145 mmol/L 136  Potassium 3.5 - 5.1 mmol/L 3.9  Chloride 98 - 111 mmol/L 106  CO2 22 - 32 mmol/L 22  Calcium 8.9 - 10.3 mg/dL 9.1  Total Protein 6.5 - 8.1 g/dL 7.2  Total Bilirubin 0.3 - 1.2 mg/dL 0.8  Alkaline Phos 38 - 126 U/L 92  AST 15 - 41 U/L 23  ALT 0 - 44 U/L 31   CBC Latest Ref Rng & Units 03/09/2021  WBC 4.0 - 10.5 K/uL 8.6  Hemoglobin 12.0 - 15.0 g/dL 12.5  Hematocrit 36.0 - 46.0 % 37.7  Platelets 150 - 400 K/uL 339      Assessment and plan- Patient is a 55 y.o. 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  She is s/p 4 cycles of carbo Alimta Keytruda chemotherapy.  She is here for on treatment assessment prior to cycle 3 of maintenance Alimta and Keytruda  Counts okay to proceed with cycle 3 of Keytruda and Alimta today.  I will see her back in 3 weeks for cycle 4.  Patient will also receive B12 shot at that time.  Plan to repeat scans after 6 weeks.  Patient had a cortisol level checked on 02/13/2021 With a specimen that was collected at 9 AM and cortisol levels were less than 0.4.  A repeat level has been drawn today.  TSH was low at 0.2 although free T4 was normal at 1.9.  I will also get ACTH T3-T4 checked next week.  If labs are consistent with low cortisol and low TSH it would indicate autoimmune hypophysitis.  Neoplasm related pain: Continue as needed oxycodone    Visit Diagnosis 1. Primary malignant neoplasm of right lung metastatic to other site [facility])   2. Encounter for antineoplastic chemotherapy   3. Encounter for monoclonal antibody treatment for malignancy   4. Neoplasm related pain      Dr. Randa Evens, MD, MPH [facility] at [facility] [phone] 03/09/2021 1:11 PM

## 2021-03-09 NOTE — Patient Instructions (Signed)
[facility]  Discharge Instructions: Thank you for choosing [facility] to provide your oncology and hematology care.  If you have a lab appointment with the [facility], please go directly to the [facility] and check in at the registration area.  Wear comfortable clothing and clothing appropriate for easy access to any Portacath or PICC line.   We strive to give you quality time with your provider. You may need to reschedule your appointment if you arrive late (15 or more minutes).  Arriving late affects you and other patients whose appointments are after yours.  Also, if you miss three or more appointments without notifying the office, you may be dismissed from the clinic at the provider's discretion.      For prescription refill requests, have your pharmacy contact our office and allow 72 hours for refills to be completed.    Today you received the following chemotherapy and/or immunotherapy agents Pemetrexed, pembrolizumabZoledronic Acid Injection (Hypercalcemia, Oncology) What is this medication? ZOLEDRONIC ACID (ZOE le dron ik AS id) slows calcium loss from bones. It high calcium levels in the blood from some kinds of cancer. It may be used in otherpeople at risk for bone loss. This medicine may be used for other purposes; ask your health care provider orpharmacist if you have questions. COMMON BRAND NAME(S): Zometa What should I tell my care team before I take this medication? They need to know if you have any of these conditions: cancer dehydration dental disease kidney disease liver disease low levels of calcium in the blood lung or breathing disease (asthma) receiving steroids like dexamethasone or prednisone an unusual or allergic reaction to zoledronic acid, other medicines, foods, dyes, or preservatives pregnant or trying to get pregnant breast-feeding How should I use this medication? This drug is injected into a vein.  It is given by a health care provider in [place] or clinic setting. Talk to your health care provider about the use of this drug in children.Special care may be needed. Overdosage: If you think you have taken too much of this medicine contact apoison control center or emergency room at once. NOTE: This medicine is only for you. Do not share this medicine with others. What if I miss a dose? Keep appointments for follow-up doses. It is important not to miss your dose.Call your health care provider if you are unable to keep an appointment. What may interact with this medication? certain antibiotics given by injection NSAIDs, medicines for pain and inflammation, like ibuprofen or naproxen some diuretics like bumetanide, furosemide teriparatide thalidomide This list may not describe all possible interactions. Give your health care provider a list of all the medicines, herbs, non-prescription drugs, or dietary supplements you use. Also tell them if you smoke, drink alcohol, or use illegaldrugs. Some items may interact with your medicine. What should I watch for while using this medication? Visit your health care provider for regular checks on your progress. It may besome time before you see the benefit from this drug. Some people who take this drug have severe bone, joint, or muscle pain. This drug may also increase your risk for jaw problems or a broken thigh bone. Tell your health care provider right away if you have severe pain in your jaw, bones, joints, or muscles. Tell you health care provider if you have any painthat does not go away or that gets worse. Tell your dentist and dental surgeon that you are taking this drug. You should not have  major dental surgery while on this drug. See your dentist to have a dental exam and fix any dental problems before starting this drug. Take good care of your teeth while on this drug. Make sure you see your dentist forregular follow-up appointments. You should  make sure you get enough calcium and vitamin D while you are taking this drug. Discuss the foods you eat and the vitamins you take with your healthcare provider. Check with your health care provider if you have severe diarrhea, nausea, and vomiting, or if you sweat a lot. The loss of too much body fluid may make itdangerous for you to take this drug. You may need blood work done while you are taking this drug. Do not become pregnant while taking this drug. Women should inform their health care provider if they wish to become pregnant or think they might be pregnant. There is potential for serious harm to an unborn child. Talk to your healthcare provider for more information. What side effects may I notice from receiving this medication? Side effects that you should report to your doctor or health care provider assoon as possible: allergic reactions (skin rash, itching or hives; swelling of the face, lips, or tongue) bone pain infection (fever, chills, cough, sore throat, pain or trouble passing urine) jaw pain, especially after dental work joint pain kidney injury (trouble passing urine or change in the amount of urine) low blood pressure (dizziness; feeling faint or lightheaded, falls; unusually weak or tired) low calcium levels (fast heartbeat; muscle cramps or pain; pain, tingling, or numbness in the hands or feet; seizures) low magnesium levels (fast, irregular heartbeat; muscle cramp or pain; muscle weakness; tremors; seizures) low red blood cell counts (trouble breathing; feeling faint; lightheaded, falls; unusually weak or tired) muscle pain redness, blistering, peeling, or loosening of the skin, including inside the mouth severe diarrhea swelling of the ankles, feet, hands trouble breathing Side effects that usually do not require medical attention (report to yourdoctor or health care provider if they continue or are bothersome): anxious constipation coughing depressed mood eye  irritation, itching, or pain fever general ill feeling or flu-like symptoms nausea pain, redness, or irritation at site where injected trouble sleeping This list may not describe all possible side effects. Call your doctor for medical advice about side effects. You may report side effects to FDA at1-800-FDA-1088. Where should I keep my medication? This drug is given in a hospital or clinic. It will not be stored at home. NOTE: This sheet is a summary. It may not cover all possible information. If you have questions about this medicine, talk to your doctor, pharmacist, orhealth care provider.  2022 Elsevier/Gold Standard (2019-06-14 09:13:00) Pembrolizumab injection What is this medication? PEMBROLIZUMAB (pem broe liz ue mab) is a monoclonal antibody. It is used totreat certain types of cancer. This medicine may be used for other purposes; ask your health care provider orpharmacist if you have questions. COMMON BRAND NAME(S): Keytruda What should I tell my care team before I take this medication? They need to know if you have any of these conditions: autoimmune diseases like Crohn's disease, ulcerative colitis, or lupus have had or planning to have an allogeneic stem cell transplant (uses someone else's stem cells) history of organ transplant history of chest radiation nervous system problems like myasthenia gravis or Guillain-Barre syndrome an unusual or allergic reaction to pembrolizumab, other medicines, foods, dyes, or preservatives pregnant or trying to get pregnant breast-feeding How should I use this medication? This medicine is for  infusion into a vein. It is given by a health careprofessional in a hospital or clinic setting. A special MedGuide will be given to you before each treatment. Be sure to readthis information carefully each time. Talk to your pediatrician regarding the use of this medicine in children. While this drug may be prescribed for children as young as 6 months  for selectedconditions, precautions do apply. Overdosage: If you think you have taken too much of this medicine contact apoison control center or emergency room at once. NOTE: This medicine is only for you. Do not share this medicine with others. What if I miss a dose? It is important not to miss your dose. Call your doctor or health careprofessional if you are unable to keep an appointment. What may interact with this medication? Interactions have not been studied. This list may not describe all possible interactions. Give your health care provider a list of all the medicines, herbs, non-prescription drugs, or dietary supplements you use. Also tell them if you smoke, drink alcohol, or use illegaldrugs. Some items may interact with your medicine. What should I watch for while using this medication? Your condition will be monitored carefully while you are receiving thismedicine. You may need blood work done while you are taking this medicine. Do not become pregnant while taking this medicine or for 4 months after stopping it. Women should inform their doctor if they wish to become pregnant or think they might be pregnant. There is a potential for serious side effects to an unborn child. Talk to your health care professional or pharmacist for more information. Do not breast-feed an infant while taking this medicine orfor 4 months after the last dose. What side effects may I notice from receiving this medication? Side effects that you should report to your doctor or health care professionalas soon as possible: allergic reactions like skin rash, itching or hives, swelling of the face, lips, or tongue bloody or black, tarry breathing problems changes in vision chest pain chills confusion constipation cough diarrhea dizziness or feeling faint or lightheaded fast or irregular heartbeat fever flushing joint pain low blood counts - this medicine may decrease the number of white blood cells, red  blood cells and platelets. You may be at increased risk for infections and bleeding. muscle pain muscle weakness pain, tingling, numbness in the hands or feet persistent headache redness, blistering, peeling or loosening of the skin, including inside the mouth signs and symptoms of high blood sugar such as dizziness; dry mouth; dry skin; fruity breath; nausea; stomach pain; increased hunger or thirst; increased urination signs and symptoms of kidney injury like trouble passing urine or change in the amount of urine signs and symptoms of liver injury like dark urine, light-colored stools, loss of appetite, nausea, right upper belly pain, yellowing of the eyes or skin sweating swollen lymph nodes weight loss Side effects that usually do not require medical attention (report to yourdoctor or health care professional if they continue or are bothersome): decreased appetite hair loss tiredness This list may not describe all possible side effects. Call your doctor for medical advice about side effects. You may report side effects to FDA at1-800-FDA-1088. Where should I keep my medication? This drug is given in a hospital or clinic and will not be stored at home. NOTE: This sheet is a summary. It may not cover all possible information. If you have questions about this medicine, talk to your doctor, pharmacist, orhealth care provider.  2022 Elsevier/Gold Standard (2019-08-01 21:44:53) Pemetrexed injection  What is this medication? PEMETREXED (PEM e TREX ed) is a chemotherapy drug used to treat lung cancers like non-small cell lung cancer and mesothelioma. It may also be used to treatother cancers. This medicine may be used for other purposes; ask your health care provider orpharmacist if you have questions. COMMON BRAND NAME(S): Alimta What should I tell my care team before I take this medication? They need to know if you have any of these conditions: infection (especially a virus infection such  as chickenpox, cold sores, or herpes) kidney disease low blood counts, like low white cell, platelet, or red cell counts lung or breathing disease, like asthma radiation therapy an unusual or allergic reaction to pemetrexed, other medicines, foods, dyes, or preservative pregnant or trying to get pregnant breast-feeding How should I use this medication? This drug is given as an infusion into a vein. It is administered in a hospitalor clinic by a specially trained health care professional. Talk to your pediatrician regarding the use of this medicine in children.Special care may be needed. Overdosage: If you think you have taken too much of this medicine contact apoison control center or emergency room at once. NOTE: This medicine is only for you. Do not share this medicine with others. What if I miss a dose? It is important not to miss your dose. Call your doctor or health careprofessional if you are unable to keep an appointment. What may interact with this medication? This medicine may interact with the following medications: Ibuprofen This list may not describe all possible interactions. Give your health care provider a list of all the medicines, herbs, non-prescription drugs, or dietary supplements you use. Also tell them if you smoke, drink alcohol, or use illegaldrugs. Some items may interact with your medicine. What should I watch for while using this medication? Visit your doctor for checks on your progress. This drug may make you feel generally unwell. This is not uncommon, as chemotherapy can affect healthy cells as well as cancer cells. Report any side effects. Continue your course oftreatment even though you feel ill unless your doctor tells you to stop. In some cases, you may be given additional medicines to help with side effects.Follow all directions for their use. Call your doctor or health care professional for advice if you get a fever, chills or sore throat, or other symptoms  of a cold or flu. Do not treat yourself. This drug decreases your body's ability to fight infections. Try toavoid being around people who are sick. This medicine may increase your risk to bruise or bleed. Call your doctor orhealth care professional if you notice any unusual bleeding. Be careful brushing and flossing your teeth or using a toothpick because you may get an infection or bleed more easily. If you have any dental work done,tell your dentist you are receiving this medicine. Avoid taking products that contain aspirin, acetaminophen, ibuprofen, naproxen, or ketoprofen unless instructed by your doctor. These medicines may hide afever. Call your doctor or health care professional if you get diarrhea or mouthsores. Do not treat yourself. To protect your kidneys, drink water or other fluids as directed while you aretaking this medicine. Do not become pregnant while taking this medicine or for 6 months after stopping it. Women should inform their doctor if they wish to become pregnant or think they might be pregnant. Men should not father a child while taking this medicine and for 3 months after stopping it. This may interfere with the ability to father a child. You  should talk to your doctor or health care professional if you are concerned about your fertility. There is a potential for serious side effects to an unborn child. Talk to your health care professional or pharmacist for more information. Do not breast-feed an infantwhile taking this medicine or for 1 week after stopping it. What side effects may I notice from receiving this medication? Side effects that you should report to your doctor or health care professionalas soon as possible: allergic reactions like skin rash, itching or hives, swelling of the face, lips, or tongue breathing problems redness, blistering, peeling or loosening of the skin, including inside the mouth signs and symptoms of bleeding such as bloody or black, tarry stools;  red or dark-brown urine; spitting up blood or brown material that looks like coffee grounds; red spots on the skin; unusual bruising or bleeding from the eye, gums, or nose signs and symptoms of infection like fever or chills; cough; sore throat; pain or trouble passing urine signs and symptoms of kidney injury like trouble passing urine or change in the amount of urine signs and symptoms of liver injury like dark yellow or brown urine; general ill feeling or flu-like symptoms; light-colored stools; loss of appetite; nausea; right upper belly pain; unusually weak or tired; yellowing of the eyes or skin Side effects that usually do not require medical attention (report to yourdoctor or health care professional if they continue or are bothersome): constipation mouth sores nausea, vomiting unusually weak or tired This list may not describe all possible side effects. Call your doctor for medical advice about side effects. You may report side effects to FDA at1-800-FDA-1088. Where should I keep my medication? This drug is given in a hospital or clinic and will not be stored at home. NOTE: This sheet is a summary. It may not cover all possible information. If you have questions about this medicine, talk to your doctor, pharmacist, orhealth care provider.  2022 Elsevier/Gold Standard (2017-10-19 16:11:33)       To help prevent nausea and vomiting after your treatment, we encourage you to take your nausea medication as directed.  BELOW ARE SYMPTOMS THAT SHOULD BE REPORTED IMMEDIATELY: *FEVER GREATER THAN 100.4 F (38 C) OR HIGHER *CHILLS OR SWEATING *NAUSEA AND VOMITING THAT IS NOT CONTROLLED WITH YOUR NAUSEA MEDICATION *UNUSUAL SHORTNESS OF BREATH *UNUSUAL BRUISING OR BLEEDING *URINARY PROBLEMS (pain or burning when urinating, or frequent urination) *BOWEL PROBLEMS (unusual diarrhea, constipation, pain near the anus) TENDERNESS IN MOUTH AND THROAT WITH OR WITHOUT PRESENCE OF ULCERS (sore throat,  sores in mouth, or a toothache) UNUSUAL RASH, SWELLING OR PAIN  UNUSUAL VAGINAL DISCHARGE OR ITCHING   Items with * indicate a potential emergency and should be followed up as soon as possible or go to the Emergency Department if any problems should occur.  Please show the CHEMOTHERAPY ALERT CARD or IMMUNOTHERAPY ALERT CARD at check-in to the Emergency Department and triage nurse.  Should you have questions after your visit or need to cancel or reschedule your appointment, please contact [facility]  [phone] and follow the prompts.  Office hours are 8:00 a.m. to 4:30 p.m. Monday - Friday. Please note that voicemails left after 4:00 p.m. may not be returned until the following business day.  We are closed weekends and major holidays. You have access to a nurse at all times for urgent questions. Please call the main number to the clinic [phone] and follow the prompts.  For any non-urgent questions, you may  also contact your provider using MyChart. We now offer e-Visits for anyone 63 and older to request care online for non-urgent symptoms. For details visit [email or web address].   Also download the MyChart app! Go to the app store, search "MyChart", open the app, select [facility], and log in with your MyChart username and password.  Due to Covid, a mask is required upon entering the hospital/clinic. If you do not have a mask, one will be given to you upon arrival. For doctor visits, patients may have 1 support person aged 54 or older with them. For treatment visits, patients cannot have anyone with them due to current Covid guidelines and our immunocompromised population.

## 2021-03-11 ENCOUNTER — Inpatient Hospital Stay: Payer: 59

## 2021-03-11 DIAGNOSIS — C3491 Malignant neoplasm of unspecified part of right bronchus or lung: Secondary | ICD-10-CM

## 2021-03-11 DIAGNOSIS — Z5111 Encounter for antineoplastic chemotherapy: Secondary | ICD-10-CM | POA: Diagnosis not present

## 2021-03-11 LAB — T4, FREE: Free T4: 1.06 ng/dL (ref 0.61–1.12)

## 2021-03-11 LAB — TSH: TSH: 0.297 u[IU]/mL — ABNORMAL LOW (ref 0.350–4.500)

## 2021-03-12 ENCOUNTER — Other Ambulatory Visit: Payer: Self-pay

## 2021-03-12 LAB — ACTH: C206 ACTH: <1.5 pg/mL — ABNORMAL LOW (ref 7.2–63.3)

## 2021-03-12 LAB — T3: T3, Total: 105 ng/dL (ref 71–180)

## 2021-03-12 MED ORDER — HYDROCORTISONE 10 MG PO TABS: 10.0000 mg | ORAL_TABLET | Freq: Every evening | ORAL | 1 refills | Status: DC

## 2021-03-12 MED ORDER — HYDROCORTISONE 20 MG PO TABS: 20.0000 mg | ORAL_TABLET | Freq: Every morning | ORAL | 1 refills | Status: DC

## 2021-03-13 DIAGNOSIS — S62109A Fracture of unspecified carpal bone, unspecified wrist, initial encounter for closed fracture: Secondary | ICD-10-CM

## 2021-03-13 HISTORY — DX: Fracture of unspecified carpal bone, unspecified wrist, initial encounter for closed fracture: S62.109A

## 2021-03-23 DIAGNOSIS — S52502A Unspecified fracture of the lower end of left radius, initial encounter for closed fracture: Secondary | ICD-10-CM

## 2021-03-23 HISTORY — DX: Unspecified fracture of the lower end of left radius, initial encounter for closed fracture: S52.502A

## 2021-03-24 ENCOUNTER — Telehealth: Payer: Self-pay | Admitting: *Deleted

## 2021-03-24 NOTE — Telephone Encounter (Signed)
Patient called to report that she had broken her wrist. She would like to speak with Trinda Pascal Nurse Navigator.

## 2021-03-24 NOTE — Telephone Encounter (Signed)
Spoke with patient regarding fractured wrist. Pt stated fell in her yard and broke her wrist. She went to [facility] who stated that she may need surgery to correct the fracture. Pt stated that she is going to get second opinion from [facility] where she has been seen previously. Advised pt to schedule surgery if needed and that her appts for treatment can be moved around her surgery scheduled if needed. Pt verbalized understanding and will call back to give further updates.

## 2021-03-27 ENCOUNTER — Telehealth: Payer: Self-pay | Admitting: *Deleted

## 2021-03-27 ENCOUNTER — Other Ambulatory Visit: Payer: Self-pay | Admitting: Surgery

## 2021-03-27 NOTE — Telephone Encounter (Signed)
Pt came by the clinic to inform that she will be scheduled for surgery on Tues 7/19 to correct wrist fracture. Pt is scheduled for chemo on Mon 7/18 and would like to know if needs to keep appt as scheduled or if needs to be rescheduled to after surgery.   Please advise.

## 2021-03-27 NOTE — Telephone Encounter (Signed)
Rescheduled treatment 1+ week. Left her VM and sent Mychart.

## 2021-03-27 NOTE — Telephone Encounter (Signed)
Push it out by 1 week

## 2021-03-30 ENCOUNTER — Inpatient Hospital Stay: Payer: 59

## 2021-03-30 ENCOUNTER — Other Ambulatory Visit: Payer: Self-pay

## 2021-03-30 ENCOUNTER — Encounter
Admission: RE | Admit: 2021-03-30 | Discharge: 2021-03-30 | Disposition: A | Discharge status: Home / Self Care | Payer: 59 | Source: Ambulatory Visit | Attending: Surgery | Admitting: Surgery

## 2021-03-30 ENCOUNTER — Inpatient Hospital Stay: Payer: 59 | Admitting: Oncology

## 2021-03-30 HISTORY — DX: Gastro-esophageal reflux disease without esophagitis: K21.9

## 2021-03-30 HISTORY — DX: Diverticulosis of intestine, part unspecified, without perforation or abscess without bleeding: K57.90

## 2021-03-30 MED ORDER — CEFAZOLIN SODIUM-DEXTROSE 2-4 GM/100ML-% IV SOLN
2.0000 g | INTRAVENOUS | Status: AC
  Administered 2021-03-31: 2 g via INTRAVENOUS

## 2021-03-30 MED ORDER — ORAL CARE MOUTH RINSE: 15.0000 mL | Freq: Once | OROMUCOSAL | Status: AC

## 2021-03-30 MED ORDER — CHLORHEXIDINE GLUCONATE 0.12 % MT SOLN: 15.0000 mL | Freq: Once | OROMUCOSAL | Status: AC

## 2021-03-30 MED ORDER — LACTATED RINGERS IV SOLN: INTRAVENOUS | Status: DC

## 2021-03-30 NOTE — Patient Instructions (Addendum)
Your procedure is scheduled on: Tuesday, July 19 Report to the Registration Desk on the 1st floor of the [facility]. To find out your arrival time, please call [phone] between 1PM - 3PM on: Monday, July 18  REMEMBER: Instructions that are not followed completely may result in serious medical risk, up to and including death; or upon the discretion of your surgeon and anesthesiologist your surgery may need to be rescheduled.  Do not eat food after midnight the night before surgery.  No gum chewing, lozengers or hard candies.  You may however, drink CLEAR liquids up to 2 hours before you are scheduled to arrive for your surgery. Do not drink anything within 2 hours of your scheduled arrival time.  Clear liquids include: - water  - apple juice without pulp - gatorade (not RED, PURPLE, OR BLUE) - black coffee or tea (Do NOT add milk or creamers to the coffee or tea) Do NOT drink anything that is not on this list.  In addition, your doctor has ordered for you to drink the provided  Ensure Pre-Surgery Clear Carbohydrate Drink  Drinking this carbohydrate drink up to two hours before surgery helps to reduce insulin resistance and improve patient outcomes. Please complete drinking 2 hours prior to scheduled arrival time.  TAKE THESE MEDICATIONS THE MORNING OF SURGERY WITH A SIP OF WATER:  Famotidine (Pepcid) - (take one the night before and one on the morning of surgery - helps to prevent nausea after surgery.) Hydrocortisone Metoprolol Oxycodone if needed for pain Alprazolam if needed for anxiety  One week prior to surgery: Stop Anti-inflammatories (NSAIDS) such as Advil, Aleve, Ibuprofen, Motrin, Naproxen, Naprosyn and Aspirin based products such as Excedrin, Goodys Powder, BC Powder. Stop ANY OVER THE COUNTER supplements until after surgery. You may however, continue to take Tylenol if needed for pain up until the day of surgery.  No Alcohol for 24 hours before or after  surgery.  No Smoking including e-cigarettes for 24 hours prior to surgery.  No chewable tobacco products for at least 6 hours prior to surgery.  No nicotine patches on the day of surgery.  Do not use any "recreational" drugs for at least a week prior to your surgery.  Please be advised that the combination of cocaine and anesthesia may have negative outcomes, up to and including death. If you test positive for cocaine, your surgery will be cancelled.  On the morning of surgery brush your teeth with toothpaste and water, you may rinse your mouth with mouthwash if you wish. Do not swallow any toothpaste or mouthwash.  Do not wear jewelry, make-up, hairpins, clips or nail polish.  Do not wear lotions, powders, or perfumes.   Do not shave body from the neck down 48 hours prior to surgery just in case you cut yourself which could leave a site for infection.  Also, freshly shaved skin may become irritated if using the CHG soap.  Do not bring valuables to the hospital. [facility] is not responsible for any missing/lost belongings or valuables.   Use CHG Soap as directed on instruction sheet.  Notify your doctor if there is any change in your medical condition (cold, fever, infection).  Wear comfortable clothing (specific to your surgery type) to the hospital.  After surgery, you can help prevent lung complications by doing breathing exercises.  Take deep breaths and cough every 1-2 hours. Your doctor may order a device called an Incentive Spirometer to help you take deep breaths.  If you  are being discharged the day of surgery, you will not be allowed to drive home. You will need a responsible adult (18 years or older) to drive you home and stay with you that night.   If you are taking public transportation, you will need to have a responsible adult (18 years or older) with you. Please confirm with your physician that it is acceptable to use public transportation.   Please call the  [facility] at [phone] if you have any questions about these instructions.  Surgery Visitation Policy:  Patients undergoing a surgery or procedure may have one family member or support person with them as long as that person is not COVID-19 positive or experiencing its symptoms.  That person may remain in the waiting area during the procedure.

## 2021-03-31 ENCOUNTER — Encounter
Admission: RE | Disposition: A | Discharge status: Home / Self Care | Payer: Self-pay | Source: Home / Self Care | Attending: Surgery

## 2021-03-31 ENCOUNTER — Ambulatory Visit: Payer: 59 | Admitting: Anesthesiology

## 2021-03-31 ENCOUNTER — Ambulatory Visit: Payer: 59

## 2021-03-31 ENCOUNTER — Encounter: Payer: Self-pay | Admitting: Surgery

## 2021-03-31 ENCOUNTER — Ambulatory Visit
Admission: RE | Admit: 2021-03-31 | Discharge: 2021-03-31 | Disposition: A | Discharge status: Home / Self Care | Payer: 59 | Attending: Surgery | Admitting: Surgery

## 2021-03-31 ENCOUNTER — Other Ambulatory Visit: Payer: Self-pay

## 2021-03-31 DIAGNOSIS — C779 Secondary and unspecified malignant neoplasm of lymph node, unspecified: Secondary | ICD-10-CM | POA: Insufficient documentation

## 2021-03-31 DIAGNOSIS — C3490 Malignant neoplasm of unspecified part of unspecified bronchus or lung: Secondary | ICD-10-CM | POA: Diagnosis not present

## 2021-03-31 DIAGNOSIS — Z881 Allergy status to other antibiotic agents status: Secondary | ICD-10-CM | POA: Diagnosis not present

## 2021-03-31 DIAGNOSIS — S52552A Other extraarticular fracture of lower end of left radius, initial encounter for closed fracture: Secondary | ICD-10-CM | POA: Insufficient documentation

## 2021-03-31 DIAGNOSIS — Z79899 Other long term (current) drug therapy: Secondary | ICD-10-CM | POA: Diagnosis not present

## 2021-03-31 DIAGNOSIS — Z7952 Long term (current) use of systemic steroids: Secondary | ICD-10-CM | POA: Diagnosis not present

## 2021-03-31 DIAGNOSIS — C7951 Secondary malignant neoplasm of bone: Secondary | ICD-10-CM | POA: Insufficient documentation

## 2021-03-31 DIAGNOSIS — Z793 Long term (current) use of hormonal contraceptives: Secondary | ICD-10-CM | POA: Insufficient documentation

## 2021-03-31 DIAGNOSIS — W010XXA Fall on same level from slipping, tripping and stumbling without subsequent striking against object, initial encounter: Secondary | ICD-10-CM | POA: Diagnosis not present

## 2021-03-31 DIAGNOSIS — Z87891 Personal history of nicotine dependence: Secondary | ICD-10-CM | POA: Diagnosis not present

## 2021-03-31 DIAGNOSIS — C7970 Secondary malignant neoplasm of unspecified adrenal gland: Secondary | ICD-10-CM | POA: Insufficient documentation

## 2021-03-31 DIAGNOSIS — Y93H2 Activity, gardening and landscaping: Secondary | ICD-10-CM | POA: Diagnosis not present

## 2021-03-31 HISTORY — PX: ORIF WRIST FRACTURE: SHX2133

## 2021-03-31 SURGERY — OPEN REDUCTION INTERNAL FIXATION (ORIF) WRIST FRACTURE
Anesthesia: Choice | Site: Wrist | Laterality: Left

## 2021-03-31 MED ORDER — MIDAZOLAM HCL 2 MG/2ML IJ SOLN
INTRAMUSCULAR | Status: AC
  Filled 2021-03-31: qty 2

## 2021-03-31 MED ORDER — ACETAMINOPHEN 10 MG/ML IV SOLN
INTRAVENOUS | Status: DC | PRN
  Administered 2021-03-31: 1000 mg via INTRAVENOUS

## 2021-03-31 MED ORDER — FENTANYL CITRATE (PF) 100 MCG/2ML IJ SOLN: 25.0000 ug | INTRAMUSCULAR | Status: DC | PRN

## 2021-03-31 MED ORDER — MIDAZOLAM HCL 2 MG/2ML IJ SOLN
INTRAMUSCULAR | Status: DC | PRN
  Administered 2021-03-31: 2 mg via INTRAVENOUS

## 2021-03-31 MED ORDER — CEFAZOLIN SODIUM-DEXTROSE 2-4 GM/100ML-% IV SOLN
INTRAVENOUS | Status: AC
  Filled 2021-03-31: qty 100

## 2021-03-31 MED ORDER — FENTANYL CITRATE (PF) 100 MCG/2ML IJ SOLN
INTRAMUSCULAR | Status: DC | PRN
  Administered 2021-03-31: 25 ug via INTRAVENOUS
  Administered 2021-03-31 (×2): 50 ug via INTRAVENOUS
  Administered 2021-03-31: 100 ug via INTRAVENOUS
  Administered 2021-03-31: 50 ug via INTRAVENOUS
  Administered 2021-03-31: 25 ug via INTRAVENOUS

## 2021-03-31 MED ORDER — BUPIVACAINE HCL (PF) 0.5 % IJ SOLN
INTRAMUSCULAR | Status: DC | PRN
  Administered 2021-03-31: 20 mL

## 2021-03-31 MED ORDER — LIDOCAINE HCL (CARDIAC) PF 100 MG/5ML IV SOSY
PREFILLED_SYRINGE | INTRAVENOUS | Status: DC | PRN
  Administered 2021-03-31: 100 mg via INTRAVENOUS

## 2021-03-31 MED ORDER — FENTANYL CITRATE (PF) 100 MCG/2ML IJ SOLN
INTRAMUSCULAR | Status: AC
  Filled 2021-03-31: qty 2

## 2021-03-31 MED ORDER — OXYCODONE HCL 5 MG PO TABS: 5.0000 mg | ORAL_TABLET | ORAL | Status: DC | PRN

## 2021-03-31 MED ORDER — PROMETHAZINE HCL 25 MG/ML IJ SOLN: 6.2500 mg | INTRAMUSCULAR | Status: DC | PRN

## 2021-03-31 MED ORDER — BUPIVACAINE HCL (PF) 0.5 % IJ SOLN
INTRAMUSCULAR | Status: AC
  Filled 2021-03-31: qty 30

## 2021-03-31 MED ORDER — ONDANSETRON HCL 4 MG/2ML IJ SOLN: 4.0000 mg | Freq: Four times a day (QID) | INTRAMUSCULAR | Status: DC | PRN

## 2021-03-31 MED ORDER — ACETAMINOPHEN 10 MG/ML IV SOLN
INTRAVENOUS | Status: AC
  Filled 2021-03-31: qty 100

## 2021-03-31 MED ORDER — ONDANSETRON HCL 4 MG PO TABS: 4.0000 mg | ORAL_TABLET | Freq: Four times a day (QID) | ORAL | Status: DC | PRN

## 2021-03-31 MED ORDER — PROPOFOL 10 MG/ML IV BOLUS
INTRAVENOUS | Status: AC
  Filled 2021-03-31: qty 20

## 2021-03-31 MED ORDER — PROPOFOL 10 MG/ML IV BOLUS
INTRAVENOUS | Status: DC | PRN
  Administered 2021-03-31: 170 mg via INTRAVENOUS

## 2021-03-31 MED ORDER — OXYCODONE HCL 5 MG PO TABS
ORAL_TABLET | ORAL | Status: AC
  Filled 2021-03-31: qty 1

## 2021-03-31 MED ORDER — HYDROMORPHONE HCL 1 MG/ML IJ SOLN
INTRAMUSCULAR | Status: AC
  Filled 2021-03-31: qty 1

## 2021-03-31 MED ORDER — METOCLOPRAMIDE HCL 5 MG/ML IJ SOLN: 5.0000 mg | Freq: Three times a day (TID) | INTRAMUSCULAR | Status: DC | PRN

## 2021-03-31 MED ORDER — OXYCODONE HCL 5 MG PO TABS
5.0000 mg | ORAL_TABLET | Freq: Once | ORAL | Status: AC | PRN
Start: 2021-03-31 — End: 2021-03-31
  Administered 2021-03-31: 5 mg via ORAL

## 2021-03-31 MED ORDER — METOCLOPRAMIDE HCL 10 MG PO TABS: 5.0000 mg | ORAL_TABLET | Freq: Three times a day (TID) | ORAL | Status: DC | PRN

## 2021-03-31 MED ORDER — OXYCODONE HCL 5 MG PO TABS: 5.0000 mg | ORAL_TABLET | Freq: Four times a day (QID) | ORAL | 0 refills | Status: DC | PRN

## 2021-03-31 MED ORDER — DEXAMETHASONE SODIUM PHOSPHATE 10 MG/ML IJ SOLN
INTRAMUSCULAR | Status: DC | PRN
  Administered 2021-03-31: 10 mg via INTRAVENOUS

## 2021-03-31 MED ORDER — OXYCODONE HCL 5 MG/5ML PO SOLN: 5.0000 mg | Freq: Once | ORAL | Status: AC | PRN

## 2021-03-31 MED ORDER — ACETAMINOPHEN 500 MG PO TABS: 1000.0000 mg | ORAL_TABLET | Freq: Once | ORAL | Status: DC

## 2021-03-31 MED ORDER — SODIUM CHLORIDE 0.9 % IV SOLN: INTRAVENOUS | Status: DC

## 2021-03-31 MED ORDER — CHLORHEXIDINE GLUCONATE 0.12 % MT SOLN
OROMUCOSAL | Status: AC
  Administered 2021-03-31: 15 mL via OROMUCOSAL
  Filled 2021-03-31: qty 15

## 2021-03-31 MED ORDER — KETOROLAC TROMETHAMINE 30 MG/ML IJ SOLN
INTRAMUSCULAR | Status: DC | PRN
  Administered 2021-03-31: 30 mg via INTRAVENOUS

## 2021-03-31 MED ORDER — ONDANSETRON HCL 4 MG/2ML IJ SOLN
INTRAMUSCULAR | Status: DC | PRN
  Administered 2021-03-31: 4 mg via INTRAVENOUS

## 2021-03-31 SURGICAL SUPPLY — 67 items
APL PRP STRL LF DISP 70% ISPRP (MISCELLANEOUS) ×2
BIT DRILL 2.2 SS TIBIAL (BIT) ×1 IMPLANT
BNDG COHESIVE 4X5 TAN STRL (GAUZE/BANDAGES/DRESSINGS) ×2 IMPLANT
BNDG ELASTIC 4X5.8 VLCR STR LF (GAUZE/BANDAGES/DRESSINGS) ×2 IMPLANT
BNDG ESMARK 4X12 TAN STRL LF (GAUZE/BANDAGES/DRESSINGS) ×2 IMPLANT
CANISTER SUCT 1200ML W/VALVE (MISCELLANEOUS) ×1 IMPLANT
CHLORAPREP W/TINT 26 (MISCELLANEOUS) ×4 IMPLANT
CORD BIP STRL DISP 12FT (MISCELLANEOUS) ×2 IMPLANT
CUFF TOURN SGL QUICK 18X4 (TOURNIQUET CUFF) ×1 IMPLANT
DRAPE FLUOR MINI C-ARM 54X84 (DRAPES) ×2 IMPLANT
DRAPE ORTHO SPLIT 77X108 STRL (DRAPES)
DRAPE SURG 17X11 SM STRL (DRAPES) ×2 IMPLANT
DRAPE SURG ORHT 6 SPLT 77X108 (DRAPES) ×1 IMPLANT
DRAPE U-SHAPE 47X51 STRL (DRAPES) ×2 IMPLANT
ELECT CAUTERY BLADE 6.4 (BLADE) ×2 IMPLANT
ELECT REM PT RETURN 9FT ADLT (ELECTROSURGICAL) ×2
ELECTRODE REM PT RTRN 9FT ADLT (ELECTROSURGICAL) ×1 IMPLANT
FORCEPS JEWEL BIP 4-3/4 STR (INSTRUMENTS) ×1 IMPLANT
GAUZE 4X4 16PLY ~~LOC~~+RFID DBL (SPONGE) ×2 IMPLANT
GAUZE SPONGE 4X4 12PLY STRL (GAUZE/BANDAGES/DRESSINGS) ×2 IMPLANT
GAUZE XEROFORM 1X8 LF (GAUZE/BANDAGES/DRESSINGS) ×1 IMPLANT
GLOVE SURG ENC MOIS LTX SZ8 (GLOVE) ×2 IMPLANT
GLOVE SURG UNDER LTX SZ8 (GLOVE) ×2 IMPLANT
GOWN STRL REUS W/ TWL LRG LVL3 (GOWN DISPOSABLE) ×1 IMPLANT
GOWN STRL REUS W/ TWL XL LVL3 (GOWN DISPOSABLE) ×1 IMPLANT
GOWN STRL REUS W/TWL LRG LVL3 (GOWN DISPOSABLE) ×2
GOWN STRL REUS W/TWL XL LVL3 (GOWN DISPOSABLE) ×2
K-WIRE 1.6 (WIRE) ×4
K-WIRE FX5X1.6XNS BN SS (WIRE) ×2
KIT TURNOVER KIT A (KITS) ×2 IMPLANT
KWIRE FX5X1.6XNS BN SS (WIRE) IMPLANT
MANIFOLD NEPTUNE II (INSTRUMENTS) ×2 IMPLANT
NDL FILTER BLUNT 18X1 1/2 (NEEDLE) ×1 IMPLANT
NEEDLE FILTER BLUNT 18X 1/2SAF (NEEDLE) ×1
NEEDLE FILTER BLUNT 18X1 1/2 (NEEDLE) ×1 IMPLANT
NS IRRIG 500ML POUR BTL (IV SOLUTION) ×2 IMPLANT
PACK EXTREMITY ARMC (MISCELLANEOUS) ×2 IMPLANT
PADDING CAST 3IN STRL (MISCELLANEOUS) ×1
PADDING CAST 4IN STRL (MISCELLANEOUS)
PADDING CAST BLEND 3X4 STRL (MISCELLANEOUS) IMPLANT
PADDING CAST BLEND 4X4 STRL (MISCELLANEOUS) ×2 IMPLANT
PEG LOCKING SMOOTH 2.2X16 (Screw) ×1 IMPLANT
PEG LOCKING SMOOTH 2.2X18 (Peg) ×4 IMPLANT
PEG LOCKING SMOOTH 2.2X20 (Screw) ×1 IMPLANT
PLATE NARROW DVR LEFT (Plate) ×1 IMPLANT
SCREW  LP NL 2.7X16MM (Screw) ×1 IMPLANT
SCREW 2.7X14MM (Screw) ×1 IMPLANT
SCREW BN 14X2.7XNONLOCK 3 LD (Screw) IMPLANT
SCREW LOCK 14X2.7X 3 LD TPR (Screw) IMPLANT
SCREW LOCK 16X2.7X 3 LD TPR (Screw) IMPLANT
SCREW LOCKING 2.7X14 (Screw) ×2 IMPLANT
SCREW LOCKING 2.7X16 (Screw) ×2 IMPLANT
SCREW LP NL 2.7X16MM (Screw) IMPLANT
SCREW NLOCK 2.7X14 (Screw) ×1 IMPLANT
SLING ARM M TX990204 (SOFTGOODS) ×1 IMPLANT
SPLINT CAST 1 STEP 3X12 (MISCELLANEOUS) ×1 IMPLANT
SPLINT CAST 1 STEP 4X15 (MISCELLANEOUS) IMPLANT
STAPLER SKIN PROX 35W (STAPLE) ×2 IMPLANT
STOCKINETTE IMPERVIOUS 9X36 MD (GAUZE/BANDAGES/DRESSINGS) ×2 IMPLANT
STRIP CLOSURE SKIN 1/4X4 (GAUZE/BANDAGES/DRESSINGS) ×1 IMPLANT
SUT PROLENE 4 0 PS 2 18 (SUTURE) ×1 IMPLANT
SUT VIC AB 2-0 SH 27 (SUTURE) ×2
SUT VIC AB 2-0 SH 27XBRD (SUTURE) ×1 IMPLANT
SUT VIC AB 3-0 SH 27 (SUTURE) ×2
SUT VIC AB 3-0 SH 27X BRD (SUTURE) ×1 IMPLANT
SWABSTK COMLB BENZOIN TINCTURE (MISCELLANEOUS) ×1 IMPLANT
SYR 10ML LL (SYRINGE) ×2 IMPLANT

## 2021-03-31 NOTE — Op Note (Signed)
03/31/2021  3:51 PM  Patient:   Gina Sanchez  Pre-Op Diagnosis:   Closed acute extra-articular distal radius fracture Tamala Julian fracture), left wrist.  Post-Op Diagnosis:   Same.  Procedure:   Open reduction and internal fixation of left distal radius fracture.  Surgeon:   Pascal Lux, MD  Assistant:   Samson Frederic, PA-S  Anesthesia:   General LMA  Findings:   As above.  Complications:   None  EBL:   2 cc  Fluids:   700 cc crystalloid  TT:   75 minutes at 250 mmHg  Drains:   None  Closure:   3-0 Vicryl subcuticular sutures  Implants:   Biomet DVR Cross-locked narrow precontoured distal radius mini-locking plate.  Brief Clinical Note:   The patient is a 55 year old female who sustained the above-noted injury 11 days ago when she tripped and fell onto her outstretched left hand while in her yard. She presented to a local urgent care facility where x-rays demonstrated the above-noted injury. She elected to follow-up in our office where a discussion was held as to the potential treatment options available to her. She elected to proceed with surgery and presents at this time for definitive management of her injury.  Procedure:   The patient was brought into the operating room and lain in the supine position. After adequate general laryngal mask anesthesia was obtained, the patient's left hand and upper extremity were prepped with ChloraPrep solution before being draped sterilely. Preoperative antibiotics were administered. A timeout was performed to verify the appropriate surgical site before the limb was exsanguinated with an Esmarch and the tourniquet inflated to 250 mmHg.   An approximately 7-8 cm incision was made over the volar aspect of the distal radius beginning at the volar flexion crease and extending proximally along the flexor carpi radialis tendon. The incision was carried down through the subcutaneous tissues to expose the superficial retinaculum. This was split  the length of the incision directly over the flexor carpi radialis tendon. The FCR sheath was opened and the tendon retracted ulnarly to protect the median nerve. The floor of the FCR sheath was opened to expose the pronator quadratus muscle. This was released along the radial insertion and the muscle was retracted ulnarly to expose the distal radius. The fracture was identified and soft tissues elevated off the distal metaphyseal region for several centimeters.  Given that the fracture was now 48 days old, it was somewhat "sticky", so a small osteotome was used to mobilize the fracture fragment. This permitted near anatomic reduction of the fracture fragment.    The appropriate sized plate was selected and positioned on the distal radius. A guidewire was placed through the distal hole and its position verified using FluoroScan imaging in AP and lateral projections. After several attempts, the pin was positioned parallel to the distal articular surface and approximately 3-4 mm proximal to the articular surface. The plate was secured using a nonlocking bicortical screw proximally. Distally, the holes were filled with locking pegs of the appropriate lengths. The adequacy of hardware position and fracture reduction was verified using FluoroScan imaging in AP, lateral, and several additional oblique projections to be sure that the hardware did not enter the joint nor did it penetrate dorsally. Two additional locking cortical screws were placed to secure the plate to the metaphyseal region proximally. Again the construct was assessed using FluoroScan imaging in AP, lateral, and oblique projections and found to be excellent.  The wound was copiously irrigated with  sterile saline solution before the pronator quadratus was reapproximated using 2-0 Vicryl interrupted sutures. The subcutaneous tissues were closed using 2-0 Vicryl interrupted sutures before the subcuticular layer was closed using 3-0 Vicryl inverted  interrupted sutures. Benzoin and Steri-Strips are applied to the skin. A total of 20 cc of 0.5% plain Sensorcaine was injected in and around the incision site to help with postoperative analgesia before a sterile bulky dressing was applied to the wound. The patient was placed into a volar splint maintaining the wrist in neutral position before the patient was awakened, extubated, and returned to the recovery room in satisfactory condition after tolerating the procedure well.

## 2021-03-31 NOTE — H&P (Signed)
History of Present Illness:  Gina Sanchez is a 55 y.o. female who presents for evaluation and treatment of a 1 week history of left wrist pain following an injury in which she apparently tripped and fell while doing some work in her yard. She presented to [facility] urgent care clinic where x-rays of her left wrist were obtained. She was told that she had a distal radius fracture that would require surgery. Therefore, because of our prior history of working together, the patient has made an appointment with me for further evaluation and treatment of this injury. The patient denies any pain in the wrist on today's visit, but does have panic occasionally for which she will take Tylenol with relief. She also notes some swelling in the wrist and will apply ice, again with temporary partial relief of her symptoms. The patient denies any prior injury to her wrist and denies any numbness or paresthesias to her fingers.  Current Outpatient Medications:  acetaminophen (TYLENOL) 325 MG tablet Take by mouth Take 1-2 tablets (325-650 mg total) by mouth every 6 (six) hours as needed for mild pain (pain score 1-3 or temp > 100.5).   ALPRAZolam (XANAX) 0.5 MG tablet Take 0.5 mg by mouth continuously as needed. 1   dexAMETHasone (DECADRON) 4 MG tablet Take 1 tab two times a day the day before Alimta chemo, then take 2 tabs once a day for 3 days starting the day after cisplatin.   folic acid (FOLVITE) 1 MG tablet   hydrocortisone (CORTEF) 20 MG tablet hydrocortisone 20 mg tablet   levonorgestreL (MIRENA 52 MG) 20 mcg/24 hr (6 years) IUD Insert 1 Intra Uterine Device into the uterus continuously as needed   lidocaine-prilocaine (EMLA) cream   metoprolol tartrate (LOPRESSOR) 25 MG tablet Take 1 tablet by mouth once daily   omeprazole (PRILOSEC) 20 MG DR capsule Take 20 mg by mouth once daily   ondansetron (ZOFRAN) 4 MG tablet Take by mouth Take 4 mg by mouth every 8 (eight) hours as needed for nausea or vomiting.    oxyCODONE (ROXICODONE) 5 MG immediate release tablet Take by mouth Take 1-2 tablets (5-10 mg total) by mouth every 4 (four) hours as needed for moderate pain.   prochlorperazine (COMPAZINE) 10 MG tablet Take by mouth   Allergies:   Factive [Gemifloxacin] Itching   Past Medical History:   Chronic constipation   Colon polyp   Diverticulosis   Hypertension   Past Surgical History:   COLONOSCOPY 07/03/2015 (Diverticulosis/Otherwise normal/Repeat 19yr/PYO)   EGD 07/03/2015 (Esophagus dilated/Otherwise normal/No Repeat/PYO)   Reduction and internal fixation of closed displaced pathologic right femoral shaft fracture with Biomet Affixis TFN nail. Right 09/15/2020 (Dr. PRoland Rack   Family History:   Diabetes type II Mother   Myocardial Infarction (Heart attack) Father   Colon cancer Maternal Grandfather   Skin cancer Maternal Grandfather   Social History:   Socioeconomic History:   Marital status: Married  Tobacco Use   Smoking status: Former Smoker  Packs/day: 1.00  Quit date: 09/17/2020  Years since quitting: 0.5   Smokeless tobacco: Never Used  Vaping Use   Vaping Use: Never used  Substance and Sexual Activity   Alcohol use: Not Currently   Review of Systems:  A comprehensive 14 point ROS was performed, reviewed, and the pertinent orthopaedic findings are documented in the HPI.  Physical Exam: Vitals:  03/27/21 1102  BP: 122/86  Weight: 92.5 kg (204 lb)  Height: 167.6 cm (5' 6" )  PainSc: 0-No pain  PainLoc: Wrist   General/Constitutional: The patient appears to be well-nourished, well-developed, and in no acute distress. Neuro/Psych: Normal mood and affect, oriented to person, place and time. Eyes: Non-icteric. Pupils are equal, round, and reactive to light, and exhibit synchronous movement. ENT: Unremarkable. Lymphatic: No palpable adenopathy. Respiratory: Lungs clear to auscultation, Normal chest excursion, No wheezes and Non-labored breathing Cardiovascular:  Regular rate and rhythm. No murmurs. and No edema, swelling or tenderness, except as noted in detailed exam. Integumentary: No impressive skin lesions present, except as noted in detailed exam. Musculoskeletal: Unremarkable, except as noted in detailed exam.  Left wrist exam: Skin inspection of the left wrist is notable for mild swelling over the dorsal and volar aspects of the wrist, as well as some resolving ecchymosis. No erythema, abrasions, or other skin abnormalities are identified. There is no obvious deformity of the wrist. She has mild tenderness to palpation over the volar and radial aspects of the wrist. She has more moderate pain with any attempted active or passive motion of the wrist. She is neurovascularly intact to her left hand, demonstrating the ability to actively flex and extend her fingers. Sensations intact light touch to all digits. She has good capillary refill to all digits.  X-rays/MRI/Lab data:  Recent x-rays of the left wrist are available for review and have been reviewed by myself. These films demonstrate a moderately impacted/volarly displaced Smiths fracture of the left distal radius. The ulna is intact. No significant degenerative changes are identified, although there does appear to be a small degenerative cyst in the lunate. No other lytic lesions or acute bony processes are noted.  Assessment: Closed Smith's fracture of left radius.   Plan: The treatment options were discussed with the patient and her husband. In addition, patient educational materials were provided regarding the diagnosis and treatment options. Although the patient is right-handed, I feel there is sufficient shortening and volar displacement of the fracture that she would benefit from surgical correction of this injury. Therefore, I have recommended that she undergo a formal open reduction and internal fixation of the left distal radius fracture. The procedure was discussed with the patient, as were  the potential risks (including bleeding, infection, nerve and/or blood vessel injury, persistent or recurrent pain, loosening and/or failure of the components, dislocation, malunion, non-union, need for further surgery, blood clots, strokes, heart attacks and/or arhythmias, pneumonia, etc.) and benefits. The patient states her understanding and wishes to proceed. All of the patient's questions and concerns were answered. She can call any time with further concerns. She will follow up post-surgery, routine.   H&P reviewed and patient re-examined. No changes.

## 2021-03-31 NOTE — Discharge Instructions (Signed)
Orthopedic discharge instructions: Keep splint dry and intact. Keep hand elevated above heart level. Apply ice to affected area frequently. Take ibuprofen 600-800 mg TID with meals for 7-10 days, then as necessary. Take pain medication as prescribed or ES Tylenol when needed.  Return for follow-up in 10-14 days or as scheduled.

## 2021-03-31 NOTE — Transfer of Care (Signed)
Immediate Anesthesia Transfer of Care Note  Patient: Gina Sanchez  Procedure(s) Performed: OPEN REDUCTION INTERNAL FIXATION (ORIF) LEFT DISTAL RADIUS FRACTURE. (Left: Wrist)  Patient Location: PACU  Anesthesia Type:General  Level of Consciousness: sedated  Airway & Oxygen Therapy: Patient Spontanous Breathing and Patient connected to face mask oxygen  Post-op Assessment: Report given to RN and Post -op Vital signs reviewed and stable  Post vital signs: Reviewed and stable  Last Vitals:  Vitals Value Taken Time  BP 115/77 03/31/21 1545  Temp 36.1 C 03/31/21 1541  Pulse 80 03/31/21 1600  Resp 12 03/31/21 1600  SpO2 97 % 03/31/21 1600  Vitals shown include unvalidated device data.  Last Pain:  Vitals:   03/31/21 1550  TempSrc:   PainSc: 0-No pain         Complications: No notable events documented.

## 2021-03-31 NOTE — Anesthesia Preprocedure Evaluation (Addendum)
Anesthesia Evaluation  Patient identified by MRN, date of birth, ID band  Reviewed: Allergy & Precautions, NPO status , Patient's Chart, lab work & pertinent test results  History of Anesthesia Complications Negative for: history of anesthetic complications  Airway Mallampati: II  TM Distance: >3 FB Neck ROM: Full    Dental no notable dental hx.    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 on medications Normal cardiovascular exam Rhythm:Regular Rate:Normal     Neuro/Psych Anxiety negative neurological ROS     GI/Hepatic Neg liver ROS, GERD  ,  Endo/Other  Chronic Steroid Therapy  Renal/GU negative Renal ROS     Musculoskeletal Bone Mets   Abdominal   Peds  Hematology negative hematology ROS (+)   Anesthesia Other Findings  Past Medical History: No date: Abnormal Pap smear of cervix No date: Anxiety No date: Depression No date: Essential hypertension No date: Palpitations No date: Precordial chest pain  Past Surgical History: 2017: BREAST BIOPSY; Right     Comment:  benign No date: CERVICAL BIOPSY  W/ LOOP ELECTRODE EXCISION 07/03/2015: COLONOSCOPY WITH PROPOFOL; N/A     Comment:  Procedure: COLONOSCOPY WITH PROPOFOL;  Surgeon: Hulen Luster, MD;  Location: [facility] ENDOSCOPY;  Service:               Gastroenterology;  Laterality: N/A; 07/03/2015: ESOPHAGOGASTRODUODENOSCOPY (EGD) WITH PROPOFOL; N/A     Comment:  Procedure: ESOPHAGOGASTRODUODENOSCOPY (EGD) WITH               PROPOFOL;  Surgeon: Hulen Luster, MD;  Location: [facility]               ENDOSCOPY;  Service: Gastroenterology;  Laterality: N/A; 03/28/2017: LEFT HEART CATH AND CORONARY ANGIOGRAPHY; N/A     Comment:  Procedure: Left Heart Cath and Coronary  Angiography;                Surgeon: Lorretta Harp, MD;  Location: [facility]              [facility];  Service: Cardiovascular;  Laterality: N/A;  BMI    Body Mass Index: 29.91 kg/m   Reproductive/Obstetrics                            Anesthesia Physical Anesthesia Plan  ASA: 3  Anesthesia Plan: General   Post-op Pain Management:    Induction: Intravenous  PONV Risk Score and Plan:   Airway Management Planned: LMA  Additional Equipment:   Intra-op Plan:   Post-operative Plan: Extubation in OR  Informed Consent: I have reviewed the patients History and Physical, chart, labs and discussed the procedure including the risks, benefits and alternatives for the proposed anesthesia with the patient or authorized representative who has indicated his/her understanding and acceptance.     Dental Advisory Given  Plan Discussed with: Anesthesiologist, CRNA and Surgeon  Anesthesia Plan Comments: (Patient consented for risks of anesthesia including but not limited to:  - adverse reactions to medications - damage to eyes, teeth, lips or other oral mucosa - nerve damage due to positioning  - sore throat or hoarseness - Damage to heart, brain, nerves, lungs, other parts of body or loss  of life  Patient voiced understanding.)        Anesthesia Quick Evaluation

## 2021-03-31 NOTE — Anesthesia Postprocedure Evaluation (Signed)
Anesthesia Post Note  Patient: Gina Sanchez  Procedure(s) Performed: OPEN REDUCTION INTERNAL FIXATION (ORIF) LEFT DISTAL RADIUS FRACTURE. (Left: Wrist)  Patient location during evaluation: PACU Anesthesia Type: General Level of consciousness: awake and alert Pain management: pain level controlled Vital Signs Assessment: post-procedure vital signs reviewed and stable Respiratory status: spontaneous breathing, nonlabored ventilation, respiratory function stable and patient connected to nasal cannula oxygen Cardiovascular status: blood pressure returned to baseline and stable Postop Assessment: no apparent nausea or vomiting Anesthetic complications: no   No notable events documented.   Last Vitals:  Vitals:   03/31/21 1608 03/31/21 1613  BP: (!) 142/99 (!) 152/92  Pulse: 78 83  Resp: 13 15  Temp: (!) 36.1 C (!) 36.1 C  SpO2: 96% 98%    Last Pain:  Vitals:   03/31/21 1613  TempSrc: Temporal  PainSc: 1                  Martha Clan

## 2021-03-31 NOTE — Anesthesia Procedure Notes (Signed)
Procedure Name: LMA Insertion Date/Time: 03/31/2021 1:57 PM Performed by: Nelda Marseille, CRNA Pre-anesthesia Checklist: Patient identified, Patient being monitored, Timeout performed, Emergency Drugs available and Suction available Patient Re-evaluated:Patient Re-evaluated prior to induction Oxygen Delivery Method: Circle system utilized Preoxygenation: Pre-oxygenation with 100% oxygen Induction Type: IV induction Ventilation: Mask ventilation without difficulty LMA: LMA inserted LMA Size: 4.0 Tube type: Oral Number of attempts: 1 Placement Confirmation: positive ETCO2 and breath sounds checked- equal and bilateral Tube secured with: Tape Dental Injury: Teeth and Oropharynx as per pre-operative assessment

## 2021-04-01 ENCOUNTER — Encounter: Payer: Self-pay | Admitting: Surgery

## 2021-04-02 ENCOUNTER — Encounter: Payer: Self-pay | Admitting: Surgery

## 2021-04-02 MED ORDER — 0.9 % SODIUM CHLORIDE (POUR BTL) OPTIME
TOPICAL | Status: DC | PRN
  Administered 2021-03-31: 100 mL

## 2021-04-06 ENCOUNTER — Inpatient Hospital Stay (HOSPITAL_BASED_OUTPATIENT_CLINIC_OR_DEPARTMENT_OTHER): Payer: 59 | Admitting: Oncology

## 2021-04-06 ENCOUNTER — Inpatient Hospital Stay: Payer: 59 | Attending: Oncology

## 2021-04-06 ENCOUNTER — Encounter: Payer: Self-pay | Admitting: Oncology

## 2021-04-06 ENCOUNTER — Inpatient Hospital Stay: Payer: 59

## 2021-04-06 ENCOUNTER — Other Ambulatory Visit: Payer: Self-pay

## 2021-04-06 VITALS — BP 124/91 | HR 78 | Temp 97.8°F | Resp 16 | Ht 66.0 in | Wt 206.5 lb

## 2021-04-06 DIAGNOSIS — E2740 Unspecified adrenocortical insufficiency: Secondary | ICD-10-CM | POA: Diagnosis not present

## 2021-04-06 DIAGNOSIS — C7970 Secondary malignant neoplasm of unspecified adrenal gland: Secondary | ICD-10-CM | POA: Insufficient documentation

## 2021-04-06 DIAGNOSIS — E785 Hyperlipidemia, unspecified: Secondary | ICD-10-CM | POA: Insufficient documentation

## 2021-04-06 DIAGNOSIS — I10 Essential (primary) hypertension: Secondary | ICD-10-CM | POA: Diagnosis not present

## 2021-04-06 DIAGNOSIS — R7989 Other specified abnormal findings of blood chemistry: Secondary | ICD-10-CM

## 2021-04-06 DIAGNOSIS — Z5112 Encounter for antineoplastic immunotherapy: Secondary | ICD-10-CM | POA: Diagnosis present

## 2021-04-06 DIAGNOSIS — C7951 Secondary malignant neoplasm of bone: Secondary | ICD-10-CM | POA: Insufficient documentation

## 2021-04-06 DIAGNOSIS — Z5111 Encounter for antineoplastic chemotherapy: Secondary | ICD-10-CM | POA: Insufficient documentation

## 2021-04-06 DIAGNOSIS — C778 Secondary and unspecified malignant neoplasm of lymph nodes of multiple regions: Secondary | ICD-10-CM | POA: Diagnosis not present

## 2021-04-06 DIAGNOSIS — C3491 Malignant neoplasm of unspecified part of right bronchus or lung: Secondary | ICD-10-CM

## 2021-04-06 DIAGNOSIS — G893 Neoplasm related pain (acute) (chronic): Secondary | ICD-10-CM | POA: Insufficient documentation

## 2021-04-06 DIAGNOSIS — K219 Gastro-esophageal reflux disease without esophagitis: Secondary | ICD-10-CM | POA: Insufficient documentation

## 2021-04-06 DIAGNOSIS — Z79899 Other long term (current) drug therapy: Secondary | ICD-10-CM | POA: Diagnosis not present

## 2021-04-06 DIAGNOSIS — E236 Other disorders of pituitary gland: Secondary | ICD-10-CM

## 2021-04-06 DIAGNOSIS — F419 Anxiety disorder, unspecified: Secondary | ICD-10-CM | POA: Insufficient documentation

## 2021-04-06 LAB — CBC WITH DIFFERENTIAL/PLATELET
Abs Immature Granulocytes: 0.04 10*3/uL (ref 0.00–0.07)
Basophils Absolute: 0.1 10*3/uL (ref 0.0–0.1)
Basophils Relative: 1 %
Eosinophils Absolute: 0.2 10*3/uL (ref 0.0–0.5)
Eosinophils Relative: 4 %
HCT: 39.6 % (ref 36.0–46.0)
Hemoglobin: 12.9 g/dL (ref 12.0–15.0)
Immature Granulocytes: 1 %
Lymphocytes Relative: 33 %
Lymphs Abs: 2.2 10*3/uL (ref 0.7–4.0)
MCH: 30.7 pg (ref 26.0–34.0)
MCHC: 32.6 g/dL (ref 30.0–36.0)
MCV: 94.3 fL (ref 80.0–100.0)
Monocytes Absolute: 0.7 10*3/uL (ref 0.1–1.0)
Monocytes Relative: 10 %
Neutro Abs: 3.3 10*3/uL (ref 1.7–7.7)
Neutrophils Relative %: 51 %
Platelets: 319 10*3/uL (ref 150–400)
RBC: 4.2 MIL/uL (ref 3.87–5.11)
RDW: 13.2 % (ref 11.5–15.5)
WBC: 6.5 10*3/uL (ref 4.0–10.5)
nRBC: 0 % (ref 0.0–0.2)

## 2021-04-06 LAB — TSH: TSH: 0.25 u[IU]/mL — ABNORMAL LOW (ref 0.350–4.500)

## 2021-04-06 LAB — COMPREHENSIVE METABOLIC PANEL
ALT: 23 U/L (ref 0–44)
AST: 19 U/L (ref 15–41)
Albumin: 3.7 g/dL (ref 3.5–5.0)
Alkaline Phosphatase: 94 U/L (ref 38–126)
Anion gap: 6 (ref 5–15)
BUN: 20 mg/dL (ref 6–20)
CO2: 23 mmol/L (ref 22–32)
Calcium: 8.7 mg/dL — ABNORMAL LOW (ref 8.9–10.3)
Chloride: 108 mmol/L (ref 98–111)
Creatinine, Ser: 0.71 mg/dL (ref 0.44–1.00)
GFR, Estimated: >60 mL/min (ref >60)
Glucose, Bld: 116 mg/dL — ABNORMAL HIGH (ref 70–99)
Potassium: 3.5 mmol/L (ref 3.5–5.1)
Sodium: 137 mmol/L (ref 135–145)
Total Bilirubin: 0.6 mg/dL (ref 0.3–1.2)
Total Protein: 6.7 g/dL (ref 6.5–8.1)

## 2021-04-06 LAB — T4, FREE: Free T4: 1.23 ng/dL — ABNORMAL HIGH (ref 0.61–1.12)

## 2021-04-06 MED ORDER — SODIUM CHLORIDE 0.9 % IV SOLN
200.0000 mg | Freq: Once | INTRAVENOUS | Status: AC
  Administered 2021-04-06: 200 mg via INTRAVENOUS
  Filled 2021-04-06: qty 8

## 2021-04-06 MED ORDER — SODIUM CHLORIDE 0.9 % IV SOLN
Freq: Once | INTRAVENOUS | Status: AC
  Filled 2021-04-06: qty 250

## 2021-04-06 MED ORDER — SODIUM CHLORIDE 0.9% FLUSH
10.0000 mL | INTRAVENOUS | Status: DC | PRN
  Administered 2021-04-06: 10 mL via INTRAVENOUS
  Filled 2021-04-06: qty 10

## 2021-04-06 MED ORDER — HEPARIN SOD (PORK) LOCK FLUSH 100 UNIT/ML IV SOLN
INTRAVENOUS | Status: AC
  Filled 2021-04-06: qty 5

## 2021-04-06 MED ORDER — PALONOSETRON HCL INJECTION 0.25 MG/5ML
0.2500 mg | Freq: Once | INTRAVENOUS | Status: AC
Start: 2021-04-06 — End: 2021-04-06
  Administered 2021-04-06: 0.25 mg via INTRAVENOUS
  Filled 2021-04-06: qty 5

## 2021-04-06 MED ORDER — CYANOCOBALAMIN 1000 MCG/ML IJ SOLN
1000.0000 ug | INTRAMUSCULAR | Status: DC
  Administered 2021-04-06: 1000 ug via INTRAMUSCULAR
  Filled 2021-04-06: qty 1

## 2021-04-06 MED ORDER — SODIUM CHLORIDE 0.9 % IV SOLN
500.0000 mg/m2 | Freq: Once | INTRAVENOUS | Status: AC
  Administered 2021-04-06: 1000 mg via INTRAVENOUS
  Filled 2021-04-06: qty 40

## 2021-04-06 MED ORDER — HEPARIN SOD (PORK) LOCK FLUSH 100 UNIT/ML IV SOLN
500.0000 [IU] | Freq: Once | INTRAVENOUS | Status: AC
  Administered 2021-04-06: 500 [IU] via INTRAVENOUS
  Filled 2021-04-06: qty 5

## 2021-04-06 MED ORDER — HEPARIN SOD (PORK) LOCK FLUSH 100 UNIT/ML IV SOLN
500.0000 [IU] | Freq: Once | INTRAVENOUS | Status: DC | PRN
  Filled 2021-04-06: qty 5

## 2021-04-06 NOTE — Patient Instructions (Signed)
[facility] ONCOLOGY  Discharge Instructions: Thank you for choosing [facility] to provide your oncology and hematology care.  If you have a lab appointment with the [facility], please go directly to the [facility] and check in at the registration area.  Wear comfortable clothing and clothing appropriate for easy access to any Portacath or PICC line.   We strive to give you quality time with your provider. You may need to reschedule your appointment if you arrive late (15 or more minutes).  Arriving late affects you and other patients whose appointments are after yours.  Also, if you miss three or more appointments without notifying the office, you may be dismissed from the clinic at the provider's discretion.      For prescription refill requests, have your pharmacy contact our office and allow 72 hours for refills to be completed.    Today you received the following chemotherapy and/or immunotherapy agents Keytruda and Alimta        To help prevent nausea and vomiting after your treatment, we encourage you to take your nausea medication as directed.  BELOW ARE SYMPTOMS THAT SHOULD BE REPORTED IMMEDIATELY: *FEVER GREATER THAN 100.4 F (38 C) OR HIGHER *CHILLS OR SWEATING *NAUSEA AND VOMITING THAT IS NOT CONTROLLED WITH YOUR NAUSEA MEDICATION *UNUSUAL SHORTNESS OF BREATH *UNUSUAL BRUISING OR BLEEDING *URINARY PROBLEMS (pain or burning when urinating, or frequent urination) *BOWEL PROBLEMS (unusual diarrhea, constipation, pain near the anus) TENDERNESS IN MOUTH AND THROAT WITH OR WITHOUT PRESENCE OF ULCERS (sore throat, sores in mouth, or a toothache) UNUSUAL RASH, SWELLING OR PAIN  UNUSUAL VAGINAL DISCHARGE OR ITCHING   Items with * indicate a potential emergency and should be followed up as soon as possible or go to the Emergency Department if any problems should occur.  Please show the CHEMOTHERAPY ALERT CARD or IMMUNOTHERAPY ALERT CARD  at check-in to the Emergency Department and triage nurse.  Should you have questions after your visit or need to cancel or reschedule your appointment, please contact [facility]  [phone] and follow the prompts.  Office hours are 8:00 a.m. to 4:30 p.m. Monday - Friday. Please note that voicemails left after 4:00 p.m. may not be returned until the following business day.  We are closed weekends and major holidays. You have access to a nurse at all times for urgent questions. Please call the main number to the clinic [phone] and follow the prompts.  For any non-urgent questions, you may also contact your provider using MyChart. We now offer e-Visits for anyone 10 and older to request care online for non-urgent symptoms. For details visit [email or web address].   Also download the MyChart app! Go to the app store, search "MyChart", open the app, select [facility], and log in with your MyChart username and password.  Due to Covid, a mask is required upon entering the hospital/clinic. If you do not have a mask, one will be given to you upon arrival. For doctor visits, patients may have 1 support person aged 26 or older with them. For treatment visits, patients cannot have anyone with them due to current Covid guidelines and our immunocompromised population.

## 2021-04-06 NOTE — Progress Notes (Signed)
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  She underwent operative fixation on 10/12/2020.  MRI of femur showed heterogeneously enhancing osseous lesion within the area which would be nonspecific versus office neoplasm or metastatic lesion.  CT chest abdomen and pelvis with contrast showed an enlarged pretracheal lymph node 2.2 x 1.5 cm and a right paratracheal lymph node measuring 2.7 x 1.3 cm.  Prevascular node measuring 0.3 x 0.9 cm.  5 x 4 mm right middle lobe nodule.  2.8 x 1.9 cm left adrenal lesion.    Reamings from the right femur showed metastatic poorly differentiated carcinoma.  Immunohistochemistry showed was positive for pancytokeratin, CK7 and patchy CK20 with patchy dim expression of TTF-1.  Cells negative for Melan-A, CDX2, PAX8, Napsin A, GATA3, p40, CD56, p16 and thyroglobulin.  Findings compatible with metastatic carcinoma but because of decalcification immunohistochemical staining is unreliable.  Patchy dim staining with TTF-1 suspicious for lung primary but not a definitive diagnosis.   Repeat supraclavicular lymph node biopsy showed  metastatic adenocarcinoma.  Tumor cells positive for CK7 with focal weak staining for TTF-1.  Suggestive of lung origin in the proper clinical context.  Cells were negative for GATA3 PAX8 CDX2 and CK20 and Napsin A.  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  She is currently on hydrocortisone twice daily with plans to start levothyroxine soon  Interval history-patient had a left forearm fracture when she was working in her backyard.  That has been fixed surgically.  She presently reports doing well and states that her energy levels are somewhat improved after starting hydrocortisone.  ECOG PS- 1 Pain scale- 0   Review of systems- Review of Systems  Constitutional:  Negative for chills, fever, malaise/fatigue and weight loss.  HENT:  Negative for congestion, ear discharge and nosebleeds.   Eyes:  Negative for blurred vision.  Respiratory:  Negative for cough, hemoptysis, sputum production, shortness of breath and wheezing.   Cardiovascular:  Negative for chest pain, palpitations, orthopnea and claudication.  Gastrointestinal:  Negative for abdominal pain, blood in stool, constipation, diarrhea, heartburn, melena, nausea and vomiting.  Genitourinary:  Negative for dysuria, flank pain, frequency, hematuria and urgency.  Musculoskeletal:  Negative for back pain, joint pain and myalgias.  Skin:  Negative for rash.  Neurological:  Negative for dizziness, tingling, focal weakness, seizures, weakness and headaches.  Endo/Heme/Allergies:  Does not bruise/bleed easily.  Psychiatric/Behavioral:  Negative for depression and suicidal ideas. The patient does not have insomnia.      Allergies  Allergen Reactions   Factive [Gemifloxacin] Rash     Past Medical History:  Diagnosis Date  Abnormal Pap smear of cervix    Anxiety    Depression    Diverticulosis     Essential hypertension    Femur fracture, right (HCC)    GERD (gastroesophageal reflux disease)    Lung cancer (HCC)    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   Palpitations    Precordial chest pain    Wrist fracture 03/2021   left     Past Surgical History:  Procedure Laterality Date   BREAST BIOPSY Right 2017   benign   CERVICAL BIOPSY  W/ LOOP ELECTRODE EXCISION     COLONOSCOPY WITH PROPOFOL N/A 07/03/2015   Procedure: COLONOSCOPY WITH PROPOFOL;  Surgeon: Hulen Luster, MD;  Location: [facility] ENDOSCOPY;  Service: Gastroenterology;  Laterality: N/A;   ESOPHAGOGASTRODUODENOSCOPY (EGD) WITH PROPOFOL N/A 07/03/2015   Procedure: ESOPHAGOGASTRODUODENOSCOPY (EGD) WITH PROPOFOL;  Surgeon: Hulen Luster, MD;  Location: [facility] ENDOSCOPY;  Service: Gastroenterology;  Laterality: N/A;   INTRAMEDULLARY (IM) NAIL INTERTROCHANTERIC Right 09/15/2020   Procedure: INTRAMEDULLARY (IM) NAIL INTERTROCHANTRIC;  Surgeon: Corky Mull, MD;  Location: [facility] ORS;  Service: Orthopedics;  Laterality: Right;   IR IMAGING GUIDED PORT INSERTION  11/28/2020   LEFT HEART CATH AND CORONARY ANGIOGRAPHY N/A 03/28/2017   Procedure: Left Heart Cath and Coronary Angiography;  Surgeon: Lorretta Harp, MD;  Location: [facility] CV LAB;  Service: Cardiovascular;  Laterality: N/A;   ORIF WRIST FRACTURE Left 03/31/2021   Procedure: OPEN REDUCTION INTERNAL FIXATION (ORIF) LEFT DISTAL RADIUS FRACTURE.;  Surgeon: Corky Mull, MD;  Location: [facility] ORS;  Service: Orthopedics;  Laterality: Left;    Social History   Socioeconomic History   Marital status: Married    Spouse name: Gene   Number of children: 2   Years of education: Not on file   Highest education level: Not on file  Occupational History   Not on file  Tobacco Use   Smoking status: Former    Packs/day: 1.00    Years: 30.00    Pack years: 30.00    Types: Cigarettes    Quit date: 09/17/2020    Years since quitting: 0.5   Smokeless tobacco: Never  Vaping Use   Vaping  Use: Never used  Substance and Sexual Activity   Alcohol use: Not Currently    Alcohol/week: 1.0 standard drink    Types: 1 Standard drinks or equivalent per week    Comment: beer occ.   Drug use: No   Sexual activity: Yes    Birth control/protection: Post-menopausal  Other Topics Concern   Not on file  Social History Narrative   Lives in [place] with her husband.  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   Social Determinants of Health   Financial Resource Strain: Not on file  Food Insecurity: Not on file  Transportation Needs: Not on file  Physical Activity: Not on file  Stress: Not on file  Social Connections: Not on file  Intimate Partner Violence: Not on file    Family History  Problem Relation Age of Onset   Diabetes Mother        alive @ 17   Goiter Mother    Heart disease Father        died of MI @ 92   Osteoporosis Maternal Grandmother    Colon cancer Maternal Grandfather    Breast cancer Neg Hx    Ovarian cancer Neg Hx      Current Outpatient Medications:  acetaminophen (TYLENOL) 325 MG tablet, Take 1-2 tablets (325-650 mg total) by mouth every 6 (six) hours as needed for mild pain (pain score 1-3 or temp > 100.5)., Disp: , Rfl:    ALPRAZolam (XANAX) 0.5 MG tablet, TAKE 1 TABLET BY MOUTH DAILY AS NEEDED, Disp: 30 tablet, Rfl: 0   famotidine (PEPCID) 20 MG tablet, Take 20 mg by mouth in the morning., Disp: , Rfl:    folic acid (FOLVITE) 1 MG tablet, Take 1 tablet (1 mg total) by mouth daily., Disp: 90 tablet, Rfl: 1   hydrocortisone (CORTEF) 10 MG tablet, Take 1 tablet (10 mg total) by mouth at bedtime., Disp: 30 tablet, Rfl: 1   hydrocortisone (CORTEF) 20 MG tablet, Take 1 tablet (20 mg total) by mouth in the morning., Disp: 30 tablet, Rfl: 1   lidocaine-prilocaine (EMLA) cream, Apply to affected area once (Patient taking differently: Apply 1 application topically as needed (prior to port being accessed).), Disp:  30 g, Rfl: 3   metoprolol tartrate (LOPRESSOR) 25 MG tablet, Take 25 mg by mouth in the morning., Disp: , Rfl:    ondansetron (ZOFRAN) 4 MG tablet, Take 1 tablet (4 mg total) by mouth every 8 (eight) hours as needed for nausea or vomiting., Disp: 30 tablet, Rfl: 1   oxyCODONE (OXY IR/ROXICODONE) 5 MG immediate release tablet, Take 1-2 tablets (5-10 mg total) by mouth every 6 (six) hours as needed for moderate pain or severe pain., Disp: 20 tablet, Rfl: 0   Pembrolizumab (KEYTRUDA IV), Inject into the vein. Every 3 weeks, Disp: , Rfl:    PEMEtrexed Disodium (ALIMTA IV), Inject into the vein. Every 3 weeks, Disp: , Rfl:    polyethylene glycol (MIRALAX) 17 g packet, Take 17 g by mouth daily as needed for moderate constipation., Disp: 30 each, Rfl: 0   prochlorperazine (COMPAZINE) 10 MG tablet, Take 1 tablet (10 mg total) by mouth every 6 (six) hours as needed (Nausea or vomiting)., Disp: 30 tablet, Rfl: 1   predniSONE (DELTASONE) 10 MG tablet, Take 20 mg by mouth as directed. Take 2 tablets (20 mg) by mouth the day before chemotherapy & take 2 tablets (20 mg) by mouth for 3 days following chemotherapy (Patient not taking: Reported on 04/06/2021), Disp: , Rfl:  No current facility-administered medications for this visit.  Facility-Administered Medications Ordered in Other Visits:    0.9 %  sodium chloride infusion, , Intravenous, Once, Sindy Guadeloupe, MD   heparin lock flush 100 unit/mL, 500 Units, Intravenous, Once, Sindy Guadeloupe, MD   heparin lock flush 100 unit/mL, 500 Units, Intracatheter, Once PRN, Sindy Guadeloupe, MD   palonosetron (ALOXI) injection 0.25 mg, 0.25 mg, Intravenous, Once, Sindy Guadeloupe, MD   pembrolizumab [facility]) 200 mg in sodium chloride 0.9 % 50 mL chemo infusion, 200 mg, Intravenous, Once, Sindy Guadeloupe, MD   PEMEtrexed (ALIMTA) 1,000 mg in sodium chloride 0.9 % 100 mL chemo infusion, 500 mg/m2 (Treatment Plan Recorded), Intravenous, Once, Sindy Guadeloupe, MD   sodium chloride  flush (NS) 0.9 % injection 10 mL, 10 mL, Intravenous, PRN, Sindy Guadeloupe, MD, 10 mL at 03/09/21 0906   sodium chloride flush (NS) 0.9 % injection 10 mL, 10 mL, Intravenous, PRN, Sindy Guadeloupe, MD, 10 mL at 04/06/21 0808   Zoledronic Acid (ZOMETA) IVPB 4 mg, 4 mg, Intravenous, Q28 days, Sindy Guadeloupe, MD, Stopped at 11/21/20 1100  Physical exam:  Vitals:   04/06/21 0834  BP: (!) 124/91  Pulse: 78  Resp: 16  Temp: 97.8 F (36.6 C)  TempSrc: Tympanic  Weight: 206 lb 8 oz (93.7 kg)  Height: 5' 6"  (1.676 m)   Physical Exam Constitutional:      General: She is not in acute distress. HENT:     Head: Normocephalic.  Cardiovascular:     Rate and Rhythm: Normal rate and regular rhythm.     Heart sounds: Normal heart sounds.  Pulmonary:     Effort: Pulmonary effort is normal.     Breath sounds: Normal breath sounds.  Abdominal:     General: Bowel sounds are normal.     Palpations: Abdomen is soft.  Musculoskeletal:     Comments: Left forearm is in an Ace wrap  Skin:    General: Skin is warm and dry.  Neurological:     Mental Status: She is alert and oriented to person, place, and time.     CMP Latest Ref Rng & Units 04/06/2021  Glucose 70 - 99 mg/dL 116(H)  BUN 6 - 20 mg/dL 20  Creatinine 0.44 - 1.00 mg/dL 0.71  Sodium 135 - 145 mmol/L 137  Potassium 3.5 - 5.1 mmol/L 3.5  Chloride 98 - 111 mmol/L 108  CO2 22 - 32 mmol/L 23  Calcium 8.9 - 10.3 mg/dL 8.7(L)  Total Protein 6.5 - 8.1 g/dL 6.7  Total Bilirubin 0.3 - 1.2 mg/dL 0.6  Alkaline Phos 38 - 126 U/L 94  AST 15 - 41 U/L 19  ALT 0 - 44 U/L 23   CBC Latest Ref Rng & Units 04/06/2021  WBC 4.0 - 10.5 K/uL 6.5  Hemoglobin 12.0 - 15.0 g/dL 12.9  Hematocrit 36.0 - 46.0 % 39.6  Platelets 150 - 400 K/uL 319    No images are attached to the encounter.  DG MINI C-ARM IMAGE ONLY  Result Date: 03/31/2021 There is no interpretation for this exam.  This order is for images obtained during a surgical procedure.  Please See  "Surgeries" Tab for more information regarding the procedure.     Assessment and plan- Patient is a 55 y.o. 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  She is s/p 4 cycles of carbo Alimta Keytruda chemotherapy.  She is here for on treatment assessment prior to cycle 4 of maintenance Alimta and Keytruda  Counts okay to proceed with cycle 4 of maintenance Alimta and Keytruda today.  She will also receive Zometa and B12 shot today.  I will see her back in 3 weeks for cycle 5.  She will need a CT chest abdomen and pelvis with contrast and a bone scan prior.  Autoimmune hypophysitis: Currently on hydrocortisone twice daily.  Depending on her TSH and free T4 levels today I will plan to start her on levothyroxine as well.  Neoplasm related pain: Continue as needed oxycodone which she is using sparingly   Visit Diagnosis 1. Primary malignant neoplasm of right lung metastatic to other site [facility])   2. Hypophysitis ([facility])   3. Encounter for antineoplastic chemotherapy   4. Encounter for antineoplastic immunotherapy      Dr. Randa Evens, MD, MPH [facility] at [facility] [phone] 04/06/2021 9:32 AM

## 2021-04-08 ENCOUNTER — Other Ambulatory Visit: Payer: Self-pay | Admitting: Oncology

## 2021-04-10 ENCOUNTER — Other Ambulatory Visit: Payer: Self-pay | Admitting: Oncology

## 2021-04-14 ENCOUNTER — Other Ambulatory Visit: Payer: Self-pay

## 2021-04-14 DIAGNOSIS — C3490 Malignant neoplasm of unspecified part of unspecified bronchus or lung: Secondary | ICD-10-CM

## 2021-04-15 ENCOUNTER — Encounter
Admission: RE | Admit: 2021-04-15 | Discharge: 2021-04-15 | Disposition: A | Discharge status: Home / Self Care | Payer: 59 | Source: Ambulatory Visit | Attending: Oncology | Admitting: Oncology

## 2021-04-15 ENCOUNTER — Ambulatory Visit
Admission: RE | Admit: 2021-04-15 | Discharge: 2021-04-15 | Disposition: A | Discharge status: Home / Self Care | Payer: 59 | Source: Ambulatory Visit | Attending: Oncology | Admitting: Oncology

## 2021-04-15 ENCOUNTER — Other Ambulatory Visit: Payer: Self-pay

## 2021-04-15 DIAGNOSIS — C3491 Malignant neoplasm of unspecified part of right bronchus or lung: Secondary | ICD-10-CM | POA: Insufficient documentation

## 2021-04-15 MED ORDER — TECHNETIUM TC 99M MEDRONATE IV KIT
20.0000 | PACK | Freq: Once | INTRAVENOUS | Status: AC | PRN
  Administered 2021-04-15: 21.77 via INTRAVENOUS

## 2021-04-15 MED ORDER — IOHEXOL 350 MG/ML SOLN
100.0000 mL | Freq: Once | INTRAVENOUS | Status: AC | PRN
  Administered 2021-04-15: 100 mL via INTRAVENOUS

## 2021-04-20 ENCOUNTER — Ambulatory Visit: Payer: 59 | Admitting: Oncology

## 2021-04-20 ENCOUNTER — Ambulatory Visit: Payer: 59

## 2021-04-20 ENCOUNTER — Other Ambulatory Visit: Payer: 59

## 2021-04-22 ENCOUNTER — Other Ambulatory Visit: Payer: Self-pay | Admitting: Oncology

## 2021-04-22 DIAGNOSIS — C3491 Malignant neoplasm of unspecified part of right bronchus or lung: Secondary | ICD-10-CM

## 2021-04-27 ENCOUNTER — Other Ambulatory Visit: Payer: Self-pay

## 2021-04-27 ENCOUNTER — Inpatient Hospital Stay: Payer: 59 | Attending: Oncology

## 2021-04-27 ENCOUNTER — Inpatient Hospital Stay (HOSPITAL_BASED_OUTPATIENT_CLINIC_OR_DEPARTMENT_OTHER): Payer: 59 | Admitting: Oncology

## 2021-04-27 ENCOUNTER — Other Ambulatory Visit: Payer: Self-pay | Admitting: *Deleted

## 2021-04-27 ENCOUNTER — Encounter: Payer: Self-pay | Admitting: Oncology

## 2021-04-27 ENCOUNTER — Inpatient Hospital Stay: Payer: 59

## 2021-04-27 VITALS — BP 115/75 | HR 72 | Temp 96.5°F | Resp 16 | Ht 66.0 in | Wt 205.0 lb

## 2021-04-27 DIAGNOSIS — C3490 Malignant neoplasm of unspecified part of unspecified bronchus or lung: Secondary | ICD-10-CM | POA: Diagnosis not present

## 2021-04-27 DIAGNOSIS — C779 Secondary and unspecified malignant neoplasm of lymph node, unspecified: Secondary | ICD-10-CM | POA: Diagnosis not present

## 2021-04-27 DIAGNOSIS — G893 Neoplasm related pain (acute) (chronic): Secondary | ICD-10-CM | POA: Insufficient documentation

## 2021-04-27 DIAGNOSIS — Z5111 Encounter for antineoplastic chemotherapy: Secondary | ICD-10-CM

## 2021-04-27 DIAGNOSIS — K219 Gastro-esophageal reflux disease without esophagitis: Secondary | ICD-10-CM | POA: Diagnosis not present

## 2021-04-27 DIAGNOSIS — C7951 Secondary malignant neoplasm of bone: Secondary | ICD-10-CM | POA: Insufficient documentation

## 2021-04-27 DIAGNOSIS — E079 Disorder of thyroid, unspecified: Secondary | ICD-10-CM | POA: Diagnosis not present

## 2021-04-27 DIAGNOSIS — E236 Other disorders of pituitary gland: Secondary | ICD-10-CM

## 2021-04-27 DIAGNOSIS — Z87891 Personal history of nicotine dependence: Secondary | ICD-10-CM | POA: Diagnosis not present

## 2021-04-27 DIAGNOSIS — C7970 Secondary malignant neoplasm of unspecified adrenal gland: Secondary | ICD-10-CM | POA: Insufficient documentation

## 2021-04-27 DIAGNOSIS — R5383 Other fatigue: Secondary | ICD-10-CM | POA: Diagnosis not present

## 2021-04-27 DIAGNOSIS — Z800 Family history of malignant neoplasm of digestive organs: Secondary | ICD-10-CM | POA: Insufficient documentation

## 2021-04-27 DIAGNOSIS — C342 Malignant neoplasm of middle lobe, bronchus or lung: Secondary | ICD-10-CM | POA: Diagnosis not present

## 2021-04-27 DIAGNOSIS — K760 Fatty (change of) liver, not elsewhere classified: Secondary | ICD-10-CM | POA: Diagnosis not present

## 2021-04-27 DIAGNOSIS — I700 Atherosclerosis of aorta: Secondary | ICD-10-CM | POA: Diagnosis not present

## 2021-04-27 DIAGNOSIS — Z79899 Other long term (current) drug therapy: Secondary | ICD-10-CM | POA: Diagnosis not present

## 2021-04-27 DIAGNOSIS — N200 Calculus of kidney: Secondary | ICD-10-CM | POA: Insufficient documentation

## 2021-04-27 DIAGNOSIS — Z5112 Encounter for antineoplastic immunotherapy: Secondary | ICD-10-CM

## 2021-04-27 DIAGNOSIS — C3491 Malignant neoplasm of unspecified part of right bronchus or lung: Secondary | ICD-10-CM

## 2021-04-27 DIAGNOSIS — E2740 Unspecified adrenocortical insufficiency: Secondary | ICD-10-CM | POA: Diagnosis not present

## 2021-04-27 DIAGNOSIS — I10 Essential (primary) hypertension: Secondary | ICD-10-CM | POA: Insufficient documentation

## 2021-04-27 LAB — CBC WITH DIFFERENTIAL/PLATELET
Abs Immature Granulocytes: 0.04 10*3/uL (ref 0.00–0.07)
Basophils Absolute: 0.1 10*3/uL (ref 0.0–0.1)
Basophils Relative: 1 %
Eosinophils Absolute: 0.2 10*3/uL (ref 0.0–0.5)
Eosinophils Relative: 3 %
HCT: 41.3 % (ref 36.0–46.0)
Hemoglobin: 13.5 g/dL (ref 12.0–15.0)
Immature Granulocytes: 1 %
Lymphocytes Relative: 26 %
Lymphs Abs: 1.9 10*3/uL (ref 0.7–4.0)
MCH: 30.3 pg (ref 26.0–34.0)
MCHC: 32.7 g/dL (ref 30.0–36.0)
MCV: 92.6 fL (ref 80.0–100.0)
Monocytes Absolute: 0.7 10*3/uL (ref 0.1–1.0)
Monocytes Relative: 10 %
Neutro Abs: 4.3 10*3/uL (ref 1.7–7.7)
Neutrophils Relative %: 59 %
Platelets: 313 10*3/uL (ref 150–400)
RBC: 4.46 MIL/uL (ref 3.87–5.11)
RDW: 13.2 % (ref 11.5–15.5)
WBC: 7.1 10*3/uL (ref 4.0–10.5)
nRBC: 0 % (ref 0.0–0.2)

## 2021-04-27 LAB — COMPREHENSIVE METABOLIC PANEL
ALT: 27 U/L (ref 0–44)
AST: 21 U/L (ref 15–41)
Albumin: 3.7 g/dL (ref 3.5–5.0)
Alkaline Phosphatase: 87 U/L (ref 38–126)
Anion gap: 7 (ref 5–15)
BUN: 18 mg/dL (ref 6–20)
CO2: 26 mmol/L (ref 22–32)
Calcium: 9.1 mg/dL (ref 8.9–10.3)
Chloride: 105 mmol/L (ref 98–111)
Creatinine, Ser: 0.65 mg/dL (ref 0.44–1.00)
GFR, Estimated: >60 mL/min (ref >60)
Glucose, Bld: 126 mg/dL — ABNORMAL HIGH (ref 70–99)
Potassium: 3.8 mmol/L (ref 3.5–5.1)
Sodium: 138 mmol/L (ref 135–145)
Total Bilirubin: 0.7 mg/dL (ref 0.3–1.2)
Total Protein: 6.9 g/dL (ref 6.5–8.1)

## 2021-04-27 MED ORDER — ALPRAZOLAM 0.5 MG PO TABS: 0.5000 mg | ORAL_TABLET | Freq: Every day | ORAL | 0 refills | Status: DC | PRN

## 2021-04-27 MED ORDER — HEPARIN SOD (PORK) LOCK FLUSH 100 UNIT/ML IV SOLN
500.0000 [IU] | Freq: Once | INTRAVENOUS | Status: AC
  Administered 2021-04-27: 500 [IU] via INTRAVENOUS
  Filled 2021-04-27: qty 5

## 2021-04-27 MED ORDER — HEPARIN SOD (PORK) LOCK FLUSH 100 UNIT/ML IV SOLN
500.0000 [IU] | Freq: Once | INTRAVENOUS | Status: DC | PRN
  Filled 2021-04-27: qty 5

## 2021-04-27 MED ORDER — SODIUM CHLORIDE 0.9% FLUSH
10.0000 mL | Freq: Once | INTRAVENOUS | Status: AC
  Administered 2021-04-27: 10 mL via INTRAVENOUS
  Filled 2021-04-27: qty 10

## 2021-04-27 MED ORDER — SODIUM CHLORIDE 0.9 % IV SOLN
Freq: Once | INTRAVENOUS | Status: AC
Start: 2021-04-27 — End: 2021-04-27
  Filled 2021-04-27: qty 250

## 2021-04-27 MED ORDER — SODIUM CHLORIDE 0.9 % IV SOLN
500.0000 mg/m2 | Freq: Once | INTRAVENOUS | Status: AC
  Administered 2021-04-27: 1000 mg via INTRAVENOUS
  Filled 2021-04-27: qty 40

## 2021-04-27 MED ORDER — PALONOSETRON HCL INJECTION 0.25 MG/5ML
0.2500 mg | Freq: Once | INTRAVENOUS | Status: AC
  Administered 2021-04-27: 0.25 mg via INTRAVENOUS
  Filled 2021-04-27: qty 5

## 2021-04-27 MED ORDER — ZOLEDRONIC ACID 4 MG/100ML IV SOLN
4.0000 mg | INTRAVENOUS | Status: DC
  Administered 2021-04-27: 4 mg via INTRAVENOUS
  Filled 2021-04-27: qty 100

## 2021-04-27 MED ORDER — SODIUM CHLORIDE 0.9 % IV SOLN
200.0000 mg | Freq: Once | INTRAVENOUS | Status: AC
  Administered 2021-04-27: 200 mg via INTRAVENOUS
  Filled 2021-04-27: qty 8

## 2021-04-27 MED ORDER — OXYCODONE HCL 5 MG PO TABS: 5.0000 mg | ORAL_TABLET | Freq: Four times a day (QID) | ORAL | 0 refills | Status: DC | PRN

## 2021-04-27 NOTE — Patient Instructions (Signed)
[facility] ONCOLOGY  Discharge Instructions: Thank you for choosing [facility] to provide your oncology and hematology care.  If you have a lab appointment with the [facility], please go directly to the [facility] and check in at the registration area.  Wear comfortable clothing and clothing appropriate for easy access to any Portacath or PICC line.   We strive to give you quality time with your provider. You may need to reschedule your appointment if you arrive late (15 or more minutes).  Arriving late affects you and other patients whose appointments are after yours.  Also, if you miss three or more appointments without notifying the office, you may be dismissed from the clinic at the provider's discretion.      For prescription refill requests, have your pharmacy contact our office and allow 72 hours for refills to be completed.    Today you received the following chemotherapy and/or immunotherapy agents keytruda, Alimta    To help prevent nausea and vomiting after your treatment, we encourage you to take your nausea medication as directed.  BELOW ARE SYMPTOMS THAT SHOULD BE REPORTED IMMEDIATELY: *FEVER GREATER THAN 100.4 F (38 C) OR HIGHER *CHILLS OR SWEATING *NAUSEA AND VOMITING THAT IS NOT CONTROLLED WITH YOUR NAUSEA MEDICATION *UNUSUAL SHORTNESS OF BREATH *UNUSUAL BRUISING OR BLEEDING *URINARY PROBLEMS (pain or burning when urinating, or frequent urination) *BOWEL PROBLEMS (unusual diarrhea, constipation, pain near the anus) TENDERNESS IN MOUTH AND THROAT WITH OR WITHOUT PRESENCE OF ULCERS (sore throat, sores in mouth, or a toothache) UNUSUAL RASH, SWELLING OR PAIN  UNUSUAL VAGINAL DISCHARGE OR ITCHING   Items with * indicate a potential emergency and should be followed up as soon as possible or go to the Emergency Department if any problems should occur.  Please show the CHEMOTHERAPY ALERT CARD or IMMUNOTHERAPY ALERT CARD at  check-in to the Emergency Department and triage nurse.  Should you have questions after your visit or need to cancel or reschedule your appointment, please contact [facility]  [phone] and follow the prompts.  Office hours are 8:00 a.m. to 4:30 p.m. Monday - Friday. Please note that voicemails left after 4:00 p.m. may not be returned until the following business day.  We are closed weekends and major holidays. You have access to a nurse at all times for urgent questions. Please call the main number to the clinic [phone] and follow the prompts.  For any non-urgent questions, you may also contact your provider using MyChart. We now offer e-Visits for anyone 100 and older to request care online for non-urgent symptoms. For details visit [email or web address].   Also download the MyChart app! Go to the app store, search "MyChart", open the app, select [facility], and log in with your MyChart username and password.  Due to Covid, a mask is required upon entering the hospital/clinic. If you do not have a mask, one will be given to you upon arrival. For doctor visits, patients may have 1 support person aged 24 or older with them. For treatment visits, patients cannot have anyone with them due to current Covid guidelines and our immunocompromised population.

## 2021-04-27 NOTE — Progress Notes (Signed)
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  She underwent operative fixation on 10/12/2020.  MRI of femur showed heterogeneously enhancing osseous lesion within the area which would be nonspecific versus office neoplasm or metastatic lesion.  CT chest abdomen and pelvis with contrast showed an enlarged pretracheal lymph node 2.2 x 1.5 cm and a right paratracheal lymph node measuring 2.7 x 1.3 cm.  Prevascular node measuring 0.3 x 0.9 cm.  5 x 4 mm right middle lobe nodule.  2.8 x 1.9 cm left adrenal lesion.    Reamings from the right femur showed metastatic poorly differentiated carcinoma.  Immunohistochemistry showed was positive for pancytokeratin, CK7 and patchy CK20 with patchy dim expression of TTF-1.  Cells negative for Melan-A, CDX2, PAX8, Napsin A, GATA3, p40, CD56, p16 and thyroglobulin.  Findings compatible with metastatic carcinoma but because of decalcification immunohistochemical staining is unreliable.  Patchy dim staining with TTF-1 suspicious for lung primary but not a definitive diagnosis.   Repeat supraclavicular lymph node biopsy showed  metastatic adenocarcinoma.  Tumor cells positive for CK7 with focal weak staining for TTF-1.  Suggestive of lung origin in the proper clinical context.  Cells were negative for GATA3 PAX8 CDX2 and CK20 and Napsin A.  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  She is currently on hydrocortisone twice daily with plans to start levothyroxine soon    Interval history-patient is doing well and tolerating treatments well so far.  Has mild fatigue but denies other complaints at this time.  Left wrist/hand is still in brace.  ECOG PS- 1 Pain scale- 0   Review of systems- Review of Systems  Constitutional:  Negative for chills, fever, malaise/fatigue and weight loss.  HENT:  Negative for congestion, ear discharge and nosebleeds.   Eyes:  Negative for blurred vision.  Respiratory:  Negative for cough, hemoptysis, sputum production, shortness of breath and wheezing.   Cardiovascular:  Negative for chest pain, palpitations, orthopnea and claudication.  Gastrointestinal:  Negative for abdominal pain, blood in stool, constipation, diarrhea, heartburn, melena, nausea and vomiting.  Genitourinary:  Negative for dysuria, flank pain, frequency, hematuria and urgency.  Musculoskeletal:  Negative for back pain, joint pain and myalgias.  Skin:  Negative for rash.  Neurological:  Negative for dizziness, tingling, focal weakness, seizures, weakness and headaches.  Endo/Heme/Allergies:  Does not bruise/bleed easily.  Psychiatric/Behavioral:  Negative for depression and suicidal ideas. The patient does not have insomnia.      Allergies  Allergen Reactions   Factive [Gemifloxacin] Rash     Past Medical History:  Diagnosis Date   Abnormal Pap smear of cervix  Anxiety    Depression    Diverticulosis    Essential hypertension    Femur fracture, right (HCC)    GERD  (gastroesophageal reflux disease)    Lung cancer (HCC)    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   Palpitations    Precordial chest pain    Wrist fracture 03/2021   left     Past Surgical History:  Procedure Laterality Date   BREAST BIOPSY Right 2017   benign   CERVICAL BIOPSY  W/ LOOP ELECTRODE EXCISION     COLONOSCOPY WITH PROPOFOL N/A 07/03/2015   Procedure: COLONOSCOPY WITH PROPOFOL;  Surgeon: Hulen Luster, MD;  Location: [facility] ENDOSCOPY;  Service: Gastroenterology;  Laterality: N/A;   ESOPHAGOGASTRODUODENOSCOPY (EGD) WITH PROPOFOL N/A 07/03/2015   Procedure: ESOPHAGOGASTRODUODENOSCOPY (EGD) WITH PROPOFOL;  Surgeon: Hulen Luster, MD;  Location: [facility] ENDOSCOPY;  Service: Gastroenterology;  Laterality: N/A;   INTRAMEDULLARY (IM) NAIL INTERTROCHANTERIC Right 09/15/2020   Procedure: INTRAMEDULLARY (IM) NAIL INTERTROCHANTRIC;  Surgeon: Corky Mull, MD;  Location: [facility] ORS;  Service: Orthopedics;  Laterality: Right;   IR IMAGING GUIDED PORT INSERTION  11/28/2020   LEFT HEART CATH AND CORONARY ANGIOGRAPHY N/A 03/28/2017   Procedure: Left Heart Cath and Coronary Angiography;  Surgeon: Lorretta Harp, MD;  Location: [place] CV LAB;  Service: Cardiovascular;  Laterality: N/A;   ORIF WRIST FRACTURE Left 03/31/2021   Procedure: OPEN REDUCTION INTERNAL FIXATION (ORIF) LEFT DISTAL RADIUS FRACTURE.;  Surgeon: Corky Mull, MD;  Location: [facility] ORS;  Service: Orthopedics;  Laterality: Left;    Social History   Socioeconomic History   Marital status: Married    Spouse name: Gene   Number of children: 2   Years of education: Not on file   Highest education level: Not on file  Occupational History   Not on file  Tobacco Use   Smoking status: Former    Packs/day: 1.00    Years: 30.00    Pack years: 30.00    Types: Cigarettes    Quit date: 09/17/2020    Years since quitting: 0.6   Smokeless tobacco: Never  Vaping Use   Vaping Use: Never used  Substance and Sexual Activity   Alcohol use: Not  Currently    Alcohol/week: 1.0 standard drink    Types: 1 Standard drinks or equivalent per week    Comment: beer occ.   Drug use: No   Sexual activity: Yes    Birth control/protection: Post-menopausal  Other Topics Concern   Not on file  Social History Narrative   Lives in [place] with her husband.  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   Social Determinants of Health   Financial Resource Strain: Not on file  Food Insecurity: Not on file  Transportation Needs: Not on file  Physical Activity: Not on file  Stress: Not on file  Social Connections: Not on file  Intimate Partner Violence: Not on file    Family History  Problem Relation Age of Onset   Diabetes Mother        alive @ 57   Goiter Mother    Heart disease Father        died of MI @ 50   Osteoporosis Maternal Grandmother    Colon cancer Maternal Grandfather    Breast cancer Neg Hx    Ovarian cancer Neg Hx      Current Outpatient Medications:    acetaminophen (TYLENOL) 325 MG tablet, Take 1-2  tablets (325-650 mg total) by mouth every 6 (six) hours as needed for mild pain (pain score 1-3 or temp > 100.5)., Disp: , Rfl:    famotidine (PEPCID) 20 MG tablet, Take 20 mg by mouth in the morning., Disp: , Rfl:    folic acid (FOLVITE) 1 MG tablet, Take 1 tablet (1 mg total) by mouth daily., Disp: 90 tablet, Rfl: 1   hydrocortisone (CORTEF) 10 MG tablet, Take 1 tablet (10 mg total) by mouth at bedtime., Disp: 30 tablet, Rfl: 1   hydrocortisone (CORTEF) 20 MG tablet, TAKE ONE TABLET EVERY MORNING, Disp: 30 tablet, Rfl: 1   lidocaine-prilocaine (EMLA) cream, Apply to affected area once (Patient taking differently: Apply 1 application topically as needed (prior to port being accessed).), Disp: 30 g, Rfl: 3   metoprolol tartrate (LOPRESSOR) 25 MG tablet, Take 25 mg by mouth in the morning., Disp: , Rfl:    Pembrolizumab (KEYTRUDA IV), Inject into the vein. Every 3 weeks, Disp: ,  Rfl:    PEMEtrexed Disodium (ALIMTA IV), Inject into the vein. Every 3 weeks, Disp: , Rfl:    ALPRAZolam (XANAX) 0.5 MG tablet, Take 1 tablet (0.5 mg total) by mouth daily as needed., Disp: 30 tablet, Rfl: 0   ondansetron (ZOFRAN) 4 MG tablet, Take 1 tablet (4 mg total) by mouth every 8 (eight) hours as needed for nausea or vomiting. (Patient not taking: Reported on 04/27/2021), Disp: 30 tablet, Rfl: 1   oxyCODONE (OXY IR/ROXICODONE) 5 MG immediate release tablet, Take 1-2 tablets (5-10 mg total) by mouth every 6 (six) hours as needed for moderate pain or severe pain., Disp: 20 tablet, Rfl: 0   polyethylene glycol (MIRALAX) 17 g packet, Take 17 g by mouth daily as needed for moderate constipation. (Patient not taking: Reported on 04/27/2021), Disp: 30 each, Rfl: 0   predniSONE (DELTASONE) 10 MG tablet, Take 20 mg by mouth as directed. Take 2 tablets (20 mg) by mouth the day before chemotherapy & take 2 tablets (20 mg) by mouth for 3 days following chemotherapy (Patient not taking: No sig reported), Disp: , Rfl:    prochlorperazine (COMPAZINE) 10 MG tablet, Take 1 tablet (10 mg total) by mouth every 6 (six) hours as needed (Nausea or vomiting). (Patient not taking: Reported on 04/27/2021), Disp: 30 tablet, Rfl: 1 No current facility-administered medications for this visit.  Facility-Administered Medications Ordered in Other Visits:    heparin lock flush 100 unit/mL, 500 Units, Intracatheter, Once PRN, Sindy Guadeloupe, MD   sodium chloride flush (NS) 0.9 % injection 10 mL, 10 mL, Intravenous, PRN, Sindy Guadeloupe, MD, 10 mL at 03/09/21 0906   Zoledronic Acid (ZOMETA) IVPB 4 mg, 4 mg, Intravenous, Q28 days, Sindy Guadeloupe, MD, Stopped at 11/21/20 1100   Zoledronic Acid (ZOMETA) IVPB 4 mg, 4 mg, Intravenous, Q28 days, Sindy Guadeloupe, MD, Stopped at 04/27/21 1107  Physical exam:  Vitals:   04/27/21 0954  BP: 115/75  Pulse: 72  Resp: 16  Temp: (!) 96.5 F (35.8 C)  TempSrc: Tympanic  Weight: 205 lb (93  kg)  Height: 5' 6"  (1.676 m)   Physical Exam Constitutional:      General: She is not in acute distress. Cardiovascular:     Rate and Rhythm: Normal rate and regular rhythm.     Heart sounds: Normal heart sounds.  Pulmonary:     Effort: Pulmonary effort is normal.     Breath sounds: Normal breath sounds.  Abdominal:     General:  Bowel sounds are normal.     Palpations: Abdomen is soft.  Skin:    General: Skin is warm and dry.  Neurological:     Mental Status: She is alert and oriented to person, place, and time.     CMP Latest Ref Rng & Units 04/27/2021  Glucose 70 - 99 mg/dL 126(H)  BUN 6 - 20 mg/dL 18  Creatinine 0.44 - 1.00 mg/dL 0.65  Sodium 135 - 145 mmol/L 138  Potassium 3.5 - 5.1 mmol/L 3.8  Chloride 98 - 111 mmol/L 105  CO2 22 - 32 mmol/L 26  Calcium 8.9 - 10.3 mg/dL 9.1  Total Protein 6.5 - 8.1 g/dL 6.9  Total Bilirubin 0.3 - 1.2 mg/dL 0.7  Alkaline Phos 38 - 126 U/L 87  AST 15 - 41 U/L 21  ALT 0 - 44 U/L 27   CBC Latest Ref Rng & Units 04/27/2021  WBC 4.0 - 10.5 K/uL 7.1  Hemoglobin 12.0 - 15.0 g/dL 13.5  Hematocrit 36.0 - 46.0 % 41.3  Platelets 150 - 400 K/uL 313    No images are attached to the encounter.  NM Bone Scan Whole Body  Result Date: 04/15/2021 CLINICAL DATA:  Lung cancer with osseous metastatic disease and pathologic fracture of the right femur, on chemotherapy. EXAM: NUCLEAR MEDICINE WHOLE BODY BONE SCAN TECHNIQUE: Whole body anterior and posterior images were obtained approximately 3 hours after intravenous injection of radiopharmaceutical. RADIOPHARMACEUTICALS:  21.8 mCi Technetium-3mMDP IV COMPARISON:  Same day CT chest abdomen pelvis and nuclear medicine bone scan Jan 16, 2021 FINDINGS: Interval decrease in the abnormal radiotracer uptake in the right femur, lateral left iliac crest and right skull. No new abnormal osseous radiotracer uptake to suggest new osteoblastic metastatic disease. Relative photopenia in the central right femur  associated with the intramedullary fixation rod. Multifocal radiotracer uptake involving the shoulders, sternoclavicular joints, spine, wrists, hands, knees and feet in a pattern most consistent with degenerative arthropathy. Otherwise physiologic distribution of radiotracer activity. IMPRESSION: Decreased radiotracer uptake associated with the metastatic osseous lesions. No scintigraphic findings to suggest new or progressive osteoblastic metastatic disease. Electronically Signed   By: JDahlia BailiffMD   On: 04/15/2021 15:14   CT CHEST ABDOMEN PELVIS W CONTRAST  Result Date: 04/15/2021 CLINICAL DATA:  Lung cancer on chemotherapy. EXAM: CT CHEST, ABDOMEN, AND PELVIS WITH CONTRAST TECHNIQUE: Multidetector CT imaging of the chest, abdomen and pelvis was performed following the standard protocol during bolus administration of intravenous contrast. CONTRAST:  1023mOMNIPAQUE IOHEXOL 350 MG/ML SOLN COMPARISON:  Multiple priors including CT and nuclear medicine bone scan Jan 16, 2021 FINDINGS: CT CHEST FINDINGS Cardiovascular: Right chest wall port with tip in the right atrium. Normal size heart. No significant pericardial effusion/thickening. Normal caliber thoracic aorta and central pulmonary arteries. No central pulmonary embolus. Mediastinum/Nodes: Low-attenuation lesions in the right thyroid measuring up to 12 mm are unchanged. Not clinically significant; no follow-up imaging recommended (ref: J Am Coll Radiol. 2015 Feb;12(2): 143-50).High right paratracheal lymph node has decreased in size measuring 7 mm on image 17/3 previously 9 mm. The low paratracheal and AP window lymph nodes are also decreased to similar in size. Previously indexed low right paratracheal lymph node measures 8 mm on image 19/3, previously 9 mm. No new or progressive mediastinal, hilar or axillary adenopathy. Lungs/Pleura: Pulmonary nodule in the lateral segment of the right middle lobe now measures 2-3 mm on image 90/4 previously 4 mm. No  new suspicious pulmonary nodules or masses. No focal airspace  consolidation. No pleural effusion. No pneumothorax. Musculoskeletal: Degenerative changes in the spine. Remote left rib fracture. No aggressive lytic or blastic lesion of bone. CT ABDOMEN PELVIS FINDINGS Hepatobiliary: Hepatic steatosis. Stable 12 mm low-attenuation lesion in the dome of the liver, too small to accurately characterize. No solid enhancing hepatic lesion. Gallbladder is unremarkable. No biliary ductal dilation. Pancreas: Within normal limits. Spleen: Within normal limits. Adrenals/Urinary Tract: Decreased size of the left adrenal metastasis which now measures 2.5 x 0.9 cm on image 61/3 previously 2.5 x 1.3 cm. Right adrenal glands unremarkable. Small nonobstructive right renal stone. Subcentimeter hypoattenuating lesions in the left kidney are too small to accurately characterize but statistically likely to represent a cyst. No hydronephrosis. Urinary bladder is unremarkable for degree of distension. Stomach/Bowel: Stomach is within normal limits. Appendix appears normal. No evidence of bowel wall thickening, distention, or inflammatory changes. Colonic diverticulosis without findings of acute diverticulitis. Vascular/Lymphatic: Aortic atherosclerosis without aneurysmal dilation. No pathologically enlarged abdominal or pelvic lymph nodes. Reproductive: Uterus and bilateral adnexa are unremarkable. Other: No abdominopelvic ascites. Musculoskeletal: Slightly increased sclerosis associated with the metastatic lesion in the anterior left iliac wing and right femur. Partially visualized intramedullary rod and dynamic hip screw fixation of the pathologic left femur fracture. Similar appearance of the left hemi sacrum with area of lucency and sclerosis which was not hypermetabolic on prior PET-CT from October 02, 2020 and did not demonstrate abnormal increased radiotracer uptake on bone scan Jan 16, 2021 as well it is unchanged in appearance  dating back to at least June 20, 2015 and is consistent with a benign entity. No new aggressive lytic or blastic lesions of bone. IMPRESSION: 1. Slight interval decrease in size of mediastinal lymph nodes and left adrenal metastasis. 2. Slightly increased sclerosis associated with the metastatic lesion in the anterior left iliac wing and right femur. 3. Slight interval decrease in size of the right middle lobe pulmonary nodule. 4. No evidence of new or progressive disease in the chest abdomen or pelvis. 5. Hepatic steatosis. 6. Nonobstructing right nephrolithiasis. 7. Colonic diverticulosis without findings of acute diverticulitis. 8.  Aortic Atherosclerosis (ICD10-I70.0). Electronically Signed   By: Dahlia Bailiff MD   On: 04/15/2021 15:06   DG MINI C-ARM IMAGE ONLY  Result Date: 03/31/2021 There is no interpretation for this exam.  This order is for images obtained during a surgical procedure.  Please See "Surgeries" Tab for more information regarding the procedure.     Assessment and plan- Patient is a 55 y.o. female  stage IV adenocarcinoma of the lung with metastases to bone and adrenal gland.  She is s/p 4 cycles of carbo Alimta Keytruda chemotherapy.  She is here for on treatment assessment prior to cycle 5 of maintenance Alimta and Keytruda.  Counts okay to proceed with cycle 5 of maintenance Alimta and Keytruda today.  Patient will also receive Zometa today.  I will see her back in 4 weeks for cycle 6 as she is out of town for week 3.  She will also get Zometa on that day.  Neoplasm related pain: Continue as needed oxycodone  Autoimmune hypophysitis: On hydrocortisone twice daily.  Repeat TSH in 3 weeks.  So far she is not on levothyroxine.  Her TSH was low but T4 was high likely indicating that she was more in the face of thyroiditis preceding hypothyroidism     Visit Diagnosis 1. Primary malignant neoplasm of lung metastatic to other site, unspecified laterality [facility])      Dr.  Randa Evens, MD, MPH [place] [facility] [phone] 04/27/2021 4:12 PM

## 2021-04-28 ENCOUNTER — Other Ambulatory Visit: Payer: Self-pay | Admitting: Oncology

## 2021-05-05 ENCOUNTER — Other Ambulatory Visit: Payer: Self-pay | Admitting: *Deleted

## 2021-05-05 NOTE — Telephone Encounter (Signed)
The refill I sen tto her drug store was a print version and I did not know about that. I told Janese Banks and she sent refill in for the pt.

## 2021-05-11 ENCOUNTER — Other Ambulatory Visit: Payer: Self-pay | Admitting: Oncology

## 2021-05-25 ENCOUNTER — Inpatient Hospital Stay: Payer: 59 | Attending: Oncology

## 2021-05-25 ENCOUNTER — Encounter: Payer: Self-pay | Admitting: Oncology

## 2021-05-25 ENCOUNTER — Other Ambulatory Visit: Payer: Self-pay

## 2021-05-25 ENCOUNTER — Inpatient Hospital Stay: Payer: 59

## 2021-05-25 ENCOUNTER — Inpatient Hospital Stay (HOSPITAL_BASED_OUTPATIENT_CLINIC_OR_DEPARTMENT_OTHER): Payer: 59 | Admitting: Oncology

## 2021-05-25 VITALS — BP 128/94 | HR 73 | Temp 97.8°F | Resp 16 | Ht 66.0 in | Wt 212.0 lb

## 2021-05-25 DIAGNOSIS — Z5112 Encounter for antineoplastic immunotherapy: Secondary | ICD-10-CM | POA: Diagnosis not present

## 2021-05-25 DIAGNOSIS — Z87891 Personal history of nicotine dependence: Secondary | ICD-10-CM | POA: Diagnosis not present

## 2021-05-25 DIAGNOSIS — Z79899 Other long term (current) drug therapy: Secondary | ICD-10-CM | POA: Diagnosis not present

## 2021-05-25 DIAGNOSIS — C7951 Secondary malignant neoplasm of bone: Secondary | ICD-10-CM | POA: Diagnosis not present

## 2021-05-25 DIAGNOSIS — G893 Neoplasm related pain (acute) (chronic): Secondary | ICD-10-CM

## 2021-05-25 DIAGNOSIS — M79651 Pain in right thigh: Secondary | ICD-10-CM | POA: Insufficient documentation

## 2021-05-25 DIAGNOSIS — C7970 Secondary malignant neoplasm of unspecified adrenal gland: Secondary | ICD-10-CM | POA: Diagnosis not present

## 2021-05-25 DIAGNOSIS — M25551 Pain in right hip: Secondary | ICD-10-CM | POA: Diagnosis not present

## 2021-05-25 DIAGNOSIS — I10 Essential (primary) hypertension: Secondary | ICD-10-CM | POA: Insufficient documentation

## 2021-05-25 DIAGNOSIS — Z5111 Encounter for antineoplastic chemotherapy: Secondary | ICD-10-CM

## 2021-05-25 DIAGNOSIS — K219 Gastro-esophageal reflux disease without esophagitis: Secondary | ICD-10-CM | POA: Diagnosis not present

## 2021-05-25 DIAGNOSIS — E2740 Unspecified adrenocortical insufficiency: Secondary | ICD-10-CM | POA: Diagnosis not present

## 2021-05-25 DIAGNOSIS — C3491 Malignant neoplasm of unspecified part of right bronchus or lung: Secondary | ICD-10-CM

## 2021-05-25 DIAGNOSIS — E236 Other disorders of pituitary gland: Secondary | ICD-10-CM | POA: Diagnosis not present

## 2021-05-25 DIAGNOSIS — E785 Hyperlipidemia, unspecified: Secondary | ICD-10-CM | POA: Insufficient documentation

## 2021-05-25 DIAGNOSIS — C3490 Malignant neoplasm of unspecified part of unspecified bronchus or lung: Secondary | ICD-10-CM

## 2021-05-25 LAB — COMPREHENSIVE METABOLIC PANEL
ALT: 31 U/L (ref 0–44)
AST: 27 U/L (ref 15–41)
Albumin: 3.7 g/dL (ref 3.5–5.0)
Alkaline Phosphatase: 83 U/L (ref 38–126)
Anion gap: 8 (ref 5–15)
BUN: 16 mg/dL (ref 6–20)
CO2: 24 mmol/L (ref 22–32)
Calcium: 8.8 mg/dL — ABNORMAL LOW (ref 8.9–10.3)
Chloride: 106 mmol/L (ref 98–111)
Creatinine, Ser: 0.77 mg/dL (ref 0.44–1.00)
GFR, Estimated: >60 mL/min (ref >60)
Glucose, Bld: 125 mg/dL — ABNORMAL HIGH (ref 70–99)
Potassium: 3.6 mmol/L (ref 3.5–5.1)
Sodium: 138 mmol/L (ref 135–145)
Total Bilirubin: 0.7 mg/dL (ref 0.3–1.2)
Total Protein: 6.8 g/dL (ref 6.5–8.1)

## 2021-05-25 LAB — CBC WITH DIFFERENTIAL/PLATELET
Abs Immature Granulocytes: 0.02 10*3/uL (ref 0.00–0.07)
Basophils Absolute: 0.1 10*3/uL (ref 0.0–0.1)
Basophils Relative: 1 %
Eosinophils Absolute: 0.2 10*3/uL (ref 0.0–0.5)
Eosinophils Relative: 2 %
HCT: 41.8 % (ref 36.0–46.0)
Hemoglobin: 13.6 g/dL (ref 12.0–15.0)
Immature Granulocytes: 0 %
Lymphocytes Relative: 32 %
Lymphs Abs: 2.3 10*3/uL (ref 0.7–4.0)
MCH: 29.6 pg (ref 26.0–34.0)
MCHC: 32.5 g/dL (ref 30.0–36.0)
MCV: 90.9 fL (ref 80.0–100.0)
Monocytes Absolute: 0.7 10*3/uL (ref 0.1–1.0)
Monocytes Relative: 10 %
Neutro Abs: 4 10*3/uL (ref 1.7–7.7)
Neutrophils Relative %: 55 %
Platelets: 264 10*3/uL (ref 150–400)
RBC: 4.6 MIL/uL (ref 3.87–5.11)
RDW: 13.8 % (ref 11.5–15.5)
WBC: 7.2 10*3/uL (ref 4.0–10.5)
nRBC: 0 % (ref 0.0–0.2)

## 2021-05-25 LAB — TSH: TSH: 2.025 u[IU]/mL (ref 0.350–4.500)

## 2021-05-25 MED ORDER — SODIUM CHLORIDE 0.9% FLUSH
10.0000 mL | INTRAVENOUS | Status: DC | PRN
  Administered 2021-05-25: 10 mL via INTRAVENOUS
  Filled 2021-05-25: qty 10

## 2021-05-25 MED ORDER — SODIUM CHLORIDE 0.9 % IV SOLN
200.0000 mg | Freq: Once | INTRAVENOUS | Status: AC
  Administered 2021-05-25: 200 mg via INTRAVENOUS
  Filled 2021-05-25: qty 8

## 2021-05-25 MED ORDER — PALONOSETRON HCL INJECTION 0.25 MG/5ML
0.2500 mg | Freq: Once | INTRAVENOUS | Status: AC
  Administered 2021-05-25: 0.25 mg via INTRAVENOUS
  Filled 2021-05-25: qty 5

## 2021-05-25 MED ORDER — ZOLEDRONIC ACID 4 MG/100ML IV SOLN
4.0000 mg | INTRAVENOUS | Status: DC
  Administered 2021-05-25: 4 mg via INTRAVENOUS
  Filled 2021-05-25: qty 100

## 2021-05-25 MED ORDER — SODIUM CHLORIDE 0.9 % IV SOLN
500.0000 mg/m2 | Freq: Once | INTRAVENOUS | Status: AC
  Administered 2021-05-25: 1000 mg via INTRAVENOUS
  Filled 2021-05-25: qty 40

## 2021-05-25 MED ORDER — SODIUM CHLORIDE 0.9 % IV SOLN
Freq: Once | INTRAVENOUS | Status: AC
  Filled 2021-05-25: qty 250

## 2021-05-25 MED ORDER — HEPARIN SOD (PORK) LOCK FLUSH 100 UNIT/ML IV SOLN
INTRAVENOUS | Status: AC
  Administered 2021-05-25: 500 [IU]
  Filled 2021-05-25: qty 5

## 2021-05-25 MED ORDER — HEPARIN SOD (PORK) LOCK FLUSH 100 UNIT/ML IV SOLN
500.0000 [IU] | Freq: Once | INTRAVENOUS | Status: DC
  Filled 2021-05-25: qty 5

## 2021-05-25 MED ORDER — HEPARIN SOD (PORK) LOCK FLUSH 100 UNIT/ML IV SOLN
500.0000 [IU] | Freq: Once | INTRAVENOUS | Status: AC | PRN
  Filled 2021-05-25: qty 5

## 2021-05-25 NOTE — Progress Notes (Signed)
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  She underwent operative fixation on 10/12/2020.  MRI of femur showed heterogeneously enhancing osseous lesion within the area which would be nonspecific versus office neoplasm or metastatic lesion.  CT chest abdomen and pelvis with contrast showed an enlarged pretracheal lymph node 2.2 x 1.5 cm and a right paratracheal lymph node measuring 2.7 x 1.3 cm.  Prevascular node measuring 0.3 x 0.9 cm.  5 x 4 mm right middle lobe nodule.  2.8 x 1.9 cm left adrenal lesion.    Reamings from the right femur showed metastatic poorly differentiated carcinoma.  Immunohistochemistry showed was positive for pancytokeratin, CK7 and patchy CK20 with patchy dim expression of TTF-1.  Cells negative for Melan-A, CDX2, PAX8, Napsin A, GATA3, p40, CD56, p16 and thyroglobulin.  Findings compatible with metastatic carcinoma but because of decalcification immunohistochemical staining is unreliable.  Patchy dim staining with TTF-1 suspicious for lung primary but not a definitive diagnosis.   Repeat supraclavicular lymph node biopsy showed  metastatic adenocarcinoma.  Tumor cells positive for CK7 with focal weak staining for TTF-1.  Suggestive of lung origin in the proper clinical context.  Cells were negative for GATA3 PAX8 CDX2 and CK20 and Napsin A.  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  She is currently on hydrocortisone twice daily.  Thyroid functions presently are normal  Interval history-patient is tolerating treatment well so far.  Denies any specific complaints at this time.  Right hip pain is stable.  She has some baseline fatigue but is able to cope up with part-time work.  She also went for 1 week of camping last week.  ECOG PS- 1 Pain scale- 0 Opioid associated constipation- no  Review of systems- Review of Systems  Constitutional:  Positive for malaise/fatigue.  Musculoskeletal:  Positive for joint pain (Right hip pain).     Allergies  Allergen Reactions   Factive [Gemifloxacin] Rash     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   Palpitations    Precordial chest pain    Wrist fracture 03/2021   left     Past Surgical History:  Procedure Laterality Date   BREAST BIOPSY Right 2017   benign   CERVICAL BIOPSY  W/ LOOP ELECTRODE EXCISION     COLONOSCOPY WITH PROPOFOL N/A  07/03/2015   Procedure: COLONOSCOPY WITH PROPOFOL;  Surgeon: Hulen Luster, MD;  [facility] END[facility]COPY;  Service: Gastroenterology;  Laterality: N/A;   ESOPHAGOGASTRODUODENOSCOPY (EGD) WITH PROPOFOL N/A 07/03/2015   Procedure: ESOPHAGOGASTRODUODENOSCOPY (EGD) WITH PROPOFOL;  Surgeon: Hulen Luster, MD;  Location: [facility] ENDOSCOPY;  Service: Gastroenterology;  Laterality:  N/A;   INTRAMEDULLARY (IM) NAIL INTERTROCHANTERIC Right 09/15/2020   Procedure: INTRAMEDULLARY (IM) NAIL INTERTROCHANTRIC;  Surgeon: Corky Mull, MD;  Location: [facility] ORS;  Service: Orthopedics;  Laterality: Right;   IR IMAGING GUIDED PORT INSERTION  11/28/2020   LEFT HEART CATH AND CORONARY ANGIOGRAPHY N/A 03/28/2017   Procedure: Left Heart Cath and Coronary Angiography;  Surgeon: Lorretta Harp, MD;  Location: [place] CV LAB;  Service: Cardiovascular;  Laterality: N/A;   ORIF WRIST FRACTURE Left 03/31/2021   Procedure: OPEN REDUCTION INTERNAL FIXATION (ORIF) LEFT DISTAL RADIUS FRACTURE.;  Surgeon: Corky Mull, MD;  Location: [facility] ORS;  Service: Orthopedics;  Laterality: Left;    Social History   Socioeconomic History   Marital status: Married    Spouse name: Gene   Number of children: 2   Years of education: Not on file   Highest education level: Not on file  Occupational History   Not on file  Tobacco Use   Smoking status: Former    Packs/day: 1.00    Years: 30.00    Pack years: 30.00    Types: Cigarettes    Quit date: 09/17/2020    Years since quitting: 0.6   Smokeless tobacco: Never  Vaping Use   Vaping Use: Never used  Substance and Sexual Activity   Alcohol use: Not Currently    Alcohol/week: 1.0 standard drink    Types: 1 Standard drinks or equivalent per week    Comment: beer occ.   Drug use: No   Sexual activity: Yes    Birth control/protection: Post-menopausal  Other Topics Concern   Not on file  Social History Narrative   Lives in [place] with her husband.  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   Social Determinants of Health   Financial Resource Strain: Not on file  Food Insecurity: Not on file  Transportation Needs: Not on file  Physical Activity: Not on file  Stress: Not on file  Social Connections: Not on file  Intimate Partner Violence: Not on file    Family History  Problem Relation Age of  Onset   Diabetes Mother        alive @ 36   Goiter Mother    Heart disease Father        died of MI @ 65   Osteoporosis Maternal Grandmother    Colon cancer Maternal Grandfather    Breast cancer Neg Hx    Ovarian cancer Neg Hx      Current Outpatient Medications:    acetaminophen (TYLENOL) 325 MG tablet, Take 1-2 tablets (325-650 mg total) by mouth every 6 (six) hours as needed for mild pain (pain score 1-3 or temp > 100.5)., Disp: , Rfl:    ALPRAZolam (XANAX) 0.5 MG tablet, Take 1 tablet (0.5 mg total) by mouth daily as needed., Disp: 30 tablet, Rfl: 0   famotidine (PEPCID) 20 MG tablet, Take 20 mg by mouth in the morning., Disp: , Rfl:    folic acid (FOLVITE) 1 MG tablet, Take 1 tablet (1 mg total) by mouth daily., Disp: 90 tablet, Rfl: 1   hydrocortisone (CORTEF) 10 MG tablet, TAKE  ONE TABLET AT BEDTIME, Disp: 30 tablet, Rfl: 1   hydrocortisone (CORTEF) 20 MG tablet, TAKE ONE TABLET EVERY MORNING, Disp: 30 tablet, Rfl: 1   lidocaine-prilocaine (EMLA) cream, Apply to affected area once (Patient taking differently: Apply 1 application topically as needed (prior to port being accessed).), Disp: 30 g, Rfl: 3   metoprolol tartrate (LOPRESSOR) 25 MG tablet, Take 25 mg by mouth in the morning., Disp: , Rfl:    ondansetron (ZOFRAN) 4 MG tablet, Take 1 tablet (4 mg total) by mouth every 8 (eight) hours as needed for nausea or vomiting., Disp: 30 tablet, Rfl: 1   oxyCODONE (OXY IR/ROXICODONE) 5 MG immediate release tablet, TAKE 1 TO 2 TABLETS BY MOUTH EVERY 4 HOURS AS NEEDED FOR MODERATE PAIN, Disp: 60 tablet, Rfl: 0   Pembrolizumab (KEYTRUDA IV), Inject into the vein. Every 3 weeks, Disp: , Rfl:    PEMEtrexed Disodium (ALIMTA IV), Inject into the vein. Every 3 weeks, Disp: , Rfl:    polyethylene glycol (MIRALAX) 17 g packet, Take 17 g by mouth daily as needed for moderate constipation., Disp: 30 each, Rfl: 0   prochlorperazine (COMPAZINE) 10 MG tablet, Take 1 tablet (10 mg total) by mouth every  6 (six) hours as needed (Nausea or vomiting)., Disp: 30 tablet, Rfl: 1 No current facility-administered medications for this visit.  Facility-Administered Medications Ordered in Other Visits:    heparin lock flush 100 UNIT/ML injection, , , ,    heparin lock flush 100 unit/mL, 500 Units, Intracatheter, Once PRN, Sindy Guadeloupe, MD   palonosetron (ALOXI) injection 0.25 mg, 0.25 mg, Intravenous, Once, Sindy Guadeloupe, MD   pembrolizumab [facility]) 200 mg in sodium chloride 0.9 % 50 mL chemo infusion, 200 mg, Intravenous, Once, Sindy Guadeloupe, MD   PEMEtrexed (ALIMTA) 1,000 mg in sodium chloride 0.9 % 100 mL chemo infusion, 500 mg/m2 (Treatment Plan Recorded), Intravenous, Once, Sindy Guadeloupe, MD   sodium chloride flush (NS) 0.9 % injection 10 mL, 10 mL, Intravenous, PRN, Sindy Guadeloupe, MD, 10 mL at 03/09/21 0906   Zoledronic Acid (ZOMETA) IVPB 4 mg, 4 mg, Intravenous, Q28 days, Sindy Guadeloupe, MD, Stopped at 11/21/20 1100   Zoledronic Acid (ZOMETA) IVPB 4 mg, 4 mg, Intravenous, Q28 days, Sindy Guadeloupe, MD, Last Rate: 400 mL/hr at 05/25/21 0946, 4 mg at 05/25/21 0946  Physical exam:  Vitals:   05/25/21 0902  BP: (!) 128/94  Pulse: 73  Resp: 16  Temp: 97.8 F (36.6 C)  TempSrc: Tympanic  Weight: 212 lb (96.2 kg)  Height: 5' 6" (1.676 m)   Physical Exam Constitutional:      General: She is not in acute distress. Cardiovascular:     Rate and Rhythm: Normal rate and regular rhythm.     Heart sounds: Normal heart sounds.  Pulmonary:     Effort: Pulmonary effort is normal.     Breath sounds: Normal breath sounds.  Skin:    General: Skin is warm and dry.  Neurological:     Mental Status: She is alert and oriented to person, place, and time.     CMP Latest Ref Rng & Units 05/25/2021  Glucose 70 - 99 mg/dL 125(H)  BUN 6 - 20 mg/dL 16  Creatinine 0.44 - 1.00 mg/dL 0.77  Sodium 135 - 145 mmol/L 138  Potassium 3.5 - 5.1 mmol/L 3.6  Chloride 98 - 111 mmol/L 106  CO2 22 - 32 mmol/L 24   Calcium 8.9 - 10.3 mg/dL 8.8(L)  Total Protein 6.5 - 8.1 g/dL 6.8  Total Bilirubin 0.3 - 1.2 mg/dL 0.7  Alkaline Phos 38 - 126 U/L 83  AST 15 - 41 U/L 27  ALT 0 - 44 U/L 31   CBC Latest Ref Rng & Units 05/25/2021  WBC 4.0 - 10.5 K/uL 7.2  Hemoglobin 12.0 - 15.0 g/dL 13.6  Hematocrit 36.0 - 46.0 % 41.8  Platelets 150 - 400 K/uL 264     Assessment and plan- Patient is a 55 y.o. 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  Counts okay to proceed with cycle 6 of maintenance Alimta and Keytruda today.  She will also receive Zometa today.  B12 shot at next visit.  I will see her back in 3 weeks for cycle 7.  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  Clinically patient is euthyroid and TSH is normal.  Hold off on starting levothyroxine at this time.  Repeat TSH in 6 weeks.  Neoplasm related pain: Continue as needed oxycodone   Visit Diagnosis 1. Encounter for antineoplastic chemotherapy   2. Encounter for antineoplastic immunotherapy   3. Primary malignant neoplasm of right lung metastatic to other site ([place])   4. Hypophysitis ([place])   5. Neoplasm related pain      Dr. Randa Evens, MD, MPH [facility] at [facility] [phone] 05/25/2021 10:06 AM

## 2021-05-25 NOTE — Progress Notes (Signed)
Pt has no concerns

## 2021-05-25 NOTE — Patient Instructions (Signed)
[place] ONCOLOGY   Discharge Instructions: Thank you for choosing [facility] to provide your oncology and hematology care.  If you have a lab appointment with the [facility], please go directly to the [facility] and check in at the registration area.  Wear comfortable clothing and clothing appropriate for easy access to any Portacath or PICC line.   We strive to give you quality time with your provider. You may need to reschedule your appointment if you arrive late (15 or more minutes).  Arriving late affects you and other patients whose appointments are after yours.  Also, if you miss three or more appointments without notifying the office, you may be dismissed from the clinic at the provider's discretion.      For prescription refill requests, have your pharmacy contact our office and allow 72 hours for refills to be completed.    Today you received the following chemotherapy and/or immunotherapy agents: Keytruda and Alimta. Today you also received the following: Zometa.      To help prevent nausea and vomiting after your treatment, we encourage you to take your nausea medication as directed.  BELOW ARE SYMPTOMS THAT SHOULD BE REPORTED IMMEDIATELY: *FEVER GREATER THAN 100.4 F (38 C) OR HIGHER *CHILLS OR SWEATING *NAUSEA AND VOMITING THAT IS NOT CONTROLLED WITH YOUR NAUSEA MEDICATION *UNUSUAL SHORTNESS OF BREATH *UNUSUAL BRUISING OR BLEEDING *URINARY PROBLEMS (pain or burning when urinating, or frequent urination) *BOWEL PROBLEMS (unusual diarrhea, constipation, pain near the anus) TENDERNESS IN MOUTH AND THROAT WITH OR WITHOUT PRESENCE OF ULCERS (sore throat, sores in mouth, or a toothache) UNUSUAL RASH, SWELLING OR PAIN  UNUSUAL VAGINAL DISCHARGE OR ITCHING   Items with * indicate a potential emergency and should be followed up as soon as possible or go to the Emergency Department if any problems should occur.  Please show the  CHEMOTHERAPY ALERT CARD or IMMUNOTHERAPY ALERT CARD at check-in to the Emergency Department and triage nurse.  Should you have questions after your visit or need to cancel or reschedule your appointment, please contact [facility]  [phone] and follow the prompts.  Office hours are 8:00 a.m. to 4:30 p.m. Monday - Friday. Please note that voicemails left after 4:00 p.m. may not be returned until the following business day.  We are closed weekends and major holidays. You have access to a nurse at all times for urgent questions. Please call the main number to the clinic [phone] and follow the prompts.  For any non-urgent questions, you may also contact your provider using MyChart. We now offer e-Visits for anyone 51 and older to request care online for non-urgent symptoms. For details visit [email or web address].   Also download the MyChart app! Go to the app store, search "MyChart", open the app, select [facility], and log in with your MyChart username and password.  Due to Covid, a mask is required upon entering the hospital/clinic. If you do not have a mask, one will be given to you upon arrival. For doctor visits, patients may have 1 support person aged 59 or older with them. For treatment visits, patients cannot have anyone with them due to current Covid guidelines and our immunocompromised population.

## 2021-06-10 ENCOUNTER — Other Ambulatory Visit: Payer: Self-pay | Admitting: Oncology

## 2021-06-15 ENCOUNTER — Inpatient Hospital Stay: Payer: 59 | Attending: Oncology

## 2021-06-15 ENCOUNTER — Inpatient Hospital Stay: Payer: 59

## 2021-06-15 ENCOUNTER — Encounter: Payer: Self-pay | Admitting: Oncology

## 2021-06-15 ENCOUNTER — Other Ambulatory Visit: Payer: Self-pay

## 2021-06-15 ENCOUNTER — Inpatient Hospital Stay (HOSPITAL_BASED_OUTPATIENT_CLINIC_OR_DEPARTMENT_OTHER): Payer: 59 | Admitting: Oncology

## 2021-06-15 VITALS — BP 117/94 | HR 78 | Temp 96.7°F | Resp 18 | Wt 213.0 lb

## 2021-06-15 DIAGNOSIS — Z5112 Encounter for antineoplastic immunotherapy: Secondary | ICD-10-CM | POA: Insufficient documentation

## 2021-06-15 DIAGNOSIS — Z79899 Other long term (current) drug therapy: Secondary | ICD-10-CM | POA: Diagnosis not present

## 2021-06-15 DIAGNOSIS — C7970 Secondary malignant neoplasm of unspecified adrenal gland: Secondary | ICD-10-CM | POA: Insufficient documentation

## 2021-06-15 DIAGNOSIS — E2740 Unspecified adrenocortical insufficiency: Secondary | ICD-10-CM | POA: Diagnosis not present

## 2021-06-15 DIAGNOSIS — Z87891 Personal history of nicotine dependence: Secondary | ICD-10-CM | POA: Insufficient documentation

## 2021-06-15 DIAGNOSIS — C7951 Secondary malignant neoplasm of bone: Secondary | ICD-10-CM | POA: Insufficient documentation

## 2021-06-15 DIAGNOSIS — C3491 Malignant neoplasm of unspecified part of right bronchus or lung: Secondary | ICD-10-CM

## 2021-06-15 DIAGNOSIS — R519 Headache, unspecified: Secondary | ICD-10-CM | POA: Diagnosis not present

## 2021-06-15 DIAGNOSIS — Z5111 Encounter for antineoplastic chemotherapy: Secondary | ICD-10-CM | POA: Insufficient documentation

## 2021-06-15 DIAGNOSIS — G893 Neoplasm related pain (acute) (chronic): Secondary | ICD-10-CM | POA: Diagnosis not present

## 2021-06-15 DIAGNOSIS — K219 Gastro-esophageal reflux disease without esophagitis: Secondary | ICD-10-CM | POA: Insufficient documentation

## 2021-06-15 DIAGNOSIS — I10 Essential (primary) hypertension: Secondary | ICD-10-CM | POA: Insufficient documentation

## 2021-06-15 DIAGNOSIS — Z800 Family history of malignant neoplasm of digestive organs: Secondary | ICD-10-CM | POA: Insufficient documentation

## 2021-06-15 LAB — CBC WITH DIFFERENTIAL/PLATELET
Abs Immature Granulocytes: 0.04 10*3/uL (ref 0.00–0.07)
Basophils Absolute: 0.1 10*3/uL (ref 0.0–0.1)
Basophils Relative: 1 %
Eosinophils Absolute: 0.2 10*3/uL (ref 0.0–0.5)
Eosinophils Relative: 2 %
HCT: 41.4 % (ref 36.0–46.0)
Hemoglobin: 13.8 g/dL (ref 12.0–15.0)
Immature Granulocytes: 1 %
Lymphocytes Relative: 21 %
Lymphs Abs: 1.8 10*3/uL (ref 0.7–4.0)
MCH: 29.9 pg (ref 26.0–34.0)
MCHC: 33.3 g/dL (ref 30.0–36.0)
MCV: 89.8 fL (ref 80.0–100.0)
Monocytes Absolute: 0.9 10*3/uL (ref 0.1–1.0)
Monocytes Relative: 10 %
Neutro Abs: 5.7 10*3/uL (ref 1.7–7.7)
Neutrophils Relative %: 65 %
Platelets: 335 10*3/uL (ref 150–400)
RBC: 4.61 MIL/uL (ref 3.87–5.11)
RDW: 14.2 % (ref 11.5–15.5)
WBC: 8.6 10*3/uL (ref 4.0–10.5)
nRBC: 0 % (ref 0.0–0.2)

## 2021-06-15 LAB — COMPREHENSIVE METABOLIC PANEL
ALT: 26 U/L (ref 0–44)
AST: 21 U/L (ref 15–41)
Albumin: 3.8 g/dL (ref 3.5–5.0)
Alkaline Phosphatase: 86 U/L (ref 38–126)
Anion gap: 7 (ref 5–15)
BUN: 15 mg/dL (ref 6–20)
CO2: 24 mmol/L (ref 22–32)
Calcium: 9 mg/dL (ref 8.9–10.3)
Chloride: 105 mmol/L (ref 98–111)
Creatinine, Ser: 0.84 mg/dL (ref 0.44–1.00)
GFR, Estimated: >60 mL/min (ref >60)
Glucose, Bld: 137 mg/dL — ABNORMAL HIGH (ref 70–99)
Potassium: 3.9 mmol/L (ref 3.5–5.1)
Sodium: 136 mmol/L (ref 135–145)
Total Bilirubin: 1.1 mg/dL (ref 0.3–1.2)
Total Protein: 7.4 g/dL (ref 6.5–8.1)

## 2021-06-15 MED ORDER — PALONOSETRON HCL INJECTION 0.25 MG/5ML
0.2500 mg | Freq: Once | INTRAVENOUS | Status: AC
  Administered 2021-06-15: 0.25 mg via INTRAVENOUS
  Filled 2021-06-15: qty 5

## 2021-06-15 MED ORDER — SODIUM CHLORIDE 0.9 % IV SOLN
Freq: Once | INTRAVENOUS | Status: AC
  Filled 2021-06-15: qty 250

## 2021-06-15 MED ORDER — HEPARIN SOD (PORK) LOCK FLUSH 100 UNIT/ML IV SOLN
500.0000 [IU] | Freq: Once | INTRAVENOUS | Status: AC | PRN
  Administered 2021-06-15: 500 [IU]
  Filled 2021-06-15: qty 5

## 2021-06-15 MED ORDER — SODIUM CHLORIDE 0.9 % IV SOLN
200.0000 mg | Freq: Once | INTRAVENOUS | Status: AC
  Administered 2021-06-15: 200 mg via INTRAVENOUS
  Filled 2021-06-15: qty 8

## 2021-06-15 MED ORDER — CYANOCOBALAMIN 1000 MCG/ML IJ SOLN
1000.0000 ug | INTRAMUSCULAR | Status: DC
  Administered 2021-06-15: 1000 ug via INTRAMUSCULAR
  Filled 2021-06-15: qty 1

## 2021-06-15 MED ORDER — SODIUM CHLORIDE 0.9 % IV SOLN
900.0000 mg | Freq: Once | INTRAVENOUS | Status: AC
  Administered 2021-06-15: 900 mg via INTRAVENOUS
  Filled 2021-06-15: qty 20

## 2021-06-15 NOTE — Progress Notes (Signed)
Pt states her eyes have been runny a lot along with a headache going on 3-4 days, has never experienced that before so just somewhat concerned. Per husband, severe dry mouth.

## 2021-06-15 NOTE — Patient Instructions (Signed)
[facility]  Discharge Instructions: Thank you for choosing [facility] to provide your oncology and hematology care.  If you have a lab appointment with the [facility], please go directly to the [facility] and check in at the registration area.  Wear comfortable clothing and clothing appropriate for easy access to any Portacath or PICC line.   We strive to give you quality time with your provider. You may need to reschedule your appointment if you arrive late (15 or more minutes).  Arriving late affects you and other patients whose appointments are after yours.  Also, if you miss three or more appointments without notifying the office, you may be dismissed from the clinic at the provider's discretion.      For prescription refill requests, have your pharmacy contact our office and allow 72 hours for refills to be completed.    Today you received the following chemotherapy and/or immunotherapy agents: Keytruda, Alimta, B-12 injection      To help prevent nausea and vomiting after your treatment, we encourage you to take your nausea medication as directed.  BELOW ARE SYMPTOMS THAT SHOULD BE REPORTED IMMEDIATELY: *FEVER GREATER THAN 100.4 F (38 C) OR HIGHER *CHILLS OR SWEATING *NAUSEA AND VOMITING THAT IS NOT CONTROLLED WITH YOUR NAUSEA MEDICATION *UNUSUAL SHORTNESS OF BREATH *UNUSUAL BRUISING OR BLEEDING *URINARY PROBLEMS (pain or burning when urinating, or frequent urination) *BOWEL PROBLEMS (unusual diarrhea, constipation, pain near the anus) TENDERNESS IN MOUTH AND THROAT WITH OR WITHOUT PRESENCE OF ULCERS (sore throat, sores in mouth, or a toothache) UNUSUAL RASH, SWELLING OR PAIN  UNUSUAL VAGINAL DISCHARGE OR ITCHING   Items with * indicate a potential emergency and should be followed up as soon as possible or go to the Emergency Department if any problems should occur.  Please show the CHEMOTHERAPY ALERT CARD or IMMUNOTHERAPY  ALERT CARD at check-in to the Emergency Department and triage nurse.  Should you have questions after your visit or need to cancel or reschedule your appointment, please contact [facility]  [phone] and follow the prompts.  Office hours are 8:00 a.m. to 4:30 p.m. Monday - Friday. Please note that voicemails left after 4:00 p.m. may not be returned until the following business day.  We are closed weekends and major holidays. You have access to a nurse at all times for urgent questions. Please call the main number to the clinic [phone] and follow the prompts.  For any non-urgent questions, you may also contact your provider using MyChart. We now offer e-Visits for anyone 35 and older to request care online for non-urgent symptoms. For details visit [email or web address].   Also download the MyChart app! Go to the app store, search "MyChart", open the app, select [facility], and log in with your MyChart username and password.  Due to Covid, a mask is required upon entering the hospital/clinic. If you do not have a mask, one will be given to you upon arrival. For doctor visits, patients may have 1 support person aged 62 or older with them. For treatment visits, patients cannot have anyone with them due to current Covid guidelines and our immunocompromised population. Vitamin B12 Injection What is this medication? Vitamin B12 (VAHY tuh min B12) prevents and treats low vitamin B12 levels in your body. It is used in people who do not get enough vitamin B12 from their diet or when their digestive tract does not absorb enough. Vitamin B12 plays an important role in  maintaining the health of your nervous system and red blood cells. This medicine may be used for other purposes; ask your health care provider or pharmacist if you have questions. COMMON BRAND NAME(S): B-12 Compliance Kit, B-12 Injection Kit, Cyomin, Dodex, LA-12, Nutri-Twelve, Physicians EZ Use B-12,  Primabalt What should I tell my care team before I take this medication? They need to know if you have any of these conditions: Kidney disease Leber's disease Megaloblastic anemia An unusual or allergic reaction to cyanocobalamin, cobalt, other medications, foods, dyes, or preservatives Pregnant or trying to get pregnant Breast-feeding How should I use this medication? This medication is injected into a muscle or deeply under the skin. It is usually given in a clinic or care team's office. However, your care team may teach you how to inject yourself. Follow all instructions. Talk to your care team about the use of this medication in children. Special care may be needed. Overdosage: If you think you have taken too much of this medicine contact a poison control center or emergency room at once. NOTE: This medicine is only for you. Do not share this medicine with others. What if I miss a dose? If you are given your dose at a clinic or care team's office, call to reschedule your appointment. If you give your own injections, and you miss a dose, take it as soon as you can. If it is almost time for your next dose, take only that dose. Do not take double or extra doses. What may interact with this medication? Colchicine Heavy alcohol intake This list may not describe all possible interactions. Give your health care provider a list of all the medicines, herbs, non-prescription drugs, or dietary supplements you use. Also tell them if you smoke, drink alcohol, or use illegal drugs. Some items may interact with your medicine. What should I watch for while using this medication? Visit your care team regularly. You may need blood work done while you are taking this medication. You may need to follow a special diet. Talk to your care team. Limit your alcohol intake and avoid smoking to get the best benefit. What side effects may I notice from receiving this medication? Side effects that you should report  to your care team as soon as possible: Allergic reactions-skin rash, itching, hives, swelling of the face, lips, tongue, or throat Swelling of the ankles, hands, or feet Trouble breathing Side effects that usually do not require medical attention (report to your care team if they continue or are bothersome): Diarrhea This list may not describe all possible side effects. Call your doctor for medical advice about side effects. You may report side effects to FDA at 1-800-FDA-1088. Where should I keep my medication? Keep out of the reach of children. Store at room temperature between 15 and 30 degrees C (59 and 85 degrees F). Protect from light. Throw away any unused medication after the expiration date. NOTE: This sheet is a summary. It may not cover all possible information. If you have questions about this medicine, talk to your doctor, pharmacist, or health care provider.  2022 Elsevier/Gold Standard (2020-10-20 11:47:06) Pemetrexed injection What is this medication? PEMETREXED (PEM e TREX ed) is a chemotherapy drug used to treat lung cancers like non-small cell lung cancer and mesothelioma. It may also be used to treat other cancers. This medicine may be used for other purposes; ask your health care provider or pharmacist if you have questions. COMMON BRAND NAME(S): Alimta, PEMFEXY What should I tell my  care team before I take this medication? They need to know if you have any of these conditions: infection (especially a virus infection such as chickenpox, cold sores, or herpes) kidney disease low blood counts, like low white cell, platelet, or red cell counts lung or breathing disease, like asthma radiation therapy an unusual or allergic reaction to pemetrexed, other medicines, foods, dyes, or preservative pregnant or trying to get pregnant breast-feeding How should I use this medication? This drug is given as an infusion into a vein. It is administered in a hospital or clinic by a  specially trained health care professional. Talk to your pediatrician regarding the use of this medicine in children. Special care may be needed. Overdosage: If you think you have taken too much of this medicine contact a poison control center or emergency room at once. NOTE: This medicine is only for you. Do not share this medicine with others. What if I miss a dose? It is important not to miss your dose. Call your doctor or health care professional if you are unable to keep an appointment. What may interact with this medication? This medicine may interact with the following medications: Ibuprofen This list may not describe all possible interactions. Give your health care provider a list of all the medicines, herbs, non-prescription drugs, or dietary supplements you use. Also tell them if you smoke, drink alcohol, or use illegal drugs. Some items may interact with your medicine. What should I watch for while using this medication? Visit your doctor for checks on your progress. This drug may make you feel generally unwell. This is not uncommon, as chemotherapy can affect healthy cells as well as cancer cells. Report any side effects. Continue your course of treatment even though you feel ill unless your doctor tells you to stop. In some cases, you may be given additional medicines to help with side effects. Follow all directions for their use. Call your doctor or health care professional for advice if you get a fever, chills or sore throat, or other symptoms of a cold or flu. Do not treat yourself. This drug decreases your body's ability to fight infections. Try to avoid being around people who are sick. This medicine may increase your risk to bruise or bleed. Call your doctor or health care professional if you notice any unusual bleeding. Be careful brushing and flossing your teeth or using a toothpick because you may get an infection or bleed more easily. If you have any dental work done, tell your  dentist you are receiving this medicine. Avoid taking products that contain aspirin, acetaminophen, ibuprofen, naproxen, or ketoprofen unless instructed by your doctor. These medicines may hide a fever. Call your doctor or health care professional if you get diarrhea or mouth sores. Do not treat yourself. To protect your kidneys, drink water or other fluids as directed while you are taking this medicine. Do not become pregnant while taking this medicine or for 6 months after stopping it. Women should inform their doctor if they wish to become pregnant or think they might be pregnant. Men should not father a child while taking this medicine and for 3 months after stopping it. This may interfere with the ability to father a child. You should talk to your doctor or health care professional if you are concerned about your fertility. There is a potential for serious side effects to an unborn child. Talk to your health care professional or pharmacist for more information. Do not breast-feed an infant while taking this  medicine or for 1 week after stopping it. What side effects may I notice from receiving this medication? Side effects that you should report to your doctor or health care professional as soon as possible: allergic reactions like skin rash, itching or hives, swelling of the face, lips, or tongue breathing problems redness, blistering, peeling or loosening of the skin, including inside the mouth signs and symptoms of bleeding such as bloody or black, tarry stools; red or dark-brown urine; spitting up blood or brown material that looks like coffee grounds; red spots on the skin; unusual bruising or bleeding from the eye, gums, or nose signs and symptoms of infection like fever or chills; cough; sore throat; pain or trouble passing urine signs and symptoms of kidney injury like trouble passing urine or change in the amount of urine signs and symptoms of liver injury like dark yellow or brown urine;  general ill feeling or flu-like symptoms; light-colored stools; loss of appetite; nausea; right upper belly pain; unusually weak or tired; yellowing of the eyes or skin Side effects that usually do not require medical attention (report to your doctor or health care professional if they continue or are bothersome): constipation mouth sores nausea, vomiting unusually weak or tired This list may not describe all possible side effects. Call your doctor for medical advice about side effects. You may report side effects to FDA at 1-800-FDA-1088. Where should I keep my medication? This drug is given in a hospital or clinic and will not be stored at home. NOTE: This sheet is a summary. It may not cover all possible information. If you have questions about this medicine, talk to your doctor, pharmacist, or health care provider.  2022 Elsevier/Gold Standard (2017-10-19 16:11:33) Pembrolizumab injection What is this medication? PEMBROLIZUMAB (pem broe liz ue mab) is a monoclonal antibody. It is used to treat certain types of cancer. This medicine may be used for other purposes; ask your health care provider or pharmacist if you have questions. COMMON BRAND NAME(S): Keytruda What should I tell my care team before I take this medication? They need to know if you have any of these conditions: autoimmune diseases like Crohn's disease, ulcerative colitis, or lupus have had or planning to have an allogeneic stem cell transplant (uses someone else's stem cells) history of organ transplant history of chest radiation nervous system problems like myasthenia gravis or Guillain-Barre syndrome an unusual or allergic reaction to pembrolizumab, other medicines, foods, dyes, or preservatives pregnant or trying to get pregnant breast-feeding How should I use this medication? This medicine is for infusion into a vein. It is given by a health care professional in a hospital or clinic setting. A special MedGuide will  be given to you before each treatment. Be sure to read this information carefully each time. Talk to your pediatrician regarding the use of this medicine in children. While this drug may be prescribed for children as young as 6 months for selected conditions, precautions do apply. Overdosage: If you think you have taken too much of this medicine contact a poison control center or emergency room at once. NOTE: This medicine is only for you. Do not share this medicine with others. What if I miss a dose? It is important not to miss your dose. Call your doctor or health care professional if you are unable to keep an appointment. What may interact with this medication? Interactions have not been studied. This list may not describe all possible interactions. Give your health care provider a list of  all the medicines, herbs, non-prescription drugs, or dietary supplements you use. Also tell them if you smoke, drink alcohol, or use illegal drugs. Some items may interact with your medicine. What should I watch for while using this medication? Your condition will be monitored carefully while you are receiving this medicine. You may need blood work done while you are taking this medicine. Do not become pregnant while taking this medicine or for 4 months after stopping it. Women should inform their doctor if they wish to become pregnant or think they might be pregnant. There is a potential for serious side effects to an unborn child. Talk to your health care professional or pharmacist for more information. Do not breast-feed an infant while taking this medicine or for 4 months after the last dose. What side effects may I notice from receiving this medication? Side effects that you should report to your doctor or health care professional as soon as possible: allergic reactions like skin rash, itching or hives, swelling of the face, lips, or tongue bloody or black, tarry breathing problems changes in  vision chest pain chills confusion constipation cough diarrhea dizziness or feeling faint or lightheaded fast or irregular heartbeat fever flushing joint pain low blood counts - this medicine may decrease the number of white blood cells, red blood cells and platelets. You may be at increased risk for infections and bleeding. muscle pain muscle weakness pain, tingling, numbness in the hands or feet persistent headache redness, blistering, peeling or loosening of the skin, including inside the mouth signs and symptoms of high blood sugar such as dizziness; dry mouth; dry skin; fruity breath; nausea; stomach pain; increased hunger or thirst; increased urination signs and symptoms of kidney injury like trouble passing urine or change in the amount of urine signs and symptoms of liver injury like dark urine, light-colored stools, loss of appetite, nausea, right upper belly pain, yellowing of the eyes or skin sweating swollen lymph nodes weight loss Side effects that usually do not require medical attention (report to your doctor or health care professional if they continue or are bothersome): decreased appetite hair loss tiredness This list may not describe all possible side effects. Call your doctor for medical advice about side effects. You may report side effects to FDA at 1-800-FDA-1088. Where should I keep my medication? This drug is given in a hospital or clinic and will not be stored at home. NOTE: This sheet is a summary. It may not cover all possible information. If you have questions about this medicine, talk to your doctor, pharmacist, or health care provider.  2022 Elsevier/Gold Standard (2019-08-01 21:44:53)

## 2021-06-15 NOTE — Progress Notes (Signed)
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  She underwent operative fixation on 10/12/2020.  MRI of femur showed heterogeneously enhancing osseous lesion within the area which would be nonspecific versus office neoplasm or metastatic lesion.  CT chest abdomen and pelvis with contrast showed an enlarged pretracheal lymph node 2.2 x 1.5 cm and a right paratracheal lymph node measuring 2.7 x 1.3 cm.  Prevascular node measuring 0.3 x 0.9 cm.  5 x 4 mm right middle lobe nodule.  2.8 x 1.9 cm left adrenal lesion.    Reamings from the right femur showed metastatic poorly differentiated carcinoma.  Immunohistochemistry showed was positive for pancytokeratin, CK7 and patchy CK20 with patchy dim expression of TTF-1.  Cells negative for Melan-A, CDX2, PAX8, Napsin A, GATA3, p40, CD56, p16 and thyroglobulin.  Findings compatible with metastatic carcinoma but because of decalcification immunohistochemical staining is unreliable.  Patchy dim staining with TTF-1 suspicious for lung primary but not a definitive diagnosis.   Repeat supraclavicular lymph node biopsy showed  metastatic adenocarcinoma.  Tumor cells positive for CK7 with focal weak staining for TTF-1.  Suggestive of lung origin in the proper clinical context.  Cells were negative for GATA3 PAX8 CDX2 and CK20 and Napsin A.  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  She is currently on hydrocortisone twice daily.  Thyroid functions presently are normal  Interval history-patient reports doing well overall.  She has had some mild self-limited headaches as well as nasal congestion over the last few days.  Denies other complaints at this time  ECOG PS- 1 Pain scale- 0 Opioid associated constipation- no  Review of systems- Review of Systems  Constitutional:  Negative for chills, fever, malaise/fatigue and weight loss.  HENT:  Negative for congestion, ear discharge and nosebleeds.   Eyes:  Negative for blurred vision.  Respiratory:  Negative for cough, hemoptysis, sputum production, shortness of breath and wheezing.   Cardiovascular:  Negative for chest pain, palpitations, orthopnea and claudication.  Gastrointestinal:  Negative for abdominal pain, blood in stool, constipation, diarrhea, heartburn, melena, nausea and vomiting.  Genitourinary:  Negative for dysuria, flank pain, frequency, hematuria and urgency.  Musculoskeletal:  Negative for back pain, joint pain and myalgias.  Skin:  Negative for rash.  Neurological:  Positive for headaches. Negative for dizziness, tingling, focal weakness, seizures and weakness.  Endo/Heme/Allergies:  Does not bruise/bleed easily.  Psychiatric/Behavioral:  Negative for depression and suicidal ideas. The patient does not have insomnia.      Allergies  Allergen Reactions   Factive [Gemifloxacin] Rash     Past Medical History:  Diagnosis Date   Abnormal  Pap smear of cervix    Anxiety    Depression    Diverticulosis     Essential hypertension    Femur fracture, right (HCC)    GERD (gastroesophageal reflux disease)    Lung cancer (HCC)    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   Palpitations    Precordial chest pain    Wrist fracture 03/2021   left     Past Surgical History:  Procedure Laterality Date   BREAST BIOPSY Right 2017   benign   CERVICAL BIOPSY  W/ LOOP ELECTRODE EXCISION     COLONOSCOPY WITH PROPOFOL N/A 07/03/2015   Procedure: COLONOSCOPY WITH PROPOFOL;  Surgeon: Hulen Luster, MD;  Location: [facility] ENDOSCOPY;  Service: Gastroenterology;  Laterality: N/A;   ESOPHAGOGASTRODUODENOSCOPY (EGD) WITH PROPOFOL N/A 07/03/2015   Procedure: ESOPHAGOGASTRODUODENOSCOPY (EGD) WITH PROPOFOL;  Surgeon: Hulen Luster, MD;  Location: [facility] ENDOSCOPY;  Service: Gastroenterology;  Laterality: N/A;   INTRAMEDULLARY (IM) NAIL INTERTROCHANTERIC Right 09/15/2020   Procedure: INTRAMEDULLARY (IM) NAIL INTERTROCHANTRIC;  Surgeon: Corky Mull, MD;  Location: [facility] ORS;  Service: Orthopedics;  Laterality: Right;   IR IMAGING GUIDED PORT INSERTION  11/28/2020   LEFT HEART CATH AND CORONARY ANGIOGRAPHY N/A 03/28/2017   Procedure: Left Heart Cath and Coronary Angiography;  Surgeon: Lorretta Harp, MD;  Location: [place] CV LAB;  Service: Cardiovascular;  Laterality: N/A;   ORIF WRIST FRACTURE Left 03/31/2021   Procedure: OPEN REDUCTION INTERNAL FIXATION (ORIF) LEFT DISTAL RADIUS FRACTURE.;  Surgeon: Corky Mull, MD;  Location: [facility] ORS;  Service: Orthopedics;  Laterality: Left;    Social History   Socioeconomic History   Marital status: Married    Spouse name: Gene   Number of children: 2   Years of education: Not on file   Highest education level: Not on file  Occupational History   Not on file  Tobacco Use   Smoking status: Former    Packs/day: 1.00    Years: 30.00    Pack years: 30.00    Types: Cigarettes    Quit date: 09/17/2020    Years since quitting: 0.7   Smokeless tobacco: Never  Vaping Use   Vaping  Use: Never used  Substance and Sexual Activity   Alcohol use: Not Currently    Alcohol/week: 1.0 standard drink    Types: 1 Standard drinks or equivalent per week    Comment: beer occ.   Drug use: No   Sexual activity: Yes    Birth control/protection: Post-menopausal  Other Topics Concern   Not on file  Social History Narrative   Lives in [place] with her husband.  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   Social Determinants of Health   Financial Resource Strain: Not on file  Food Insecurity: Not on file  Transportation Needs: Not on file  Physical Activity: Not on file  Stress: Not on file  Social Connections: Not on file  Intimate Partner Violence: Not on file    Family History  Problem Relation Age of Onset   Diabetes Mother        alive @ 32   Goiter Mother    Heart disease Father        died of MI @ 33   Osteoporosis Maternal Grandmother    Colon cancer Maternal Grandfather    Breast cancer Neg Hx    Ovarian cancer Neg Hx      Current Outpatient Medications:  acetaminophen (TYLENOL) 325 MG tablet, Take 1-2 tablets (325-650 mg total) by mouth every 6 (six) hours as needed for mild pain (pain score 1-3 or temp > 100.5)., Disp: , Rfl:    dexamethasone (DECADRON) 4 MG tablet, dexamethasone 4 mg tablet, Disp: , Rfl:    enoxaparin (LOVENOX) 40 MG/0.4ML injection, enoxaparin 40 mg/0.4 mL subcutaneous syringe, Disp: , Rfl:    famotidine (PEPCID) 20 MG tablet, Take 20 mg by mouth in the morning., Disp: , Rfl:    folic acid (FOLVITE) 1 MG tablet, Take 1 tablet (1 mg total) by mouth daily., Disp: 90 tablet, Rfl: 1   hydrocortisone (CORTEF) 10 MG tablet, TAKE ONE TABLET AT BEDTIME, Disp: 30 tablet, Rfl: 1   hydrocortisone (CORTEF) 20 MG tablet, TAKE ONE TABLET EVERY MORNING, Disp: 30 tablet, Rfl: 1   lidocaine-prilocaine (EMLA) cream, Apply to affected area once (Patient taking differently: Apply 1 application topically as  needed (prior to port being accessed).), Disp: 30 g, Rfl: 3   LORazepam (ATIVAN) 0.5 MG tablet, lorazepam 0.5 mg tablet, Disp: , Rfl:    metoprolol tartrate (LOPRESSOR) 25 MG tablet, Take 25 mg by mouth in the morning., Disp: , Rfl:    nystatin (MYCOSTATIN) 100000 UNIT/ML suspension, nystatin 100,000 unit/mL oral suspension, Disp: , Rfl:    ondansetron (ZOFRAN) 4 MG tablet, Take 1 tablet (4 mg total) by mouth every 8 (eight) hours as needed for nausea or vomiting., Disp: 30 tablet, Rfl: 1   oxyCODONE (OXY IR/ROXICODONE) 5 MG immediate release tablet, TAKE 1 TO 2 TABLETS BY MOUTH EVERY 4 HOURS AS NEEDED FOR MODERATE PAIN, Disp: 60 tablet, Rfl: 0   Pembrolizumab (KEYTRUDA IV), Inject into the vein. Every 3 weeks, Disp: , Rfl:    PEMEtrexed Disodium (ALIMTA IV), Inject into the vein. Every 3 weeks, Disp: , Rfl:    polyethylene glycol (MIRALAX) 17 g packet, Take 17 g by mouth daily as needed for moderate constipation., Disp: 30 each, Rfl: 0   prochlorperazine (COMPAZINE) 10 MG tablet, Take 1 tablet (10 mg total) by mouth every 6 (six) hours as needed (Nausea or vomiting)., Disp: 30 tablet, Rfl: 1   sertraline (ZOLOFT) 50 MG tablet, sertraline 50 mg tablet, Disp: , Rfl:  No current facility-administered medications for this visit.  Facility-Administered Medications Ordered in Other Visits:    sodium chloride flush (NS) 0.9 % injection 10 mL, 10 mL, Intravenous, PRN, Sindy Guadeloupe, MD, 10 mL at 03/09/21 0906   Zoledronic Acid (ZOMETA) IVPB 4 mg, 4 mg, Intravenous, Q28 days, Sindy Guadeloupe, MD, Stopped at 11/21/20 1100  Physical exam:  Vitals:   06/15/21 0911  BP: (!) 117/94  Pulse: 78  Resp: 18  Temp: (!) 96.7 F (35.9 C)  SpO2: 99%  Weight: 213 lb (96.6 kg)   Physical Exam Constitutional:      General: She is not in acute distress. Cardiovascular:     Rate and Rhythm: Normal rate and regular rhythm.     Heart sounds: Normal heart sounds.  Pulmonary:     Effort: Pulmonary effort is  normal.     Breath sounds: Normal breath sounds.  Skin:    General: Skin is warm and dry.  Neurological:     Mental Status: She is alert and oriented to person, place, and time.     CMP Latest Ref Rng & Units 06/15/2021  Glucose 70 - 99 mg/dL 137(H)  BUN 6 - 20 mg/dL 15  Creatinine 0.44 - 1.00 mg/dL 0.84  Sodium  135 - 145 mmol/L 136  Potassium 3.5 - 5.1 mmol/L 3.9  Chloride 98 - 111 mmol/L 105  CO2 22 - 32 mmol/L 24  Calcium 8.9 - 10.3 mg/dL 9.0  Total Protein 6.5 - 8.1 g/dL 7.4  Total Bilirubin 0.3 - 1.2 mg/dL 1.1  Alkaline Phos 38 - 126 U/L 86  AST 15 - 41 U/L 21  ALT 0 - 44 U/L 26   CBC Latest Ref Rng & Units 06/15/2021  WBC 4.0 - 10.5 K/uL 8.6  Hemoglobin 12.0 - 15.0 g/dL 13.8  Hematocrit 36.0 - 46.0 % 41.4  Platelets 150 - 400 K/uL 335   Assessment and plan- Patient is a 55 y.o. 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  She is here for on treatment assessment prior to cycle 7 of maintenance Alimta and Keytruda  Counseled to proceed with cycle 7 of maintenance Alimta and Keytruda today.  I will see her back in 3 weeks for cycle 8.  She will receive Zometa at that time as well.  B12 shot to be received today.  Autoimmune hypophysitis: Continue hydrocortisone twice daily.  Will check TSH and free T4 at next visit.  As long as T4 is close to normal she does not require any thyroid replacement at this time  Neoplasm related pain: Continue as needed oxycodone  Headaches: Appear to be triggered due to recent episode of nasal congestion which could be secondary to seasonal allergies.  Continue to monitor and if symptoms persist I will consider getting MRI brain   Visit Diagnosis 1. Primary malignant neoplasm of right lung metastatic to other site [facility])   2. Encounter for antineoplastic chemotherapy   3. Encounter for antineoplastic immunotherapy      Dr. Randa Evens, MD, MPH [facility] at [facility] [phone] 06/15/2021 10:15  PM

## 2021-06-27 ENCOUNTER — Other Ambulatory Visit: Payer: Self-pay | Admitting: Oncology

## 2021-06-28 ENCOUNTER — Encounter: Payer: Self-pay | Admitting: Oncology

## 2021-07-01 ENCOUNTER — Other Ambulatory Visit: Payer: Self-pay | Admitting: Oncology

## 2021-07-06 ENCOUNTER — Inpatient Hospital Stay: Payer: 59

## 2021-07-06 ENCOUNTER — Inpatient Hospital Stay (HOSPITAL_BASED_OUTPATIENT_CLINIC_OR_DEPARTMENT_OTHER): Payer: 59 | Admitting: Nurse Practitioner

## 2021-07-06 ENCOUNTER — Other Ambulatory Visit: Payer: Self-pay

## 2021-07-06 VITALS — BP 133/88 | HR 72 | Temp 97.0°F | Resp 16 | Wt 216.9 lb

## 2021-07-06 DIAGNOSIS — C3491 Malignant neoplasm of unspecified part of right bronchus or lung: Secondary | ICD-10-CM

## 2021-07-06 DIAGNOSIS — Z5111 Encounter for antineoplastic chemotherapy: Secondary | ICD-10-CM | POA: Diagnosis not present

## 2021-07-06 DIAGNOSIS — Z5112 Encounter for antineoplastic immunotherapy: Secondary | ICD-10-CM | POA: Diagnosis not present

## 2021-07-06 LAB — COMPREHENSIVE METABOLIC PANEL
ALT: 25 U/L (ref 0–44)
AST: 23 U/L (ref 15–41)
Albumin: 3.6 g/dL (ref 3.5–5.0)
Alkaline Phosphatase: 67 U/L (ref 38–126)
Anion gap: 7 (ref 5–15)
BUN: 16 mg/dL (ref 6–20)
CO2: 25 mmol/L (ref 22–32)
Calcium: 8.8 mg/dL — ABNORMAL LOW (ref 8.9–10.3)
Chloride: 106 mmol/L (ref 98–111)
Creatinine, Ser: 0.87 mg/dL (ref 0.44–1.00)
GFR, Estimated: >60 mL/min (ref >60)
Glucose, Bld: 123 mg/dL — ABNORMAL HIGH (ref 70–99)
Potassium: 3.5 mmol/L (ref 3.5–5.1)
Sodium: 138 mmol/L (ref 135–145)
Total Bilirubin: 1 mg/dL (ref 0.3–1.2)
Total Protein: 6.8 g/dL (ref 6.5–8.1)

## 2021-07-06 LAB — CBC WITH DIFFERENTIAL/PLATELET
Abs Immature Granulocytes: 0.03 10*3/uL (ref 0.00–0.07)
Basophils Absolute: 0.1 10*3/uL (ref 0.0–0.1)
Basophils Relative: 1 %
Eosinophils Absolute: 0.2 10*3/uL (ref 0.0–0.5)
Eosinophils Relative: 3 %
HCT: 39.1 % (ref 36.0–46.0)
Hemoglobin: 13.2 g/dL (ref 12.0–15.0)
Immature Granulocytes: 1 %
Lymphocytes Relative: 33 %
Lymphs Abs: 2.2 10*3/uL (ref 0.7–4.0)
MCH: 30.3 pg (ref 26.0–34.0)
MCHC: 33.8 g/dL (ref 30.0–36.0)
MCV: 89.9 fL (ref 80.0–100.0)
Monocytes Absolute: 0.7 10*3/uL (ref 0.1–1.0)
Monocytes Relative: 11 %
Neutro Abs: 3.4 10*3/uL (ref 1.7–7.7)
Neutrophils Relative %: 51 %
Platelets: 261 10*3/uL (ref 150–400)
RBC: 4.35 MIL/uL (ref 3.87–5.11)
RDW: 14.6 % (ref 11.5–15.5)
WBC: 6.6 10*3/uL (ref 4.0–10.5)
nRBC: 0 % (ref 0.0–0.2)

## 2021-07-06 LAB — TSH: TSH: 1.264 u[IU]/mL (ref 0.350–4.500)

## 2021-07-06 LAB — T4, FREE: Free T4: 1.12 ng/dL (ref 0.61–1.12)

## 2021-07-06 MED ORDER — SODIUM CHLORIDE 0.9 % IV SOLN
Freq: Once | INTRAVENOUS | Status: AC
  Filled 2021-07-06: qty 250

## 2021-07-06 MED ORDER — HEPARIN SOD (PORK) LOCK FLUSH 100 UNIT/ML IV SOLN
500.0000 [IU] | Freq: Once | INTRAVENOUS | Status: DC
  Filled 2021-07-06: qty 5

## 2021-07-06 MED ORDER — HEPARIN SOD (PORK) LOCK FLUSH 100 UNIT/ML IV SOLN
500.0000 [IU] | Freq: Once | INTRAVENOUS | Status: AC | PRN
  Filled 2021-07-06: qty 5

## 2021-07-06 MED ORDER — SODIUM CHLORIDE 0.9 % IV SOLN
200.0000 mg | Freq: Once | INTRAVENOUS | Status: AC
  Administered 2021-07-06: 200 mg via INTRAVENOUS
  Filled 2021-07-06: qty 8

## 2021-07-06 MED ORDER — ZOLEDRONIC ACID 4 MG/100ML IV SOLN
4.0000 mg | Freq: Once | INTRAVENOUS | Status: AC
  Administered 2021-07-06: 4 mg via INTRAVENOUS
  Filled 2021-07-06: qty 100

## 2021-07-06 MED ORDER — PALONOSETRON HCL INJECTION 0.25 MG/5ML
0.2500 mg | Freq: Once | INTRAVENOUS | Status: AC
  Administered 2021-07-06: 0.25 mg via INTRAVENOUS
  Filled 2021-07-06: qty 5

## 2021-07-06 MED ORDER — HEPARIN SOD (PORK) LOCK FLUSH 100 UNIT/ML IV SOLN
INTRAVENOUS | Status: AC
  Administered 2021-07-06: 500 [IU]
  Filled 2021-07-06: qty 5

## 2021-07-06 MED ORDER — SODIUM CHLORIDE 0.9% FLUSH
10.0000 mL | INTRAVENOUS | Status: DC | PRN
  Administered 2021-07-06: 10 mL via INTRAVENOUS
  Filled 2021-07-06: qty 10

## 2021-07-06 MED ORDER — SODIUM CHLORIDE 0.9 % IV SOLN
900.0000 mg | Freq: Once | INTRAVENOUS | Status: AC
  Administered 2021-07-06: 900 mg via INTRAVENOUS
  Filled 2021-07-06: qty 20

## 2021-07-06 NOTE — Progress Notes (Signed)
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  She underwent operative fixation on 10/12/2020.  MRI of femur showed heterogeneously enhancing osseous lesion within the area which would be nonspecific versus office neoplasm or metastatic lesion.  CT chest abdomen and pelvis with contrast showed an enlarged pretracheal lymph node 2.2 x 1.5 cm and a right paratracheal lymph node measuring 2.7 x 1.3 cm.  Prevascular node measuring 0.3 x 0.9 cm.  5 x 4 mm right middle lobe nodule.  2.8 x 1.9 cm left adrenal lesion.    Reamings from the right femur showed metastatic poorly differentiated carcinoma.  Immunohistochemistry showed was positive for pancytokeratin, CK7 and patchy CK20 with patchy dim expression of TTF-1.  Cells negative for Melan-A, CDX2, PAX8, Napsin A, GATA3, p40, CD56, p16 and thyroglobulin.  Findings compatible with metastatic carcinoma but because of decalcification immunohistochemical staining is unreliable.  Patchy dim staining with TTF-1 suspicious for lung primary but not a definitive diagnosis.   Repeat supraclavicular lymph node biopsy showed  metastatic adenocarcinoma.  Tumor cells positive for CK7 with focal weak staining for TTF-1.  Suggestive of lung origin in the proper clinical context.  Cells were negative for GATA3 PAX8 CDX2 and CK20 and Napsin A.  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  She is currently on hydrocortisone twice daily.  Thyroid functions presently are normal  Interval history- Patient returns to clinic for labs, further evaluation, and consideration of maintenance alimta and [place]. She feels well. Headaches resolved. Feels well. Denies other complaints.   ECOG PS- 1 Pain scale- 0 Opioid associated constipation- no  Review of systems- Review of Systems  Constitutional:  Negative for chills, fever, malaise/fatigue and weight loss.  HENT:  Negative for congestion, ear discharge and nosebleeds.   Eyes:  Negative for blurred vision.  Respiratory:  Negative for cough, hemoptysis, sputum production, shortness of breath and wheezing.   Cardiovascular:  Negative for chest pain, palpitations, orthopnea and claudication.  Gastrointestinal:  Negative for abdominal pain, blood in stool, constipation, diarrhea, heartburn, melena, nausea and vomiting.  Genitourinary:  Negative for dysuria, flank pain, frequency, hematuria and urgency.  Musculoskeletal:  Negative for back pain, joint pain and myalgias.  Skin:  Negative for rash.  Neurological:  Negative for dizziness, tingling, focal weakness, seizures, weakness and headaches.  Endo/Heme/Allergies:  Does not bruise/bleed easily.  Psychiatric/Behavioral:  Negative for depression and suicidal ideas. The patient is not nervous/anxious and does not have insomnia.      Allergies  Allergen Reactions   Factive [Gemifloxacin] Rash    Past Medical History:  Diagnosis Date   Abnormal Pap smear of cervix  Anxiety    Depression     Diverticulosis    Essential hypertension    Femur fracture, right (HCC)    GERD (gastroesophageal reflux disease)    Lung cancer (HCC)    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   Palpitations    Precordial chest pain    Wrist fracture 03/2021   left    Past Surgical History:  Procedure Laterality Date   BREAST BIOPSY Right 2017   benign   CERVICAL BIOPSY  W/ LOOP ELECTRODE EXCISION     COLONOSCOPY WITH PROPOFOL N/A 07/03/2015   Procedure: COLONOSCOPY WITH PROPOFOL;  Surgeon: Hulen Luster, MD;  Location: [facility] ENDOSCOPY;  Service: Gastroenterology;  Laterality: N/A;   ESOPHAGOGASTRODUODENOSCOPY (EGD) WITH PROPOFOL N/A 07/03/2015   Procedure: ESOPHAGOGASTRODUODENOSCOPY (EGD) WITH PROPOFOL;  Surgeon: Hulen Luster, MD;  Location: [facility] ENDOSCOPY;  Service: Gastroenterology;  Laterality: N/A;   INTRAMEDULLARY (IM) NAIL INTERTROCHANTERIC Right 09/15/2020   Procedure: INTRAMEDULLARY (IM) NAIL INTERTROCHANTRIC;  Surgeon: Corky Mull, MD;  Location: [facility] ORS;  Service: Orthopedics;  Laterality: Right;   IR IMAGING GUIDED PORT INSERTION  11/28/2020   LEFT HEART CATH AND CORONARY ANGIOGRAPHY N/A 03/28/2017   Procedure: Left Heart Cath and Coronary Angiography;  Surgeon: Lorretta Harp, MD;  Location: [place] CV LAB;  Service: Cardiovascular;  Laterality: N/A;   ORIF WRIST FRACTURE Left 03/31/2021   Procedure: OPEN REDUCTION INTERNAL FIXATION (ORIF) LEFT DISTAL RADIUS FRACTURE.;  Surgeon: Corky Mull, MD;  Location: [facility] ORS;  Service: Orthopedics;  Laterality: Left;    Social History   Socioeconomic History   Marital status: Married    Spouse name: Gene   Number of children: 2   Years of education: Not on file   Highest education level: Not on file  Occupational History   Not on file  Tobacco Use   Smoking status: Former    Packs/day: 1.00    Years: 30.00    Pack years: 30.00    Types: Cigarettes    Quit date: 09/17/2020    Years since quitting: 0.8   Smokeless tobacco: Never  Vaping  Use   Vaping Use: Never used  Substance and Sexual Activity   Alcohol use: Not Currently    Alcohol/week: 1.0 standard drink    Types: 1 Standard drinks or equivalent per week    Comment: beer occ.   Drug use: No   Sexual activity: Yes    Birth control/protection: Post-menopausal  Other Topics Concern   Not on file  Social History Narrative   Lives in [place] with her husband.  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   Social Determinants of Health   Financial Resource Strain: Not on file  Food Insecurity: Not on file  Transportation Needs: Not on file  Physical Activity: Not on file  Stress: Not on file  Social Connections: Not on file  Intimate Partner Violence: Not on file    Family History  Problem Relation Age of Onset   Diabetes Mother        alive @ 49   Goiter Mother    Heart disease Father        died of MI @ 92   Osteoporosis Maternal Grandmother    Colon cancer Maternal Grandfather    Breast cancer Neg Hx    Ovarian cancer Neg Hx      Current Outpatient Medications:    acetaminophen (TYLENOL) 325 MG tablet, Take 1-2 tablets (  325-650 mg total) by mouth every 6 (six) hours as needed for mild pain (pain score 1-3 or temp > 100.5)., Disp: , Rfl:    ALPRAZolam (XANAX) 0.5 MG tablet, TAKE 1 TABLET BY MOUTH DAILY AS NEEDED, Disp: 30 tablet, Rfl: 0   dexamethasone (DECADRON) 4 MG tablet, dexamethasone 4 mg tablet, Disp: , Rfl:    famotidine (PEPCID) 20 MG tablet, Take 20 mg by mouth in the morning., Disp: , Rfl:    folic acid (FOLVITE) 1 MG tablet, Take 1 tablet (1 mg total) by mouth daily., Disp: 90 tablet, Rfl: 1   hydrocortisone (CORTEF) 10 MG tablet, TAKE ONE TABLET AT BEDTIME, Disp: 30 tablet, Rfl: 1   hydrocortisone (CORTEF) 20 MG tablet, TAKE ONE TABLET EVERY MORNING, Disp: 30 tablet, Rfl: 1   lidocaine-prilocaine (EMLA) cream, Apply to affected area once (Patient taking differently: Apply 1 application topically  as needed (prior to port being accessed).), Disp: 30 g, Rfl: 3   metoprolol tartrate (LOPRESSOR) 25 MG tablet, Take 25 mg by mouth in the morning., Disp: , Rfl:    nystatin (MYCOSTATIN) 100000 UNIT/ML suspension, nystatin 100,000 unit/mL oral suspension, Disp: , Rfl:    ondansetron (ZOFRAN) 4 MG tablet, Take 1 tablet (4 mg total) by mouth every 8 (eight) hours as needed for nausea or vomiting., Disp: 30 tablet, Rfl: 1   oxyCODONE (OXY IR/ROXICODONE) 5 MG immediate release tablet, TAKE 1 TO 2 TABLETS BY MOUTH EVERY 4 HOURS AS NEEDED FOR MODERATE PAIN, Disp: 60 tablet, Rfl: 0   Pembrolizumab (KEYTRUDA IV), Inject into the vein. Every 3 weeks, Disp: , Rfl:    PEMEtrexed Disodium (ALIMTA IV), Inject into the vein. Every 3 weeks, Disp: , Rfl:    polyethylene glycol (MIRALAX) 17 g packet, Take 17 g by mouth daily as needed for moderate constipation., Disp: 30 each, Rfl: 0   prochlorperazine (COMPAZINE) 10 MG tablet, Take 1 tablet (10 mg total) by mouth every 6 (six) hours as needed (Nausea or vomiting)., Disp: 30 tablet, Rfl: 1   sertraline (ZOLOFT) 50 MG tablet, sertraline 50 mg tablet, Disp: , Rfl:    enoxaparin (LOVENOX) 40 MG/0.4ML injection, enoxaparin 40 mg/0.4 mL subcutaneous syringe (Patient not taking: Reported on 07/06/2021), Disp: , Rfl:    LORazepam (ATIVAN) 0.5 MG tablet, lorazepam 0.5 mg tablet (Patient not taking: Reported on 07/06/2021), Disp: , Rfl:  No current facility-administered medications for this visit.  Facility-Administered Medications Ordered in Other Visits:    sodium chloride flush (NS) 0.9 % injection 10 mL, 10 mL, Intravenous, PRN, Sindy Guadeloupe, MD, 10 mL at 03/09/21 0906   Zoledronic Acid (ZOMETA) IVPB 4 mg, 4 mg, Intravenous, Q28 days, Sindy Guadeloupe, MD, Stopped at 11/21/20 1100  Physical exam:  Vitals:   07/06/21 0912  BP: 133/88  Pulse: 72  Resp: 16  Temp: (!) 97 F (36.1 C)  Weight: 216 lb 14.4 oz (98.4 kg)   Physical Exam Constitutional:      General:  She is not in acute distress.    Appearance: She is well-developed. She is not ill-appearing.  HENT:     Head: Atraumatic.     Nose: Nose normal.     Mouth/Throat:     Pharynx: No oropharyngeal exudate.  Eyes:     General: No scleral icterus. Cardiovascular:     Rate and Rhythm: Normal rate and regular rhythm.  Pulmonary:     Effort: Pulmonary effort is normal.     Breath sounds: Normal breath sounds.  Abdominal:     General: There is no distension.     Palpations: Abdomen is soft.     Tenderness: There is no abdominal tenderness.  Musculoskeletal:        General: No tenderness or deformity. Normal range of motion.     Cervical back: Normal range of motion and neck supple.  Skin:    General: Skin is warm and dry.     Coloration: Skin is not pale.  Neurological:     General: No focal deficit present.     Mental Status: She is alert and oriented to person, place, and time.  Psychiatric:        Mood and Affect: Mood normal.        Behavior: Behavior normal.     CMP Latest Ref Rng & Units 07/06/2021  Glucose 70 - 99 mg/dL 123(H)  BUN 6 - 20 mg/dL 16  Creatinine 0.44 - 1.00 mg/dL 0.87  Sodium 135 - 145 mmol/L 138  Potassium 3.5 - 5.1 mmol/L 3.5  Chloride 98 - 111 mmol/L 106  CO2 22 - 32 mmol/L 25  Calcium 8.9 - 10.3 mg/dL 8.8(L)  Total Protein 6.5 - 8.1 g/dL 6.8  Total Bilirubin 0.3 - 1.2 mg/dL 1.0  Alkaline Phos 38 - 126 U/L 67  AST 15 - 41 U/L 23  ALT 0 - 44 U/L 25   CBC Latest Ref Rng & Units 07/06/2021  WBC 4.0 - 10.5 K/uL 6.6  Hemoglobin 12.0 - 15.0 g/dL 13.2  Hematocrit 36.0 - 46.0 % 39.1  Platelets 150 - 400 K/uL 261   Assessment and plan- Patient is a 55 y.o. 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  She is here for on treatment assessment prior to cycle 8 of maintenance Alimta and Keytruda  Counts acceptable to proceed with cycle 8 of maintenance alimta and [place] today. She will receive zometa today as well. Continue b12  monthly.   Autoimmune hypophysitis: Continue hydrocortisone twice daily.  TSH and free T4 normal. She does not require thyroid replacement at this time.   Neoplasm related pain: Continue as needed oxycodone  Headaches: resolved.   RTC in 3 weeks for labs (cbc, cmp), Dr. Janese Banks, alimta, Beryle Flock. She'll continue to receive b12 and zometa monthly.    Visit Diagnosis 1. Encounter for antineoplastic immunotherapy   2. Primary malignant neoplasm of right lung metastatic to other site ([place])     Beckey Rutter, [place], AGNP-C [place] at [facility] [phone] (clinic) 07/06/2021

## 2021-07-06 NOTE — Progress Notes (Signed)
Patient denies new problems/concerns today.   °

## 2021-07-06 NOTE — Patient Instructions (Signed)
[facility]   Discharge Instructions: Thank you for choosing [facility] to provide your oncology and hematology care.  If you have a lab appointment with the [facility], please go directly to the [facility] and check in at the registration area.  Wear comfortable clothing and clothing appropriate for easy access to any Portacath or PICC line.   We strive to give you quality time with your provider. You may need to reschedule your appointment if you arrive late (15 or more minutes).  Arriving late affects you and other patients whose appointments are after yours.  Also, if you miss three or more appointments without notifying the office, you may be dismissed from the clinic at the provider's discretion.      For prescription refill requests, have your pharmacy contact our office and allow 72 hours for refills to be completed.    Today you received the following chemotherapy and/or immunotherapy agents: Keytruda and Alimta. Today you also received the following: Zometa.      To help prevent nausea and vomiting after your treatment, we encourage you to take your nausea medication as directed.  BELOW ARE SYMPTOMS THAT SHOULD BE REPORTED IMMEDIATELY: *FEVER GREATER THAN 100.4 F (38 C) OR HIGHER *CHILLS OR SWEATING *NAUSEA AND VOMITING THAT IS NOT CONTROLLED WITH YOUR NAUSEA MEDICATION *UNUSUAL SHORTNESS OF BREATH *UNUSUAL BRUISING OR BLEEDING *URINARY PROBLEMS (pain or burning when urinating, or frequent urination) *BOWEL PROBLEMS (unusual diarrhea, constipation, pain near the anus) TENDERNESS IN MOUTH AND THROAT WITH OR WITHOUT PRESENCE OF ULCERS (sore throat, sores in mouth, or a toothache) UNUSUAL RASH, SWELLING OR PAIN  UNUSUAL VAGINAL DISCHARGE OR ITCHING   Items with * indicate a potential emergency and should be followed up as soon as possible or go to the Emergency Department if any problems should occur.  Please show the  CHEMOTHERAPY ALERT CARD or IMMUNOTHERAPY ALERT CARD at check-in to the Emergency Department and triage nurse.  Should you have questions after your visit or need to cancel or reschedule your appointment, please contact [facility]  [phone] and follow the prompts.  Office hours are 8:00 a.m. to 4:30 p.m. Monday - Friday. Please note that voicemails left after 4:00 p.m. may not be returned until the following business day.  We are closed weekends and major holidays. You have access to a nurse at all times for urgent questions. Please call the main number to the clinic [phone] and follow the prompts.  For any non-urgent questions, you may also contact your provider using MyChart. We now offer e-Visits for anyone 45 and older to request care online for non-urgent symptoms. For details visit [email or web address].   Also download the MyChart app! Go to the app store, search "MyChart", open the app, select [facility], and log in with your MyChart username and password.  Due to Covid, a mask is required upon entering the hospital/clinic. If you do not have a mask, one will be given to you upon arrival. For doctor visits, patients may have 1 support person aged 75 or older with them. For treatment visits, patients cannot have anyone with them due to current Covid guidelines and our immunocompromised population.

## 2021-07-17 ENCOUNTER — Other Ambulatory Visit: Payer: Self-pay | Admitting: Oncology

## 2021-07-27 ENCOUNTER — Other Ambulatory Visit: Payer: Self-pay

## 2021-07-27 ENCOUNTER — Inpatient Hospital Stay: Payer: 59

## 2021-07-27 ENCOUNTER — Inpatient Hospital Stay (HOSPITAL_BASED_OUTPATIENT_CLINIC_OR_DEPARTMENT_OTHER): Payer: 59 | Admitting: Oncology

## 2021-07-27 ENCOUNTER — Encounter: Payer: Self-pay | Admitting: Oncology

## 2021-07-27 ENCOUNTER — Inpatient Hospital Stay: Payer: 59 | Attending: Oncology

## 2021-07-27 VITALS — BP 110/81 | HR 73 | Temp 96.3°F | Resp 18 | Wt 217.2 lb

## 2021-07-27 DIAGNOSIS — R682 Dry mouth, unspecified: Secondary | ICD-10-CM | POA: Insufficient documentation

## 2021-07-27 DIAGNOSIS — C3490 Malignant neoplasm of unspecified part of unspecified bronchus or lung: Secondary | ICD-10-CM | POA: Insufficient documentation

## 2021-07-27 DIAGNOSIS — C3491 Malignant neoplasm of unspecified part of right bronchus or lung: Secondary | ICD-10-CM

## 2021-07-27 DIAGNOSIS — K219 Gastro-esophageal reflux disease without esophagitis: Secondary | ICD-10-CM | POA: Diagnosis not present

## 2021-07-27 DIAGNOSIS — Z5112 Encounter for antineoplastic immunotherapy: Secondary | ICD-10-CM | POA: Insufficient documentation

## 2021-07-27 DIAGNOSIS — Z5111 Encounter for antineoplastic chemotherapy: Secondary | ICD-10-CM

## 2021-07-27 DIAGNOSIS — G893 Neoplasm related pain (acute) (chronic): Secondary | ICD-10-CM | POA: Diagnosis not present

## 2021-07-27 DIAGNOSIS — E236 Other disorders of pituitary gland: Secondary | ICD-10-CM | POA: Diagnosis not present

## 2021-07-27 DIAGNOSIS — R519 Headache, unspecified: Secondary | ICD-10-CM | POA: Diagnosis not present

## 2021-07-27 DIAGNOSIS — D8989 Other specified disorders involving the immune mechanism, not elsewhere classified: Secondary | ICD-10-CM

## 2021-07-27 DIAGNOSIS — C7972 Secondary malignant neoplasm of left adrenal gland: Secondary | ICD-10-CM | POA: Insufficient documentation

## 2021-07-27 DIAGNOSIS — Z87891 Personal history of nicotine dependence: Secondary | ICD-10-CM | POA: Diagnosis not present

## 2021-07-27 LAB — COMPREHENSIVE METABOLIC PANEL
ALT: 27 U/L (ref 0–44)
AST: 24 U/L (ref 15–41)
Albumin: 3.8 g/dL (ref 3.5–5.0)
Alkaline Phosphatase: 77 U/L (ref 38–126)
Anion gap: 8 (ref 5–15)
BUN: 15 mg/dL (ref 6–20)
CO2: 26 mmol/L (ref 22–32)
Calcium: 8.6 mg/dL — ABNORMAL LOW (ref 8.9–10.3)
Chloride: 104 mmol/L (ref 98–111)
Creatinine, Ser: 0.72 mg/dL (ref 0.44–1.00)
GFR, Estimated: >60 mL/min (ref >60)
Glucose, Bld: 144 mg/dL — ABNORMAL HIGH (ref 70–99)
Potassium: 3.5 mmol/L (ref 3.5–5.1)
Sodium: 138 mmol/L (ref 135–145)
Total Bilirubin: 1 mg/dL (ref 0.3–1.2)
Total Protein: 7.1 g/dL (ref 6.5–8.1)

## 2021-07-27 LAB — CBC WITH DIFFERENTIAL/PLATELET
Abs Immature Granulocytes: 0.03 10*3/uL (ref 0.00–0.07)
Basophils Absolute: 0.1 10*3/uL (ref 0.0–0.1)
Basophils Relative: 1 %
Eosinophils Absolute: 0.2 10*3/uL (ref 0.0–0.5)
Eosinophils Relative: 3 %
HCT: 40.1 % (ref 36.0–46.0)
Hemoglobin: 13.3 g/dL (ref 12.0–15.0)
Immature Granulocytes: 1 %
Lymphocytes Relative: 30 %
Lymphs Abs: 1.8 10*3/uL (ref 0.7–4.0)
MCH: 30 pg (ref 26.0–34.0)
MCHC: 33.2 g/dL (ref 30.0–36.0)
MCV: 90.3 fL (ref 80.0–100.0)
Monocytes Absolute: 0.6 10*3/uL (ref 0.1–1.0)
Monocytes Relative: 10 %
Neutro Abs: 3.4 10*3/uL (ref 1.7–7.7)
Neutrophils Relative %: 55 %
Platelets: 232 10*3/uL (ref 150–400)
RBC: 4.44 MIL/uL (ref 3.87–5.11)
RDW: 14.7 % (ref 11.5–15.5)
WBC: 6 10*3/uL (ref 4.0–10.5)
nRBC: 0 % (ref 0.0–0.2)

## 2021-07-27 MED ORDER — SODIUM CHLORIDE 0.9 % IV SOLN
200.0000 mg | Freq: Once | INTRAVENOUS | Status: AC
  Administered 2021-07-27: 200 mg via INTRAVENOUS
  Filled 2021-07-27: qty 8

## 2021-07-27 MED ORDER — SODIUM CHLORIDE 0.9% FLUSH
10.0000 mL | Freq: Once | INTRAVENOUS | Status: AC
  Administered 2021-07-27: 10 mL via INTRAVENOUS
  Filled 2021-07-27: qty 10

## 2021-07-27 MED ORDER — PALONOSETRON HCL INJECTION 0.25 MG/5ML
0.2500 mg | Freq: Once | INTRAVENOUS | Status: AC
  Administered 2021-07-27: 0.25 mg via INTRAVENOUS
  Filled 2021-07-27: qty 5

## 2021-07-27 MED ORDER — SODIUM CHLORIDE 0.9 % IV SOLN
1000.0000 mg | Freq: Once | INTRAVENOUS | Status: AC
  Administered 2021-07-27: 1000 mg via INTRAVENOUS
  Filled 2021-07-27: qty 40

## 2021-07-27 MED ORDER — HEPARIN SOD (PORK) LOCK FLUSH 100 UNIT/ML IV SOLN
INTRAVENOUS | Status: AC
  Administered 2021-07-27: 500 [IU]
  Filled 2021-07-27: qty 5

## 2021-07-27 MED ORDER — HEPARIN SOD (PORK) LOCK FLUSH 100 UNIT/ML IV SOLN
500.0000 [IU] | Freq: Once | INTRAVENOUS | Status: AC | PRN
  Filled 2021-07-27: qty 5

## 2021-07-27 MED ORDER — SODIUM CHLORIDE 0.9 % IV SOLN
Freq: Once | INTRAVENOUS | Status: AC
  Filled 2021-07-27: qty 250

## 2021-07-27 NOTE — Patient Instructions (Signed)
[facility]  Discharge Instructions: Thank you for choosing [facility] to provide your oncology and hematology care.  If you have a lab appointment with the [facility], please go directly to the [facility] and check in at the registration area.  Wear comfortable clothing and clothing appropriate for easy access to any Portacath or PICC line.   We strive to give you quality time with your provider. You may need to reschedule your appointment if you arrive late (15 or more minutes).  Arriving late affects you and other patients whose appointments are after yours.  Also, if you miss three or more appointments without notifying the office, you may be dismissed from the clinic at the provider's discretion.      For prescription refill requests, have your pharmacy contact our office and allow 72 hours for refills to be completed.    Today you received the following chemotherapy and/or immunotherapy agents KEYTRUDA and ALIMTA      To help prevent nausea and vomiting after your treatment, we encourage you to take your nausea medication as directed.  BELOW ARE SYMPTOMS THAT SHOULD BE REPORTED IMMEDIATELY: *FEVER GREATER THAN 100.4 F (38 C) OR HIGHER *CHILLS OR SWEATING *NAUSEA AND VOMITING THAT IS NOT CONTROLLED WITH YOUR NAUSEA MEDICATION *UNUSUAL SHORTNESS OF BREATH *UNUSUAL BRUISING OR BLEEDING *URINARY PROBLEMS (pain or burning when urinating, or frequent urination) *BOWEL PROBLEMS (unusual diarrhea, constipation, pain near the anus) TENDERNESS IN MOUTH AND THROAT WITH OR WITHOUT PRESENCE OF ULCERS (sore throat, sores in mouth, or a toothache) UNUSUAL RASH, SWELLING OR PAIN  UNUSUAL VAGINAL DISCHARGE OR ITCHING   Items with * indicate a potential emergency and should be followed up as soon as possible or go to the Emergency Department if any problems should occur.  Please show the CHEMOTHERAPY ALERT CARD or IMMUNOTHERAPY ALERT CARD at  check-in to the Emergency Department and triage nurse.  Should you have questions after your visit or need to cancel or reschedule your appointment, please contact [facility]  [phone] and follow the prompts.  Office hours are 8:00 a.m. to 4:30 p.m. Monday - Friday. Please note that voicemails left after 4:00 p.m. may not be returned until the following business day.  We are closed weekends and major holidays. You have access to a nurse at all times for urgent questions. Please call the main number to the clinic [phone] and follow the prompts.  For any non-urgent questions, you may also contact your provider using MyChart. We now offer e-Visits for anyone 65 and older to request care online for non-urgent symptoms. For details visit [email or web address].   Also download the MyChart app! Go to the app store, search "MyChart", open the app, select , and log in with your MyChart username and password.  Due to Covid, a mask is required upon entering the hospital/clinic. If you do not have a mask, one will be given to you upon arrival. For doctor visits, patients may have 1 support person aged 25 or older with them. For treatment visits, patients cannot have anyone with them due to current Covid guidelines and our immunocompromised population.   Pemetrexed injection What is this medication? PEMETREXED (PEM e TREX ed) is a chemotherapy drug used to treat lung cancers like non-small cell lung cancer and mesothelioma. It may also be used to treat other cancers. This medicine may be used for other purposes; ask your health care provider or pharmacist if you  have questions. COMMON BRAND NAME(S): Alimta, PEMFEXY What should I tell my care team before I take this medication? They need to know if you have any of these conditions: infection (especially a virus infection such as chickenpox, cold sores, or herpes) kidney disease low blood counts, like  low white cell, platelet, or red cell counts lung or breathing disease, like asthma radiation therapy an unusual or allergic reaction to pemetrexed, other medicines, foods, dyes, or preservative pregnant or trying to get pregnant breast-feeding How should I use this medication? This drug is given as an infusion into a vein. It is administered in a hospital or clinic by a specially trained health care professional. Talk to your pediatrician regarding the use of this medicine in children. Special care may be needed. Overdosage: If you think you have taken too much of this medicine contact a poison control center or emergency room at once. NOTE: This medicine is only for you. Do not share this medicine with others. What if I miss a dose? It is important not to miss your dose. Call your doctor or health care professional if you are unable to keep an appointment. What may interact with this medication? This medicine may interact with the following medications: Ibuprofen This list may not describe all possible interactions. Give your health care provider a list of all the medicines, herbs, non-prescription drugs, or dietary supplements you use. Also tell them if you smoke, drink alcohol, or use illegal drugs. Some items may interact with your medicine. What should I watch for while using this medication? Visit your doctor for checks on your progress. This drug may make you feel generally unwell. This is not uncommon, as chemotherapy can affect healthy cells as well as cancer cells. Report any side effects. Continue your course of treatment even though you feel ill unless your doctor tells you to stop. In some cases, you may be given additional medicines to help with side effects. Follow all directions for their use. Call your doctor or health care professional for advice if you get a fever, chills or sore throat, or other symptoms of a cold or flu. Do not treat yourself. This drug decreases your body's  ability to fight infections. Try to avoid being around people who are sick. This medicine may increase your risk to bruise or bleed. Call your doctor or health care professional if you notice any unusual bleeding. Be careful brushing and flossing your teeth or using a toothpick because you may get an infection or bleed more easily. If you have any dental work done, tell your dentist you are receiving this medicine. Avoid taking products that contain aspirin, acetaminophen, ibuprofen, naproxen, or ketoprofen unless instructed by your doctor. These medicines may hide a fever. Call your doctor or health care professional if you get diarrhea or mouth sores. Do not treat yourself. To protect your kidneys, drink water or other fluids as directed while you are taking this medicine. Do not become pregnant while taking this medicine or for 6 months after stopping it. Women should inform their doctor if they wish to become pregnant or think they might be pregnant. Men should not father a child while taking this medicine and for 3 months after stopping it. This may interfere with the ability to father a child. You should talk to your doctor or health care professional if you are concerned about your fertility. There is a potential for serious side effects to an unborn child. Talk to your health care professional or  pharmacist for more information. Do not breast-feed an infant while taking this medicine or for 1 week after stopping it. What side effects may I notice from receiving this medication? Side effects that you should report to your doctor or health care professional as soon as possible: allergic reactions like skin rash, itching or hives, swelling of the face, lips, or tongue breathing problems redness, blistering, peeling or loosening of the skin, including inside the mouth signs and symptoms of bleeding such as bloody or black, tarry stools; red or dark-brown urine; spitting up blood or brown material  that looks like coffee grounds; red spots on the skin; unusual bruising or bleeding from the eye, gums, or nose signs and symptoms of infection like fever or chills; cough; sore throat; pain or trouble passing urine signs and symptoms of kidney injury like trouble passing urine or change in the amount of urine signs and symptoms of liver injury like dark yellow or brown urine; general ill feeling or flu-like symptoms; light-colored stools; loss of appetite; nausea; right upper belly pain; unusually weak or tired; yellowing of the eyes or skin Side effects that usually do not require medical attention (report to your doctor or health care professional if they continue or are bothersome): constipation mouth sores nausea, vomiting unusually weak or tired This list may not describe all possible side effects. Call your doctor for medical advice about side effects. You may report side effects to FDA at 1-800-FDA-1088. Where should I keep my medication? This drug is given in a hospital or clinic and will not be stored at home. NOTE: This sheet is a summary. It may not cover all possible information. If you have questions about this medicine, talk to your doctor, pharmacist, or health care provider.  2022 Elsevier/Gold Standard (2017-10-25 00:00:00)  Pembrolizumab injection What is this medication? PEMBROLIZUMAB (pem broe liz ue mab) is a monoclonal antibody. It is used to treat certain types of cancer. This medicine may be used for other purposes; ask your health care provider or pharmacist if you have questions. COMMON BRAND NAME(S): Keytruda What should I tell my care team before I take this medication? They need to know if you have any of these conditions: autoimmune diseases like Crohn's disease, ulcerative colitis, or lupus have had or planning to have an allogeneic stem cell transplant (uses someone else's stem cells) history of organ transplant history of chest radiation nervous system  problems like myasthenia gravis or Guillain-Barre syndrome an unusual or allergic reaction to pembrolizumab, other medicines, foods, dyes, or preservatives pregnant or trying to get pregnant breast-feeding How should I use this medication? This medicine is for infusion into a vein. It is given by a health care professional in a hospital or clinic setting. A special MedGuide will be given to you before each treatment. Be sure to read this information carefully each time. Talk to your pediatrician regarding the use of this medicine in children. While this drug may be prescribed for children as young as 6 months for selected conditions, precautions do apply. Overdosage: If you think you have taken too much of this medicine contact a poison control center or emergency room at once. NOTE: This medicine is only for you. Do not share this medicine with others. What if I miss a dose? It is important not to miss your dose. Call your doctor or health care professional if you are unable to keep an appointment. What may interact with this medication? Interactions have not been studied. This list may  not describe all possible interactions. Give your health care provider a list of all the medicines, herbs, non-prescription drugs, or dietary supplements you use. Also tell them if you smoke, drink alcohol, or use illegal drugs. Some items may interact with your medicine. What should I watch for while using this medication? Your condition will be monitored carefully while you are receiving this medicine. You may need blood work done while you are taking this medicine. Do not become pregnant while taking this medicine or for 4 months after stopping it. Women should inform their doctor if they wish to become pregnant or think they might be pregnant. There is a potential for serious side effects to an unborn child. Talk to your health care professional or pharmacist for more information. Do not breast-feed an infant  while taking this medicine or for 4 months after the last dose. What side effects may I notice from receiving this medication? Side effects that you should report to your doctor or health care professional as soon as possible: allergic reactions like skin rash, itching or hives, swelling of the face, lips, or tongue bloody or black, tarry breathing problems changes in vision chest pain chills confusion constipation cough diarrhea dizziness or feeling faint or lightheaded fast or irregular heartbeat fever flushing joint pain low blood counts - this medicine may decrease the number of white blood cells, red blood cells and platelets. You may be at increased risk for infections and bleeding. muscle pain muscle weakness pain, tingling, numbness in the hands or feet persistent headache redness, blistering, peeling or loosening of the skin, including inside the mouth signs and symptoms of high blood sugar such as dizziness; dry mouth; dry skin; fruity breath; nausea; stomach pain; increased hunger or thirst; increased urination signs and symptoms of kidney injury like trouble passing urine or change in the amount of urine signs and symptoms of liver injury like dark urine, light-colored stools, loss of appetite, nausea, right upper belly pain, yellowing of the eyes or skin sweating swollen lymph nodes weight loss Side effects that usually do not require medical attention (report to your doctor or health care professional if they continue or are bothersome): decreased appetite hair loss tiredness This list may not describe all possible side effects. Call your doctor for medical advice about side effects. You may report side effects to FDA at 1-800-FDA-1088. Where should I keep my medication? This drug is given in a hospital or clinic and will not be stored at home. NOTE: This sheet is a summary. It may not cover all possible information. If you have questions about this medicine, talk  to your doctor, pharmacist, or health care provider.  2022 Elsevier/Gold Standard (2021-05-19 00:00:00)

## 2021-07-27 NOTE — Progress Notes (Signed)
Per MD, ok to increase dose to 1032m Alimta. Will give B12 and Zometa and next infusion.

## 2021-07-27 NOTE — Progress Notes (Signed)
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  She underwent operative fixation on 10/12/2020.  MRI of femur showed heterogeneously enhancing osseous lesion within the area which would be nonspecific versus office neoplasm or metastatic lesion.  CT chest abdomen and pelvis with contrast showed an enlarged pretracheal lymph node 2.2 x 1.5 cm and a right paratracheal lymph node measuring 2.7 x 1.3 cm.  Prevascular node measuring 0.3 x 0.9 cm.  5 x 4 mm right middle lobe nodule.  2.8 x 1.9 cm left adrenal lesion.    Reamings from the right femur showed metastatic poorly differentiated carcinoma.  Immunohistochemistry showed was positive for pancytokeratin, CK7 and patchy CK20 with patchy dim expression of TTF-1.  Cells negative for Melan-A, CDX2, PAX8, Napsin A, GATA3, p40, CD56, p16 and thyroglobulin.  Findings compatible with metastatic carcinoma but because of decalcification immunohistochemical staining is unreliable.  Patchy dim staining with TTF-1 suspicious for lung primary but not a definitive diagnosis.   Repeat supraclavicular lymph node biopsy  showed metastatic adenocarcinoma.  Tumor cells positive for CK7 with focal weak staining for TTF-1.  Suggestive of lung origin in the proper clinical context.  Cells were negative for GATA3 PAX8 CDX2 and CK20 and Napsin A.  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  She is currently on hydrocortisone twice daily.  Thyroid functions presently are normal    Interval history-tolerating treatment well so far.  She is taking hydrocortisone twice daily.  Reports occasional fatigue.  Intermittent mild headaches which are self-limited.  Reports ongoing dry mouth for which she uses lozenges and hard candy  ECOG PS- 1 Pain scale- 0 Opioid associated constipation- no  Review of systems- Review of Systems  Constitutional:  Positive for malaise/fatigue. Negative for chills, fever and weight loss.  HENT:  Negative for congestion, ear discharge and nosebleeds.        Dry mouth  Eyes:  Negative for blurred vision.  Respiratory:  Negative for cough, hemoptysis, sputum production, shortness of breath and wheezing.   Cardiovascular:  Negative for chest pain, palpitations, orthopnea and claudication.  Gastrointestinal:  Negative for abdominal pain, blood in stool, constipation, diarrhea, heartburn, melena, nausea and vomiting.  Genitourinary:  Negative for dysuria, flank pain, frequency, hematuria and urgency.  Musculoskeletal:  Negative for back pain, joint pain and myalgias.  Skin:  Negative for rash.  Neurological:  Negative for dizziness, tingling, focal weakness, seizures, weakness and headaches.  Endo/Heme/Allergies:  Does not bruise/bleed easily.  Psychiatric/Behavioral:  Negative for depression and suicidal ideas. The patient does not have insomnia.     Allergies  Allergen Reactions  Factive [Gemifloxacin] Rash     Past Medical History:  Diagnosis Date    Abnormal Pap smear of cervix    Anxiety    Depression    Diverticulosis    Essential hypertension    Femur fracture, right (HCC)    GERD (gastroesophageal reflux disease)    Lung cancer (HCC)    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   Palpitations    Precordial chest pain    Wrist fracture 03/2021   left     Past Surgical History:  Procedure Laterality Date   BREAST BIOPSY Right 2017   benign   CERVICAL BIOPSY  W/ LOOP ELECTRODE EXCISION     COLONOSCOPY WITH PROPOFOL N/A 07/03/2015   Procedure: COLONOSCOPY WITH PROPOFOL;  Surgeon: Hulen Luster, MD;  Location: [facility] ENDOSCOPY;  Service: Gastroenterology;  Laterality: N/A;   ESOPHAGOGASTRODUODENOSCOPY (EGD) WITH PROPOFOL N/A 07/03/2015   Procedure: ESOPHAGOGASTRODUODENOSCOPY (EGD) WITH PROPOFOL;  Surgeon: Hulen Luster, MD;  Location: [facility] ENDOSCOPY;  Service: Gastroenterology;  Laterality: N/A;   INTRAMEDULLARY (IM) NAIL INTERTROCHANTERIC Right 09/15/2020   Procedure: INTRAMEDULLARY (IM) NAIL INTERTROCHANTRIC;  Surgeon: Corky Mull, MD;  Location: [facility] ORS;  Service: Orthopedics;  Laterality: Right;   IR IMAGING GUIDED PORT INSERTION  11/28/2020   LEFT HEART CATH AND CORONARY ANGIOGRAPHY N/A 03/28/2017   Procedure: Left Heart Cath and Coronary Angiography;  Surgeon: Lorretta Harp, MD;  Location: [place] CV LAB;  Service: Cardiovascular;  Laterality: N/A;   ORIF WRIST FRACTURE Left 03/31/2021   Procedure: OPEN REDUCTION INTERNAL FIXATION (ORIF) LEFT DISTAL RADIUS FRACTURE.;  Surgeon: Corky Mull, MD;  Location: [facility] ORS;  Service: Orthopedics;  Laterality: Left;    Social History   Socioeconomic History   Marital status: Married    Spouse name: Gene   Number of children: 2   Years of education: Not on file   Highest education level: Not on file  Occupational History   Not on file  Tobacco Use   Smoking status: Former    Packs/day: 1.00    Years: 30.00    Pack years: 30.00    Types: Cigarettes    Quit date: 09/17/2020     Years since quitting: 0.8   Smokeless tobacco: Never  Vaping Use   Vaping Use: Never used  Substance and Sexual Activity   Alcohol use: Not Currently    Alcohol/week: 1.0 standard drink    Types: 1 Standard drinks or equivalent per week    Comment: beer occ.   Drug use: No   Sexual activity: Yes    Birth control/protection: Post-menopausal  Other Topics Concern   Not on file  Social History Narrative   Lives in [place] with her husband.  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   Social Determinants of Health   Financial Resource Strain: Not on file  Food Insecurity: Not on file  Transportation Needs: Not on file  Physical Activity: Not on file  Stress: Not on file  Social Connections: Not on file  Intimate Partner Violence: Not on file    Family History  Problem Relation Age of Onset   Diabetes Mother        alive @ 79   Goiter Mother    Heart disease Father        died of MI @ 68   Osteoporosis Maternal Grandmother    Colon cancer Maternal Grandfather    Breast cancer Neg Hx  Ovarian cancer Neg Hx      Current Outpatient Medications:    acetaminophen (TYLENOL) 325 MG tablet, Take 1-2 tablets (325-650 mg total) by mouth every 6 (six) hours as needed for mild pain (pain score 1-3 or temp > 100.5)., Disp: , Rfl:    ALPRAZolam (XANAX) 0.5 MG tablet, TAKE 1 TABLET BY MOUTH DAILY AS NEEDED, Disp: 30 tablet, Rfl: 0   dexamethasone (DECADRON) 4 MG tablet, dexamethasone 4 mg tablet, Disp: , Rfl:    enoxaparin (LOVENOX) 40 MG/0.4ML injection, enoxaparin 40 mg/0.4 mL subcutaneous syringe (Patient not taking: Reported on 07/06/2021), Disp: , Rfl:    famotidine (PEPCID) 20 MG tablet, Take 20 mg by mouth in the morning., Disp: , Rfl:    folic acid (FOLVITE) 1 MG tablet, Take 1 tablet (1 mg total) by mouth daily., Disp: 90 tablet, Rfl: 1   hydrocortisone (CORTEF) 10 MG tablet, TAKE ONE TABLET AT BEDTIME, Disp: 30 tablet, Rfl: 1    hydrocortisone (CORTEF) 20 MG tablet, TAKE ONE TABLET EVERY MORNING, Disp: 30 tablet, Rfl: 1   lidocaine-prilocaine (EMLA) cream, Apply to affected area once (Patient taking differently: Apply 1 application topically as needed (prior to port being accessed).), Disp: 30 g, Rfl: 3   LORazepam (ATIVAN) 0.5 MG tablet, lorazepam 0.5 mg tablet (Patient not taking: Reported on 07/06/2021), Disp: , Rfl:    metoprolol tartrate (LOPRESSOR) 25 MG tablet, Take 25 mg by mouth in the morning., Disp: , Rfl:    nystatin (MYCOSTATIN) 100000 UNIT/ML suspension, nystatin 100,000 unit/mL oral suspension, Disp: , Rfl:    ondansetron (ZOFRAN) 4 MG tablet, Take 1 tablet (4 mg total) by mouth every 8 (eight) hours as needed for nausea or vomiting., Disp: 30 tablet, Rfl: 1   oxyCODONE (OXY IR/ROXICODONE) 5 MG immediate release tablet, TAKE 1 TO 2 TABLETS BY MOUTH EVERY 4 HOURS AS NEEDED FOR MODERATE PAIN, Disp: 60 tablet, Rfl: 0   Pembrolizumab (KEYTRUDA IV), Inject into the vein. Every 3 weeks, Disp: , Rfl:    PEMEtrexed Disodium (ALIMTA IV), Inject into the vein. Every 3 weeks, Disp: , Rfl:    polyethylene glycol (MIRALAX) 17 g packet, Take 17 g by mouth daily as needed for moderate constipation., Disp: 30 each, Rfl: 0   prochlorperazine (COMPAZINE) 10 MG tablet, Take 1 tablet (10 mg total) by mouth every 6 (six) hours as needed (Nausea or vomiting)., Disp: 30 tablet, Rfl: 1   sertraline (ZOLOFT) 50 MG tablet, sertraline 50 mg tablet, Disp: , Rfl:  No current facility-administered medications for this visit.  Facility-Administered Medications Ordered in Other Visits:    sodium chloride flush (NS) 0.9 % injection 10 mL, 10 mL, Intravenous, PRN, Sindy Guadeloupe, MD, 10 mL at 03/09/21 0906   Zoledronic Acid (ZOMETA) IVPB 4 mg, 4 mg, Intravenous, Q28 days, Sindy Guadeloupe, MD, Stopped at 11/21/20 1100  Physical exam:  Vitals:   07/27/21 0842  BP: 110/81  Pulse: 73  Resp: 18  Temp: (!) 96.3 F (35.7 C)  SpO2: 99%   Weight: 217 lb 3.2 oz (98.5 kg)   Physical Exam Cardiovascular:     Rate and Rhythm: Normal rate and regular rhythm.     Heart sounds: Normal heart sounds.  Pulmonary:     Effort: Pulmonary effort is normal.     Breath sounds: Normal breath sounds.  Skin:    General: Skin is warm and dry.  Neurological:     Mental Status: She is alert and oriented to person, place, and  time.     CMP Latest Ref Rng & Units 07/06/2021  Glucose 70 - 99 mg/dL 123(H)  BUN 6 - 20 mg/dL 16  Creatinine 0.44 - 1.00 mg/dL 0.87  Sodium 135 - 145 mmol/L 138  Potassium 3.5 - 5.1 mmol/L 3.5  Chloride 98 - 111 mmol/L 106  CO2 22 - 32 mmol/L 25  Calcium 8.9 - 10.3 mg/dL 8.8(L)  Total Protein 6.5 - 8.1 g/dL 6.8  Total Bilirubin 0.3 - 1.2 mg/dL 1.0  Alkaline Phos 38 - 126 U/L 67  AST 15 - 41 U/L 23  ALT 0 - 44 U/L 25   CBC Latest Ref Rng & Units 07/06/2021  WBC 4.0 - 10.5 K/uL 6.6  Hemoglobin 12.0 - 15.0 g/dL 13.2  Hematocrit 36.0 - 46.0 % 39.1  Platelets 150 - 400 K/uL 261     Assessment and plan- Patient is a 55 y.o. 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  She is here for on treatment assessment prior to cycle 8 of maintenance Alimta and Keytruda  Counts okay to proceed with cycle 8 of maintenance Alimta and Keytruda today.  I will see her back in 3 weeks for cycle 9 and she will receive a B12 shot and Zometa as well at that time.  Patient will need CT chest abdomen and pelvis with contrast and bone scan prior to next visit  Autoimmune hypophysitis: Continue hydrocortisone twice daily.  Thyroid functions are presently normal and therefore she is not on any thyroid replacement therapy.  We will repeat TSH and free T4 at next visit  Neoplasm related pain: Continue as needed oxycodone   Visit Diagnosis 1. Encounter for antineoplastic chemotherapy   2. Encounter for antineoplastic immunotherapy   3. Primary malignant neoplasm of lung metastatic to other site,  unspecified laterality ([place])   4. Neoplasm related pain   5. Autoimmune hypophysitis ([place])      Dr. Randa Evens, MD, MPH [facility] [phone] 07/27/2021 9:06 AM

## 2021-07-28 ENCOUNTER — Telehealth: Payer: Self-pay | Admitting: Oncology

## 2021-07-28 NOTE — Telephone Encounter (Signed)
Spoke with patient to let her know about delay in authorization for her Bone Scan. It has been moved from 11/17 to 11/28. Patient was agreeable. Reminded patient to pick up prep kit prior to her CT scan on 11/22.

## 2021-07-30 ENCOUNTER — Ambulatory Visit: Payer: 59

## 2021-07-31 ENCOUNTER — Other Ambulatory Visit: Payer: Self-pay | Admitting: Oncology

## 2021-08-04 ENCOUNTER — Ambulatory Visit (HOSPITAL_COMMUNITY)
Admission: RE | Admit: 2021-08-04 | Discharge: 2021-08-04 | Disposition: A | Discharge status: Home / Self Care | Payer: 59 | Source: Ambulatory Visit | Attending: Oncology | Admitting: Oncology

## 2021-08-04 ENCOUNTER — Encounter (HOSPITAL_COMMUNITY): Payer: Self-pay

## 2021-08-04 DIAGNOSIS — C3490 Malignant neoplasm of unspecified part of unspecified bronchus or lung: Secondary | ICD-10-CM | POA: Diagnosis not present

## 2021-08-04 MED ORDER — SODIUM CHLORIDE (PF) 0.9 % IJ SOLN
INTRAMUSCULAR | Status: AC
  Filled 2021-08-04: qty 50

## 2021-08-04 MED ORDER — IOHEXOL 350 MG/ML SOLN
75.0000 mL | Freq: Once | INTRAVENOUS | Status: AC | PRN
  Administered 2021-08-04: 75 mL via INTRAVENOUS

## 2021-08-10 ENCOUNTER — Ambulatory Visit
Admission: RE | Admit: 2021-08-10 | Discharge: 2021-08-10 | Disposition: A | Discharge status: Home / Self Care | Payer: 59 | Source: Ambulatory Visit | Attending: Oncology | Admitting: Oncology

## 2021-08-10 ENCOUNTER — Encounter
Admission: RE | Admit: 2021-08-10 | Discharge: 2021-08-10 | Disposition: A | Discharge status: Home / Self Care | Payer: 59 | Source: Ambulatory Visit | Attending: Oncology | Admitting: Oncology

## 2021-08-10 DIAGNOSIS — C3490 Malignant neoplasm of unspecified part of unspecified bronchus or lung: Secondary | ICD-10-CM | POA: Diagnosis present

## 2021-08-10 MED ORDER — TECHNETIUM TC 99M MEDRONATE IV KIT
20.0000 | PACK | Freq: Once | INTRAVENOUS | Status: AC | PRN
  Administered 2021-08-10: 10:00:00 21.62 via INTRAVENOUS

## 2021-08-17 ENCOUNTER — Ambulatory Visit: Payer: 59 | Admitting: Oncology

## 2021-08-17 ENCOUNTER — Inpatient Hospital Stay: Payer: 59 | Attending: Oncology

## 2021-08-17 ENCOUNTER — Other Ambulatory Visit: Payer: 59

## 2021-08-17 ENCOUNTER — Inpatient Hospital Stay: Payer: 59

## 2021-08-17 ENCOUNTER — Other Ambulatory Visit: Payer: Self-pay

## 2021-08-17 ENCOUNTER — Encounter: Payer: Self-pay | Admitting: Oncology

## 2021-08-17 ENCOUNTER — Ambulatory Visit: Payer: 59

## 2021-08-17 ENCOUNTER — Inpatient Hospital Stay (HOSPITAL_BASED_OUTPATIENT_CLINIC_OR_DEPARTMENT_OTHER): Payer: 59 | Admitting: Oncology

## 2021-08-17 VITALS — BP 124/91 | HR 71 | Temp 97.1°F | Resp 16 | Ht 66.0 in | Wt 220.0 lb

## 2021-08-17 DIAGNOSIS — Z5111 Encounter for antineoplastic chemotherapy: Secondary | ICD-10-CM

## 2021-08-17 DIAGNOSIS — C3491 Malignant neoplasm of unspecified part of right bronchus or lung: Secondary | ICD-10-CM | POA: Diagnosis not present

## 2021-08-17 DIAGNOSIS — F419 Anxiety disorder, unspecified: Secondary | ICD-10-CM | POA: Insufficient documentation

## 2021-08-17 DIAGNOSIS — K769 Liver disease, unspecified: Secondary | ICD-10-CM | POA: Diagnosis not present

## 2021-08-17 DIAGNOSIS — E2740 Unspecified adrenocortical insufficiency: Secondary | ICD-10-CM | POA: Diagnosis not present

## 2021-08-17 DIAGNOSIS — I10 Essential (primary) hypertension: Secondary | ICD-10-CM | POA: Diagnosis not present

## 2021-08-17 DIAGNOSIS — E785 Hyperlipidemia, unspecified: Secondary | ICD-10-CM | POA: Insufficient documentation

## 2021-08-17 DIAGNOSIS — C7972 Secondary malignant neoplasm of left adrenal gland: Secondary | ICD-10-CM | POA: Diagnosis not present

## 2021-08-17 DIAGNOSIS — C7951 Secondary malignant neoplasm of bone: Secondary | ICD-10-CM | POA: Diagnosis not present

## 2021-08-17 DIAGNOSIS — Z5112 Encounter for antineoplastic immunotherapy: Secondary | ICD-10-CM | POA: Diagnosis not present

## 2021-08-17 DIAGNOSIS — Z5181 Encounter for therapeutic drug level monitoring: Secondary | ICD-10-CM | POA: Diagnosis not present

## 2021-08-17 DIAGNOSIS — N200 Calculus of kidney: Secondary | ICD-10-CM | POA: Diagnosis not present

## 2021-08-17 DIAGNOSIS — M25559 Pain in unspecified hip: Secondary | ICD-10-CM | POA: Diagnosis not present

## 2021-08-17 DIAGNOSIS — Z79899 Other long term (current) drug therapy: Secondary | ICD-10-CM | POA: Insufficient documentation

## 2021-08-17 DIAGNOSIS — C3490 Malignant neoplasm of unspecified part of unspecified bronchus or lung: Secondary | ICD-10-CM

## 2021-08-17 DIAGNOSIS — K5730 Diverticulosis of large intestine without perforation or abscess without bleeding: Secondary | ICD-10-CM | POA: Diagnosis not present

## 2021-08-17 DIAGNOSIS — K219 Gastro-esophageal reflux disease without esophagitis: Secondary | ICD-10-CM | POA: Insufficient documentation

## 2021-08-17 DIAGNOSIS — Z7983 Long term (current) use of bisphosphonates: Secondary | ICD-10-CM

## 2021-08-17 DIAGNOSIS — Z87891 Personal history of nicotine dependence: Secondary | ICD-10-CM | POA: Diagnosis not present

## 2021-08-17 DIAGNOSIS — R5383 Other fatigue: Secondary | ICD-10-CM | POA: Insufficient documentation

## 2021-08-17 DIAGNOSIS — Z800 Family history of malignant neoplasm of digestive organs: Secondary | ICD-10-CM | POA: Insufficient documentation

## 2021-08-17 DIAGNOSIS — C779 Secondary and unspecified malignant neoplasm of lymph node, unspecified: Secondary | ICD-10-CM | POA: Insufficient documentation

## 2021-08-17 LAB — CBC WITH DIFFERENTIAL/PLATELET
Abs Immature Granulocytes: 0.02 10*3/uL (ref 0.00–0.07)
Basophils Absolute: 0.1 10*3/uL (ref 0.0–0.1)
Basophils Relative: 1 %
Eosinophils Absolute: 0.2 10*3/uL (ref 0.0–0.5)
Eosinophils Relative: 3 %
HCT: 40.6 % (ref 36.0–46.0)
Hemoglobin: 13.4 g/dL (ref 12.0–15.0)
Immature Granulocytes: 0 %
Lymphocytes Relative: 26 %
Lymphs Abs: 1.7 10*3/uL (ref 0.7–4.0)
MCH: 30.1 pg (ref 26.0–34.0)
MCHC: 33 g/dL (ref 30.0–36.0)
MCV: 91.2 fL (ref 80.0–100.0)
Monocytes Absolute: 0.6 10*3/uL (ref 0.1–1.0)
Monocytes Relative: 9 %
Neutro Abs: 4.1 10*3/uL (ref 1.7–7.7)
Neutrophils Relative %: 61 %
Platelets: 284 10*3/uL (ref 150–400)
RBC: 4.45 MIL/uL (ref 3.87–5.11)
RDW: 14.8 % (ref 11.5–15.5)
WBC: 6.7 10*3/uL (ref 4.0–10.5)
nRBC: 0 % (ref 0.0–0.2)

## 2021-08-17 LAB — COMPREHENSIVE METABOLIC PANEL
ALT: 26 U/L (ref 0–44)
AST: 24 U/L (ref 15–41)
Albumin: 3.9 g/dL (ref 3.5–5.0)
Alkaline Phosphatase: 72 U/L (ref 38–126)
Anion gap: 10 (ref 5–15)
BUN: 14 mg/dL (ref 6–20)
CO2: 24 mmol/L (ref 22–32)
Calcium: 8.5 mg/dL — ABNORMAL LOW (ref 8.9–10.3)
Chloride: 103 mmol/L (ref 98–111)
Creatinine, Ser: 0.75 mg/dL (ref 0.44–1.00)
GFR, Estimated: >60 mL/min (ref >60)
Glucose, Bld: 136 mg/dL — ABNORMAL HIGH (ref 70–99)
Potassium: 3.8 mmol/L (ref 3.5–5.1)
Sodium: 137 mmol/L (ref 135–145)
Total Bilirubin: 1.1 mg/dL (ref 0.3–1.2)
Total Protein: 6.9 g/dL (ref 6.5–8.1)

## 2021-08-17 LAB — T4, FREE: Free T4: 1.15 ng/dL — ABNORMAL HIGH (ref 0.61–1.12)

## 2021-08-17 LAB — TSH: TSH: 2.433 u[IU]/mL (ref 0.350–4.500)

## 2021-08-17 MED ORDER — HEPARIN SOD (PORK) LOCK FLUSH 100 UNIT/ML IV SOLN
500.0000 [IU] | Freq: Once | INTRAVENOUS | Status: DC
  Filled 2021-08-17: qty 5

## 2021-08-17 MED ORDER — HEPARIN SOD (PORK) LOCK FLUSH 100 UNIT/ML IV SOLN
500.0000 [IU] | Freq: Once | INTRAVENOUS | Status: AC | PRN
  Filled 2021-08-17: qty 5

## 2021-08-17 MED ORDER — PALONOSETRON HCL INJECTION 0.25 MG/5ML
0.2500 mg | Freq: Once | INTRAVENOUS | Status: AC
  Administered 2021-08-17: 0.25 mg via INTRAVENOUS
  Filled 2021-08-17: qty 5

## 2021-08-17 MED ORDER — ZOLEDRONIC ACID 4 MG/100ML IV SOLN
4.0000 mg | INTRAVENOUS | Status: DC
  Administered 2021-08-17: 4 mg via INTRAVENOUS
  Filled 2021-08-17: qty 100

## 2021-08-17 MED ORDER — SODIUM CHLORIDE 0.9 % IV SOLN
500.0000 mg/m2 | Freq: Once | INTRAVENOUS | Status: AC
  Administered 2021-08-17: 1000 mg via INTRAVENOUS
  Filled 2021-08-17: qty 40

## 2021-08-17 MED ORDER — CYANOCOBALAMIN 1000 MCG/ML IJ SOLN
1000.0000 ug | INTRAMUSCULAR | Status: DC
  Administered 2021-08-17: 1000 ug via INTRAMUSCULAR
  Filled 2021-08-17: qty 1

## 2021-08-17 MED ORDER — SODIUM CHLORIDE 0.9 % IV SOLN
Freq: Once | INTRAVENOUS | Status: AC
  Filled 2021-08-17: qty 250

## 2021-08-17 MED ORDER — HEPARIN SOD (PORK) LOCK FLUSH 100 UNIT/ML IV SOLN
INTRAVENOUS | Status: AC
  Administered 2021-08-17: 500 [IU]
  Filled 2021-08-17: qty 5

## 2021-08-17 MED ORDER — SODIUM CHLORIDE 0.9 % IV SOLN
200.0000 mg | Freq: Once | INTRAVENOUS | Status: AC
  Administered 2021-08-17: 200 mg via INTRAVENOUS
  Filled 2021-08-17: qty 8

## 2021-08-17 MED ORDER — SODIUM CHLORIDE 0.9% FLUSH
10.0000 mL | INTRAVENOUS | Status: DC | PRN
  Administered 2021-08-17: 10 mL via INTRAVENOUS
  Filled 2021-08-17: qty 10

## 2021-08-17 NOTE — Progress Notes (Signed)
Labs reviewed with MD. Per MD to proceed with treatment, Zometa, B12 shot today as scheduled.   Imberly Troxler [employer]

## 2021-08-17 NOTE — Patient Instructions (Signed)
[facility] CANCER [facility] AT [place]  Discharge Instructions: Thank you for choosing [facility] to provide your oncology and hematology care.  If you have a lab appointment with the [facility], please go directly to the [facility] and check in at the registration area.  Wear comfortable clothing and clothing appropriate for easy access to any Portacath or PICC line.   We strive to give you quality time with your provider. You may need to reschedule your appointment if you arrive late (15 or more minutes).  Arriving late affects you and other patients whose appointments are after yours.  Also, if you miss three or more appointments without notifying the office, you may be dismissed from the clinic at the provider's discretion.      For prescription refill requests, have your pharmacy contact our office and allow 72 hours for refills to be completed.    Today you received the following chemotherapy and/or immunotherapy agents keytruda & Alimta    To help prevent nausea and vomiting after your treatment, we encourage you to take your nausea medication as directed.  BELOW ARE SYMPTOMS THAT SHOULD BE REPORTED IMMEDIATELY: *FEVER GREATER THAN 100.4 F (38 C) OR HIGHER *CHILLS OR SWEATING *NAUSEA AND VOMITING THAT IS NOT CONTROLLED WITH YOUR NAUSEA MEDICATION *UNUSUAL SHORTNESS OF BREATH *UNUSUAL BRUISING OR BLEEDING *URINARY PROBLEMS (pain or burning when urinating, or frequent urination) *BOWEL PROBLEMS (unusual diarrhea, constipation, pain near the anus) TENDERNESS IN MOUTH AND THROAT WITH OR WITHOUT PRESENCE OF ULCERS (sore throat, sores in mouth, or a toothache) UNUSUAL RASH, SWELLING OR PAIN  UNUSUAL VAGINAL DISCHARGE OR ITCHING   Items with * indicate a potential emergency and should be followed up as soon as possible or go to the Emergency Department if any problems should occur.  Please show the CHEMOTHERAPY ALERT CARD or IMMUNOTHERAPY ALERT CARD at  check-in to the Emergency Department and triage nurse.  Should you have questions after your visit or need to cancel or reschedule your appointment, please contact [facility] CANCER [place] AT [place]  [phone] and follow the prompts.  Office hours are 8:00 a.m. to 4:30 p.m. Monday - Friday. Please note that voicemails left after 4:00 p.m. may not be returned until the following business day.  We are closed weekends and major holidays. You have access to a nurse at all times for urgent questions. Please call the main number to the clinic [phone] and follow the prompts.  For any non-urgent questions, you may also contact your provider using MyChart. We now offer e-Visits for anyone 57 and older to request care online for non-urgent symptoms. For details visit [email or web address].   Also download the MyChart app! Go to the app store, search "MyChart", open the app, select , and log in with your MyChart username and password.  Due to Covid, a mask is required upon entering the hospital/clinic. If you do not have a mask, one will be given to you upon arrival. For doctor visits, patients may have 1 support person aged 68 or older with them. For treatment visits, patients cannot have anyone with them due to current Covid guidelines and our immunocompromised population.

## 2021-08-17 NOTE — Progress Notes (Signed)
Orders received to adjust Alimta dose based on new BSA: 2.16 m2  Alimta 500 mg/m2 = 1000 mg  Received orders to adjust all future doses to this new BSA.  T.O. Dr Lars Masson, PharmD

## 2021-08-17 NOTE — Progress Notes (Signed)
Pt nervous about results today, a little nausea today but does not happen all the time

## 2021-08-17 NOTE — Progress Notes (Signed)
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  She underwent operative fixation on 10/12/2020.  MRI of femur showed heterogeneously enhancing osseous lesion within the area which would be nonspecific versus office neoplasm or metastatic lesion.  CT chest abdomen and pelvis with contrast showed an enlarged pretracheal lymph node 2.2 x 1.5 cm and a right paratracheal lymph node measuring 2.7 x 1.3 cm.  Prevascular node measuring 0.3 x 0.9 cm.  5 x 4 mm right middle lobe nodule.  2.8 x 1.9 cm left adrenal lesion.    Reamings from the right femur showed metastatic poorly differentiated carcinoma.  Immunohistochemistry showed was positive for pancytokeratin, CK7 and patchy CK20 with patchy dim expression of TTF-1.  Cells negative for Melan-A, CDX2, PAX8, Napsin A, GATA3, p40, CD56, p16 and thyroglobulin.  Findings compatible with metastatic carcinoma but because of decalcification immunohistochemical staining is unreliable.  Patchy dim staining with TTF-1 suspicious for lung primary but not a definitive diagnosis.   Repeat supraclavicular lymph node biopsy showed  metastatic adenocarcinoma.  Tumor cells positive for CK7 with focal weak staining for TTF-1.  Suggestive of lung origin in the proper clinical context.  Cells were negative for GATA3 PAX8 CDX2 and CK20 and Napsin A.  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  She is currently on hydrocortisone twice daily.  Thyroid functions presently are normal    Interval history-patient is tolerating treatment well.  Denies any specific side effects.  Hip pain is well controlled.  She has baseline fatigue  ECOG PS- 1 Pain scale- 0   Review of systems- Review of Systems  Constitutional:  Negative for chills, fever, malaise/fatigue and weight loss.  HENT:  Negative for congestion, ear discharge and nosebleeds.   Eyes:  Negative for blurred vision.  Respiratory:  Negative for cough, hemoptysis, sputum production, shortness of breath and wheezing.   Cardiovascular:  Negative for chest pain, palpitations, orthopnea and claudication.  Gastrointestinal:  Negative for abdominal pain, blood in stool, constipation, diarrhea, heartburn, melena, nausea and vomiting.  Genitourinary:  Negative for dysuria, flank pain, frequency, hematuria and urgency.  Musculoskeletal:  Negative for back pain, joint pain and myalgias.  Skin:  Negative for rash.  Neurological:  Negative for dizziness, tingling, focal weakness, seizures, weakness and headaches.  Endo/Heme/Allergies:  Does not bruise/bleed easily.  Psychiatric/Behavioral:  Negative for depression and suicidal ideas. The patient does not have insomnia.       Allergies  Allergen Reactions   Factive [Gemifloxacin] Rash     Past Medical History:  Diagnosis Date   Abnormal Pap smear of cervix    Anxiety  Depression    Diverticulosis    Essential hypertension    Femur fracture, right (HCC)    GERD (gastroesophageal reflux  disease)    Lung cancer (HCC)    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   Palpitations    Precordial chest pain    Wrist fracture 03/2021   left     Past Surgical History:  Procedure Laterality Date   BREAST BIOPSY Right 2017   benign   CERVICAL BIOPSY  W/ LOOP ELECTRODE EXCISION     COLONOSCOPY WITH PROPOFOL N/A 07/03/2015   Procedure: COLONOSCOPY WITH PROPOFOL;  Surgeon: Hulen Luster, MD;  Location: [facility] ENDOSCOPY;  Service: Gastroenterology;  Laterality: N/A;   ESOPHAGOGASTRODUODENOSCOPY (EGD) WITH PROPOFOL N/A 07/03/2015   Procedure: ESOPHAGOGASTRODUODENOSCOPY (EGD) WITH PROPOFOL;  Surgeon: Hulen Luster, MD;  Location: [facility] ENDOSCOPY;  Service: Gastroenterology;  Laterality: N/A;   INTRAMEDULLARY (IM) NAIL INTERTROCHANTERIC Right 09/15/2020   Procedure: INTRAMEDULLARY (IM) NAIL INTERTROCHANTRIC;  Surgeon: Corky Mull, MD;  Location: [facility] ORS;  Service: Orthopedics;  Laterality: Right;   IR IMAGING GUIDED PORT INSERTION  11/28/2020   LEFT HEART CATH AND CORONARY ANGIOGRAPHY N/A 03/28/2017   Procedure: Left Heart Cath and Coronary Angiography;  Surgeon: Lorretta Harp, MD;  Location: [facility];  Service: Cardiovascular;  Laterality: N/A;   ORIF WRIST FRACTURE Left 03/31/2021   Procedure: OPEN REDUCTION INTERNAL FIXATION (ORIF) LEFT DISTAL RADIUS FRACTURE.;  Surgeon: Corky Mull, MD;  Location: [facility] ORS;  Service: Orthopedics;  Laterality: Left;    Social History   Socioeconomic History   Marital status: Married    Spouse name: Gene   Number of children: 2   Years of education: Not on file   Highest education level: Not on file  Occupational History   Not on file  Tobacco Use   Smoking status: Former    Packs/day: 1.00    Years: 30.00    Pack years: 30.00    Types: Cigarettes    Quit date: 09/17/2020    Years since quitting: 0.9   Smokeless tobacco: Never  Vaping Use   Vaping Use: Never used  Substance and Sexual Activity   Alcohol use: Not Currently     Alcohol/week: 1.0 standard drink    Types: 1 Standard drinks or equivalent per week    Comment: beer occ.   Drug use: No   Sexual activity: Yes    Birth control/protection: Post-menopausal  Other Topics Concern   Not on file  Social History Narrative   Lives in [place] with her husband.  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   Social Determinants of Health   Financial Resource Strain: Not on file  Food Insecurity: Not on file  Transportation Needs: Not on file  Physical Activity: Not on file  Stress: Not on file  Social Connections: Not on file  Intimate Partner Violence: Not on file    Family History  Problem Relation Age of Onset   Diabetes Mother        alive @ 71   Goiter Mother    Heart disease Father        died of MI @ 80   Osteoporosis Maternal Grandmother    Colon cancer Maternal Grandfather    Breast cancer Neg Hx    Ovarian cancer Neg Hx      Current Outpatient Medications:    acetaminophen (TYLENOL) 325 MG tablet, Take 1-2 tablets (325-650 mg total)  by mouth every 6 (six) hours as needed for mild pain (pain score 1-3 or temp > 100.5)., Disp: , Rfl:    ALPRAZolam (XANAX) 0.5 MG tablet, TAKE 1 TABLET BY MOUTH DAILY AS NEEDED, Disp: 30 tablet, Rfl: 0   famotidine (PEPCID) 20 MG tablet, Take 20 mg by mouth in the morning., Disp: , Rfl:    folic acid (FOLVITE) 1 MG tablet, Take 1 tablet (1 mg total) by mouth daily., Disp: 90 tablet, Rfl: 1   hydrocortisone (CORTEF) 10 MG tablet, TAKE ONE TABLET AT BEDTIME, Disp: 30 tablet, Rfl: 1   hydrocortisone (CORTEF) 20 MG tablet, TAKE ONE TABLET EVERY MORNING, Disp: 30 tablet, Rfl: 1   lidocaine-prilocaine (EMLA) cream, Apply to affected area once (Patient taking differently: Apply 1 application topically as needed (prior to port being accessed).), Disp: 30 g, Rfl: 3   metoprolol tartrate (LOPRESSOR) 25 MG tablet, Take 25 mg by mouth in the morning., Disp: , Rfl:    ondansetron  (ZOFRAN) 4 MG tablet, Take 1 tablet (4 mg total) by mouth every 8 (eight) hours as needed for nausea or vomiting., Disp: 30 tablet, Rfl: 1   oxyCODONE (OXY IR/ROXICODONE) 5 MG immediate release tablet, TAKE 1 TO 2 TABLETS BY MOUTH EVERY 4 HOURS AS NEEDED FOR MODERATE PAIN, Disp: 60 tablet, Rfl: 0   Pembrolizumab (KEYTRUDA IV), Inject into the vein. Every 3 weeks, Disp: , Rfl:    PEMEtrexed Disodium (ALIMTA IV), Inject into the vein. Every 3 weeks, Disp: , Rfl:    polyethylene glycol (MIRALAX) 17 g packet, Take 17 g by mouth daily as needed for moderate constipation., Disp: 30 each, Rfl: 0   prochlorperazine (COMPAZINE) 10 MG tablet, Take 1 tablet (10 mg total) by mouth every 6 (six) hours as needed (Nausea or vomiting). (Patient not taking: Reported on 07/27/2021), Disp: 30 tablet, Rfl: 1 No current facility-administered medications for this visit.  Facility-Administered Medications Ordered in Other Visits:    cyanocobalamin ((VITAMIN B-12)) injection 1,000 mcg, 1,000 mcg, Intramuscular, Q28 days, Sindy Guadeloupe, MD, 1,000 mcg at 08/17/21 0954   sodium chloride flush (NS) 0.9 % injection 10 mL, 10 mL, Intravenous, PRN, Sindy Guadeloupe, MD, 10 mL at 03/09/21 0906   Zoledronic Acid (ZOMETA) IVPB 4 mg, 4 mg, Intravenous, Q28 days, Sindy Guadeloupe, MD, Stopped at 11/21/20 1100   Zoledronic Acid (ZOMETA) IVPB 4 mg, 4 mg, Intravenous, Q28 days, Sindy Guadeloupe, MD, Stopped at 08/17/21 1008  Physical exam:  Vitals:   08/17/21 0853  BP: (!) 124/91  Pulse: 71  Resp: 16  Temp: (!) 97.1 F (36.2 C)  TempSrc: Tympanic  Weight: 220 lb (99.8 kg)  Height: 5' 6"  (1.676 m)   Physical Exam Constitutional:      General: She is not in acute distress. Cardiovascular:     Rate and Rhythm: Normal rate and regular rhythm.     Heart sounds: Normal heart sounds.  Pulmonary:     Effort: Pulmonary effort is normal.     Breath sounds: Normal breath sounds.  Skin:    General: Skin is warm and dry.  Neurological:      Mental Status: She is alert and oriented to person, place, and time.     CMP Latest Ref Rng & Units 08/17/2021  Glucose 70 - 99 mg/dL 136(H)  BUN 6 - 20 mg/dL 14  Creatinine 0.44 - 1.00 mg/dL 0.75  Sodium 135 - 145 mmol/L 137  Potassium 3.5 - 5.1 mmol/L 3.8  Chloride  98 - 111 mmol/L 103  CO2 22 - 32 mmol/L 24  Calcium 8.9 - 10.3 mg/dL 8.5(L)  Total Protein 6.5 - 8.1 g/dL 6.9  Total Bilirubin 0.3 - 1.2 mg/dL 1.1  Alkaline Phos 38 - 126 U/L 72  AST 15 - 41 U/L 24  ALT 0 - 44 U/L 26   CBC Latest Ref Rng & Units 08/17/2021  WBC 4.0 - 10.5 K/uL 6.7  Hemoglobin 12.0 - 15.0 g/dL 13.4  Hematocrit 36.0 - 46.0 % 40.6  Platelets 150 - 400 K/uL 284    No images are attached to the encounter.  NM Bone Scan Whole Body  Result Date: 08/11/2021 CLINICAL DATA:  Lung cancer with osseous metastatic disease, January 2022 pathologic fracture of the proximal right femoral shaft, and a known metastasis in the anterior left iliac crest. EXAM: NUCLEAR MEDICINE WHOLE BODY BONE SCAN TECHNIQUE: Whole body anterior and posterior images were obtained approximately 3 hours after intravenous injection of radiopharmaceutical. RADIOPHARMACEUTICALS:  25.0 mCi Technetium-40mMDP IV COMPARISON:  The whole-body bone scan dated 04/15/2021, chest abdomen and pelvis CTs dated 01/16/2021 and 04/15/2021, MRI right femur 09/15/2020. FINDINGS: There is stable mild increased metastatic uptake in the posterior right skull at the parieto-occipital junction. No interval change is seen in intense uptake in the anterior left iliac crest due to the known metastasis or similar activity in the proximal and distal right femoral shaft with known pathologic fracture of the proximal right femoral shaft with ORIF treatment. Activity consistent with degenerative change is noted in the shoulders, knees and mid feet, and somewhat in the spine. Remainder of the visualized skeletal activity appears physiologic. Soft tissue, renal and bladder  activity are unremarkable. IMPRESSION: Sites of abnormal activity in the posterior right parieto-occipital junction, anterior left iliac crest and proximal and distal right femur are unchanged. No new abnormality on bone scintigraphy. Electronically Signed   By: KTelford NabM.D.   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 EXAM: CT CHEST, ABDOMEN, AND PELVIS WITH CONTRAST TECHNIQUE: Multidetector CT imaging of the chest, abdomen and pelvis was performed following the standard protocol during bolus administration of intravenous contrast. CONTRAST:  751mOMNIPAQUE IOHEXOL 350 MG/ML SOLN COMPARISON:  CT chest 01/16/2021 CT chest 04/15/2021 CT chest 09/15/2020 FINDINGS: CT CHEST FINDINGS Lines and tubes: Right chest wall Port-A-Cath with tip terminating in the right atrium. Cardiovascular: Normal heart size. No significant pericardial effusion. The thoracic aorta is normal in caliber. No atherosclerotic plaque of the thoracic aorta. Two vessel coronary artery calcifications. Mediastinum/Nodes: Stable 0.8 cm right paratracheal lymph node. No enlarged mediastinal, hilar, or axillary lymph nodes. Stable 1.2 and 0.8 cm right thyroid gland nodules. Trachea and esophagus demonstrate no significant findings. Lungs/Pleura: No focal consolidation. Stable couple of vague pulmonary micronodules (6:99, 104). No pulmonary mass. No pleural effusion. No pneumothorax. Musculoskeletal: No chest wall abnormality. No suspicious lytic or blastic osseous lesions. No acute displaced fracture. Multilevel degenerative changes of the spine. Possible T6 and T7 vertebral body hemangioma. CT ABDOMEN PELVIS FINDINGS  Hepatobiliary: Stable 1.2 cm right hepatic lobe lesion. No new focal liver abnormality. Diffusely decreased hepatic parenchyma density compared to the spleen. No gallstones, gallbladder wall thickening, or pericholecystic fluid. No biliary dilatation. Pancreas: No  focal lesion. Normal pancreatic contour. No surrounding inflammatory changes. No main pancreatic ductal dilatation. Spleen: Normal in size without focal abnormality. Adrenals/Urinary Tract: No right adrenal gland nodule. Stable 2.4 x 0.8 cm left adrenal gland nodule. Bilateral kidneys enhance symmetrically. Persistent punctate right nephrolithiasis. No hydronephrosis. No hydroureter. The urinary bladder is unremarkable. Stomach/Bowel: Stomach is within normal limits. No evidence of bowel wall thickening or dilatation. Diffuse colonic diverticulosis. Stool throughout colon. Appendix appears normal. Vascular/Lymphatic: No abdominal aorta or iliac aneurysm. Mild atherosclerotic plaque of the aorta and its branches. No abdominal, pelvic, or inguinal lymphadenopathy. Reproductive: Uterus and bilateral adnexa are unremarkable. Other: No intraperitoneal free fluid. No intraperitoneal free gas. No organized fluid collection. Musculoskeletal: No abdominal wall hernia or abnormality. Redemonstration of a sclerotic metastasis of the left iliac wing 2:1). Pseudoarthrosis at the L5-S1 level. Likely healed left sacral ala fracture. Partially visualized proximal right femur surgical hardware fixation with surrounding lytic and blastic lesion consistent with known prior fracture. No acute displaced fracture. Multilevel degenerative changes of the spine. Grade 1 anterolisthesis of L3 on L4. IMPRESSION: 1. Stable couple right middle lobe micronodules, stable prominent but nonenlarged mediastinal lymph nodes, and stable left adrenal metastasis in a patient with known prior metastatic lung cancer. 2. Grossly similar-appearing left iliac metastasis and partially visualized  proximal right femur surgical hardware fixation with surrounding lytic and blastic lesion consistent with known pathologic fracture. 3. Other imaging findings of potential clinical significance: Hepatic steatosis. Nonobstructive punctate right nephrolithiasis. Diffuse colonic diverticulosis with no acute diverticulitis. Aortic Atherosclerosis (ICD10-I70.0) -including 2 vessel coronary calcifications. Electronically Signed   By: Iven Finn M.D.   On: 08/04/2021 21:56     Assessment and plan- Patient is a 56 y.o. 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  She is here for on treatment assessment prior to cycle 9 of maintenance Alimta and Keytruda  I have reviewed CT chest abdomen and pelvis images independently as well as bone scan and explained the results to the patient and her husband in detail.  Overall scan showed evidence of stable disease.  Improved as compared to her initial PET CT scan.  Adrenal metastases as well as bone mets are stable.  Right middle lobe lung nodule is subcentimeters.  Mediastinal lymphadenopathy has plan is to continue Alimta and Keytruda until progression or toxicity.  Counts otherwise okay to proceed with Alimta and Keytruda today.  She will also receive her B12 injection today.  Calcium 8.5 and okay to receive Zometa today.  Patient will be seen in 4 weeks instead of 3 weeks as she wants to take a break for Christmas and I will see her on 09/15/21 for cycle 10 of maintenance Alimta and Keytruda     Visit Diagnosis 1. Primary malignant neoplasm of lung metastatic to other site, unspecified laterality ([place])   2. Encounter for antineoplastic chemotherapy   3. Encounter for monoclonal antibody treatment for malignancy      Dr. Randa Evens, MD, MPH [facility] [phone] 08/17/2021 11:58 AM

## 2021-08-18 ENCOUNTER — Other Ambulatory Visit: Payer: Self-pay | Admitting: Oncology

## 2021-08-18 ENCOUNTER — Other Ambulatory Visit: Payer: Self-pay | Admitting: Nurse Practitioner

## 2021-08-18 LAB — T4: T4, Total: 9 ug/dL (ref 4.5–12.0)

## 2021-08-27 ENCOUNTER — Telehealth: Payer: Self-pay | Admitting: *Deleted

## 2021-08-27 NOTE — Telephone Encounter (Signed)
Pt called in to ask if could get additional tissue removed from recent skin cancer found by dermatologist. Advised pt that having additional skin tissue removed by dermatologist should be okay at this time. Also, pt was wondering if she could take magnesium supplement at night to help her stay asleep and reduce muscle cramps. Advised pt that this should be okay as well and recommended to not exceed 330m magnesium daily. Pt verbalized understanding. Nothing further needed at this time.

## 2021-08-30 ENCOUNTER — Other Ambulatory Visit: Payer: Self-pay

## 2021-09-10 ENCOUNTER — Encounter: Payer: Self-pay | Admitting: Oncology

## 2021-09-11 ENCOUNTER — Other Ambulatory Visit: Payer: Self-pay | Admitting: Oncology

## 2021-09-15 ENCOUNTER — Ambulatory Visit: Payer: 59 | Admitting: Oncology

## 2021-09-15 ENCOUNTER — Encounter: Payer: Self-pay | Admitting: Oncology

## 2021-09-15 ENCOUNTER — Ambulatory Visit: Payer: 59

## 2021-09-15 ENCOUNTER — Inpatient Hospital Stay: Payer: 59

## 2021-09-15 ENCOUNTER — Other Ambulatory Visit: Payer: 59

## 2021-09-15 ENCOUNTER — Inpatient Hospital Stay: Payer: 59 | Admitting: Oncology

## 2021-09-22 ENCOUNTER — Encounter: Payer: Self-pay | Admitting: Oncology

## 2021-09-22 ENCOUNTER — Other Ambulatory Visit: Payer: Self-pay

## 2021-09-22 ENCOUNTER — Inpatient Hospital Stay: Payer: 59

## 2021-09-22 ENCOUNTER — Inpatient Hospital Stay (HOSPITAL_BASED_OUTPATIENT_CLINIC_OR_DEPARTMENT_OTHER): Payer: 59 | Admitting: Oncology

## 2021-09-22 ENCOUNTER — Inpatient Hospital Stay: Payer: 59 | Attending: Oncology

## 2021-09-22 VITALS — BP 125/87 | HR 74 | Temp 96.5°F | Resp 16 | Ht 66.0 in | Wt 219.9 lb

## 2021-09-22 DIAGNOSIS — C7970 Secondary malignant neoplasm of unspecified adrenal gland: Secondary | ICD-10-CM | POA: Insufficient documentation

## 2021-09-22 DIAGNOSIS — Z5111 Encounter for antineoplastic chemotherapy: Secondary | ICD-10-CM

## 2021-09-22 DIAGNOSIS — I10 Essential (primary) hypertension: Secondary | ICD-10-CM | POA: Insufficient documentation

## 2021-09-22 DIAGNOSIS — G893 Neoplasm related pain (acute) (chronic): Secondary | ICD-10-CM | POA: Insufficient documentation

## 2021-09-22 DIAGNOSIS — C7951 Secondary malignant neoplasm of bone: Secondary | ICD-10-CM | POA: Insufficient documentation

## 2021-09-22 DIAGNOSIS — C3491 Malignant neoplasm of unspecified part of right bronchus or lung: Secondary | ICD-10-CM | POA: Diagnosis not present

## 2021-09-22 DIAGNOSIS — Z5112 Encounter for antineoplastic immunotherapy: Secondary | ICD-10-CM

## 2021-09-22 DIAGNOSIS — E2740 Unspecified adrenocortical insufficiency: Secondary | ICD-10-CM | POA: Diagnosis not present

## 2021-09-22 DIAGNOSIS — Z79899 Other long term (current) drug therapy: Secondary | ICD-10-CM | POA: Diagnosis not present

## 2021-09-22 DIAGNOSIS — E785 Hyperlipidemia, unspecified: Secondary | ICD-10-CM | POA: Diagnosis not present

## 2021-09-22 LAB — CBC WITH DIFFERENTIAL/PLATELET
Abs Immature Granulocytes: 0.05 10*3/uL (ref 0.00–0.07)
Basophils Absolute: 0.1 10*3/uL (ref 0.0–0.1)
Basophils Relative: 1 %
Eosinophils Absolute: 0.2 10*3/uL (ref 0.0–0.5)
Eosinophils Relative: 3 %
HCT: 42.1 % (ref 36.0–46.0)
Hemoglobin: 14 g/dL (ref 12.0–15.0)
Immature Granulocytes: 1 %
Lymphocytes Relative: 28 %
Lymphs Abs: 2.2 10*3/uL (ref 0.7–4.0)
MCH: 29.9 pg (ref 26.0–34.0)
MCHC: 33.3 g/dL (ref 30.0–36.0)
MCV: 90 fL (ref 80.0–100.0)
Monocytes Absolute: 0.7 10*3/uL (ref 0.1–1.0)
Monocytes Relative: 9 %
Neutro Abs: 4.5 10*3/uL (ref 1.7–7.7)
Neutrophils Relative %: 58 %
Platelets: 267 10*3/uL (ref 150–400)
RBC: 4.68 MIL/uL (ref 3.87–5.11)
RDW: 13.7 % (ref 11.5–15.5)
WBC: 7.7 10*3/uL (ref 4.0–10.5)
nRBC: 0 % (ref 0.0–0.2)

## 2021-09-22 LAB — COMPREHENSIVE METABOLIC PANEL
ALT: 22 U/L (ref 0–44)
AST: 20 U/L (ref 15–41)
Albumin: 3.6 g/dL (ref 3.5–5.0)
Alkaline Phosphatase: 76 U/L (ref 38–126)
Anion gap: 5 (ref 5–15)
BUN: 17 mg/dL (ref 6–20)
CO2: 24 mmol/L (ref 22–32)
Calcium: 8.3 mg/dL — ABNORMAL LOW (ref 8.9–10.3)
Chloride: 106 mmol/L (ref 98–111)
Creatinine, Ser: 0.68 mg/dL (ref 0.44–1.00)
GFR, Estimated: >60 mL/min (ref >60)
Glucose, Bld: 114 mg/dL — ABNORMAL HIGH (ref 70–99)
Potassium: 3.7 mmol/L (ref 3.5–5.1)
Sodium: 135 mmol/L (ref 135–145)
Total Bilirubin: 0.6 mg/dL (ref 0.3–1.2)
Total Protein: 7.3 g/dL (ref 6.5–8.1)

## 2021-09-22 MED ORDER — PALONOSETRON HCL INJECTION 0.25 MG/5ML: 0.2500 mg | Freq: Once | INTRAVENOUS | Status: DC

## 2021-09-22 MED ORDER — SODIUM CHLORIDE 0.9 % IV SOLN
Freq: Once | INTRAVENOUS | Status: AC
  Filled 2021-09-22: qty 250

## 2021-09-22 MED ORDER — HEPARIN SOD (PORK) LOCK FLUSH 100 UNIT/ML IV SOLN
500.0000 [IU] | Freq: Once | INTRAVENOUS | Status: AC | PRN
  Administered 2021-09-22: 500 [IU]
  Filled 2021-09-22: qty 5

## 2021-09-22 MED ORDER — SODIUM CHLORIDE 0.9 % IV SOLN
500.0000 mg/m2 | Freq: Once | INTRAVENOUS | Status: AC
  Administered 2021-09-22: 1000 mg via INTRAVENOUS
  Filled 2021-09-22: qty 40

## 2021-09-22 MED ORDER — HEPARIN SOD (PORK) LOCK FLUSH 100 UNIT/ML IV SOLN
INTRAVENOUS | Status: AC
  Filled 2021-09-22: qty 5

## 2021-09-22 MED ORDER — ONDANSETRON HCL 4 MG/2ML IJ SOLN
4.0000 mg | Freq: Once | INTRAMUSCULAR | Status: AC
  Administered 2021-09-22: 4 mg via INTRAVENOUS
  Filled 2021-09-22: qty 2

## 2021-09-22 MED ORDER — SODIUM CHLORIDE 0.9 % IV SOLN
200.0000 mg | Freq: Once | INTRAVENOUS | Status: AC
  Administered 2021-09-22: 200 mg via INTRAVENOUS
  Filled 2021-09-22: qty 8

## 2021-09-22 NOTE — Progress Notes (Signed)
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  She underwent operative fixation on 10/12/2020.  MRI of femur showed heterogeneously enhancing osseous lesion within the area which would be nonspecific versus office neoplasm or metastatic lesion.  CT chest abdomen and pelvis with contrast showed an enlarged pretracheal lymph node 2.2 x 1.5 cm and a right paratracheal lymph node measuring 2.7 x 1.3 cm.  Prevascular node measuring 0.3 x 0.9 cm.  5 x 4 mm right middle lobe nodule.  2.8 x 1.9 cm left adrenal lesion.    Reamings from the right femur showed metastatic poorly differentiated carcinoma.  Immunohistochemistry showed was positive for pancytokeratin, CK7 and patchy CK20 with patchy dim expression of TTF-1.  Cells negative for Melan-A, CDX2, PAX8, Napsin A, GATA3, p40, CD56, p16 and thyroglobulin.  Findings compatible with metastatic carcinoma but because of decalcification immunohistochemical staining is unreliable.  Patchy dim staining with TTF-1 suspicious for lung primary but not a definitive diagnosis.   Repeat supraclavicular lymph node biopsy showed  metastatic adenocarcinoma.  Tumor cells positive for CK7 with focal weak staining for TTF-1.  Suggestive of lung origin in the proper clinical context.  Cells were negative for GATA3 PAX8 CDX2 and CK20 and Napsin A.  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  She is currently on hydrocortisone twice daily.  Thyroid functions presently are normal    Interval history-patient has mild self-limited low back pain which comes and goes.  Denies other complaints at this time.  She is concerned about her weight gain over the last 7 to 8 months.  ECOG PS- 1 Pain scale- 2 Opioid associated constipation- no  Review of systems- Review of Systems  Constitutional:  Negative for chills, fever, malaise/fatigue and weight loss.       Weight gain  HENT:  Negative for congestion, ear discharge and nosebleeds.   Eyes:  Negative for blurred vision.  Respiratory:  Negative for cough, hemoptysis, sputum production, shortness of breath and wheezing.   Cardiovascular:  Negative for chest pain, palpitations, orthopnea and claudication.  Gastrointestinal:  Negative for abdominal pain, blood in stool, constipation, diarrhea, heartburn, melena, nausea and vomiting.  Genitourinary:  Negative for dysuria, flank pain, frequency, hematuria and urgency.  Musculoskeletal:  Positive for back pain. Negative for joint pain and myalgias.  Skin:  Negative for rash.  Neurological:  Negative for dizziness, tingling, focal weakness, seizures, weakness and headaches.  Endo/Heme/Allergies:  Does not bruise/bleed easily.  Psychiatric/Behavioral:  Negative for depression and suicidal ideas. The patient does not have insomnia.      Allergies  Allergen Reactions   Factive [Gemifloxacin] Rash  Past Medical History:  Diagnosis Date   Abnormal Pap smear of cervix    Anxiety    Depression     Diverticulosis    Essential hypertension    Femur fracture, right (HCC)    GERD (gastroesophageal reflux disease)    Lung cancer (HCC)    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   Palpitations    Precordial chest pain    Wrist fracture 03/2021   left     Past Surgical History:  Procedure Laterality Date   BREAST BIOPSY Right 2017   benign   CERVICAL BIOPSY  W/ LOOP ELECTRODE EXCISION     COLONOSCOPY WITH PROPOFOL N/A 07/03/2015   Procedure: COLONOSCOPY WITH PROPOFOL;  Surgeon: Hulen Luster, MD;  [facility] ENDOSCOPY;  Service: Gastroenterology;  Laterality: N/A;   ESOPHAGOGASTRODUODENOSCOPY (EGD) WITH PROPOFOL N/A 07/03/2015   Procedure: ESOPHAGOGASTRODUODENOSCOPY (EGD) WITH PROPOFOL;  Surgeon: Hulen Luster, MD;  Location: [facility] ENDOSCOPY;  Service: Gastroenterology;  Laterality: N/A;   INTRAMEDULLARY (IM) NAIL INTERTROCHANTERIC Right 09/15/2020   Procedure: INTRAMEDULLARY (IM) NAIL INTERTROCHANTRIC;  Surgeon: Corky Mull, MD;  Location: [facility] ORS;  Service: Orthopedics;  Laterality: Right;   IR IMAGING GUIDED PORT INSERTION  11/28/2020   LEFT HEART CATH AND CORONARY ANGIOGRAPHY N/A 03/28/2017   Procedure: Left Heart Cath and Coronary Angiography;  Surgeon: Lorretta Harp, MD;  Location: [facility] CV LAB;  Service: Cardiovascular;  Laterality: N/A;   ORIF WRIST FRACTURE Left 03/31/2021   Procedure: OPEN REDUCTION INTERNAL FIXATION (ORIF) LEFT DISTAL RADIUS FRACTURE.;  Surgeon: Corky Mull, MD;  Location: [facility] ORS;  Service: Orthopedics;  Laterality: Left;    Social History   Socioeconomic History   Marital status: Married    Spouse name: Gene   Number of children: 2   Years of education: Not on file   Highest education level: Not on file  Occupational History   Not on file  Tobacco Use   Smoking status: Former    Packs/day: 1.00    Years: 30.00    Pack years: 30.00    Types: Cigarettes    Quit date: 09/17/2020    Years since quitting: 1.0   Smokeless tobacco: Never   Vaping Use   Vaping Use: Never used  Substance and Sexual Activity   Alcohol use: Not Currently    Alcohol/week: 1.0 standard drink    Types: 1 Standard drinks or equivalent per week    Comment: beer occ.   Drug use: No   Sexual activity: Yes    Birth control/protection: Post-menopausal  Other Topics Concern   Not on file  Social History Narrative   Lives in [place] with her husband.  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   Social Determinants of Health   Financial Resource Strain: Not on file  Food Insecurity: Not on file  Transportation Needs: Not on file  Physical Activity: Not on file  Stress: Not on file  Social Connections: Not on file  Intimate Partner Violence: Not on file    Family History  Problem Relation Age of Onset   Diabetes Mother        alive @ 62   Goiter Mother    Heart disease Father        died of MI @ 65   Osteoporosis Maternal Grandmother    Colon cancer Maternal Grandfather    Breast cancer Neg Hx    Ovarian cancer Neg Hx  Current Outpatient Medications:    acetaminophen (TYLENOL) 325 MG tablet, Take 1-2 tablets (325-650 mg total) by mouth every 6 (six) hours as needed for mild pain (pain score 1-3 or temp > 100.5)., Disp: , Rfl:    ALPRAZolam (XANAX) 0.5 MG tablet, TAKE 1 TABLET BY MOUTH DAILY AS NEEDED, Disp: 30 tablet, Rfl: 0   famotidine (PEPCID) 20 MG tablet, Take 20 mg by mouth in the morning., Disp: , Rfl:    folic acid (FOLVITE) 1 MG tablet, Take 1 tablet (1 mg total) by mouth daily., Disp: 90 tablet, Rfl: 1   hydrocortisone (CORTEF) 10 MG tablet, TAKE 1 TABLET BY MOUTH AT BEDTIME, Disp: 30 tablet, Rfl: 1   hydrocortisone (CORTEF) 20 MG tablet, TAKE 1 TABLET BY MOUTH EVERY MORNING, Disp: 30 tablet, Rfl: 1   lidocaine-prilocaine (EMLA) cream, Apply to affected area once (Patient taking differently: Apply 1 application topically as needed (prior to port being accessed).), Disp: 30 g,  Rfl: 3   metoprolol tartrate (LOPRESSOR) 25 MG tablet, Take 25 mg by mouth in the morning., Disp: , Rfl:    ondansetron (ZOFRAN) 4 MG tablet, Take 1 tablet (4 mg total) by mouth every 8 (eight) hours as needed for nausea or vomiting., Disp: 30 tablet, Rfl: 1   oxyCODONE (OXY IR/ROXICODONE) 5 MG immediate release tablet, TAKE 1 TO 2 TABLETS BY MOUTH EVERY 4 HOURS AS NEEDED FOR MODERATE PAIN, Disp: 60 tablet, Rfl: 0   Pembrolizumab (KEYTRUDA IV), Inject into the vein. Every 3 weeks, Disp: , Rfl:    PEMEtrexed Disodium (ALIMTA IV), Inject into the vein. Every 3 weeks, Disp: , Rfl:    polyethylene glycol (MIRALAX) 17 g packet, Take 17 g by mouth daily as needed for moderate constipation., Disp: 30 each, Rfl: 0   prochlorperazine (COMPAZINE) 10 MG tablet, Take 1 tablet (10 mg total) by mouth every 6 (six) hours as needed (Nausea or vomiting)., Disp: 30 tablet, Rfl: 1 No current facility-administered medications for this visit.  Facility-Administered Medications Ordered in Other Visits:    heparin lock flush 100 UNIT/ML injection, , , ,    heparin lock flush 100 unit/mL, 500 Units, Intracatheter, Once PRN, Sindy Guadeloupe, MD   pembrolizumab [facility]) 200 mg in sodium chloride 0.9 % 50 mL chemo infusion, 200 mg, Intravenous, Once, Sindy Guadeloupe, MD, Last Rate: 116 mL/hr at 09/22/21 1009, 200 mg at 09/22/21 1009   PEMEtrexed (ALIMTA) 1,000 mg in sodium chloride 0.9 % 100 mL chemo infusion, 500 mg/m2 (Treatment Plan Recorded), Intravenous, Once, Sindy Guadeloupe, MD   sodium chloride flush (NS) 0.9 % injection 10 mL, 10 mL, Intravenous, PRN, Sindy Guadeloupe, MD, 10 mL at 03/09/21 0906   Zoledronic Acid (ZOMETA) IVPB 4 mg, 4 mg, Intravenous, Q28 days, Sindy Guadeloupe, MD, Stopped at 11/21/20 1100  Physical exam:  Vitals:   09/22/21 0857  BP: 125/87  Pulse: 74  Resp: 16  Temp: (!) 96.5 F (35.8 C)  TempSrc: Tympanic  SpO2: 97%  Weight: 219 lb 14.4 oz (99.7 kg)  Height: _0  (1.676 m)   Physical  Exam Cardiovascular:     Rate and Rhythm: Normal rate and regular rhythm.     Heart sounds: Normal heart sounds.  Pulmonary:     Effort: Pulmonary effort is normal.  Abdominal:     Palpations: Abdomen is soft.  Skin:    General: Skin is warm and dry.  Neurological:     Mental Status: She is alert and oriented  to person, place, and time.     CMP Latest Ref Rng & Units 09/22/2021  Glucose 70 - 99 mg/dL 114(H)  BUN 6 - 20 mg/dL 17  Creatinine 0.44 - 1.00 mg/dL 0.68  Sodium 135 - 145 mmol/L 135  Potassium 3.5 - 5.1 mmol/L 3.7  Chloride 98 - 111 mmol/L 106  CO2 22 - 32 mmol/L 24  Calcium 8.9 - 10.3 mg/dL 8.3(L)  Total Protein 6.5 - 8.1 g/dL 7.3  Total Bilirubin 0.3 - 1.2 mg/dL 0.6  Alkaline Phos 38 - 126 U/L 76  AST 15 - 41 U/L 20  ALT 0 - 44 U/L 22   CBC Latest Ref Rng & Units 09/22/2021  WBC 4.0 - 10.5 K/uL 7.7  Hemoglobin 12.0 - 15.0 g/dL 14.0  Hematocrit 36.0 - 46.0 % 42.1  Platelets 150 - 400 K/uL 267     Assessment and plan- Patient is a 56 y.o. 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  She is here for on treatment assessment prior to cycle 11 of maintenance Alimta and Keytruda  Counseled to proceed with cycle 11 of maintenance Alimta and Keytruda today.  Serum calcium is 8.3 and therefore I will hold off on giving her Zometa today.  I will see her back in 3 weeks for cycle 12.  Plan to repeat scans next month.  Neoplasm related pain: Patient uses as needed oxycodone sparingly  Autoimmune hypophysitis: On hydrocortisone replacement.  Thyroid functions are presently normal   Visit Diagnosis 1. Encounter for antineoplastic chemotherapy   2. Encounter for antineoplastic immunotherapy   3. Primary malignant neoplasm of right lung metastatic to other site [facility])      Dr. Randa Evens, MD, MPH [facility] [phone] 09/22/2021 10:15 AM

## 2021-09-22 NOTE — Patient Instructions (Signed)
[facility] [place]  Discharge Instructions: Thank you for choosing [facility] to provide your oncology and hematology care.  If you have a lab appointment with the [facility], please go directly to the [facility] and check in at the registration area.  Wear comfortable clothing and clothing appropriate for easy access to any Portacath or PICC line.   We strive to give you quality time with your provider. You may need to reschedule your appointment if you arrive late (15 or more minutes).  Arriving late affects you and other patients whose appointments are after yours.  Also, if you miss three or more appointments without notifying the office, you may be dismissed from the clinic at the providers discretion.      For prescription refill requests, have your pharmacy contact our office and allow 72 hours for refills to be completed.    Today you received the following chemotherapy and/or immunotherapy agents Keytruda and Alimta        To help prevent nausea and vomiting after your treatment, we encourage you to take your nausea medication as directed.  BELOW ARE SYMPTOMS THAT SHOULD BE REPORTED IMMEDIATELY: *FEVER GREATER THAN 100.4 F (38 C) OR HIGHER *CHILLS OR SWEATING *NAUSEA AND VOMITING THAT IS NOT CONTROLLED WITH YOUR NAUSEA MEDICATION *UNUSUAL SHORTNESS OF BREATH *UNUSUAL BRUISING OR BLEEDING *URINARY PROBLEMS (pain or burning when urinating, or frequent urination) *BOWEL PROBLEMS (unusual diarrhea, constipation, pain near the anus) TENDERNESS IN MOUTH AND THROAT WITH OR WITHOUT PRESENCE OF ULCERS (sore throat, sores in mouth, or a toothache) UNUSUAL RASH, SWELLING OR PAIN  UNUSUAL VAGINAL DISCHARGE OR ITCHING   Items with * indicate a potential emergency and should be followed up as soon as possible or go to the Emergency Department if any problems should occur.  Please show the CHEMOTHERAPY ALERT CARD or IMMUNOTHERAPY ALERT CARD at  check-in to the Emergency Department and triage nurse.  Should you have questions after your visit or need to cancel or reschedule your appointment, please contact [facility] [place] [facility] [place]  [phone] and follow the prompts.  Office hours are 8:00 a.m. to 4:30 p.m. Monday - Friday. Please note that voicemails left after 4:00 p.m. may not be returned until the following business day.  We are closed weekends and major holidays. You have access to a nurse at all times for urgent questions. Please call the main number to the clinic [phone] and follow the prompts.  For any non-urgent questions, you may also contact your provider using MyChart. We now offer e-Visits for anyone 47 and older to request care online for non-urgent symptoms. For details visit [email or web address].   Also download the MyChart app! Go to the app store, search "MyChart", open the app, select [facility], and log in with your MyChart username and password.  Due to Covid, a mask is required upon entering the hospital/clinic. If you do not have a mask, one will be given to you upon arrival. For doctor visits, patients may have 1 support person aged 1 or older with them. For treatment visits, patients cannot have anyone with them due to current Covid guidelines and our immunocompromised population.

## 2021-09-22 NOTE — Progress Notes (Signed)
Calcium 8.3, Per Dr. Janese Banks hold Zometa.

## 2021-09-25 DIAGNOSIS — D0462 Carcinoma in situ of skin of left upper limb, including shoulder: Secondary | ICD-10-CM | POA: Diagnosis not present

## 2021-09-29 ENCOUNTER — Other Ambulatory Visit: Payer: Self-pay | Admitting: Oncology

## 2021-10-13 ENCOUNTER — Ambulatory Visit: Payer: 59

## 2021-10-13 ENCOUNTER — Other Ambulatory Visit: Payer: 59

## 2021-10-13 ENCOUNTER — Ambulatory Visit: Payer: 59 | Admitting: Oncology

## 2021-10-14 ENCOUNTER — Inpatient Hospital Stay (HOSPITAL_BASED_OUTPATIENT_CLINIC_OR_DEPARTMENT_OTHER): Payer: 59 | Admitting: Oncology

## 2021-10-14 ENCOUNTER — Encounter: Payer: Self-pay | Admitting: Oncology

## 2021-10-14 ENCOUNTER — Other Ambulatory Visit: Payer: Self-pay

## 2021-10-14 ENCOUNTER — Inpatient Hospital Stay: Payer: 59 | Attending: Oncology

## 2021-10-14 ENCOUNTER — Inpatient Hospital Stay: Payer: 59

## 2021-10-14 VITALS — BP 136/92 | HR 81 | Temp 97.2°F | Resp 16 | Ht 66.0 in | Wt 225.4 lb

## 2021-10-14 DIAGNOSIS — F419 Anxiety disorder, unspecified: Secondary | ICD-10-CM | POA: Diagnosis not present

## 2021-10-14 DIAGNOSIS — Z5112 Encounter for antineoplastic immunotherapy: Secondary | ICD-10-CM | POA: Insufficient documentation

## 2021-10-14 DIAGNOSIS — Z5111 Encounter for antineoplastic chemotherapy: Secondary | ICD-10-CM | POA: Insufficient documentation

## 2021-10-14 DIAGNOSIS — C7951 Secondary malignant neoplasm of bone: Secondary | ICD-10-CM | POA: Insufficient documentation

## 2021-10-14 DIAGNOSIS — D8989 Other specified disorders involving the immune mechanism, not elsewhere classified: Secondary | ICD-10-CM | POA: Diagnosis not present

## 2021-10-14 DIAGNOSIS — R69 Illness, unspecified: Secondary | ICD-10-CM | POA: Diagnosis not present

## 2021-10-14 DIAGNOSIS — Z803 Family history of malignant neoplasm of breast: Secondary | ICD-10-CM | POA: Insufficient documentation

## 2021-10-14 DIAGNOSIS — E236 Other disorders of pituitary gland: Secondary | ICD-10-CM

## 2021-10-14 DIAGNOSIS — E2740 Unspecified adrenocortical insufficiency: Secondary | ICD-10-CM | POA: Diagnosis not present

## 2021-10-14 DIAGNOSIS — C3491 Malignant neoplasm of unspecified part of right bronchus or lung: Secondary | ICD-10-CM

## 2021-10-14 DIAGNOSIS — G893 Neoplasm related pain (acute) (chronic): Secondary | ICD-10-CM | POA: Diagnosis not present

## 2021-10-14 DIAGNOSIS — K219 Gastro-esophageal reflux disease without esophagitis: Secondary | ICD-10-CM | POA: Diagnosis not present

## 2021-10-14 DIAGNOSIS — Z87891 Personal history of nicotine dependence: Secondary | ICD-10-CM | POA: Insufficient documentation

## 2021-10-14 DIAGNOSIS — Z79899 Other long term (current) drug therapy: Secondary | ICD-10-CM | POA: Diagnosis not present

## 2021-10-14 DIAGNOSIS — I10 Essential (primary) hypertension: Secondary | ICD-10-CM | POA: Diagnosis not present

## 2021-10-14 LAB — COMPREHENSIVE METABOLIC PANEL
ALT: 24 U/L (ref 0–44)
AST: 24 U/L (ref 15–41)
Albumin: 3.6 g/dL (ref 3.5–5.0)
Alkaline Phosphatase: 78 U/L (ref 38–126)
Anion gap: 9 (ref 5–15)
BUN: 16 mg/dL (ref 6–20)
CO2: 23 mmol/L (ref 22–32)
Calcium: 8.7 mg/dL — ABNORMAL LOW (ref 8.9–10.3)
Chloride: 104 mmol/L (ref 98–111)
Creatinine, Ser: 0.83 mg/dL (ref 0.44–1.00)
GFR, Estimated: >60 mL/min (ref >60)
Glucose, Bld: 139 mg/dL — ABNORMAL HIGH (ref 70–99)
Potassium: 3.6 mmol/L (ref 3.5–5.1)
Sodium: 136 mmol/L (ref 135–145)
Total Bilirubin: 0.4 mg/dL (ref 0.3–1.2)
Total Protein: 6.9 g/dL (ref 6.5–8.1)

## 2021-10-14 LAB — CBC WITH DIFFERENTIAL/PLATELET
Abs Immature Granulocytes: 0.03 10*3/uL (ref 0.00–0.07)
Basophils Absolute: 0.1 10*3/uL (ref 0.0–0.1)
Basophils Relative: 1 %
Eosinophils Absolute: 0.2 10*3/uL (ref 0.0–0.5)
Eosinophils Relative: 2 %
HCT: 39.7 % (ref 36.0–46.0)
Hemoglobin: 13.2 g/dL (ref 12.0–15.0)
Immature Granulocytes: 0 %
Lymphocytes Relative: 19 %
Lymphs Abs: 1.5 10*3/uL (ref 0.7–4.0)
MCH: 29.9 pg (ref 26.0–34.0)
MCHC: 33.2 g/dL (ref 30.0–36.0)
MCV: 89.8 fL (ref 80.0–100.0)
Monocytes Absolute: 0.7 10*3/uL (ref 0.1–1.0)
Monocytes Relative: 9 %
Neutro Abs: 5.5 10*3/uL (ref 1.7–7.7)
Neutrophils Relative %: 69 %
Platelets: 264 10*3/uL (ref 150–400)
RBC: 4.42 MIL/uL (ref 3.87–5.11)
RDW: 13.9 % (ref 11.5–15.5)
WBC: 7.9 10*3/uL (ref 4.0–10.5)
nRBC: 0 % (ref 0.0–0.2)

## 2021-10-14 MED ORDER — ZOLEDRONIC ACID 4 MG/100ML IV SOLN
4.0000 mg | INTRAVENOUS | Status: DC
  Administered 2021-10-14: 4 mg via INTRAVENOUS
  Filled 2021-10-14: qty 100

## 2021-10-14 MED ORDER — SODIUM CHLORIDE 0.9 % IV SOLN
500.0000 mg/m2 | Freq: Once | INTRAVENOUS | Status: AC
  Administered 2021-10-14: 1000 mg via INTRAVENOUS
  Filled 2021-10-14: qty 40

## 2021-10-14 MED ORDER — ONDANSETRON HCL 4 MG/2ML IJ SOLN
4.0000 mg | Freq: Once | INTRAMUSCULAR | Status: AC
  Administered 2021-10-14: 4 mg via INTRAVENOUS
  Filled 2021-10-14: qty 2

## 2021-10-14 MED ORDER — SODIUM CHLORIDE 0.9% FLUSH
10.0000 mL | Freq: Once | INTRAVENOUS | Status: AC
  Administered 2021-10-14: 10 mL via INTRAVENOUS
  Filled 2021-10-14: qty 10

## 2021-10-14 MED ORDER — SODIUM CHLORIDE 0.9 % IV SOLN
Freq: Once | INTRAVENOUS | Status: AC
  Filled 2021-10-14: qty 250

## 2021-10-14 MED ORDER — SODIUM CHLORIDE 0.9 % IV SOLN
200.0000 mg | Freq: Once | INTRAVENOUS | Status: AC
  Administered 2021-10-14: 200 mg via INTRAVENOUS
  Filled 2021-10-14: qty 200

## 2021-10-14 MED ORDER — HEPARIN SOD (PORK) LOCK FLUSH 100 UNIT/ML IV SOLN
500.0000 [IU] | Freq: Once | INTRAVENOUS | Status: AC
  Administered 2021-10-14: 500 [IU] via INTRAVENOUS
  Filled 2021-10-14: qty 5

## 2021-10-14 NOTE — Patient Instructions (Signed)
[facility] [place]  Discharge Instructions: Thank you for choosing [facility] to provide your oncology and hematology care.  If you have a lab appointment with the [facility], please go directly to the [facility] and check in at the registration area.  Wear comfortable clothing and clothing appropriate for easy access to any Portacath or PICC line.   We strive to give you quality time with your provider. You may need to reschedule your appointment if you arrive late (15 or more minutes).  Arriving late affects you and other patients whose appointments are after yours.  Also, if you miss three or more appointments without notifying the office, you may be dismissed from the clinic at the providers discretion.      For prescription refill requests, have your pharmacy contact our office and allow 72 hours for refills to be completed.    Today you received the following chemotherapy and/or immunotherapy agents: Zometa, Alimta, Keytruda      To help prevent nausea and vomiting after your treatment, we encourage you to take your nausea medication as directed.  BELOW ARE SYMPTOMS THAT SHOULD BE REPORTED IMMEDIATELY: *FEVER GREATER THAN 100.4 F (38 C) OR HIGHER *CHILLS OR SWEATING *NAUSEA AND VOMITING THAT IS NOT CONTROLLED WITH YOUR NAUSEA MEDICATION *UNUSUAL SHORTNESS OF BREATH *UNUSUAL BRUISING OR BLEEDING *URINARY PROBLEMS (pain or burning when urinating, or frequent urination) *BOWEL PROBLEMS (unusual diarrhea, constipation, pain near the anus) TENDERNESS IN MOUTH AND THROAT WITH OR WITHOUT PRESENCE OF ULCERS (sore throat, sores in mouth, or a toothache) UNUSUAL RASH, SWELLING OR PAIN  UNUSUAL VAGINAL DISCHARGE OR ITCHING   Items with * indicate a potential emergency and should be followed up as soon as possible or go to the Emergency Department if any problems should occur.  Please show the CHEMOTHERAPY ALERT CARD or IMMUNOTHERAPY ALERT CARD  at check-in to the Emergency Department and triage nurse.  Should you have questions after your visit or need to cancel or reschedule your appointment, please contact [facility] [place] [facility] [place]  [phone] and follow the prompts.  Office hours are 8:00 a.m. to 4:30 p.m. Monday - Friday. Please note that voicemails left after 4:00 p.m. may not be returned until the following business day.  We are closed weekends and major holidays. You have access to a nurse at all times for urgent questions. Please call the main number to the clinic [phone] and follow the prompts.  For any non-urgent questions, you may also contact your provider using MyChart. We now offer e-Visits for anyone 2 and older to request care online for non-urgent symptoms. For details visit [email or web address].   Also download the MyChart app! Go to the app store, search "MyChart", open the app, select [facility], and log in with your MyChart username and password.  Due to Covid, a mask is required upon entering the hospital/clinic. If you do not have a mask, one will be given to you upon arrival. For doctor visits, patients may have 1 support person aged 26 or older with them. For treatment visits, patients cannot have anyone with them due to current Covid guidelines and our immunocompromised population. Zoledronic Acid Injection (Hypercalcemia, Oncology) What is this medication? ZOLEDRONIC ACID (ZOE le dron ik AS id) slows calcium loss from bones. It high calcium levels in the blood from some kinds of cancer. It may be used in other people at risk for bone loss. This medicine may be used for other purposes; ask  your health care provider or pharmacist if you have questions. COMMON BRAND NAME(S): Zometa What should I tell my care team before I take this medication? They need to know if you have any of these conditions: cancer dehydration dental disease kidney disease liver disease low levels of calcium  in the blood lung or breathing disease (asthma) receiving steroids like dexamethasone or prednisone an unusual or allergic reaction to zoledronic acid, other medicines, foods, dyes, or preservatives pregnant or trying to get pregnant breast-feeding How should I use this medication? This drug is injected into a vein. It is given by a health care provider in a hospital or clinic setting. Talk to your health care provider about the use of this drug in children. Special care may be needed. Overdosage: If you think you have taken too much of this medicine contact a poison control center or emergency room at once. NOTE: This medicine is only for you. Do not share this medicine with others. What if I miss a dose? Keep appointments for follow-up doses. It is important not to miss your dose. Call your health care provider if you are unable to keep an appointment. What may interact with this medication? certain antibiotics given by injection NSAIDs, medicines for pain and inflammation, like ibuprofen or naproxen some diuretics like bumetanide, furosemide teriparatide thalidomide This list may not describe all possible interactions. Give your health care provider a list of all the medicines, herbs, non-prescription drugs, or dietary supplements you use. Also tell them if you smoke, drink alcohol, or use illegal drugs. Some items may interact with your medicine. What should I watch for while using this medication? Visit your health care provider for regular checks on your progress. It may be some time before you see the benefit from this drug. Some people who take this drug have severe bone, joint, or muscle pain. This drug may also increase your risk for jaw problems or a broken thigh bone. Tell your health care provider right away if you have severe pain in your jaw, bones, joints, or muscles. Tell you health care provider if you have any pain that does not go away or that gets worse. Tell your dentist  and dental surgeon that you are taking this drug. You should not have major dental surgery while on this drug. See your dentist to have a dental exam and fix any dental problems before starting this drug. Take good care of your teeth while on this drug. Make sure you see your dentist for regular follow-up appointments. You should make sure you get enough calcium and vitamin D while you are taking this drug. Discuss the foods you eat and the vitamins you take with your health care provider. Check with your health care provider if you have severe diarrhea, nausea, and vomiting, or if you sweat a lot. The loss of too much body fluid may make it dangerous for you to take this drug. You may need blood work done while you are taking this drug. Do not become pregnant while taking this drug. Women should inform their health care provider if they wish to become pregnant or think they might be pregnant. There is potential for serious harm to an unborn child. Talk to your health care provider for more information. What side effects may I notice from receiving this medication? Side effects that you should report to your doctor or health care provider as soon as possible: allergic reactions (skin rash, itching or hives; swelling of the face, lips, or  tongue) bone pain infection (fever, chills, cough, sore throat, pain or trouble passing urine) jaw pain, especially after dental work joint pain kidney injury (trouble passing urine or change in the amount of urine) low blood pressure (dizziness; feeling faint or lightheaded, falls; unusually weak or tired) low calcium levels (fast heartbeat; muscle cramps or pain; pain, tingling, or numbness in the hands or feet; seizures) low magnesium levels (fast, irregular heartbeat; muscle cramp or pain; muscle weakness; tremors; seizures) low red blood cell counts (trouble breathing; feeling faint; lightheaded, falls; unusually weak or tired) muscle pain redness, blistering,  peeling, or loosening of the skin, including inside the mouth severe diarrhea swelling of the ankles, feet, hands trouble breathing Side effects that usually do not require medical attention (report to your doctor or health care provider if they continue or are bothersome): anxious constipation coughing depressed mood eye irritation, itching, or pain fever general ill feeling or flu-like symptoms nausea pain, redness, or irritation at site where injected trouble sleeping This list may not describe all possible side effects. Call your doctor for medical advice about side effects. You may report side effects to FDA at 1-800-FDA-1088. Where should I keep my medication? This drug is given in a hospital or clinic. It will not be stored at home. NOTE: This sheet is a summary. It may not cover all possible information. If you have questions about this medicine, talk to your doctor, pharmacist, or health care provider.  2022 Elsevier/Gold Standard (2021-05-19 00:00:00) Pembrolizumab injection What is this medication? PEMBROLIZUMAB (pem broe liz ue mab) is a monoclonal antibody. It is used to treat certain types of cancer. This medicine may be used for other purposes; ask your health care provider or pharmacist if you have questions. COMMON BRAND NAME(S): Keytruda What should I tell my care team before I take this medication? They need to know if you have any of these conditions: autoimmune diseases like Crohn's disease, ulcerative colitis, or lupus have had or planning to have an allogeneic stem cell transplant (uses someone else's stem cells) history of organ transplant history of chest radiation nervous system problems like myasthenia gravis or Guillain-Barre syndrome an unusual or allergic reaction to pembrolizumab, other medicines, foods, dyes, or preservatives pregnant or trying to get pregnant breast-feeding How should I use this medication? This medicine is for infusion into a  vein. It is given by a health care professional in a hospital or clinic setting. A special MedGuide will be given to you before each treatment. Be sure to read this information carefully each time. Talk to your pediatrician regarding the use of this medicine in children. While this drug may be prescribed for children as young as 6 months for selected conditions, precautions do apply. Overdosage: If you think you have taken too much of this medicine contact a poison control center or emergency room at once. NOTE: This medicine is only for you. Do not share this medicine with others. What if I miss a dose? It is important not to miss your dose. Call your doctor or health care professional if you are unable to keep an appointment. What may interact with this medication? Interactions have not been studied. This list may not describe all possible interactions. Give your health care provider a list of all the medicines, herbs, non-prescription drugs, or dietary supplements you use. Also tell them if you smoke, drink alcohol, or use illegal drugs. Some items may interact with your medicine. What should I watch for while using this medication? Your  condition will be monitored carefully while you are receiving this medicine. You may need blood work done while you are taking this medicine. Do not become pregnant while taking this medicine or for 4 months after stopping it. Women should inform their doctor if they wish to become pregnant or think they might be pregnant. There is a potential for serious side effects to an unborn child. Talk to your health care professional or pharmacist for more information. Do not breast-feed an infant while taking this medicine or for 4 months after the last dose. What side effects may I notice from receiving this medication? Side effects that you should report to your doctor or health care professional as soon as possible: allergic reactions like skin rash, itching or hives,  swelling of the face, lips, or tongue bloody or black, tarry breathing problems changes in vision chest pain chills confusion constipation cough diarrhea dizziness or feeling faint or lightheaded fast or irregular heartbeat fever flushing joint pain low blood counts - this medicine may decrease the number of white blood cells, red blood cells and platelets. You may be at increased risk for infections and bleeding. muscle pain muscle weakness pain, tingling, numbness in the hands or feet persistent headache redness, blistering, peeling or loosening of the skin, including inside the mouth signs and symptoms of high blood sugar such as dizziness; dry mouth; dry skin; fruity breath; nausea; stomach pain; increased hunger or thirst; increased urination signs and symptoms of kidney injury like trouble passing urine or change in the amount of urine signs and symptoms of liver injury like dark urine, light-colored stools, loss of appetite, nausea, right upper belly pain, yellowing of the eyes or skin sweating swollen lymph nodes weight loss Side effects that usually do not require medical attention (report to your doctor or health care professional if they continue or are bothersome): decreased appetite hair loss tiredness This list may not describe all possible side effects. Call your doctor for medical advice about side effects. You may report side effects to FDA at 1-800-FDA-1088. Where should I keep my medication? This drug is given in a hospital or clinic and will not be stored at home. NOTE: This sheet is a summary. It may not cover all possible information. If you have questions about this medicine, talk to your doctor, pharmacist, or health care provider.  2022 Elsevier/Gold Standard (2021-05-19 00:00:00) Pemetrexed injection What is this medication? PEMETREXED (PEM e TREX ed) is a chemotherapy drug used to treat lung cancers like non-small cell lung cancer and mesothelioma. It  may also be used to treat other cancers. This medicine may be used for other purposes; ask your health care provider or pharmacist if you have questions. COMMON BRAND NAME(S): Alimta, PEMFEXY What should I tell my care team before I take this medication? They need to know if you have any of these conditions: infection (especially a virus infection such as chickenpox, cold sores, or herpes) kidney disease low blood counts, like low white cell, platelet, or red cell counts lung or breathing disease, like asthma radiation therapy an unusual or allergic reaction to pemetrexed, other medicines, foods, dyes, or preservative pregnant or trying to get pregnant breast-feeding How should I use this medication? This drug is given as an infusion into a vein. It is administered in a hospital or clinic by a specially trained health care professional. Talk to your pediatrician regarding the use of this medicine in children. Special care may be needed. Overdosage: If you think you have  taken too much of this medicine contact a poison control center or emergency room at once. NOTE: This medicine is only for you. Do not share this medicine with others. What if I miss a dose? It is important not to miss your dose. Call your doctor or health care professional if you are unable to keep an appointment. What may interact with this medication? This medicine may interact with the following medications: Ibuprofen This list may not describe all possible interactions. Give your health care provider a list of all the medicines, herbs, non-prescription drugs, or dietary supplements you use. Also tell them if you smoke, drink alcohol, or use illegal drugs. Some items may interact with your medicine. What should I watch for while using this medication? Visit your doctor for checks on your progress. This drug may make you feel generally unwell. This is not uncommon, as chemotherapy can affect healthy cells as well as cancer  cells. Report any side effects. Continue your course of treatment even though you feel ill unless your doctor tells you to stop. In some cases, you may be given additional medicines to help with side effects. Follow all directions for their use. Call your doctor or health care professional for advice if you get a fever, chills or sore throat, or other symptoms of a cold or flu. Do not treat yourself. This drug decreases your body's ability to fight infections. Try to avoid being around people who are sick. This medicine may increase your risk to bruise or bleed. Call your doctor or health care professional if you notice any unusual bleeding. Be careful brushing and flossing your teeth or using a toothpick because you may get an infection or bleed more easily. If you have any dental work done, tell your dentist you are receiving this medicine. Avoid taking products that contain aspirin, acetaminophen, ibuprofen, naproxen, or ketoprofen unless instructed by your doctor. These medicines may hide a fever. Call your doctor or health care professional if you get diarrhea or mouth sores. Do not treat yourself. To protect your kidneys, drink water or other fluids as directed while you are taking this medicine. Do not become pregnant while taking this medicine or for 6 months after stopping it. Women should inform their doctor if they wish to become pregnant or think they might be pregnant. Men should not father a child while taking this medicine and for 3 months after stopping it. This may interfere with the ability to father a child. You should talk to your doctor or health care professional if you are concerned about your fertility. There is a potential for serious side effects to an unborn child. Talk to your health care professional or pharmacist for more information. Do not breast-feed an infant while taking this medicine or for 1 week after stopping it. What side effects may I notice from receiving this  medication? Side effects that you should report to your doctor or health care professional as soon as possible: allergic reactions like skin rash, itching or hives, swelling of the face, lips, or tongue breathing problems redness, blistering, peeling or loosening of the skin, including inside the mouth signs and symptoms of bleeding such as bloody or black, tarry stools; red or dark-brown urine; spitting up blood or brown material that looks like coffee grounds; red spots on the skin; unusual bruising or bleeding from the eye, gums, or nose signs and symptoms of infection like fever or chills; cough; sore throat; pain or trouble passing urine signs and symptoms  of kidney injury like trouble passing urine or change in the amount of urine signs and symptoms of liver injury like dark yellow or brown urine; general ill feeling or flu-like symptoms; light-colored stools; loss of appetite; nausea; right upper belly pain; unusually weak or tired; yellowing of the eyes or skin Side effects that usually do not require medical attention (report to your doctor or health care professional if they continue or are bothersome): constipation mouth sores nausea, vomiting unusually weak or tired This list may not describe all possible side effects. Call your doctor for medical advice about side effects. You may report side effects to FDA at 1-800-FDA-1088. Where should I keep my medication? This drug is given in a hospital or clinic and will not be stored at home. NOTE: This sheet is a summary. It may not cover all possible information. If you have questions about this medicine, talk to your doctor, pharmacist, or health care provider.  2022 Elsevier/Gold Standard (2017-10-25 00:00:00)

## 2021-10-14 NOTE — Progress Notes (Signed)
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  She underwent operative fixation on 10/12/2020.  MRI of femur showed heterogeneously enhancing osseous lesion within the area which would be nonspecific versus office neoplasm or metastatic lesion.  CT chest abdomen and pelvis with contrast showed an enlarged pretracheal lymph node 2.2 x 1.5 cm and a right paratracheal lymph node measuring 2.7 x 1.3 cm.  Prevascular node measuring 0.3 x 0.9 cm.  5 x 4 mm right middle lobe nodule.  2.8 x 1.9 cm left adrenal lesion.    Reamings from the right femur showed metastatic poorly differentiated carcinoma.  Immunohistochemistry showed was positive for pancytokeratin, CK7 and patchy CK20 with patchy dim expression of TTF-1.  Cells negative for Melan-A, CDX2, PAX8, Napsin A, GATA3, p40, CD56, p16 and thyroglobulin.  Findings compatible with metastatic carcinoma but because of decalcification immunohistochemical staining is unreliable.  Patchy dim staining with TTF-1 suspicious for lung primary but not a definitive diagnosis.   Repeat supraclavicular lymph node biopsy showed  metastatic adenocarcinoma.  Tumor cells positive for CK7 with focal weak staining for TTF-1.  Suggestive of lung origin in the proper clinical context.  Cells were negative for GATA3 PAX8 CDX2 and CK20 and Napsin A.  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  She is currently on hydrocortisone twice daily.  Thyroid functions presently are normal    Interval history-patient reports feeling depressed at times but states that she has been able to come out of those situations.  She continues to work and also has a good social life.  She would like to hold off on starting any medications for depression just yet.  ECOG PS- 1 Pain scale- 0 Opioid associated constipation- no  Review of systems- Review of Systems  Constitutional:  Negative for chills, fever, malaise/fatigue and weight loss.  HENT:  Negative for congestion, ear discharge and nosebleeds.   Eyes:  Negative for blurred vision.  Respiratory:  Negative for cough, hemoptysis, sputum production, shortness of breath and wheezing.   Cardiovascular:  Negative for chest pain, palpitations, orthopnea and claudication.  Gastrointestinal:  Negative for abdominal pain, blood in stool, constipation, diarrhea, heartburn, melena, nausea and vomiting.  Genitourinary:  Negative for dysuria, flank pain, frequency, hematuria and urgency.  Musculoskeletal:  Negative for back pain, joint pain and myalgias.  Skin:  Negative for rash.  Neurological:  Negative for dizziness, tingling, focal weakness, seizures, weakness and headaches.  Endo/Heme/Allergies:  Does not bruise/bleed easily.  Psychiatric/Behavioral:  Negative for depression and suicidal ideas. The patient does not have insomnia.      Allergies  Allergen Reactions  Factive [Gemifloxacin] Rash     Past Medical History:  Diagnosis Date   Abnormal Pap smear  of cervix    Anxiety    Depression    Diverticulosis    Essential hypertension    Femur fracture, right (HCC)    GERD (gastroesophageal reflux disease)    Lung cancer (HCC)    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   Palpitations    Precordial chest pain    Wrist fracture 03/2021   left     Past Surgical History:  Procedure Laterality Date   BREAST BIOPSY Right 2017   benign   CERVICAL BIOPSY  W/ LOOP ELECTRODE EXCISION     COLONOSCOPY WITH PROPOFOL N/A 07/03/2015   Procedure: COLONOSCOPY WITH PROPOFOL;  Surgeon: Hulen Luster, MD;  Location: [facility] ENDOSCOPY;  Service: Gastroenterology;  Laterality: N/A;   ESOPHAGOGASTRODUODENOSCOPY (EGD) WITH PROPOFOL N/A 07/03/2015   Procedure: ESOPHAGOGASTRODUODENOSCOPY (EGD) WITH PROPOFOL;  Surgeon: Hulen Luster, MD;  Location: [facility] ENDOSCOPY;  Service: Gastroenterology;  Laterality: N/A;   INTRAMEDULLARY (IM) NAIL INTERTROCHANTERIC Right 09/15/2020   Procedure: INTRAMEDULLARY (IM) NAIL INTERTROCHANTRIC;  Surgeon: Corky Mull, MD;  Location: [facility] ORS;  Service: Orthopedics;  Laterality: Right;   IR IMAGING GUIDED PORT INSERTION  11/28/2020   LEFT HEART CATH AND CORONARY ANGIOGRAPHY N/A 03/28/2017   Procedure: Left Heart Cath and Coronary Angiography;  Surgeon: Lorretta Harp, MD;  Location: [place] CV LAB;  Service: Cardiovascular;  Laterality: N/A;   ORIF WRIST FRACTURE Left 03/31/2021   Procedure: OPEN REDUCTION INTERNAL FIXATION (ORIF) LEFT DISTAL RADIUS FRACTURE.;  Surgeon: Corky Mull, MD;  Location: [facility] ORS;  Service: Orthopedics;  Laterality: Left;    Social History   Socioeconomic History   Marital status: Married    Spouse name: Gene   Number of children: 2   Years of education: Not on file   Highest education level: Not on file  Occupational History   Not on file  Tobacco Use   Smoking status: Former    Packs/day: 1.00    Years: 30.00    Pack years: 30.00    Types: Cigarettes    Quit date: 09/17/2020    Years since  quitting: 1.0   Smokeless tobacco: Never  Vaping Use   Vaping Use: Never used  Substance and Sexual Activity   Alcohol use: Not Currently    Alcohol/week: 1.0 standard drink    Types: 1 Standard drinks or equivalent per week    Comment: beer occ.   Drug use: No   Sexual activity: Yes    Birth control/protection: Post-menopausal  Other Topics Concern   Not on file  Social History Narrative   Lives in [place] with her husband.  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   Social Determinants of Health   Financial Resource Strain: Not on file  Food Insecurity: Not on file  Transportation Needs: Not on file  Physical Activity: Not on file  Stress: Not on file  Social Connections: Not on file  Intimate Partner Violence: Not on file    Family History  Problem Relation Age of Onset   Diabetes Mother        alive @ 54   Goiter Mother    Heart disease Father        died of MI @ 28   Osteoporosis Maternal Grandmother    Colon cancer Maternal Grandfather    Breast cancer Neg Hx  Ovarian cancer Neg Hx      Current Outpatient Medications:    acetaminophen (TYLENOL) 325 MG tablet, Take 1-2 tablets (325-650 mg total) by mouth every 6 (six) hours as needed for mild pain (pain score 1-3 or temp > 100.5)., Disp: , Rfl:    ALPRAZolam (XANAX) 0.5 MG tablet, TAKE 1 TABLET BY MOUTH DAILY AS NEEDED, Disp: 30 tablet, Rfl: 0   famotidine (PEPCID) 20 MG tablet, Take 20 mg by mouth in the morning., Disp: , Rfl:    folic acid (FOLVITE) 1 MG tablet, Take 1 tablet (1 mg total) by mouth daily., Disp: 90 tablet, Rfl: 1   hydrocortisone (CORTEF) 10 MG tablet, TAKE 1 TABLET BY MOUTH AT BEDTIME, Disp: 30 tablet, Rfl: 1   hydrocortisone (CORTEF) 20 MG tablet, TAKE 1 TABLET BY MOUTH EVERY MORNING, Disp: 30 tablet, Rfl: 1   lidocaine-prilocaine (EMLA) cream, Apply to affected area once (Patient taking differently: Apply 1 application topically as needed (prior  to port being accessed).), Disp: 30 g, Rfl: 3   metoprolol tartrate (LOPRESSOR) 25 MG tablet, Take 25 mg by mouth in the morning., Disp: , Rfl:    ondansetron (ZOFRAN) 4 MG tablet, Take 1 tablet (4 mg total) by mouth every 8 (eight) hours as needed for nausea or vomiting., Disp: 30 tablet, Rfl: 1   oxyCODONE (OXY IR/ROXICODONE) 5 MG immediate release tablet, TAKE 1 TO 2 TABLETS BY MOUTH EVERY 4 HOURS AS NEEDED FOR MODERATE PAIN, Disp: 60 tablet, Rfl: 0   Pembrolizumab (KEYTRUDA IV), Inject into the vein. Every 3 weeks, Disp: , Rfl:    PEMEtrexed Disodium (ALIMTA IV), Inject into the vein. Every 3 weeks, Disp: , Rfl:    polyethylene glycol (MIRALAX) 17 g packet, Take 17 g by mouth daily as needed for moderate constipation., Disp: 30 each, Rfl: 0   prochlorperazine (COMPAZINE) 10 MG tablet, Take 1 tablet (10 mg total) by mouth every 6 (six) hours as needed (Nausea or vomiting)., Disp: 30 tablet, Rfl: 1 No current facility-administered medications for this visit.  Facility-Administered Medications Ordered in Other Visits:    sodium chloride flush (NS) 0.9 % injection 10 mL, 10 mL, Intravenous, PRN, Sindy Guadeloupe, MD, 10 mL at 03/09/21 0906   Zoledronic Acid (ZOMETA) IVPB 4 mg, 4 mg, Intravenous, Q28 days, Sindy Guadeloupe, MD, Stopped at 11/21/20 1100  Physical exam:  Vitals:   10/14/21 0910  BP: (!) 136/92  Pulse: 81  Resp: 16  Temp: (!) 97.2 F (36.2 C)  TempSrc: Tympanic  SpO2: 99%  Weight: 225 lb 6.4 oz (102.2 kg)  Height: 5' 6"  (1.676 m)   Physical Exam Cardiovascular:     Rate and Rhythm: Normal rate and regular rhythm.     Heart sounds: Normal heart sounds.  Pulmonary:     Effort: Pulmonary effort is normal.     Breath sounds: Normal breath sounds.  Abdominal:     General: Bowel sounds are normal.     Palpations: Abdomen is soft.  Skin:    General: Skin is warm and dry.  Neurological:     Mental Status: She is alert and oriented to person, place, and time.     CMP Latest  Ref Rng & Units 09/22/2021  Glucose 70 - 99 mg/dL 114(H)  BUN 6 - 20 mg/dL 17  Creatinine 0.44 - 1.00 mg/dL 0.68  Sodium 135 - 145 mmol/L 135  Potassium 3.5 - 5.1 mmol/L 3.7  Chloride 98 - 111 mmol/L 106  CO2 22 -  32 mmol/L 24  Calcium 8.9 - 10.3 mg/dL 8.3(L)  Total Protein 6.5 - 8.1 g/dL 7.3  Total Bilirubin 0.3 - 1.2 mg/dL 0.6  Alkaline Phos 38 - 126 U/L 76  AST 15 - 41 U/L 20  ALT 0 - 44 U/L 22   CBC Latest Ref Rng & Units 09/22/2021  WBC 4.0 - 10.5 K/uL 7.7  Hemoglobin 12.0 - 15.0 g/dL 14.0  Hematocrit 36.0 - 46.0 % 42.1  Platelets 150 - 400 K/uL 267      Assessment and plan- Patient is a 56 y.o. 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  She is here for on treatment assessment prior to cycle 12 of maintenance Alimta and Keytruda  Counts okay to proceed with cycle 12 of maintenance Alimta and Keytruda today.  I will see her back in 3 weeks for cycle 13.  Plan to repeat scans after 13 cycles.  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  She does not have any evidence of hypothyroidism which we will continue to monitor.    Neoplasm related pain: She is on as needed oxycodone but has not been requiring to use much of it.  Patient will receive B12 injection with her next cycle.  She continues to remain on oral folic acid.   Visit Diagnosis 1. Encounter for antineoplastic chemotherapy   2. Encounter for antineoplastic immunotherapy   3. Primary malignant neoplasm of right lung metastatic to other site ([place])   4. Autoimmune hypophysitis ([place])      Dr. Randa Evens, MD, MPH [facility] at [facility] [phone] 10/14/2021 8:45 AM

## 2021-10-15 ENCOUNTER — Other Ambulatory Visit: Payer: Self-pay | Admitting: Oncology

## 2021-10-19 ENCOUNTER — Telehealth: Payer: Self-pay | Admitting: *Deleted

## 2021-10-19 ENCOUNTER — Other Ambulatory Visit: Payer: Self-pay | Admitting: Oncology

## 2021-10-19 NOTE — Telephone Encounter (Signed)
Patient called complaining of her mouth and tongue being very dry. She feels dehydrated and states she has not really been able to eat much. She is also complaining of nasal congestion. Please advise

## 2021-10-19 NOTE — Telephone Encounter (Signed)
Does she wish to come for IVF and assessment at [facility]?

## 2021-10-19 NOTE — Telephone Encounter (Signed)
Pt contacted. She reports that she is drinking plenty of water and is using Biotene and lozenges for the dry mouth. Pt also reports that she is losing taste. She stated that she took a home covid test that was negative. She has also purchased OTC Mucinex for the congestion. She was advised to obtain a covid pcr test. She will call us back after she obtains her test results and will schedule appointment if needed. Pt verbalized understanding.

## 2021-10-19 NOTE — Telephone Encounter (Signed)
Please schedule [facility] appointment

## 2021-10-20 NOTE — Telephone Encounter (Signed)
I called her cell phone and left message that checking on her about how she is feeling, did she get covid PCR test and got results. Left my direct number to call

## 2021-10-29 ENCOUNTER — Other Ambulatory Visit: Payer: Self-pay | Admitting: Nurse Practitioner

## 2021-10-29 DIAGNOSIS — C3491 Malignant neoplasm of unspecified part of right bronchus or lung: Secondary | ICD-10-CM

## 2021-11-04 ENCOUNTER — Telehealth: Payer: Self-pay | Admitting: Licensed Clinical Social Worker

## 2021-11-04 ENCOUNTER — Inpatient Hospital Stay (HOSPITAL_BASED_OUTPATIENT_CLINIC_OR_DEPARTMENT_OTHER): Payer: 59 | Admitting: Oncology

## 2021-11-04 ENCOUNTER — Encounter: Payer: Self-pay | Admitting: Oncology

## 2021-11-04 ENCOUNTER — Other Ambulatory Visit: Payer: Self-pay

## 2021-11-04 ENCOUNTER — Inpatient Hospital Stay: Payer: 59

## 2021-11-04 VITALS — BP 125/91 | HR 71 | Temp 96.5°F | Resp 16 | Ht 66.0 in | Wt 223.0 lb

## 2021-11-04 DIAGNOSIS — C3491 Malignant neoplasm of unspecified part of right bronchus or lung: Secondary | ICD-10-CM

## 2021-11-04 DIAGNOSIS — C7951 Secondary malignant neoplasm of bone: Secondary | ICD-10-CM | POA: Diagnosis not present

## 2021-11-04 DIAGNOSIS — I10 Essential (primary) hypertension: Secondary | ICD-10-CM | POA: Diagnosis not present

## 2021-11-04 DIAGNOSIS — G893 Neoplasm related pain (acute) (chronic): Secondary | ICD-10-CM | POA: Diagnosis not present

## 2021-11-04 DIAGNOSIS — Z5111 Encounter for antineoplastic chemotherapy: Secondary | ICD-10-CM | POA: Diagnosis not present

## 2021-11-04 DIAGNOSIS — Z5112 Encounter for antineoplastic immunotherapy: Secondary | ICD-10-CM

## 2021-11-04 DIAGNOSIS — R69 Illness, unspecified: Secondary | ICD-10-CM | POA: Diagnosis not present

## 2021-11-04 DIAGNOSIS — K219 Gastro-esophageal reflux disease without esophagitis: Secondary | ICD-10-CM | POA: Diagnosis not present

## 2021-11-04 DIAGNOSIS — D8989 Other specified disorders involving the immune mechanism, not elsewhere classified: Secondary | ICD-10-CM

## 2021-11-04 DIAGNOSIS — E2740 Unspecified adrenocortical insufficiency: Secondary | ICD-10-CM | POA: Diagnosis not present

## 2021-11-04 DIAGNOSIS — E236 Other disorders of pituitary gland: Secondary | ICD-10-CM | POA: Diagnosis not present

## 2021-11-04 LAB — CBC WITH DIFFERENTIAL/PLATELET
Abs Immature Granulocytes: 0.05 10*3/uL (ref 0.00–0.07)
Basophils Absolute: 0.1 10*3/uL (ref 0.0–0.1)
Basophils Relative: 1 %
Eosinophils Absolute: 0.2 10*3/uL (ref 0.0–0.5)
Eosinophils Relative: 2 %
HCT: 41.3 % (ref 36.0–46.0)
Hemoglobin: 13.4 g/dL (ref 12.0–15.0)
Immature Granulocytes: 1 %
Lymphocytes Relative: 27 %
Lymphs Abs: 2 10*3/uL (ref 0.7–4.0)
MCH: 29.3 pg (ref 26.0–34.0)
MCHC: 32.4 g/dL (ref 30.0–36.0)
MCV: 90.4 fL (ref 80.0–100.0)
Monocytes Absolute: 0.8 10*3/uL (ref 0.1–1.0)
Monocytes Relative: 11 %
Neutro Abs: 4.2 10*3/uL (ref 1.7–7.7)
Neutrophils Relative %: 58 %
Platelets: 330 10*3/uL (ref 150–400)
RBC: 4.57 MIL/uL (ref 3.87–5.11)
RDW: 14.6 % (ref 11.5–15.5)
WBC: 7.3 10*3/uL (ref 4.0–10.5)
nRBC: 0 % (ref 0.0–0.2)

## 2021-11-04 LAB — COMPREHENSIVE METABOLIC PANEL
ALT: 26 U/L (ref 0–44)
AST: 20 U/L (ref 15–41)
Albumin: 3.6 g/dL (ref 3.5–5.0)
Alkaline Phosphatase: 68 U/L (ref 38–126)
Anion gap: 6 (ref 5–15)
BUN: 16 mg/dL (ref 6–20)
CO2: 25 mmol/L (ref 22–32)
Calcium: 8.7 mg/dL — ABNORMAL LOW (ref 8.9–10.3)
Chloride: 106 mmol/L (ref 98–111)
Creatinine, Ser: 0.74 mg/dL (ref 0.44–1.00)
GFR, Estimated: >60 mL/min (ref >60)
Glucose, Bld: 123 mg/dL — ABNORMAL HIGH (ref 70–99)
Potassium: 3.7 mmol/L (ref 3.5–5.1)
Sodium: 137 mmol/L (ref 135–145)
Total Bilirubin: 0.7 mg/dL (ref 0.3–1.2)
Total Protein: 7 g/dL (ref 6.5–8.1)

## 2021-11-04 LAB — TSH: TSH: 2.675 u[IU]/mL (ref 0.350–4.500)

## 2021-11-04 LAB — T4, FREE: Free T4: 0.97 ng/dL (ref 0.61–1.12)

## 2021-11-04 MED ORDER — SODIUM CHLORIDE 0.9 % IV SOLN
Freq: Once | INTRAVENOUS | Status: AC
  Filled 2021-11-04: qty 250

## 2021-11-04 MED ORDER — HEPARIN SOD (PORK) LOCK FLUSH 100 UNIT/ML IV SOLN
500.0000 [IU] | Freq: Once | INTRAVENOUS | Status: AC | PRN
  Administered 2021-11-04: 500 [IU]
  Filled 2021-11-04: qty 5

## 2021-11-04 MED ORDER — FOLIC ACID 1 MG PO TABS: 1.0000 mg | ORAL_TABLET | Freq: Every day | ORAL | 1 refills | Status: DC

## 2021-11-04 MED ORDER — ALPRAZOLAM 0.5 MG PO TABS: 0.5000 mg | ORAL_TABLET | Freq: Every day | ORAL | 0 refills | Status: DC | PRN

## 2021-11-04 MED ORDER — HEPARIN SOD (PORK) LOCK FLUSH 100 UNIT/ML IV SOLN
INTRAVENOUS | Status: AC
  Filled 2021-11-04: qty 5

## 2021-11-04 MED ORDER — SODIUM CHLORIDE 0.9 % IV SOLN
500.0000 mg/m2 | Freq: Once | INTRAVENOUS | Status: AC
  Administered 2021-11-04: 1000 mg via INTRAVENOUS
  Filled 2021-11-04: qty 40

## 2021-11-04 MED ORDER — ONDANSETRON HCL 4 MG/2ML IJ SOLN
4.0000 mg | Freq: Once | INTRAMUSCULAR | Status: AC
  Administered 2021-11-04: 4 mg via INTRAVENOUS
  Filled 2021-11-04: qty 2

## 2021-11-04 MED ORDER — SODIUM CHLORIDE 0.9 % IV SOLN
200.0000 mg | Freq: Once | INTRAVENOUS | Status: AC
  Administered 2021-11-04: 200 mg via INTRAVENOUS
  Filled 2021-11-04: qty 200

## 2021-11-04 MED ORDER — CYANOCOBALAMIN 1000 MCG/ML IJ SOLN
1000.0000 ug | INTRAMUSCULAR | Status: DC
  Administered 2021-11-04: 1000 ug via INTRAMUSCULAR
  Filled 2021-11-04: qty 1

## 2021-11-04 NOTE — Progress Notes (Signed)
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  She underwent operative fixation on 10/12/2020.  MRI of femur showed heterogeneously enhancing osseous lesion within the area which would be nonspecific versus office neoplasm or metastatic lesion.  CT chest abdomen and pelvis with contrast showed an enlarged pretracheal lymph node 2.2 x 1.5 cm and a right paratracheal lymph node measuring 2.7 x 1.3 cm.  Prevascular node measuring 0.3 x 0.9 cm.  5 x 4 mm right middle lobe nodule.  2.8 x 1.9 cm left adrenal lesion.    Reamings from the right femur showed metastatic poorly differentiated carcinoma.  Immunohistochemistry showed was positive for pancytokeratin, CK7 and patchy CK20 with patchy dim expression of TTF-1.  Cells negative for Melan-A, CDX2, PAX8, Napsin A, GATA3, p40, CD56, p16 and thyroglobulin.  Findings compatible with metastatic carcinoma but because of decalcification immunohistochemical staining is unreliable.  Patchy dim staining with TTF-1 suspicious for lung primary but not a definitive diagnosis.   Repeat supraclavicular lymph node biopsy showed  metastatic adenocarcinoma.  Tumor cells positive for CK7 with focal weak staining for TTF-1.  Suggestive of lung origin in the proper clinical context.  Cells were negative for GATA3 PAX8 CDX2 and CK20 and Napsin A.  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  She is currently on hydrocortisone twice daily.  Thyroid functions presently are normal    Interval history-patient is doing well and denies any specific complaints at this time.  Anxiety is well controlled with Xanax  ECOG PS- 0 Pain scale- 0   Review of systems- Review of Systems  Constitutional:  Positive for malaise/fatigue. Negative for chills, fever and weight loss.  HENT:  Negative for congestion, ear discharge and nosebleeds.   Eyes:  Negative for blurred vision.  Respiratory:  Negative for cough, hemoptysis, sputum production, shortness of breath and wheezing.   Cardiovascular:  Negative for chest pain, palpitations, orthopnea and claudication.  Gastrointestinal:  Negative for abdominal pain, blood in stool, constipation, diarrhea, heartburn, melena, nausea and vomiting.  Genitourinary:  Negative for dysuria, flank pain, frequency, hematuria and urgency.  Musculoskeletal:  Negative for back pain, joint pain and myalgias.  Skin:  Negative for rash.  Neurological:  Negative for dizziness, tingling, focal weakness, seizures, weakness and headaches.  Endo/Heme/Allergies:  Does not bruise/bleed easily.  Psychiatric/Behavioral:  Negative for depression and suicidal ideas. The patient does not have insomnia.      Allergies  Allergen Reactions   Factive [Gemifloxacin] Rash     Past Medical History:  Diagnosis Date   Abnormal Pap smear of cervix    Anxiety    Depression  Diverticulosis    Essential hypertension    Femur fracture, right (HCC)    GERD (gastroesophageal reflux disease)     Lung cancer (HCC)    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   Palpitations    Precordial chest pain    Wrist fracture 03/2021   left     Past Surgical History:  Procedure Laterality Date   BREAST BIOPSY Right 2017   benign   CERVICAL BIOPSY  W/ LOOP ELECTRODE EXCISION     COLONOSCOPY WITH PROPOFOL N/A 07/03/2015   Procedure: COLONOSCOPY WITH PROPOFOL;  Surgeon: Hulen Luster, MD;  Location: [facility] ENDOSCOPY;  Service: Gastroenterology;  Laterality: N/A;   ESOPHAGOGASTRODUODENOSCOPY (EGD) WITH PROPOFOL N/A 07/03/2015   Procedure: ESOPHAGOGASTRODUODENOSCOPY (EGD) WITH PROPOFOL;  Surgeon: Hulen Luster, MD;  Location: [facility] ENDOSCOPY;  Service: Gastroenterology;  Laterality: N/A;   INTRAMEDULLARY (IM) NAIL INTERTROCHANTERIC Right 09/15/2020   Procedure: INTRAMEDULLARY (IM) NAIL INTERTROCHANTRIC;  Surgeon: Corky Mull, MD;  Location: [facility] ORS;  Service: Orthopedics;  Laterality: Right;   IR IMAGING GUIDED PORT INSERTION  11/28/2020   LEFT HEART CATH AND CORONARY ANGIOGRAPHY N/A 03/28/2017   Procedure: Left Heart Cath and Coronary Angiography;  Surgeon: Lorretta Harp, MD;  Location: [place] CV LAB;  Service: Cardiovascular;  Laterality: N/A;   ORIF WRIST FRACTURE Left 03/31/2021   Procedure: OPEN REDUCTION INTERNAL FIXATION (ORIF) LEFT DISTAL RADIUS FRACTURE.;  Surgeon: Corky Mull, MD;  Location: [facility] ORS;  Service: Orthopedics;  Laterality: Left;    Social History   Socioeconomic History   Marital status: Married    Spouse name: Gene   Number of children: 2   Years of education: Not on file   Highest education level: Not on file  Occupational History   Not on file  Tobacco Use   Smoking status: Former    Packs/day: 1.00    Years: 30.00    Pack years: 30.00    Types: Cigarettes    Quit date: 09/17/2020    Years since quitting: 1.1   Smokeless tobacco: Never  Vaping Use   Vaping Use: Never used  Substance and Sexual Activity   Alcohol use: Not Currently    Alcohol/week: 1.0  standard drink    Types: 1 Standard drinks or equivalent per week    Comment: beer occ.   Drug use: No   Sexual activity: Yes    Birth control/protection: Post-menopausal  Other Topics Concern   Not on file  Social History Narrative   Lives in [place] with her husband.  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   Social Determinants of Health   Financial Resource Strain: Not on file  Food Insecurity: Not on file  Transportation Needs: Not on file  Physical Activity: Not on file  Stress: Not on file  Social Connections: Not on file  Intimate Partner Violence: Not on file    Family History  Problem Relation Age of Onset   Diabetes Mother        alive @ 53   Goiter Mother    Heart disease Father        died of MI @ 75   Osteoporosis Maternal Grandmother    Colon cancer Maternal Grandfather    Breast cancer Neg Hx    Ovarian cancer Neg Hx      Current Outpatient Medications:    acetaminophen (TYLENOL) 325 MG tablet, Take 1-2 tablets (325-650 mg total) by mouth every 6 (  six) hours as needed for mild pain (pain score 1-3 or temp > 100.5)., Disp: , Rfl:    famotidine (PEPCID) 20 MG tablet, Take 20 mg by mouth in the morning., Disp: , Rfl:    hydrocortisone (CORTEF) 10 MG tablet, TAKE 1 TABLET BY MOUTH NIGHTLY, Disp: 30 tablet, Rfl: 1   hydrocortisone (CORTEF) 20 MG tablet, TAKE 1 TABLET BY MOUTH EVERY MORNING, Disp: 30 tablet, Rfl: 1   lidocaine-prilocaine (EMLA) cream, Apply to affected area once (Patient taking differently: Apply 1 application topically as needed (prior to port being accessed).), Disp: 30 g, Rfl: 3   metoprolol tartrate (LOPRESSOR) 25 MG tablet, Take 25 mg by mouth in the morning., Disp: , Rfl:    ondansetron (ZOFRAN) 4 MG tablet, Take 1 tablet (4 mg total) by mouth every 8 (eight) hours as needed for nausea or vomiting., Disp: 30 tablet, Rfl: 1   oxyCODONE (OXY IR/ROXICODONE) 5 MG immediate release tablet, TAKE 1 TO  2 TABLETS BY MOUTH EVERY 4 HOURS AS NEEDED FOR MODERATE PAIN, Disp: 60 tablet, Rfl: 0   Pembrolizumab (KEYTRUDA IV), Inject into the vein. Every 3 weeks, Disp: , Rfl:    PEMEtrexed Disodium (ALIMTA IV), Inject into the vein. Every 3 weeks, Disp: , Rfl:    polyethylene glycol (MIRALAX) 17 g packet, Take 17 g by mouth daily as needed for moderate constipation., Disp: 30 each, Rfl: 0   prochlorperazine (COMPAZINE) 10 MG tablet, Take 1 tablet (10 mg total) by mouth every 6 (six) hours as needed (Nausea or vomiting)., Disp: 30 tablet, Rfl: 1   ALPRAZolam (XANAX) 0.5 MG tablet, Take 1 tablet (0.5 mg total) by mouth daily as needed., Disp: 30 tablet, Rfl: 0   folic acid (FOLVITE) 1 MG tablet, Take 1 tablet (1 mg total) by mouth daily., Disp: 90 tablet, Rfl: 1 No current facility-administered medications for this visit.  Facility-Administered Medications Ordered in Other Visits:    cyanocobalamin ((VITAMIN B-12)) injection 1,000 mcg, 1,000 mcg, Intramuscular, Q28 days, Sindy Guadeloupe, MD, 1,000 mcg at 11/04/21 1032   heparin lock flush 100 UNIT/ML injection, , , ,    sodium chloride flush (NS) 0.9 % injection 10 mL, 10 mL, Intravenous, PRN, Sindy Guadeloupe, MD, 10 mL at 03/09/21 0906   Zoledronic Acid (ZOMETA) IVPB 4 mg, 4 mg, Intravenous, Q28 days, Sindy Guadeloupe, MD, Stopped at 11/21/20 1100  Physical exam:  Vitals:   11/04/21 0827  BP: (!) 125/91  Pulse: 71  Resp: 16  Temp: (!) 96.5 F (35.8 C)  TempSrc: Tympanic  SpO2: 99%  Weight: 223 lb (101.2 kg)  Height: 5' 6"  (1.676 m)   Physical Exam Constitutional:      General: She is not in acute distress. Cardiovascular:     Rate and Rhythm: Normal rate and regular rhythm.     Heart sounds: Normal heart sounds.  Pulmonary:     Effort: Pulmonary effort is normal.     Breath sounds: Normal breath sounds.  Skin:    General: Skin is warm and dry.  Neurological:     Mental Status: She is alert and oriented to person, place, and time.      CMP Latest Ref Rng & Units 11/04/2021  Glucose 70 - 99 mg/dL 123(H)  BUN 6 - 20 mg/dL 16  Creatinine 0.44 - 1.00 mg/dL 0.74  Sodium 135 - 145 mmol/L 137  Potassium 3.5 - 5.1 mmol/L 3.7  Chloride 98 - 111 mmol/L 106  CO2 22 - 32  mmol/L 25  Calcium 8.9 - 10.3 mg/dL 8.7(L)  Total Protein 6.5 - 8.1 g/dL 7.0  Total Bilirubin 0.3 - 1.2 mg/dL 0.7  Alkaline Phos 38 - 126 U/L 68  AST 15 - 41 U/L 20  ALT 0 - 44 U/L 26   CBC Latest Ref Rng & Units 11/04/2021  WBC 4.0 - 10.5 K/uL 7.3  Hemoglobin 12.0 - 15.0 g/dL 13.4  Hematocrit 36.0 - 46.0 % 41.3  Platelets 150 - 400 K/uL 330      Assessment and plan- Patient is a 56 y.o. 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  She is here for on treatment assessment prior to cycle 13 of maintenance Alimta and Keytruda.    Counts are otherwise okay to proceed with cycle 13 of Alimta and Keytruda today.  I will see her back in 4 weeks for cycle 14.  I will get a CT chest abdomen and pelvis with contrast prior.  Neoplasm related pain: Continue as needed oxycodone  Anxiety continue Xanax as needed   Autoimmune hypophysitis: On maintenance hydrocortisone  Bone metastases: Zometa at next visit   Visit Diagnosis 1. Encounter for antineoplastic chemotherapy   2. Primary malignant neoplasm of right lung metastatic to other site ([place])   3. Encounter for antineoplastic immunotherapy      Dr. Randa Evens, MD, MPH [facility] at [facility] [phone] 11/04/2021 12:29 PM

## 2021-11-04 NOTE — Telephone Encounter (Signed)
Called pt to discuss possibility of genetics visit, patient was interested. I will meet with patient on 3/2 to discuss family history and testing.

## 2021-11-04 NOTE — Patient Instructions (Signed)
[facility] CANCER CTR AT [place]  Discharge Instructions: Thank you for choosing [place] to provide your oncology and hematology care.  If you have a lab appointment with the [facility], please go directly to the [facility] and check in at the registration area.  Wear comfortable clothing and clothing appropriate for easy access to any Portacath or PICC line.   We strive to give you quality time with your provider. You may need to reschedule your appointment if you arrive late (15 or more minutes).  Arriving late affects you and other patients whose appointments are after yours.  Also, if you miss three or more appointments without notifying the office, you may be dismissed from the clinic at the providers discretion.      For prescription refill requests, have your pharmacy contact our office and allow 72 hours for refills to be completed.    Today you received the following chemotherapy and/or immunotherapy agents Keytruda and alimta      To help prevent nausea and vomiting after your treatment, we encourage you to take your nausea medication as directed.  BELOW ARE SYMPTOMS THAT SHOULD BE REPORTED IMMEDIATELY: *FEVER GREATER THAN 100.4 F (38 C) OR HIGHER *CHILLS OR SWEATING *NAUSEA AND VOMITING THAT IS NOT CONTROLLED WITH YOUR NAUSEA MEDICATION *UNUSUAL SHORTNESS OF BREATH *UNUSUAL BRUISING OR BLEEDING *URINARY PROBLEMS (pain or burning when urinating, or frequent urination) *BOWEL PROBLEMS (unusual diarrhea, constipation, pain near the anus) TENDERNESS IN MOUTH AND THROAT WITH OR WITHOUT PRESENCE OF ULCERS (sore throat, sores in mouth, or a toothache) UNUSUAL RASH, SWELLING OR PAIN  UNUSUAL VAGINAL DISCHARGE OR ITCHING   Items with * indicate a potential emergency and should be followed up as soon as possible or go to the Emergency Department if any problems should occur.  Please show the CHEMOTHERAPY ALERT CARD or IMMUNOTHERAPY ALERT CARD at  check-in to the Emergency Department and triage nurse.  Should you have questions after your visit or need to cancel or reschedule your appointment, please contact [facility] CANCER [place] AT [place]  [phone] and follow the prompts.  Office hours are 8:00 a.m. to 4:30 p.m. Monday - Friday. Please note that voicemails left after 4:00 p.m. may not be returned until the following business day.  We are closed weekends and major holidays. You have access to a nurse at all times for urgent questions. Please call the main number to the clinic [phone] and follow the prompts.  For any non-urgent questions, you may also contact your provider using MyChart. We now offer e-Visits for anyone 16 and older to request care online for non-urgent symptoms. For details visit [email or web address].   Also download the MyChart app! Go to the app store, search "MyChart", open the app, select [place], and log in with your MyChart username and password.  Due to Covid, a mask is required upon entering the hospital/clinic. If you do not have a mask, one will be given to you upon arrival. For doctor visits, patients may have 1 support person aged 35 or older with them. For treatment visits, patients cannot have anyone with them due to current Covid guidelines and our immunocompromised population.

## 2021-11-11 ENCOUNTER — Encounter
Admission: RE | Admit: 2021-11-11 | Discharge: 2021-11-11 | Disposition: A | Discharge status: Home / Self Care | Payer: 59 | Source: Ambulatory Visit | Attending: Oncology | Admitting: Oncology

## 2021-11-11 ENCOUNTER — Ambulatory Visit
Admission: RE | Admit: 2021-11-11 | Discharge: 2021-11-11 | Disposition: A | Discharge status: Home / Self Care | Payer: 59 | Source: Ambulatory Visit | Attending: Oncology | Admitting: Oncology

## 2021-11-11 DIAGNOSIS — K5730 Diverticulosis of large intestine without perforation or abscess without bleeding: Secondary | ICD-10-CM | POA: Diagnosis not present

## 2021-11-11 DIAGNOSIS — K7689 Other specified diseases of liver: Secondary | ICD-10-CM | POA: Diagnosis not present

## 2021-11-11 DIAGNOSIS — C3490 Malignant neoplasm of unspecified part of unspecified bronchus or lung: Secondary | ICD-10-CM | POA: Diagnosis not present

## 2021-11-11 DIAGNOSIS — Z5111 Encounter for antineoplastic chemotherapy: Secondary | ICD-10-CM

## 2021-11-11 DIAGNOSIS — M4316 Spondylolisthesis, lumbar region: Secondary | ICD-10-CM | POA: Diagnosis not present

## 2021-11-11 DIAGNOSIS — J984 Other disorders of lung: Secondary | ICD-10-CM | POA: Diagnosis not present

## 2021-11-11 DIAGNOSIS — C7951 Secondary malignant neoplasm of bone: Secondary | ICD-10-CM | POA: Diagnosis not present

## 2021-11-11 MED ORDER — IOHEXOL 300 MG/ML  SOLN
100.0000 mL | Freq: Once | INTRAMUSCULAR | Status: AC | PRN
  Administered 2021-11-11: 100 mL via INTRAVENOUS

## 2021-11-11 MED ORDER — TECHNETIUM TC 99M MEDRONATE IV KIT
20.0000 | PACK | Freq: Once | INTRAVENOUS | Status: AC | PRN
  Administered 2021-11-11: 21.86 via INTRAVENOUS

## 2021-11-12 ENCOUNTER — Inpatient Hospital Stay: Payer: 59

## 2021-11-12 ENCOUNTER — Telehealth: Payer: Self-pay | Admitting: *Deleted

## 2021-11-12 ENCOUNTER — Inpatient Hospital Stay: Payer: 59 | Admitting: Licensed Clinical Social Worker

## 2021-11-12 NOTE — Telephone Encounter (Signed)
Patient called to cancel today's appointments. ?

## 2021-11-25 ENCOUNTER — Other Ambulatory Visit: Payer: Self-pay | Admitting: Oncology

## 2021-11-25 ENCOUNTER — Encounter: Payer: Self-pay | Admitting: Oncology

## 2021-12-02 ENCOUNTER — Encounter: Payer: Self-pay | Admitting: Oncology

## 2021-12-02 ENCOUNTER — Other Ambulatory Visit: Payer: Self-pay

## 2021-12-02 ENCOUNTER — Inpatient Hospital Stay: Payer: 59 | Admitting: Oncology

## 2021-12-02 ENCOUNTER — Inpatient Hospital Stay: Payer: 59 | Attending: Oncology

## 2021-12-02 ENCOUNTER — Inpatient Hospital Stay: Payer: 59

## 2021-12-02 VITALS — BP 123/95 | HR 81 | Temp 98.4°F | Resp 16 | Ht 66.0 in | Wt 229.0 lb

## 2021-12-02 DIAGNOSIS — C7951 Secondary malignant neoplasm of bone: Secondary | ICD-10-CM | POA: Insufficient documentation

## 2021-12-02 DIAGNOSIS — Z5112 Encounter for antineoplastic immunotherapy: Secondary | ICD-10-CM | POA: Diagnosis not present

## 2021-12-02 DIAGNOSIS — C7972 Secondary malignant neoplasm of left adrenal gland: Secondary | ICD-10-CM | POA: Diagnosis not present

## 2021-12-02 DIAGNOSIS — Z87891 Personal history of nicotine dependence: Secondary | ICD-10-CM | POA: Insufficient documentation

## 2021-12-02 DIAGNOSIS — Z5111 Encounter for antineoplastic chemotherapy: Secondary | ICD-10-CM | POA: Diagnosis not present

## 2021-12-02 DIAGNOSIS — C342 Malignant neoplasm of middle lobe, bronchus or lung: Secondary | ICD-10-CM | POA: Diagnosis not present

## 2021-12-02 DIAGNOSIS — C3491 Malignant neoplasm of unspecified part of right bronchus or lung: Secondary | ICD-10-CM

## 2021-12-02 DIAGNOSIS — Z79899 Other long term (current) drug therapy: Secondary | ICD-10-CM | POA: Diagnosis not present

## 2021-12-02 DIAGNOSIS — C778 Secondary and unspecified malignant neoplasm of lymph nodes of multiple regions: Secondary | ICD-10-CM | POA: Insufficient documentation

## 2021-12-02 LAB — COMPREHENSIVE METABOLIC PANEL
ALT: 26 U/L (ref 0–44)
AST: 25 U/L (ref 15–41)
Albumin: 3.6 g/dL (ref 3.5–5.0)
Alkaline Phosphatase: 80 U/L (ref 38–126)
Anion gap: 8 (ref 5–15)
BUN: 15 mg/dL (ref 6–20)
CO2: 23 mmol/L (ref 22–32)
Calcium: 8.5 mg/dL — ABNORMAL LOW (ref 8.9–10.3)
Chloride: 104 mmol/L (ref 98–111)
Creatinine, Ser: 0.73 mg/dL (ref 0.44–1.00)
GFR, Estimated: >60 mL/min (ref >60)
Glucose, Bld: 142 mg/dL — ABNORMAL HIGH (ref 70–99)
Potassium: 3.8 mmol/L (ref 3.5–5.1)
Sodium: 135 mmol/L (ref 135–145)
Total Bilirubin: 0.6 mg/dL (ref 0.3–1.2)
Total Protein: 7.2 g/dL (ref 6.5–8.1)

## 2021-12-02 LAB — CBC WITH DIFFERENTIAL/PLATELET
Abs Immature Granulocytes: 0.05 10*3/uL (ref 0.00–0.07)
Basophils Absolute: 0.1 10*3/uL (ref 0.0–0.1)
Basophils Relative: 1 %
Eosinophils Absolute: 0.2 10*3/uL (ref 0.0–0.5)
Eosinophils Relative: 2 %
HCT: 40.9 % (ref 36.0–46.0)
Hemoglobin: 13.6 g/dL (ref 12.0–15.0)
Immature Granulocytes: 1 %
Lymphocytes Relative: 17 %
Lymphs Abs: 1.7 10*3/uL (ref 0.7–4.0)
MCH: 29.8 pg (ref 26.0–34.0)
MCHC: 33.3 g/dL (ref 30.0–36.0)
MCV: 89.7 fL (ref 80.0–100.0)
Monocytes Absolute: 0.7 10*3/uL (ref 0.1–1.0)
Monocytes Relative: 7 %
Neutro Abs: 7 10*3/uL (ref 1.7–7.7)
Neutrophils Relative %: 72 %
Platelets: 314 10*3/uL (ref 150–400)
RBC: 4.56 MIL/uL (ref 3.87–5.11)
RDW: 14.7 % (ref 11.5–15.5)
WBC: 9.8 10*3/uL (ref 4.0–10.5)
nRBC: 0 % (ref 0.0–0.2)

## 2021-12-02 LAB — TSH: TSH: 1.927 u[IU]/mL (ref 0.350–4.500)

## 2021-12-02 MED ORDER — HEPARIN SOD (PORK) LOCK FLUSH 100 UNIT/ML IV SOLN
500.0000 [IU] | Freq: Once | INTRAVENOUS | Status: AC | PRN
  Administered 2021-12-02: 500 [IU]
  Filled 2021-12-02: qty 5

## 2021-12-02 MED ORDER — ONDANSETRON HCL 4 MG/2ML IJ SOLN
4.0000 mg | Freq: Once | INTRAMUSCULAR | Status: AC
  Administered 2021-12-02: 4 mg via INTRAVENOUS
  Filled 2021-12-02: qty 2

## 2021-12-02 MED ORDER — SODIUM CHLORIDE 0.9 % IV SOLN
200.0000 mg | Freq: Once | INTRAVENOUS | Status: AC
  Administered 2021-12-02: 200 mg via INTRAVENOUS
  Filled 2021-12-02: qty 200

## 2021-12-02 MED ORDER — SODIUM CHLORIDE 0.9 % IV SOLN
500.0000 mg/m2 | Freq: Once | INTRAVENOUS | Status: AC
  Administered 2021-12-02: 1000 mg via INTRAVENOUS
  Filled 2021-12-02: qty 40

## 2021-12-02 MED ORDER — SODIUM CHLORIDE 0.9 % IV SOLN
Freq: Once | INTRAVENOUS | Status: AC
  Filled 2021-12-02: qty 250

## 2021-12-02 NOTE — Progress Notes (Signed)
? ? ? ?Hematology/Oncology Consult note ?[place]  ?[phone] [phone] ? ?Patient Care Team: ?Remi Haggard, FNP as PCP - General (Family Medicine) ?Telford Nab, RN as Sales executive ?Noreene Filbert, MD as Radiation Oncologist (Radiation Oncology) ?Ottie Glazier, MD as Consulting Physician (Pulmonary Disease)  ? ?Name of the patient: Gina Sanchez  ?[id number]  ?September 04, 1966  ? ?Date of visit: 12/02/21 ? 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  ? ?Chief complaint/ Reason for visit-on treatment assessment prior to cycle 16 of maintenance Alimta and Keytruda and discuss CT scan results ? ?Heme/Onc history: Patient is a 56 year old female with a past medical history significant for hypertension hyperlipidemia and anxiety who presented with right thigh pain and was found to have an acute right proximal femoral shaft fracture.  She underwent operative fixation on 10/12/2020.  MRI of femur showed heterogeneously enhancing osseous lesion within the area which would be nonspecific versus office neoplasm or metastatic lesion.  CT chest abdomen and pelvis with contrast showed an enlarged pretracheal lymph node 2.2 x 1.5 cm and a right paratracheal lymph node measuring 2.7 x 1.3 cm.  Prevascular node measuring 0.3 x 0.9 cm.  5 x 4 mm right middle lobe nodule.  2.8 x 1.9 cm left adrenal lesion. ?   ?Reamings from the right femur showed metastatic poorly differentiated carcinoma.  Immunohistochemistry showed was positive for pancytokeratin, CK7 and patchy CK20 with patchy dim expression of TTF-1.  Cells negative for Melan-A, CDX2, PAX8, Napsin A, GATA3, p40, CD56, p16 and thyroglobulin.  Findings compatible with metastatic carcinoma but because of decalcification immunohistochemical staining is unreliable.  Patchy dim staining with TTF-1 suspicious for lung primary but not a definitive diagnosis. ?  ?Repeat  supraclavicular lymph node biopsy showed metastatic adenocarcinoma.  Tumor cells positive for CK7 with focal weak staining for TTF-1.  Suggestive of lung origin in the proper clinical context.  Cells were negative for GATA3 PAX8 CDX2 and CK20 and Napsin A.  Foundation 1 liquid biopsy showed  Showed K-ras G12 C, PIK3 CA, KDA P1C171F, KDM 5CE 296 ?  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  She is currently on hydrocortisone twice daily.  Thyroid functions presently are normal ?  ?  ? ?Interval history-tolerating treatments well so far.  Has baseline right hip discomfort for which she uses narcotic pain medications occasionally.  Denies any new complaints today ? ?ECOG PS- 1 ?Pain scale- 0 ?Opioid associated constipation- no ? ?Review of systems- Review of Systems  ?Constitutional:  Positive for malaise/fatigue. Negative for chills, fever and weight loss.  ?HENT:  Negative for congestion, ear discharge and nosebleeds.   ?Eyes:  Negative for blurred vision.  ?Respiratory:  Negative for cough, hemoptysis, sputum production, shortness of breath and wheezing.   ?Cardiovascular:  Negative for chest pain, palpitations, orthopnea and claudication.  ?Gastrointestinal:  Negative for abdominal pain, blood in stool, constipation, diarrhea, heartburn, melena, nausea and vomiting.  ?Genitourinary:  Negative for dysuria, flank pain, frequency, hematuria and urgency.  ?Musculoskeletal:  Negative for back pain, joint pain and myalgias.  ?     Right hip pain  ?Skin:  Negative for rash.  ?Neurological:  Negative for dizziness, tingling, focal weakness, seizures, weakness and headaches.  ?Endo/Heme/Allergies:  Does not bruise/bleed easily.  ?Psychiatric/Behavioral:  Negative for depression and suicidal ideas. The patient does not have insomnia.    ? ? ? ?Allergies  ?Allergen Reactions  ?  Factive [Gemifloxacin] Rash  ? ? ? ?Past Medical History:  ?Diagnosis Date  ? Abnormal Pap smear of  cervix   ? Anxiety   ? Depression   ? Diverticulosis   ? Essential hypertension   ? Femur fracture, right ([place])   ? GERD (gastroesophageal reflux disease)   ? Lung cancer ([place])   ? 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  ? Palpitations   ? Precordial chest pain   ? Wrist fracture 03/2021  ? left  ? ? ? ?Past Surgical History:  ?Procedure Laterality Date  ? BREAST BIOPSY Right 2017  ? benign  ? CERVICAL BIOPSY  W/ LOOP ELECTRODE EXCISION    ? COLONOSCOPY WITH PROPOFOL N/A 07/03/2015  ? Procedure: COLONOSCOPY WITH PROPOFOL;  Surgeon: Hulen Luster, MD;  Location: [facility] ENDOSCOPY;  Service: Gastroenterology;  Laterality: N/A;  ? ESOPHAGOGASTRODUODENOSCOPY (EGD) WITH PROPOFOL N/A 07/03/2015  ? Procedure: ESOPHAGOGASTRODUODENOSCOPY (EGD) WITH PROPOFOL;  Surgeon: Hulen Luster, MD;  Location: [facility] ENDOSCOPY;  Service: Gastroenterology;  Laterality: N/A;  ? INTRAMEDULLARY (IM) NAIL INTERTROCHANTERIC Right 09/15/2020  ? Procedure: INTRAMEDULLARY (IM) NAIL INTERTROCHANTRIC;  Surgeon: Corky Mull, MD;  Location: [facility] ORS;  Service: Orthopedics;  Laterality: Right;  ? IR IMAGING GUIDED PORT INSERTION  11/28/2020  ? LEFT HEART CATH AND CORONARY ANGIOGRAPHY N/A 03/28/2017  ? Procedure: Left Heart Cath and Coronary Angiography;  Surgeon: Lorretta Harp, MD;  Location: [place] CV LAB;  Service: Cardiovascular;  Laterality: N/A;  ? ORIF WRIST FRACTURE Left 03/31/2021  ? Procedure: OPEN REDUCTION INTERNAL FIXATION (ORIF) LEFT DISTAL RADIUS FRACTURE.;  Surgeon: Corky Mull, MD;  Location: [facility] ORS;  Service: Orthopedics;  Laterality: Left;  ? ? ?Social History  ? ?Socioeconomic History  ? Marital status: Married  ?  Spouse name: Gene  ? Number of children: 2  ? Years of education: Not on file  ? Highest education level: Not on file  ?Occupational History  ? Not on file  ?Tobacco Use  ? Smoking status: Former  ?  Packs/day: 1.00  ?  Years: 30.00  ?  Pack years: 30.00  ?  Types: Cigarettes  ?  Quit date: 09/17/2020  ?  Years since quitting:  1.2  ? Smokeless tobacco: Never  ?Vaping Use  ? Vaping Use: Never used  ?Substance and Sexual Activity  ? Alcohol use: Not Currently  ?  Alcohol/week: 1.0 standard drink  ?  Types: 1 Standard drinks or equivalent per week  ?  Comment: beer occ.  ? Drug use: No  ? Sexual activity: Yes  ?  Birth control/protection: Post-menopausal  ?Other Topics Concern  ? Not on file  ?Social History Narrative  ? Lives in [place] with her husband.  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  ? ?Social Determinants of Health  ? ?Financial Resource Strain: Not on file  ?Food Insecurity: Not on file  ?Transportation Needs: Not on file  ?Physical Activity: Not on file  ?Stress: Not on file  ?Social Connections: Not on file  ?Intimate Partner Violence: Not on file  ? ? ?Family History  ?Problem Relation Age of Onset  ? Diabetes Mother   ?     alive @ 18  ? Goiter Mother   ? Heart disease Father   ?     died of MI @ 56  ? Osteoporosis Maternal Grandmother   ? Colon cancer Maternal Grandfather   ? Breast cancer Neg Hx   ?  Ovarian cancer Neg Hx   ? ? ? ?Current Outpatient Medications:  ?  acetaminophen (TYLENOL) 325 MG tablet, Take 1-2 tablets (325-650 mg total) by mouth every 6 (six) hours as needed for mild pain (pain score 1-3 or temp > 100.5)., Disp: , Rfl:  ?  ALPRAZolam (XANAX) 0.5 MG tablet, Take 1 tablet (0.5 mg total) by mouth daily as needed., Disp: 30 tablet, Rfl: 0 ?  famotidine (PEPCID) 20 MG tablet, Take 20 mg by mouth in the morning., Disp: , Rfl:  ?  folic acid (FOLVITE) 1 MG tablet, Take 1 tablet (1 mg total) by mouth daily., Disp: 90 tablet, Rfl: 1 ?  hydrocortisone (CORTEF) 10 MG tablet, TAKE 1 TABLET BY MOUTH NIGHTLY, Disp: 30 tablet, Rfl: 1 ?  hydrocortisone (CORTEF) 20 MG tablet, TAKE 1 TABLET BY MOUTH EVERY MORNING, Disp: 30 tablet, Rfl: 1 ?  lidocaine-prilocaine (EMLA) cream, Apply to affected area once (Patient taking differently: Apply 1 application. topically as needed  (prior to port being accessed).), Disp: 30 g, Rfl: 3 ?  metoprolol tartrate (LOPRESSOR) 25 MG tablet, Take 25 mg by mouth in the morning., Disp: , Rfl:  ?  nitrofurantoin (MACRODANTIN) 100 MG capsule, Take 100 mg by

## 2021-12-02 NOTE — Addendum Note (Signed)
Addended by: Randa Evens C on: 12/02/2021 01:08 PM ? ? Modules accepted: Level of Service ? ?

## 2021-12-02 NOTE — Patient Instructions (Signed)
[facility] CANCER CTR AT [place]  Discharge Instructions: ?Thank you for choosing [facility] to provide your oncology and hematology care.  ?If you have a lab appointment with the [facility], please go directly to the [facility] and check in at the registration area. ? ?Wear comfortable clothing and clothing appropriate for easy access to any Portacath or PICC line.  ? ?We strive to give you quality time with your provider. You may need to reschedule your appointment if you arrive late (15 or more minutes).  Arriving late affects you and other patients whose appointments are after yours.  Also, if you miss three or more appointments without notifying the office, you may be dismissed from the clinic at the provider?s discretion.    ?  ?For prescription refill requests, have your pharmacy contact our office and allow 72 hours for refills to be completed.   ? ?Today you received the following chemotherapy and/or immunotherapy agents KEYTRUDA, ALIMTA,     ?  ?To help prevent nausea and vomiting after your treatment, we encourage you to take your nausea medication as directed. ? ?BELOW ARE SYMPTOMS THAT SHOULD BE REPORTED IMMEDIATELY: ?*FEVER GREATER THAN 100.4 F (38 ?C) OR HIGHER ?*CHILLS OR SWEATING ?*NAUSEA AND VOMITING THAT IS NOT CONTROLLED WITH YOUR NAUSEA MEDICATION ?*UNUSUAL SHORTNESS OF BREATH ?*UNUSUAL BRUISING OR BLEEDING ?*URINARY PROBLEMS (pain or burning when urinating, or frequent urination) ?*BOWEL PROBLEMS (unusual diarrhea, constipation, pain near the anus) ?TENDERNESS IN MOUTH AND THROAT WITH OR WITHOUT PRESENCE OF ULCERS (sore throat, sores in mouth, or a toothache) ?UNUSUAL RASH, SWELLING OR PAIN  ?UNUSUAL VAGINAL DISCHARGE OR ITCHING  ? ?Items with * indicate a potential emergency and should be followed up as soon as possible or go to the Emergency Department if any problems should occur. ? ?Please show the CHEMOTHERAPY ALERT CARD or IMMUNOTHERAPY ALERT CARD at  check-in to the Emergency Department and triage nurse. ? ?Should you have questions after your visit or need to cancel or reschedule your appointment, please contact [facility] CANCER [place] AT [place]  [phone] and follow the prompts.  Office hours are 8:00 a.m. to 4:30 p.m. Monday - Friday. Please note that voicemails left after 4:00 p.m. may not be returned until the following business day.  We are closed weekends and major holidays. You have access to a nurse at all times for urgent questions. Please call the main number to the clinic [phone] and follow the prompts. ? ?For any non-urgent questions, you may also contact your provider using MyChart. We now offer e-Visits for anyone 73 and older to request care online for non-urgent symptoms. For details visit [email or web address]. ?  ?Also download the MyChart app! Go to the app store, search "MyChart", open the app, select [place], and log in with your MyChart username and password. ? ?Due to Covid, a mask is required upon entering the hospital/clinic. If you do not have a mask, one will be given to you upon arrival. For doctor visits, patients may have 1 support person aged 69 or older with them. For treatment visits, patients cannot have anyone with them due to current Covid guidelines and our immunocompromised population.  ? ?Pembrolizumab injection ?What is this medication? ?PEMBROLIZUMAB (pem broe liz ue mab) is a monoclonal antibody. It is used to treat certain types of cancer. ?This medicine may be used for other purposes; ask your health care provider or pharmacist if you have questions. ?COMMON BRAND NAME(S): Keytruda ?What should I tell my  care team before I take this medication? ?They need to know if you have any of these conditions: ?autoimmune diseases like Crohn's disease, ulcerative colitis, or lupus ?have had or planning to have an allogeneic stem cell transplant (uses someone else's stem cells) ?history of organ  transplant ?history of chest radiation ?nervous system problems like myasthenia gravis or Guillain-Barre syndrome ?an unusual or allergic reaction to pembrolizumab, other medicines, foods, dyes, or preservatives ?pregnant or trying to get pregnant ?breast-feeding ?How should I use this medication? ?This medicine is for infusion into a vein. It is given by a health care professional in a hospital or clinic setting. ?A special MedGuide will be given to you before each treatment. Be sure to read this information carefully each time. ?Talk to your pediatrician regarding the use of this medicine in children. While this drug may be prescribed for children as young as 6 months for selected conditions, precautions do apply. ?Overdosage: If you think you have taken too much of this medicine contact a poison control center or emergency room at once. ?NOTE: This medicine is only for you. Do not share this medicine with others. ?What if I miss a dose? ?It is important not to miss your dose. Call your doctor or health care professional if you are unable to keep an appointment. ?What may interact with this medication? ?Interactions have not been studied. ?This list may not describe all possible interactions. Give your health care provider a list of all the medicines, herbs, non-prescription drugs, or dietary supplements you use. Also tell them if you smoke, drink alcohol, or use illegal drugs. Some items may interact with your medicine. ?What should I watch for while using this medication? ?Your condition will be monitored carefully while you are receiving this medicine. ?You may need blood work done while you are taking this medicine. ?Do not become pregnant while taking this medicine or for 4 months after stopping it. Women should inform their doctor if they wish to become pregnant or think they might be pregnant. There is a potential for serious side effects to an unborn child. Talk to your health care professional or  pharmacist for more information. Do not breast-feed an infant while taking this medicine or for 4 months after the last dose. ?What side effects may I notice from receiving this medication? ?Side effects that you should report to your doctor or health care professional as soon as possible: ?allergic reactions like skin rash, itching or hives, swelling of the face, lips, or tongue ?bloody or black, tarry ?breathing problems ?changes in vision ?chest pain ?chills ?confusion ?constipation ?cough ?diarrhea ?dizziness or feeling faint or lightheaded ?fast or irregular heartbeat ?fever ?flushing ?joint pain ?low blood counts - this medicine may decrease the number of white blood cells, red blood cells and platelets. You may be at increased risk for infections and bleeding. ?muscle pain ?muscle weakness ?pain, tingling, numbness in the hands or feet ?persistent headache ?redness, blistering, peeling or loosening of the skin, including inside the mouth ?signs and symptoms of high blood sugar such as dizziness; dry mouth; dry skin; fruity breath; nausea; stomach pain; increased hunger or thirst; increased urination ?signs and symptoms of kidney injury like trouble passing urine or change in the amount of urine ?signs and symptoms of liver injury like dark urine, light-colored stools, loss of appetite, nausea, right upper belly pain, yellowing of the eyes or skin ?sweating ?swollen lymph nodes ?weight loss ?Side effects that usually do not require medical attention (report to your  doctor or health care professional if they continue or are bothersome): ?decreased appetite ?hair loss ?tiredness ?This list may not describe all possible side effects. Call your doctor for medical advice about side effects. You may report side effects to FDA at 1-800-FDA-1088. ?Where should I keep my medication? ?This drug is given in a hospital or clinic and will not be stored at home. ?NOTE: This sheet is a summary. It may not cover all possible  information. If you have questions about this medicine, talk to your doctor, pharmacist, or health care provider. ?? 2022 Elsevier/Gold Standard (2021-05-19 00:00:00) ? ?Pemetrexed injection ?What is this medication? ?PEM

## 2021-12-02 NOTE — Progress Notes (Signed)
Pt currently on atb for UTI.  And wants to do better with eating and exercise to help loose the weight.  ?

## 2021-12-11 ENCOUNTER — Other Ambulatory Visit: Payer: Self-pay | Admitting: Oncology

## 2021-12-14 ENCOUNTER — Telehealth: Payer: Self-pay | Admitting: Oncology

## 2021-12-14 NOTE — Telephone Encounter (Signed)
Patient called to r/s her appointments from 4/12 to 4/21. She stated that she will be going out of town.  ? ?Routing to clinical team to make aware as patient is getting treatment that day.  ?  ?

## 2021-12-21 ENCOUNTER — Other Ambulatory Visit: Payer: Self-pay | Admitting: Oncology

## 2021-12-22 ENCOUNTER — Encounter: Payer: Self-pay | Admitting: Oncology

## 2021-12-23 ENCOUNTER — Other Ambulatory Visit: Payer: 59

## 2021-12-23 ENCOUNTER — Other Ambulatory Visit: Payer: Self-pay | Admitting: *Deleted

## 2021-12-23 ENCOUNTER — Other Ambulatory Visit: Payer: Self-pay | Admitting: Oncology

## 2021-12-23 ENCOUNTER — Ambulatory Visit: Payer: 59

## 2021-12-23 ENCOUNTER — Encounter: Payer: Self-pay | Admitting: Oncology

## 2021-12-23 ENCOUNTER — Ambulatory Visit: Payer: 59 | Admitting: Oncology

## 2021-12-23 ENCOUNTER — Other Ambulatory Visit: Payer: Self-pay

## 2021-12-23 DIAGNOSIS — Z87891 Personal history of nicotine dependence: Secondary | ICD-10-CM | POA: Insufficient documentation

## 2021-12-23 DIAGNOSIS — E785 Hyperlipidemia, unspecified: Secondary | ICD-10-CM | POA: Insufficient documentation

## 2021-12-23 DIAGNOSIS — M79651 Pain in right thigh: Secondary | ICD-10-CM | POA: Insufficient documentation

## 2021-12-23 DIAGNOSIS — R748 Abnormal levels of other serum enzymes: Secondary | ICD-10-CM | POA: Diagnosis not present

## 2021-12-23 DIAGNOSIS — E2740 Unspecified adrenocortical insufficiency: Secondary | ICD-10-CM | POA: Insufficient documentation

## 2021-12-23 DIAGNOSIS — C7951 Secondary malignant neoplasm of bone: Secondary | ICD-10-CM | POA: Insufficient documentation

## 2021-12-23 DIAGNOSIS — R197 Diarrhea, unspecified: Secondary | ICD-10-CM

## 2021-12-23 DIAGNOSIS — Z7984 Long term (current) use of oral hypoglycemic drugs: Secondary | ICD-10-CM | POA: Insufficient documentation

## 2021-12-23 DIAGNOSIS — Z5112 Encounter for antineoplastic immunotherapy: Secondary | ICD-10-CM | POA: Insufficient documentation

## 2021-12-23 DIAGNOSIS — C7970 Secondary malignant neoplasm of unspecified adrenal gland: Secondary | ICD-10-CM | POA: Insufficient documentation

## 2021-12-23 DIAGNOSIS — Z5111 Encounter for antineoplastic chemotherapy: Secondary | ICD-10-CM | POA: Insufficient documentation

## 2021-12-23 DIAGNOSIS — C3491 Malignant neoplasm of unspecified part of right bronchus or lung: Secondary | ICD-10-CM | POA: Insufficient documentation

## 2021-12-23 DIAGNOSIS — K219 Gastro-esophageal reflux disease without esophagitis: Secondary | ICD-10-CM | POA: Insufficient documentation

## 2021-12-23 DIAGNOSIS — I10 Essential (primary) hypertension: Secondary | ICD-10-CM | POA: Insufficient documentation

## 2021-12-23 DIAGNOSIS — F419 Anxiety disorder, unspecified: Secondary | ICD-10-CM | POA: Insufficient documentation

## 2021-12-23 LAB — C DIFFICILE QUICK SCREEN W PCR REFLEX
C Diff antigen: NEGATIVE
C Diff interpretation: NOT DETECTED
C Diff toxin: NEGATIVE

## 2021-12-25 LAB — GI PATHOGEN PANEL BY PCR, STOOL

## 2021-12-31 ENCOUNTER — Other Ambulatory Visit: Payer: Self-pay | Admitting: Oncology

## 2022-01-01 ENCOUNTER — Inpatient Hospital Stay: Payer: 59

## 2022-01-01 ENCOUNTER — Encounter: Payer: Self-pay | Admitting: Oncology

## 2022-01-01 ENCOUNTER — Inpatient Hospital Stay: Payer: 59 | Admitting: Oncology

## 2022-01-01 ENCOUNTER — Inpatient Hospital Stay: Payer: 59 | Attending: Oncology

## 2022-01-01 VITALS — BP 108/88 | HR 80 | Temp 98.5°F | Resp 18 | Wt 229.5 lb

## 2022-01-01 DIAGNOSIS — Z7984 Long term (current) use of oral hypoglycemic drugs: Secondary | ICD-10-CM | POA: Diagnosis not present

## 2022-01-01 DIAGNOSIS — F419 Anxiety disorder, unspecified: Secondary | ICD-10-CM | POA: Diagnosis not present

## 2022-01-01 DIAGNOSIS — R197 Diarrhea, unspecified: Secondary | ICD-10-CM

## 2022-01-01 DIAGNOSIS — Z5111 Encounter for antineoplastic chemotherapy: Secondary | ICD-10-CM | POA: Insufficient documentation

## 2022-01-01 DIAGNOSIS — Z87891 Personal history of nicotine dependence: Secondary | ICD-10-CM | POA: Insufficient documentation

## 2022-01-01 DIAGNOSIS — E2740 Unspecified adrenocortical insufficiency: Secondary | ICD-10-CM | POA: Insufficient documentation

## 2022-01-01 DIAGNOSIS — R109 Unspecified abdominal pain: Secondary | ICD-10-CM | POA: Diagnosis not present

## 2022-01-01 DIAGNOSIS — C3491 Malignant neoplasm of unspecified part of right bronchus or lung: Secondary | ICD-10-CM

## 2022-01-01 DIAGNOSIS — M79651 Pain in right thigh: Secondary | ICD-10-CM | POA: Insufficient documentation

## 2022-01-01 DIAGNOSIS — E785 Hyperlipidemia, unspecified: Secondary | ICD-10-CM | POA: Diagnosis not present

## 2022-01-01 DIAGNOSIS — R69 Illness, unspecified: Secondary | ICD-10-CM | POA: Diagnosis not present

## 2022-01-01 DIAGNOSIS — C7951 Secondary malignant neoplasm of bone: Secondary | ICD-10-CM | POA: Diagnosis not present

## 2022-01-01 DIAGNOSIS — Z5112 Encounter for antineoplastic immunotherapy: Secondary | ICD-10-CM | POA: Insufficient documentation

## 2022-01-01 DIAGNOSIS — I10 Essential (primary) hypertension: Secondary | ICD-10-CM | POA: Insufficient documentation

## 2022-01-01 DIAGNOSIS — K219 Gastro-esophageal reflux disease without esophagitis: Secondary | ICD-10-CM | POA: Insufficient documentation

## 2022-01-01 DIAGNOSIS — C7970 Secondary malignant neoplasm of unspecified adrenal gland: Secondary | ICD-10-CM | POA: Diagnosis not present

## 2022-01-01 LAB — CBC WITH DIFFERENTIAL/PLATELET
Abs Immature Granulocytes: 0.04 10*3/uL (ref 0.00–0.07)
Basophils Absolute: 0.1 10*3/uL (ref 0.0–0.1)
Basophils Relative: 1 %
Eosinophils Absolute: 0.3 10*3/uL (ref 0.0–0.5)
Eosinophils Relative: 4 %
HCT: 40.3 % (ref 36.0–46.0)
Hemoglobin: 13.4 g/dL (ref 12.0–15.0)
Immature Granulocytes: 1 %
Lymphocytes Relative: 17 %
Lymphs Abs: 1.5 10*3/uL (ref 0.7–4.0)
MCH: 29.5 pg (ref 26.0–34.0)
MCHC: 33.3 g/dL (ref 30.0–36.0)
MCV: 88.6 fL (ref 80.0–100.0)
Monocytes Absolute: 0.9 10*3/uL (ref 0.1–1.0)
Monocytes Relative: 11 %
Neutro Abs: 5.9 10*3/uL (ref 1.7–7.7)
Neutrophils Relative %: 66 %
Platelets: 254 10*3/uL (ref 150–400)
RBC: 4.55 MIL/uL (ref 3.87–5.11)
RDW: 14.6 % (ref 11.5–15.5)
WBC: 8.8 10*3/uL (ref 4.0–10.5)
nRBC: 0 % (ref 0.0–0.2)

## 2022-01-01 LAB — COMPREHENSIVE METABOLIC PANEL
ALT: 25 U/L (ref 0–44)
AST: 24 U/L (ref 15–41)
Albumin: 3.3 g/dL — ABNORMAL LOW (ref 3.5–5.0)
Alkaline Phosphatase: 73 U/L (ref 38–126)
Anion gap: 8 (ref 5–15)
BUN: 13 mg/dL (ref 6–20)
CO2: 23 mmol/L (ref 22–32)
Calcium: 8.2 mg/dL — ABNORMAL LOW (ref 8.9–10.3)
Chloride: 103 mmol/L (ref 98–111)
Creatinine, Ser: 0.74 mg/dL (ref 0.44–1.00)
GFR, Estimated: >60 mL/min (ref >60)
Glucose, Bld: 158 mg/dL — ABNORMAL HIGH (ref 70–99)
Potassium: 3.6 mmol/L (ref 3.5–5.1)
Sodium: 134 mmol/L — ABNORMAL LOW (ref 135–145)
Total Bilirubin: 0.7 mg/dL (ref 0.3–1.2)
Total Protein: 6.7 g/dL (ref 6.5–8.1)

## 2022-01-01 MED ORDER — HEPARIN SOD (PORK) LOCK FLUSH 100 UNIT/ML IV SOLN
500.0000 [IU] | Freq: Once | INTRAVENOUS | Status: AC | PRN
  Filled 2022-01-01: qty 5

## 2022-01-01 MED ORDER — SODIUM CHLORIDE 0.9 % IV SOLN
Freq: Once | INTRAVENOUS | Status: AC
  Filled 2022-01-01: qty 250

## 2022-01-01 MED ORDER — SODIUM CHLORIDE 0.9 % IV SOLN
200.0000 mg | Freq: Once | INTRAVENOUS | Status: AC
  Administered 2022-01-01: 200 mg via INTRAVENOUS
  Filled 2022-01-01: qty 8

## 2022-01-01 MED ORDER — ONDANSETRON HCL 4 MG/2ML IJ SOLN
4.0000 mg | Freq: Once | INTRAMUSCULAR | Status: AC
  Administered 2022-01-01: 4 mg via INTRAVENOUS
  Filled 2022-01-01: qty 2

## 2022-01-01 MED ORDER — HEPARIN SOD (PORK) LOCK FLUSH 100 UNIT/ML IV SOLN
INTRAVENOUS | Status: AC
  Administered 2022-01-01: 500 [IU]
  Filled 2022-01-01: qty 5

## 2022-01-01 MED ORDER — CYANOCOBALAMIN 1000 MCG/ML IJ SOLN
1000.0000 ug | INTRAMUSCULAR | Status: DC
  Administered 2022-01-01: 1000 ug via INTRAMUSCULAR
  Filled 2022-01-01: qty 1

## 2022-01-01 MED ORDER — SODIUM CHLORIDE 0.9 % IV SOLN
500.0000 mg/m2 | Freq: Once | INTRAVENOUS | Status: AC
  Administered 2022-01-01: 1000 mg via INTRAVENOUS
  Filled 2022-01-01: qty 40

## 2022-01-01 NOTE — Progress Notes (Signed)
Pt states she has been having stomach issues for the past week: diarrhea: brought a stool sample last week but everything was negative but she is still having issues. ANALYSE was normal but it went up to 139. ? ?

## 2022-01-01 NOTE — Patient Instructions (Signed)
[facility] CANCER CTR AT [place]  Discharge Instructions: ?Thank you for choosing [facility] [place] to provide your oncology and hematology care.  ?If you have a lab appointment with the [facility], please go directly to the [facility] and check in at the registration area. ? ?Wear comfortable clothing and clothing appropriate for easy access to any Portacath or PICC line.  ? ?We strive to give you quality time with your provider. You may need to reschedule your appointment if you arrive late (15 or more minutes).  Arriving late affects you and other patients whose appointments are after yours.  Also, if you miss three or more appointments without notifying the office, you may be dismissed from the clinic at the provider?s discretion.    ?  ?For prescription refill requests, have your pharmacy contact our office and allow 72 hours for refills to be completed.   ? ?Today you received the following chemotherapy and/or immunotherapy agents keytruda, alimta  ?  ?To help prevent nausea and vomiting after your treatment, we encourage you to take your nausea medication as directed. ? ?BELOW ARE SYMPTOMS THAT SHOULD BE REPORTED IMMEDIATELY: ?*FEVER GREATER THAN 100.4 F (38 ?C) OR HIGHER ?*CHILLS OR SWEATING ?*NAUSEA AND VOMITING THAT IS NOT CONTROLLED WITH YOUR NAUSEA MEDICATION ?*UNUSUAL SHORTNESS OF BREATH ?*UNUSUAL BRUISING OR BLEEDING ?*URINARY PROBLEMS (pain or burning when urinating, or frequent urination) ?*BOWEL PROBLEMS (unusual diarrhea, constipation, pain near the anus) ?TENDERNESS IN MOUTH AND THROAT WITH OR WITHOUT PRESENCE OF ULCERS (sore throat, sores in mouth, or a toothache) ?UNUSUAL RASH, SWELLING OR PAIN  ?UNUSUAL VAGINAL DISCHARGE OR ITCHING  ? ?Items with * indicate a potential emergency and should be followed up as soon as possible or go to the Emergency Department if any problems should occur. ? ?Please show the CHEMOTHERAPY ALERT CARD or IMMUNOTHERAPY ALERT CARD at check-in  to the Emergency Department and triage nurse. ? ?Should you have questions after your visit or need to cancel or reschedule your appointment, please contact [facility] CANCER [place] AT [place]  [phone] and follow the prompts.  Office hours are 8:00 a.m. to 4:30 p.m. Monday - Friday. Please note that voicemails left after 4:00 p.m. may not be returned until the following business day.  We are closed weekends and major holidays. You have access to a nurse at all times for urgent questions. Please call the main number to the clinic [phone] and follow the prompts. ? ?For any non-urgent questions, you may also contact your provider using MyChart. We now offer e-Visits for anyone 60 and older to request care online for non-urgent symptoms. For details visit [email or web address]. ?  ?Also download the MyChart app! Go to the app store, search "MyChart", open the app, select [place], and log in with your MyChart username and password. ? ?Due to Covid, a mask is required upon entering the hospital/clinic. If you do not have a mask, one will be given to you upon arrival. For doctor visits, patients may have 1 support person aged 53 or older with them. For treatment visits, patients cannot have anyone with them due to current Covid guidelines and our immunocompromised population.  ?

## 2022-01-01 NOTE — Progress Notes (Signed)
? ? ? ?Hematology/Oncology Consult note ?[place]  ?[phone] [phone] ? ?Patient Care Team: ?Remi Haggard, FNP as PCP - General (Family Medicine) ?Telford Nab, RN as Sales executive ?Noreene Filbert, MD as Radiation Oncologist (Radiation Oncology) ?Ottie Glazier, MD as Consulting Physician (Pulmonary Disease)  ? ?Name of the patient: Gina Sanchez  ?[id number]  ?1966-03-26  ? ?Date of visit: 01/01/22 ? 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 ?Chief complaint/ Reason for visit-on treatment assessment prior to cycle 17 of maintenance Alimta and Keytruda ? ?Heme/Onc history: Patient is a 56 year old female with a past medical history significant for hypertension hyperlipidemia and anxiety who presented with right thigh pain and was found to have an acute right proximal femoral shaft fracture.  She underwent operative fixation on 10/12/2020.  MRI of femur showed heterogeneously enhancing osseous lesion within the area which would be nonspecific versus office neoplasm or metastatic lesion.  CT chest abdomen and pelvis with contrast showed an enlarged pretracheal lymph node 2.2 x 1.5 cm and a right paratracheal lymph node measuring 2.7 x 1.3 cm.  Prevascular node measuring 0.3 x 0.9 cm.  5 x 4 mm right middle lobe nodule.  2.8 x 1.9 cm left adrenal lesion. ?   ?Reamings from the right femur showed metastatic poorly differentiated carcinoma.  Immunohistochemistry showed was positive for pancytokeratin, CK7 and patchy CK20 with patchy dim expression of TTF-1.  Cells negative for Melan-A, CDX2, PAX8, Napsin A, GATA3, p40, CD56, p16 and thyroglobulin.  Findings compatible with metastatic carcinoma but because of decalcification immunohistochemical staining is unreliable.  Patchy dim staining with TTF-1 suspicious for lung primary but not a definitive diagnosis. ?  ?Repeat supraclavicular lymph node biopsy showed  metastatic adenocarcinoma.  Tumor cells positive for CK7 with focal weak staining for TTF-1.  Suggestive of lung origin in the proper clinical context.  Cells were negative for GATA3 PAX8 CDX2 and CK20 and Napsin A.  Foundation 1 liquid biopsy showed  Showed K-ras G12 C, PIK3 CA, KDA P1C171F, KDM 5CE 296 ?  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  She is currently on hydrocortisone twice daily.  Thyroid functions presently are normal ?  ?  ? Interval history-patient states that she has been having small frequent episodes of loose bowel movements with mainly mucus over the last 3 to 4 weeks.  Denies any blood in her stool.  She has been able to function and carry out her ADLs and IADLs without any side effects. ? ?ECOG PS- 1 ?Pain scale- 0 ? ?Review of systems- Review of Systems  ?Constitutional:  Negative for chills, fever, malaise/fatigue and weight loss.  ?HENT:  Negative for congestion, ear discharge and nosebleeds.   ?Eyes:  Negative for blurred vision.  ?Respiratory:  Negative for cough, hemoptysis, sputum production, shortness of breath and wheezing.   ?Cardiovascular:  Negative for chest pain, palpitations, orthopnea and claudication.  ?Gastrointestinal:  Positive for diarrhea. Negative for abdominal pain, blood in stool, constipation, heartburn, melena, nausea and vomiting.  ?Genitourinary:  Negative for dysuria, flank pain, frequency, hematuria and urgency.  ?Musculoskeletal:  Negative for back pain, joint pain and myalgias.  ?Skin:  Negative for rash.  ?Neurological:  Negative for dizziness, tingling, focal weakness, seizures, weakness and headaches.  ?Endo/Heme/Allergies:  Does not bruise/bleed easily.  ?Psychiatric/Behavioral:  Negative for depression and suicidal ideas. The patient does not have insomnia.    ? ? ? ?Allergies  ?  Allergen Reactions  ? Factive [Gemifloxacin] Rash  ? ? ? ?Past Medical History:  ?Diagnosis Date  ? Abnormal Pap smear of cervix    ? Anxiety   ? Depression   ? Diverticulosis   ? Essential hypertension   ? Femur fracture, right ([place])   ? GERD (gastroesophageal reflux disease)   ? Lung cancer ([place])   ? 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  ? Palpitations   ? Precordial chest pain   ? Wrist fracture 03/2021  ? left  ? ? ? ?Past Surgical History:  ?Procedure Laterality Date  ? BREAST BIOPSY Right 2017  ? benign  ? CERVICAL BIOPSY  W/ LOOP ELECTRODE EXCISION    ? COLONOSCOPY WITH PROPOFOL N/A 07/03/2015  ? Procedure: COLONOSCOPY WITH PROPOFOL;  Surgeon: Hulen Luster, MD;  Location: [facility] ENDOSCOPY;  Service: Gastroenterology;  Laterality: N/A;  ? ESOPHAGOGASTRODUODENOSCOPY (EGD) WITH PROPOFOL N/A 07/03/2015  ? Procedure: ESOPHAGOGASTRODUODENOSCOPY (EGD) WITH PROPOFOL;  Surgeon: Hulen Luster, MD;  Location: [facility] ENDOSCOPY;  Service: Gastroenterology;  Laterality: N/A;  ? INTRAMEDULLARY (IM) NAIL INTERTROCHANTERIC Right 09/15/2020  ? Procedure: INTRAMEDULLARY (IM) NAIL INTERTROCHANTRIC;  Surgeon: Corky Mull, MD;  Location: [facility] ORS;  Service: Orthopedics;  Laterality: Right;  ? IR IMAGING GUIDED PORT INSERTION  11/28/2020  ? LEFT HEART CATH AND CORONARY ANGIOGRAPHY N/A 03/28/2017  ? Procedure: Left Heart Cath and Coronary Angiography;  Surgeon: Lorretta Harp, MD;  Location: [place] CV LAB;  Service: Cardiovascular;  Laterality: N/A;  ? ORIF WRIST FRACTURE Left 03/31/2021  ? Procedure: OPEN REDUCTION INTERNAL FIXATION (ORIF) LEFT DISTAL RADIUS FRACTURE.;  Surgeon: Corky Mull, MD;  Location: [facility] ORS;  Service: Orthopedics;  Laterality: Left;  ? ? ?Social History  ? ?Socioeconomic History  ? Marital status: Married  ?  Spouse name: Gene  ? Number of children: 2  ? Years of education: Not on file  ? Highest education level: Not on file  ?Occupational History  ? Not on file  ?Tobacco Use  ? Smoking status: Former  ?  Packs/day: 1.00  ?  Years: 30.00  ?  Pack years: 30.00  ?  Types: Cigarettes  ?  Quit date: 09/17/2020  ?  Years since quitting: 1.2  ?  Smokeless tobacco: Never  ?Vaping Use  ? Vaping Use: Never used  ?Substance and Sexual Activity  ? Alcohol use: Not Currently  ?  Alcohol/week: 1.0 standard drink  ?  Types: 1 Standard drinks or equivalent per week  ?  Comment: beer occ.  ? Drug use: No  ? Sexual activity: Yes  ?  Birth control/protection: Post-menopausal  ?Other Topics Concern  ? Not on file  ?Social History Narrative  ? Lives in [place] with her husband.  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  ? ?Social Determinants of Health  ? ?Financial Resource Strain: Not on file  ?Food Insecurity: Not on file  ?Transportation Needs: Not on file  ?Physical Activity: Not on file  ?Stress: Not on file  ?Social Connections: Not on file  ?Intimate Partner Violence: Not on file  ? ? ?Family History  ?Problem Relation Age of Onset  ? Diabetes Mother   ?     alive @ 30  ? Goiter Mother   ? Heart disease Father   ?     died of MI @ 80  ? Osteoporosis Maternal Grandmother   ? Colon cancer Maternal Grandfather   ? Breast cancer  Neg Hx   ? Ovarian cancer Neg Hx   ? ? ? ?Current Outpatient Medications:  ?  acetaminophen (TYLENOL) 325 MG tablet, Take 1-2 tablets (325-650 mg total) by mouth every 6 (six) hours as needed for mild pain (pain score 1-3 or temp > 100.5)., Disp: , Rfl:  ?  ALPRAZolam (XANAX) 0.5 MG tablet, Take 1 tablet (0.5 mg total) by mouth daily as needed., Disp: 30 tablet, Rfl: 0 ?  ciprofloxacin (CIPRO) 500 MG tablet, SMARTSIG:1 Tablet(s) By Mouth Every 12 Hours, Disp: , Rfl:  ?  famotidine (PEPCID) 20 MG tablet, Take 20 mg by mouth in the morning., Disp: , Rfl:  ?  folic acid (FOLVITE) 1 MG tablet, Take 1 tablet (1 mg total) by mouth daily., Disp: 90 tablet, Rfl: 1 ?  hydrocortisone (CORTEF) 10 MG tablet, TAKE 1 TABLET BY MOUTH NIGHTLY, Disp: 30 tablet, Rfl: 1 ?  hydrocortisone (CORTEF) 20 MG tablet, TAKE 1 TABLET BY MOUTH EVERY MORNING, Disp: 30 tablet, Rfl: 1 ?  metoprolol tartrate (LOPRESSOR) 25 MG  tablet, Take 25 mg by mouth in the morning., Disp: , Rfl:  ?  metroNIDAZOLE (FLAGYL) 500 MG tablet, Take 500 mg by mouth every 8 (eight) hours., Disp: , Rfl:  ?  ondansetron (ZOFRAN) 4 MG tablet, TAKE 1 TAB

## 2022-01-03 ENCOUNTER — Emergency Department
Admission: EM | Admit: 2022-01-03 | Discharge: 2022-01-03 | Disposition: A | Discharge status: Home / Self Care | Payer: 59 | Attending: Emergency Medicine | Admitting: Emergency Medicine

## 2022-01-03 ENCOUNTER — Other Ambulatory Visit: Payer: Self-pay

## 2022-01-03 DIAGNOSIS — Z85118 Personal history of other malignant neoplasm of bronchus and lung: Secondary | ICD-10-CM | POA: Insufficient documentation

## 2022-01-03 DIAGNOSIS — R197 Diarrhea, unspecified: Secondary | ICD-10-CM | POA: Diagnosis not present

## 2022-01-03 DIAGNOSIS — K625 Hemorrhage of anus and rectum: Secondary | ICD-10-CM | POA: Insufficient documentation

## 2022-01-03 LAB — CBC
HCT: 44.7 % (ref 36.0–46.0)
Hemoglobin: 14.3 g/dL (ref 12.0–15.0)
MCH: 28.6 pg (ref 26.0–34.0)
MCHC: 32 g/dL (ref 30.0–36.0)
MCV: 89.4 fL (ref 80.0–100.0)
Platelets: 243 10*3/uL (ref 150–400)
RBC: 5 MIL/uL (ref 3.87–5.11)
RDW: 14.6 % (ref 11.5–15.5)
WBC: 9.1 10*3/uL (ref 4.0–10.5)
nRBC: 0 % (ref 0.0–0.2)

## 2022-01-03 LAB — COMPREHENSIVE METABOLIC PANEL
ALT: 26 U/L (ref 0–44)
AST: 22 U/L (ref 15–41)
Albumin: 3.5 g/dL (ref 3.5–5.0)
Alkaline Phosphatase: 74 U/L (ref 38–126)
Anion gap: 8 (ref 5–15)
BUN: 11 mg/dL (ref 6–20)
CO2: 24 mmol/L (ref 22–32)
Calcium: 8.5 mg/dL — ABNORMAL LOW (ref 8.9–10.3)
Chloride: 103 mmol/L (ref 98–111)
Creatinine, Ser: 0.88 mg/dL (ref 0.44–1.00)
GFR, Estimated: >60 mL/min (ref >60)
Glucose, Bld: 150 mg/dL — ABNORMAL HIGH (ref 70–99)
Potassium: 3.7 mmol/L (ref 3.5–5.1)
Sodium: 135 mmol/L (ref 135–145)
Total Bilirubin: 1 mg/dL (ref 0.3–1.2)
Total Protein: 7.2 g/dL (ref 6.5–8.1)

## 2022-01-03 LAB — TYPE AND SCREEN
ABO/RH(D): AB NEG
Antibody Screen: NEGATIVE

## 2022-01-03 MED ORDER — SODIUM CHLORIDE 0.9 % IV SOLN: INTRAVENOUS | Status: DC

## 2022-01-03 NOTE — ED Notes (Signed)
This RN at bedside with Dr. Cinda Quest, assisted with bedside rectal exam. + hemoccult test ?

## 2022-01-03 NOTE — ED Provider Notes (Signed)
? ?[facility] ?Provider Note ? ? ? Event Date/Time  ? First MD Initiated Contact with Patient 01/03/22 [phone]   ?  (approximate) ? ? ?History  ? ?Rectal Bleeding ? ? ? ?JESLYNN HOLLANDER is a 56 y.o. female who has been having some mucousy mild diarrheal stools for about a month.  She is seeing Dr. Janese Banks for lung cancer and is on [place] and 1 other medicine.  Patient just started having blood in the diarrhea.  In fact the last couple stools were just blood.  I discussed the patient with Dr. Janese Banks.  Dr. Janese Banks says the [place] Flock can cause an autoimmune colitis.  Generally GI would do a flex sig on the patient and see and would start her on steroids if that was the case.  Patient had a CT on March 1 that did not show any problems. ? ?  ? ? ?Physical Exam  ? ?Triage Vital Signs: ?ED Triage Vitals  ?Enc Vitals Group  ?   BP 01/03/22 0838 (!) 130/114  ?   Pulse Rate 01/03/22 0838 (!) 108  ?   Resp 01/03/22 0838 18  ?   Temp 01/03/22 0838 98 ?F (36.7 ?C)  ?   Temp src --   ?   SpO2 01/03/22 0838 97 %  ?   Weight --   ?   Height --   ?   Head Circumference --   ?   Peak Flow --   ?   Pain Score 01/03/22 0835 2  ?   Pain Loc --   ?   Pain Edu? --   ?   Excl. in [place]? --   ? ? ?Most recent vital signs: ?Vitals:  ? 01/03/22 0927 01/03/22 1000  ?BP: 126/82 124/89  ?Pulse: 99 97  ?Resp: 20 20  ?Temp:    ?SpO2: 95% 94%  ? ? ? ?General: Awake, no distress.  ?Head normocephalic atraumatic ?Mouth no erythema or exudate ?CV:  Good peripheral perfusion.  Heart regular rate and rhythm no audible murmurs ?Resp:  Normal effort.  Lungs clear ?Abd:  No distention.  Soft bowel sounds positive there is minimal lower abdominal tenderness. ?Rectal: No obvious hemorrhoids on the exterior.  None are palpated.  There is very little stool in the colon but what is there turns very rapidly dark blue Hemoccult positive. ? ? ?ED Results / Procedures / Treatments  ? ?Labs ?(all labs ordered are listed, but only abnormal results are  displayed) ?Labs Reviewed  ?COMPREHENSIVE METABOLIC PANEL - Abnormal; Notable for the following components:  ?    Result Value  ? Glucose, Bld 150 (*)   ? Calcium 8.5 (*)   ? All other components within normal limits  ?CBC  ?POC OCCULT BLOOD, ED  ?TYPE AND SCREEN  ? ? ? ?EKG ? ? ? ? ?RADIOLOGY ? ? ?PROCEDURES: ? ?Critical Care performed:  ? ?Procedures ? ? ?MEDICATIONS ORDERED IN ED: ?Medications  ?0.9 %  sodium chloride infusion (has no administration in time range)  ? ? ? ?IMPRESSION / MDM / ASSESSMENT AND PLAN / ED COURSE  ?I reviewed the triage vital signs and the nursing notes. ?Patient with bloody diarrhea of new onset.  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  Recent C. difficile and stool panel were negative. ?We will plan on getting the patient admitted as per Dr. Elroy Channel wishes. ? ?The patient is on the cardiac monitor to evaluate for evidence of arrhythmia  and/or significant heart rate changes.  None have been seen ?----------------------------------------- ?10:34 AM on 01/03/2022 ?----------------------------------------- ?Patient is hemodynamically stable at this time.  Hemoglobin and hematocrit are good.  I discussed patient with Dr. Marius Ditch and Dr. Janese Banks.  Dr. Marius Ditch wants to do colonoscopy outpatient.  She will do this quickly.  Dr. Janese Banks is okay with this.  We will let the patient go. ?  ? ? ?FINAL CLINICAL IMPRESSION(S) / ED DIAGNOSES  ? ?Final diagnoses:  ?Rectal bleeding  ? ? ? ?Rx / DC Orders  ? ?ED Discharge Orders   ? ? None  ? ?  ? ? ? ?Note:  This document was prepared using Dragon voice recognition software and may include unintentional dictation errors. ?  ?Nena Polio, MD ?01/03/22 1035 ? ?

## 2022-01-03 NOTE — ED Notes (Signed)
Urine sample sent to lab at this time ?

## 2022-01-03 NOTE — Discharge Instructions (Signed)
Please return for any lightheadedness or heavier bleeding.  Otherwise Dr. Marius Ditch, the gastroenterologist, is going to call you to set up a colonoscopy in the next couple days.  They should call you on Monday.  If they do not call I have provided her number for you to call them.  Dr. Janese Banks is worried that you may be having an autoimmune colitis from the [facility].  It will be important for you to get the very soon.  Dr. Marius Ditch knows this.  She and Dr. Janese Banks have been talking to each other just now. ?

## 2022-01-03 NOTE — ED Notes (Signed)
Pt presents to the ED for rectal bleeding, states that the blood is bright red, denies any rectal pain but does endorse abd pain. Denies NVD. States that she is on chemotherapy for lung cancer and last treatment was Friday. Pt is A&Ox4 and NAD ? ?Pt ambulatory to the bathroom at this time ?

## 2022-01-03 NOTE — ED Notes (Signed)
Dr. Cinda Quest, MD at bedside ?

## 2022-01-03 NOTE — ED Notes (Signed)
Dr. Cinda Quest, MD at bedside. Per Dr. Cinda Quest hold on discharging patient at this time. Pt requesting provider to look at her stool.  ?

## 2022-01-03 NOTE — ED Triage Notes (Addendum)
Pt comes with c/o rectal bleeding that started yesterday. Pt states has been having issues with BM for over month now. Pt states it has been being like loose and mucus. Pt states this is first time she has had blood in stool. Pt states current lung cancer and last treatment on Friday. ? ?Pt states some belly pain and one episode of vomiting. ? ?Pt denies any blood thinners. ?

## 2022-01-04 ENCOUNTER — Telehealth: Payer: Self-pay

## 2022-01-04 ENCOUNTER — Other Ambulatory Visit: Payer: Self-pay

## 2022-01-04 DIAGNOSIS — K529 Noninfective gastroenteritis and colitis, unspecified: Secondary | ICD-10-CM

## 2022-01-04 DIAGNOSIS — C7800 Secondary malignant neoplasm of unspecified lung: Secondary | ICD-10-CM

## 2022-01-04 MED ORDER — NA SULFATE-K SULFATE-MG SULF 17.5-3.13-1.6 GM/177ML PO SOLN: 354.0000 mL | Freq: Once | ORAL | 0 refills | Status: AC

## 2022-01-04 NOTE — Telephone Encounter (Signed)
-----   Message from Lin Landsman, MD sent at 01/03/2022 10:17 AM EDT ----- ?Regarding: Schedule colonoscopy ?Caryl Pina ? 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 ?Thanks ?RV ? ?

## 2022-01-04 NOTE — Telephone Encounter (Signed)
Called patient and schedule patient for Friday for colonoscopy. Patient asked if there was anything sooner and advised patient no that is the only thing she has this week. Patient asked if anyone canceled to call her and I said I would. Went over instructions with patient and patient said she did not know if she could do the prep. Informed patient that she had to do the prep to get cleaned out. Patient verbalized understanding of instructions and sent them to mychart. Sent prep to the pharmacy  ?

## 2022-01-06 ENCOUNTER — Ambulatory Visit: Payer: 59 | Admitting: Anesthesiology

## 2022-01-06 ENCOUNTER — Encounter
Admission: RE | Disposition: A | Discharge status: Home / Self Care | Payer: Self-pay | Source: Home / Self Care | Attending: Gastroenterology

## 2022-01-06 ENCOUNTER — Encounter: Payer: Self-pay | Admitting: Gastroenterology

## 2022-01-06 ENCOUNTER — Ambulatory Visit
Admission: RE | Admit: 2022-01-06 | Discharge: 2022-01-06 | Disposition: A | Discharge status: Home / Self Care | Payer: 59 | Attending: Gastroenterology | Admitting: Gastroenterology

## 2022-01-06 DIAGNOSIS — C7800 Secondary malignant neoplasm of unspecified lung: Secondary | ICD-10-CM | POA: Diagnosis not present

## 2022-01-06 DIAGNOSIS — K523 Indeterminate colitis: Secondary | ICD-10-CM

## 2022-01-06 DIAGNOSIS — K5730 Diverticulosis of large intestine without perforation or abscess without bleeding: Secondary | ICD-10-CM | POA: Diagnosis not present

## 2022-01-06 DIAGNOSIS — K529 Noninfective gastroenteritis and colitis, unspecified: Secondary | ICD-10-CM | POA: Insufficient documentation

## 2022-01-06 DIAGNOSIS — R69 Illness, unspecified: Secondary | ICD-10-CM | POA: Diagnosis not present

## 2022-01-06 DIAGNOSIS — K625 Hemorrhage of anus and rectum: Secondary | ICD-10-CM | POA: Insufficient documentation

## 2022-01-06 HISTORY — PX: COLONOSCOPY WITH PROPOFOL: SHX5780

## 2022-01-06 SURGERY — COLONOSCOPY WITH PROPOFOL
Anesthesia: General

## 2022-01-06 MED ORDER — DEXMEDETOMIDINE (PRECEDEX) IN NS 20 MCG/5ML (4 MCG/ML) IV SYRINGE
PREFILLED_SYRINGE | INTRAVENOUS | Status: DC | PRN
Start: 2022-01-06 — End: 2022-01-06
  Administered 2022-01-06: 8 ug via INTRAVENOUS

## 2022-01-06 MED ORDER — LIDOCAINE HCL (CARDIAC) PF 100 MG/5ML IV SOSY
PREFILLED_SYRINGE | INTRAVENOUS | Status: DC | PRN
  Administered 2022-01-06: 40 mg via INTRAVENOUS

## 2022-01-06 MED ORDER — LIDOCAINE HCL (PF) 2 % IJ SOLN
INTRAMUSCULAR | Status: AC
  Filled 2022-01-06: qty 5

## 2022-01-06 MED ORDER — DEXMEDETOMIDINE HCL IN NACL 80 MCG/20ML IV SOLN
INTRAVENOUS | Status: AC
  Filled 2022-01-06: qty 20

## 2022-01-06 MED ORDER — ONDANSETRON HCL 4 MG/2ML IJ SOLN
INTRAMUSCULAR | Status: DC | PRN
  Administered 2022-01-06: 4 mg via INTRAVENOUS

## 2022-01-06 MED ORDER — SODIUM CHLORIDE 0.9 % IV SOLN
INTRAVENOUS | Status: DC
  Administered 2022-01-06: 1000 mL via INTRAVENOUS

## 2022-01-06 MED ORDER — PROPOFOL 10 MG/ML IV BOLUS
INTRAVENOUS | Status: DC | PRN
Start: 2022-01-06 — End: 2022-01-06
  Administered 2022-01-06: 100 mg via INTRAVENOUS

## 2022-01-06 MED ORDER — PROPOFOL 500 MG/50ML IV EMUL
INTRAVENOUS | Status: AC
  Filled 2022-01-06: qty 50

## 2022-01-06 MED ORDER — PROPOFOL 500 MG/50ML IV EMUL
INTRAVENOUS | Status: DC | PRN
  Administered 2022-01-06: 150 ug/kg/min via INTRAVENOUS

## 2022-01-06 NOTE — H&P (Signed)
?Cephas Darby, MD ?[address]  ?[address]  ?[place], [place] [address]  ?Main: [phone]  ?Fax: [phone] ?Pager: [phone] ? ?Primary Care Physician:  Remi Haggard, FNP ?Primary Gastroenterologist:  Dr. Cephas Darby ? ?Pre-Procedure History & Physical: ?HPI:  Gina Sanchez is a 56 y.o. female is here for an colonoscopy. ?  ?Past Medical History:  ?Diagnosis Date  ? Abnormal Pap smear of cervix   ? Anxiety   ? Depression   ? Diverticulosis   ? Essential hypertension   ? Femur fracture, right ([place])   ? GERD (gastroesophageal reflux disease)   ? Lung cancer ([place])   ? 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  ? Palpitations   ? Precordial chest pain   ? Wrist fracture 03/2021  ? left  ? ? ?Past Surgical History:  ?Procedure Laterality Date  ? BREAST BIOPSY Right 2017  ? benign  ? CERVICAL BIOPSY  W/ LOOP ELECTRODE EXCISION    ? COLONOSCOPY WITH PROPOFOL N/A 07/03/2015  ? Procedure: COLONOSCOPY WITH PROPOFOL;  Surgeon: Hulen Luster, MD;  Location: [facility] ENDOSCOPY;  Service: Gastroenterology;  Laterality: N/A;  ? ESOPHAGOGASTRODUODENOSCOPY (EGD) WITH PROPOFOL N/A 07/03/2015  ? Procedure: ESOPHAGOGASTRODUODENOSCOPY (EGD) WITH PROPOFOL;  Surgeon: Hulen Luster, MD;  Location: [facility] ENDOSCOPY;  Service: Gastroenterology;  Laterality: N/A;  ? FRACTURE SURGERY    ? INTRAMEDULLARY (IM) NAIL INTERTROCHANTERIC Right 09/15/2020  ? Procedure: INTRAMEDULLARY (IM) NAIL INTERTROCHANTRIC;  Surgeon: Corky Mull, MD;  Location: [facility] ORS;  Service: Orthopedics;  Laterality: Right;  ? IR IMAGING GUIDED PORT INSERTION  11/28/2020  ? LEFT HEART CATH AND CORONARY ANGIOGRAPHY N/A 03/28/2017  ? Procedure: Left Heart Cath and Coronary Angiography;  Surgeon: Lorretta Harp, MD;  Location: [place] CV LAB;  Service: Cardiovascular;  Laterality: N/A;  ? ORIF WRIST FRACTURE Left 03/31/2021  ? Procedure: OPEN REDUCTION INTERNAL FIXATION (ORIF) LEFT DISTAL RADIUS FRACTURE.;  Surgeon: Corky Mull, MD;  Location: [facility] ORS;   Service: Orthopedics;  Laterality: Left;  ? ? ?Prior to Admission medications   ?Medication Sig Start Date End Date Taking? Authorizing Provider  ?acetaminophen (TYLENOL) 325 MG tablet Take 1-2 tablets (325-650 mg total) by mouth every 6 (six) hours as needed for mild pain (pain score 1-3 or temp > 100.5). 09/17/20  Yes Wieting, Richard, MD  ?ALPRAZolam Duanne Moron) 0.5 MG tablet Take 1 tablet (0.5 mg total) by mouth daily as needed. 11/04/21  Yes Sindy Guadeloupe, MD  ?famotidine (PEPCID) 20 MG tablet Take 20 mg by mouth in the morning.   Yes [provider]  ?folic acid (FOLVITE) 1 MG tablet Take 1 tablet (1 mg total) by mouth daily. 11/04/21  Yes Sindy Guadeloupe, MD  ?hydrocortisone (CORTEF) 10 MG tablet TAKE 1 TABLET BY MOUTH NIGHTLY 12/23/21  Yes Sindy Guadeloupe, MD  ?hydrocortisone (CORTEF) 20 MG tablet TAKE 1 TABLET BY MOUTH EVERY MORNING 12/21/21  Yes Sindy Guadeloupe, MD  ?lidocaine-prilocaine (EMLA) cream Apply to affected area once ?Patient taking differently: Apply 1 application. topically as needed (prior to port being accessed). 10/19/20  Yes Sindy Guadeloupe, MD  ?metoprolol tartrate (LOPRESSOR) 25 MG tablet Take 25 mg by mouth in the morning. 11/11/20  Yes [provider]  ?oxyCODONE (OXY IR/ROXICODONE) 5 MG immediate release tablet TAKE 1 TO 2 TABLETS BY MOUTH EVERY 4 HOURS AS NEEDED FOR MODERATE PAIN 12/11/21  Yes Sindy Guadeloupe, MD  ?Pembrolizumab [facility] IV) Inject into the vein. Every 3 weeks  Yes [provider]  ?PEMEtrexed Disodium (ALIMTA IV) Inject into the vein. Every 3 weeks   Yes [provider]  ?polyethylene glycol (MIRALAX) 17 g packet Take 17 g by mouth daily as needed for moderate constipation. 09/17/20  Yes Wieting, Richard, MD  ?ciprofloxacin (CIPRO) 500 MG tablet SMARTSIG:1 Tablet(s) By Mouth Every 12 Hours ?Patient not taking: Reported on 01/06/2022 12/28/21   [provider]  ?metFORMIN (GLUCOPHAGE) 500 MG tablet Take 500 mg by mouth 2 (two) times  daily. ?Patient not taking: Reported on 01/01/2022 12/14/21   [provider]  ?metroNIDAZOLE (FLAGYL) 500 MG tablet Take 500 mg by mouth every 8 (eight) hours. ?Patient not taking: Reported on 01/06/2022 12/28/21   [provider]  ?nitrofurantoin (MACRODANTIN) 100 MG capsule Take 100 mg by mouth 2 (two) times daily. ?Patient not taking: Reported on 01/06/2022    [provider]  ?ondansetron (ZOFRAN) 4 MG tablet TAKE 1 TABLET BY MOUTH EVERY 8 HOURS AS NEEDED FOR NAUSEA AND VOMITING 01/01/22   Sindy Guadeloupe, MD  ?prochlorperazine (COMPAZINE) 10 MG tablet Take 1 tablet (10 mg total) by mouth every 6 (six) hours as needed (Nausea or vomiting). 01/22/21   Verlon Au, NP  ? ? ?Allergies as of 01/04/2022 - Review Complete 01/03/2022  ?Allergen Reaction Noted  ? Factive [gemifloxacin] Rash 07/03/2015  ? ? ?Family History  ?Problem Relation Age of Onset  ? Diabetes Mother   ?     alive @ 14  ? Goiter Mother   ? Heart disease Father   ?     died of MI @ 31  ? Osteoporosis Maternal Grandmother   ? Colon cancer Maternal Grandfather   ? Breast cancer Neg Hx   ? Ovarian cancer Neg Hx   ? ? ?Social History  ? ?Socioeconomic History  ? Marital status: Married  ?  Spouse name: Gene  ? Number of children: 2  ? Years of education: Not on file  ? Highest education level: Not on file  ?Occupational History  ? Not on file  ?Tobacco Use  ? Smoking status: Former  ?  Packs/day: 1.00  ?  Years: 30.00  ?  Pack years: 30.00  ?  Types: Cigarettes  ?  Quit date: 09/17/2020  ?  Years since quitting: 1.3  ? Smokeless tobacco: Never  ?Vaping Use  ? Vaping Use: Never used  ?Substance and Sexual Activity  ? Alcohol use: Not Currently  ?  Alcohol/week: 1.0 standard drink  ?  Types: 1 Standard drinks or equivalent per week  ?  Comment: beer occ.  ? Drug use: No  ? Sexual activity: Yes  ?  Birth control/protection: Post-menopausal  ?Other Topics Concern  ? Not on file  ?Social History Narrative  ? Lives in [place] with her  husband.  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  ? ?Social Determinants of Health  ? ?Financial Resource Strain: Not on file  ?Food Insecurity: Not on file  ?Transportation Needs: Not on file  ?Physical Activity: Not on file  ?Stress: Not on file  ?Social Connections: Not on file  ?Intimate Partner Violence: Not on file  ? ? ?Review of Systems: ?See HPI, otherwise negative ROS ? ?Physical Exam: ?Pulse (!) 116   Temp (!) 97 ?F (36.1 ?C) (Temporal)   Resp 18   Ht 5' 7"  (1.702 m)   Wt 104.4 kg   LMP 09/15/2018   BMI 36.05 kg/m?  ?  General:   Alert,  pleasant and cooperative in NAD ?Head:  Normocephalic and atraumatic. ?Neck:  Supple; no masses or thyromegaly. ?Lungs:  Clear throughout to auscultation.    ?Heart:  Regular rate and rhythm. ?Abdomen:  Soft, nontender and nondistended. Normal bowel sounds, without guarding, and without rebound.   ?Neurologic:  Alert and  oriented x4;  grossly normal neurologically. ? ?Impression/Plan: ?MAURISHA MONGEAU is here for an colonoscopy to be performed for chronic diarrhea, rectal bleeding ? ?Risks, benefits, limitations, and alternatives regarding  colonoscopy have been reviewed with the patient.  Questions have been answered.  All parties agreeable. ? ? ?Sherri Sear, MD  01/06/2022, 7:43 AM ?

## 2022-01-06 NOTE — Op Note (Addendum)
[facility] ?Gastroenterology ?Patient Name: Gina Sanchez ?Procedure Date: 01/06/2022 7:20 AM ?MRN: [id number] ?Account #: [id number] ?Date of Birth: [birth year] ?Admit Type: Outpatient ?Age: 56 ?Room: [facility] ENDO ROOM 4 ?Gender: Female ?Note Status: Finalized ?Instrument Name: Colonoscope [id number] ?Procedure:             Colonoscopy ?Indications:           Rectal bleeding, Diarrhea (secondary to noninfectious  ?                       colitis), Clinically significant diarrhea of  ?                       unexplained origin ?Providers:             Lin Landsman MD, MD ?Referring MD:          Jordan Likes. Lavena Bullion (Referring MD) ?Medicines:             General Anesthesia ?Complications:         No immediate complications. Estimated blood loss: None. ?Procedure:             Pre-Anesthesia Assessment: ?                       - Prior to the procedure, a History and Physical was  ?                       performed, and patient medications and allergies were  ?                       reviewed. The patient is competent. The risks and  ?                       benefits of the procedure and the sedation options and  ?                       risks were discussed with the patient. All questions  ?                       were answered and informed consent was obtained.  ?                       Patient identification and proposed procedure were  ?                       verified by the physician, the nurse, the  ?                       anesthesiologist, the anesthetist and the technician  ?                       in the pre-procedure area in the procedure room in the  ?                       endoscopy suite. Mental Status Examination: alert and  ?                       oriented. Airway Examination: normal oropharyngeal  ?  airway and neck mobility. Respiratory Examination:  ?                       clear to auscultation. CV Examination: normal.  ?                       Prophylactic Antibiotics: The  patient does not require  ?                       prophylactic antibiotics. Prior Anticoagulants: The  ?                       patient has taken no previous anticoagulant or  ?                       antiplatelet agents. ASA Grade Assessment: III - A  ?                       patient with severe systemic disease. After reviewing  ?                       the risks and benefits, the patient was deemed in  ?                       satisfactory condition to undergo the procedure. The  ?                       anesthesia plan was to use general anesthesia.  ?                       Immediately prior to administration of medications,  ?                       the patient was re-assessed for adequacy to receive  ?                       sedatives. The heart rate, respiratory rate, oxygen  ?                       saturations, blood pressure, adequacy of pulmonary  ?                       ventilation, and response to care were monitored  ?                       throughout the procedure. The physical status of the  ?                       patient was re-assessed after the procedure. ?                       After obtaining informed consent, the colonoscope was  ?                       passed under direct vision. Throughout the procedure,  ?                       the patient's blood pressure, pulse, and oxygen  ?  saturations were monitored continuously. The  ?                       Colonoscope was introduced through the anus and  ?                       advanced to the the terminal ileum, with  ?                       identification of the appendiceal orifice and IC  ?                       valve. The colonoscopy was performed without  ?                       difficulty. The patient tolerated the procedure well.  ?                       The quality of the bowel preparation was good. ?Findings: ?     The perianal and digital rectal examinations were normal. Pertinent  ?     negatives include normal sphincter tone  and no palpable rectal lesions. ?     The terminal ileum appeared normal. Biopsies were taken with a cold  ?     forceps for histology. ?     Segmental mild to moderate inflammation characterized by altered  ?     vascularity, congestion (edema), erosions, erythema, friability and  ?     mucus was found in the rectum, in the recto-sigmoid colon, in the  ?     sigmoid colon, in the ascending colon and in the cecum. ?     The retroflexed view of the distal rectum and anal verge was normal and  ?     showed no anal or rectal abnormalities other than proctitis. ?     Scattered diverticula were found in the entire colon. ?Impression:            - The examined portion of the ileum was normal.  ?                       Biopsied. ?                       - Segmental moderate inflammation was found in the  ?                       rectum, in the recto-sigmoid colon, in the sigmoid  ?                       colon, in the ascending colon and in the cecum  ?                       secondary to colitis. ?                       - The distal rectum and anal verge are normal on  ?                       retroflexion view. ?                       -  Diverticulosis in the entire examined colon. ?Recommendation:        - Discharge patient to home (with escort). ?                       - Resume previous diet today. ?                       - Continue present medications. ?                       - Await pathology results. ?                       - Return to referring physician as previously  ?                       scheduled. ?Procedure Code(s):     --- Professional --- ?                       [phone], Colonoscopy, flexible; with biopsy, single or  ?                       multiple ?Diagnosis Code(s):     --- Professional --- ?                       K52.9, Noninfective gastroenteritis and colitis,  ?                       unspecified ?                       K62.5, Hemorrhage of anus and rectum ?                       R19.7, Diarrhea, unspecified ?                        K57.30, Diverticulosis of large intestine without  ?                       perforation or abscess without bleeding ?CPT copyright 2019 [employer] Association. All rights reserved. ?The codes documented in this report are preliminary and upon coder review may  ?be revised to meet current compliance requirements. ?Dr. Ulyess Mort ?Lin Landsman MD, MD ?01/06/2022 8:15:28 AM ?This report has been signed electronically. ?Number of Addenda: 0 ?Note Initiated On: 01/06/2022 7:20 AM ?Scope Withdrawal Time: 0 hours 15 minutes 25 seconds  ?Total Procedure Duration: 0 hours 19 minutes 32 seconds  ?Estimated Blood Loss:  Estimated blood loss: none. Estimated blood loss: none. ?     [facility] ?

## 2022-01-06 NOTE — Anesthesia Procedure Notes (Signed)
Date/Time: 01/06/2022 7:46 AM ?Performed by: Doreen Salvage, CRNA ?Pre-anesthesia Checklist: Patient identified, Emergency Drugs available, Suction available and Patient being monitored ?Patient Re-evaluated:Patient Re-evaluated prior to induction ?Oxygen Delivery Method: Nasal cannula ?Induction Type: IV induction ?Dental Injury: Teeth and Oropharynx as per pre-operative assessment  ?Comments: Nasal cannula with etCO2 monitoring ? ? ? ? ?

## 2022-01-06 NOTE — Transfer of Care (Signed)
Immediate Anesthesia Transfer of Care Note ? ?Patient: Gina Sanchez ? ?Procedure(s) Performed: Procedure(s): ?COLONOSCOPY WITH PROPOFOL (N/A) ? ?Patient Location: PACU and Endoscopy Unit ? ?Anesthesia Type:General ? ?Level of Consciousness: sedated ? ?Airway & Oxygen Therapy: Patient Spontanous Breathing and Patient connected to nasal cannula oxygen ? ?Post-op Assessment: Report given to RN and Post -op Vital signs reviewed and stable ? ?Post vital signs: Reviewed and stable ? ?Last Vitals:  ?Vitals:  ? 01/06/22 0708 01/06/22 0815  ?BP:  117/88  ?Pulse: (!) 116 94  ?Resp: 18 17  ?Temp: (!) 36.1 ?C   ?SpO2:  99%  ? ? ?Complications: No apparent anesthesia complications ?

## 2022-01-06 NOTE — Anesthesia Postprocedure Evaluation (Signed)
Anesthesia Post Note ? ?Patient: SARAHMARIE LEAVEY ? ?Procedure(s) Performed: COLONOSCOPY WITH PROPOFOL ? ?Patient location during evaluation: Endoscopy ?Anesthesia Type: General ?Level of consciousness: awake and alert ?Pain management: pain level controlled ?Vital Signs Assessment: post-procedure vital signs reviewed and stable ?Respiratory status: spontaneous breathing, nonlabored ventilation, respiratory function stable and patient connected to nasal cannula oxygen ?Cardiovascular status: blood pressure returned to baseline and stable ?Postop Assessment: no apparent nausea or vomiting ?Anesthetic complications: no ?Comments: Patient had small volume emesis intraop with no evidence of aspiration. I told patient about this event, and that if she developed any shortness of breath or chest pain, to seek urgent medical care. Stable for discharge. ? ? ?No notable events documented. ? ? ?Last Vitals:  ?Vitals:  ? 01/06/22 0820 01/06/22 0830  ?BP: (!) 114/92 122/85  ?Pulse: 95 95  ?Resp: (!) 25 11  ?Temp:    ?SpO2: 100% 98%  ?  ?Last Pain:  ?Vitals:  ? 01/06/22 0708  ?TempSrc: Temporal  ?PainSc: 0-No pain  ? ? ?  ?  ?  ?  ?  ?  ? ?Arita Miss ? ? ? ? ?

## 2022-01-06 NOTE — Anesthesia Preprocedure Evaluation (Addendum)
Anesthesia Evaluation  ?Patient identified by MRN, date of birth, ID band ?Patient awake ? ? ? ?Reviewed: ?Allergy & Precautions, NPO status , Patient's Chart, lab work & pertinent test results ? ?History of Anesthesia Complications ?Negative for: history of anesthetic complications ? ?Airway ?Mallampati: II ? ?TM Distance: >3 FB ?Neck ROM: Full ? ? ? Dental ?no notable dental hx. ?(+) Teeth Intact ?  ?Pulmonary ?neg sleep apnea, neg COPD, Patient abstained from smoking.Not current smoker, former smoker,  ?Lung cancer ?  ?Pulmonary exam normal ?breath sounds clear to auscultation ? ? ? ? ? ? Cardiovascular ?Exercise Tolerance: Good ?METShypertension, + angina (-) CAD and (-) Past MI (-) dysrhythmias  ?Rhythm:Regular Rate:Normal ?- Systolic murmurs ?Normal coronaries on cath, normal echo ?  ?Neuro/Psych ?PSYCHIATRIC DISORDERS Anxiety Depression negative neurological ROS ?   ? GI/Hepatic ?GERD  ,(+)  ?  ? (-) substance abuse ? ,   ?Endo/Other  ?neg diabetes ? Renal/GU ?negative Renal ROS  ? ?  ?Musculoskeletal ? ? Abdominal ?  ?Peds ? Hematology ?  ?Anesthesia Other Findings ?Past Medical History: ?No date: Abnormal Pap smear of cervix ?No date: Anxiety ?No date: Depression ?No date: Diverticulosis ?No date: Essential hypertension ?No date: Femur fracture, right ([place]) ?No date: GERD (gastroesophageal reflux disease) ?No date: Lung cancer [facility]) ?    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    Comment:  left ? Reproductive/Obstetrics ? ?  ? ? ? ? ? ? ? ? ? ? ? ? ? ?  ?  ? ? ? ? ? ? ? ?Anesthesia Physical ?Anesthesia Plan ? ?ASA: 3 ? ?Anesthesia Plan: General  ? ?Post-op Pain Management: Minimal or no pain anticipated  ? ?Induction: Intravenous ? ?PONV Risk Score and Plan: 3 and Propofol infusion, TIVA and Ondansetron ? ?Airway Management Planned: Nasal Cannula ? ?Additional Equipment:  None ? ?Intra-op Plan:  ? ?Post-operative Plan:  ? ?Informed Consent: I have reviewed the patients History and Physical, chart, labs and discussed the procedure including the risks, benefits and alternatives for the proposed anesthesia with the patient or authorized representative who has indicated his/her understanding and acceptance.  ? ? ? ?Dental advisory given ? ?Plan Discussed with: CRNA and Surgeon ? ?Anesthesia Plan Comments: (Discussed risks of anesthesia with patient, including possibility of difficulty with spontaneous ventilation under anesthesia necessitating airway intervention, PONV, and rare risks such as cardiac or respiratory or neurological events, and allergic reactions. Discussed the role of CRNA in patient's perioperative care. Patient understands.)  ? ? ? ? ? ? ?Anesthesia Quick Evaluation ? ?

## 2022-01-07 ENCOUNTER — Encounter: Payer: Self-pay | Admitting: Gastroenterology

## 2022-01-07 ENCOUNTER — Other Ambulatory Visit: Payer: Self-pay | Admitting: Gastroenterology

## 2022-01-07 DIAGNOSIS — K529 Noninfective gastroenteritis and colitis, unspecified: Secondary | ICD-10-CM

## 2022-01-07 MED ORDER — PREDNISONE 10 MG PO TABS: ORAL_TABLET | ORAL | 0 refills | Status: DC

## 2022-01-07 NOTE — Telephone Encounter (Signed)
Rohini- Can you please help me out here? Please see patient message

## 2022-01-07 NOTE — Progress Notes (Signed)
Called patient to discuss the preliminary pathology results from colonoscopy.  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  CMV stain is pending. ? ?Since patient is symptomatic with diarrhea and urgency, will start her on prednisone 60 mg daily for 7 days followed by 50 mg daily for 7 days followed by 40 mg daily for 7 days, taper down by 10 mg weekly ? 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  Patient will not be taking hydrocortisone while she is on high doses of prednisone until we start the taper. ? ?If patient's colitis is not responding to steroids, next option would be infliximab or vedolizumab ? ?Will follow-up on CMV stain ? ? ?Cephas Darby, MD ?[facility], [place] ?[address]  ?[address]  ?[place], [place] [address]  ?Main: [phone]  ?Fax: [phone] ?Pager: [phone] ? ?

## 2022-01-10 ENCOUNTER — Encounter: Payer: Self-pay | Admitting: *Deleted

## 2022-01-13 ENCOUNTER — Other Ambulatory Visit: Payer: Self-pay | Admitting: Oncology

## 2022-01-13 ENCOUNTER — Telehealth: Payer: Self-pay

## 2022-01-13 ENCOUNTER — Telehealth: Payer: Self-pay | Admitting: *Deleted

## 2022-01-13 MED ORDER — VANCOMYCIN HCL 125 MG PO CAPS: 125.0000 mg | ORAL_CAPSULE | Freq: Four times a day (QID) | ORAL | 0 refills | Status: DC

## 2022-01-13 NOTE — Telephone Encounter (Signed)
[facility] is calling because the quantity for the prednisone is not right. She states she called on 01/08/2022 but no one has called her back. I said I was sorry about that. She states she sent the patient home with 100 tablets. Counted how many tablets they needed and it was 147. She states she added the 47 tablets to the prescription.  ? ?

## 2022-01-13 NOTE — Telephone Encounter (Signed)
Called the patient because we were receiving labs that pt had c diff, but the results we had done did not have all the labs of the gi panel. Once dr Janese Banks got the info then she wants pt to stop the steroids, stop the imodium, and stop the cipro and flagyl that PCP sent for her. Dr. Janese Banks has sent in vancomycin 125 mg tablets and take tem 4 times a day. In am, lunch, evening , then night time. Also spoke to her that if she has 2 bathrooms in the house then she use 1 bathroom and let husband to use the other one. She should flush toilet  twice and then wipe the toilet with Clorox each time.I told her that it can spread if anyone comes into contact with her stool.  She understands the info above. Also if she starts having more pain, or has a fever to call us. She will she says ?

## 2022-01-15 LAB — SURGICAL PATHOLOGY

## 2022-01-18 ENCOUNTER — Encounter: Payer: Self-pay | Admitting: *Deleted

## 2022-01-18 ENCOUNTER — Telehealth: Payer: Self-pay | Admitting: *Deleted

## 2022-01-18 NOTE — Telephone Encounter (Signed)
Patient called reporting that she is no better and is asking what to do. She has C Diff. Please advise ?

## 2022-01-18 NOTE — Telephone Encounter (Signed)
Looks like Dr. Marius Ditch doesn't get back for 2 days. Please someone see her

## 2022-01-18 NOTE — Telephone Encounter (Signed)
Seeing me tomorrow at 2.15 pm

## 2022-01-19 ENCOUNTER — Ambulatory Visit (INDEPENDENT_AMBULATORY_CARE_PROVIDER_SITE_OTHER): Payer: 59 | Admitting: Gastroenterology

## 2022-01-19 ENCOUNTER — Encounter: Payer: Self-pay | Admitting: Gastroenterology

## 2022-01-19 ENCOUNTER — Encounter: Payer: Self-pay | Admitting: Oncology

## 2022-01-19 VITALS — BP 122/77 | HR 83 | Temp 97.6°F | Ht 66.0 in | Wt 214.0 lb

## 2022-01-19 DIAGNOSIS — K529 Noninfective gastroenteritis and colitis, unspecified: Secondary | ICD-10-CM | POA: Diagnosis not present

## 2022-01-19 NOTE — Progress Notes (Signed)
?  ?Jonathon Bellows MD, MRCP(U.K) ?[address]  ?[address]  ?[place], [place] [address]  ?Main: [phone]  ?Fax: [phone] ? ? ?Primary Care Physician: Remi Haggard, FNP ? ?Primary Gastroenterologist:  Dr. Jonathon Bellows  ? ?Chief Complaint  ?Patient presents with  ? Follow-up  ?  colitis  ? ? ?HPI: Gina Sanchez is a 56 y.o. female is a patient who follows with Dr. Marius Ditch whom I am seeing for today as she is away and the patient required an urgent visit.  The patient has a history of metastatic lung cancer with bone, lymph, adrenal metastasis.  Follows with Dr. Janese Banks in oncology.  Commenced on Keytruda on 01/01/2022.  Presented to the emergency room on 01/03/2022 with mucus diarrheal stools going on for a month.  On November 12, 2021 CT chest abdomen pelvis with contrast showed. No gross abnormalities of the GI tract except sigmoid diverticulosis.  On 12/24/2018 3 GI PCR panel and C. difficile quick screen were negative.  Due to some issues with the lab interface the results in epic does not show a positive C. difficile toxin but the faxed result from the labs which arrived much later showed a positive toxin of C. difficile.  Dr. Marius Ditch did a colonoscopy on 4/26/2023To evaluate further that showed mild to moderate inflammation with altered vascular tree congestion erythema erosions friability and mucus in the rectum sigmoid colon ascending colon and cecum suggesting of pancolitis.  Biopsies were taken.  And showed normal features in the small bowel but moderate acute colitis in the ascending colon, severe colitis in the transverse colon with no abnormalities in the descending colon but moderate to severe colitis in the rectum and sigmoid colon. ? ?Initially was commenced on steroids but after the C. difficile toxin results came back positive the steroids were stopped and commenced on vancomycin 125 mg 4 times daily.  She is here today to discuss no improvement in her symptoms.  She had called the oncology center  yesterday who discussed with me over the phone and I suggested to increase the dose of vancomycin to 500 mg 4 times daily in the interim. ? ?She has not increased the dose to 500 mg 4 times a day.  Discussing with her today she has actually had an improvement in symptoms she is sleeping through the night with no bowel movements.  There is minimal to no bleeding noted at this point of time.  Her appetite has improved and she has minimal abdominal pain ?   ? ?Current Outpatient Medications  ?Medication Sig Dispense Refill  ? acetaminophen (TYLENOL) 325 MG tablet Take 1-2 tablets (325-650 mg total) by mouth every 6 (six) hours as needed for mild pain (pain score 1-3 or temp > 100.5).    ? ALPRAZolam (XANAX) 0.5 MG tablet Take 1 tablet (0.5 mg total) by mouth daily as needed. 30 tablet 0  ? famotidine (PEPCID) 20 MG tablet Take 20 mg by mouth in the morning.    ? folic acid (FOLVITE) 1 MG tablet Take 1 tablet (1 mg total) by mouth daily. 90 tablet 1  ? lidocaine-prilocaine (EMLA) cream Apply to affected area once (Patient taking differently: Apply 1 application. topically as needed (prior to port being accessed).) 30 g 3  ? metoprolol tartrate (LOPRESSOR) 25 MG tablet Take 25 mg by mouth in the morning.    ? ondansetron (ZOFRAN) 4 MG tablet TAKE 1 TABLET BY MOUTH EVERY 8 HOURS AS NEEDED FOR NAUSEA AND VOMITING 20  tablet 0  ? oxyCODONE (OXY IR/ROXICODONE) 5 MG immediate release tablet TAKE 1 TO 2 TABLETS BY MOUTH EVERY 4 HOURS AS NEEDED FOR MODERATE PAIN 60 tablet 0  ? Pembrolizumab (KEYTRUDA IV) Inject into the vein. Every 3 weeks    ? PEMEtrexed Disodium (ALIMTA IV) Inject into the vein. Every 3 weeks    ? polyethylene glycol (MIRALAX) 17 g packet Take 17 g by mouth daily as needed for moderate constipation. 30 each 0  ? predniSONE (DELTASONE) 10 MG tablet Take 6 tablets (60 mg total) by mouth daily with breakfast for 7 days, THEN 5 tablets (50 mg total) daily with breakfast for 7 days, THEN 4 tablets (40 mg total)  daily with breakfast for 7 days, THEN 3 tablets (30 mg total) daily with breakfast for 7 days, THEN 2 tablets (20 mg total) daily with breakfast for 7 days, THEN 1 tablet (10 mg total) daily with breakfast for 7 days. 100 tablet 0  ? prochlorperazine (COMPAZINE) 10 MG tablet Take 1 tablet (10 mg total) by mouth every 6 (six) hours as needed (Nausea or vomiting). 30 tablet 1  ? vancomycin (VANCOCIN) 125 MG capsule Take 1 capsule (125 mg total) by mouth 4 (four) times daily. 40 capsule 0  ? hydrocortisone (CORTEF) 10 MG tablet TAKE 1 TABLET BY MOUTH NIGHTLY (Patient not taking: Reported on 01/19/2022) 30 tablet 1  ? hydrocortisone (CORTEF) 20 MG tablet TAKE 1 TABLET BY MOUTH EVERY MORNING (Patient not taking: Reported on 01/19/2022) 30 tablet 1  ? ?No current facility-administered medications for this visit.  ? ?Facility-Administered Medications Ordered in Other Visits  ?Medication Dose Route Frequency Provider Last Rate Last Admin  ? sodium chloride flush (NS) 0.9 % injection 10 mL  10 mL Intravenous PRN Sindy Guadeloupe, MD   10 mL at 03/09/21 0906  ? Zoledronic Acid (ZOMETA) IVPB 4 mg  4 mg Intravenous Q28 days Sindy Guadeloupe, MD   Stopped at 11/21/20 1100  ? ? ?Allergies as of 01/19/2022 - Review Complete 01/19/2022  ?Allergen Reaction Noted  ? Factive [gemifloxacin] Rash 07/03/2015  ? ? ?ROS: ? ?General: Negative for anorexia, weight loss, fever, chills, fatigue, weakness. ?ENT: Negative for hoarseness, difficulty swallowing , nasal congestion. ?CV: Negative for chest pain, angina, palpitations, dyspnea on exertion, peripheral edema.  ?Respiratory: Negative for dyspnea at rest, dyspnea on exertion, cough, sputum, wheezing.  ?GI: See history of present illness. ?GU:  Negative for dysuria, hematuria, urinary incontinence, urinary frequency, nocturnal urination.  ?Endo: Negative for unusual weight change.  ?  ?Physical Examination: ? ? BP 122/77 (BP Location: Left Arm, Patient Position: Sitting, Cuff Size: Normal)    Pulse 83   Temp 97.6 ?F (36.4 ?C) (Oral)   Ht 5' 6"  (1.676 m)   Wt 214 lb (97.1 kg)   LMP 09/15/2018   BMI 34.54 kg/m?  ? ?General: Well-nourished, well-developed in no acute distress.  ?Eyes: No icterus. Conjunctivae pink. ?Mouth: Oropharyngeal mucosa moist and pink , no lesions erythema or exudate. ?Lungs: Clear to auscultation bilaterally. Non-labored. ?Heart: Regular rate and rhythm, no murmurs rubs or gallops.  ?Abdomen: Bowel sounds are normal, nontender, nondistended, no hepatosplenomegaly or masses, no abdominal bruits or hernia , no rebound or guarding.   ?Extremities: No lower extremity edema. No clubbing or deformities. ?Neuro: Alert and oriented x 3.  Grossly intact. ?Skin: Warm and dry, no jaundice.   ?Psych: Alert and cooperative, normal mood and affect. ? ? ?Imaging Studies: ?No results found. ? ?Assessment and  Plan:  ? ?Gina Sanchez is a 56 y.o. y/o female with a history of metastatic lung cancer commenced on Keytruda subsequently developed bloody diarrhea, colonoscopy showed pancolitis with moderate to severe in appearance.  Initially stool testing due to miscommunication from the lab did not show any evidence of C. difficile but a subsequent fax copy showed C. difficile toxin being positive.  Prior to the positive test the patient was started on steroids which was subsequently stopped after receiving the positive C. difficile testing.  At that point was commenced on vancomycin 125 mg 4 times daily.  Due to increasing diarrhea and no improvement in symptoms the vancomycin was increased to 500 mg 4 times daily yesterday although she has not presently increased the dose..  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  Discussed the case with Dr. 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 ?Plan ?1.  Continue vancomycin 125 mg 4 times daily as she has not really increase the dose.  She  has responded at this point of time she is having fewer bowel movements, sleeping well through the night with no nocturnal bowel movements, no blood in the stool. ?2.  Check labs per MD Ouida Sills algorithm for [employer]

## 2022-01-20 ENCOUNTER — Encounter: Payer: Self-pay | Admitting: Oncology

## 2022-01-20 DIAGNOSIS — K529 Noninfective gastroenteritis and colitis, unspecified: Secondary | ICD-10-CM | POA: Diagnosis not present

## 2022-01-21 ENCOUNTER — Encounter: Payer: Self-pay | Admitting: Oncology

## 2022-01-21 ENCOUNTER — Ambulatory Visit: Payer: 59 | Admitting: Gastroenterology

## 2022-01-21 ENCOUNTER — Encounter: Payer: Self-pay | Admitting: Gastroenterology

## 2022-01-21 ENCOUNTER — Other Ambulatory Visit: Payer: Self-pay

## 2022-01-21 VITALS — BP 109/75 | HR 83 | Temp 98.3°F | Ht 66.0 in | Wt 215.1 lb

## 2022-01-21 DIAGNOSIS — A0472 Enterocolitis due to Clostridium difficile, not specified as recurrent: Secondary | ICD-10-CM

## 2022-01-21 LAB — CELIAC DISEASE PANEL
Endomysial IgA: NEGATIVE
Immunoglobulin A, (IgA) QN, Serum: 289 mg/dL (ref 87–352)
t-Transglutaminase (tTG) IgA: <2 U/mL (ref 0–3)

## 2022-01-21 MED ORDER — PRAMOXINE HCL (PERIANAL) 1 % EX FOAM: Freq: Four times a day (QID) | CUTANEOUS | 1 refills | Status: AC | PRN

## 2022-01-21 MED ORDER — VANCOMYCIN HCL 125 MG PO CAPS
ORAL_CAPSULE | ORAL | 0 refills | Status: DC
Start: 2022-01-21 — End: 2022-02-11

## 2022-01-21 NOTE — Progress Notes (Signed)
?  ?Cephas Darby, MD ?[address]  ?[address]  ?[place],  [address]  ?Main: [phone]  ?Fax: [phone] ? ? ? ?Gastroenterology Consultation ? ?Referring Provider:     Remi Haggard, FNP ?Primary Care Physician:  Remi Haggard, FNP ?Primary Gastroenterologist:  Dr. Cephas Darby ?Reason for Consultation:     Colitis ?      ? HPI:   ?Gina Sanchez is a 56 y.o. female referred by Dr. 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 ?The patient has a history of metastatic lung cancer with bone, lymph, adrenal metastasis.  Follows with Dr. Janese Banks in oncology.  Commenced on Keytruda on 01/01/2022.  Presented to the emergency room on 01/03/2022 with mucus diarrheal stools going on for a month.  On November 12, 2021 CT chest abdomen pelvis with contrast showed no gross abnormalities of the GI tract except sigmoid diverticulosis.  On 12/23/2021 GI PCR panel and C. difficile quick screen were negative.  Due to some issues with the lab interface the results in epic does not show a positive C. difficile toxin but the faxed result from the labs which arrived much later showed a positive toxin of C. difficile.  Dr. Marius Ditch did a colonoscopy on 01/06/2022 to evaluate further that showed mild to moderate inflammation with altered vascular tree congestion erythema erosions friability and mucus in the rectum sigmoid colon ascending colon and cecum suggesting of pancolitis.  Biopsies were taken.  And showed normal features in the small bowel but moderate acute colitis in the ascending colon, severe colitis in the transverse colon with no abnormalities in the descending colon but moderate to severe colitis in the rectum and sigmoid colon with evidence of chronicity in the entire colon biopsies ?  ?Initially was commenced on steroids but after the C. difficile toxin results came back positive the steroids were stopped and commenced on vancomycin 125 mg 4 times daily.   Patient was seen by Dr. Vicente Males on 01/19/2022 and she reported some improvement in her diarrheal symptoms.  Her CRP was found to be elevated at 2 mg/dL.  Today, patient is accompanied by her husband and she reports continued improvement in her symptoms.  She is no longer experiencing rectal bleeding and she reports feeling 50% better.  She used to have bowel movement every hour, down to 5-6 times daily.  Her main concern is rectal pressure and urgency.  She reports her appetite is improving, denies any abdominal bloating.  She denies any fever, chills, nausea.  Her husband also states that she looks a lot better.  Labs from 5/9 revealed normal CBC, CMP, celiac disease panel. ?Patient is not on prednisone or hydrocortisone ? ?NSAIDs: None ? ?Antiplts/Anticoagulants/Anti thrombotics: None ? ?GI Procedures:  ?Colonoscopy 01/06/2022 ?- Segmental moderate inflammation was found in the rectum, in the recto-sigmoid colon, in the sigmoid colon, in ?the ascending colon and in the cecum secondary to colitis. ?- The distal rectum and anal verge are normal on retroflexion view. ?- Diverticulosis in the entire examined colon. ? ?DIAGNOSIS:  ?A.  TERMINAL ILEUM; BIOPSIES:  ?- SMALL BOWEL MUCOSA WITH NORMAL VILLOUS ARCHITECTURE AND SCATTERED MILD  ?LYMPHOID HYPERPLASIA.  ?- NO EVIDENCE OF SIGNIFICANT ATYPIA.  ? ?B.  COLON, ASCENDING; BIOPSIES;  ?- MODERATE ACUTE COLITIS (SEE COMMENT)  ? ?C.  COLON, TRANSVERSE, BIOPSIES;  ?- FOCAL SEVERE ACUTE COLITIS (SEE COMMENT).  ? ?D.  COLON, DESCENDING; BIOPSIES:  ?COLONIC MUCOSA WITH  NO SPECIFIC HISTOLOGIC ABNORMALITY.  ? ?E.  COLON, SIGMOID; BIOPSIES:  ?- COLONIC MUCOSA WITH MODERATE TO SEVERE ACUTE COLITIS (SEE COMMENT).  ? ?F.  RECTUM; BIOPSIES:  ?- DIFFUSE SEVERE ACUTE COLITIS (SEE COMMENT).  ?Comment: Biopsies from the ascending transverse sigmoid and rectum show  ?diffuse increased acute and chronic inflammation with numerous areas of  ?cryptitis and crypt abscess.  There is miniaturization  of crypts.  ?Significant complex architectural branching is not seen.  There is  ?marked reactive nuclear change but no definite dysplasia. There are  ?occasional cells with prominent eosinophilic nucleoli. The pattern  ?present is nonspecific but has features that have been associated with  ?Keytruda induced colitis (or other medication effects).  An acute  ?ischemic colitis would also be in the differential. ? ?Past Medical History:  ?Diagnosis Date  ? Abnormal Pap smear of cervix   ? Anxiety   ? Depression   ? Diverticulosis   ? Essential hypertension   ? Femur fracture, right ([place])   ? GERD (gastroesophageal reflux disease)   ? Lung cancer ([place])   ? 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  ? Palpitations   ? Precordial chest pain   ? Wrist fracture 03/2021  ? left  ? ? ?Past Surgical History:  ?Procedure Laterality Date  ? BREAST BIOPSY Right 2017  ? benign  ? CERVICAL BIOPSY  W/ LOOP ELECTRODE EXCISION    ? COLONOSCOPY WITH PROPOFOL N/A 07/03/2015  ? Procedure: COLONOSCOPY WITH PROPOFOL;  Surgeon: Hulen Luster, MD;  Location: [facility] ENDOSCOPY;  Service: Gastroenterology;  Laterality: N/A;  ? COLONOSCOPY WITH PROPOFOL N/A 01/06/2022  ? Procedure: COLONOSCOPY WITH PROPOFOL;  Surgeon: Lin Landsman, MD;  Location: [facility] ENDOSCOPY;  Service: Gastroenterology;  Laterality: N/A;  ? ESOPHAGOGASTRODUODENOSCOPY (EGD) WITH PROPOFOL N/A 07/03/2015  ? Procedure: ESOPHAGOGASTRODUODENOSCOPY (EGD) WITH PROPOFOL;  Surgeon: Hulen Luster, MD;  Location: [facility] ENDOSCOPY;  Service: Gastroenterology;  Laterality: N/A;  ? FRACTURE SURGERY    ? INTRAMEDULLARY (IM) NAIL INTERTROCHANTERIC Right 09/15/2020  ? Procedure: INTRAMEDULLARY (IM) NAIL INTERTROCHANTRIC;  Surgeon: Corky Mull, MD;  Location: [facility] ORS;  Service: Orthopedics;  Laterality: Right;  ? IR IMAGING GUIDED PORT INSERTION  11/28/2020  ? LEFT HEART CATH AND CORONARY ANGIOGRAPHY N/A 03/28/2017  ? Procedure: Left Heart Cath and Coronary Angiography;  Surgeon: Lorretta Harp,  MD;  Location: [place] CV LAB;  Service: Cardiovascular;  Laterality: N/A;  ? ORIF WRIST FRACTURE Left 03/31/2021  ? Procedure: OPEN REDUCTION INTERNAL FIXATION (ORIF) LEFT DISTAL RADIUS FRACTURE.;  Surgeon: Corky Mull, MD;  Location: [facility] ORS;  Service: Orthopedics;  Laterality: Left;  ? ?Current Outpatient Medications:  ?  acetaminophen (TYLENOL) 325 MG tablet, Take 1-2 tablets (325-650 mg total) by mouth every 6 (six) hours as needed for mild pain (pain score 1-3 or temp > 100.5)., Disp: , Rfl:  ?  ALPRAZolam (XANAX) 0.5 MG tablet, Take 1 tablet (0.5 mg total) by mouth daily as needed., Disp: 30 tablet, Rfl: 0 ?  diphenoxylate-atropine (LOMOTIL) 2.5-0.025 MG tablet, Take 1-2 tablets by mouth 4 (four) times daily as needed., Disp: , Rfl:  ?  famotidine (PEPCID) 20 MG tablet, Take 20 mg by mouth in the morning., Disp: , Rfl:  ?  folic acid (FOLVITE) 1 MG tablet, Take 1 tablet (1 mg total) by mouth daily., Disp: 90 tablet, Rfl: 1 ?  lidocaine-prilocaine (EMLA) cream, Apply to affected area once (Patient taking differently: Apply 1 application. topically as needed (prior to port being  accessed).), Disp: 30 g, Rfl: 3 ?  metoprolol tartrate (LOPRESSOR) 25 MG tablet, Take 25 mg by mouth in the morning., Disp: , Rfl:  ?  ondansetron (ZOFRAN) 4 MG tablet, TAKE 1 TABLET BY MOUTH EVERY 8 HOURS AS NEEDED FOR NAUSEA AND VOMITING, Disp: 20 tablet, Rfl: 0 ?  oxyCODONE (OXY IR/ROXICODONE) 5 MG immediate release tablet, TAKE 1 TO 2 TABLETS BY MOUTH EVERY 4 HOURS AS NEEDED FOR MODERATE PAIN, Disp: 60 tablet, Rfl: 0 ?  Pembrolizumab (KEYTRUDA IV), Inject into the vein. Every 3 weeks, Disp: , Rfl:  ?  PEMEtrexed Disodium (ALIMTA IV), Inject into the vein. Every 3 weeks, Disp: , Rfl:  ?  pramoxine (PROCTOFOAM) 1 % foam, Place rectally every 6 (six) hours as needed for up to 14 days for anal itching, anal irritation or hemorrhoids., Disp: 15 g, Rfl: 1 ?  prochlorperazine (COMPAZINE) 10 MG tablet, Take 1 tablet (10 mg total) by  mouth every 6 (six) hours as needed (Nausea or vomiting)., Disp: 30 tablet, Rfl: 1 ?  vancomycin (VANCOCIN) 125 MG capsule, Take 1 capsule (125 mg total) by mouth 4 (four) times daily for 4 days, THEN

## 2022-01-22 ENCOUNTER — Other Ambulatory Visit: Payer: Self-pay

## 2022-01-22 ENCOUNTER — Encounter: Payer: Self-pay | Admitting: Oncology

## 2022-01-22 ENCOUNTER — Inpatient Hospital Stay: Payer: 59 | Attending: Oncology

## 2022-01-22 ENCOUNTER — Inpatient Hospital Stay (HOSPITAL_BASED_OUTPATIENT_CLINIC_OR_DEPARTMENT_OTHER): Payer: 59 | Admitting: Oncology

## 2022-01-22 ENCOUNTER — Ambulatory Visit: Payer: 59

## 2022-01-22 VITALS — BP 98/74 | HR 72 | Temp 98.2°F | Resp 16 | Ht 66.0 in | Wt 214.7 lb

## 2022-01-22 DIAGNOSIS — C3491 Malignant neoplasm of unspecified part of right bronchus or lung: Secondary | ICD-10-CM

## 2022-01-22 DIAGNOSIS — K529 Noninfective gastroenteritis and colitis, unspecified: Secondary | ICD-10-CM | POA: Diagnosis not present

## 2022-01-22 DIAGNOSIS — R197 Diarrhea, unspecified: Secondary | ICD-10-CM | POA: Insufficient documentation

## 2022-01-22 DIAGNOSIS — I10 Essential (primary) hypertension: Secondary | ICD-10-CM | POA: Diagnosis not present

## 2022-01-22 DIAGNOSIS — C7970 Secondary malignant neoplasm of unspecified adrenal gland: Secondary | ICD-10-CM | POA: Diagnosis not present

## 2022-01-22 DIAGNOSIS — C779 Secondary and unspecified malignant neoplasm of lymph node, unspecified: Secondary | ICD-10-CM | POA: Diagnosis not present

## 2022-01-22 DIAGNOSIS — Z7952 Long term (current) use of systemic steroids: Secondary | ICD-10-CM | POA: Insufficient documentation

## 2022-01-22 DIAGNOSIS — Z87891 Personal history of nicotine dependence: Secondary | ICD-10-CM | POA: Diagnosis not present

## 2022-01-22 DIAGNOSIS — K219 Gastro-esophageal reflux disease without esophagitis: Secondary | ICD-10-CM | POA: Insufficient documentation

## 2022-01-22 DIAGNOSIS — E785 Hyperlipidemia, unspecified: Secondary | ICD-10-CM | POA: Diagnosis not present

## 2022-01-22 DIAGNOSIS — E2740 Unspecified adrenocortical insufficiency: Secondary | ICD-10-CM | POA: Insufficient documentation

## 2022-01-22 DIAGNOSIS — C7951 Secondary malignant neoplasm of bone: Secondary | ICD-10-CM | POA: Insufficient documentation

## 2022-01-22 LAB — CBC WITH DIFFERENTIAL/PLATELET
Abs Immature Granulocytes: 0.03 10*3/uL (ref 0.00–0.07)
Basophils Absolute: 0.1 10*3/uL (ref 0.0–0.1)
Basophils Relative: 1 %
Eosinophils Absolute: 0.3 10*3/uL (ref 0.0–0.5)
Eosinophils Relative: 3 %
HCT: 38.6 % (ref 36.0–46.0)
Hemoglobin: 12.8 g/dL (ref 12.0–15.0)
Immature Granulocytes: 0 %
Lymphocytes Relative: 28 %
Lymphs Abs: 2.3 10*3/uL (ref 0.7–4.0)
MCH: 29.3 pg (ref 26.0–34.0)
MCHC: 33.2 g/dL (ref 30.0–36.0)
MCV: 88.3 fL (ref 80.0–100.0)
Monocytes Absolute: 0.9 10*3/uL (ref 0.1–1.0)
Monocytes Relative: 11 %
Neutro Abs: 4.6 10*3/uL (ref 1.7–7.7)
Neutrophils Relative %: 57 %
Platelets: 329 10*3/uL (ref 150–400)
RBC: 4.37 MIL/uL (ref 3.87–5.11)
RDW: 14.6 % (ref 11.5–15.5)
WBC: 8.2 10*3/uL (ref 4.0–10.5)
nRBC: 0 % (ref 0.0–0.2)

## 2022-01-22 LAB — COMPREHENSIVE METABOLIC PANEL
ALT: 21 U/L (ref 0–44)
AST: 23 U/L (ref 15–41)
Albumin: 3.2 g/dL — ABNORMAL LOW (ref 3.5–5.0)
Alkaline Phosphatase: 63 U/L (ref 38–126)
Anion gap: 10 (ref 5–15)
BUN: 7 mg/dL (ref 6–20)
CO2: 21 mmol/L — ABNORMAL LOW (ref 22–32)
Calcium: 7.9 mg/dL — ABNORMAL LOW (ref 8.9–10.3)
Chloride: 104 mmol/L (ref 98–111)
Creatinine, Ser: 0.96 mg/dL (ref 0.44–1.00)
GFR, Estimated: >60 mL/min (ref >60)
Glucose, Bld: 141 mg/dL — ABNORMAL HIGH (ref 70–99)
Potassium: 3.4 mmol/L — ABNORMAL LOW (ref 3.5–5.1)
Sodium: 135 mmol/L (ref 135–145)
Total Bilirubin: 1 mg/dL (ref 0.3–1.2)
Total Protein: 6.2 g/dL — ABNORMAL LOW (ref 6.5–8.1)

## 2022-01-22 LAB — C-REACTIVE PROTEIN: CRP: 12 mg/L — ABNORMAL HIGH (ref 0–10)

## 2022-01-22 MED ORDER — OXYCODONE HCL 5 MG PO TABS: ORAL_TABLET | ORAL | 0 refills | Status: DC

## 2022-01-22 MED ORDER — ALPRAZOLAM 0.5 MG PO TABS: 0.5000 mg | ORAL_TABLET | Freq: Every day | ORAL | 0 refills | Status: DC | PRN

## 2022-01-22 MED ORDER — HEPARIN SOD (PORK) LOCK FLUSH 100 UNIT/ML IV SOLN
500.0000 [IU] | Freq: Once | INTRAVENOUS | Status: AC
  Administered 2022-01-22: 500 [IU] via INTRAVENOUS
  Filled 2022-01-22: qty 5

## 2022-01-22 MED ORDER — ONDANSETRON HCL 4 MG PO TABS: ORAL_TABLET | ORAL | 0 refills | Status: DC

## 2022-01-22 MED ORDER — PROCHLORPERAZINE MALEATE 10 MG PO TABS: 10.0000 mg | ORAL_TABLET | Freq: Four times a day (QID) | ORAL | 1 refills | Status: DC | PRN

## 2022-01-22 NOTE — Progress Notes (Signed)
? ? ? ?Hematology/Oncology Consult note ?[place]  ?[phone] [phone] ? ?Patient Care Team: ?Remi Haggard, FNP as PCP - General (Family Medicine) ?Telford Nab, RN as Sales executive ?Noreene Filbert, MD as Radiation Oncologist (Radiation Oncology) ?Ottie Glazier, MD as Consulting Physician (Pulmonary Disease)  ? ?Name of the patient: Gina Sanchez  ?[id number]  ?07-Jun-1966  ? ?Date of visit: 01/22/22 ? 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  ? ?Chief complaint/ Reason for visit-routine follow-up of ongoing diarrhea ? ?Heme/Onc history: Patient is a 56 year old female with a past medical history significant for hypertension hyperlipidemia and anxiety who presented with right thigh pain and was found to have an acute right proximal femoral shaft fracture.  She underwent operative fixation on 10/12/2020.  MRI of femur showed heterogeneously enhancing osseous lesion within the area which would be nonspecific versus office neoplasm or metastatic lesion.  CT chest abdomen and pelvis with contrast showed an enlarged pretracheal lymph node 2.2 x 1.5 cm and a right paratracheal lymph node measuring 2.7 x 1.3 cm.  Prevascular node measuring 0.3 x 0.9 cm.  5 x 4 mm right middle lobe nodule.  2.8 x 1.9 cm left adrenal lesion. ?   ?Reamings from the right femur showed metastatic poorly differentiated carcinoma.  Immunohistochemistry showed was positive for pancytokeratin, CK7 and patchy CK20 with patchy dim expression of TTF-1.  Cells negative for Melan-A, CDX2, PAX8, Napsin A, GATA3, p40, CD56, p16 and thyroglobulin.  Findings compatible with metastatic carcinoma but because of decalcification immunohistochemical staining is unreliable.  Patchy dim staining with TTF-1 suspicious for lung primary but not a definitive diagnosis. ?  ?Repeat supraclavicular lymph node biopsy showed metastatic adenocarcinoma.  Tumor  cells positive for CK7 with focal weak staining for TTF-1.  Suggestive of lung origin in the proper clinical context.  Cells were negative for GATA3 PAX8 CDX2 and CK20 and Napsin A.  Foundation 1 liquid biopsy showed  Showed K-ras G12 C, PIK3 CA, KDA P1C171F, KDM 5CE 296 ?  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  She is currently on hydrocortisone twice daily.  Thyroid functions presently are normal ?  ? ?Interval history-patient is currently on vancomycin 125 mg 4 times a day.  She is off steroids presently.  States that she does not have any bloody bowel movements anymore.  She continues to have small episodes of diarrhea and she had 3 this morning but overall feeling better. ? ?ECOG PS- 0 ?Pain scale- 0 ? ? ?Review of systems- Review of Systems  ?Constitutional:  Negative for chills, fever, malaise/fatigue and weight loss.  ?HENT:  Negative for congestion, ear discharge and nosebleeds.   ?Eyes:  Negative for blurred vision.  ?Respiratory:  Negative for cough, hemoptysis, sputum production, shortness of breath and wheezing.   ?Cardiovascular:  Negative for chest pain, palpitations, orthopnea and claudication.  ?Gastrointestinal:  Positive for diarrhea. Negative for abdominal pain, blood in stool, constipation, heartburn, melena, nausea and vomiting.  ?Genitourinary:  Negative for dysuria, flank pain, frequency, hematuria and urgency.  ?Musculoskeletal:  Negative for back pain, joint pain and myalgias.  ?Skin:  Negative for rash.  ?Neurological:  Negative for dizziness, tingling, focal weakness, seizures, weakness and headaches.  ?Endo/Heme/Allergies:  Does not bruise/bleed easily.  ?Psychiatric/Behavioral:  Negative for depression and suicidal ideas. The patient does not have insomnia.    ? ? ? ?Allergies  ?Allergen Reactions  ? Factive [Gemifloxacin] Rash  ? ? ? ?  Past Medical History:  ?Diagnosis Date  ? Abnormal Pap smear of cervix   ? Anxiety   ? Depression   ?  Diverticulosis   ? Essential hypertension   ? Femur fracture, right ([place])   ? GERD (gastroesophageal reflux disease)   ? Lung cancer ([place])   ? 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  ? Palpitations   ? Precordial chest pain   ? Wrist fracture 03/2021  ? left  ? ? ? ?Past Surgical History:  ?Procedure Laterality Date  ? BREAST BIOPSY Right 2017  ? benign  ? CERVICAL BIOPSY  W/ LOOP ELECTRODE EXCISION    ? COLONOSCOPY WITH PROPOFOL N/A 07/03/2015  ? Procedure: COLONOSCOPY WITH PROPOFOL;  Surgeon: Hulen Luster, MD;  Location[facility] ENDOSCOPY;  Service: Gastroenterology;  Laterality: N/A;  ? COLONOSCOPY WITH PROPOFOL N/A 01/06/2022  ? Procedure: COLONOSCOPY WITH PROPOFOL;  Surgeon: Lin Landsman, MD;  Location: [facility] ENDOSCOPY;  Service: Gastroenterology;  Laterality: N/A;  ? ESOPHAGOGASTRODUODENOSCOPY (EGD) WITH PROPOFOL N/A 07/03/2015  ? Procedure: ESOPHAGOGASTRODUODENOSCOPY (EGD) WITH PROPOFOL;  Surgeon: Hulen Luster, MD;  Location: [facility] ENDOSCOPY;  Service: Gastroenterology;  Laterality: N/A;  ? FRACTURE SURGERY    ? INTRAMEDULLARY (IM) NAIL INTERTROCHANTERIC Right 09/15/2020  ? Procedure: INTRAMEDULLARY (IM) NAIL INTERTROCHANTRIC;  Surgeon: Corky Mull, MD;  Location: [facility] ORS;  Service: Orthopedics;  Laterality: Right;  ? IR IMAGING GUIDED PORT INSERTION  11/28/2020  ? LEFT HEART CATH AND CORONARY ANGIOGRAPHY N/A 03/28/2017  ? Procedure: Left Heart Cath and Coronary Angiography;  Surgeon: Lorretta Harp, MD;  Location: [place] CV LAB;  Service: Cardiovascular;  Laterality: N/A;  ? ORIF WRIST FRACTURE Left 03/31/2021  ? Procedure: OPEN REDUCTION INTERNAL FIXATION (ORIF) LEFT DISTAL RADIUS FRACTURE.;  Surgeon: Corky Mull, MD;  Location: [facility] ORS;  Service: Orthopedics;  Laterality: Left;  ? ? ?Social History  ? ?Socioeconomic History  ? Marital status: Married  ?  Spouse name: Gene  ? Number of children: 2  ? Years of education: Not on file  ? Highest education level: Not on file  ?Occupational History  ? Not  on file  ?Tobacco Use  ? Smoking status: Former  ?  Packs/day: 1.00  ?  Years: 30.00  ?  Pack years: 30.00  ?  Types: Cigarettes  ?  Quit date: 09/17/2020  ?  Years since quitting: 1.3  ? Smokeless tobacco: Never  ?Vaping Use  ? Vaping Use: Never used  ?Substance and Sexual Activity  ? Alcohol use: Not Currently  ?  Alcohol/week: 1.0 standard drink  ?  Types: 1 Standard drinks or equivalent per week  ?  Comment: beer occ.  ? Drug use: No  ? Sexual activity: Yes  ?  Birth control/protection: Post-menopausal  ?Other Topics Concern  ? Not on file  ?Social History Narrative  ? Lives in [place] with her husband.  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  ? ?Social Determinants of Health  ? ?Financial Resource Strain: Not on file  ?Food Insecurity: Not on file  ?Transportation Needs: Not on file  ?Physical Activity: Not on file  ?Stress: Not on file  ?Social Connections: Not on file  ?Intimate Partner Violence: Not on file  ? ? ?Family History  ?Problem Relation Age of Onset  ? Diabetes Mother   ?     alive @ 47  ? Goiter Mother   ? Heart disease Father   ?  died of MI @ 9  ? Osteoporosis Maternal Grandmother   ? Colon cancer Maternal Grandfather   ? Breast cancer Neg Hx   ? Ovarian cancer Neg Hx   ? ? ? ?Current Outpatient Medications:  ?  acetaminophen (TYLENOL) 325 MG tablet, Take 1-2 tablets (325-650 mg total) by mouth every 6 (six) hours as needed for mild pain (pain score 1-3 or temp > 100.5)., Disp: , Rfl:  ?  diphenoxylate-atropine (LOMOTIL) 2.5-0.025 MG tablet, Take 1-2 tablets by mouth 4 (four) times daily as needed., Disp: , Rfl:  ?  famotidine (PEPCID) 20 MG tablet, Take 20 mg by mouth in the morning., Disp: , Rfl:  ?  folic acid (FOLVITE) 1 MG tablet, Take 1 tablet (1 mg total) by mouth daily., Disp: 90 tablet, Rfl: 1 ?  lidocaine-prilocaine (EMLA) cream, Apply to affected area once (Patient taking differently: Apply 1 application. topically as needed (prior to  port being accessed).), Disp: 30 g, Rfl: 3 ?  metoprolol tartrate (LOPRESSOR) 25 MG tablet, Take 25 mg by mouth in the morning., Disp: , Rfl:  ?  Pembrolizumab (KEYTRUDA IV), Inject into the vein. Ever

## 2022-01-22 NOTE — Addendum Note (Signed)
Addended by: Luella Cook on: 01/22/2022 10:32 AM ? ? Modules accepted: Orders ? ?

## 2022-01-23 ENCOUNTER — Encounter: Payer: Self-pay | Admitting: Oncology

## 2022-01-24 LAB — C DIFFICILE, CYTOTOXIN B

## 2022-01-24 LAB — C DIFFICILE TOXINS A+B W/RFLX: C difficile Toxins A+B, EIA: NEGATIVE

## 2022-01-25 ENCOUNTER — Telehealth: Payer: Self-pay

## 2022-01-25 NOTE — Telephone Encounter (Signed)
Patient verbalized  of results  ?

## 2022-01-25 NOTE — Telephone Encounter (Signed)
-----   Message from Lin Landsman, MD sent at 01/24/2022 11:48 PM EDT ----- ?Please inform patient that her CRP levels are improving which is a good sign, CRP is a marker of inflammation ? ?RV ?

## 2022-01-26 ENCOUNTER — Encounter: Payer: Self-pay | Admitting: Oncology

## 2022-01-26 LAB — HEPATITIS B SURFACE ANTIBODY,QUALITATIVE

## 2022-01-26 LAB — COMPREHENSIVE METABOLIC PANEL
ALT: 26 IU/L (ref 0–32)
AST: 19 IU/L (ref 0–40)
Albumin/Globulin Ratio: 1.5 (ref 1.2–2.2)
Albumin: 3.7 g/dL — ABNORMAL LOW (ref 3.8–4.9)
Alkaline Phosphatase: 84 IU/L (ref 44–121)
BUN/Creatinine Ratio: 8 — ABNORMAL LOW (ref 9–23)
BUN: 7 mg/dL (ref 6–24)
Bilirubin Total: 0.5 mg/dL (ref 0.0–1.2)
CO2: 19 mmol/L — ABNORMAL LOW (ref 20–29)
Calcium: 8.7 mg/dL (ref 8.7–10.2)
Chloride: 103 mmol/L (ref 96–106)
Creatinine, Ser: 0.89 mg/dL (ref 0.57–1.00)
Globulin, Total: 2.5 g/dL (ref 1.5–4.5)
Glucose: 96 mg/dL (ref 70–99)
Potassium: 4.1 mmol/L (ref 3.5–5.2)
Sodium: 137 mmol/L (ref 134–144)
Total Protein: 6.2 g/dL (ref 6.0–8.5)
eGFR: 77 mL/min/{1.73_m2} (ref >59)

## 2022-01-26 LAB — QUANTIFERON-TB GOLD PLUS
QuantiFERON Mitogen Value: >10 IU/mL
QuantiFERON Nil Value: 0.13 IU/mL
QuantiFERON TB1 Ag Value: 0.12 IU/mL
QuantiFERON TB2 Ag Value: 0.1 IU/mL
QuantiFERON-TB Gold Plus: NEGATIVE

## 2022-01-26 LAB — CMV DNA, QUANTITATIVE, PCR: CMV DNA Quant: NEGATIVE IU/mL

## 2022-01-26 LAB — CBC WITH DIFFERENTIAL/PLATELET

## 2022-01-26 LAB — HEPATITIS C ANTIBODY

## 2022-01-26 LAB — HEPATITIS B E ANTIBODY: Hep B E Ab: NEGATIVE

## 2022-01-26 LAB — HEPATITIS B CORE ANTIBODY, TOTAL

## 2022-01-26 LAB — HEPATITIS B SURFACE ANTIGEN

## 2022-01-26 LAB — HEPATITIS B E ANTIGEN

## 2022-01-26 LAB — HEPATITIS A ANTIBODY, TOTAL

## 2022-01-26 LAB — HIV ANTIBODY (ROUTINE TESTING W REFLEX): HIV Screen 4th Generation wRfx: NONREACTIVE

## 2022-01-26 LAB — C-REACTIVE PROTEIN: CRP: 20 mg/L — ABNORMAL HIGH (ref 0–10)

## 2022-01-28 ENCOUNTER — Other Ambulatory Visit: Payer: Self-pay | Admitting: Gastroenterology

## 2022-01-28 ENCOUNTER — Other Ambulatory Visit: Payer: Self-pay

## 2022-01-28 DIAGNOSIS — K529 Noninfective gastroenteritis and colitis, unspecified: Secondary | ICD-10-CM | POA: Diagnosis not present

## 2022-01-28 NOTE — Progress Notes (Unsigned)
Per Dr. Devota Pace, please order GI profile PCR and Crp, thanks

## 2022-01-29 DIAGNOSIS — L821 Other seborrheic keratosis: Secondary | ICD-10-CM | POA: Diagnosis not present

## 2022-01-29 DIAGNOSIS — D225 Melanocytic nevi of trunk: Secondary | ICD-10-CM | POA: Diagnosis not present

## 2022-01-29 DIAGNOSIS — D2261 Melanocytic nevi of right upper limb, including shoulder: Secondary | ICD-10-CM | POA: Diagnosis not present

## 2022-01-29 DIAGNOSIS — Z85828 Personal history of other malignant neoplasm of skin: Secondary | ICD-10-CM | POA: Diagnosis not present

## 2022-01-29 LAB — C-REACTIVE PROTEIN: CRP: 16 mg/L — ABNORMAL HIGH (ref 0–10)

## 2022-01-30 LAB — GI PROFILE, STOOL, PCR

## 2022-02-01 ENCOUNTER — Telehealth: Payer: Self-pay

## 2022-02-01 MED ORDER — DOXYCYCLINE MONOHYDRATE 100 MG PO TABS: 100.0000 mg | ORAL_TABLET | Freq: Two times a day (BID) | ORAL | 0 refills | Status: AC

## 2022-02-01 NOTE — Progress Notes (Signed)
FYI

## 2022-02-01 NOTE — Telephone Encounter (Signed)
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  She does not have C. difficile infection on repeat stool studies.  She should continue the C. difficile treatment and also recommend to start doxycycline 100 mg p.o. twice daily for 5 days to treat new infection  Rohini Vanga

## 2022-02-01 NOTE — Telephone Encounter (Signed)
Patient verbalized understanding of results and sent medication to [facility]

## 2022-02-11 ENCOUNTER — Ambulatory Visit (INDEPENDENT_AMBULATORY_CARE_PROVIDER_SITE_OTHER): Payer: 59 | Admitting: Gastroenterology

## 2022-02-11 ENCOUNTER — Other Ambulatory Visit: Payer: Self-pay

## 2022-02-11 ENCOUNTER — Encounter: Payer: Self-pay | Admitting: Gastroenterology

## 2022-02-11 VITALS — BP 132/94 | HR 108 | Temp 98.8°F | Ht 66.0 in | Wt 214.4 lb

## 2022-02-11 DIAGNOSIS — K529 Noninfective gastroenteritis and colitis, unspecified: Secondary | ICD-10-CM

## 2022-02-11 DIAGNOSIS — Z8619 Personal history of other infectious and parasitic diseases: Secondary | ICD-10-CM

## 2022-02-11 DIAGNOSIS — A0472 Enterocolitis due to Clostridium difficile, not specified as recurrent: Secondary | ICD-10-CM | POA: Diagnosis not present

## 2022-02-11 DIAGNOSIS — K523 Indeterminate colitis: Secondary | ICD-10-CM | POA: Diagnosis not present

## 2022-02-11 IMAGING — CT CT BIOPSY
1 of 5 series · 8 of 32 positions shown, 13 images · non-contrast
Comparison: PET-CT-10/02/2010

INDICATION: History of poorly differentiated carcinoma. Please perform left
adrenal gland nodule biopsy for the acquisition of additional tissue
for molecular testing.

EXAM:
CT-GUIDED LEFT ADRENAL GLAND BIOPSY

[Series 2: i-spiral 5.0 b30f · axial · 0.77mm/px · z∈[-256,-102]mm · 8 of 58 slices shown, 13 images]
[im 7/58  soft-tissue]
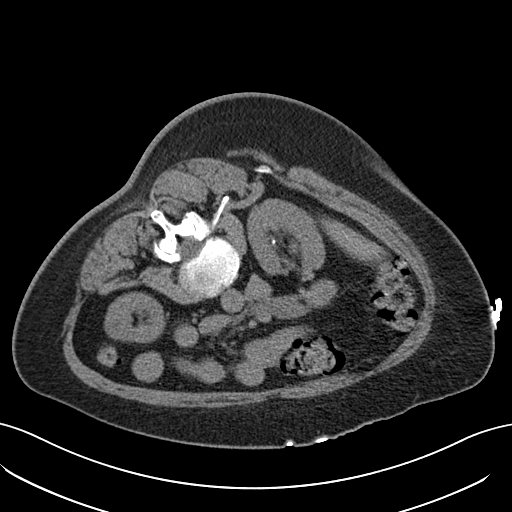
[im 7/58  bone]
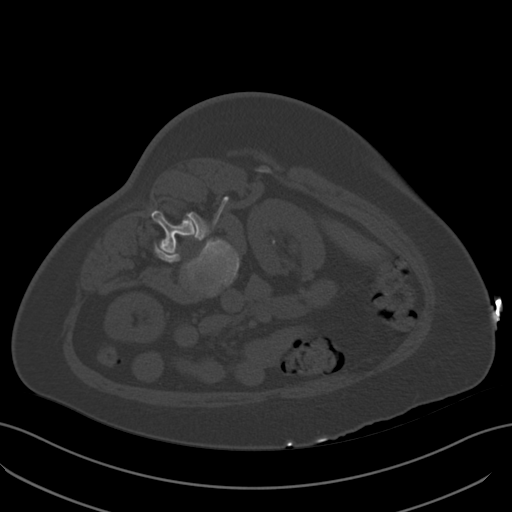
[im 13/58  soft-tissue]
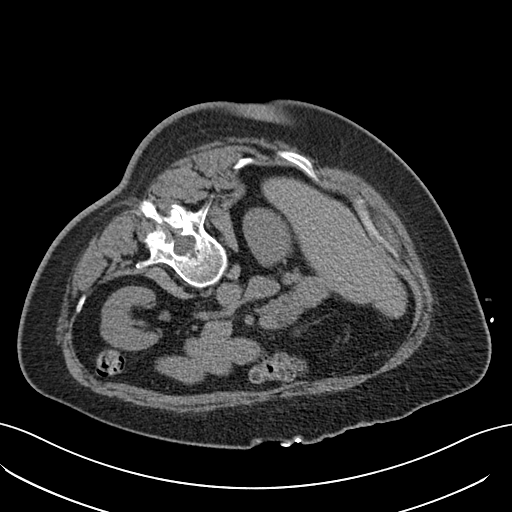
[im 20/58  soft-tissue]
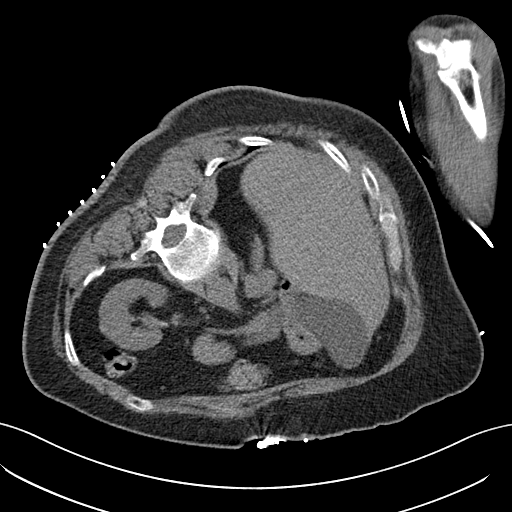
[im 26/58  soft-tissue]
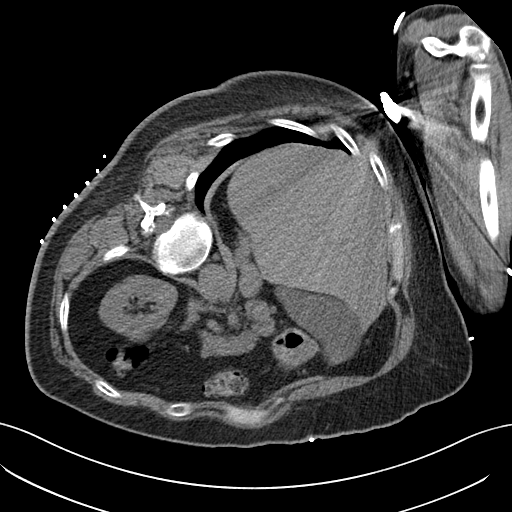
[im 32/58  soft-tissue]
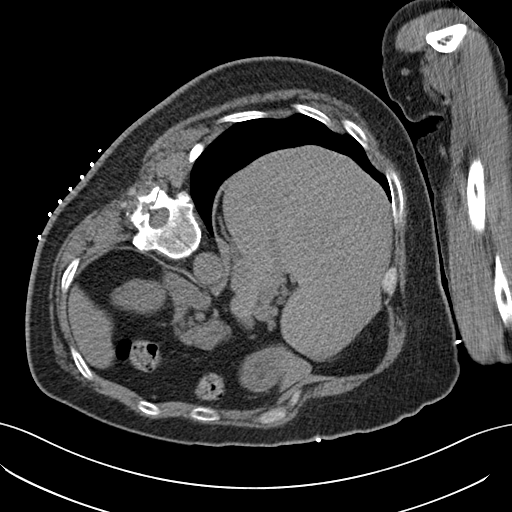
[im 32/58  lung]
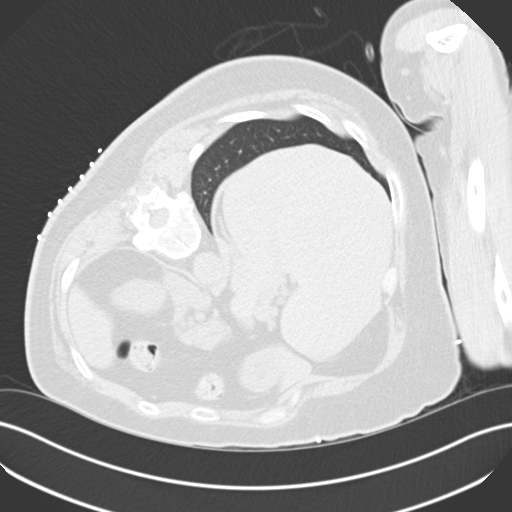
[im 39/58  soft-tissue]
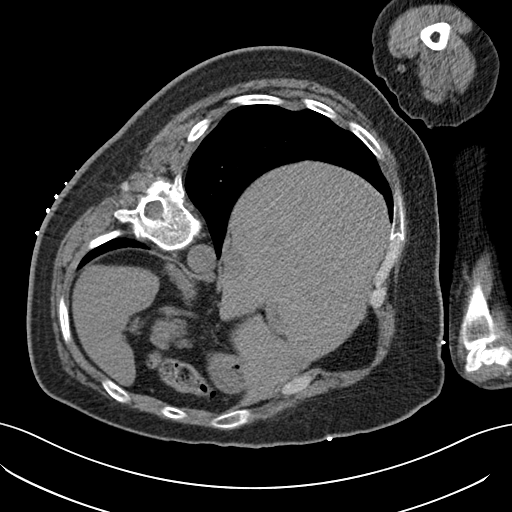
[im 39/58  lung]
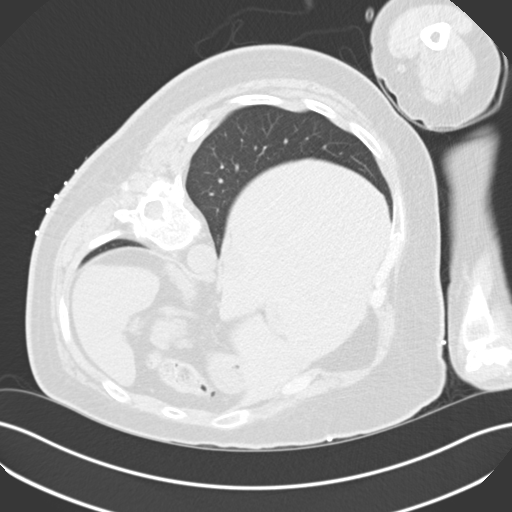
[im 45/58  soft-tissue]
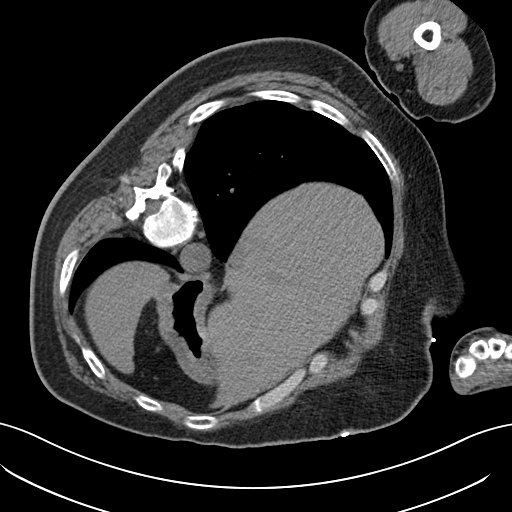
[im 45/58  lung]
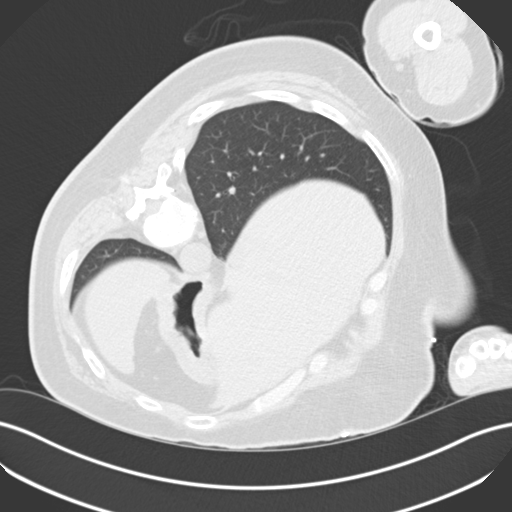
[im 51/58  soft-tissue]
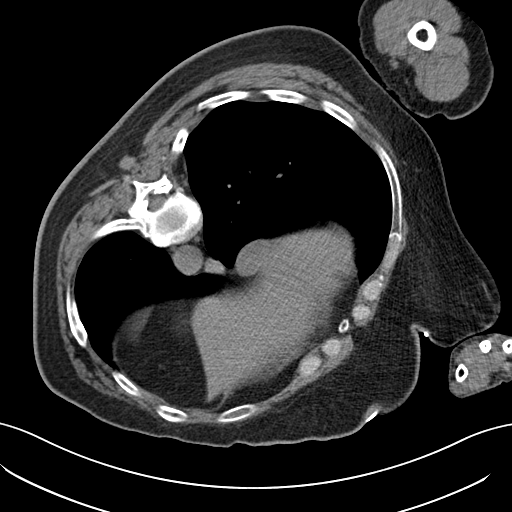
[im 51/58  lung]
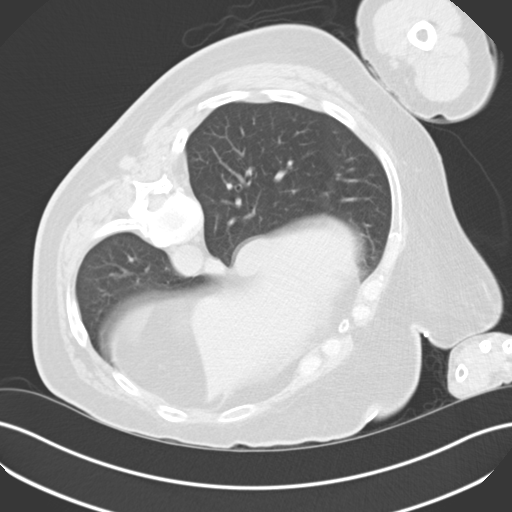

[8 of 32 positions shown; findings below may reference images not displayed]

MEDICATIONS:
None.

ANESTHESIA/SEDATION:
Fentanyl 150 mcg IV; Versed 3 mg IV

Sedation time: 50 minutes; The patient was continuously monitored
during the procedure by the interventional radiology nurse under my
direct supervision.

CONTRAST:  None.

COMPLICATIONS:
None immediate.

PROCEDURE:
Informed consent was obtained from the patient following an
explanation of the procedure, risks, benefits and alternatives. A
time out was performed prior to the initiation of the procedure.

The patient was positioned initially right lateral decubitus and
ultimately prone on the CT table and a limited CT was performed for
procedural planning demonstrating unchanged masslike thickening
involving primarily the medial limb of the left adrenal gland with
dominant component measuring approximately 4.8 x 1.6 cm (image 14,
series 5). The procedure was planned. The operative site was prepped
and draped in the usual sterile fashion. Appropriate trajectory was
confirmed with a 22 gauge spinal needle after the adjacent tissues
were anesthetized with 1% Lidocaine with epinephrine.

Under intermittent CT guidance, a 17 gauge coaxial needle was
advanced into the peripheral aspect of the masslike thickening
involving the medial limb of the left adrenal gland.

Appropriate positioning was confirmed and 8 core needle biopsy
samples were obtained with an 18 gauge core needle biopsy device.
The co-axial needle was removed following administration of a
Gel-Foam slurry and superficial hemostasis was achieved with manual
compression.

A limited postprocedural CT was negative for hemorrhage or
additional complication. A dressing was placed. The patient
tolerated the procedure well without immediate postprocedural
complication.
IMPRESSION: Technically successful CT guided core needle biopsy of masslike
thickening involving the medial limb of the left adrenal gland.

## 2022-02-11 NOTE — Patient Instructions (Signed)
Call and Cancel appointment with [facility] GI there phone number is [phone]

## 2022-02-11 NOTE — Progress Notes (Signed)
Cephas Darby, MD [address]  [place]  [place], [place] [address]  Main: [phone]  Fax: [phone]    Gastroenterology Consultation  Referring Provider:     Remi Haggard, FNP Primary Care Physician:  Remi Haggard, FNP Primary Gastroenterologist:  Dr. Cephas Darby Reason for Consultation: Colitis        HPI:   Gina Sanchez is a 56 y.o. female referred by Dr. 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 The patient has a history of metastatic lung cancer with bone, lymph, adrenal metastasis.  Follows with Dr. Janese Banks in oncology.  Commenced on Keytruda on 01/01/2022.  Presented to the emergency room on 01/03/2022 with mucus diarrheal stools going on for a month.  On November 12, 2021 CT chest abdomen pelvis with contrast showed no gross abnormalities of the GI tract except sigmoid diverticulosis.  On 12/23/2021 GI PCR panel and C. difficile quick screen were negative.  Due to some issues with the lab interface the results in epic does not show a positive C. difficile toxin but the faxed result from the labs which arrived much later showed a positive toxin of C. difficile.  Dr. Marius Ditch did a colonoscopy on 01/06/2022 to evaluate further that showed mild to moderate inflammation with altered vascular tree congestion erythema erosions friability and mucus in the rectum sigmoid colon ascending colon and cecum suggesting of pancolitis.  Biopsies were taken.  And showed normal features in the small bowel but moderate acute colitis in the ascending colon, severe colitis in the transverse colon with no abnormalities in the descending colon but moderate to severe colitis in the rectum and sigmoid colon with evidence of chronicity in the entire colon biopsies   Initially was commenced on steroids but after the C. difficile toxin results came back positive the steroids were stopped and commenced on vancomycin 125 mg 4 times daily.   Patient was seen by Dr. Vicente Males on 01/19/2022 and she reported some improvement in her diarrheal symptoms.  Her CRP was found to be elevated at 2 mg/dL.  Today, patient is accompanied by her husband and she reports continued improvement in her symptoms.  She is no longer experiencing rectal bleeding and she reports feeling 50% better.  She used to have bowel movement every hour, down to 5-6 times daily.  Her main concern is rectal pressure and urgency.  She reports her appetite is improving, denies any abdominal bloating.  She denies any fever, chills, nausea.  Her husband also states that she looks a lot better.  Labs from 5/9 revealed normal CBC, CMP, celiac disease panel. Patient is not on prednisone or hydrocortisone  Follow-up visit 02/11/2022 Patient is here for follow-up of colitis.  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  Patient had when she was tapering vancomycin.  Clear fluid underwent repeat stool studies which came back negative for C. difficile toxin A/B, however positive for Yersinia enterocolitica, therefore patient was treated with 5 days course of doxycycline 100 mg patient has self discontinued vancomycin taper when she was taking 2 pills daily.  She states that given that she did not notice much improvement in her symptoms, she decided to stop when she found that she has Yersinia but not C. difficile.  Patient reports that rectal bleeding has resolved, along with bowel urgency.  She  continues to have up to 6 pudding-like, nonbloody brown bowel movements daily.  She denies nocturnal diarrhea.  She reports ongoing fatigue.  She denies any weight loss or loss of appetite.  She likes to eat out on a regular basis.  NSAIDs: None  Antiplts/Anticoagulants/Anti thrombotics: None  GI Procedures:  Colonoscopy 01/06/2022 - Segmental moderate inflammation was found in the rectum, in the  recto-sigmoid colon, in the sigmoid colon, in the ascending colon and in the cecum secondary to colitis. - The distal rectum and anal verge are normal on retroflexion view. - Diverticulosis in the entire examined colon.  DIAGNOSIS:  A.  TERMINAL ILEUM; BIOPSIES:  - SMALL BOWEL MUCOSA WITH NORMAL VILLOUS ARCHITECTURE AND SCATTERED MILD  LYMPHOID HYPERPLASIA.  - NO EVIDENCE OF SIGNIFICANT ATYPIA.   B.  COLON, ASCENDING; BIOPSIES;  - MODERATE ACUTE COLITIS (SEE COMMENT)   C.  COLON, TRANSVERSE, BIOPSIES;  - FOCAL SEVERE ACUTE COLITIS (SEE COMMENT).   D.  COLON, DESCENDING; BIOPSIES:  COLONIC MUCOSA WITH NO SPECIFIC HISTOLOGIC ABNORMALITY.   E.  COLON, SIGMOID; BIOPSIES:  - COLONIC MUCOSA WITH MODERATE TO SEVERE ACUTE COLITIS (SEE COMMENT).   F.  RECTUM; BIOPSIES:  - DIFFUSE SEVERE ACUTE COLITIS (SEE COMMENT).  Comment: Biopsies from the ascending transverse sigmoid and rectum show  diffuse increased acute and chronic inflammation with numerous areas of  cryptitis and crypt abscess.  There is miniaturization of crypts.  Significant complex architectural branching is not seen.  There is  marked reactive nuclear change but no definite dysplasia. There are  occasional cells with prominent eosinophilic nucleoli. The pattern  present is nonspecific but has features that have been associated with  Keytruda induced colitis (or other medication effects).  An acute  ischemic colitis would also be in the differential.  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   Palpitations    Precordial chest pain    Wrist fracture 03/2021   left    Past Surgical History:  Procedure Laterality Date   BREAST BIOPSY Right 2017   benign   CERVICAL BIOPSY  W/ LOOP  ELECTRODE EXCISION     COLONOSCOPY WITH PROPOFOL N/A 07/03/2015   Procedure: COLONOSCOPY WITH PROPOFOL;  Surgeon: Hulen Luster, MD;  Location: [facility] ENDOSCOPY;  Service: Gastroenterology;  Laterality: N/A;   COLONOSCOPY WITH PROPOFOL N/A 01/06/2022   Procedure: COLONOSCOPY WITH PROPOFOL;  Surgeon: Lin Landsman, MD;  Location: [facility] ENDOSCOPY;  Service: Gastroenterology;  Laterality: N/A;   ESOPHAGOGASTRODUODENOSCOPY (EGD) WITH PROPOFOL N/A 07/03/2015   Procedure: ESOPHAGOGASTRODUODENOSCOPY (EGD) WITH PROPOFOL;  Surgeon: Hulen Luster, MD;  Location: [facility] ENDOSCOPY;  Service: Gastroenterology;  Laterality: N/A;   FRACTURE SURGERY     INTRAMEDULLARY (IM) NAIL INTERTROCHANTERIC Right 09/15/2020   Procedure: INTRAMEDULLARY (IM) NAIL INTERTROCHANTRIC;  Surgeon: Corky Mull, MD;  Location: [facility] ORS;  Service: Orthopedics;  Laterality: Right;   IR IMAGING GUIDED PORT INSERTION  11/28/2020   LEFT HEART CATH AND CORONARY ANGIOGRAPHY N/A 03/28/2017   Procedure: Left Heart Cath and Coronary Angiography;  Surgeon: Lorretta Harp, MD;  Location: [facility];  Service: Cardiovascular;  Laterality: N/A;   ORIF WRIST FRACTURE Left 03/31/2021   Procedure: OPEN REDUCTION INTERNAL  FIXATION (ORIF) LEFT DISTAL RADIUS FRACTURE.;  Surgeon: Corky Mull, MD;  Location: [facility] ORS;  Service: Orthopedics;  Laterality: Left;   Current Outpatient Medications:    ALPRAZolam (XANAX) 0.5 MG tablet, Take 1 tablet (0.5 mg total) by mouth daily as needed., Disp: 30 tablet, Rfl: 0   famotidine (PEPCID) 20 MG tablet, Take 20 mg by mouth in the morning., Disp: , Rfl:    folic acid (FOLVITE) 1 MG tablet, Take 1 tablet (1 mg total) by mouth daily., Disp: 90 tablet, Rfl: 1   hydrocortisone (CORTEF) 20 MG tablet, Take 20 mg by mouth every morning., Disp: , Rfl:    lidocaine-prilocaine (EMLA) cream, Apply to affected area once (Patient taking differently: Apply 1 application. topically as needed (prior to port being accessed).),  Disp: 30 g, Rfl: 3   metoprolol tartrate (LOPRESSOR) 25 MG tablet, Take 25 mg by mouth in the morning., Disp: , Rfl:    ondansetron (ZOFRAN) 4 MG tablet, TAKE 1 TABLET BY MOUTH EVERY 8 HOURS AS NEEDED FOR NAUSEA AND VOMITING, Disp: 20 tablet, Rfl: 0   oxyCODONE (OXY IR/ROXICODONE) 5 MG immediate release tablet, TAKE 1 TO 2 TABLETS BY MOUTH EVERY 4 HOURS AS NEEDED FOR MODERATE PAIN, Disp: 60 tablet, Rfl: 0   Pembrolizumab (KEYTRUDA IV), Inject into the vein. Every 3 weeks, Disp: , Rfl:    PEMEtrexed Disodium (ALIMTA IV), Inject into the vein. Every 3 weeks, Disp: , Rfl:    prochlorperazine (COMPAZINE) 10 MG tablet, Take 1 tablet (10 mg total) by mouth every 6 (six) hours as needed (Nausea or vomiting)., Disp: 30 tablet, Rfl: 1 No current facility-administered medications for this visit.  Facility-Administered Medications Ordered in Other Visits:    sodium chloride flush (NS) 0.9 % injection 10 mL, 10 mL, Intravenous, PRN, Sindy Guadeloupe, MD, 10 mL at 03/09/21 0906   Zoledronic Acid (ZOMETA) IVPB 4 mg, 4 mg, Intravenous, Q28 days, Sindy Guadeloupe, MD, Stopped at 11/21/20 1100    Family History  Problem Relation Age of Onset   Diabetes Mother        alive @ 37   Goiter Mother    Heart disease Father        died of MI @ 82   Osteoporosis Maternal Grandmother    Colon cancer Maternal Grandfather    Breast cancer Neg Hx    Ovarian cancer Neg Hx      Social History   Tobacco Use   Smoking status: Former    Packs/day: 1.00    Years: 30.00    Pack years: 30.00    Types: Cigarettes    Quit date: 09/17/2020    Years since quitting: 1.4   Smokeless tobacco: Never  Vaping Use   Vaping Use: Never used  Substance Use Topics   Alcohol use: Not Currently    Alcohol/week: 1.0 standard drink    Types: 1 Standard drinks or equivalent per week    Comment: beer occ.   Drug use: No    Allergies as of 02/11/2022 - Review Complete 02/11/2022  Allergen Reaction Noted   Factive [gemifloxacin]  Rash 07/03/2015    Review of Systems:    All systems reviewed and negative except where noted in HPI.   Physical Exam:  BP (!) 132/94 (BP Location: Left Arm, Patient Position: Sitting, Cuff Size: Normal)   Pulse (!) 108   Temp 98.8 F (37.1 C) (Oral)   Ht 5' 6"  (1.676 m)   Wt 214 lb 6 oz (  97.2 kg)   LMP 09/15/2018   BMI 34.60 kg/m  Patient's last menstrual period was 09/15/2018.  General:   Alert,  Well-developed, well-nourished, pleasant and cooperative in NAD Head:  Normocephalic and atraumatic. Eyes:  Sclera clear, no icterus.   Conjunctiva pink. Ears:  Normal auditory acuity. Nose:  No deformity, discharge, or lesions. Mouth:  No deformity or lesions,oropharynx pink & moist. Neck:  Supple; no masses or thyromegaly. Lungs:  Respirations even and unlabored.  Clear throughout to auscultation.   No wheezes, crackles, or rhonchi. No acute distress. Heart:  Regular rate and rhythm; no murmurs, clicks, rubs, or gallops. Abdomen:  Normal bowel sounds. Soft, non-tender and non-distended without masses, hepatosplenomegaly or hernias noted.  No guarding or rebound tenderness.   Rectal: Not performed Msk:  Symmetrical without gross deformities. Good, equal movement & strength bilaterally. Pulses:  Normal pulses noted. Extremities:  No clubbing or edema.  No cyanosis. Neurologic:  Alert and oriented x3;  grossly normal neurologically. Skin:  Intact without significant lesions or rashes. No jaundice. Psych:  Alert and cooperative. Normal mood and affect.  Imaging Studies: Reviewed  Assessment and Plan:   LUNETTA MARINA is a 56 y.o. pleasant Caucasian female with history of metastatic adenocarcinoma of the lung on Keytruda is seen in for consultation of new onset of pancolitis as well as positive C. 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 difficile infection.  Patient was treated with oral vancomycin 125 mg 4 times daily for 2  weeks followed by short taper.  Due to worsening of diarrhea, repeat stool studies were performed which were negative for C. difficile toxin A and B, positive for Yersinia enterocolitica.  This was treated with 5 days course of doxycycline 100 mg twice daily.  Overall, patient showed improvement in her symptoms other than ongoing diarrhea.  Patient used to have 10-12 bowel movements daily, currently down to 6 bowel movements daily.  She is no longer experiencing rectal bleeding, urgency, nocturnal diarrhea. Recommend to start prednisone 40 mg daily for 2 weeks followed by taper.  Patient should notify me when she is on 40 mg dose whether or not her diarrhea is improving Check CRP today.  May consider doing flexible sigmoidoscopy if her diarrhea is not improving before starting Entyvio   Follow up in 4 weeks   Cephas Darby, MD

## 2022-02-12 ENCOUNTER — Inpatient Hospital Stay: Payer: 59 | Attending: Oncology

## 2022-02-12 ENCOUNTER — Inpatient Hospital Stay: Payer: 59

## 2022-02-12 ENCOUNTER — Encounter: Payer: Self-pay | Admitting: Oncology

## 2022-02-12 ENCOUNTER — Inpatient Hospital Stay (HOSPITAL_BASED_OUTPATIENT_CLINIC_OR_DEPARTMENT_OTHER): Payer: 59 | Admitting: Oncology

## 2022-02-12 VITALS — BP 115/78 | HR 76 | Temp 96.2°F | Resp 20 | Wt 214.6 lb

## 2022-02-12 DIAGNOSIS — C3491 Malignant neoplasm of unspecified part of right bronchus or lung: Secondary | ICD-10-CM

## 2022-02-12 DIAGNOSIS — K219 Gastro-esophageal reflux disease without esophagitis: Secondary | ICD-10-CM | POA: Diagnosis not present

## 2022-02-12 DIAGNOSIS — Z87891 Personal history of nicotine dependence: Secondary | ICD-10-CM | POA: Insufficient documentation

## 2022-02-12 DIAGNOSIS — E2740 Unspecified adrenocortical insufficiency: Secondary | ICD-10-CM | POA: Insufficient documentation

## 2022-02-12 DIAGNOSIS — A0472 Enterocolitis due to Clostridium difficile, not specified as recurrent: Secondary | ICD-10-CM | POA: Diagnosis not present

## 2022-02-12 DIAGNOSIS — C779 Secondary and unspecified malignant neoplasm of lymph node, unspecified: Secondary | ICD-10-CM | POA: Diagnosis not present

## 2022-02-12 DIAGNOSIS — K921 Melena: Secondary | ICD-10-CM | POA: Diagnosis not present

## 2022-02-12 DIAGNOSIS — Z79899 Other long term (current) drug therapy: Secondary | ICD-10-CM | POA: Diagnosis not present

## 2022-02-12 DIAGNOSIS — Z5111 Encounter for antineoplastic chemotherapy: Secondary | ICD-10-CM | POA: Diagnosis not present

## 2022-02-12 DIAGNOSIS — I10 Essential (primary) hypertension: Secondary | ICD-10-CM | POA: Diagnosis not present

## 2022-02-12 DIAGNOSIS — C7970 Secondary malignant neoplasm of unspecified adrenal gland: Secondary | ICD-10-CM | POA: Insufficient documentation

## 2022-02-12 DIAGNOSIS — C7951 Secondary malignant neoplasm of bone: Secondary | ICD-10-CM | POA: Insufficient documentation

## 2022-02-12 LAB — CBC WITH DIFFERENTIAL/PLATELET
Abs Immature Granulocytes: 0.04 10*3/uL (ref 0.00–0.07)
Basophils Absolute: 0.1 10*3/uL (ref 0.0–0.1)
Basophils Relative: 1 %
Eosinophils Absolute: 0.1 10*3/uL (ref 0.0–0.5)
Eosinophils Relative: 1 %
HCT: 42.2 % (ref 36.0–46.0)
Hemoglobin: 14 g/dL (ref 12.0–15.0)
Immature Granulocytes: 0 %
Lymphocytes Relative: 20 %
Lymphs Abs: 2.1 10*3/uL (ref 0.7–4.0)
MCH: 29.5 pg (ref 26.0–34.0)
MCHC: 33.2 g/dL (ref 30.0–36.0)
MCV: 88.8 fL (ref 80.0–100.0)
Monocytes Absolute: 0.5 10*3/uL (ref 0.1–1.0)
Monocytes Relative: 5 %
Neutro Abs: 7.8 10*3/uL — ABNORMAL HIGH (ref 1.7–7.7)
Neutrophils Relative %: 73 %
Platelets: 338 10*3/uL (ref 150–400)
RBC: 4.75 MIL/uL (ref 3.87–5.11)
RDW: 14 % (ref 11.5–15.5)
WBC: 10.6 10*3/uL — ABNORMAL HIGH (ref 4.0–10.5)
nRBC: 0 % (ref 0.0–0.2)

## 2022-02-12 LAB — COMPREHENSIVE METABOLIC PANEL
ALT: 19 U/L (ref 0–44)
AST: 18 U/L (ref 15–41)
Albumin: 3.4 g/dL — ABNORMAL LOW (ref 3.5–5.0)
Alkaline Phosphatase: 76 U/L (ref 38–126)
Anion gap: 9 (ref 5–15)
BUN: 9 mg/dL (ref 6–20)
CO2: 23 mmol/L (ref 22–32)
Calcium: 8.3 mg/dL — ABNORMAL LOW (ref 8.9–10.3)
Chloride: 104 mmol/L (ref 98–111)
Creatinine, Ser: 0.83 mg/dL (ref 0.44–1.00)
GFR, Estimated: >60 mL/min (ref >60)
Glucose, Bld: 132 mg/dL — ABNORMAL HIGH (ref 70–99)
Potassium: 4.2 mmol/L (ref 3.5–5.1)
Sodium: 136 mmol/L (ref 135–145)
Total Bilirubin: 1 mg/dL (ref 0.3–1.2)
Total Protein: 7.1 g/dL (ref 6.5–8.1)

## 2022-02-12 LAB — C-REACTIVE PROTEIN: CRP: 20 mg/L — ABNORMAL HIGH (ref 0–10)

## 2022-02-12 MED ORDER — SODIUM CHLORIDE 0.9 % IV SOLN
Freq: Once | INTRAVENOUS | Status: AC
  Filled 2022-02-12: qty 250

## 2022-02-12 MED ORDER — HEPARIN SOD (PORK) LOCK FLUSH 100 UNIT/ML IV SOLN
500.0000 [IU] | Freq: Once | INTRAVENOUS | Status: AC | PRN
  Administered 2022-02-12: 500 [IU]
  Filled 2022-02-12: qty 5

## 2022-02-12 MED ORDER — ONDANSETRON HCL 4 MG/2ML IJ SOLN
4.0000 mg | Freq: Once | INTRAMUSCULAR | Status: AC
  Administered 2022-02-12: 4 mg via INTRAVENOUS
  Filled 2022-02-12: qty 2

## 2022-02-12 MED ORDER — CYANOCOBALAMIN 1000 MCG/ML IJ SOLN
1000.0000 ug | INTRAMUSCULAR | Status: DC
  Administered 2022-02-12: 1000 ug via INTRAMUSCULAR
  Filled 2022-02-12: qty 1

## 2022-02-12 MED ORDER — SODIUM CHLORIDE 0.9 % IV SOLN
500.0000 mg/m2 | Freq: Once | INTRAVENOUS | Status: AC
  Administered 2022-02-12: 1000 mg via INTRAVENOUS
  Filled 2022-02-12: qty 40

## 2022-02-12 NOTE — Progress Notes (Signed)
Pt is still dealing with diarrhea. Dr. Marius Ditch has prescribed 30m of prednisone starting today for two weeks.

## 2022-02-12 NOTE — Patient Instructions (Signed)
[facility] AT [place]  Discharge Instructions: Thank you for choosing [facility] to provide your oncology and hematology care.  If you have a lab appointment with the [facility], please go directly to the [facility] and check in at the registration area.  Wear comfortable clothing and clothing appropriate for easy access to any Portacath or PICC line.   We strive to give you quality time with your provider. You may need to reschedule your appointment if you arrive late (15 or more minutes).  Arriving late affects you and other patients whose appointments are after yours.  Also, if you miss three or more appointments without notifying the office, you may be dismissed from the clinic at the provider's discretion.      For prescription refill requests, have your pharmacy contact our office and allow 72 hours for refills to be completed.     To help prevent nausea and vomiting after your treatment, we encourage you to take your nausea medication as directed.  BELOW ARE SYMPTOMS THAT SHOULD BE REPORTED IMMEDIATELY: *FEVER GREATER THAN 100.4 F (38 C) OR HIGHER *CHILLS OR SWEATING *NAUSEA AND VOMITING THAT IS NOT CONTROLLED WITH YOUR NAUSEA MEDICATION *UNUSUAL SHORTNESS OF BREATH *UNUSUAL BRUISING OR BLEEDING *URINARY PROBLEMS (pain or burning when urinating, or frequent urination) *BOWEL PROBLEMS (unusual diarrhea, constipation, pain near the anus) TENDERNESS IN MOUTH AND THROAT WITH OR WITHOUT PRESENCE OF ULCERS (sore throat, sores in mouth, or a toothache) UNUSUAL RASH, SWELLING OR PAIN  UNUSUAL VAGINAL DISCHARGE OR ITCHING   Items with * indicate a potential emergency and should be followed up as soon as possible or go to the Emergency Department if any problems should occur.  Please show the CHEMOTHERAPY ALERT CARD or IMMUNOTHERAPY ALERT CARD at check-in to the Emergency Department and triage nurse.  Should you have questions after your visit or  need to cancel or reschedule your appointment, please contact [facility] [place] [facility] [place]  [phone] and follow the prompts.  Office hours are 8:00 a.m. to 4:30 p.m. Monday - Friday. Please note that voicemails left after 4:00 p.m. may not be returned until the following business day.  We are closed weekends and major holidays. You have access to a nurse at all times for urgent questions. Please call the main number to the clinic [phone] and follow the prompts.  For any non-urgent questions, you may also contact your provider using MyChart. We now offer e-Visits for anyone 29 and older to request care online for non-urgent symptoms. For details visit [email or web address].   Also download the MyChart app! Go to the app store, search "MyChart", open the app, select [facility], and log in with your MyChart username and password.  Due to Covid, a mask is required upon entering the hospital/clinic. If you do not have a mask, one will be given to you upon arrival. For doctor visits, patients may have 1 support person aged 14 or older with them. For treatment visits, patients cannot have anyone with them due to current Covid guidelines and our immunocompromised population.

## 2022-02-14 ENCOUNTER — Encounter: Payer: Self-pay | Admitting: Oncology

## 2022-02-14 ENCOUNTER — Encounter: Payer: Self-pay | Admitting: Gastroenterology

## 2022-02-14 DIAGNOSIS — K625 Hemorrhage of anus and rectum: Secondary | ICD-10-CM

## 2022-02-14 NOTE — Progress Notes (Signed)
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  She underwent operative fixation on 10/12/2020.  MRI of femur showed heterogeneously enhancing osseous lesion within the area which would be nonspecific versus office neoplasm or metastatic lesion.  CT chest abdomen and pelvis with contrast showed an enlarged pretracheal lymph node 2.2 x 1.5 cm and a right paratracheal lymph node measuring 2.7 x 1.3 cm.  Prevascular node measuring 0.3 x 0.9 cm.  5 x 4 mm right middle lobe nodule.  2.8 x 1.9 cm left adrenal lesion.    Reamings from the right femur showed metastatic poorly differentiated carcinoma.  Immunohistochemistry showed was positive for pancytokeratin, CK7 and patchy CK20 with patchy dim expression of TTF-1.  Cells negative for Melan-A, CDX2, PAX8, Napsin A, GATA3, p40, CD56, p16 and thyroglobulin.  Findings compatible with metastatic carcinoma but because of decalcification immunohistochemical staining is unreliable.  Patchy dim staining with TTF-1 suspicious for lung primary but not a definitive diagnosis.   Repeat supraclavicular lymph node biopsy showed metastatic  adenocarcinoma.  Tumor cells positive for CK7 with focal weak staining for TTF-1.  Suggestive of lung origin in the proper clinical context.  Cells were negative for GATA3 PAX8 CDX2 and CK20 and Napsin A.  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  She is currently on hydrocortisone twice daily.  Thyroid functions presently are normal      Interval history-patient has completed her course of antibiotics for C. difficile as well as doxycycline for possible Yersinia.  She does not have blood in her stool anymore but continues to have small frequent episodes of diarrhea.  She has been able to work through these episodes.  Patient was seen by Dr. Marius Ditch and was started on prednisone 40 mg daily.  She will be following up with her closely.  She otherwise feels well and denies other complaints at this time  ECOG PS- 1 Pain scale- 0  Review of systems- Review of Systems  Constitutional:  Negative for chills, fever, malaise/fatigue and weight loss.  HENT:  Negative for congestion, ear discharge and nosebleeds.   Eyes:  Negative for blurred vision.  Respiratory:  Negative for cough, hemoptysis, sputum production, shortness of breath and wheezing.   Cardiovascular:  Negative for chest pain, palpitations, orthopnea and claudication.  Gastrointestinal:  Positive for diarrhea. Negative for abdominal pain, blood in stool, constipation, heartburn, melena, nausea and vomiting.  Genitourinary:  Negative for dysuria, flank pain, frequency, hematuria and urgency.  Musculoskeletal:  Negative for back pain, joint pain and myalgias.  Skin:  Negative for rash.  Neurological:  Negative for dizziness, tingling, focal weakness, seizures,  weakness and headaches.  Endo/Heme/Allergies:  Does not bruise/bleed easily.  Psychiatric/Behavioral:  Negative for depression and suicidal ideas.  The patient does not have insomnia.      Allergies  Allergen Reactions   Factive [Gemifloxacin] Rash     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   Palpitations    Precordial chest pain    Wrist fracture 03/2021   left     Past Surgical History:  Procedure Laterality Date   BREAST BIOPSY Right 2017   benign   CERVICAL BIOPSY  W/ LOOP ELECTRODE EXCISION     COLONOSCOPY WITH PROPOFOL N/A 07/03/2015   Procedure: COLONOSCOPY WITH PROPOFOL;  Surgeon: Hulen Luster, MD;  Location: [facility] ENDOSCOPY;  Service: Gastroenterology;  Laterality: N/A;   COLONOSCOPY WITH PROPOFOL N/A 01/06/2022   Procedure: COLONOSCOPY WITH PROPOFOL;  Surgeon: Lin Landsman, MD;  Location: [facility] ENDOSCOPY;  Service: Gastroenterology;  Laterality: N/A;   ESOPHAGOGASTRODUODENOSCOPY (EGD) WITH PROPOFOL N/A 07/03/2015   Procedure: ESOPHAGOGASTRODUODENOSCOPY (EGD) WITH PROPOFOL;  Surgeon: Hulen Luster, MD;  Location: [facility] ENDOSCOPY;  Service: Gastroenterology;  Laterality: N/A;   FRACTURE SURGERY     INTRAMEDULLARY (IM) NAIL INTERTROCHANTERIC Right 09/15/2020   Procedure: INTRAMEDULLARY (IM) NAIL INTERTROCHANTRIC;  Surgeon: Corky Mull, MD;  Location: [facility] ORS;  Service: Orthopedics;  Laterality: Right;   IR IMAGING GUIDED PORT INSERTION  11/28/2020   LEFT HEART CATH AND CORONARY ANGIOGRAPHY N/A 03/28/2017   Procedure: Left Heart Cath and Coronary Angiography;  Surgeon: Lorretta Harp, MD;  Location: [facility];  Service: Cardiovascular;  Laterality: N/A;   ORIF WRIST FRACTURE Left 03/31/2021   Procedure: OPEN REDUCTION INTERNAL FIXATION (ORIF) LEFT DISTAL RADIUS FRACTURE.;  Surgeon: Corky Mull, MD;  Location: [facility] ORS;  Service: Orthopedics;  Laterality: Left;     Social History   Socioeconomic History   Marital status: Married    Spouse name: Gene   Number of children: 2   Years of education: Not on file   Highest education level: Not on file  Occupational History   Not on file  Tobacco Use   Smoking status: Former    Packs/day: 1.00    Years: 30.00    Pack years: 30.00    Types: Cigarettes    Quit date: 09/17/2020    Years since quitting: 1.4   Smokeless tobacco: Never  Vaping Use   Vaping Use: Never used  Substance and Sexual Activity   Alcohol use: Not Currently    Alcohol/week: 1.0 standard drink    Types: 1 Standard drinks or equivalent per week    Comment: beer occ.   Drug use: No   Sexual activity: Yes    Birth control/protection: Post-menopausal  Other Topics Concern   Not on file  Social History Narrative   Lives in [place] with her husband.  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   Social Determinants of Health   Financial Resource Strain: Not on file  Food Insecurity: Not on file  Transportation Needs: Not on file  Physical Activity: Not on file  Stress: Not on file  Social Connections: Not on file  Intimate Partner Violence:  Not on file    Family History  Problem Relation Age of Onset   Diabetes Mother        alive @ 73   Goiter Mother    Heart disease Father        died of MI @ 59   Osteoporosis Maternal Grandmother    Colon cancer Maternal Grandfather    Breast cancer Neg Hx    Ovarian cancer Neg Hx      Current Outpatient Medications:    ALPRAZolam (XANAX) 0.5 MG tablet, Take 1 tablet (0.5 mg total) by mouth daily as needed., Disp: 30 tablet, Rfl: 0   famotidine (PEPCID) 20 MG tablet, Take 20 mg by mouth in the morning., Disp: , Rfl:    folic acid (FOLVITE) 1 MG tablet, Take 1 tablet (1 mg total) by mouth daily., Disp: 90 tablet, Rfl: 1   hydrocortisone (CORTEF) 20 MG tablet, Take 20 mg by mouth every morning., Disp: , Rfl:    metoprolol tartrate  (LOPRESSOR) 25 MG tablet, Take 25 mg by mouth in the morning., Disp: , Rfl:    ondansetron (ZOFRAN) 4 MG tablet, TAKE 1 TABLET BY MOUTH EVERY 8 HOURS AS NEEDED FOR NAUSEA AND VOMITING, Disp: 20 tablet, Rfl: 0   oxyCODONE (OXY IR/ROXICODONE) 5 MG immediate release tablet, TAKE 1 TO 2 TABLETS BY MOUTH EVERY 4 HOURS AS NEEDED FOR MODERATE PAIN, Disp: 60 tablet, Rfl: 0   Pembrolizumab (KEYTRUDA IV), Inject into the vein. Every 3 weeks, Disp: , Rfl:    PEMEtrexed Disodium (ALIMTA IV), Inject into the vein. Every 3 weeks, Disp: , Rfl:    predniSONE (DELTASONE) 20 MG tablet, Take 40 mg by mouth daily with breakfast. Per Dr. Glendora Score two weeks., Disp: , Rfl:    prochlorperazine (COMPAZINE) 10 MG tablet, Take 1 tablet (10 mg total) by mouth every 6 (six) hours as needed (Nausea or vomiting)., Disp: 30 tablet, Rfl: 1   lidocaine-prilocaine (EMLA) cream, Apply to affected area once (Patient taking differently: Apply 1 application. topically as needed (prior to port being accessed).), Disp: 30 g, Rfl: 3 No current facility-administered medications for this visit.  Facility-Administered Medications Ordered in Other Visits:    sodium chloride flush (NS) 0.9 % injection 10 mL, 10 mL, Intravenous, PRN, Sindy Guadeloupe, MD, 10 mL at 03/09/21 0906   Zoledronic Acid (ZOMETA) IVPB 4 mg, 4 mg, Intravenous, Q28 days, Sindy Guadeloupe, MD, Stopped at 11/21/20 1100  Physical exam:  Vitals:   02/12/22 1318  BP: 115/78  Pulse: 76  Resp: 20  Temp: (!) 96.2 F (35.7 C)  SpO2: 96%  Weight: 214 lb 9.6 oz (97.3 kg)   Physical Exam Constitutional:      General: She is not in acute distress. Cardiovascular:     Rate and Rhythm: Normal rate and regular rhythm.     Heart sounds: Normal heart sounds.  Pulmonary:     Effort: Pulmonary effort is normal.     Breath sounds: Normal breath sounds.  Abdominal:     General: Bowel sounds are normal.     Palpations: Abdomen is soft.  Skin:    General: Skin is warm and dry.   Neurological:     Mental Status: She is alert and oriented to person, place, and time.        Latest Ref Rng & Units 02/12/2022   12:50 PM  CMP  Glucose 70 - 99 mg/dL 132    BUN 6 - 20 mg/dL 9  Creatinine 0.44 - 1.00 mg/dL 0.83    Sodium 135 - 145 mmol/L 136    Potassium 3.5 - 5.1 mmol/L 4.2    Chloride 98 - 111 mmol/L 104    CO2 22 - 32 mmol/L 23    Calcium 8.9 - 10.3 mg/dL 8.3    Total Protein 6.5 - 8.1 g/dL 7.1    Total Bilirubin 0.3 - 1.2 mg/dL 1.0    Alkaline Phos 38 - 126 U/L 76    AST 15 - 41 U/L 18    ALT 0 - 44 U/L 19        Latest Ref Rng & Units 02/12/2022   12:50 PM  CBC  WBC 4.0 - 10.5 K/uL 10.6    Hemoglobin 12.0 - 15.0 g/dL 14.0    Hematocrit 36.0 - 46.0 % 42.2    Platelets 150 - 400 K/uL 338       Assessment and plan- Patient is a 56 y.o. female with metastatic adenocarcinoma of the lung here for on treatment assessment prior to next cycle of maintenance Alimta  Patient has been on maintenance Alimta and Keytruda for her stage IV lung cancer but treatment has been on hold since end of April 2023.  She developed diarrhea and subsequent work-up showed a possible combination of colitis secondary to Keytruda superimposed with C. difficile and Yersinia.  Overall clinically patient is improving although she has small frequent diarrheal episodes and follows up with Dr. Marius Ditch.  She is currently on prednisone 40 mg daily.  Clinically patient is stable and is able to go to work daily as well.  I do not plan to rechallenge her with Keytruda at this time and she will continue to remain off it.  Given her overall clinical stability however I do plan to restart Alimta at this time.  Alimta is generally well-tolerated and usually patients do not have diarrhea secondary to Alimta or significant immunosuppression.  I am hesitant to hold her treatment for stage IV lung cancer for longer than necessary.  If patient has any worsening symptoms on Alimta then I will plan to hold it  until her diarrhea is completely resolved.  Counts are otherwise okay to proceed with Alimta today she will receive her B12 injection today and I will see her back in 3 weeks for the following cycle.  Bone metastases: Zometa currently on hold since her calcium levels are still low at 8.3   Visit Diagnosis 1. Primary malignant neoplasm of right lung metastatic to other site [facility])   2. Encounter for antineoplastic chemotherapy      Dr. Randa Evens, MD, MPH [facility] [phone] 02/14/2022 10:33 AM

## 2022-02-17 ENCOUNTER — Telehealth: Payer: Self-pay | Admitting: Gastroenterology

## 2022-02-17 NOTE — Telephone Encounter (Signed)
PT left message had question

## 2022-02-18 ENCOUNTER — Other Ambulatory Visit: Payer: Self-pay | Admitting: Oncology

## 2022-02-19 ENCOUNTER — Ambulatory Visit: Admission: RE | Admit: 2022-02-19 | Payer: 59 | Source: Ambulatory Visit

## 2022-02-22 ENCOUNTER — Encounter
Admission: RE | Admit: 2022-02-22 | Discharge: 2022-02-22 | Disposition: A | Discharge status: Home / Self Care | Payer: 59 | Source: Ambulatory Visit | Attending: Oncology | Admitting: Oncology

## 2022-02-22 ENCOUNTER — Telehealth: Payer: Self-pay | Admitting: Gastroenterology

## 2022-02-22 DIAGNOSIS — K625 Hemorrhage of anus and rectum: Secondary | ICD-10-CM | POA: Diagnosis not present

## 2022-02-22 DIAGNOSIS — C3491 Malignant neoplasm of unspecified part of right bronchus or lung: Secondary | ICD-10-CM | POA: Insufficient documentation

## 2022-02-22 DIAGNOSIS — K523 Indeterminate colitis: Secondary | ICD-10-CM

## 2022-02-22 DIAGNOSIS — C3490 Malignant neoplasm of unspecified part of unspecified bronchus or lung: Secondary | ICD-10-CM | POA: Diagnosis not present

## 2022-02-22 MED ORDER — TECHNETIUM TC 99M MEDRONATE IV KIT
20.0000 | PACK | Freq: Once | INTRAVENOUS | Status: AC | PRN
  Administered 2022-02-22: 21.24 via INTRAVENOUS

## 2022-02-22 MED ORDER — PREDNISONE 10 MG PO TABS: ORAL_TABLET | ORAL | 0 refills | Status: AC

## 2022-02-22 NOTE — Telephone Encounter (Signed)
Gina Sanchez  I sent in prescription to her pharmacy.  Also, remind patient about the CBC that I have ordered to get it done.  We did send her a message to her via MyChart, looks like she did not read the message about CBC  Thanks RV

## 2022-02-22 NOTE — Telephone Encounter (Signed)
Patient calling for refill on prednisone but the patient states Dr Marius Ditch had her taking 10 MG 4 times daily but I do not see that in her medications list.

## 2022-02-22 NOTE — Addendum Note (Signed)
Addended by: Sherri Sear R on: 02/22/2022 01:04 PM   Modules accepted: Orders

## 2022-02-23 ENCOUNTER — Ambulatory Visit
Admission: RE | Admit: 2022-02-23 | Discharge: 2022-02-23 | Disposition: A | Discharge status: Home / Self Care | Payer: 59 | Source: Ambulatory Visit | Attending: Oncology | Admitting: Oncology

## 2022-02-23 ENCOUNTER — Telehealth: Payer: Self-pay

## 2022-02-23 DIAGNOSIS — C7951 Secondary malignant neoplasm of bone: Secondary | ICD-10-CM | POA: Diagnosis not present

## 2022-02-23 DIAGNOSIS — K7689 Other specified diseases of liver: Secondary | ICD-10-CM | POA: Diagnosis not present

## 2022-02-23 DIAGNOSIS — C3491 Malignant neoplasm of unspecified part of right bronchus or lung: Secondary | ICD-10-CM | POA: Insufficient documentation

## 2022-02-23 DIAGNOSIS — J984 Other disorders of lung: Secondary | ICD-10-CM | POA: Diagnosis not present

## 2022-02-23 DIAGNOSIS — R599 Enlarged lymph nodes, unspecified: Secondary | ICD-10-CM | POA: Diagnosis not present

## 2022-02-23 DIAGNOSIS — I700 Atherosclerosis of aorta: Secondary | ICD-10-CM | POA: Diagnosis not present

## 2022-02-23 LAB — CBC
Hematocrit: 39 % (ref 34.0–46.6)
Hemoglobin: 13 g/dL (ref 11.1–15.9)
MCH: 29.5 pg (ref 26.6–33.0)
MCHC: 33.3 g/dL (ref 31.5–35.7)
MCV: 88 fL (ref 79–97)
Platelets: 214 10*3/uL (ref 150–450)
RBC: 4.41 x10E6/uL (ref 3.77–5.28)
RDW: 13.4 % (ref 11.7–15.4)
WBC: 12.5 10*3/uL — ABNORMAL HIGH (ref 3.4–10.8)

## 2022-02-23 MED ORDER — IOHEXOL 300 MG/ML  SOLN
100.0000 mL | Freq: Once | INTRAMUSCULAR | Status: AC | PRN
  Administered 2022-02-23: 100 mL via INTRAVENOUS

## 2022-02-23 NOTE — Telephone Encounter (Signed)
LVM for patient to call office back to advise on results.  Also sent a mychart message .  Thanks, [place], [place]

## 2022-02-23 NOTE — Telephone Encounter (Signed)
-----   Message from Lin Landsman, MD sent at 02/23/2022  2:50 PM EDT ----- Please inform patient that her hemoglobin is fairly stable.  Please check with her if she is still having rectal bleeding and if she is taking prednisone 60 mg daily  Thank you  Rohini Vanga

## 2022-03-02 ENCOUNTER — Other Ambulatory Visit: Payer: Self-pay | Admitting: *Deleted

## 2022-03-02 DIAGNOSIS — C3491 Malignant neoplasm of unspecified part of right bronchus or lung: Secondary | ICD-10-CM

## 2022-03-03 NOTE — Addendum Note (Signed)
Addended by: Ulyess Blossom L on: 03/03/2022 09:41 AM   Modules accepted: Orders

## 2022-03-05 ENCOUNTER — Inpatient Hospital Stay (HOSPITAL_BASED_OUTPATIENT_CLINIC_OR_DEPARTMENT_OTHER): Payer: 59 | Admitting: Oncology

## 2022-03-05 ENCOUNTER — Inpatient Hospital Stay: Payer: 59

## 2022-03-05 ENCOUNTER — Encounter: Payer: Self-pay | Admitting: Oncology

## 2022-03-05 VITALS — BP 121/81 | HR 80 | Temp 97.6°F | Resp 16 | Wt 210.0 lb

## 2022-03-05 DIAGNOSIS — C3491 Malignant neoplasm of unspecified part of right bronchus or lung: Secondary | ICD-10-CM

## 2022-03-05 DIAGNOSIS — C7970 Secondary malignant neoplasm of unspecified adrenal gland: Secondary | ICD-10-CM | POA: Diagnosis not present

## 2022-03-05 DIAGNOSIS — K219 Gastro-esophageal reflux disease without esophagitis: Secondary | ICD-10-CM | POA: Diagnosis not present

## 2022-03-05 DIAGNOSIS — K921 Melena: Secondary | ICD-10-CM | POA: Diagnosis not present

## 2022-03-05 DIAGNOSIS — Z87891 Personal history of nicotine dependence: Secondary | ICD-10-CM | POA: Diagnosis not present

## 2022-03-05 DIAGNOSIS — I10 Essential (primary) hypertension: Secondary | ICD-10-CM | POA: Diagnosis not present

## 2022-03-05 DIAGNOSIS — A0472 Enterocolitis due to Clostridium difficile, not specified as recurrent: Secondary | ICD-10-CM | POA: Diagnosis not present

## 2022-03-05 DIAGNOSIS — C779 Secondary and unspecified malignant neoplasm of lymph node, unspecified: Secondary | ICD-10-CM | POA: Diagnosis not present

## 2022-03-05 DIAGNOSIS — C7951 Secondary malignant neoplasm of bone: Secondary | ICD-10-CM | POA: Diagnosis not present

## 2022-03-05 DIAGNOSIS — Z5111 Encounter for antineoplastic chemotherapy: Secondary | ICD-10-CM | POA: Diagnosis not present

## 2022-03-05 DIAGNOSIS — E2740 Unspecified adrenocortical insufficiency: Secondary | ICD-10-CM | POA: Diagnosis not present

## 2022-03-05 DIAGNOSIS — Z79899 Other long term (current) drug therapy: Secondary | ICD-10-CM | POA: Diagnosis not present

## 2022-03-05 LAB — GI PROFILE, STOOL, PCR

## 2022-03-05 LAB — CBC WITH DIFFERENTIAL/PLATELET
Abs Immature Granulocytes: 0.08 10*3/uL — ABNORMAL HIGH (ref 0.00–0.07)
Basophils Absolute: 0 10*3/uL (ref 0.0–0.1)
Basophils Relative: 0 %
Eosinophils Absolute: 0 10*3/uL (ref 0.0–0.5)
Eosinophils Relative: 0 %
HCT: 41.3 % (ref 36.0–46.0)
Hemoglobin: 13.5 g/dL (ref 12.0–15.0)
Immature Granulocytes: 1 %
Lymphocytes Relative: 10 %
Lymphs Abs: 1 10*3/uL (ref 0.7–4.0)
MCH: 29.5 pg (ref 26.0–34.0)
MCHC: 32.7 g/dL (ref 30.0–36.0)
MCV: 90.4 fL (ref 80.0–100.0)
Monocytes Absolute: 0.3 10*3/uL (ref 0.1–1.0)
Monocytes Relative: 3 %
Neutro Abs: 8.5 10*3/uL — ABNORMAL HIGH (ref 1.7–7.7)
Neutrophils Relative %: 86 %
Platelets: 345 10*3/uL (ref 150–400)
RBC: 4.57 MIL/uL (ref 3.87–5.11)
RDW: 15.2 % (ref 11.5–15.5)
WBC: 9.9 10*3/uL (ref 4.0–10.5)
nRBC: 0 % (ref 0.0–0.2)

## 2022-03-05 LAB — COMPREHENSIVE METABOLIC PANEL
ALT: 27 U/L (ref 0–44)
AST: 18 U/L (ref 15–41)
Albumin: 3.3 g/dL — ABNORMAL LOW (ref 3.5–5.0)
Alkaline Phosphatase: 67 U/L (ref 38–126)
Anion gap: 8 (ref 5–15)
BUN: 11 mg/dL (ref 6–20)
CO2: 26 mmol/L (ref 22–32)
Calcium: 8.5 mg/dL — ABNORMAL LOW (ref 8.9–10.3)
Chloride: 103 mmol/L (ref 98–111)
Creatinine, Ser: 0.9 mg/dL (ref 0.44–1.00)
GFR, Estimated: >60 mL/min (ref >60)
Glucose, Bld: 153 mg/dL — ABNORMAL HIGH (ref 70–99)
Potassium: 3.8 mmol/L (ref 3.5–5.1)
Sodium: 137 mmol/L (ref 135–145)
Total Bilirubin: 1.1 mg/dL (ref 0.3–1.2)
Total Protein: 6.6 g/dL (ref 6.5–8.1)

## 2022-03-08 ENCOUNTER — Other Ambulatory Visit: Payer: Self-pay | Admitting: Oncology

## 2022-03-09 ENCOUNTER — Telehealth: Payer: Self-pay

## 2022-03-11 ENCOUNTER — Other Ambulatory Visit: Payer: Self-pay

## 2022-03-11 ENCOUNTER — Encounter: Payer: Self-pay | Admitting: Gastroenterology

## 2022-03-11 ENCOUNTER — Ambulatory Visit: Payer: 59 | Admitting: Gastroenterology

## 2022-03-11 VITALS — BP 105/69 | HR 99 | Temp 98.6°F | Ht 66.0 in | Wt 213.2 lb

## 2022-03-11 DIAGNOSIS — K529 Noninfective gastroenteritis and colitis, unspecified: Secondary | ICD-10-CM | POA: Diagnosis not present

## 2022-03-11 DIAGNOSIS — K523 Indeterminate colitis: Secondary | ICD-10-CM

## 2022-03-11 NOTE — Progress Notes (Signed)
Cephas Darby, MD [address]  [place]  [place], [place] [address]  Main: [phone]  Fax: [phone]    Gastroenterology Consultation  Referring Provider:     Remi Haggard, FNP Primary Care Physician:  Remi Haggard, FNP Primary Gastroenterologist:  Dr. Cephas Darby Reason for Consultation: Immune mediated colitis        HPI:   Gina Sanchez is a 56 y.o. female referred by Dr. 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 The patient has a history of metastatic lung cancer with bone, lymph, adrenal metastasis.  Follows with Dr. Janese Banks in oncology.  Commenced on Keytruda on 01/01/2022.  Presented to the emergency room on 01/03/2022 with mucus diarrheal stools going on for a month.  On November 12, 2021 CT chest abdomen pelvis with contrast showed no gross abnormalities of the GI tract except sigmoid diverticulosis.  On 12/23/2021 GI PCR panel and C. difficile quick screen were negative.  Due to some issues with the lab interface the results in epic does not show a positive C. difficile toxin but the faxed result from the labs which arrived much later showed a positive toxin of C. difficile.  Dr. Marius Ditch did a colonoscopy on 01/06/2022 to evaluate further that showed mild to moderate inflammation with altered vascular tree congestion erythema erosions friability and mucus in the rectum sigmoid colon ascending colon and cecum suggesting of pancolitis.  Biopsies were taken.  And showed normal features in the small bowel but moderate acute colitis in the ascending colon, severe colitis in the transverse colon with no abnormalities in the descending colon but moderate to severe colitis in the rectum and sigmoid colon with evidence of chronicity in the entire colon biopsies   Initially was commenced on steroids but after the C. difficile toxin results came back positive the steroids were stopped and commenced on vancomycin 125 mg 4  times daily.  Patient was seen by Dr. Vicente Males on 01/19/2022 and she reported some improvement in her diarrheal symptoms.  Her CRP was found to be elevated at 2 mg/dL.  Today, patient is accompanied by her husband and she reports continued improvement in her symptoms.  She is no longer experiencing rectal bleeding and she reports feeling 50% better.  She used to have bowel movement every hour, down to 5-6 times daily.  Her main concern is rectal pressure and urgency.  She reports her appetite is improving, denies any abdominal bloating.  She denies any fever, chills, nausea.  Her husband also states that she looks a lot better.  Labs from 5/9 revealed normal CBC, CMP, celiac disease panel. Patient is not on prednisone or hydrocortisone  Follow-up visit 02/11/2022 Patient is here for follow-up of colitis.  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  Patient had when she was tapering vancomycin.  Clear fluid underwent repeat stool studies which came back negative for C. difficile toxin A/B, however positive for Yersinia enterocolitica, therefore patient was treated with 5 days course of doxycycline 100 mg patient has self discontinued vancomycin taper when she was taking 2 pills daily.  She states that given that she did not notice much improvement in her symptoms, she decided to stop when she found that she has Yersinia but not C. difficile.  Patient reports that rectal bleeding has resolved, along with bowel urgency.  She continues to have up to 6 pudding-like, nonbloody brown bowel movements daily.  She denies nocturnal diarrhea.  She reports ongoing fatigue.  She denies any weight loss or loss of appetite.  She likes to eat out on a regular basis.  Follow-up visit 03/11/2022 Patient is here to discuss about ongoing diarrhea.  She noticed rectal bleeding after chemotherapy.  Therefore it has been on hold.   She increased prednisone back to 60 mg daily which she did not notice much improvement.  She is no longer experiencing rectal bleeding, however has a severe nonbloody watery diarrhea.  She is currently on prednisone 40 mg daily.  Patient is accompanied by her husband today.  NSAIDs: None  Antiplts/Anticoagulants/Anti thrombotics: None  GI Procedures:  Colonoscopy 01/06/2022 - Segmental moderate inflammation was found in the rectum, in the recto-sigmoid colon, in the sigmoid colon, in the ascending colon and in the cecum secondary to colitis. - The distal rectum and anal verge are normal on retroflexion view. - Diverticulosis in the entire examined colon.  DIAGNOSIS:  A.  TERMINAL ILEUM; BIOPSIES:  - SMALL BOWEL MUCOSA WITH NORMAL VILLOUS ARCHITECTURE AND SCATTERED MILD  LYMPHOID HYPERPLASIA.  - NO EVIDENCE OF SIGNIFICANT ATYPIA.   B.  COLON, ASCENDING; BIOPSIES;  - MODERATE ACUTE COLITIS (SEE COMMENT)   C.  COLON, TRANSVERSE, BIOPSIES;  - FOCAL SEVERE ACUTE COLITIS (SEE COMMENT).   D.  COLON, DESCENDING; BIOPSIES:  COLONIC MUCOSA WITH NO SPECIFIC HISTOLOGIC ABNORMALITY.   E.  COLON, SIGMOID; BIOPSIES:  - COLONIC MUCOSA WITH MODERATE TO SEVERE ACUTE COLITIS (SEE COMMENT).   F.  RECTUM; BIOPSIES:  - DIFFUSE SEVERE ACUTE COLITIS (SEE COMMENT).  Comment: Biopsies from the ascending transverse sigmoid and rectum show  diffuse increased acute and chronic inflammation with numerous areas of  cryptitis and crypt abscess.  There is miniaturization of crypts.  Significant complex architectural branching is not seen.  There is  marked reactive nuclear change but no definite dysplasia. There are  occasional cells with prominent eosinophilic nucleoli. The pattern  present is nonspecific but has features that have been associated with  Keytruda induced colitis (or other medication effects).  An acute  ischemic colitis would also be in the differential.  Past Medical History:  Diagnosis  Date   Abnormal Pap smear of cervix    Anxiety    Depression    Diverticulosis    Essential hypertension    Femur fracture, right (HCC)    GERD (gastroesophageal reflux disease)    Lung cancer (HCC)    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   Palpitations    Precordial chest pain    Wrist fracture 03/2021   left    Past Surgical History:  Procedure Laterality Date   BREAST BIOPSY Right 2017   benign   CERVICAL BIOPSY  W/ LOOP ELECTRODE EXCISION     COLONOSCOPY WITH PROPOFOL N/A 07/03/2015   Procedure: COLONOSCOPY WITH PROPOFOL;  Surgeon: Hulen Luster, MD;  Location: [facility] ENDOSCOPY;  Service: Gastroenterology;  Laterality: N/A;   COLONOSCOPY WITH PROPOFOL N/A 01/06/2022   Procedure: COLONOSCOPY WITH PROPOFOL;  Surgeon: Lin Landsman, MD;  Location: [facility] ENDOSCOPY;  Service: Gastroenterology;  Laterality: N/A;   ESOPHAGOGASTRODUODENOSCOPY (EGD) WITH PROPOFOL N/A 07/03/2015   Procedure: ESOPHAGOGASTRODUODENOSCOPY (EGD) WITH PROPOFOL;  Surgeon: Hulen Luster, MD;  Location: [facility] ENDOSCOPY;  Service: Gastroenterology;  Laterality: N/A;   FRACTURE SURGERY     INTRAMEDULLARY (IM) NAIL INTERTROCHANTERIC Right 09/15/2020   Procedure: INTRAMEDULLARY (IM) NAIL INTERTROCHANTRIC;  Surgeon: Corky Mull, MD;  Location: [facility] ORS;  Service: Orthopedics;  Laterality: Right;   IR IMAGING GUIDED PORT INSERTION  11/28/2020   LEFT HEART CATH AND CORONARY ANGIOGRAPHY N/A 03/28/2017   Procedure: Left Heart Cath and Coronary Angiography;  Surgeon: Lorretta Harp, MD;  Location: [place] CV LAB;  Service: Cardiovascular;  Laterality: N/A;   ORIF WRIST FRACTURE Left 03/31/2021   Procedure: OPEN REDUCTION INTERNAL FIXATION (ORIF) LEFT DISTAL RADIUS FRACTURE.;  Surgeon: Corky Mull, MD;  Location: [facility] ORS;  Service: Orthopedics;  Laterality: Left;   Current Outpatient Medications:    ALPRAZolam (XANAX) 0.5 MG tablet, Take 1 tablet (0.5 mg total) by mouth daily as needed., Disp: 30 tablet, Rfl: 0    famotidine (PEPCID) 20 MG tablet, Take 20 mg by mouth in the morning., Disp: , Rfl:    folic acid (FOLVITE) 1 MG tablet, Take 1 tablet (1 mg total) by mouth daily., Disp: 90 tablet, Rfl: 1   lidocaine-prilocaine (EMLA) cream, Apply to affected area once (Patient taking differently: Apply 1 application  topically as needed (prior to port being accessed).), Disp: 30 g, Rfl: 3   metoprolol tartrate (LOPRESSOR) 25 MG tablet, Take 25 mg by mouth in the morning., Disp: , Rfl:    ondansetron (ZOFRAN) 4 MG tablet, TAKE 1 TABLET BY MOUTH EVERY 8 HOURS AS NEEDED FOR NAUSEA & VOMITING, Disp: 20 tablet, Rfl: 0   oxyCODONE (OXY IR/ROXICODONE) 5 MG immediate release tablet, TAKE 1-2 TABLETS BY MOUTH EVERY 4 HOURS AS NEEDED FOR MODERATE PAIN, Disp: 60 tablet, Rfl: 0   Pembrolizumab (KEYTRUDA IV), Inject into the vein. Every 3 weeks, Disp: , Rfl:    PEMEtrexed Disodium (ALIMTA IV), Inject into the vein. Every 3 weeks, Disp: , Rfl:    predniSONE (DELTASONE) 10 MG tablet, Take 6 tablets (60 mg total) by mouth daily with breakfast for 7 days, THEN 4 tablets (40 mg total) daily with breakfast for 7 days, THEN 3 tablets (30 mg total) daily with breakfast for 7 days, THEN 2 tablets (20 mg total) daily with breakfast for 7 days, THEN 1 tablet (10 mg total) daily with breakfast for 7 days., Disp: 112 tablet, Rfl: 0   prochlorperazine (COMPAZINE) 10 MG tablet, Take 1 tablet (10 mg total) by mouth every 6 (six) hours as needed (Nausea or vomiting)., Disp: 30 tablet, Rfl: 1 No current facility-administered medications for this visit.  Facility-Administered Medications Ordered in Other Visits:    sodium chloride flush (NS) 0.9 % injection 10 mL, 10 mL, Intravenous, PRN, Sindy Guadeloupe, MD, 10 mL at 03/09/21 0906   Zoledronic Acid (ZOMETA) IVPB 4 mg, 4 mg, Intravenous, Q28 days, Sindy Guadeloupe, MD, Stopped at 11/21/20 1100    Family History  Problem Relation Age of Onset   Diabetes Mother        alive @ 55   Goiter Mother     Heart disease Father        died of MI @ 27   Osteoporosis Maternal Grandmother    Colon cancer Maternal Grandfather    Breast cancer Neg Hx    Ovarian cancer Neg Hx      Social History   Tobacco Use   Smoking status: Former    Packs/day: 1.00    Years: 30.00    Total pack years: 30.00    Types: Cigarettes    Quit date: 09/17/2020    Years since quitting: 1.4   Smokeless tobacco: Never  Vaping Use  Vaping Use: Never used  Substance Use Topics   Alcohol use: Not Currently    Alcohol/week: 1.0 standard drink of alcohol    Types: 1 Standard drinks or equivalent per week    Comment: beer occ.   Drug use: No    Allergies as of 03/11/2022 - Review Complete 03/11/2022  Allergen Reaction Noted   Factive [gemifloxacin] Rash 07/03/2015    Review of Systems:    All systems reviewed and negative except where noted in HPI.   Physical Exam:  BP 105/69 (BP Location: Left Arm, Patient Position: Sitting, Cuff Size: Normal)   Pulse 99   Temp 98.6 F (37 C) (Oral)   Ht 5' 6"  (1.676 m)   Wt 213 lb 4 oz (96.7 kg)   LMP 09/15/2018   BMI 34.42 kg/m  Patient's last menstrual period was 09/15/2018.  General:   Alert,  Well-developed, well-nourished, pleasant and cooperative in NAD Head:  Normocephalic and atraumatic. Eyes:  Sclera clear, no icterus.   Conjunctiva pink. Ears:  Normal auditory acuity. Nose:  No deformity, discharge, or lesions. Mouth:  No deformity or lesions,oropharynx pink & moist. Neck:  Supple; no masses or thyromegaly. Lungs:  Respirations even and unlabored.  Clear throughout to auscultation.   No wheezes, crackles, or rhonchi. No acute distress. Heart:  Regular rate and rhythm; no murmurs, clicks, rubs, or gallops. Abdomen:  Normal bowel sounds. Soft, non-tender and non-distended without masses, hepatosplenomegaly or hernias noted.  No guarding or rebound tenderness.   Rectal: Not performed Msk:  Symmetrical without gross deformities. Good, equal movement &  strength bilaterally. Pulses:  Normal pulses noted. Extremities:  No clubbing or edema.  No cyanosis. Neurologic:  Alert and oriented x3;  grossly normal neurologically. Skin:  Intact without significant lesions or rashes. No jaundice. Psych:  Alert and cooperative. Normal mood and affect.  Imaging Studies: Reviewed  Assessment and Plan:   Gina Sanchez is a 56 y.o. 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 difficile infection  Patient likely has combination of immune mediated colitis as well as C. difficile infection.  Patient was treated with oral vancomycin 125 mg 4 times daily for 2 weeks followed by short taper.  Due to worsening of diarrhea, repeat stool studies were performed which were negative for C. difficile toxin A and B, positive for Yersinia enterocolitica.  This was treated with 5 days course of doxycycline 100 mg twice daily.  Overall, patient showed improvement in her symptoms other than ongoing diarrhea.  Patient used to have 10-12 bowel movements daily, currently down to 6 bowel movements daily.  She is no longer experiencing rectal bleeding, urgency, nocturnal diarrhea.  Had to restart her on prednisone due to recurrence of symptoms and elevated CRP.  Repeat GI profile PCR on 03/04/2022 negative. Recommend flexible sigmoidoscopy as her diarrhea and start Entyvio.  I have discussed recent benefits of Entyvio.  Will apply to her insurance [employer] gold negative in 01/2022 Hepatitis B surface antibody reactive, hepatitis B surface antigen nonreactive   Follow up in 3 months or sooner if needed   Cephas Darby, MD

## 2022-03-12 ENCOUNTER — Ambulatory Visit
Admission: RE | Admit: 2022-03-12 | Discharge: 2022-03-12 | Disposition: A | Discharge status: Home / Self Care | Payer: 59 | Attending: Gastroenterology | Admitting: Gastroenterology

## 2022-03-12 ENCOUNTER — Ambulatory Visit: Payer: 59 | Admitting: Certified Registered"

## 2022-03-12 ENCOUNTER — Encounter: Payer: Self-pay | Admitting: Gastroenterology

## 2022-03-12 ENCOUNTER — Telehealth: Payer: Self-pay

## 2022-03-12 ENCOUNTER — Encounter
Admission: RE | Disposition: A | Discharge status: Home / Self Care | Payer: Self-pay | Source: Home / Self Care | Attending: Gastroenterology

## 2022-03-12 DIAGNOSIS — F419 Anxiety disorder, unspecified: Secondary | ICD-10-CM | POA: Diagnosis not present

## 2022-03-12 DIAGNOSIS — K523 Indeterminate colitis: Secondary | ICD-10-CM

## 2022-03-12 DIAGNOSIS — K521 Toxic gastroenteritis and colitis: Secondary | ICD-10-CM | POA: Diagnosis not present

## 2022-03-12 DIAGNOSIS — K219 Gastro-esophageal reflux disease without esophagitis: Secondary | ICD-10-CM | POA: Insufficient documentation

## 2022-03-12 DIAGNOSIS — Z923 Personal history of irradiation: Secondary | ICD-10-CM | POA: Diagnosis not present

## 2022-03-12 DIAGNOSIS — F32A Depression, unspecified: Secondary | ICD-10-CM | POA: Insufficient documentation

## 2022-03-12 DIAGNOSIS — Z9221 Personal history of antineoplastic chemotherapy: Secondary | ICD-10-CM | POA: Diagnosis not present

## 2022-03-12 DIAGNOSIS — Z79899 Other long term (current) drug therapy: Secondary | ICD-10-CM | POA: Diagnosis not present

## 2022-03-12 DIAGNOSIS — Z85830 Personal history of malignant neoplasm of bone: Secondary | ICD-10-CM | POA: Diagnosis not present

## 2022-03-12 DIAGNOSIS — R69 Illness, unspecified: Secondary | ICD-10-CM | POA: Diagnosis not present

## 2022-03-12 DIAGNOSIS — Z85118 Personal history of other malignant neoplasm of bronchus and lung: Secondary | ICD-10-CM | POA: Insufficient documentation

## 2022-03-12 DIAGNOSIS — Z87891 Personal history of nicotine dependence: Secondary | ICD-10-CM | POA: Diagnosis not present

## 2022-03-12 DIAGNOSIS — I10 Essential (primary) hypertension: Secondary | ICD-10-CM | POA: Diagnosis not present

## 2022-03-12 HISTORY — PX: FLEXIBLE SIGMOIDOSCOPY: SHX5431

## 2022-03-12 SURGERY — SIGMOIDOSCOPY, FLEXIBLE
Anesthesia: General

## 2022-03-12 MED ORDER — PROPOFOL 10 MG/ML IV BOLUS
INTRAVENOUS | Status: DC | PRN
  Administered 2022-03-12: 100 mg via INTRAVENOUS

## 2022-03-12 MED ORDER — GLYCOPYRROLATE 0.2 MG/ML IJ SOLN
INTRAMUSCULAR | Status: DC | PRN
  Administered 2022-03-12: .2 mg via INTRAVENOUS

## 2022-03-12 MED ORDER — SODIUM CHLORIDE 0.9 % IV SOLN
INTRAVENOUS | Status: DC
  Administered 2022-03-12: 20 mL/h via INTRAVENOUS

## 2022-03-12 MED ORDER — DEXMEDETOMIDINE (PRECEDEX) IN NS 20 MCG/5ML (4 MCG/ML) IV SYRINGE
PREFILLED_SYRINGE | INTRAVENOUS | Status: DC | PRN
  Administered 2022-03-12: 12 ug via INTRAVENOUS

## 2022-03-12 MED ORDER — LIDOCAINE HCL (CARDIAC) PF 100 MG/5ML IV SOSY
PREFILLED_SYRINGE | INTRAVENOUS | Status: DC | PRN
  Administered 2022-03-12: 50 mg via INTRAVENOUS

## 2022-03-12 MED ORDER — ONDANSETRON HCL 4 MG/2ML IJ SOLN
INTRAMUSCULAR | Status: DC | PRN
  Administered 2022-03-12: 4 mg via INTRAVENOUS

## 2022-03-12 MED ORDER — DEXAMETHASONE SODIUM PHOSPHATE 10 MG/ML IJ SOLN
INTRAMUSCULAR | Status: DC | PRN
  Administered 2022-03-12: 8 mg via INTRAVENOUS

## 2022-03-12 MED ORDER — PROPOFOL 500 MG/50ML IV EMUL
INTRAVENOUS | Status: DC | PRN
  Administered 2022-03-12: 140 ug/kg/min via INTRAVENOUS

## 2022-03-12 NOTE — Op Note (Signed)
[facility] Gastroenterology Patient Name: Mayrene Bastarache Procedure Date: 03/12/2022 12:16 PM MRN: [id number] Account #: [id number] Date of Birth: 07/05/1966 Admit Type: Outpatient Age: 56 Room: [facility] ENDO ROOM 2 Gender: Female Note Status: Finalized Instrument Name: Peds Colonoscope [id number] Procedure:             Flexible Sigmoidoscopy Providers:             Lin Landsman MD, MD Referring MD:          Jordan Likes. Lavena Bullion (Referring MD) Medicines:             General Anesthesia Complications:         No immediate complications. Estimated blood loss:                         Minimal. Procedure:             Pre-Anesthesia Assessment:                        - Prior to the procedure, a History and Physical was                         performed, and patient medications and allergies were                         reviewed. The patient is competent. The risks and                         benefits of the procedure and the sedation options and                         risks were discussed with the patient. All questions                         were answered and informed consent was obtained.                         Patient identification and proposed procedure were                         verified by the physician, the nurse, the                         anesthesiologist, the anesthetist and the technician                         in the pre-procedure area in the procedure room in the                         endoscopy suite. Mental Status Examination: alert and                         oriented. Airway Examination: normal oropharyngeal                         airway and neck mobility. Respiratory Examination:                         clear to auscultation. CV Examination: normal.  Prophylactic Antibiotics: The patient does not require                         prophylactic antibiotics. Prior Anticoagulants: The                         patient has taken no  previous anticoagulant or                         antiplatelet agents. ASA Grade Assessment: III - A                         patient with severe systemic disease. After reviewing                         the risks and benefits, the patient was deemed in                         satisfactory condition to undergo the procedure. The                         anesthesia plan was to use general anesthesia.                         Immediately prior to administration of medications,                         the patient was re-assessed for adequacy to receive                         sedatives. The heart rate, respiratory rate, oxygen                         saturations, blood pressure, adequacy of pulmonary                         ventilation, and response to care were monitored                         throughout the procedure. The physical status of the                         patient was re-assessed after the procedure.                        After obtaining informed consent, the scope was passed                         under direct vision. The Colonoscope was introduced                         through the anus and advanced to the the descending                         colon. The flexible sigmoidoscopy was accomplished                         without difficulty. The patient tolerated the  procedure well. The quality of the bowel preparation                         was adequate. Findings:      The perianal and digital rectal examinations were normal. Pertinent       negatives include normal sphincter tone and no palpable rectal lesions.      Diffuse moderate inflammation characterized by altered vascularity,       congestion (edema), erosions, erythema and friability was found in the       rectum, in the recto-sigmoid colon, in the sigmoid colon and in the       descending colon. Biopsies were taken with a cold forceps for histology.       Estimated blood loss was  minimal. Impression:            - Diffuse moderate inflammation was found in the                         rectum, in the recto-sigmoid colon, in the sigmoid                         colon and in the descending colon secondary to                         colitis. Biopsied. Recommendation:        - Discharge patient to home (with spouse).                        - Resume previous diet today.                        - Await pathology results.                        - Return to my office as previously scheduled.                        - Continue present medications. Procedure Code(s):     --- Professional ---                        [phone], Sigmoidoscopy, flexible; with biopsy, single or                         multiple Diagnosis Code(s):     --- Professional ---                        K52.9, Noninfective gastroenteritis and colitis,                         unspecified CPT copyright 2019 American Medical Association. All rights reserved. The codes documented in this report are preliminary and upon coder review may  be revised to meet current compliance requirements. Dr. Ulyess Mort Lin Landsman MD, MD 03/12/2022 12:44:37 PM This report has been signed electronically. Number of Addenda: 0 Note Initiated On: 03/12/2022 12:16 PM Total Procedure Duration: 0 hours 10 minutes 7 seconds  Estimated Blood Loss:  Estimated blood loss was minimal.      [facility]

## 2022-03-12 NOTE — Telephone Encounter (Signed)
Faxed application form for the Entyvio to [facility]. Sent Gina Sanchez a email to let her know we sent it over

## 2022-03-12 NOTE — Anesthesia Preprocedure Evaluation (Signed)
Anesthesia Evaluation  Patient identified by MRN, date of birth, ID band Patient awake    Reviewed: Allergy & Precautions, NPO status , Patient's Chart, lab work & pertinent test results  Airway Mallampati: II  TM Distance: >3 FB Neck ROM: Full    Dental  (+) Teeth Intact   Pulmonary neg pulmonary ROS, former smoker,  Lung cancer, chemo and radiation   Pulmonary exam normal  + decreased breath sounds      Cardiovascular Exercise Tolerance: Good hypertension, Pt. on medications negative cardio ROS Normal cardiovascular exam Rhythm:Regular     Neuro/Psych Anxiety Depression negative neurological ROS  negative psych ROS   GI/Hepatic negative GI ROS, Neg liver ROS, GERD  ,  Endo/Other  negative endocrine ROS  Renal/GU negative Renal ROS  negative genitourinary   Musculoskeletal negative musculoskeletal ROS (+)   Abdominal (+) + obese,   Peds negative pediatric ROS (+)  Hematology negative hematology ROS (+)   Anesthesia Other Findings Past Medical History: No date: Abnormal Pap smear of cervix No date: Anxiety No date: Depression No date: Diverticulosis No date: Essential hypertension No date: Femur fracture, right (HCC) No date: GERD (gastroesophageal reflux disease) No date: Lung cancer ([place])     Comment:  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 No date: Palpitations No date: Precordial chest pain 03/2021: Wrist fracture     Comment:  left  Past Surgical History: 2017: BREAST BIOPSY; Right     Comment:  benign No date: CERVICAL BIOPSY  W/ LOOP ELECTRODE EXCISION 07/03/2015: COLONOSCOPY WITH PROPOFOL; N/A     Comment:  Procedure: COLONOSCOPY WITH PROPOFOL;  Surgeon: Hulen Luster, MD;  Location: [facility] ENDOSCOPY;  Service:               Gastroenterology;  Laterality: N/A; 01/06/2022: COLONOSCOPY WITH PROPOFOL; N/A     Comment:  Procedure: COLONOSCOPY WITH PROPOFOL;  Surgeon: Lin Landsman, MD;  Location: [facility] ENDOSCOPY;  Service:               Gastroenterology;  Laterality: N/A; 07/03/2015: ESOPHAGOGASTRODUODENOSCOPY (EGD) WITH PROPOFOL; N/A     Comment:  Procedure: ESOPHAGOGASTRODUODENOSCOPY (EGD) WITH               PROPOFOL;  Surgeon: Hulen Luster, MD;  Location: [facility]               ENDOSCOPY;  Service: Gastroenterology;  Laterality: N/A; No date: FRACTURE SURGERY 09/15/2020: INTRAMEDULLARY (IM) NAIL INTERTROCHANTERIC; Right     Comment:  Procedure: INTRAMEDULLARY (IM) NAIL INTERTROCHANTRIC;                Surgeon: Corky Mull, MD;  Location: [facility] ORS;                Service: Orthopedics;  Laterality: Right; 11/28/2020: IR IMAGING GUIDED PORT INSERTION 03/28/2017: LEFT HEART CATH AND CORONARY ANGIOGRAPHY; N/A     Comment:  Procedure: Left Heart Cath and Coronary Angiography;                Surgeon: Lorretta Harp, MD;  Location: [place] CV              LAB;  Service: Cardiovascular;  Laterality: N/A; 03/31/2021: ORIF WRIST FRACTURE; Left     Comment:  Procedure: OPEN REDUCTION INTERNAL FIXATION (ORIF) LEFT  DISTAL RADIUS FRACTURE.;  Surgeon: Corky Mull, MD;                Location: [facility] ORS;  Service: Orthopedics;  Laterality:               Left;  BMI    Body Mass Index: 34.38 kg/m      Reproductive/Obstetrics negative OB ROS                             Anesthesia Physical Anesthesia Plan  ASA: 3  Anesthesia Plan: General   Post-op Pain Management:    Induction: Intravenous  PONV Risk Score and Plan: Propofol infusion and TIVA  Airway Management Planned: Natural Airway  Additional Equipment:   Intra-op Plan:   Post-operative Plan:   Informed Consent: I have reviewed the patients History and Physical, chart, labs and discussed the procedure including the risks, benefits and alternatives for the proposed anesthesia with the patient or authorized representative who has indicated his/her  understanding and acceptance.     Dental Advisory Given  Plan Discussed with: CRNA and Surgeon  Anesthesia Plan Comments:         Anesthesia Quick Evaluation

## 2022-03-12 NOTE — Transfer of Care (Signed)
Immediate Anesthesia Transfer of Care Note  Patient: Gina Sanchez  Procedure(s) Performed: FLEXIBLE SIGMOIDOSCOPY  Patient Location: PACU and Endoscopy Unit  Anesthesia Type:General  Level of Consciousness: drowsy  Airway & Oxygen Therapy: Patient Spontanous Breathing  Post-op Assessment: Report given to RN  Post vital signs: stable  Last Vitals:  Vitals Value Taken Time  BP 104/60 03/12/22 1247  Temp    Pulse 82 03/12/22 1248  Resp 16 03/12/22 1248  SpO2 100 % 03/12/22 1248  Vitals shown include unvalidated device data.  Last Pain:  Vitals:   03/12/22 1048  TempSrc: Temporal  PainSc: 2          Complications: No notable events documented.

## 2022-03-12 NOTE — Anesthesia Postprocedure Evaluation (Signed)
Anesthesia Post Note  Patient: Gina Sanchez  Procedure(s) Performed: [place]  Patient location during evaluation: PACU Anesthesia Type: General Level of consciousness: awake and oriented Pain management: satisfactory to patient Vital Signs Assessment: post-procedure vital signs reviewed and stable Respiratory status: spontaneous breathing and respiratory function stable Cardiovascular status: stable Anesthetic complications: no   No notable events documented.   Last Vitals:  Vitals:   03/12/22 1255 03/12/22 1305  BP: 101/70   Pulse:    Resp:  20  Temp:    SpO2:      Last Pain:  Vitals:   03/12/22 1305  TempSrc:   PainSc: 0-No pain                 VAN STAVEREN,Aneisa Karren

## 2022-03-12 NOTE — H&P (Signed)
Cephas Darby, MD [address]  [place]  [place], [place] [address]  Main: [phone]  Fax: [phone] Pager: [phone]  Primary Care Physician:  Remi Haggard, FNP Primary Gastroenterologist:  Dr. Cephas Darby  Pre-Procedure History & Physical: HPI:  Gina Sanchez is a 56 y.o. female is here for an flexible sigmoidoscopy.   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   Palpitations    Precordial chest pain    Wrist fracture 03/2021   left    Past Surgical History:  Procedure Laterality Date   BREAST BIOPSY Right 2017   benign   CERVICAL BIOPSY  W/ LOOP ELECTRODE EXCISION     COLONOSCOPY WITH PROPOFOL N/A 07/03/2015   Procedure: COLONOSCOPY WITH PROPOFOL;  Surgeon: Hulen Luster, MD;  Location: [facility] ENDOSCOPY;  Service: Gastroenterology;  Laterality: N/A;   COLONOSCOPY WITH PROPOFOL N/A 01/06/2022   Procedure: COLONOSCOPY WITH PROPOFOL;  Surgeon: Lin Landsman, MD;  Location: [facility] ENDOSCOPY;  Service: Gastroenterology;  Laterality: N/A;   ESOPHAGOGASTRODUODENOSCOPY (EGD) WITH PROPOFOL N/A 07/03/2015   Procedure: ESOPHAGOGASTRODUODENOSCOPY (EGD) WITH PROPOFOL;  Surgeon: Hulen Luster, MD;  Location: [facility] ENDOSCOPY;  Service: Gastroenterology;  Laterality: N/A;   FRACTURE SURGERY     INTRAMEDULLARY (IM) NAIL INTERTROCHANTERIC Right 09/15/2020   Procedure: INTRAMEDULLARY (IM) NAIL INTERTROCHANTRIC;  Surgeon: Corky Mull, MD;  Location: [facility] ORS;  Service: Orthopedics;  Laterality: Right;   IR IMAGING GUIDED PORT INSERTION  11/28/2020   LEFT HEART CATH AND CORONARY ANGIOGRAPHY N/A 03/28/2017   Procedure: Left Heart Cath and Coronary Angiography;  Surgeon: Lorretta Harp, MD;  Location: [facility];  Service:  Cardiovascular;  Laterality: N/A;   ORIF WRIST FRACTURE Left 03/31/2021   Procedure: OPEN REDUCTION INTERNAL FIXATION (ORIF) LEFT DISTAL RADIUS FRACTURE.;  Surgeon: Corky Mull, MD;  Location: [facility] ORS;  Service: Orthopedics;  Laterality: Left;    Prior to Admission medications   Medication Sig Start Date End Date Taking? Authorizing Provider  ALPRAZolam Duanne Moron) 0.5 MG tablet Take 1 tablet (0.5 mg total) by mouth daily as needed. 01/22/22  Yes Sindy Guadeloupe, MD  famotidine (PEPCID) 20 MG tablet Take 20 mg by mouth in the morning.   Yes [provider]  folic acid (FOLVITE) 1 MG tablet Take 1 tablet (1 mg total) by mouth daily. 11/04/21  Yes Sindy Guadeloupe, MD  lidocaine-prilocaine (EMLA) cream Apply to affected area once Patient taking differently: Apply 1 application  topically as needed (prior to port being accessed). 10/19/20  Yes Sindy Guadeloupe, MD  metoprolol tartrate (LOPRESSOR) 25 MG tablet Take 25 mg by mouth in the morning. 11/11/20  Yes [provider]  ondansetron (ZOFRAN) 4 MG tablet TAKE 1 TABLET BY MOUTH EVERY 8 HOURS AS NEEDED FOR NAUSEA & VOMITING 02/18/22  Yes Covington, Sarah M, PA-C  oxyCODONE (OXY IR/ROXICODONE) 5 MG immediate release tablet TAKE 1-2 TABLETS BY MOUTH EVERY 4 HOURS AS NEEDED FOR MODERATE PAIN 03/08/22  Yes Sindy Guadeloupe, MD  Pembrolizumab [facility] IV) Inject into the vein. Every 3 weeks   Yes [provider]  PEMEtrexed Disodium (ALIMTA IV) Inject into the vein. Every 3 weeks  Yes [provider]  predniSONE (DELTASONE) 10 MG tablet Take 6 tablets (60 mg total) by mouth daily with breakfast for 7 days, THEN 4 tablets (40 mg total) daily with breakfast for 7 days, THEN 3 tablets (30 mg total) daily with breakfast for 7 days, THEN 2 tablets (20 mg total) daily with breakfast for 7 days, THEN 1 tablet (10 mg total) daily with breakfast for 7 days. 02/22/22 03/29/22 Yes Atwell Mcdanel, Tally Due, MD  prochlorperazine (COMPAZINE) 10 MG tablet  Take 1 tablet (10 mg total) by mouth every 6 (six) hours as needed (Nausea or vomiting). 01/22/22   Sindy Guadeloupe, MD    Allergies as of 03/11/2022 - Review Complete 03/11/2022  Allergen Reaction Noted   Factive [gemifloxacin] Rash 07/03/2015    Family History  Problem Relation Age of Onset   Diabetes Mother        alive @ 23   Goiter Mother    Heart disease Father        died of MI @ 38   Osteoporosis Maternal Grandmother    Colon cancer Maternal Grandfather    Breast cancer Neg Hx    Ovarian cancer Neg Hx     Social History   Socioeconomic History   Marital status: Married    Spouse name: Gene   Number of children: 2   Years of education: Not on file   Highest education level: Not on file  Occupational History   Not on file  Tobacco Use   Smoking status: Former    Packs/day: 1.00    Years: 30.00    Total pack years: 30.00    Types: Cigarettes    Quit date: 09/17/2020    Years since quitting: 1.4   Smokeless tobacco: Never  Vaping Use   Vaping Use: Never used  Substance and Sexual Activity   Alcohol use: Not Currently    Alcohol/week: 1.0 standard drink of alcohol    Types: 1 Standard drinks or equivalent per week    Comment: beer occ.   Drug use: No   Sexual activity: Yes    Birth control/protection: Post-menopausal  Other Topics Concern   Not on file  Social History Narrative   Lives in [place] with her husband.  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   Social Determinants of Health   Financial Resource Strain: Not on file  Food Insecurity: Not on file  Transportation Needs: Not on file  Physical Activity: Not on file  Stress: Not on file  Social Connections: Not on file  Intimate Partner Violence: Not on file    Review of Systems: See HPI, otherwise negative ROS  Physical Exam: BP 103/87   Pulse 78   Temp (!) 96.6 F (35.9 C) (Temporal)   Resp 20   Ht 5' 6"  (1.676 m)   Wt 96.6 kg   LMP  09/15/2018   SpO2 99%   BMI 34.38 kg/m  General:   Alert,  pleasant and cooperative in NAD Head:  Normocephalic and atraumatic. Neck:  Supple; no masses or thyromegaly. Lungs:  Clear throughout to auscultation.    Heart:  Regular rate and rhythm. Abdomen:  Soft, nontender and nondistended. Normal bowel sounds, without guarding, and without rebound.   Neurologic:  Alert and  oriented x4;  grossly normal neurologically.  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  Questions have been answered.  All parties agreeable.   Sherri Sear, MD  03/12/2022, 10:50 AM

## 2022-03-15 LAB — SURGICAL PATHOLOGY

## 2022-03-23 ENCOUNTER — Telehealth: Payer: Self-pay

## 2022-03-23 NOTE — Telephone Encounter (Signed)
Per Sharrie Rothman Ms. Gina Sanchez is approved.  I will update once shes scheduled

## 2022-03-23 NOTE — Telephone Encounter (Signed)
Patient verbalized understanding of results. Made 1 month follow up and she states she will call and make her infusion appointment

## 2022-03-23 NOTE — Telephone Encounter (Signed)
-----   Message from Lin Landsman, MD sent at 03/23/2022  4:25 PM EDT ----- Please inform patient that the pathology results reveal moderately active acute colitis despite being on several weeks of prednisone.  Please let her know that Weyman Rodney has been approved and I recommend to start [facility] ASAP.  Please make a follow-up appointment to see me in 1 month, okay to overbook  PS: Looks like patient is not seeing her MyChart message  RV

## 2022-04-02 DIAGNOSIS — K523 Indeterminate colitis: Secondary | ICD-10-CM | POA: Diagnosis not present

## 2022-04-05 ENCOUNTER — Other Ambulatory Visit: Payer: Self-pay

## 2022-04-12 ENCOUNTER — Telehealth: Payer: Self-pay

## 2022-04-12 ENCOUNTER — Encounter: Payer: Self-pay | Admitting: Gastroenterology

## 2022-04-12 DIAGNOSIS — K523 Indeterminate colitis: Secondary | ICD-10-CM

## 2022-04-12 NOTE — Telephone Encounter (Signed)
-----   Message from Lin Landsman, MD sent at 04/12/2022  4:15 PM EDT ----- Regarding: CRP Let's recheck her CRP  RV

## 2022-04-12 NOTE — Telephone Encounter (Signed)
Order lab and sent mychart message

## 2022-04-13 ENCOUNTER — Telehealth: Payer: Self-pay

## 2022-04-13 DIAGNOSIS — K523 Indeterminate colitis: Secondary | ICD-10-CM | POA: Diagnosis not present

## 2022-04-13 MED ORDER — PREDNISONE 10 MG PO TABS: 40.0000 mg | ORAL_TABLET | Freq: Every day | ORAL | 0 refills | Status: AC

## 2022-04-13 NOTE — Telephone Encounter (Signed)
Please check with her if she is taking prednisone, if she is not taking it, recommend to start prednisone 40 mg daily, give her 1 month supply.  I will follow up on her CRP and other labs that were drawn with infusion  RV

## 2022-04-13 NOTE — Telephone Encounter (Signed)
Patient said she had second infusion yesterday and they did draw blood work on her.  She states she will also come for blood work today also. Patient is wanting to know what she can take for the diarrhea and bleeding. She states she exhausted and wants help. She is asking if she can take any medication to help with her symptoms for the diarrhea.

## 2022-04-13 NOTE — Addendum Note (Signed)
Addended by: Ulyess Blossom L on: 04/13/2022 04:34 PM   Modules accepted: Orders

## 2022-04-13 NOTE — Telephone Encounter (Signed)
Patient verbalized understanding of instructions. She is not on prednisone and called in medication to the pharmacy

## 2022-04-14 ENCOUNTER — Telehealth: Payer: Self-pay

## 2022-04-14 ENCOUNTER — Encounter: Payer: Self-pay | Admitting: Gastroenterology

## 2022-04-14 LAB — C-REACTIVE PROTEIN: CRP: 71 mg/L — ABNORMAL HIGH (ref 0–10)

## 2022-04-14 NOTE — Telephone Encounter (Signed)
-----   Message from Lin Landsman, MD sent at 04/14/2022 11:25 AM EDT ----- CRP is elevated.  Please inform patient that if her symptoms are still not improving within 1 week after taking prednisone, we will get her admitted to the hospital  RV

## 2022-04-14 NOTE — Telephone Encounter (Signed)
Patient verbalized understanding of results. Patient will call me next week to give a update

## 2022-04-15 ENCOUNTER — Other Ambulatory Visit: Payer: Self-pay | Admitting: Oncology

## 2022-04-15 DIAGNOSIS — C3491 Malignant neoplasm of unspecified part of right bronchus or lung: Secondary | ICD-10-CM

## 2022-04-20 ENCOUNTER — Telehealth: Payer: Self-pay

## 2022-04-20 DIAGNOSIS — K523 Indeterminate colitis: Secondary | ICD-10-CM

## 2022-04-20 NOTE — Telephone Encounter (Signed)
Per Sharrie Rothman with [facility] [place] will no longer will be in network with [facility]. They will triage and send orders over to [facility]

## 2022-04-20 NOTE — Telephone Encounter (Signed)
-----   Message from Shelby Mattocks, [place] sent at 04/14/2022 11:31 AM EDT ----- Check on patient

## 2022-04-20 NOTE — Telephone Encounter (Signed)
Patient states she is not feeling as week as she was. She is still having diarrhea in the morning and at night. She states she goes in the morning 3 or 4 times and at night 3 to 4 times at night. She states she is still having some store that is bloody. She has some lower abdominal pain. But she does tries to get it under control before it gets bad.  States she is tired of feeling bad and has not been working because she is so sick

## 2022-04-20 NOTE — Telephone Encounter (Signed)
Let's check CBC, CMP and CRP  RV

## 2022-04-20 NOTE — Addendum Note (Signed)
Addended by: Ulyess Blossom L on: 04/20/2022 03:34 PM   Modules accepted: Orders

## 2022-04-20 NOTE — Telephone Encounter (Signed)
Informed patient of the lab work and she will go get labs tomorrow

## 2022-04-21 ENCOUNTER — Other Ambulatory Visit
Admission: RE | Admit: 2022-04-21 | Discharge: 2022-04-21 | Disposition: A | Discharge status: Home / Self Care | Payer: 59 | Attending: Gastroenterology | Admitting: Gastroenterology

## 2022-04-21 DIAGNOSIS — K523 Indeterminate colitis: Secondary | ICD-10-CM | POA: Insufficient documentation

## 2022-04-21 LAB — COMPREHENSIVE METABOLIC PANEL
ALT: 28 U/L (ref 0–44)
AST: 28 U/L (ref 15–41)
Albumin: 3.3 g/dL — ABNORMAL LOW (ref 3.5–5.0)
Alkaline Phosphatase: 82 U/L (ref 38–126)
Anion gap: 9 (ref 5–15)
BUN: 14 mg/dL (ref 6–20)
CO2: 22 mmol/L (ref 22–32)
Calcium: 8.5 mg/dL — ABNORMAL LOW (ref 8.9–10.3)
Chloride: 100 mmol/L (ref 98–111)
Creatinine, Ser: 1.06 mg/dL — ABNORMAL HIGH (ref 0.44–1.00)
GFR, Estimated: >60 mL/min (ref >60)
Glucose, Bld: 165 mg/dL — ABNORMAL HIGH (ref 70–99)
Potassium: 5.1 mmol/L (ref 3.5–5.1)
Sodium: 131 mmol/L — ABNORMAL LOW (ref 135–145)
Total Bilirubin: 0.9 mg/dL (ref 0.3–1.2)
Total Protein: 6.5 g/dL (ref 6.5–8.1)

## 2022-04-21 LAB — CBC
HCT: 45.4 % (ref 36.0–46.0)
Hemoglobin: 15.1 g/dL — ABNORMAL HIGH (ref 12.0–15.0)
MCH: 27.9 pg (ref 26.0–34.0)
MCHC: 33.3 g/dL (ref 30.0–36.0)
MCV: 83.8 fL (ref 80.0–100.0)
Platelets: 410 10*3/uL — ABNORMAL HIGH (ref 150–400)
RBC: 5.42 MIL/uL — ABNORMAL HIGH (ref 3.87–5.11)
RDW: 13.6 % (ref 11.5–15.5)
WBC: 13.1 10*3/uL — ABNORMAL HIGH (ref 4.0–10.5)
nRBC: 0 % (ref 0.0–0.2)

## 2022-04-21 LAB — C-REACTIVE PROTEIN: CRP: 1.2 mg/dL — ABNORMAL HIGH (ref <1.0)

## 2022-04-22 ENCOUNTER — Other Ambulatory Visit: Payer: Self-pay | Admitting: Gastroenterology

## 2022-04-22 ENCOUNTER — Telehealth (INDEPENDENT_AMBULATORY_CARE_PROVIDER_SITE_OTHER): Payer: Self-pay | Admitting: Gastroenterology

## 2022-04-22 ENCOUNTER — Inpatient Hospital Stay: Payer: 59 | Attending: Oncology

## 2022-04-22 ENCOUNTER — Telehealth: Payer: Self-pay | Admitting: Gastroenterology

## 2022-04-22 DIAGNOSIS — C770 Secondary and unspecified malignant neoplasm of lymph nodes of head, face and neck: Secondary | ICD-10-CM | POA: Insufficient documentation

## 2022-04-22 DIAGNOSIS — K523 Indeterminate colitis: Secondary | ICD-10-CM

## 2022-04-22 DIAGNOSIS — C7970 Secondary malignant neoplasm of unspecified adrenal gland: Secondary | ICD-10-CM | POA: Insufficient documentation

## 2022-04-22 DIAGNOSIS — C3491 Malignant neoplasm of unspecified part of right bronchus or lung: Secondary | ICD-10-CM | POA: Insufficient documentation

## 2022-04-22 DIAGNOSIS — E538 Deficiency of other specified B group vitamins: Secondary | ICD-10-CM | POA: Insufficient documentation

## 2022-04-22 DIAGNOSIS — K529 Noninfective gastroenteritis and colitis, unspecified: Secondary | ICD-10-CM | POA: Insufficient documentation

## 2022-04-22 DIAGNOSIS — C7951 Secondary malignant neoplasm of bone: Secondary | ICD-10-CM | POA: Insufficient documentation

## 2022-04-22 MED ORDER — MESALAMINE 4 G RE ENEM: 4.0000 g | ENEMA | Freq: Every day | RECTAL | 0 refills | Status: DC

## 2022-04-22 NOTE — Telephone Encounter (Signed)
Patient left vm asking about lab results and asking if Dr Marius Ditch wanted her to go to the hospital or not. Requesting a call back from Dr Marius Ditch or her nurse.

## 2022-04-22 NOTE — Telephone Encounter (Addendum)
Called patient to go over the results and her symptoms.  Her CRP has significantly improved from 7 mg/dL 8 days ago to 1.2 mg/dL.  She received second induction dose of Entyvio.  She reports having watery diarrhea, several episodes daily and her main concern is rectal urgency.  She is seeing scant amount of blood per rectum.  Also, reviewed rest of the labs which demonstrate mild leukocytosis, thrombocytosis and elevated hemoglobin, mildly elevated creatinine, serum sodium 131, secondary to dehydration and prednisone use.  She is currently on prednisone 40 mg daily Advised to stay hydrated, consume electrolytes such as Gatorade, chicken broth.  She can try Imodium or Pepto-Bismol, cautioned about black stools on Pepto-Bismol.  Advised on lactose-free diet.  Okay to have yogurt.  Will recheck GI profile PCR to rule out infection and check fecal calprotectin levels.  Will try Rowasa enema nightly for 7 days for rectal urgency  Patient is frustrated that she is not getting any better since her diagnosis 4 months ago and she feels weaker, not able to work and not able to resume her chemotherapy.  I offered her a second opinion to see [facility] IBD specialist and she is agreeable.   She asked me if she should go to [facility] ER if she does not feel well.  I have advised her to go to [facility] or [facility] which are the IBD centers where she could receive better care for management of autoimmune colitis.  Patient expressed understanding of the plan  Cephas Darby, MD [facility], [place]  [address]  [place], [place] [address]  Main: [phone]  Fax: [phone] Pager: [phone]

## 2022-04-22 NOTE — Telephone Encounter (Signed)
Called Ms. Gadsby back after discussing her case with the [facility] IBD specialist, Dr. Domenica Fail.  Advised her to increase prednisone to 60 mg daily for 2 weeks followed by taper weekly by 10 mg.  She agreed to try high-dose prednisone   Caryl Pina  Please put in an urgent referral to Dr. Brayton Layman at [facility] center for a second opinion Dx: Checkpoint inhibitor immune mediated colitis  RV

## 2022-04-22 NOTE — Addendum Note (Signed)
Addended by: Cephas Darby on: 04/22/2022 05:01 PM   Modules accepted: Orders

## 2022-04-26 ENCOUNTER — Telehealth: Payer: Self-pay

## 2022-04-26 NOTE — Telephone Encounter (Signed)
Placed urgent referral to [facility]

## 2022-04-26 NOTE — Telephone Encounter (Signed)
-----   Message from Lin Landsman, MD sent at 04/22/2022  5:54 PM EDT ----- Can you please confirm if her Weyman Rodney maintenance is approved for every 4 weeks  Thanks RV

## 2022-04-26 NOTE — Telephone Encounter (Signed)
Gina Sanchez is only approved for the every 8 weeks on Maintance because was just told to start [facility] and this is the standard does

## 2022-04-26 NOTE — Telephone Encounter (Signed)
Emailed Sharrie Rothman of the change

## 2022-04-28 NOTE — Telephone Encounter (Signed)
Per Clydie Braun took me so long on this one! I wanted to talk to pharmacy first.  She's due for her next dose on 28th and then we will triage it and update to the 4 weeks

## 2022-04-30 ENCOUNTER — Inpatient Hospital Stay: Payer: 59

## 2022-04-30 ENCOUNTER — Other Ambulatory Visit: Payer: Self-pay | Admitting: Oncology

## 2022-04-30 DIAGNOSIS — C7970 Secondary malignant neoplasm of unspecified adrenal gland: Secondary | ICD-10-CM | POA: Diagnosis not present

## 2022-04-30 DIAGNOSIS — C3491 Malignant neoplasm of unspecified part of right bronchus or lung: Secondary | ICD-10-CM | POA: Diagnosis not present

## 2022-04-30 DIAGNOSIS — C770 Secondary and unspecified malignant neoplasm of lymph nodes of head, face and neck: Secondary | ICD-10-CM | POA: Diagnosis not present

## 2022-04-30 DIAGNOSIS — K529 Noninfective gastroenteritis and colitis, unspecified: Secondary | ICD-10-CM | POA: Diagnosis not present

## 2022-04-30 DIAGNOSIS — C7951 Secondary malignant neoplasm of bone: Secondary | ICD-10-CM | POA: Diagnosis not present

## 2022-04-30 DIAGNOSIS — E538 Deficiency of other specified B group vitamins: Secondary | ICD-10-CM | POA: Diagnosis not present

## 2022-04-30 DIAGNOSIS — C3490 Malignant neoplasm of unspecified part of unspecified bronchus or lung: Secondary | ICD-10-CM

## 2022-04-30 MED ORDER — SODIUM CHLORIDE 0.9% FLUSH
10.0000 mL | Freq: Once | INTRAVENOUS | Status: AC
  Administered 2022-04-30: 10 mL via INTRAVENOUS
  Filled 2022-04-30: qty 10

## 2022-04-30 MED ORDER — HEPARIN SOD (PORK) LOCK FLUSH 100 UNIT/ML IV SOLN
500.0000 [IU] | Freq: Once | INTRAVENOUS | Status: AC
  Administered 2022-04-30: 500 [IU] via INTRAVENOUS
  Filled 2022-04-30: qty 5

## 2022-05-04 ENCOUNTER — Inpatient Hospital Stay (HOSPITAL_BASED_OUTPATIENT_CLINIC_OR_DEPARTMENT_OTHER): Payer: 59 | Admitting: Medical Oncology

## 2022-05-04 ENCOUNTER — Inpatient Hospital Stay: Payer: 59

## 2022-05-04 ENCOUNTER — Encounter: Payer: Self-pay | Admitting: Medical Oncology

## 2022-05-04 ENCOUNTER — Other Ambulatory Visit: Payer: Self-pay

## 2022-05-04 VITALS — BP 109/74 | HR 77 | Temp 97.7°F | Resp 17 | Wt 194.2 lb

## 2022-05-04 DIAGNOSIS — R197 Diarrhea, unspecified: Secondary | ICD-10-CM

## 2022-05-04 DIAGNOSIS — C7951 Secondary malignant neoplasm of bone: Secondary | ICD-10-CM | POA: Diagnosis not present

## 2022-05-04 DIAGNOSIS — C3491 Malignant neoplasm of unspecified part of right bronchus or lung: Secondary | ICD-10-CM

## 2022-05-04 DIAGNOSIS — C7970 Secondary malignant neoplasm of unspecified adrenal gland: Secondary | ICD-10-CM | POA: Diagnosis not present

## 2022-05-04 DIAGNOSIS — K529 Noninfective gastroenteritis and colitis, unspecified: Secondary | ICD-10-CM | POA: Diagnosis not present

## 2022-05-04 DIAGNOSIS — C770 Secondary and unspecified malignant neoplasm of lymph nodes of head, face and neck: Secondary | ICD-10-CM | POA: Diagnosis not present

## 2022-05-04 DIAGNOSIS — E876 Hypokalemia: Secondary | ICD-10-CM

## 2022-05-04 DIAGNOSIS — E538 Deficiency of other specified B group vitamins: Secondary | ICD-10-CM | POA: Diagnosis not present

## 2022-05-04 LAB — CBC WITH DIFFERENTIAL/PLATELET
Abs Immature Granulocytes: 0.05 10*3/uL (ref 0.00–0.07)
Basophils Absolute: 0 10*3/uL (ref 0.0–0.1)
Basophils Relative: 0 %
Eosinophils Absolute: 0.1 10*3/uL (ref 0.0–0.5)
Eosinophils Relative: 2 %
HCT: 39.3 % (ref 36.0–46.0)
Hemoglobin: 12.7 g/dL (ref 12.0–15.0)
Immature Granulocytes: 1 %
Lymphocytes Relative: 20 %
Lymphs Abs: 1.6 10*3/uL (ref 0.7–4.0)
MCH: 28.3 pg (ref 26.0–34.0)
MCHC: 32.3 g/dL (ref 30.0–36.0)
MCV: 87.7 fL (ref 80.0–100.0)
Monocytes Absolute: 0.5 10*3/uL (ref 0.1–1.0)
Monocytes Relative: 7 %
Neutro Abs: 5.7 10*3/uL (ref 1.7–7.7)
Neutrophils Relative %: 70 %
Platelets: 244 10*3/uL (ref 150–400)
RBC: 4.48 MIL/uL (ref 3.87–5.11)
RDW: 15 % (ref 11.5–15.5)
WBC: 8.1 10*3/uL (ref 4.0–10.5)
nRBC: 0 % (ref 0.0–0.2)

## 2022-05-04 LAB — COMPREHENSIVE METABOLIC PANEL
ALT: 22 U/L (ref 0–44)
AST: 24 U/L (ref 15–41)
Albumin: 2.7 g/dL — ABNORMAL LOW (ref 3.5–5.0)
Alkaline Phosphatase: 60 U/L (ref 38–126)
Anion gap: 10 (ref 5–15)
BUN: 11 mg/dL (ref 6–20)
CO2: 23 mmol/L (ref 22–32)
Calcium: 7.8 mg/dL — ABNORMAL LOW (ref 8.9–10.3)
Chloride: 104 mmol/L (ref 98–111)
Creatinine, Ser: 0.83 mg/dL (ref 0.44–1.00)
GFR, Estimated: >60 mL/min (ref >60)
Glucose, Bld: 100 mg/dL — ABNORMAL HIGH (ref 70–99)
Potassium: 3.3 mmol/L — ABNORMAL LOW (ref 3.5–5.1)
Sodium: 137 mmol/L (ref 135–145)
Total Bilirubin: 1.1 mg/dL (ref 0.3–1.2)
Total Protein: 5.3 g/dL — ABNORMAL LOW (ref 6.5–8.1)

## 2022-05-04 MED ORDER — HEPARIN SOD (PORK) LOCK FLUSH 100 UNIT/ML IV SOLN
INTRAVENOUS | Status: AC
  Administered 2022-05-04: 500 [IU] via INTRAVENOUS
  Filled 2022-05-04: qty 5

## 2022-05-04 MED ORDER — SODIUM CHLORIDE 0.9 % IV SOLN
INTRAVENOUS | Status: DC
  Filled 2022-05-04: qty 250

## 2022-05-04 MED ORDER — POTASSIUM CHLORIDE 20 MEQ/100ML IV SOLN: 20.0000 meq | Freq: Once | INTRAVENOUS | Status: DC

## 2022-05-04 MED ORDER — ALTEPLASE 2 MG IJ SOLR
2.0000 mg | Freq: Once | INTRAMUSCULAR | Status: AC | PRN
  Administered 2022-05-04: 2 mg
  Filled 2022-05-04: qty 2

## 2022-05-04 MED ORDER — SODIUM CHLORIDE 0.9 % IV SOLN
INTRAVENOUS | Status: AC
  Filled 2022-05-04 (×2): qty 250

## 2022-05-04 MED ORDER — HEPARIN SOD (PORK) LOCK FLUSH 100 UNIT/ML IV SOLN
500.0000 [IU] | Freq: Once | INTRAVENOUS | Status: AC
  Filled 2022-05-04: qty 5

## 2022-05-04 MED ORDER — FOLIC ACID 1 MG PO TABS: 1.0000 mg | ORAL_TABLET | Freq: Every day | ORAL | 1 refills | Status: DC

## 2022-05-04 MED ORDER — SODIUM CHLORIDE 0.9 % IV SOLN
20.0000 meq | Freq: Once | INTRAVENOUS | Status: AC
  Administered 2022-05-04: 20 meq via INTRAVENOUS
  Filled 2022-05-04: qty 10

## 2022-05-04 MED ORDER — CYANOCOBALAMIN 1000 MCG/ML IJ SOLN
1000.0000 ug | INTRAMUSCULAR | Status: DC
  Administered 2022-05-04: 1000 ug via INTRAMUSCULAR
  Filled 2022-05-04: qty 1

## 2022-05-04 NOTE — Progress Notes (Signed)
Blood return noted from port after cathflo was given today. Will also receive vitamin B12 injection today, per Nelwyn Salisbury, PA.

## 2022-05-04 NOTE — Progress Notes (Signed)
Patient here for oncology follow-up appointment,  concerns  of chronic diarrhea and fatigue

## 2022-05-04 NOTE — Progress Notes (Signed)
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  She underwent operative fixation on 10/12/2020.  MRI of femur showed heterogeneously enhancing osseous lesion within the area which would be nonspecific versus office neoplasm or metastatic lesion.  CT chest abdomen and pelvis with contrast showed an enlarged pretracheal lymph node 2.2 x 1.5 cm and a right paratracheal lymph node measuring 2.7 x 1.3 cm.  Prevascular node measuring 0.3 x 0.9 cm.  5 x 4 mm right middle lobe nodule.  2.8 x 1.9 cm left adrenal lesion.    Reamings from the right femur showed metastatic poorly differentiated carcinoma.  Immunohistochemistry showed was positive for pancytokeratin, CK7 and patchy CK20 with patchy dim expression of TTF-1.  Cells negative for Melan-A, CDX2, PAX8, Napsin A, GATA3, p40, CD56, p16 and thyroglobulin.  Findings compatible with metastatic carcinoma but because of decalcification immunohistochemical staining is unreliable.  Patchy dim staining with TTF-1 suspicious for lung primary but not a definitive diagnosis.   Repeat supraclavicular lymph node biopsy showed metastatic adenocarcinoma.   Tumor cells positive for CK7 with focal weak staining for TTF-1.  Suggestive of lung origin in the proper clinical context.  Cells were negative for GATA3 PAX8 CDX2 and CK20 and Napsin A.  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  She is currently on hydrocortisone twice daily.  Thyroid functions presently are normal    Interval history- Treatments have been on hold since June 2023 due to diarrhea. Has had extensive work up including multiple stool studies. She has been on prednisone which she does not feel has been helping her symptoms much. Goes to see a separate specialist Thursday at [facility] for this. She reports just under 10 soft loose stools per day. Normal in color with occasional tinges of blood which she reports are secondary from hemorrhoids due to the amount of stoolings she is having. No fevers, vomiting. Some occasional abdominal cramping for which tylenol helps.   ECOG PS- 1 Pain scale- 0   Review of systems- Review of Systems  Constitutional:  Positive for malaise/fatigue. Negative for chills, fever and weight loss.  HENT:  Negative for congestion, ear discharge and nosebleeds.   Eyes:  Negative for blurred vision.  Respiratory:  Negative for cough, hemoptysis, sputum production, shortness of breath and wheezing.   Cardiovascular:  Negative for chest pain, palpitations, orthopnea and claudication.  Gastrointestinal:  Positive for diarrhea. Negative for abdominal pain, blood in stool, constipation, heartburn, melena, nausea and vomiting.  Genitourinary:  Negative for dysuria, flank pain, frequency, hematuria and urgency.  Musculoskeletal:  Negative for back pain, joint pain and myalgias.  Skin:  Negative for rash.  Neurological:  Negative for dizziness, tingling, focal weakness, seizures, weakness and headaches.  Endo/Heme/Allergies:  Does not  bruise/bleed easily.  Psychiatric/Behavioral:  Negative for depression and suicidal ideas. The patient does not have insomnia.       Allergies  Allergen Reactions   Factive [Gemifloxacin] Rash     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   Palpitations    Precordial chest pain    Wrist fracture 03/2021   left     Past Surgical History:  Procedure Laterality Date   BREAST BIOPSY Right 2017   benign   CERVICAL BIOPSY  W/ LOOP ELECTRODE EXCISION     COLONOSCOPY WITH PROPOFOL N/A 07/03/2015   Procedure: COLONOSCOPY WITH PROPOFOL;  Surgeon: Hulen Luster, MD;  Location: [facility] ENDOSCOPY;  Service: Gastroenterology;  Laterality: N/A;   COLONOSCOPY WITH PROPOFOL N/A 01/06/2022   Procedure: COLONOSCOPY WITH PROPOFOL;  Surgeon: Lin Landsman, MD;  Location: [facility] ENDOSCOPY;  Service: Gastroenterology;  Laterality: N/A;   ESOPHAGOGASTRODUODENOSCOPY (EGD) WITH PROPOFOL N/A 07/03/2015   Procedure: ESOPHAGOGASTRODUODENOSCOPY (EGD) WITH PROPOFOL;  Surgeon: Hulen Luster, MD;  Location: [facility] ENDOSCOPY;  Service: Gastroenterology;  Laterality: N/A;   FLEXIBLE SIGMOIDOSCOPY N/A 03/12/2022   Procedure: FLEXIBLE SIGMOIDOSCOPY;  Surgeon: Lin Landsman, MD;  Location: [facility] ENDOSCOPY;  Service: Gastroenterology;  Laterality: N/A;   FRACTURE SURGERY     INTRAMEDULLARY (IM) NAIL INTERTROCHANTERIC Right 09/15/2020   Procedure: INTRAMEDULLARY (IM) NAIL INTERTROCHANTRIC;  Surgeon: Corky Mull, MD;  Location: [facility] ORS;  Service: Orthopedics;  Laterality: Right;   IR IMAGING GUIDED PORT INSERTION  11/28/2020   LEFT HEART CATH AND CORONARY ANGIOGRAPHY N/A 03/28/2017   Procedure: Left Heart Cath and Coronary Angiography;  Surgeon: Lorretta Harp, MD;  Location: [place] CV LAB;  Service: Cardiovascular;  Laterality: N/A;   ORIF WRIST FRACTURE Left 03/31/2021   Procedure: OPEN REDUCTION INTERNAL FIXATION (ORIF) LEFT DISTAL RADIUS FRACTURE.;  Surgeon: Corky Mull, MD;  Location: [facility] ORS;  Service: Orthopedics;  Laterality: Left;    Social History   Socioeconomic History   Marital status: Married    Spouse name: Gene   Number of children: 2   Years of education: Not on file   Highest education level: Not on file  Occupational History   Not on file  Tobacco Use   Smoking status: Former    Packs/day: 1.00    Years: 30.00    Total pack years: 30.00    Types: Cigarettes    Quit date: 09/17/2020    Years since quitting: 1.6   Smokeless tobacco: Never  Vaping Use   Vaping Use: Never used  Substance and Sexual Activity   Alcohol use: Not Currently    Alcohol/week: 1.0 standard drink of alcohol    Types: 1 Standard drinks or equivalent per week    Comment: beer occ.   Drug use: No   Sexual activity: Yes    Birth control/protection: Post-menopausal  Other Topics Concern   Not on file  Social History Narrative   Lives in [place] with her husband.  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   Social Determinants of Health  Financial Resource Strain: Not on file  Food Insecurity: Not on file  Transportation Needs: Not on file  Physical Activity: Not on file  Stress: Not on file  Social Connections: Not on file  Intimate Partner Violence: Not on file    Family History  Problem Relation Age of Onset   Diabetes Mother        alive @ 58   Goiter Mother    Heart disease Father        died of MI @ 31   Osteoporosis Maternal Grandmother    Colon cancer Maternal Grandfather    Breast cancer Neg Hx    Ovarian cancer Neg Hx      Current Outpatient Medications:    ALPRAZolam (XANAX) 0.5 MG tablet, TAKE 1 TABLET BY MOUTH DAILY AS NEEDED., Disp: 30 tablet, Rfl: 0   famotidine (PEPCID) 20 MG  tablet, Take 20 mg by mouth in the morning., Disp: , Rfl:    folic acid (FOLVITE) 1 MG tablet, Take 1 tablet (1 mg total) by mouth daily., Disp: 90 tablet, Rfl: 1   lidocaine-prilocaine (EMLA) cream, Apply to affected area once (Patient taking differently: Apply 1 application  topically as needed (prior to port being accessed).), Disp: 30 g, Rfl: 3   mesalamine (ROWASA) 4 g enema, Place 60 mLs (4 g total) rectally at bedtime for 7 doses., Disp: 420 mL, Rfl: 0   metoprolol tartrate (LOPRESSOR) 25 MG tablet, Take 25 mg by mouth in the morning., Disp: , Rfl:    ondansetron (ZOFRAN) 4 MG tablet, TAKE 1 TABLET BY MOUTH EVERY 8 HOURS AS NEEDED FOR NAUSEA AND VOMITING, Disp: 20 tablet, Rfl: 0   oxyCODONE (OXY IR/ROXICODONE) 5 MG immediate release tablet, TAKE 1-2 TABLETS BY MOUTH EVERY 4 HOURS AS NEEDED FOR MODERATE PAIN, Disp: 60 tablet, Rfl: 0   Pembrolizumab (KEYTRUDA IV), Inject into the vein. Every 3 weeks, Disp: , Rfl:    PEMEtrexed Disodium (ALIMTA IV), Inject into the vein. Every 3 weeks, Disp: , Rfl:    predniSONE (DELTASONE) 10 MG tablet, Take 4 tablets (40 mg total) by mouth daily with breakfast., Disp: 120 tablet, Rfl: 0   prochlorperazine (COMPAZINE) 10 MG tablet, TAKE 1 TABLET BY MOUTH EVERY 6 HOURS AS NEEDED FOR NAUSEA OR VOMITING, Disp: 30 tablet, Rfl: 1 No current facility-administered medications for this visit.  Facility-Administered Medications Ordered in Other Visits:    0.9 %  sodium chloride infusion, , Intravenous, Continuous, Sindy Guadeloupe, MD, Last Rate: 10 mL/hr at 05/04/22 1207, New Bag at 05/04/22 1207   0.9 %  sodium chloride infusion, , Intravenous, Continuous, Hughie Closs, PA-C, Last Rate: 999 mL/hr at 05/04/22 1214, New Bag at 05/04/22 1214   potassium chloride 20 mEq in sodium chloride 0.9 % 260 mL (0.0769 mEq/mL) infusion - Peripheral Line, 20 mEq, Intravenous, Once, Borders, Fidelity R, NP, Last Rate: 130 mL/hr at 05/04/22 1208, 20 mEq at 05/04/22 1208   sodium  chloride flush (NS) 0.9 % injection 10 mL, 10 mL, Intravenous, PRN, Sindy Guadeloupe, MD, 10 mL at 03/09/21 0906   Zoledronic Acid (ZOMETA) IVPB 4 mg, 4 mg, Intravenous, Q28 days, Sindy Guadeloupe, MD, Stopped at 11/21/20 1100  Physical exam:  Vitals:   05/04/22 1102  BP: 109/74  Pulse: 77  Resp: 17  Temp: 97.7 F (36.5 C)  TempSrc: Oral  SpO2: 95%  Weight: 194 lb 3.2 oz (88.1 kg)   Physical Exam Constitutional:      General: She  is not in acute distress. Cardiovascular:     Rate and Rhythm: Normal rate and regular rhythm.     Heart sounds: Normal heart sounds.  Pulmonary:     Effort: Pulmonary effort is normal.     Breath sounds: Normal breath sounds.  Abdominal:     General: Bowel sounds are normal.     Palpations: Abdomen is soft.  Skin:    General: Skin is warm and dry.  Neurological:     Mental Status: She is alert and oriented to person, place, and time.         Latest Ref Rng & Units 05/04/2022   10:34 AM  CMP  Glucose 70 - 99 mg/dL 100   BUN 6 - 20 mg/dL 11   Creatinine 0.44 - 1.00 mg/dL 0.83   Sodium 135 - 145 mmol/L 137   Potassium 3.5 - 5.1 mmol/L 3.3   Chloride 98 - 111 mmol/L 104   CO2 22 - 32 mmol/L 23   Calcium 8.9 - 10.3 mg/dL 7.8   Total Protein 6.5 - 8.1 g/dL 5.3   Total Bilirubin 0.3 - 1.2 mg/dL 1.1   Alkaline Phos 38 - 126 U/L 60   AST 15 - 41 U/L 24   ALT 0 - 44 U/L 22       Latest Ref Rng & Units 05/04/2022   10:34 AM  CBC  WBC 4.0 - 10.5 K/uL 8.1   Hemoglobin 12.0 - 15.0 g/dL 12.7   Hematocrit 36.0 - 46.0 % 39.3   Platelets 150 - 400 K/uL 244     No images are attached to the encounter.  No results found.   Assessment and plan- Patient is a 56 y.o. female with metastatic adenocarcinoma of the lung on single agent Alimta here to discuss CT scan results and further management  Her Diarrhea has only mildly improved on prednisone and cessation of treatment. Not worse and without fevers or significant pain. She should continue her  follow up with the [facility] GI specialist that is already planned for Thursday. Since she has not tried probiotics I feel that this is a small adjustment that she can make to see if it can form her stools some before her appointment with GI on Thursday. She can continue her imodium which we discussed. Declines additional stool testing at this time. Discussed red flag signs and symptoms.    Visit Diagnosis 1. Primary malignant neoplasm of right lung metastatic to other site [facility])   2. Colitis      Minna Antis [facility] [phone] 05/04/2022 1:23 PM

## 2022-05-05 ENCOUNTER — Ambulatory Visit: Payer: 59

## 2022-05-05 ENCOUNTER — Other Ambulatory Visit: Payer: 59

## 2022-05-05 ENCOUNTER — Encounter: Payer: 59 | Admitting: Hospice and Palliative Medicine

## 2022-05-07 ENCOUNTER — Other Ambulatory Visit: Payer: Self-pay

## 2022-05-07 ENCOUNTER — Observation Stay
Admission: EM | Admit: 2022-05-07 | Discharge: 2022-05-08 | Disposition: A | Discharge status: Home / Self Care | Payer: 59 | Attending: Internal Medicine | Admitting: Internal Medicine

## 2022-05-07 DIAGNOSIS — I10 Essential (primary) hypertension: Secondary | ICD-10-CM | POA: Insufficient documentation

## 2022-05-07 DIAGNOSIS — Z20822 Contact with and (suspected) exposure to covid-19: Secondary | ICD-10-CM | POA: Diagnosis not present

## 2022-05-07 DIAGNOSIS — A0472 Enterocolitis due to Clostridium difficile, not specified as recurrent: Secondary | ICD-10-CM

## 2022-05-07 DIAGNOSIS — Z87891 Personal history of nicotine dependence: Secondary | ICD-10-CM | POA: Insufficient documentation

## 2022-05-07 DIAGNOSIS — C779 Secondary and unspecified malignant neoplasm of lymph node, unspecified: Secondary | ICD-10-CM | POA: Insufficient documentation

## 2022-05-07 DIAGNOSIS — Z720 Tobacco use: Secondary | ICD-10-CM | POA: Diagnosis present

## 2022-05-07 DIAGNOSIS — C3490 Malignant neoplasm of unspecified part of unspecified bronchus or lung: Secondary | ICD-10-CM | POA: Insufficient documentation

## 2022-05-07 DIAGNOSIS — R197 Diarrhea, unspecified: Secondary | ICD-10-CM | POA: Diagnosis not present

## 2022-05-07 DIAGNOSIS — C7970 Secondary malignant neoplasm of unspecified adrenal gland: Secondary | ICD-10-CM | POA: Diagnosis not present

## 2022-05-07 DIAGNOSIS — Z79899 Other long term (current) drug therapy: Secondary | ICD-10-CM | POA: Diagnosis not present

## 2022-05-07 DIAGNOSIS — C7800 Secondary malignant neoplasm of unspecified lung: Secondary | ICD-10-CM | POA: Diagnosis present

## 2022-05-07 DIAGNOSIS — F411 Generalized anxiety disorder: Secondary | ICD-10-CM | POA: Diagnosis present

## 2022-05-07 DIAGNOSIS — E785 Hyperlipidemia, unspecified: Secondary | ICD-10-CM | POA: Diagnosis present

## 2022-05-07 DIAGNOSIS — C7951 Secondary malignant neoplasm of bone: Secondary | ICD-10-CM | POA: Diagnosis not present

## 2022-05-07 DIAGNOSIS — K529 Noninfective gastroenteritis and colitis, unspecified: Secondary | ICD-10-CM

## 2022-05-07 HISTORY — DX: Enterocolitis due to Clostridium difficile, not specified as recurrent: A04.72

## 2022-05-07 LAB — COMPREHENSIVE METABOLIC PANEL
ALT: 23 U/L (ref 0–44)
AST: 21 U/L (ref 15–41)
Albumin: 2.6 g/dL — ABNORMAL LOW (ref 3.5–5.0)
Alkaline Phosphatase: 68 U/L (ref 38–126)
Anion gap: 9 (ref 5–15)
BUN: 9 mg/dL (ref 6–20)
CO2: 24 mmol/L (ref 22–32)
Calcium: 8.1 mg/dL — ABNORMAL LOW (ref 8.9–10.3)
Chloride: 103 mmol/L (ref 98–111)
Creatinine, Ser: 0.64 mg/dL (ref 0.44–1.00)
GFR, Estimated: >60 mL/min (ref >60)
Glucose, Bld: 180 mg/dL — ABNORMAL HIGH (ref 70–99)
Potassium: 3.8 mmol/L (ref 3.5–5.1)
Sodium: 136 mmol/L (ref 135–145)
Total Bilirubin: 0.7 mg/dL (ref 0.3–1.2)
Total Protein: 5.6 g/dL — ABNORMAL LOW (ref 6.5–8.1)

## 2022-05-07 LAB — GASTROINTESTINAL PANEL BY PCR, STOOL (REPLACES STOOL CULTURE)

## 2022-05-07 LAB — CBC WITH DIFFERENTIAL/PLATELET
Abs Immature Granulocytes: 0.05 10*3/uL (ref 0.00–0.07)
Basophils Absolute: 0.1 10*3/uL (ref 0.0–0.1)
Basophils Relative: 1 %
Eosinophils Absolute: 0 10*3/uL (ref 0.0–0.5)
Eosinophils Relative: 0 %
HCT: 41.9 % (ref 36.0–46.0)
Hemoglobin: 13.4 g/dL (ref 12.0–15.0)
Immature Granulocytes: 1 %
Lymphocytes Relative: 25 %
Lymphs Abs: 1.4 10*3/uL (ref 0.7–4.0)
MCH: 27.3 pg (ref 26.0–34.0)
MCHC: 32 g/dL (ref 30.0–36.0)
MCV: 85.5 fL (ref 80.0–100.0)
Monocytes Absolute: 0.4 10*3/uL (ref 0.1–1.0)
Monocytes Relative: 7 %
Neutro Abs: 3.6 10*3/uL (ref 1.7–7.7)
Neutrophils Relative %: 66 %
Platelets: 290 10*3/uL (ref 150–400)
RBC: 4.9 MIL/uL (ref 3.87–5.11)
RDW: 14.8 % (ref 11.5–15.5)
Smear Review: NORMAL
WBC: 5.4 10*3/uL (ref 4.0–10.5)
nRBC: 0 % (ref 0.0–0.2)

## 2022-05-07 LAB — C DIFFICILE QUICK SCREEN W PCR REFLEX
C Diff antigen: POSITIVE — AB
C Diff interpretation: DETECTED
C Diff toxin: POSITIVE — AB

## 2022-05-07 LAB — SARS CORONAVIRUS 2 BY RT PCR: SARS Coronavirus 2 by RT PCR: NEGATIVE

## 2022-05-07 MED ORDER — SODIUM CHLORIDE 0.9 % IV BOLUS
1000.0000 mL | Freq: Once | INTRAVENOUS | Status: AC
  Administered 2022-05-07: 1000 mL via INTRAVENOUS

## 2022-05-07 MED ORDER — KETOROLAC TROMETHAMINE 30 MG/ML IJ SOLN
30.0000 mg | Freq: Once | INTRAMUSCULAR | Status: AC
  Administered 2022-05-07: 30 mg via INTRAVENOUS
  Filled 2022-05-07: qty 1

## 2022-05-07 MED ORDER — OXYCODONE HCL 5 MG PO TABS
5.0000 mg | ORAL_TABLET | ORAL | Status: DC | PRN
Start: 2022-05-07 — End: 2022-05-07

## 2022-05-07 MED ORDER — METOPROLOL TARTRATE 25 MG PO TABS
25.0000 mg | ORAL_TABLET | Freq: Two times a day (BID) | ORAL | Status: DC
  Administered 2022-05-08: 25 mg via ORAL
  Filled 2022-05-07: qty 1

## 2022-05-07 MED ORDER — GUAIFENESIN 100 MG/5ML PO LIQD
5.0000 mL | Freq: Once | ORAL | Status: DC
  Filled 2022-05-07: qty 10

## 2022-05-07 MED ORDER — FIDAXOMICIN 200 MG PO TABS
200.0000 mg | ORAL_TABLET | Freq: Two times a day (BID) | ORAL | Status: DC
  Administered 2022-05-07 – 2022-05-08 (×2): 200 mg via ORAL
  Filled 2022-05-07 (×2): qty 1

## 2022-05-07 MED ORDER — ONDANSETRON HCL 4 MG/2ML IJ SOLN: 4.0000 mg | Freq: Four times a day (QID) | INTRAMUSCULAR | Status: DC | PRN

## 2022-05-07 MED ORDER — ACETAMINOPHEN 325 MG PO TABS
650.0000 mg | ORAL_TABLET | Freq: Four times a day (QID) | ORAL | Status: DC | PRN
  Administered 2022-05-08: 650 mg via ORAL
  Filled 2022-05-07: qty 2

## 2022-05-07 MED ORDER — ALPRAZOLAM 0.5 MG PO TABS
0.5000 mg | ORAL_TABLET | Freq: Every day | ORAL | Status: DC | PRN
  Administered 2022-05-07: 0.5 mg via ORAL
  Filled 2022-05-07: qty 1

## 2022-05-07 MED ORDER — METHYLPREDNISOLONE SODIUM SUCC 125 MG IJ SOLR
60.0000 mg | Freq: Every day | INTRAMUSCULAR | Status: AC
  Administered 2022-05-08: 60 mg via INTRAVENOUS
  Filled 2022-05-07: qty 2

## 2022-05-07 MED ORDER — PREDNISONE 20 MG PO TABS: 20.0000 mg | ORAL_TABLET | Freq: Once | ORAL | Status: DC

## 2022-05-07 MED ORDER — ONDANSETRON HCL 4 MG PO TABS: 4.0000 mg | ORAL_TABLET | Freq: Four times a day (QID) | ORAL | Status: DC | PRN

## 2022-05-07 MED ORDER — ACETAMINOPHEN 650 MG RE SUPP: 650.0000 mg | Freq: Four times a day (QID) | RECTAL | Status: DC | PRN

## 2022-05-07 MED ORDER — OXYCODONE HCL 5 MG PO TABS
5.0000 mg | ORAL_TABLET | ORAL | Status: DC | PRN
  Administered 2022-05-07: 5 mg via ORAL
  Filled 2022-05-07: qty 1

## 2022-05-07 MED ORDER — FOLIC ACID 1 MG PO TABS
1.0000 mg | ORAL_TABLET | Freq: Every day | ORAL | Status: DC
  Administered 2022-05-08: 1 mg via ORAL
  Filled 2022-05-07: qty 1

## 2022-05-07 MED ORDER — ENOXAPARIN SODIUM 40 MG/0.4ML IJ SOSY
40.0000 mg | PREFILLED_SYRINGE | Freq: Every day | INTRAMUSCULAR | Status: DC
  Administered 2022-05-07: 40 mg via SUBCUTANEOUS
  Filled 2022-05-07: qty 0.4

## 2022-05-07 MED ORDER — FAMOTIDINE 20 MG PO TABS
20.0000 mg | ORAL_TABLET | Freq: Every morning | ORAL | Status: DC
  Administered 2022-05-08: 20 mg via ORAL
  Filled 2022-05-07: qty 1

## 2022-05-07 MED ORDER — LIDOCAINE-PRILOCAINE 2.5-2.5 % EX CREA
1.0000 | TOPICAL_CREAM | CUTANEOUS | Status: DC | PRN
Start: 2022-05-07 — End: 2022-05-08

## 2022-05-07 MED ORDER — PREDNISONE 20 MG PO TABS
60.0000 mg | ORAL_TABLET | Freq: Once | ORAL | Status: DC
  Filled 2022-05-07: qty 3

## 2022-05-07 NOTE — Assessment & Plan Note (Signed)
-   Alprazolam 0.5 mg p.o. daily as needed for anxiety ordered

## 2022-05-07 NOTE — H&P (Signed)
History and Physical   Gina [facility] WER:[id number] DOB: Jan 20, 1966 DOA: 05/07/2022  PCP: Gina Haggard, FNP  Outpatient Specialists: Dr. Marius Sanchez, GI Patient coming from: Home  I have personally briefly reviewed patient's old medical records in [place].  Chief Concern: Diarrhea  HPI: Ms. 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 Marius [facility] outpatient and [facility], who presents emergency department for chief concerns of diarrhea failing outpatient therapy.  Initial vitals in the emergency department showed temperature of 98.2, respiration rate of 17, heart rate of 84, blood pressure 100/63, SPO2 96% on room air.  Serum sodium is 136, potassium 3 and 8, chloride 103, bicarb 24, BUN of 9, serum creatinine of 0.64, GFR greater than 60, nonfasting blood glucose 180.  CBC with differential was unremarkable.  ED treatment: Prednisone 60 mg p.o. one-time dose have been ordered, ketorolac 30 mg IV, oxycodone 5 mg p.o.  At bedside, she is able to tell me her name, age, current calendar.  She was able to identify her husband and friend/boss at bedside.  She presents at the recommendation of GI provider for chief concerns of diarrhea.  She states she has been having diarrhea intermittently since at least April.  She reports she had a bowel movement while in the [facility].  She endorses nausea and denies vomiting.  She denies chest pain, shortness of breath, fever, cough, abdominal pain, dysuria, hematuria, syncope or loss of consciousness.  She denies recent antibiotic use.  Social history: She lives with husband. She is a former tobacco user, quitting 2021. At her peak, she smoked 1 ppd. She started at age 89. She denies etoh and recreational drug use. She work as a Personnel officer.  ROS: Constitutional: no weight change, no  fever ENT/Mouth: no sore throat, no rhinorrhea Eyes: no eye pain, no vision changes Cardiovascular: no chest pain, no dyspnea,  no edema, no palpitations Respiratory: no cough, no sputum, no wheezing Gastrointestinal: + nausea, no vomiting, + diarrhea, no constipation Genitourinary: no urinary incontinence, no dysuria, no hematuria Musculoskeletal: no arthralgias, no myalgias Skin: no skin lesions, no pruritus, Neuro: + weakness, no loss of consciousness, no syncope Psych: no anxiety, no depression, + decrease appetite Heme/Lymph: no bruising, no bleeding  ED Course: Discussed with emergency medicine provider, patient requiring hospitalization for chief concerns of possible infliximab infusion.  Assessment/Plan  Principal Problem:   C. difficile colitis Active Problems:   Essential hypertension   Hyperlipidemia   Tobacco use   GAD (generalized anxiety disorder)   Metastatic lung cancer (metastasis from lung to other site) [facility])   Secondary adenocarcinoma of lung (HCC)   Diarrhea   Assessment and Plan:  * C. difficile colitis - C. difficile antigen positive and C. difficile toxin positive - Fidaxomicin 200 mg p.o. twice daily, 10 days ordered - Continue contac and t enteric precautions  Diarrhea - Check C. difficile, GI panel - Per patient caregiver, patient has taken prednisone 40 mg p.o. one-time dose at home prior to ED presentation - Discussed with GI, he recommends Solu-Medrol 10 mg IV daily starting on 05/08/2022, one-time dose of this have been ordered - GI has been consulted, Dr. Vicente [facility] who recommends admission, prednisone 60 mg p.o. one-time dose, check C. difficile and GI panel, and if negative can start patient on infliximab gtt. - Dr. Vicente [facility] who will evaluate the patient and  determine for need of infliximab gtt. given C. difficile was positive - Epic order placed to GI - Telemetry medical, observation  GAD (generalized anxiety disorder) - Alprazolam 0.5 mg p.o.  daily as needed for anxiety ordered  Tobacco use - Patient quit in 2021  Essential hypertension - Metoprolol tartrate 25 mg every morning resumed  Chart reviewed.   DVT prophylaxis: Enoxaparin Code Status: Full code Diet: Regular Family Communication: Updated spouse and friend at bedside with patient's permission Disposition Plan: Pending clinical course Consults called: GI Admission status: Telemetry medical, observation  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   Palpitations    Precordial chest pain    Wrist fracture 03/2021   left   Past Surgical History:  Procedure Laterality Date   BREAST BIOPSY Right 2017   benign   CERVICAL BIOPSY  W/ LOOP ELECTRODE EXCISION     COLONOSCOPY WITH PROPOFOL N/A 07/03/2015   Procedure: COLONOSCOPY WITH PROPOFOL;  Surgeon: Gina Luster, MD;  Location: [facility] ENDOSCOPY;  Service: Gastroenterology;  Laterality: N/A;   COLONOSCOPY WITH PROPOFOL N/A 01/06/2022   Procedure: COLONOSCOPY WITH PROPOFOL;  Surgeon: Gina Landsman, MD;  Location: [facility];  Service: Gastroenterology;  Laterality: N/A;   ESOPHAGOGASTRODUODENOSCOPY (EGD) WITH PROPOFOL N/A 07/03/2015   Procedure: ESOPHAGOGASTRODUODENOSCOPY (EGD) WITH PROPOFOL;  Surgeon: Gina Luster, MD;  Location: [place] ENDOSCOPY;  Service: Gastroenterology;  Laterality: N/A;   FLEXIBLE SIGMOIDOSCOPY N/A 03/12/2022   Procedure: FLEXIBLE SIGMOIDOSCOPY;  Surgeon: Gina Landsman, MD;  Location: [facility] ENDOSCOPY;  Service: Gastroenterology;  Laterality: N/A;   FRACTURE SURGERY     INTRAMEDULLARY (IM) NAIL INTERTROCHANTERIC Right 09/15/2020   Procedure: INTRAMEDULLARY (IM) NAIL INTERTROCHANTRIC;  Surgeon: Gina Mull, MD;  Location: [facility] ORS;  Service: Orthopedics;   Laterality: Right;   IR IMAGING GUIDED PORT INSERTION  11/28/2020   LEFT HEART CATH AND CORONARY ANGIOGRAPHY N/A 03/28/2017   Procedure: Left Heart Cath and Coronary Angiography;  Surgeon: Lorretta Harp, MD;  Location: [place] CV LAB;  Service: Cardiovascular;  Laterality: N/A;   ORIF WRIST FRACTURE Left 03/31/2021   Procedure: OPEN REDUCTION INTERNAL FIXATION (ORIF) LEFT DISTAL RADIUS FRACTURE.;  Surgeon: Gina Mull, MD;  Location: [facility] ORS;  Service: Orthopedics;  Laterality: Left;   Social History:  reports that she quit smoking about 19 months ago. Her smoking use included cigarettes. She has a 30.00 pack-year smoking history. She has never used smokeless tobacco. She reports that she does not currently use alcohol after a past usage of about 1.0 standard drink of alcohol per week. She reports that she does not use drugs.  Allergies  Allergen Reactions   Factive [Gemifloxacin] Rash   Family History  Problem Relation Age of Onset   Diabetes Mother        alive @ 65   Goiter Mother    Heart disease Father        died of MI @ 39   Osteoporosis Maternal Grandmother    Colon cancer Maternal Grandfather    Breast cancer Neg Hx    Ovarian cancer Neg Hx    Family history: Family history reviewed and not pertinent.  Prior to Admission medications   Medication  Sig Start Date End Date Taking? Authorizing Provider  ALPRAZolam Duanne Moron) 0.5 MG tablet TAKE 1 TABLET BY MOUTH DAILY AS NEEDED. 04/15/22   Sindy Guadeloupe, MD  famotidine (PEPCID) 20 MG tablet Take 20 mg by mouth in the morning.    [provider]  folic acid (FOLVITE) 1 MG tablet Take 1 tablet (1 mg total) by mouth daily. 05/04/22   Hughie Closs, PA-C  lidocaine-prilocaine (EMLA) cream Apply to affected area once Patient taking differently: Apply 1 application  topically as needed (prior to port being accessed). 10/19/20   Sindy Guadeloupe, MD  mesalamine (ROWASA) 4 g enema Place 60 mLs (4 g total) rectally at  bedtime for 7 doses. 04/22/22 05/04/22  Gina Landsman, MD  metoprolol tartrate (LOPRESSOR) 25 MG tablet Take 25 mg by mouth in the morning. 11/11/20   [provider]  ondansetron (ZOFRAN) 4 MG tablet TAKE 1 TABLET BY MOUTH EVERY 8 HOURS AS NEEDED FOR NAUSEA AND VOMITING 04/30/22   Hughie Closs, PA-C  oxyCODONE (OXY IR/ROXICODONE) 5 MG immediate release tablet TAKE 1-2 TABLETS BY MOUTH EVERY 4 HOURS AS NEEDED FOR MODERATE PAIN 04/15/22   Sindy Guadeloupe, MD  Pembrolizumab [facility] IV) Inject into the vein. Every 3 weeks    [provider]  PEMEtrexed Disodium (ALIMTA IV) Inject into the vein. Every 3 weeks    [provider]  predniSONE (DELTASONE) 10 MG tablet Take 4 tablets (40 mg total) by mouth daily with breakfast. 04/13/22 05/13/22  Gina Landsman, MD  prochlorperazine (COMPAZINE) 10 MG tablet TAKE 1 TABLET BY MOUTH EVERY 6 HOURS AS NEEDED FOR NAUSEA OR VOMITING 04/15/22   Sindy Guadeloupe, MD   Physical Exam: Vitals:   05/07/22 1700 05/07/22 1730 05/07/22 1746 05/07/22 1855  BP: 115/80 112/78  109/73  Pulse: 74 79  79  Resp: 18 17  18   Temp:   98.2 F (36.8 C) 98.2 F (36.8 C)  TempSrc:    Oral  SpO2: 96% 98%  97%   Constitutional: appears age-appropriate, NAD, calm, comfortable Eyes: PERRL, lids and conjunctivae normal ENMT: Mucous membranes are moist. Posterior pharynx clear of any exudate or lesions. Age-appropriate dentition. Hearing appropriate Neck: normal, supple, no masses, no thyromegaly Respiratory: clear to auscultation bilaterally, no wheezing, no crackles. Normal respiratory effort. No accessory muscle use.  Cardiovascular: Regular rate and rhythm, no murmurs / rubs / gallops. No extremity edema. 2+ pedal pulses. No carotid bruits.  Abdomen: no tenderness, no masses palpated, no hepatosplenomegaly. Bowel sounds active. Musculoskeletal: no clubbing / cyanosis. No joint deformity upper and lower extremities. Good ROM, no contractures, no  atrophy. Normal muscle tone.  Skin: no rashes, lesions, ulcers. No induration Neurologic: Sensation intact. Strength 5/5 in all 4.  Psychiatric: Normal judgment and insight. Alert and oriented x 3. Normal mood.   EKG: Not indicated  Chest x-ray on Admission: Not indicated  Labs on Admission: I have personally reviewed following labs CBC: Recent Labs  Lab 05/04/22 1034 05/07/22 1405  WBC 8.1 5.4  NEUTROABS 5.7 3.6  HGB 12.7 13.4  HCT 39.3 41.9  MCV 87.7 85.5  PLT 244 914   Basic Metabolic Panel: Recent Labs  Lab 05/04/22 1034 05/07/22 1405  NA 137 136  K 3.3* 3.8  CL 104 103  CO2 23 24  GLUCOSE 100* 180*  BUN 11 9  CREATININE 0.83 0.64  CALCIUM 7.8* 8.1*   GFR: Estimated Creatinine Clearance: 88.8 mL/min (by C-G formula based on  SCr of 0.64 mg/dL).  Liver Function Tests: Recent Labs  Lab 05/04/22 1034 05/07/22 1405  AST 24 21  ALT 22 23  ALKPHOS 60 68  BILITOT 1.1 0.7  PROT 5.3* 5.6*  ALBUMIN 2.7* 2.6*   Urine analysis:    Component Value Date/Time   COLORURINE YELLOW (A) 09/15/2020 0003   APPEARANCEUR HAZY (A) 09/15/2020 0003   APPEARANCEUR Clear 04/25/2018 0925   LABSPEC 1.023 09/15/2020 0003   PHURINE 5.0 09/15/2020 0003   GLUCOSEU NEGATIVE 09/15/2020 0003   HGBUR NEGATIVE 09/15/2020 0003   BILIRUBINUR NEGATIVE 09/15/2020 0003   BILIRUBINUR Negative 04/25/2018 0925   KETONESUR NEGATIVE 09/15/2020 0003   PROTEINUR NEGATIVE 09/15/2020 0003   UROBILINOGEN 0.2 12/26/2013 1525   NITRITE NEGATIVE 09/15/2020 0003   LEUKOCYTESUR NEGATIVE 09/15/2020 0003   Dr. Tobie Poet [facility]  If 7PM-7AM, please contact overnight-coverage provider If 7AM-7PM, please contact day coverage provider [email or web address]  05/07/2022, 8:20 PM

## 2022-05-07 NOTE — Assessment & Plan Note (Signed)
-   Patient quit in 2021

## 2022-05-07 NOTE — ED Triage Notes (Addendum)
Pt presents to ED from Dr. Lance Morin office due to 4 months of colitis and diarrhea. Pt is a stage 4 lung CA pt.  VSS. Pt denies fevers or chills.   Pt states no TX for CA due to colitis.   Pt states they want her admitted.

## 2022-05-07 NOTE — Hospital Course (Signed)
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 Marius Ditch outpatient and [facility], who presents emergency department for chief concerns of diarrhea failing outpatient therapy.  Initial vitals in the emergency department showed temperature of 98.2, respiration rate of 17, heart rate of 84, blood pressure 100/63, SPO2 96% on room air.  Serum sodium is 136, potassium 3 and 8, chloride 103, bicarb 24, BUN of 9, serum creatinine of 0.64, GFR greater than 60, nonfasting blood glucose 180.  CBC with differential was unremarkable.  ED treatment: Prednisone 60 mg p.o. one-time dose have been ordered, ketorolac 30 mg IV, oxycodone 5 mg p.o.  8/26: Patient remained stable.  C. Difficile PCR was positive. GI pathogen panel negative.  No leukocytosis.  Potassium at 3.2 with normal magnesium.  Most likely with GI losses.  Which was repleted.  Patient was started on fidaxomicin. Continue to have diarrhea but becoming little more formed.  No abdominal pain.  She wants to go home and is being discharged home on 10 days of fidaxomicin. Patient will follow-up with her gastroenterologist for further management. Patient is still undergoing treatment for her lung cancer.  She will continue on her home medications and follow-up with her providers.

## 2022-05-07 NOTE — ED Notes (Signed)
Transportation requested

## 2022-05-07 NOTE — Assessment & Plan Note (Signed)
-   Metoprolol tartrate 25 mg every morning resumed

## 2022-05-07 NOTE — ED Provider Notes (Signed)
[facility] Provider Note   Event Date/Time   First MD Initiated Contact with Patient 05/07/22 1547     (approximate) History  Abdominal Pain and Diarrhea  HPI Gina Sanchez is a 56 y.o. female with a stated past medical history of metastatic lung adenocarcinoma with metastasis to the bones, lymph nodes, and adrenals as well as chronic intermittent diarrhea that has been present since April.  Patient states that she has used multiple medications including prednisone taper and an unknown home infusion per gastroenterology both at our hospital as well as at [facility] with minimal relief in her symptoms.  Patient states that she has lost 20 pounds over this time and was instructed to present to the emergency department for admission.  Patient does endorse generalized and specifically back pain that is chronic for her but denies any significant abdominal pain at this time.  Patient states last diarrhea was this morning. ROS: Patient currently denies any vision changes, tinnitus, difficulty speaking, facial droop, sore throat, chest pain, shortness of breath, abdominal pain, nausea/vomiting, dysuria, or weakness/numbness/paresthesias in any extremity   Physical Exam  Triage Vital Signs: ED Triage Vitals  Enc Vitals Group     BP 05/07/22 1400 100/63     Pulse Rate 05/07/22 1400 84     Resp 05/07/22 1400 17     Temp 05/07/22 1400 98.2 F (36.8 C)     Temp Source 05/07/22 1400 Oral     SpO2 05/07/22 1400 96 %     Weight --      Height --      Head Circumference --      Peak Flow --      Pain Score 05/07/22 1401 6     Pain Loc --      Pain Edu? --      Excl. in [place]? --    Most recent vital signs: Vitals:   05/07/22 1400 05/07/22 1630  BP: 100/63 101/78  Pulse: 84 72  Resp: 17 17  Temp: 98.2 F (36.8 C)   SpO2: 96% 98%   General: Awake, oriented x4. CV:  Good peripheral perfusion.  Resp:  Normal effort.  Abd:  No distention.  Nontender Other:  Middle-aged  overweight Caucasian female laying in bed in no acute distress. ED Results / Procedures / Treatments  Labs (all labs ordered are listed, but only abnormal results are displayed) Labs Reviewed  COMPREHENSIVE METABOLIC PANEL - Abnormal; Notable for the following components:      Result Value   Glucose, Bld 180 (*)    Calcium 8.1 (*)    Total Protein 5.6 (*)    Albumin 2.6 (*)    All other components within normal limits  GASTROINTESTINAL PANEL BY PCR, STOOL (REPLACES STOOL CULTURE)  C DIFFICILE QUICK SCREEN W PCR REFLEX    CBC WITH DIFFERENTIAL/PLATELET   PROCEDURES: Critical Care performed: No .1-3 Lead EKG Interpretation  Performed by: Naaman Plummer, MD Authorized by: Naaman Plummer, MD     Interpretation: normal     ECG rate:  72   ECG rate assessment: normal     Rhythm: sinus rhythm     Ectopy: none     Conduction: normal    MEDICATIONS ORDERED IN ED: Medications  oxyCODONE (Oxy IR/ROXICODONE) immediate release tablet 5 mg (5 mg Oral Given 05/07/22 1618)  guaiFENesin (ROBITUSSIN) 100 MG/5ML liquid 5 mL (has no administration in time range)  predniSONE (DELTASONE) tablet 60 mg (has no administration  in time range)  sodium chloride 0.9 % bolus 1,000 mL (1,000 mLs Intravenous New Bag/Given 05/07/22 1629)  ketorolac (TORADOL) 30 MG/ML injection 30 mg (30 mg Intravenous Given 05/07/22 1628)   IMPRESSION / MDM / ASSESSMENT AND PLAN / ED COURSE  I reviewed the triage vital signs and the nursing notes.                             The patient is on the cardiac monitor to evaluate for evidence of arrhythmia and/or significant heart rate changes. Patient's presentation is most consistent with acute presentation with potential threat to life or bodily function. Patient is a 56 year old female with the above-stated past medical history presents for months of diarrhea with associated weight loss.  Patient states that she has tried multiple outpatient therapies including infusions at  home that have not helped in her symptoms.  Patient also states that she has been having diarrhea approximately 6-8 times per day and has not gotten any better over the last week.  Patient has been in contact with her GI physician, Dr. Marius Ditch through our facility as well as a gastroenterologist at [facility] who recommended admission today.  I spoke with the on-call gastroenterologist, Dr. Vicente Males, who recommends 60 mg oral prednisone, GI PCR, and C. difficile testing as well as admission for infliximab infusion starting tomorrow as he will be following her case.  At this time I have moderate suspicion for malnutrition given patient's hypoproteinemia and hypoalbuminemia.  There is no signs of significant dehydration on exam or on laboratory evaluation.  At this time I have low suspicion for peritonitis or hollow viscus rupture, acute liver failure, acute/chronic pancreatitis, or ruptured AAA.  I spoke to the hospitalist service who agrees to accept this patient for further evaluation and management.   FINAL CLINICAL IMPRESSION(S) / ED DIAGNOSES   Final diagnoses:  Chronic diarrhea   Rx / DC Orders   ED Discharge Orders     None      Note:  This document was prepared using Dragon voice recognition software and may include unintentional dictation errors.   Naaman Plummer, MD 05/07/22 [phone]

## 2022-05-07 NOTE — Progress Notes (Signed)
Date and time results received: 05/07/22 at [place] (use smartphrase ".now" to insert current time)  Test: Stool sample Critical Value: Positive test for C-diff   Name of Provider Notified: Amy Cox   Orders Received? Or Actions Taken?: Orders Received - See Orders for details and Actions Taken: MD notified and Enteric precautions started.

## 2022-05-07 NOTE — Assessment & Plan Note (Signed)
-   C. difficile antigen positive and C. difficile toxin positive - Fidaxomicin 200 mg p.o. twice daily, 10 days ordered - Continue contac and t enteric precautions

## 2022-05-07 NOTE — ED Notes (Signed)
Pt c/o lower abdominal pain and frequent diarrhea. Pt is AOX4, ambulatory, NAD noted.

## 2022-05-07 NOTE — Assessment & Plan Note (Addendum)
-   Check C. difficile, GI panel - Per patient caregiver, patient has taken prednisone 40 mg p.o. one-time dose at home prior to ED presentation - Discussed with GI, he recommends Solu-Medrol 10 mg IV daily starting on 05/08/2022, one-time dose of this have been ordered - GI has been consulted, Dr. Vicente Males who recommends admission, prednisone 60 mg p.o. one-time dose, check C. difficile and GI panel, and if negative can start patient on infliximab gtt. - Dr. Vicente Males who will evaluate the patient and determine for need of infliximab gtt. given C. difficile was positive - Epic order placed to GI - Telemetry medical, observation

## 2022-05-08 DIAGNOSIS — A0472 Enterocolitis due to Clostridium difficile, not specified as recurrent: Secondary | ICD-10-CM | POA: Diagnosis not present

## 2022-05-08 LAB — BASIC METABOLIC PANEL
Anion gap: 6 (ref 5–15)
BUN: 9 mg/dL (ref 6–20)
CO2: 27 mmol/L (ref 22–32)
Calcium: 7.6 mg/dL — ABNORMAL LOW (ref 8.9–10.3)
Chloride: 104 mmol/L (ref 98–111)
Creatinine, Ser: 0.61 mg/dL (ref 0.44–1.00)
GFR, Estimated: >60 mL/min (ref >60)
Glucose, Bld: 88 mg/dL (ref 70–99)
Potassium: 3.2 mmol/L — ABNORMAL LOW (ref 3.5–5.1)
Sodium: 137 mmol/L (ref 135–145)

## 2022-05-08 LAB — CBC
HCT: 38.3 % (ref 36.0–46.0)
Hemoglobin: 12.3 g/dL (ref 12.0–15.0)
MCH: 27.2 pg (ref 26.0–34.0)
MCHC: 32.1 g/dL (ref 30.0–36.0)
MCV: 84.7 fL (ref 80.0–100.0)
Platelets: 260 10*3/uL (ref 150–400)
RBC: 4.52 MIL/uL (ref 3.87–5.11)
RDW: 14.6 % (ref 11.5–15.5)
WBC: 7.2 10*3/uL (ref 4.0–10.5)
nRBC: 0 % (ref 0.0–0.2)

## 2022-05-08 LAB — C-REACTIVE PROTEIN: CRP: 12 mg/dL — ABNORMAL HIGH (ref <1.0)

## 2022-05-08 LAB — MAGNESIUM: Magnesium: 2 mg/dL (ref 1.7–2.4)

## 2022-05-08 MED ORDER — FIDAXOMICIN 200 MG PO TABS: 200.0000 mg | ORAL_TABLET | Freq: Two times a day (BID) | ORAL | 0 refills | Status: AC

## 2022-05-08 MED ORDER — POTASSIUM CHLORIDE CRYS ER 20 MEQ PO TBCR
40.0000 meq | EXTENDED_RELEASE_TABLET | Freq: Once | ORAL | Status: AC
  Administered 2022-05-08: 40 meq via ORAL
  Filled 2022-05-08: qty 2

## 2022-05-08 NOTE — Consult Note (Signed)
  GI   She was admitted last evening with severe colitis aftr OP visit with [facility] GI for second opinion . Plan was for her to be followed next few days and get IV infliximab . Patient ws discussed with me last night with Dr Tobie Poet. She was discharged before I had seen this today . Was not aware of her discharge.  Dr Jonathon Bellows MD,MRCP [facility]) Gastroenterology/Hepatology Pager: [phone]

## 2022-05-08 NOTE — Discharge Summary (Signed)
Physician Discharge Summary   Patient: Gina Sanchez MRN: [id number] DOB: 05-Dec-1965  Admit date:     05/07/2022  Discharge date: 05/08/22  Discharge Physician: Lorella Nimrod   PCP: Remi Haggard, FNP   Recommendations at discharge:  Please ensure the completion of 10-day course of fidaxomicin for C. difficile colitis. Follow-up with primary care provider, gastroenterology and oncology  Discharge Diagnoses: Principal Problem:   C. difficile colitis Active Problems:   Essential hypertension   Hyperlipidemia   Tobacco use   GAD (generalized anxiety disorder)   Metastatic lung cancer (metastasis from lung to other site) [facility])   Secondary adenocarcinoma of lung [facility])   Diarrhea   Hospital Course: Ms. 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 Marius Ditch outpatient and [facility], who presents emergency department for chief concerns of diarrhea failing outpatient therapy.  Initial vitals in the emergency department showed temperature of 98.2, respiration rate of 17, heart rate of 84, blood pressure 100/63, SPO2 96% on room air.  Serum sodium is 136, potassium 3 and 8, chloride 103, bicarb 24, BUN of 9, serum creatinine of 0.64, GFR greater than 60, nonfasting blood glucose 180.  CBC with differential was unremarkable.  ED treatment: Prednisone 60 mg p.o. one-time dose have been ordered, ketorolac 30 mg IV, oxycodone 5 mg p.o.  8/26: Patient remained stable.  C. Difficile PCR was positive. GI pathogen panel negative.  No leukocytosis.  Potassium at 3.2 with normal magnesium.  Most likely with GI losses.  Which was repleted.  Patient was started on fidaxomicin. Continue to have diarrhea but becoming little more formed.  No abdominal pain.  She wants to go home and is being discharged home on 10 days of fidaxomicin. Patient will follow-up  with her gastroenterologist for further management. Patient is still undergoing treatment for her lung cancer.  She will continue on her home medications and follow-up with her providers.  Assessment and Plan: * C. difficile colitis - C. difficile antigen positive and C. difficile toxin positive - Fidaxomicin 200 mg p.o. twice daily, 10 days ordered - Continue contac and t enteric precautions  Diarrhea - Check C. difficile, GI panel - Per patient caregiver, patient has taken prednisone 40 mg p.o. one-time dose at home prior to ED presentation - Discussed with GI, he recommends Solu-Medrol 10 mg IV daily starting on 05/08/2022, one-time dose of this have been ordered - GI has been consulted, Dr. Vicente Males who recommends admission, prednisone 60 mg p.o. one-time dose, check C. difficile and GI panel, and if negative can start patient on infliximab gtt. - Dr. Vicente Males who will evaluate the patient and determine for need of infliximab gtt. given C. difficile was positive - Epic order placed to GI - Telemetry medical, observation  GAD (generalized anxiety disorder) - Alprazolam 0.5 mg p.o. daily as needed for anxiety ordered  Tobacco use - Patient quit in 2021  Essential hypertension - Metoprolol tartrate 25 mg every morning resumed   Consultants: None Procedures performed: None Disposition: Home Diet recommendation:  Discharge Diet Orders (From admission, onward)     Start     Ordered   05/08/22 0000  Diet - low sodium heart healthy        05/08/22 1215           Regular diet DISCHARGE MEDICATION: Allergies as of 05/08/2022  Reactions   Factive [gemifloxacin] Rash        Medication List     TAKE these medications    ALIMTA IV Inject into the vein. Every 3 weeks   ALPRAZolam 0.5 MG tablet Commonly known as: XANAX TAKE 1 TABLET BY MOUTH DAILY AS NEEDED.   famotidine 20 MG tablet Commonly known as: PEPCID Take 20 mg by mouth in the morning.   fidaxomicin 200  MG Tabs tablet Commonly known as: DIFICID Take 1 tablet (200 mg total) by mouth 2 (two) times daily for 10 days.   folic acid 1 MG tablet Commonly known as: FOLVITE Take 1 tablet (1 mg total) by mouth daily.   KEYTRUDA IV Inject into the vein. Every 3 weeks   lidocaine-prilocaine cream Commonly known as: EMLA Apply to affected area once What changed:  how much to take how to take this when to take this reasons to take this additional instructions   mesalamine 4 g enema Commonly known as: ROWASA Place 60 mLs (4 g total) rectally at bedtime for 7 doses.   metoprolol tartrate 25 MG tablet Commonly known as: LOPRESSOR Take 25 mg by mouth in the morning.   ondansetron 4 MG tablet Commonly known as: ZOFRAN TAKE 1 TABLET BY MOUTH EVERY 8 HOURS AS NEEDED FOR NAUSEA AND VOMITING   oxyCODONE 5 MG immediate release tablet Commonly known as: Oxy IR/ROXICODONE TAKE 1-2 TABLETS BY MOUTH EVERY 4 HOURS AS NEEDED FOR MODERATE PAIN   predniSONE 10 MG tablet Commonly known as: DELTASONE Take 4 tablets (40 mg total) by mouth daily with breakfast.   prochlorperazine 10 MG tablet Commonly known as: COMPAZINE TAKE 1 TABLET BY MOUTH EVERY 6 HOURS AS NEEDED FOR NAUSEA OR VOMITING        Follow-up Information     Remi Haggard, FNP. Schedule an appointment as soon as possible for a visit in 1 week(s).   Specialty: Family Medicine Contact information: [address] [place] [address] [phone]                Discharge Exam: Danley Danker Weights   05/07/22 2000  Weight: 88.1 kg   General.     In no acute distress. Pulmonary.  Lungs clear bilaterally, normal respiratory effort. CV.  Regular rate and rhythm, no JVD, rub or murmur. Abdomen.  Soft, nontender, nondistended, BS positive. CNS.  Alert and oriented .  No focal neurologic deficit. Extremities.  No edema, no cyanosis, pulses intact and symmetrical. Psychiatry.  Judgment and insight appears normal.    Condition at discharge: stable  The results of significant diagnostics from this hospitalization (including imaging, microbiology, ancillary and laboratory) are listed below for reference.   Imaging Studies: No results found.  Microbiology: Results for orders placed or performed during the hospital encounter of 05/07/22  Gastrointestinal Panel by PCR , Stool     Status: None   Collection Time: 05/07/22  5:39 PM   Specimen: Stool  Result Value Ref Range Status   Campylobacter species NOT DETECTED NOT DETECTED Final   Plesimonas shigelloides NOT DETECTED NOT DETECTED Final   Salmonella species NOT DETECTED NOT DETECTED Final   Yersinia enterocolitica NOT DETECTED NOT DETECTED Final   Vibrio species NOT DETECTED NOT DETECTED Final   Vibrio cholerae NOT DETECTED NOT DETECTED Final   Enteroaggregative E coli (EAEC) NOT DETECTED NOT DETECTED Final   Enteropathogenic E coli (EPEC) NOT DETECTED NOT DETECTED Final   Enterotoxigenic E coli (ETEC) NOT DETECTED NOT DETECTED Final  Shiga like toxin producing E coli (STEC) NOT DETECTED NOT DETECTED Final   Shigella/Enteroinvasive E coli (EIEC) NOT DETECTED NOT DETECTED Final   Cryptosporidium NOT DETECTED NOT DETECTED Final   Cyclospora cayetanensis NOT DETECTED NOT DETECTED Final   Entamoeba histolytica NOT DETECTED NOT DETECTED Final   Giardia lamblia NOT DETECTED NOT DETECTED Final   Adenovirus F40/41 NOT DETECTED NOT DETECTED Final   Astrovirus NOT DETECTED NOT DETECTED Final   Norovirus GI/GII NOT DETECTED NOT DETECTED Final   Rotavirus A NOT DETECTED NOT DETECTED Final   Sapovirus (I, II, IV, and V) NOT DETECTED NOT DETECTED Final    Comment: Performed at [facility], [address] [place], [place] [address]  C Difficile Quick Screen w PCR reflex     Status: Abnormal   Collection Time: 05/07/22  5:39 PM   Specimen: Stool  Result Value Ref Range Status   C Diff antigen POSITIVE (A) NEGATIVE Final   C Diff toxin  POSITIVE (A) NEGATIVE Final   C Diff interpretation Toxin producing C. difficile detected.  Final    Comment: CRITICAL RESULT CALLED TO, READ BACK BY AND VERIFIED WITH: ALEJALDA PEJADA AT [place] ON 05/07/22 BY SS Performed at [facility], [address] [place], [place] [address]   SARS Coronavirus 2 by RT PCR (hospital order, performed in [facility] hospital lab) *cepheid single result test* Anterior Nasal Swab     Status: None   Collection Time: 05/07/22  5:39 PM   Specimen: Anterior Nasal Swab  Result Value Ref Range Status   SARS Coronavirus 2 by RT PCR NEGATIVE NEGATIVE Final    Comment: (NOTE) SARS-CoV-2 target nucleic acids are NOT DETECTED.  The SARS-CoV-2 RNA is generally detectable in upper and lower respiratory specimens during the acute phase of infection. The lowest concentration of SARS-CoV-2 viral copies this assay can detect is 250 copies / mL. A negative result does not preclude SARS-CoV-2 infection and should not be used as the sole basis for treatment or other patient management decisions.  A negative result may occur with improper specimen collection / handling, submission of specimen other than nasopharyngeal swab, presence of viral mutation(s) within the areas targeted by this assay, and inadequate number of viral copies (<250 copies / mL). A negative result must be combined with clinical observations, patient history, and epidemiological information.  Fact Sheet for Patients:   https://www.patel.info/  Fact Sheet for Healthcare Providers: https://hall.com/  This test is not yet approved or  cleared by the [place] FDA and has been authorized for detection and/or diagnosis of SARS-CoV-2 by FDA under an Emergency Use Authorization (EUA).  This EUA will remain in effect (meaning this test can be used) for the duration of the COVID-19 declaration under Section 564(b)(1) of the Act, 21 U.S.C. section  360bbb-3(b)(1), unless the authorization is terminated or revoked sooner.  Performed at [facility], [address] [place],  [address]     Labs: CBC: Recent Labs  Lab 05/04/22 1034 05/07/22 1405 05/08/22 0442  WBC 8.1 5.4 7.2  NEUTROABS 5.7 3.6  --   HGB 12.7 13.4 12.3  HCT 39.3 41.9 38.3  MCV 87.7 85.5 84.7  PLT 244 290 263   Basic Metabolic Panel: Recent Labs  Lab 05/04/22 1034 05/07/22 1405 05/08/22 0442 05/08/22 1025  NA 137 136 137  --   K 3.3* 3.8 3.2*  --   CL 104 103 104  --   CO2 23 24 27   --  GLUCOSE 100* 180* 88  --   BUN 11 9 9   --   CREATININE 0.83 0.64 0.61  --   CALCIUM 7.8* 8.1* 7.6*  --   MG  --   --   --  2.0   Liver Function Tests: Recent Labs  Lab 05/04/22 1034 05/07/22 1405  AST 24 21  ALT 22 23  ALKPHOS 60 68  BILITOT 1.1 0.7  PROT 5.3* 5.6*  ALBUMIN 2.7* 2.6*   CBG: No results for input(s): "GLUCAP" in the last 168 hours.  Discharge time spent: greater than 30 minutes.  This record has been created using Systems analyst. Errors have been sought and corrected,but may not always be located. Such creation errors do not reflect on the standard of care.   Signed: Lorella Nimrod, MD [facility] 05/08/2022

## 2022-05-08 NOTE — Progress Notes (Signed)
Pt has DC order. AVS was given and explained to pt and husband. Pt was instructed to follow-up with GI doctor with her steroid. All questions has been answered. Husband will drive pt home.

## 2022-05-09 NOTE — Progress Notes (Signed)
fyi

## 2022-05-10 ENCOUNTER — Telehealth (INDEPENDENT_AMBULATORY_CARE_PROVIDER_SITE_OTHER): Payer: 59 | Admitting: Gastroenterology

## 2022-05-10 ENCOUNTER — Other Ambulatory Visit: Payer: Self-pay | Admitting: *Deleted

## 2022-05-10 ENCOUNTER — Other Ambulatory Visit: Payer: Self-pay

## 2022-05-10 ENCOUNTER — Telehealth: Payer: Self-pay

## 2022-05-10 DIAGNOSIS — R197 Diarrhea, unspecified: Secondary | ICD-10-CM

## 2022-05-10 DIAGNOSIS — A0472 Enterocolitis due to Clostridium difficile, not specified as recurrent: Secondary | ICD-10-CM | POA: Diagnosis not present

## 2022-05-10 DIAGNOSIS — K523 Indeterminate colitis: Secondary | ICD-10-CM

## 2022-05-10 DIAGNOSIS — A498 Other bacterial infections of unspecified site: Secondary | ICD-10-CM | POA: Diagnosis not present

## 2022-05-10 DIAGNOSIS — E876 Hypokalemia: Secondary | ICD-10-CM

## 2022-05-10 DIAGNOSIS — C3491 Malignant neoplasm of unspecified part of right bronchus or lung: Secondary | ICD-10-CM

## 2022-05-10 MED ORDER — DIFICID 200 MG PO TABS: 200.0000 mg | ORAL_TABLET | Freq: Two times a day (BID) | ORAL | 0 refills | Status: DC

## 2022-05-10 NOTE — Addendum Note (Signed)
Addended by: Cephas Darby on: 05/10/2022 10:45 PM   Modules accepted: Level of Service

## 2022-05-10 NOTE — Telephone Encounter (Signed)
Patient insurance is having to go through [employer] now. Filled out form and will fax.   Per Sharrie Rothman; Ms. Zacher is getting her last infusion with [place] this week then will be triaged over to [employer] due to her [employer].  I know Dr. Marius Ditch wants to change her to every 4 weeks after induction.  Can you fax the new order of every 4 Weeks to [place] [place] at [phone]?

## 2022-05-10 NOTE — Telephone Encounter (Signed)
Patient left vm requesting a call back from Dr Verlin Grills nurse.

## 2022-05-10 NOTE — Telephone Encounter (Signed)
Faxed form to [employer] the fax number on the form and also faxed for to steve at [employer]. Marked it as urgent

## 2022-05-10 NOTE — Telephone Encounter (Signed)
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

## 2022-05-10 NOTE — Progress Notes (Signed)
Per Dr. Marius Ditch  let her continue Entyvio until we get Remicade approved.  Also, send in prescription for Dificid 200 mg twice daily for 2 more weeks, recurrent C. difficile infection.  Sent medication to the pharmacy and called patient and left a detail message

## 2022-05-10 NOTE — Telephone Encounter (Signed)
Called patient today to check on how she is doing.  She was diagnosed with recurrent C. difficile, started on Dificid 200 mg p.o. twice daily.  Apparently, patient could not get Dificid covered by her insurance when she was discharged, missed taking medication yesterday.  She was able to get the same prescription sent by me today covered by her insurance and started taking it today.  She reports that she is feeling somewhat better.  Informed patient that the paperwork for Remicade has been submitted.  She received Entyvio infusion today.  Encouraged her to have high-protein diet such as protein supplements, lean meat etc  Caryl Pina  Please cancel her appointment with me for tomorrow Please send another prescription for Dificid 200 mg p.o. twice daily for another 10 days Please update patient on Remicade status

## 2022-05-10 NOTE — Telephone Encounter (Signed)
Per Dr. Marius Ditch she is going to call the patient

## 2022-05-11 ENCOUNTER — Inpatient Hospital Stay: Payer: 59

## 2022-05-11 ENCOUNTER — Inpatient Hospital Stay: Payer: 59 | Admitting: Hospice and Palliative Medicine

## 2022-05-11 ENCOUNTER — Ambulatory Visit: Payer: 59 | Admitting: Gastroenterology

## 2022-05-11 MED ORDER — FLUCONAZOLE 200 MG PO TABS: 200.0000 mg | ORAL_TABLET | Freq: Every day | ORAL | 0 refills | Status: AC

## 2022-05-11 NOTE — Telephone Encounter (Signed)
Gina Sanchez called back and she took down the patient information and she is going to send a email to the team to research the patient and get a update on the medication. She states she will cc me on the email also so I will have a update

## 2022-05-11 NOTE — Telephone Encounter (Signed)
Richardson Landry called back and he states he does not take care of the [facility] plans infusion. He steps the rep for them is name Lattie Haw and her phone number is [phone]. He said that I needed to call her. Called her and left a message for call back

## 2022-05-11 NOTE — Telephone Encounter (Signed)
Canceled appointment for today with Dr. Marius Ditch sent in a new prescription  for Dificid to the pharmacy. Called Sharrie Rothman and got the number for the new infusion rep which name is steve. His number is [phone] called and left a message for call back told him I needed to talk to him ASAP

## 2022-05-11 NOTE — Addendum Note (Signed)
Addended by: Ulyess Blossom L on: 05/11/2022 10:05 AM   Modules accepted: Orders

## 2022-05-12 MED ORDER — DIFICID 200 MG PO TABS: 200.0000 mg | ORAL_TABLET | Freq: Two times a day (BID) | ORAL | 0 refills | Status: AC

## 2022-05-12 NOTE — Addendum Note (Signed)
Addended by: Ulyess Blossom L on: 05/12/2022 07:45 AM   Modules accepted: Orders

## 2022-05-13 ENCOUNTER — Telehealth: Payer: Self-pay

## 2022-05-13 ENCOUNTER — Other Ambulatory Visit: Payer: Self-pay | Admitting: Oncology

## 2022-05-13 NOTE — Telephone Encounter (Signed)
Gina Sanchez with [facility] states that they do not take care of this and that it needs to go through [place] back and he states no they do not take care of Remicaid or the bio similars but [facility] does. Informed him I was really confused who is supposed to be doing the medication but the patient needs the medication ASAP. He understands and is going to find out some answers and give me call back today or tomorrow

## 2022-05-13 NOTE — Telephone Encounter (Signed)
Richardson Landry called back and said yes [facility] does this medication and he has emailed them. He states they should be reaching out to me today about this medication

## 2022-05-13 NOTE — Telephone Encounter (Signed)
Norm Parcel with [facility] and he states it has now been transferred over to [facility] and rebecca would be in contact with [place] about the medication for the patient. He stated he was sorry about all the confusion

## 2022-05-18 ENCOUNTER — Encounter: Payer: Self-pay | Admitting: Gastroenterology

## 2022-05-18 ENCOUNTER — Telehealth: Payer: Self-pay

## 2022-05-18 NOTE — Telephone Encounter (Signed)
Talk to [facility] Patient will get her first does at [facility] and then she will go get home infusion. [facility] confirm the orders and confirmed the orders with them. She states she will send everything to nursing and they will be in contact with [place] if they need anything else

## 2022-05-18 NOTE — Telephone Encounter (Signed)
Submitted PA through cover my meds for patient infusion. Waiting on response from insurance

## 2022-05-19 ENCOUNTER — Telehealth: Payer: Self-pay

## 2022-05-19 MED ORDER — DIFICID 200 MG PO TABS: 200.0000 mg | ORAL_TABLET | Freq: Every day | ORAL | 0 refills | Status: DC

## 2022-05-19 NOTE — Telephone Encounter (Signed)
Patient verbalized understanding of results. She states the diarrhea is much better and she states she is taking the Dificid. She states she has 5 more days left of Dificid. She also wants to make sure you have contacted Dr. Janese Banks about the Remicaid medication. She states that she wants to make sure it will not interact with her cancer

## 2022-05-19 NOTE — Telephone Encounter (Signed)
-----   Message from Lin Landsman, MD sent at 05/18/2022  4:47 PM EDT ----- Gina Sanchez  Please inform patient that her labs look better including CRP.  Please check if she is still taking Dificid and if her diarrhea is improving.  She should continue taking it twice daily until I tell her to decrease the dose  RV

## 2022-05-19 NOTE — Telephone Encounter (Signed)
Please refill Dificid 200 mg 1 pill daily for 4 weeks after she finishes 5 more days.  Inform patient that I have discussed with Dr. Janese Banks regarding Remicade and at this point her colitis takes priority and we can proceed with Remicade from her cancer standpoint.  We may reconsider the option of continuing Entyvio instead of switching to Remicade if she really continues to feel better in next 2 to 3 weeks  She should let us know before receiving Remicade infusion  Angee Gupton

## 2022-05-19 NOTE — Addendum Note (Signed)
Addended by: Ulyess Blossom L on: 05/19/2022 04:31 PM   Modules accepted: Orders

## 2022-05-19 NOTE — Telephone Encounter (Signed)
Patient verbalized understanding. She states the infusion place told her she had to drive a hour for the infusion every infusion. Informed her they told me that it was only for the first infusion and I will check with the rep to make sure this is correct. Sent Dificid to the pharmacy for 4 weeks.   Emailed Fransico Meadow with [facility] this  good afternoon,  On Patient Gina Sanchez DOB 03/06/2021. She states she talk with the nurse, and she is going to have to drive for a hour for her infusions several times. I was informed that the first infusion that she would have to go to the infusion site but after that she would have home infusion. please let me know because the patient can not drive an hour for every infusion.  thank you  Caryl Pina CMA

## 2022-05-20 ENCOUNTER — Telehealth: Payer: Self-pay

## 2022-05-20 MED ORDER — DIFICID 200 MG PO TABS: 200.0000 mg | ORAL_TABLET | Freq: Two times a day (BID) | ORAL | 0 refills | Status: DC

## 2022-05-20 NOTE — Telephone Encounter (Signed)
Per Theone Murdoch with [facility] they emailed me and said  Good Morning Caryl Pina ??   Hope you're doing well today!  Lattie Haw and I found out that it is only the initial visit is at the [facility], then after that it will be at home infusion visits     Please let Lattie Haw or I know if you have any questions or need any further assistance     Thank You ??

## 2022-05-20 NOTE — Telephone Encounter (Signed)
A nurse with [facility] called and said that it is there requirement that all the induction does are at the infusion site. So she would have to have the 0,2, 6 at the infusion site. She states the patient does not want to drive that far which is a hour away. She asked if we had any where she could go. I said we use Same day surgery at [facility]. She asked for there number which is [phone] and also gave them the pharmacy number which is [phone]. She states she will let us know the outcome

## 2022-05-20 NOTE — Telephone Encounter (Signed)
Called [facility] on [address] and they state they have 2 open bottles and can get the medication from there supplier. Called patient and left a detail message and sent mychart message

## 2022-05-20 NOTE — Telephone Encounter (Signed)
Per [facility] Dificid is on short term back order. Please advise

## 2022-05-25 ENCOUNTER — Telehealth: Payer: Self-pay

## 2022-05-25 NOTE — Telephone Encounter (Signed)
Submitted PA for medication through cover my meds last week. Called the pharmacy side of the insurance and they said the plan includes this medication and we would need to talk to the medical side of the insurance company. She said to call [phone] they then said I need to talk to [phone]. Then they said I was not talk to the right department and I needed to talk to [phone]. Then they transfer me to [phone] option 3. I then on the recording said I could do it online through [place]. Went online and submitted the claim online with J code [id number]. She said she does see that claim is pending and this is handle through the speciality department. Attached clinicals to the request. There number is [phone]. She is checking with them to make sure they have all the information they needed they transfer me to the speciality department that handle this and they transfer me to [facility]  they asked the height and weight of the member and the dosing of the medication. They said they have a new reference number pending case number [id number]. They also need [place] to fax clinicals to them at [phone]. They said I could call them back at [phone].

## 2022-05-25 NOTE — Telephone Encounter (Signed)
Transaction ID: [id number] Customer ID: [id number] Transaction Date: 2022-05-25 [name] Patient Member ID [id number]  Date of Birth Jan 16, 1966  Gender Female  Eligibility Status Active Coverage  Group Number [id number]  Plan / Coverage Date 2021-09-13  Transaction Type Outpatient Authorization  Organization [employer] [employer] [place] [employer] (COMMERCIAL & MEDICARE)  Data processing manager   Certificate Information Reference Number [id number]  Status PENDED  Review Reason 1 Requires Medical Review  Message REVIEW RESPONSE MESSAGE FOR ALL SERVICES BELOW AS THERE MAY BE REFERRAL(S) TO OTHER SERVICE PARTNERS. FOR SERVICES IN PENDED STATUS ADDITIONAL INFORMATION MAY BE NEEDED  Member Information Patient Name Gina Sanchez, Gina Sanchez  Patient Date of Birth 1966-02-01  Patient Gender Female  Member ID [id number]  Relationship to [place] Name Gina Sanchez, Gina Sanchez  Requesting Provider    Name [place], [place] [id number]  Provider Role Provider  Address [address] 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

## 2022-05-27 ENCOUNTER — Telehealth: Payer: Self-pay

## 2022-05-27 DIAGNOSIS — K523 Indeterminate colitis: Secondary | ICD-10-CM

## 2022-05-27 NOTE — Telephone Encounter (Signed)
Patient called to see what the status of her Remicaid infusion. Informed her I am still waiting on insurance to approved the infusion. Asked patient how she was feeling. She states she is still having diarrhea 3-4 times a day. She states she has no energy still. She is still taking the vancomycin

## 2022-05-27 NOTE — Telephone Encounter (Signed)
Per Dr. Marius Ditch she said to order CBC cmp and CRP. Patient states she will go get labs done

## 2022-05-28 NOTE — Telephone Encounter (Signed)
Called [employer] this morning and they said the infusion has been approved auth number is [id number] good from 05/25/2022 to 06/23/2022. Email [place] to inform them it has been approved and to let me know when she has been scheduled. Called and informed patient it has been approved and they would be reaching out to set up the infusion.

## 2022-05-31 DIAGNOSIS — K523 Indeterminate colitis: Secondary | ICD-10-CM | POA: Diagnosis not present

## 2022-06-01 ENCOUNTER — Encounter: Payer: Self-pay | Admitting: Oncology

## 2022-06-01 LAB — COMPREHENSIVE METABOLIC PANEL
ALT: 22 IU/L (ref 0–32)
AST: 19 IU/L (ref 0–40)
Albumin/Globulin Ratio: 1.4 (ref 1.2–2.2)
Albumin: 3.4 g/dL — ABNORMAL LOW (ref 3.8–4.9)
Alkaline Phosphatase: 110 IU/L (ref 44–121)
BUN/Creatinine Ratio: 12 (ref 9–23)
BUN: 8 mg/dL (ref 6–24)
Bilirubin Total: 0.5 mg/dL (ref 0.0–1.2)
CO2: 19 mmol/L — ABNORMAL LOW (ref 20–29)
Calcium: 9 mg/dL (ref 8.7–10.2)
Chloride: 105 mmol/L (ref 96–106)
Creatinine, Ser: 0.68 mg/dL (ref 0.57–1.00)
Globulin, Total: 2.5 g/dL (ref 1.5–4.5)
Glucose: 142 mg/dL — ABNORMAL HIGH (ref 70–99)
Potassium: 4.3 mmol/L (ref 3.5–5.2)
Sodium: 141 mmol/L (ref 134–144)
Total Protein: 5.9 g/dL — ABNORMAL LOW (ref 6.0–8.5)
eGFR: 103 mL/min/{1.73_m2} (ref >59)

## 2022-06-01 LAB — CBC
Hematocrit: 40.9 % (ref 34.0–46.6)
Hemoglobin: 13 g/dL (ref 11.1–15.9)
MCH: 27.7 pg (ref 26.6–33.0)
MCHC: 31.8 g/dL (ref 31.5–35.7)
MCV: 87 fL (ref 79–97)
Platelets: 331 10*3/uL (ref 150–450)
RBC: 4.69 x10E6/uL (ref 3.77–5.28)
RDW: 14.1 % (ref 11.7–15.4)
WBC: 12.3 10*3/uL — ABNORMAL HIGH (ref 3.4–10.8)

## 2022-06-01 LAB — C-REACTIVE PROTEIN: CRP: 8 mg/L (ref 0–10)

## 2022-06-02 ENCOUNTER — Telehealth: Payer: Self-pay | Admitting: Gastroenterology

## 2022-06-02 ENCOUNTER — Other Ambulatory Visit: Payer: Self-pay

## 2022-06-02 DIAGNOSIS — A498 Other bacterial infections of unspecified site: Secondary | ICD-10-CM

## 2022-06-02 DIAGNOSIS — K523 Indeterminate colitis: Secondary | ICD-10-CM

## 2022-06-02 NOTE — Telephone Encounter (Signed)
Called patient to discuss about the lab results.  She has mild leukocytosis, otherwise CRP is finally back to normal, total protein and serum albumin levels are also improving.  Clinically, patient reports that she is having 4-5 mushy bowel movements daily, appetite has significantly improved, she feels much better.  She is currently on Dificid 200 mg once a day and prednisone 5 mg daily.  She tells me that Remicade is approved but waiting for scheduling the infusion.  Discussed with her about flexible sigmoidoscopy to assess severity of colitis before switching to Remicade.  If there is a significant improvement in colitis, I am inclined to continue Entyvio and she seems to be agreeable to it   [place]  Please schedule flexible sigmoidoscopy for next week after her appointment with oncology on 9/26  Sherri Sear, MD

## 2022-06-02 NOTE — Telephone Encounter (Signed)
Order Gi profile and patient will come on 06/11/2022 to have the Flexsigmoid

## 2022-06-02 NOTE — Addendum Note (Signed)
Addended by: Ulyess Blossom L on: 06/02/2022 08:57 AM   Modules accepted: Orders

## 2022-06-02 NOTE — Telephone Encounter (Signed)
Schedule patient a flexsigmoid for 06/24/2022. That was your next available scope time. Patient is wanting to know if you want to repeat the stool test to make sure the Cdiff is cleared up

## 2022-06-02 NOTE — Telephone Encounter (Signed)
Agree with GI profile PCR and move to next Friday if pt is agreeable  RV

## 2022-06-02 NOTE — Telephone Encounter (Signed)
The PA is under rendering facility [facility] it needs to be changed to [facility] NPI [id number] and we need to change the dispense from home to pharmacy. [employer] and got the NPI changed . Called [facility] back and informed them this has been changed

## 2022-06-03 ENCOUNTER — Ambulatory Visit
Admission: RE | Admit: 2022-06-03 | Discharge: 2022-06-03 | Disposition: A | Discharge status: Home / Self Care | Payer: 59 | Source: Ambulatory Visit | Attending: Oncology | Admitting: Oncology

## 2022-06-03 DIAGNOSIS — I700 Atherosclerosis of aorta: Secondary | ICD-10-CM | POA: Diagnosis not present

## 2022-06-03 DIAGNOSIS — C3491 Malignant neoplasm of unspecified part of right bronchus or lung: Secondary | ICD-10-CM | POA: Diagnosis not present

## 2022-06-03 DIAGNOSIS — K529 Noninfective gastroenteritis and colitis, unspecified: Secondary | ICD-10-CM | POA: Diagnosis not present

## 2022-06-03 DIAGNOSIS — C7951 Secondary malignant neoplasm of bone: Secondary | ICD-10-CM | POA: Diagnosis not present

## 2022-06-03 DIAGNOSIS — K6389 Other specified diseases of intestine: Secondary | ICD-10-CM | POA: Diagnosis not present

## 2022-06-03 DIAGNOSIS — R590 Localized enlarged lymph nodes: Secondary | ICD-10-CM | POA: Diagnosis not present

## 2022-06-03 DIAGNOSIS — K7689 Other specified diseases of liver: Secondary | ICD-10-CM | POA: Diagnosis not present

## 2022-06-03 MED ORDER — IOHEXOL 300 MG/ML  SOLN
100.0000 mL | Freq: Once | INTRAMUSCULAR | Status: AC | PRN
  Administered 2022-06-03: 100 mL via INTRAVENOUS

## 2022-06-07 ENCOUNTER — Telehealth: Payer: Self-pay

## 2022-06-07 LAB — GI PROFILE, STOOL, PCR

## 2022-06-07 NOTE — Telephone Encounter (Signed)
-----   Message from Lin Landsman, MD sent at 06/07/2022  8:32 AM EDT ----- Repeat stool studies came back negative for C. Difficile  RV

## 2022-06-07 NOTE — Telephone Encounter (Signed)
Patient verbalized understanding of results  

## 2022-06-08 ENCOUNTER — Encounter: Payer: Self-pay | Admitting: Oncology

## 2022-06-08 ENCOUNTER — Inpatient Hospital Stay: Payer: 59

## 2022-06-08 ENCOUNTER — Telehealth: Payer: Self-pay | Admitting: Gastroenterology

## 2022-06-08 ENCOUNTER — Inpatient Hospital Stay: Payer: 59 | Attending: Oncology | Admitting: Oncology

## 2022-06-08 VITALS — BP 118/84 | HR 81 | Temp 98.1°F | Resp 18 | Wt 197.2 lb

## 2022-06-08 DIAGNOSIS — C770 Secondary and unspecified malignant neoplasm of lymph nodes of head, face and neck: Secondary | ICD-10-CM | POA: Insufficient documentation

## 2022-06-08 DIAGNOSIS — C7951 Secondary malignant neoplasm of bone: Secondary | ICD-10-CM | POA: Diagnosis not present

## 2022-06-08 DIAGNOSIS — R197 Diarrhea, unspecified: Secondary | ICD-10-CM

## 2022-06-08 DIAGNOSIS — Z7189 Other specified counseling: Secondary | ICD-10-CM

## 2022-06-08 DIAGNOSIS — C7972 Secondary malignant neoplasm of left adrenal gland: Secondary | ICD-10-CM | POA: Insufficient documentation

## 2022-06-08 DIAGNOSIS — C3491 Malignant neoplasm of unspecified part of right bronchus or lung: Secondary | ICD-10-CM

## 2022-06-08 DIAGNOSIS — Z87891 Personal history of nicotine dependence: Secondary | ICD-10-CM | POA: Insufficient documentation

## 2022-06-08 DIAGNOSIS — I10 Essential (primary) hypertension: Secondary | ICD-10-CM | POA: Diagnosis not present

## 2022-06-08 DIAGNOSIS — K529 Noninfective gastroenteritis and colitis, unspecified: Secondary | ICD-10-CM | POA: Diagnosis not present

## 2022-06-08 DIAGNOSIS — E2740 Unspecified adrenocortical insufficiency: Secondary | ICD-10-CM | POA: Insufficient documentation

## 2022-06-08 DIAGNOSIS — E876 Hypokalemia: Secondary | ICD-10-CM

## 2022-06-08 DIAGNOSIS — Z800 Family history of malignant neoplasm of digestive organs: Secondary | ICD-10-CM | POA: Diagnosis not present

## 2022-06-08 LAB — COMPREHENSIVE METABOLIC PANEL
ALT: 23 U/L (ref 0–44)
AST: 24 U/L (ref 15–41)
Albumin: 3.1 g/dL — ABNORMAL LOW (ref 3.5–5.0)
Alkaline Phosphatase: 79 U/L (ref 38–126)
Anion gap: 5 (ref 5–15)
BUN: 9 mg/dL (ref 6–20)
CO2: 21 mmol/L — ABNORMAL LOW (ref 22–32)
Calcium: 8.5 mg/dL — ABNORMAL LOW (ref 8.9–10.3)
Chloride: 112 mmol/L — ABNORMAL HIGH (ref 98–111)
Creatinine, Ser: 0.72 mg/dL (ref 0.44–1.00)
GFR, Estimated: >60 mL/min (ref >60)
Glucose, Bld: 142 mg/dL — ABNORMAL HIGH (ref 70–99)
Potassium: 3.5 mmol/L (ref 3.5–5.1)
Sodium: 138 mmol/L (ref 135–145)
Total Bilirubin: 0.4 mg/dL (ref 0.3–1.2)
Total Protein: 6.3 g/dL — ABNORMAL LOW (ref 6.5–8.1)

## 2022-06-08 LAB — CBC WITH DIFFERENTIAL/PLATELET
Abs Immature Granulocytes: 0.06 10*3/uL (ref 0.00–0.07)
Basophils Absolute: 0 10*3/uL (ref 0.0–0.1)
Basophils Relative: 0 %
Eosinophils Absolute: 0.1 10*3/uL (ref 0.0–0.5)
Eosinophils Relative: 2 %
HCT: 37.5 % (ref 36.0–46.0)
Hemoglobin: 12 g/dL (ref 12.0–15.0)
Immature Granulocytes: 1 %
Lymphocytes Relative: 24 %
Lymphs Abs: 2.1 10*3/uL (ref 0.7–4.0)
MCH: 27.3 pg (ref 26.0–34.0)
MCHC: 32 g/dL (ref 30.0–36.0)
MCV: 85.2 fL (ref 80.0–100.0)
Monocytes Absolute: 0.4 10*3/uL (ref 0.1–1.0)
Monocytes Relative: 4 %
Neutro Abs: 6.2 10*3/uL (ref 1.7–7.7)
Neutrophils Relative %: 69 %
Platelets: 303 10*3/uL (ref 150–400)
RBC: 4.4 MIL/uL (ref 3.87–5.11)
RDW: 14.6 % (ref 11.5–15.5)
WBC: 8.9 10*3/uL (ref 4.0–10.5)
nRBC: 0 % (ref 0.0–0.2)

## 2022-06-08 LAB — MAGNESIUM: Magnesium: 2 mg/dL (ref 1.7–2.4)

## 2022-06-08 MED ORDER — LIDOCAINE-PRILOCAINE 2.5-2.5 % EX CREA: TOPICAL_CREAM | CUTANEOUS | 3 refills | Status: DC

## 2022-06-08 MED ORDER — PROCHLORPERAZINE MALEATE 10 MG PO TABS: 10.0000 mg | ORAL_TABLET | Freq: Four times a day (QID) | ORAL | 1 refills | Status: DC | PRN

## 2022-06-08 MED ORDER — FOLIC ACID 1 MG PO TABS: 1.0000 mg | ORAL_TABLET | Freq: Every day | ORAL | 3 refills | Status: DC

## 2022-06-08 MED ORDER — HEPARIN SOD (PORK) LOCK FLUSH 100 UNIT/ML IV SOLN
500.0000 [IU] | Freq: Once | INTRAVENOUS | Status: AC
  Administered 2022-06-08: 500 [IU] via INTRAVENOUS
  Filled 2022-06-08: qty 5

## 2022-06-08 NOTE — Progress Notes (Signed)
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  Original Decision Date/Time: 10/19/2020 09:53   [id number]:Carboplatin AUC=5 + Pemetrexed 500 mg/m2 q21 Days:   A cycle is every 21 days:     Pemetrexed      Carboplatin   **Always confirm dose/schedule in your pharmacy ordering system**  Patient Characteristics: Stage IV Metastatic, Nonsquamous, Initial Chemotherapy/Immunotherapy, PS = 0, 1, ALK or EGFR or ROS1 or NTRK or MET or RET Genomic Alterations - Awaiting Test Results Therapeutic Status: Stage IV Metastatic Histology: Nonsquamous Cell ROS1 Rearrangement Status: Awaiting Test Results Other Mutations/Biomarkers: Awaiting Test Results Chemotherapy/Immunotherapy LOT: Initial Chemotherapy/Immunotherapy Molecular Targeted Therapy: Not Appropriate KRAS G12C Mutation Status: Awaiting Test Results MET Exon 14 Mutation Status: Awaiting Test Results RET Gene Fusion Status: Awaiting Test Results EGFR Mutation Status: Awaiting Test Results NTRK Gene Fusion Status: Awaiting Test Results PD-L1 Expression Status: Awaiting Test Results ALK Rearrangement Status: Awaiting Test Results BRAF V600E Mutation Status: Awaiting Test Results ECOG Performance Status: 1 Biomarker Assessment Status Confirmation: Awaiting genomic biomarker test(s) results and need to start chemotherapy Intent of Therapy: Non-Curative / Palliative Intent, Discussed with Patient

## 2022-06-08 NOTE — Telephone Encounter (Signed)
[facility]ed [facility] back and they states that the authorization is not for enough time for her to get her maintance does also. Informed them once she gets the first does we will work on getting the authorizations for the other. He verbalized understanding and states he would work on getting it dispense to infusion place that she will have the infusion done at.

## 2022-06-08 NOTE — Progress Notes (Signed)
Port has not worked the past three times; does not give any blood.

## 2022-06-08 NOTE — Progress Notes (Signed)
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  She underwent operative fixation on 10/12/2020.  MRI of femur showed heterogeneously enhancing osseous lesion within the area which would be nonspecific versus office neoplasm or metastatic lesion.  CT chest abdomen and pelvis with contrast showed an enlarged pretracheal lymph node 2.2 x 1.5 cm and a right paratracheal lymph node measuring 2.7 x 1.3 cm.  Prevascular node measuring 0.3 x 0.9 cm.  5 x 4 mm right middle lobe nodule.  2.8 x 1.9 cm left adrenal lesion.    Reamings from the right femur showed metastatic poorly differentiated carcinoma.  Immunohistochemistry showed was positive for pancytokeratin, CK7 and patchy CK20 with patchy dim expression of TTF-1.  Cells negative for Melan-A, CDX2, PAX8, Napsin A, GATA3, p40, CD56, p16 and thyroglobulin.  Findings compatible with metastatic carcinoma but because of decalcification immunohistochemical staining is unreliable.  Patchy dim staining with TTF-1 suspicious for lung primary but not a definitive diagnosis.   Repeat supraclavicular lymph node biopsy showed metastatic adenocarcinoma.   Tumor cells positive for CK7 with focal weak staining for TTF-1.  Suggestive of lung origin in the proper clinical context.  Cells were negative for GATA3 PAX8 CDX2 and CK20 and Napsin A.  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  She is currently on hydrocortisone twice daily.  Thyroid functions presently are normal    Interval history-patient reports that her diarrhea is better after treating her C. difficile but has not resolved completely.  It can still come out of nowhere and she needs to plan to have a restroom close by.  ECOG PS- 1 Pain scale- 0   Review of systems- Review of Systems  Constitutional:  Positive for malaise/fatigue. Negative for chills, fever and weight loss.  HENT:  Negative for congestion, ear discharge and nosebleeds.   Eyes:  Negative for blurred vision.  Respiratory:  Negative for cough, hemoptysis, sputum production, shortness of breath and wheezing.   Cardiovascular:  Negative for chest pain, palpitations, orthopnea and claudication.  Gastrointestinal:  Positive for diarrhea. Negative for abdominal pain, blood in stool, constipation, heartburn, melena, nausea and vomiting.  Genitourinary:  Negative for dysuria, flank pain, frequency, hematuria and urgency.  Musculoskeletal:  Negative for back pain, joint pain and myalgias.  Skin:  Negative for rash.  Neurological:  Negative for dizziness, tingling, focal weakness, seizures, weakness and headaches.  Endo/Heme/Allergies:  Does not bruise/bleed easily.  Psychiatric/Behavioral:  Negative for depression and suicidal ideas. The patient does not have insomnia.       Allergies  Allergen Reactions   Factive [Gemifloxacin] Rash     Past Medical History:  Diagnosis Date  Abnormal Pap smear of cervix    Anxiety    Depression    Diverticulosis    Essential hypertension     Femur fracture, right (HCC)    GERD (gastroesophageal reflux disease)    Lung cancer (HCC)    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   Palpitations    Precordial chest pain    Wrist fracture 03/2021   left     Past Surgical History:  Procedure Laterality Date   BREAST BIOPSY Right 2017   benign   CERVICAL BIOPSY  W/ LOOP ELECTRODE EXCISION     COLONOSCOPY WITH PROPOFOL N/A 07/03/2015   Procedure: COLONOSCOPY WITH PROPOFOL;  Surgeon: Hulen Luster, MD;  Location: [facility] ENDOSCOPY;  Service: Gastroenterology;  Laterality: N/A;   COLONOSCOPY WITH PROPOFOL N/A 01/06/2022   Procedure: COLONOSCOPY WITH PROPOFOL;  Surgeon: Lin Landsman, MD;  Location: [facility] ENDOSCOPY;  Service: Gastroenterology;  Laterality: N/A;   ESOPHAGOGASTRODUODENOSCOPY (EGD) WITH PROPOFOL N/A 07/03/2015   Procedure: ESOPHAGOGASTRODUODENOSCOPY (EGD) WITH PROPOFOL;  Surgeon: Hulen Luster, MD;  Location: [facility] ENDOSCOPY;  Service: Gastroenterology;  Laterality: N/A;   FLEXIBLE SIGMOIDOSCOPY N/A 03/12/2022   Procedure: FLEXIBLE SIGMOIDOSCOPY;  Surgeon: Lin Landsman, MD;  Location: [facility] ENDOSCOPY;  Service: Gastroenterology;  Laterality: N/A;   FRACTURE SURGERY     INTRAMEDULLARY (IM) NAIL INTERTROCHANTERIC Right 09/15/2020   Procedure: INTRAMEDULLARY (IM) NAIL INTERTROCHANTRIC;  Surgeon: Corky Mull, MD;  Location: [facility] ORS;  Service: Orthopedics;  Laterality: Right;   IR IMAGING GUIDED PORT INSERTION  11/28/2020   LEFT HEART CATH AND CORONARY ANGIOGRAPHY N/A 03/28/2017   Procedure: Left Heart Cath and Coronary Angiography;  Surgeon: Lorretta Harp, MD;  Location: [place] CV LAB;  Service: Cardiovascular;  Laterality: N/A;   ORIF WRIST FRACTURE Left 03/31/2021   Procedure: OPEN REDUCTION INTERNAL FIXATION (ORIF) LEFT DISTAL RADIUS FRACTURE.;  Surgeon: Corky Mull, MD;  Location: [facility] ORS;  Service: Orthopedics;  Laterality: Left;    Social History   Socioeconomic History   Marital status: Married    Spouse  name: Gene   Number of children: 2   Years of education: Not on file   Highest education level: Not on file  Occupational History   Not on file  Tobacco Use   Smoking status: Former    Packs/day: 1.00    Years: 30.00    Total pack years: 30.00    Types: Cigarettes    Quit date: 09/17/2020    Years since quitting: 1.7   Smokeless tobacco: Never  Vaping Use   Vaping Use: Never used  Substance and Sexual Activity   Alcohol use: Not Currently    Alcohol/week: 1.0 standard drink of alcohol    Types: 1 Standard drinks or equivalent per week    Comment: beer occ.   Drug use: No   Sexual activity: Yes    Birth control/protection: Post-menopausal  Other Topics Concern   Not on file  Social History Narrative   Lives in [place] with her husband.  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   Social Determinants of Health   Financial Resource Strain: Not on file  Food Insecurity: Not on file  Transportation Needs: Not on file  Physical Activity: Not on file  Stress: Not on file  Social Connections: Not on file  Intimate Partner Violence: Not on file    Family History  Problem Relation Age of Onset   Diabetes Mother  alive @ 79   Goiter Mother    Heart disease Father        died of MI @ 87   Osteoporosis Maternal Grandmother    Colon cancer Maternal Grandfather    Breast cancer Neg Hx    Ovarian cancer Neg Hx      Current Outpatient Medications:    ALPRAZolam (XANAX) 0.5 MG tablet, TAKE ONE TABLET DAILY AS NEEDED, Disp: 30 tablet, Rfl: 0   famotidine (PEPCID) 20 MG tablet, Take 20 mg by mouth in the morning., Disp: , Rfl:    metoprolol tartrate (LOPRESSOR) 25 MG tablet, Take 25 mg by mouth in the morning., Disp: , Rfl:    ondansetron (ZOFRAN) 4 MG tablet, TAKE 1 TABLET BY MOUTH EVERY 8 HOURS AS NEEDED FOR NAUSEA AND VOMITING, Disp: 20 tablet, Rfl: 0   oxyCODONE (OXY IR/ROXICODONE) 5 MG immediate release tablet, TAKE 1-2  TABLETS BY MOUTH EVERY 4 HOURS AS NEEDED FOR MODERATE PAIN, Disp: 60 tablet, Rfl: 0   folic acid (FOLVITE) 1 MG tablet, Take 1 tablet (1 mg total) by mouth daily. Start 7 days before pemetrexed chemotherapy. Continue until 21 days after pemetrexed completed., Disp: 100 tablet, Rfl: 3   lidocaine-prilocaine (EMLA) cream, Apply to affected area once, Disp: 30 g, Rfl: 3   mesalamine (ROWASA) 4 g enema, Place 60 mLs (4 g total) rectally at bedtime for 7 doses. (Patient not taking: Reported on 06/08/2022), Disp: 420 mL, Rfl: 0   Pembrolizumab (KEYTRUDA IV), Inject into the vein. Every 3 weeks (Patient not taking: Reported on 06/08/2022), Disp: , Rfl:    PEMEtrexed Disodium (ALIMTA IV), Inject into the vein. Every 3 weeks (Patient not taking: Reported on 06/08/2022), Disp: , Rfl:    prochlorperazine (COMPAZINE) 10 MG tablet, Take 1 tablet (10 mg total) by mouth every 6 (six) hours as needed for nausea or vomiting., Disp: 30 tablet, Rfl: 1 No current facility-administered medications for this visit.  Facility-Administered Medications Ordered in Other Visits:    sodium chloride flush (NS) 0.9 % injection 10 mL, 10 mL, Intravenous, PRN, Sindy Guadeloupe, MD, 10 mL at 03/09/21 0906  Physical exam:  Vitals:   06/08/22 1331  BP: 118/84  Pulse: 81  Resp: 18  Temp: 98.1 F (36.7 C)  SpO2: 99%  Weight: 197 lb 3.2 oz (89.4 kg)   Physical Exam Constitutional:      General: She is not in acute distress. Cardiovascular:     Rate and Rhythm: Normal rate and regular rhythm.     Heart sounds: Normal heart sounds.  Pulmonary:     Effort: Pulmonary effort is normal.     Breath sounds: Normal breath sounds.  Skin:    General: Skin is warm and dry.  Neurological:     Mental Status: She is alert and oriented to person, place, and time.         Latest Ref Rng & Units 06/08/2022    1:04 PM  CMP  Glucose 70 - 99 mg/dL 142   BUN 6 - 20 mg/dL 9   Creatinine 0.44 - 1.00 mg/dL 0.72   Sodium 135 - 145 mmol/L  138   Potassium 3.5 - 5.1 mmol/L 3.5   Chloride 98 - 111 mmol/L 112   CO2 22 - 32 mmol/L 21   Calcium 8.9 - 10.3 mg/dL 8.5   Total Protein 6.5 - 8.1 g/dL 6.3   Total Bilirubin 0.3 - 1.2 mg/dL 0.4   Alkaline Phos 38 - 126 U/L  79   AST 15 - 41 U/L 24   ALT 0 - 44 U/L 23       Latest Ref Rng & Units 06/08/2022    1:04 PM  CBC  WBC 4.0 - 10.5 K/uL 8.9   Hemoglobin 12.0 - 15.0 g/dL 12.0   Hematocrit 36.0 - 46.0 % 37.5   Platelets 150 - 400 K/uL 303     No images are attached to the encounter.  CT CHEST ABDOMEN PELVIS W CONTRAST  Result Date: 06/04/2022 CLINICAL DATA:  Restaging right lung cancer diagnosed in 2021. Left adrenal and right femur metastasis. Nodal metastasis. Stopped taking immunotherapy due to diarrhea. Ex-smoker. * Tracking Code: BO * EXAM: CT CHEST, ABDOMEN, AND PELVIS WITH CONTRAST TECHNIQUE: Multidetector CT imaging of the chest, abdomen and pelvis was performed following the standard protocol during bolus administration of intravenous contrast. RADIATION DOSE REDUCTION: This exam was performed according to the departmental dose-optimization program which includes automated exposure control, adjustment of the mA and/or kV according to patient size and/or use of iterative reconstruction technique. CONTRAST:  177m OMNIPAQUE IOHEXOL 300 MG/ML  SOLN COMPARISON:  02/23/2022 FINDINGS: CT CHEST FINDINGS Cardiovascular: Right Port-A-Cath tip high right atrium. Aortic atherosclerosis. Normal heart size, without pericardial effusion. Lad and right coronary artery calcification. No central pulmonary embolism, on this non-dedicated study. Mediastinum/Nodes: No supraclavicular adenopathy. 1.2 cm right-sided thyroid nodule is similar to on the prior. Not clinically significant; no follow-up imaging recommended (ref: J Am Coll Radiol. 2015 Feb;12(2): 143-50) Right paratracheal adenopathy at 2.1 cm on 18/3 versus 0.9 cm on the prior exam. Prevascular 1.0 cm node on 20/3 is enlarged from 2-3 mm  on the prior. No hilar adenopathy. Lungs/Pleura: No pleural fluid. No change in minimal right middle lobe scarring laterally, including on 101/4. A nodule on the superior portion of the left major fissure measures 4 mm on 62/4 and is new. Musculoskeletal: Ninth lateral left rib irregularity on 47/3 is similar present back to 10/02/2020 PET and not FDG avid on that exam. CT ABDOMEN PELVIS FINDINGS Hepatobiliary: Hepatic dome subcentimeter cyst. Focal steatosis adjacent the falciform ligament. Normal gallbladder, without biliary ductal dilatation. Pancreas: Normal, without mass or ductal dilatation. Spleen: Normal in size, without focal abnormality. Adrenals/Urinary Tract: Left adrenal calcification. No residual or recurrent mass. Normal right adrenal gland. Normal right kidney. Interpolar left renal 1.0 cm lesion is similar in size measures slightly greater than fluid density including on 80/3. No hydronephrosis. Degraded evaluation of the pelvis, secondary to beam hardening artifact from right proximal femur hardware. Normal urinary bladder. Stomach/Bowel: Normal stomach, without wall thickening. Descending duodenal diverticulum. Otherwise normal small bowel. Mild colonic wall thickening, accentuated by underdistention. Relatively diffuse with sparing of the cecum and proximal ascending colon. Mild pericolonic edema. Scattered colonic diverticula. Vascular/Lymphatic: Aortic atherosclerosis. No abdominopelvic adenopathy. Reproductive: Normal uterus and adnexa. Other: Moderate pelvic floor laxity. No significant free fluid. No free intraperitoneal air. No evidence of omental or peritoneal disease. Musculoskeletal: Incompletely imaged expansile, partially lytic and partially sclerotic proximal right femur lesion is similar to on the prior. Primarily sclerotic left iliac wing lesion including at on the order of 4.5 cm on 107/3 is felt to be similar to on the prior. No new osseous lesion. Trace L3-4 anterolisthesis.  IMPRESSION: CT CHEST IMPRESSION 1. Development of middle and anterior mediastinal adenopathy, consistent with nodal progression. 2. New nodule along the right minor fissure at 4 mm which could represent a subpleural lymph node or isolated pulmonary metastasis. 3. Coronary artery  atherosclerosis. Aortic Atherosclerosis (ICD10-I70.0). 4. Chronic left 9th rib deformity, favored to be due to remote trauma. CT ABDOMEN AND PELVIS IMPRESSION 1. Similar osseous metastasis in the right femur and left iliac wing. 2. No soft tissue metastasis within the abdomen or pelvis. 3. Mild diffuse colitis which could be infectious or inflammatory. 4. 1.0 cm left renal lesion is most likely a minimally complex cyst but technically indeterminate. Recommend attention on follow-up. 5. Pelvic floor laxity. Electronically Signed   By: Abigail Miyamoto M.D.   On: 06/04/2022 08:37     Assessment and plan- Patient is a 56 y.o. female with metastatic adenocarcinoma of the lung here to discuss CT scan results and further management  Patient last received single agent Alimta in June 2023.  She has not received Keytruda since April 2023 due to autoimmune colitis.I have reviewed CT chest abdomen and pelvis images independently and discussed findings with the patient which shows some progression of mediastinal adenopathy as well as new nodule in the right minor fissure which could represent an isolated pulmonary metastases.  No other evidence of distant metastatic disease.  I do feel that we need to restart her systemic treatment for her stage IV lung cancer at this time.  She will never be rechallenged with any immunotherapy at this time but it would be reasonable to start single agent Alimta.She will directly proceed for Alimta in 1 week.  She will see covering NP 07/07/2022 for labs and perjeta and I will see her back in 8 weeks for the following cycle.  She will be seen about 2 weeks from today by covering NP for possible IV  fluids.  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  She is going to be getting a repeat flexible sigmoidoscopy to decide about change of treatment from Entyvio to Remicade.  Patient will receive B12 injection on 07/07/2022.She she will also restart Zometa on that day if calcium levels are greater than 8.5.   Visit Diagnosis 1. Primary malignant neoplasm of right lung metastatic to other site [facility])   2. Colitis   3. Goals of care, counseling/discussion      Dr. Randa Evens, MD, MPH [facility] at [facility] [phone] 06/08/2022 4:36 PM

## 2022-06-08 NOTE — Telephone Encounter (Signed)
[facility]  [place]ada Dy [phone] has a question about preauth. Left message

## 2022-06-09 ENCOUNTER — Other Ambulatory Visit: Payer: Self-pay | Admitting: Oncology

## 2022-06-09 ENCOUNTER — Other Ambulatory Visit: Payer: Self-pay

## 2022-06-10 ENCOUNTER — Other Ambulatory Visit: Payer: Self-pay

## 2022-06-10 ENCOUNTER — Encounter: Payer: Self-pay | Admitting: Gastroenterology

## 2022-06-11 ENCOUNTER — Encounter
Admission: RE | Disposition: A | Discharge status: Home / Self Care | Payer: Self-pay | Source: Home / Self Care | Attending: Gastroenterology

## 2022-06-11 ENCOUNTER — Ambulatory Visit: Payer: 59 | Admitting: Certified Registered Nurse Anesthetist

## 2022-06-11 ENCOUNTER — Encounter: Payer: Self-pay | Admitting: Gastroenterology

## 2022-06-11 ENCOUNTER — Ambulatory Visit
Admission: RE | Admit: 2022-06-11 | Discharge: 2022-06-11 | Disposition: A | Discharge status: Home / Self Care | Payer: 59 | Attending: Gastroenterology | Admitting: Gastroenterology

## 2022-06-11 DIAGNOSIS — K219 Gastro-esophageal reflux disease without esophagitis: Secondary | ICD-10-CM | POA: Insufficient documentation

## 2022-06-11 DIAGNOSIS — K529 Noninfective gastroenteritis and colitis, unspecified: Secondary | ICD-10-CM | POA: Insufficient documentation

## 2022-06-11 DIAGNOSIS — F32A Depression, unspecified: Secondary | ICD-10-CM | POA: Insufficient documentation

## 2022-06-11 DIAGNOSIS — D120 Benign neoplasm of cecum: Secondary | ICD-10-CM | POA: Diagnosis not present

## 2022-06-11 DIAGNOSIS — K523 Indeterminate colitis: Secondary | ICD-10-CM | POA: Diagnosis not present

## 2022-06-11 DIAGNOSIS — D122 Benign neoplasm of ascending colon: Secondary | ICD-10-CM | POA: Diagnosis not present

## 2022-06-11 DIAGNOSIS — F419 Anxiety disorder, unspecified: Secondary | ICD-10-CM | POA: Insufficient documentation

## 2022-06-11 DIAGNOSIS — I10 Essential (primary) hypertension: Secondary | ICD-10-CM | POA: Diagnosis not present

## 2022-06-11 DIAGNOSIS — E785 Hyperlipidemia, unspecified: Secondary | ICD-10-CM | POA: Diagnosis not present

## 2022-06-11 DIAGNOSIS — Z87891 Personal history of nicotine dependence: Secondary | ICD-10-CM | POA: Insufficient documentation

## 2022-06-11 DIAGNOSIS — R69 Illness, unspecified: Secondary | ICD-10-CM | POA: Diagnosis not present

## 2022-06-11 DIAGNOSIS — K644 Residual hemorrhoidal skin tags: Secondary | ICD-10-CM | POA: Insufficient documentation

## 2022-06-11 HISTORY — PX: COLONOSCOPY: SHX5424

## 2022-06-11 SURGERY — COLONOSCOPY
Anesthesia: General

## 2022-06-11 MED ORDER — LIDOCAINE HCL (CARDIAC) PF 100 MG/5ML IV SOSY
PREFILLED_SYRINGE | INTRAVENOUS | Status: DC | PRN
  Administered 2022-06-11: 50 mg via INTRAVENOUS

## 2022-06-11 MED ORDER — SODIUM CHLORIDE 0.9 % IV SOLN: INTRAVENOUS | Status: DC

## 2022-06-11 MED ORDER — ONDANSETRON HCL 4 MG/2ML IJ SOLN
INTRAMUSCULAR | Status: DC | PRN
  Administered 2022-06-11: 4 mg via INTRAVENOUS

## 2022-06-11 MED ORDER — PROPOFOL 10 MG/ML IV BOLUS
INTRAVENOUS | Status: DC | PRN
  Administered 2022-06-11: 70 mg via INTRAVENOUS
  Administered 2022-06-11: 30 mg via INTRAVENOUS

## 2022-06-11 MED ORDER — DEXMEDETOMIDINE HCL IN NACL 80 MCG/20ML IV SOLN
INTRAVENOUS | Status: DC | PRN
  Administered 2022-06-11 (×2): 4 ug via BUCCAL

## 2022-06-11 MED ORDER — PROPOFOL 1000 MG/100ML IV EMUL
INTRAVENOUS | Status: AC
  Filled 2022-06-11: qty 100

## 2022-06-11 MED ORDER — PROPOFOL 500 MG/50ML IV EMUL
INTRAVENOUS | Status: DC | PRN
  Administered 2022-06-11: 140 ug/kg/min via INTRAVENOUS

## 2022-06-11 NOTE — Transfer of Care (Signed)
Immediate Anesthesia Transfer of Care Note  Patient: Gina Sanchez  Procedure(s) Performed: FLEXIBLE SIGMOIDOSCOPY  Patient Location: PACU and Endoscopy Unit  Anesthesia Type:General  Level of Consciousness: awake  Airway & Oxygen Therapy: Patient Spontanous Breathing  Post-op Assessment: Report given to RN and Post -op Vital signs reviewed and stable  Post vital signs: Reviewed and stable  Last Vitals:  Vitals Value Taken Time  BP 104/71 06/11/22 1206  Temp 35.6 C 06/11/22 1206  Pulse 78 06/11/22 1206  Resp 18 06/11/22 1206  SpO2 96 % 06/11/22 1206    Last Pain:  Vitals:   06/11/22 1206  TempSrc: Temporal  PainSc: 0-No pain         Complications: No notable events documented.

## 2022-06-11 NOTE — Anesthesia Procedure Notes (Signed)
Date/Time: 06/11/2022 11:29 AM  Performed by: Lily Peer, Kortland Nichols, CRNAPre-anesthesia Checklist: Patient identified, Emergency Drugs available, Suction available, Patient being monitored and Timeout performed Patient Re-evaluated:Patient Re-evaluated prior to induction Oxygen Delivery Method: Nasal cannula Induction Type: IV induction

## 2022-06-11 NOTE — H&P (Signed)
Cephas Darby, MD [address]  [place]  [place], [place] [address]  Main: [phone]  Fax: [phone] Pager: [phone]  Primary Care Physician:  Remi Haggard, FNP Primary Gastroenterologist:  Dr. Cephas Darby  Pre-Procedure History & Physical: HPI:  Gina Sanchez is a 56 y.o. female is here for an flexible sigmoidoscopy.   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   Palpitations    Precordial chest pain    Wrist fracture 03/2021   left    Past Surgical History:  Procedure Laterality Date   BREAST BIOPSY Right 2017   benign   CERVICAL BIOPSY  W/ LOOP ELECTRODE EXCISION     COLONOSCOPY WITH PROPOFOL N/A 07/03/2015   Procedure: COLONOSCOPY WITH PROPOFOL;  Surgeon: Hulen Luster, MD;  Location: [facility] ENDOSCOPY;  Service: Gastroenterology;  Laterality: N/A;   COLONOSCOPY WITH PROPOFOL N/A 01/06/2022   Procedure: COLONOSCOPY WITH PROPOFOL;  Surgeon: Lin Landsman, MD;  Location: [facility] ENDOSCOPY;  Service: Gastroenterology;  Laterality: N/A;   ESOPHAGOGASTRODUODENOSCOPY (EGD) WITH PROPOFOL N/A 07/03/2015   Procedure: ESOPHAGOGASTRODUODENOSCOPY (EGD) WITH PROPOFOL;  Surgeon: Hulen Luster, MD;  Location: [facility] ENDOSCOPY;  Service: Gastroenterology;  Laterality: N/A;   FLEXIBLE SIGMOIDOSCOPY N/A 03/12/2022   Procedure: FLEXIBLE SIGMOIDOSCOPY;  Surgeon: Lin Landsman, MD;  Location: [facility] ENDOSCOPY;  Service: Gastroenterology;  Laterality: N/A;   FRACTURE SURGERY     INTRAMEDULLARY (IM) NAIL INTERTROCHANTERIC Right 09/15/2020   Procedure: INTRAMEDULLARY (IM) NAIL INTERTROCHANTRIC;  Surgeon: Corky Mull, MD;  Location: [facility] ORS;  Service: Orthopedics;  Laterality: Right;   IR IMAGING GUIDED PORT INSERTION  11/28/2020   LEFT  HEART CATH AND CORONARY ANGIOGRAPHY N/A 03/28/2017   Procedure: Left Heart Cath and Coronary Angiography;  Surgeon: Lorretta Harp, MD;  Location: [place] CV LAB;  Service: Cardiovascular;  Laterality: N/A;   ORIF WRIST FRACTURE Left 03/31/2021   Procedure: OPEN REDUCTION INTERNAL FIXATION (ORIF) LEFT DISTAL RADIUS FRACTURE.;  Surgeon: Corky Mull, MD;  Location: [facility] ORS;  Service: Orthopedics;  Laterality: Left;    Prior to Admission medications   Medication Sig Start Date End Date Taking? Authorizing Provider  ALPRAZolam Duanne Moron) 0.5 MG tablet TAKE ONE TABLET DAILY AS NEEDED 05/13/22   Sindy Guadeloupe, MD  famotidine (PEPCID) 20 MG tablet Take 20 mg by mouth in the morning.    [provider]  folic acid (FOLVITE) 1 MG tablet Take 1 tablet (1 mg total) by mouth daily. Start 7 days before pemetrexed chemotherapy. Continue until 21 days after pemetrexed completed. 06/08/22   Sindy Guadeloupe, MD  hydrocortisone (CORTEF) 20 MG tablet TAKE 1 TABLET BY MOUTH EVERY MORNING 06/09/22   Sindy Guadeloupe, MD  lidocaine-prilocaine (EMLA) cream Apply to affected area once 06/08/22   Sindy Guadeloupe, MD  mesalamine (ROWASA) 4 g enema Place 60 mLs (4 g total) rectally at bedtime for 7 doses. Patient not taking: Reported on 06/08/2022 04/22/22 05/04/22  Lin Landsman, MD  metoprolol tartrate (LOPRESSOR) 25 MG tablet Take 25 mg by mouth in the morning. 11/11/20   [provider]  ondansetron (ZOFRAN) 4 MG tablet TAKE 1 TABLET BY MOUTH EVERY 8 HOURS AS  NEEDED FOR NAUSEA AND VOMITING 04/30/22   Hughie Closs, PA-C  oxyCODONE (OXY IR/ROXICODONE) 5 MG immediate release tablet TAKE 1-2 TABLETS BY MOUTH EVERY 4 HOURS AS NEEDED FOR MODERATE PAIN 04/15/22   Sindy Guadeloupe, MD  Pembrolizumab [facility] IV) Inject into the vein. Every 3 weeks Patient not taking: Reported on 06/08/2022    [provider]  PEMEtrexed Disodium (ALIMTA IV) Inject into the vein. Every 3 weeks Patient not taking:  Reported on 06/08/2022    [provider]  prochlorperazine (COMPAZINE) 10 MG tablet Take 1 tablet (10 mg total) by mouth every 6 (six) hours as needed for nausea or vomiting. 06/08/22   Sindy Guadeloupe, MD    Allergies as of 06/02/2022 - Review Complete 05/07/2022  Allergen Reaction Noted   Factive [gemifloxacin] Rash 07/03/2015    Family History  Problem Relation Age of Onset   Diabetes Mother        alive @ 80   Goiter Mother    Heart disease Father        died of MI @ 37   Osteoporosis Maternal Grandmother    Colon cancer Maternal Grandfather    Breast cancer Neg Hx    Ovarian cancer Neg Hx     Social History   Socioeconomic History   Marital status: Married    Spouse name: Gene   Number of children: 2   Years of education: Not on file   Highest education level: Not on file  Occupational History   Not on file  Tobacco Use   Smoking status: Former    Packs/day: 1.00    Years: 30.00    Total pack years: 30.00    Types: Cigarettes    Quit date: 09/17/2020    Years since quitting: 1.7   Smokeless tobacco: Never  Vaping Use   Vaping Use: Never used  Substance and Sexual Activity   Alcohol use: Not Currently    Alcohol/week: 1.0 standard drink of alcohol    Types: 1 Standard drinks or equivalent per week    Comment: beer occ.   Drug use: No   Sexual activity: Yes    Birth control/protection: Post-menopausal  Other Topics Concern   Not on file  Social History Narrative   Lives in [place] with her husband.  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   Social Determinants of Health   Financial Resource Strain: Not on file  Food Insecurity: Not on file  Transportation Needs: Not on file  Physical Activity: Not on file  Stress: Not on file  Social Connections: Not on file  Intimate Partner Violence: Not on file    Review of Systems: See HPI, otherwise negative ROS  Physical Exam: BP (!) 130/98   Pulse 78    Temp (!) 97.1 F (36.2 C) (Temporal)   Resp 16   Ht 5' 6"  (1.676 m)   Wt 87.1 kg   LMP 09/15/2018   SpO2 100%   BMI 30.99 kg/m  General:   Alert,  pleasant and cooperative in NAD Head:  Normocephalic and atraumatic. Neck:  Supple; no masses or thyromegaly. Lungs:  Clear throughout to auscultation.    Heart:  Regular rate and rhythm. Abdomen:  Soft, nontender and nondistended. Normal bowel sounds, without guarding, and without rebound.   Neurologic:  Alert and  oriented x4;  grossly normal neurologically.  Impression/Plan: Gina Sanchez is here for an flexible sigmoidoscopy to be performed  for follow up of immune mediated colitis  Risks, benefits, limitations, and alternatives regarding  flexible sigmoidoscopy have been reviewed with the patient.  Questions have been answered.  All parties agreeable.   Sherri Sear, MD  06/11/2022, 11:17 AM

## 2022-06-11 NOTE — Anesthesia Preprocedure Evaluation (Signed)
Anesthesia Evaluation  Patient identified by MRN, date of birth, ID band Patient awake    Reviewed: Allergy & Precautions, NPO status , Patient's Chart, lab work & pertinent test results  History of Anesthesia Complications Negative for: history of anesthetic complications  Airway Mallampati: III  TM Distance: >3 FB Neck ROM: full    Dental  (+) Dental Advidsory Given, Poor Dentition   Pulmonary neg pulmonary ROS, neg sleep apnea, neg COPD, former smoker,    Pulmonary exam normal        Cardiovascular hypertension, (-) anginanegative cardio ROS Normal cardiovascular exam     Neuro/Psych PSYCHIATRIC DISORDERS negative neurological ROS     GI/Hepatic negative GI ROS, Neg liver ROS,   Endo/Other  negative endocrine ROS  Renal/GU negative Renal ROS  negative genitourinary   Musculoskeletal   Abdominal   Peds  Hematology negative hematology ROS (+)   Anesthesia Other Findings Past Medical History: No date: Abnormal Pap smear of cervix No date: Anxiety No date: Depression No date: Diverticulosis No date: Essential hypertension No date: Femur fracture, right (HCC) No date: GERD (gastroesophageal reflux disease) No date: 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 No date: Palpitations No date: Precordial chest pain 03/2021: Wrist fracture     Comment:  left  Past Surgical History: 2017: BREAST BIOPSY; Right     Comment:  benign No date: CERVICAL BIOPSY  W/ LOOP ELECTRODE EXCISION 07/03/2015: COLONOSCOPY WITH PROPOFOL; N/A     Comment:  Procedure: COLONOSCOPY WITH PROPOFOL;  Surgeon: Hulen Luster, MD;  Location: [facility] ENDOSCOPY;  Service:               Gastroenterology;  Laterality: N/A; 01/06/2022: COLONOSCOPY WITH PROPOFOL; N/A     Comment:  Procedure: COLONOSCOPY WITH PROPOFOL;  Surgeon: Lin Landsman, MD;  Location: [facility] ENDOSCOPY;  Service:                Gastroenterology;  Laterality: N/A; 07/03/2015: ESOPHAGOGASTRODUODENOSCOPY (EGD) WITH PROPOFOL; N/A     Comment:  Procedure: ESOPHAGOGASTRODUODENOSCOPY (EGD) WITH               PROPOFOL;  Surgeon: Hulen Luster, MD;  Location: [facility]               ENDOSCOPY;  Service: Gastroenterology;  Laterality: N/A; 03/12/2022: FLEXIBLE SIGMOIDOSCOPY; N/A     Comment:  Procedure: FLEXIBLE SIGMOIDOSCOPY;  Surgeon: Lin Landsman, MD;  Location: [facility] ENDOSCOPY;  Service:               Gastroenterology;  Laterality: N/A; No date: FRACTURE SURGERY 09/15/2020: INTRAMEDULLARY (IM) NAIL INTERTROCHANTERIC; Right     Comment:  Procedure: INTRAMEDULLARY (IM) NAIL INTERTROCHANTRIC;                Surgeon: Corky Mull, MD;  Location: [facility] ORS;                Service: Orthopedics;  Laterality: Right; 11/28/2020: IR IMAGING GUIDED PORT INSERTION 03/28/2017: LEFT HEART CATH AND CORONARY ANGIOGRAPHY; N/A     Comment:  Procedure: Left Heart Cath and Coronary Angiography;                Surgeon: Lorretta Harp, MD;  Location: [facility]              LAB;  Service: Cardiovascular;  Laterality: N/A; 03/31/2021: ORIF WRIST FRACTURE; Left     Comment:  Procedure: OPEN REDUCTION INTERNAL FIXATION (ORIF) LEFT               DISTAL RADIUS FRACTURE.;  Surgeon: Corky Mull, MD;                Location: [facility] ORS;  Service: Orthopedics;  Laterality:               Left;  BMI    Body Mass Index: 30.99 kg/m      Reproductive/Obstetrics negative OB ROS                             Anesthesia Physical Anesthesia Plan  ASA: 2  Anesthesia Plan: General   Post-op Pain Management: Minimal or no pain anticipated   Induction: Intravenous  PONV Risk Score and Plan: 3 and Propofol infusion, TIVA and Ondansetron  Airway Management Planned: Nasal Cannula  Additional Equipment: None  Intra-op Plan:   Post-operative Plan:   Informed Consent: I have reviewed the patients  History and Physical, chart, labs and discussed the procedure including the risks, benefits and alternatives for the proposed anesthesia with the patient or authorized representative who has indicated his/her understanding and acceptance.     Dental advisory given  Plan Discussed with: CRNA and Surgeon  Anesthesia Plan Comments: (Discussed risks of anesthesia with patient, including possibility of difficulty with spontaneous ventilation under anesthesia necessitating airway intervention, PONV, and rare risks such as cardiac or respiratory or neurological events, and allergic reactions. Discussed the role of CRNA in patient's perioperative care. Patient understands.)        Anesthesia Quick Evaluation

## 2022-06-11 NOTE — Op Note (Signed)
[facility] Gastroenterology Patient Name: Gina Sanchez Procedure Date: 06/11/2022 11:28 AM MRN: [id number] Account #: [id number] Date of Birth: 1966-06-28 Admit Type: Outpatient Age: 56 Room: [facility] ENDO ROOM 2 Gender: Female Note Status: Finalized Instrument Name: Upper Endoscope [id number] Procedure:             Colonoscopy Indications:           Follow-up of immune mediated colitis Providers:             Lin Landsman MD, MD Referring MD:          Jordan Likes. Lavena Bullion (Referring MD) Medicines:             General Anesthesia Complications:         No immediate complications. Estimated blood loss:                         Minimal. Procedure:             Pre-Anesthesia Assessment:                        - Prior to the procedure, a History and Physical was                         performed, and patient medications and allergies were                         reviewed. The patient is competent. The risks and                         benefits of the procedure and the sedation options and                         risks were discussed with the patient. All questions                         were answered and informed consent was obtained.                         Patient identification and proposed procedure were                         verified by the physician, the nurse, the                         anesthesiologist, the anesthetist and the technician                         in the pre-procedure area in the procedure room in the                         endoscopy suite. Mental Status Examination: alert and                         oriented. Airway Examination: normal oropharyngeal                         airway and neck mobility. Respiratory Examination:  clear to auscultation. CV Examination: normal.                         Prophylactic Antibiotics: The patient does not require                         prophylactic antibiotics. Prior Anticoagulants: The                          patient has taken no previous anticoagulant or                         antiplatelet agents. ASA Grade Assessment: III - A                         patient with severe systemic disease. After reviewing                         the risks and benefits, the patient was deemed in                         satisfactory condition to undergo the procedure. The                         anesthesia plan was to use general anesthesia.                         Immediately prior to administration of medications,                         the patient was re-assessed for adequacy to receive                         sedatives. The heart rate, respiratory rate, oxygen                         saturations, blood pressure, adequacy of pulmonary                         ventilation, and response to care were monitored                         throughout the procedure. The physical status of the                         patient was re-assessed after the procedure.                        After obtaining informed consent, the colonoscope was                         passed under direct vision. Throughout the procedure,                         the patient's blood pressure, pulse, and oxygen                         saturations were monitored continuously. The Endoscope  was introduced through the anus and advanced to the                         the terminal ileum, with identification of the                         appendiceal orifice and IC valve. The colonoscopy was                         performed without difficulty. The patient tolerated                         the procedure well. The quality of the bowel                         preparation was adequate. Findings:      The perianal and digital rectal examinations were normal. Pertinent       negatives include normal sphincter tone and no palpable rectal lesions.      Skin tags were found on perianal exam.      The terminal  ileum appeared normal.      Normal mucosa was found in the transverse colon, in the ascending colon       and in the cecum. Biopsies were taken with a cold forceps for histology.      Diffuse mild inflammation characterized by congestion (edema),       friability and mucus was found in the rectum, in the recto-sigmoid       colon, in the sigmoid colon and in the descending colon. Biopsies were       taken with a cold forceps for histology. Estimated blood loss was       minimal. Impression:            - Perianal skin tags found on perianal exam.                        - The examined portion of the ileum was normal.                        - Normal mucosa in the transverse colon, in the                         ascending colon and in the cecum. Biopsied.                        - Diffuse mild inflammation was found in the rectum,                         in the recto-sigmoid colon, in the sigmoid colon and                         in the descending colon secondary to colitis. Biopsied. Recommendation:        - Discharge patient to home (with spouse).                        - Resume previous diet and lactose free diet today.                        -  Continue present medications.                        - Await pathology results.                        - Continue entyvio every 8 weeks Procedure Code(s):     --- Professional ---                        [id number], Colonoscopy, flexible; with biopsy, single or                         multiple Diagnosis Code(s):     --- Professional ---                        K52.9, Noninfective gastroenteritis and colitis,                         unspecified                        K64.4, Residual hemorrhoidal skin tags CPT copyright 2019 American Medical Association. All rights reserved. The codes documented in this report are preliminary and upon coder review may  be revised to meet current compliance requirements. Dr. Ulyess Mort Lin Landsman MD, MD 06/11/2022  12:02:25 PM This report has been signed electronically. Number of Addenda: 0 Note Initiated On: 06/11/2022 11:28 AM Total Procedure Duration: 0 hours 22 minutes 11 seconds  Estimated Blood Loss:  Estimated blood loss was minimal.      [facility]

## 2022-06-12 NOTE — Anesthesia Postprocedure Evaluation (Signed)
Anesthesia Post Note  Patient: Gina Sanchez  Procedure(s) Performed: [place]  Patient location during evaluation: Endoscopy Anesthesia Type: General Level of consciousness: awake and alert Pain management: pain level controlled Vital Signs Assessment: post-procedure vital signs reviewed and stable Respiratory status: spontaneous breathing, nonlabored ventilation, respiratory function stable and patient connected to nasal cannula oxygen Cardiovascular status: blood pressure returned to baseline and stable Postop Assessment: no apparent nausea or vomiting Anesthetic complications: no   No notable events documented.   Last Vitals:  Vitals:   06/11/22 1216 06/11/22 1222  BP: 97/74 106/83  Pulse: 78   Resp: 15   Temp:    SpO2: 98%     Last Pain:  Vitals:   06/11/22 1226  TempSrc:   PainSc: 0-No pain                 Dimas Millin

## 2022-06-14 ENCOUNTER — Telehealth: Payer: Self-pay

## 2022-06-14 ENCOUNTER — Ambulatory Visit: Payer: 59 | Admitting: Gastroenterology

## 2022-06-14 ENCOUNTER — Encounter: Payer: Self-pay | Admitting: Gastroenterology

## 2022-06-14 ENCOUNTER — Other Ambulatory Visit: Payer: Self-pay

## 2022-06-14 NOTE — Telephone Encounter (Signed)
[facility] at [phone] and talk to  [facility] and she canceled the Infliximab and she will be looking for the fax for [facility]

## 2022-06-14 NOTE — Telephone Encounter (Signed)
-----   Message from Lin Landsman, MD sent at 06/11/2022 12:09 PM EDT ----- Regarding: Gina Sanchez Please apply for entyvio, every 8 weeks, urgent Dx: immune mediated colitis  RV

## 2022-06-14 NOTE — Telephone Encounter (Signed)
Filled out infusion form and [facility]ed [facility]. Did the PA on [facility] for the [facility].   Certificate Information Reference Number [id number]  Status PENDED  Review Reason 1 Requires Medical Review  Message REVIEW RESPONSE MESSAGE FOR ALL SERVICES BELOW AS THERE MAY BE REFERRAL(S) TO OTHER SERVICE PARTNERS. FOR SERVICES IN PENDED STATUS ADDITIONAL INFORMATION MAY BE NEEDED  Member Information Patient Name Gina Sanchez, Gina Sanchez  Patient Date of Birth 1966-06-12  Patient Gender Female  Member ID [id number]  Relationship to [place] Name [name], [name]  Requesting Provider    Name [place], [place] [id number]  Provider Role Provider  Address [address] 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

## 2022-06-15 MED FILL — Dexamethasone Sodium Phosphate Inj 100 MG/10ML: INTRAMUSCULAR | Qty: 1 | Status: AC

## 2022-06-15 NOTE — Telephone Encounter (Signed)
Called [phone] to check on the referral and they state the PA is still pending clinical review expected date is 06/17/2022 PA number is [id number]

## 2022-06-16 ENCOUNTER — Inpatient Hospital Stay: Payer: 59

## 2022-06-16 ENCOUNTER — Inpatient Hospital Stay: Payer: 59 | Attending: Oncology

## 2022-06-16 ENCOUNTER — Other Ambulatory Visit: Payer: Self-pay

## 2022-06-16 DIAGNOSIS — Z5111 Encounter for antineoplastic chemotherapy: Secondary | ICD-10-CM | POA: Insufficient documentation

## 2022-06-16 DIAGNOSIS — Z79899 Other long term (current) drug therapy: Secondary | ICD-10-CM | POA: Diagnosis not present

## 2022-06-16 DIAGNOSIS — K529 Noninfective gastroenteritis and colitis, unspecified: Secondary | ICD-10-CM

## 2022-06-16 DIAGNOSIS — C3491 Malignant neoplasm of unspecified part of right bronchus or lung: Secondary | ICD-10-CM | POA: Diagnosis not present

## 2022-06-16 LAB — COMPREHENSIVE METABOLIC PANEL
ALT: 16 U/L (ref 0–44)
AST: 20 U/L (ref 15–41)
Albumin: 3 g/dL — ABNORMAL LOW (ref 3.5–5.0)
Alkaline Phosphatase: 70 U/L (ref 38–126)
Anion gap: 6 (ref 5–15)
BUN: 8 mg/dL (ref 6–20)
CO2: 26 mmol/L (ref 22–32)
Calcium: 8.4 mg/dL — ABNORMAL LOW (ref 8.9–10.3)
Chloride: 107 mmol/L (ref 98–111)
Creatinine, Ser: 0.61 mg/dL (ref 0.44–1.00)
GFR, Estimated: >60 mL/min (ref >60)
Glucose, Bld: 119 mg/dL — ABNORMAL HIGH (ref 70–99)
Potassium: 3.3 mmol/L — ABNORMAL LOW (ref 3.5–5.1)
Sodium: 139 mmol/L (ref 135–145)
Total Bilirubin: 0.6 mg/dL (ref 0.3–1.2)
Total Protein: 6 g/dL — ABNORMAL LOW (ref 6.5–8.1)

## 2022-06-16 LAB — CBC WITH DIFFERENTIAL/PLATELET
Abs Immature Granulocytes: 0.11 10*3/uL — ABNORMAL HIGH (ref 0.00–0.07)
Basophils Absolute: 0.1 10*3/uL (ref 0.0–0.1)
Basophils Relative: 1 %
Eosinophils Absolute: 0.2 10*3/uL (ref 0.0–0.5)
Eosinophils Relative: 2 %
HCT: 37.1 % (ref 36.0–46.0)
Hemoglobin: 12.1 g/dL (ref 12.0–15.0)
Immature Granulocytes: 1 %
Lymphocytes Relative: 34 %
Lymphs Abs: 3.7 10*3/uL (ref 0.7–4.0)
MCH: 27.9 pg (ref 26.0–34.0)
MCHC: 32.6 g/dL (ref 30.0–36.0)
MCV: 85.5 fL (ref 80.0–100.0)
Monocytes Absolute: 0.8 10*3/uL (ref 0.1–1.0)
Monocytes Relative: 7 %
Neutro Abs: 5.9 10*3/uL (ref 1.7–7.7)
Neutrophils Relative %: 55 %
Platelets: 387 10*3/uL (ref 150–400)
RBC: 4.34 MIL/uL (ref 3.87–5.11)
RDW: 14.4 % (ref 11.5–15.5)
WBC: 10.7 10*3/uL — ABNORMAL HIGH (ref 4.0–10.5)
nRBC: 0 % (ref 0.0–0.2)

## 2022-06-16 LAB — SURGICAL PATHOLOGY

## 2022-06-16 MED ORDER — PALONOSETRON HCL INJECTION 0.25 MG/5ML: 0.2500 mg | Freq: Once | INTRAVENOUS | Status: DC

## 2022-06-16 MED ORDER — SODIUM CHLORIDE 0.9% FLUSH
10.0000 mL | INTRAVENOUS | Status: DC | PRN
  Administered 2022-06-16: 10 mL
  Filled 2022-06-16: qty 10

## 2022-06-16 MED ORDER — HEPARIN SOD (PORK) LOCK FLUSH 100 UNIT/ML IV SOLN
500.0000 [IU] | Freq: Once | INTRAVENOUS | Status: AC | PRN
  Administered 2022-06-16: 500 [IU]
  Filled 2022-06-16: qty 5

## 2022-06-16 MED ORDER — SODIUM CHLORIDE 0.9 % IV SOLN
10.0000 mg | Freq: Once | INTRAVENOUS | Status: AC
  Administered 2022-06-16: 10 mg via INTRAVENOUS
  Filled 2022-06-16: qty 10

## 2022-06-16 MED ORDER — SODIUM CHLORIDE 0.9 % IV SOLN
500.0000 mg/m2 | Freq: Once | INTRAVENOUS | Status: AC
  Administered 2022-06-16: 1000 mg via INTRAVENOUS
  Filled 2022-06-16: qty 40

## 2022-06-16 MED ORDER — SODIUM CHLORIDE 0.9 % IV SOLN
Freq: Once | INTRAVENOUS | Status: AC
  Filled 2022-06-16: qty 250

## 2022-06-16 NOTE — Progress Notes (Signed)
Baxter Flattery Rubinas:  I am reviewing this patient's recent colon biopsies and compared to her prior biopsies the severity and extent of chronic active colitis is increased in the current biopsies. It looks like she last received pembrolizumab in April 2023. CMV stains are negative. This is an unusual case and would be happy to discuss prior to signing out.   Per Dr. Janese Banks  she is still having on and off diarrhea and Dr. Marius Ditch is planning to switch her to remicade  Per [name]: She was positive for Yersinia in May and C.diff in Aug. Was she treated for both? Dr Janese Banks: Marvel Plan Reuel Derby:  It might be good to re-test her now  Dr. Marius Ditch: Caryl Pina, please order GI profile PCR   Called patient and patient is coming by today to pick up the stool kit.  Per Dr. Marius Ditch she said to not get her to come and pick up the stool kit till after she talks to her about her symptoms and the path results

## 2022-06-16 NOTE — Patient Instructions (Signed)
[facility] AT [place]  Discharge Instructions: Thank you for choosing [facility] to provide your oncology and hematology care.  If you have a lab appointment with the [facility], please go directly to the [facility] and check in at the registration area.  Wear comfortable clothing and clothing appropriate for easy access to any Portacath or PICC line.   We strive to give you quality time with your provider. You may need to reschedule your appointment if you arrive late (15 or more minutes).  Arriving late affects you and other patients whose appointments are after yours.  Also, if you miss three or more appointments without notifying the office, you may be dismissed from the clinic at the provider's discretion.      For prescription refill requests, have your pharmacy contact our office and allow 72 hours for refills to be completed.    Today you received the following chemotherapy and/or immunotherapy agents: Alimta      To help prevent nausea and vomiting after your treatment, we encourage you to take your nausea medication as directed.  BELOW ARE SYMPTOMS THAT SHOULD BE REPORTED IMMEDIATELY: *FEVER GREATER THAN 100.4 F (38 C) OR HIGHER *CHILLS OR SWEATING *NAUSEA AND VOMITING THAT IS NOT CONTROLLED WITH YOUR NAUSEA MEDICATION *UNUSUAL SHORTNESS OF BREATH *UNUSUAL BRUISING OR BLEEDING *URINARY PROBLEMS (pain or burning when urinating, or frequent urination) *BOWEL PROBLEMS (unusual diarrhea, constipation, pain near the anus) TENDERNESS IN MOUTH AND THROAT WITH OR WITHOUT PRESENCE OF ULCERS (sore throat, sores in mouth, or a toothache) UNUSUAL RASH, SWELLING OR PAIN  UNUSUAL VAGINAL DISCHARGE OR ITCHING   Items with * indicate a potential emergency and should be followed up as soon as possible or go to the Emergency Department if any problems should occur.  Please show the CHEMOTHERAPY ALERT CARD or IMMUNOTHERAPY ALERT CARD at check-in to the  Emergency Department and triage nurse.  Should you have questions after your visit or need to cancel or reschedule your appointment, please contact [facility], [facility], [facility] [place] [facility] [place]  [phone] and follow the prompts.  Office hours are 8:00 a.m. to 4:30 p.m. Monday - Friday. Please note that voicemails left after 4:00 p.m. may not be returned until the following business day.  We are closed weekends and major holidays. You have access to a nurse at all times for urgent questions. Please call the main number to the clinic [phone] and follow the prompts.  For any non-urgent questions, you may also contact your provider using MyChart. We now offer e-Visits for anyone 6 and older to request care online for non-urgent symptoms. For details visit [email or web address].   Also download the MyChart app! Go to the app store, search "MyChart", open the app, select [facility], and log in with your MyChart username and password.  Masks are optional in the cancer centers. If you would like for your care team to wear a mask while they are taking care of you, please let them know. For doctor visits, patients may have with them one support person who is at least 55 years old. At this time, visitors are not allowed in the infusion area.

## 2022-06-17 ENCOUNTER — Encounter: Payer: Self-pay | Admitting: Oncology

## 2022-06-17 NOTE — Telephone Encounter (Signed)
Called [facility] at [phone] ad they said they had not received the authorization by fax they said they had to receive the authorization from insurance company. Emailed Tera [place] and Lattie Haw cowley also to get a update

## 2022-06-17 NOTE — Telephone Encounter (Signed)
Lin Landsman, MD  Shelby Mattocks, CMA We will continue Entyvio and repeat flexible sigmoidoscopy after administration of first dose of Entyvio   RV

## 2022-06-17 NOTE — Telephone Encounter (Signed)
Gina Sanchez has been approved from 06/24/2022 to 06/24/2023.  Did you want me to inform [facility] or are you changing her to another medication

## 2022-06-17 NOTE — Telephone Encounter (Signed)
Called Ms. Allmon to discuss the pathology results and informed her that the pathology features from recent colonoscopy look worse compared to her index colonoscopy.  She was recently treated for C. difficile infection before proceeding with recent colonoscopy.  Patient has been doing very well clinically, reports having 4-6 mushy, nonbloody bowel movements, smaller amounts, sometimes just mucus mixed with yellow stool, predominantly postprandial.  Her appetite is good and she does not have any rectal urgency as well.  Patient feels well overall since treatment of C. difficile.  I think the severity of colitis is due to combination of her underlying immune mediated colitis and recent C. difficile infection.  Her most recent CRP 3 weeks ago was also normal.  This was after the treatment of C. difficile infection.  Since patient continues to feel good clinically, we made a mutual decision to continue Entyvio instead of switching to anti-TNF therapy at this time  I have also discussed with her about FMT given that she had first recurrence of C. difficile.  If she develops another recurrence, will administer FMT and patient is agreeable to it  Cephas Darby, MD [facility], [place]  [address]  [place], [place] [address]  Main: [phone]  Fax: [phone] Pager: [phone]

## 2022-06-18 NOTE — Telephone Encounter (Signed)
Per [employer] This patient is still being worked on, I will be out of the office starting tomorrow and will return on 10/16, I am adding my coworker Danielle to this email in case you need anything.

## 2022-06-19 ENCOUNTER — Other Ambulatory Visit: Payer: Self-pay

## 2022-06-20 ENCOUNTER — Other Ambulatory Visit: Payer: Self-pay

## 2022-06-21 ENCOUNTER — Inpatient Hospital Stay: Payer: 59 | Admitting: Medical Oncology

## 2022-06-21 ENCOUNTER — Inpatient Hospital Stay: Payer: 59

## 2022-06-21 ENCOUNTER — Other Ambulatory Visit: Payer: Self-pay | Admitting: Oncology

## 2022-06-22 ENCOUNTER — Encounter: Payer: Self-pay | Admitting: Oncology

## 2022-06-24 DIAGNOSIS — K523 Indeterminate colitis: Secondary | ICD-10-CM | POA: Diagnosis not present

## 2022-06-24 NOTE — Telephone Encounter (Signed)
Called [facility] to find out the status of the Entyvio. They had to call the major medical team for this patient to find out the status. They state they need the PA approval letter fax to them and once they have this they work on getting the medication shipped to patient and set up the home infusion. Fax to [phone]. They said I could follow up later today by call [phone]

## 2022-06-24 NOTE — Telephone Encounter (Signed)
Called [facility] department and they state it is ready to be shipped to the patient to start the infusion but the patient will need to call [phone] to schedule shipment. Asked if she was set up for the home infusion and she said she would have to transfer me to another department about this information. They state that they have every thing needed to start her infusion. They state the patient needs to call [phone] called patient and patient states someone reach out to her this morning and got a lot of information and emailed her forms. She states they said a nurse would be reaching out to her. Patient states she will definitely call this number

## 2022-06-24 NOTE — Telephone Encounter (Signed)
They also said they needed a PA for approval for the epi pen. Called [facility] and they submitted the PA as urgent. Receive a voicemail from the pharmacist at [facility] the infusion department about  The hydration. Called them back and explain to them that this is the first time doing nursing orders for a patient so I did not know what to put. They said usually they do not have a hydration on the Entyvio. Informed them to take it off then. She states she would do this

## 2022-06-24 NOTE — Telephone Encounter (Signed)
The nursing staff will be reaching out the patient today or tomorrow to set up her infusion and set up delivery of the medication

## 2022-06-28 ENCOUNTER — Other Ambulatory Visit: Payer: Self-pay

## 2022-06-29 ENCOUNTER — Encounter: Payer: Self-pay | Admitting: Oncology

## 2022-06-29 ENCOUNTER — Other Ambulatory Visit: Payer: 59

## 2022-06-29 DIAGNOSIS — K523 Indeterminate colitis: Secondary | ICD-10-CM

## 2022-06-29 NOTE — Telephone Encounter (Signed)
Patient confirmed that the infusion would be 07/12/2022. Schedule the flexsigmoid for 07/28/2022. Went over instructions sent to [employer] and mailed them

## 2022-06-29 NOTE — Telephone Encounter (Signed)
Per patient is picking up medication on 07/02/2022. Emailed [facility] to find out when they patient is going to be schedule for the home infusion.  This is the email I Sent  Patient states she is supposed to pick up the medication for the home infusion on 07/02/2022 but they have not scheduled the home infusion. Can you find out or give me a number I can call to find out when the nurse will come to the patient's house to give the patient the infusion.

## 2022-06-29 NOTE — Telephone Encounter (Signed)
Let's wait for until she receives at least 2 infusions before repeating flexible sigmoidoscopy  RV

## 2022-06-29 NOTE — Telephone Encounter (Signed)
Per Eustace Pen nurse has scheduled this for 10/23 but trying to confirm to see what is the status   Per Eustace Pen I have added the nurse to this email, she spoke with the patient and the patient is asking if ok to move the infusion for 10/30 as the patient will be out of town and the nurse wants to confirm with your office?   Patient will get first home infusion for Entyvio for 07/12/2022. When do you want to set her up for her repeat procedure

## 2022-06-30 ENCOUNTER — Other Ambulatory Visit: Payer: Self-pay | Admitting: Medical Oncology

## 2022-06-30 MED ORDER — ONDANSETRON HCL 4 MG PO TABS: ORAL_TABLET | ORAL | 0 refills | Status: DC

## 2022-07-01 ENCOUNTER — Encounter: Payer: Self-pay | Admitting: Oncology

## 2022-07-02 DIAGNOSIS — K219 Gastro-esophageal reflux disease without esophagitis: Secondary | ICD-10-CM | POA: Diagnosis not present

## 2022-07-02 DIAGNOSIS — H547 Unspecified visual loss: Secondary | ICD-10-CM | POA: Diagnosis not present

## 2022-07-02 DIAGNOSIS — E669 Obesity, unspecified: Secondary | ICD-10-CM | POA: Diagnosis not present

## 2022-07-02 DIAGNOSIS — D84822 Immunodeficiency due to external causes: Secondary | ICD-10-CM | POA: Diagnosis not present

## 2022-07-02 DIAGNOSIS — C779 Secondary and unspecified malignant neoplasm of lymph node, unspecified: Secondary | ICD-10-CM | POA: Diagnosis not present

## 2022-07-02 DIAGNOSIS — I10 Essential (primary) hypertension: Secondary | ICD-10-CM | POA: Diagnosis not present

## 2022-07-02 DIAGNOSIS — I739 Peripheral vascular disease, unspecified: Secondary | ICD-10-CM | POA: Diagnosis not present

## 2022-07-02 DIAGNOSIS — D84821 Immunodeficiency due to drugs: Secondary | ICD-10-CM | POA: Diagnosis not present

## 2022-07-02 DIAGNOSIS — R69 Illness, unspecified: Secondary | ICD-10-CM | POA: Diagnosis not present

## 2022-07-02 DIAGNOSIS — C3490 Malignant neoplasm of unspecified part of unspecified bronchus or lung: Secondary | ICD-10-CM | POA: Diagnosis not present

## 2022-07-02 DIAGNOSIS — G8929 Other chronic pain: Secondary | ICD-10-CM | POA: Diagnosis not present

## 2022-07-06 MED FILL — Dexamethasone Sodium Phosphate Inj 100 MG/10ML: INTRAMUSCULAR | Qty: 1 | Status: AC

## 2022-07-07 ENCOUNTER — Inpatient Hospital Stay: Payer: 59

## 2022-07-07 ENCOUNTER — Other Ambulatory Visit: Payer: Self-pay | Admitting: *Deleted

## 2022-07-07 ENCOUNTER — Encounter: Payer: Self-pay | Admitting: Internal Medicine

## 2022-07-07 ENCOUNTER — Inpatient Hospital Stay (HOSPITAL_BASED_OUTPATIENT_CLINIC_OR_DEPARTMENT_OTHER): Payer: 59 | Admitting: Internal Medicine

## 2022-07-07 ENCOUNTER — Telehealth: Payer: Self-pay | Admitting: Gastroenterology

## 2022-07-07 ENCOUNTER — Ambulatory Visit: Payer: 59

## 2022-07-07 DIAGNOSIS — Z95828 Presence of other vascular implants and grafts: Secondary | ICD-10-CM

## 2022-07-07 DIAGNOSIS — C3491 Malignant neoplasm of unspecified part of right bronchus or lung: Secondary | ICD-10-CM | POA: Diagnosis not present

## 2022-07-07 DIAGNOSIS — Z5111 Encounter for antineoplastic chemotherapy: Secondary | ICD-10-CM | POA: Diagnosis not present

## 2022-07-07 DIAGNOSIS — Z79899 Other long term (current) drug therapy: Secondary | ICD-10-CM | POA: Diagnosis not present

## 2022-07-07 LAB — CBC WITH DIFFERENTIAL/PLATELET
Abs Immature Granulocytes: 0.18 10*3/uL — ABNORMAL HIGH (ref 0.00–0.07)
Basophils Absolute: 0.1 10*3/uL (ref 0.0–0.1)
Basophils Relative: 1 %
Eosinophils Absolute: 0.3 10*3/uL (ref 0.0–0.5)
Eosinophils Relative: 3 %
HCT: 36.3 % (ref 36.0–46.0)
Hemoglobin: 11.5 g/dL — ABNORMAL LOW (ref 12.0–15.0)
Immature Granulocytes: 2 %
Lymphocytes Relative: 37 %
Lymphs Abs: 4.3 10*3/uL — ABNORMAL HIGH (ref 0.7–4.0)
MCH: 27.5 pg (ref 26.0–34.0)
MCHC: 31.7 g/dL (ref 30.0–36.0)
MCV: 86.8 fL (ref 80.0–100.0)
Monocytes Absolute: 0.9 10*3/uL (ref 0.1–1.0)
Monocytes Relative: 8 %
Neutro Abs: 5.9 10*3/uL (ref 1.7–7.7)
Neutrophils Relative %: 49 %
Platelets: 438 10*3/uL — ABNORMAL HIGH (ref 150–400)
RBC: 4.18 MIL/uL (ref 3.87–5.11)
RDW: 14.8 % (ref 11.5–15.5)
WBC: 11.7 10*3/uL — ABNORMAL HIGH (ref 4.0–10.5)
nRBC: 0 % (ref 0.0–0.2)

## 2022-07-07 LAB — COMPREHENSIVE METABOLIC PANEL
ALT: 15 U/L (ref 0–44)
AST: 17 U/L (ref 15–41)
Albumin: 3 g/dL — ABNORMAL LOW (ref 3.5–5.0)
Alkaline Phosphatase: 75 U/L (ref 38–126)
Anion gap: 6 (ref 5–15)
BUN: 12 mg/dL (ref 6–20)
CO2: 23 mmol/L (ref 22–32)
Calcium: 8.3 mg/dL — ABNORMAL LOW (ref 8.9–10.3)
Chloride: 109 mmol/L (ref 98–111)
Creatinine, Ser: 0.75 mg/dL (ref 0.44–1.00)
GFR, Estimated: >60 mL/min (ref >60)
Glucose, Bld: 124 mg/dL — ABNORMAL HIGH (ref 70–99)
Potassium: 3.7 mmol/L (ref 3.5–5.1)
Sodium: 138 mmol/L (ref 135–145)
Total Bilirubin: 0.3 mg/dL (ref 0.3–1.2)
Total Protein: 6.3 g/dL — ABNORMAL LOW (ref 6.5–8.1)

## 2022-07-07 MED ORDER — CYANOCOBALAMIN 1000 MCG/ML IJ SOLN
1000.0000 ug | INTRAMUSCULAR | Status: DC
  Administered 2022-07-07: 1000 ug via INTRAMUSCULAR
  Filled 2022-07-07: qty 1

## 2022-07-07 MED ORDER — SODIUM CHLORIDE 0.9 % IV SOLN
10.0000 mg | Freq: Once | INTRAVENOUS | Status: AC
  Administered 2022-07-07: 10 mg via INTRAVENOUS
  Filled 2022-07-07: qty 10

## 2022-07-07 MED ORDER — SODIUM CHLORIDE 0.9% FLUSH
10.0000 mL | Freq: Once | INTRAVENOUS | Status: AC | PRN
  Administered 2022-07-07: 10 mL
  Filled 2022-07-07: qty 10

## 2022-07-07 MED ORDER — SODIUM CHLORIDE 0.9 % IV SOLN
500.0000 mg/m2 | Freq: Once | INTRAVENOUS | Status: AC
  Administered 2022-07-07: 1000 mg via INTRAVENOUS
  Filled 2022-07-07: qty 40

## 2022-07-07 MED ORDER — SODIUM CHLORIDE 0.9% FLUSH
10.0000 mL | INTRAVENOUS | Status: DC | PRN
  Administered 2022-07-07: 10 mL via INTRAVENOUS
  Filled 2022-07-07: qty 10

## 2022-07-07 MED ORDER — HEPARIN SOD (PORK) LOCK FLUSH 100 UNIT/ML IV SOLN
500.0000 [IU] | Freq: Once | INTRAVENOUS | Status: AC | PRN
  Administered 2022-07-07: 500 [IU]
  Filled 2022-07-07: qty 5

## 2022-07-07 MED ORDER — SODIUM CHLORIDE 0.9 % IV SOLN
Freq: Once | INTRAVENOUS | Status: AC
  Filled 2022-07-07: qty 250

## 2022-07-07 MED ORDER — HEPARIN SOD (PORK) LOCK FLUSH 100 UNIT/ML IV SOLN
250.0000 [IU] | Freq: Once | INTRAVENOUS | Status: DC | PRN
  Filled 2022-07-07: qty 5

## 2022-07-07 MED ORDER — ALTEPLASE 2 MG IJ SOLR
2.0000 mg | Freq: Once | INTRAMUSCULAR | Status: AC | PRN
  Administered 2022-07-07: 2 mg
  Filled 2022-07-07: qty 2

## 2022-07-07 NOTE — Telephone Encounter (Signed)
Pt called to cancel her procedure on 07/28/2022 for Flexible Sigmoid  and would like to resched

## 2022-07-07 NOTE — Progress Notes (Signed)
Patient has history of right broken femur with new pain.  Reports a "funny feeling" in left femur for the past 2 weeks.

## 2022-07-07 NOTE — Progress Notes (Signed)
No blood return from port prior to treatment despite multiple flushes. Instilled Cathoflow and allowed to dwell per policy. No blood return noted at 30 minutes; allowed to dwell another 90 minutes, still no blood return. Port flushed and deaccessed. MD notified. Dye study to be ordered.

## 2022-07-07 NOTE — Patient Instructions (Signed)
[facility] CANCER CTR AT [place]  Discharge Instructions: Thank you for choosing [facility] to provide your oncology and hematology care.  If you have a lab appointment with the [facility], please go directly to the [facility] and check in at the registration area.  Wear comfortable clothing and clothing appropriate for easy access to any Portacath or PICC line.   We strive to give you quality time with your provider. You may need to reschedule your appointment if you arrive late (15 or more minutes).  Arriving late affects you and other patients whose appointments are after yours.  Also, if you miss three or more appointments without notifying the office, you may be dismissed from the clinic at the provider's discretion.      For prescription refill requests, have your pharmacy contact our office and allow 72 hours for refills to be completed.    Today you received the following chemotherapy and/or immunotherapy agents- Alimta      To help prevent nausea and vomiting after your treatment, we encourage you to take your nausea medication as directed.  BELOW ARE SYMPTOMS THAT SHOULD BE REPORTED IMMEDIATELY: *FEVER GREATER THAN 100.4 F (38 C) OR HIGHER *CHILLS OR SWEATING *NAUSEA AND VOMITING THAT IS NOT CONTROLLED WITH YOUR NAUSEA MEDICATION *UNUSUAL SHORTNESS OF BREATH *UNUSUAL BRUISING OR BLEEDING *URINARY PROBLEMS (pain or burning when urinating, or frequent urination) *BOWEL PROBLEMS (unusual diarrhea, constipation, pain near the anus) TENDERNESS IN MOUTH AND THROAT WITH OR WITHOUT PRESENCE OF ULCERS (sore throat, sores in mouth, or a toothache) UNUSUAL RASH, SWELLING OR PAIN  UNUSUAL VAGINAL DISCHARGE OR ITCHING   Items with * indicate a potential emergency and should be followed up as soon as possible or go to the Emergency Department if any problems should occur.  Please show the CHEMOTHERAPY ALERT CARD or IMMUNOTHERAPY ALERT CARD at check-in to the  Emergency Department and triage nurse.  Should you have questions after your visit or need to cancel or reschedule your appointment, please contact [facility] CANCER [place] AT [place]  [phone] and follow the prompts.  Office hours are 8:00 a.m. to 4:30 p.m. Monday - Friday. Please note that voicemails left after 4:00 p.m. may not be returned until the following business day.  We are closed weekends and major holidays. You have access to a nurse at all times for urgent questions. Please call the main number to the clinic [phone] and follow the prompts.  For any non-urgent questions, you may also contact your provider using MyChart. We now offer e-Visits for anyone 64 and older to request care online for non-urgent symptoms. For details visit [email or web address].   Also download the MyChart app! Go to the app store, search "MyChart", open the app, select [facility], and log in with your MyChart username and password.  Masks are optional in the cancer centers. If you would like for your care team to wear a mask while they are taking care of you, please let them know. For doctor visits, patients may have with them one support person who is at least 55 years old. At this time, visitors are not allowed in the infusion area.

## 2022-07-07 NOTE — Telephone Encounter (Signed)
Patient states she just started chemo and does not want to miss her treatment. She is going to reschedule Flexsigmoid to 08/03/2022. Tried to call endo but no one answered. Will try again at another time. Patient states she does not need new instructions with new date on it

## 2022-07-07 NOTE — Progress Notes (Signed)
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  She underwent operative fixation on 10/12/2020.  MRI of femur showed heterogeneously enhancing osseous lesion within the area which would be nonspecific versus office neoplasm or metastatic lesion.  CT chest abdomen and pelvis with contrast showed an enlarged pretracheal lymph node 2.2 x 1.5 cm and a right paratracheal lymph node measuring 2.7 x 1.3 cm.  Prevascular node measuring 0.3 x 0.9 cm.  5 x 4 mm right middle lobe nodule.  2.8 x 1.9 cm left adrenal lesion.    Reamings from the right femur showed metastatic poorly differentiated carcinoma.  Immunohistochemistry showed was positive for pancytokeratin, CK7 and patchy CK20 with patchy dim expression of TTF-1.  Cells negative for Melan-A, CDX2, PAX8, Napsin A, GATA3, p40, CD56, p16 and thyroglobulin.  Findings compatible with metastatic carcinoma but because of decalcification immunohistochemical staining is unreliable.  Patchy dim staining with TTF-1 suspicious for lung primary but not a definitive diagnosis.   Repeat supraclavicular lymph node biopsy showed metastatic adenocarcinoma.  Tumor cells positive for CK7 with  focal weak staining for TTF-1.  Suggestive of lung origin in the proper clinical context.  Cells were negative for GATA3 PAX8 CDX2 and CK20 and Napsin A.  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  She is currently on hydrocortisone twice daily.  Thyroid functions presently are normal    Interval history-patient reports that her diarrhea is better after treating her C. difficile but has not resolved completely.  It can still come out of nowhere and she needs to plan to have a restroom close by.  56 year old female patient with a history of non-small cell cancer/adenocarcinoma of the lung is here for follow-up.  Patient follows up with Dr. Janese Banks; and I am evaluating the patient in Dr. Elroy Channel absence.  Patient is currently on Alimta single agent.  Patient is not on Keytruda because of colitis.  She is awaiting a flex sig on November 15.  Currently denies any diarrhea. Patient complains of intermittent right thigh pain.  Patient has a prior history of fracture.  She is not on any pain medication.  No nausea no vomiting.  No fever no chills.    Review of systems- Review of Systems  Constitutional:  Positive for malaise/fatigue. Negative for chills, fever and weight loss.  HENT:  Negative for congestion, ear discharge and nosebleeds.   Eyes:  Negative for blurred vision.  Respiratory:  Negative for cough, hemoptysis, sputum production, shortness of breath and wheezing.   Cardiovascular:  Negative for chest pain, palpitations, orthopnea and claudication.  Gastrointestinal:  Positive for diarrhea. Negative for abdominal pain, blood in stool, constipation, heartburn, melena, nausea and  vomiting.  Genitourinary:  Negative for dysuria, flank pain, frequency, hematuria and urgency.  Musculoskeletal:  Negative for back pain, joint pain and myalgias.  Skin:  Negative for  rash.  Neurological:  Negative for dizziness, tingling, focal weakness, seizures, weakness and headaches.  Endo/Heme/Allergies:  Does not bruise/bleed easily.  Psychiatric/Behavioral:  Negative for depression and suicidal ideas. The patient does not have insomnia.       Allergies  Allergen Reactions   Factive [Gemifloxacin] Rash     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   Palpitations    Precordial chest pain    Wrist fracture 03/2021   left     Past Surgical History:  Procedure Laterality Date   BREAST BIOPSY Right 2017   benign   CERVICAL BIOPSY  W/ LOOP ELECTRODE EXCISION     COLONOSCOPY  06/11/2022   Procedure: COLONOSCOPY;  Surgeon: Lin Landsman, MD;  Location: [facility] ENDOSCOPY;  Service: Gastroenterology;;   COLONOSCOPY WITH PROPOFOL N/A 07/03/2015   Procedure: COLONOSCOPY WITH PROPOFOL;  Surgeon: Hulen Luster, MD;  Location[facility] ENDOSCOPY;  Service: Gastroenterology;  Laterality: N/A;   COLONOSCOPY WITH PROPOFOL N/A 01/06/2022   Procedure: COLONOSCOPY WITH PROPOFOL;  Surgeon: Lin Landsman, MD;  Location[facility] ENDOSCOPY;  Service: Gastroenterology;  Laterality: N/A;   ESOPHAGOGASTRODUODENOSCOPY (EGD) WITH PROPOFOL N/A 07/03/2015   Procedure: ESOPHAGOGASTRODUODENOSCOPY (EGD) WITH PROPOFOL;  Surgeon: Hulen Luster, MD;  Location: [facility] ENDOSCOPY;  Service: Gastroenterology;  Laterality: N/A;   FLEXIBLE SIGMOIDOSCOPY N/A 03/12/2022   Procedure: FLEXIBLE SIGMOIDOSCOPY;  Surgeon: Lin Landsman, MD;  Location: [facility] ENDOSCOPY;  Service: Gastroenterology;  Laterality: N/A;   FRACTURE SURGERY     INTRAMEDULLARY (IM) NAIL INTERTROCHANTERIC Right 09/15/2020   Procedure: INTRAMEDULLARY (IM) NAIL INTERTROCHANTRIC;  Surgeon: Corky Mull,  MD;  Location: [facility] ORS;  Service: Orthopedics;  Laterality: Right;   IR IMAGING GUIDED PORT INSERTION  11/28/2020   LEFT HEART CATH AND CORONARY ANGIOGRAPHY N/A 03/28/2017   Procedure: Left Heart Cath and Coronary Angiography;  Surgeon: Lorretta Harp, MD;  Location: [place] CV LAB;  Service: Cardiovascular;  Laterality: N/A;   ORIF WRIST FRACTURE Left 03/31/2021   Procedure: OPEN REDUCTION INTERNAL FIXATION (ORIF) LEFT DISTAL RADIUS FRACTURE.;  Surgeon: Corky Mull, MD;  Location: [facility] ORS;  Service: Orthopedics;  Laterality: Left;    Social History   Socioeconomic History   Marital status: Married    Spouse name: Gene   Number of children: 2   Years of education: Not on file   Highest education level: Not on file  Occupational History   Not on file  Tobacco Use   Smoking status: Former    Packs/day: 1.00    Years: 30.00    Total pack years: 30.00    Types: Cigarettes    Quit date: 09/17/2020    Years since quitting: 1.8   Smokeless tobacco: Never  Vaping Use   Vaping Use: Never used  Substance and Sexual Activity   Alcohol use: Not Currently    Alcohol/week: 1.0 standard drink of alcohol    Types: 1 Standard drinks or equivalent per week    Comment: beer occ.   Drug use: No  Sexual activity: Yes    Birth control/protection: Post-menopausal  Other Topics Concern   Not on file  Social History Narrative   Lives in [place] with her husband.  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   Social Determinants of Health   Financial Resource Strain: Not on file  Food Insecurity: Not on file  Transportation Needs: Not on file  Physical Activity: Not on file  Stress: Not on file  Social Connections: Not on file  Intimate Partner Violence: Not on file    Family History  Problem Relation Age of Onset   Diabetes Mother        alive @ 73   Goiter Mother    Heart disease Father        died of MI @ 2   Osteoporosis  Maternal Grandmother    Colon cancer Maternal Grandfather    Breast cancer Neg Hx    Ovarian cancer Neg Hx      Current Outpatient Medications:    ALPRAZolam (XANAX) 0.5 MG tablet, TAKE ONE TABLET DAILY AS NEEDED, Disp: 30 tablet, Rfl: 0   famotidine (PEPCID) 20 MG tablet, Take 20 mg by mouth in the morning., Disp: , Rfl:    folic acid (FOLVITE) 1 MG tablet, Take 1 tablet (1 mg total) by mouth daily. Start 7 days before pemetrexed chemotherapy. Continue until 21 days after pemetrexed completed., Disp: 100 tablet, Rfl: 3   hydrocortisone (CORTEF) 10 MG tablet, TAKE 1 TABLET BY MOUTH NIGHTLY, Disp: 30 tablet, Rfl: 3   hydrocortisone (CORTEF) 20 MG tablet, TAKE 1 TABLET BY MOUTH EVERY MORNING, Disp: 30 tablet, Rfl: 3   lidocaine-prilocaine (EMLA) cream, Apply to affected area once, Disp: 30 g, Rfl: 3   metoprolol tartrate (LOPRESSOR) 25 MG tablet, Take 25 mg by mouth in the morning., Disp: , Rfl:    ondansetron (ZOFRAN) 4 MG tablet, TAKE 1 TABLET BY MOUTH EVERY 8 HOURS AS NEEDED FOR NAUSEA AND VOMITING, Disp: 20 tablet, Rfl: 0   oxyCODONE (OXY IR/ROXICODONE) 5 MG immediate release tablet, TAKE 1-2 TABLETS BY MOUTH EVERY 4 HOURS AS NEEDED FOR MODERATE PAIN, Disp: 60 tablet, Rfl: 0   prochlorperazine (COMPAZINE) 10 MG tablet, Take 1 tablet (10 mg total) by mouth every 6 (six) hours as needed for nausea or vomiting., Disp: 30 tablet, Rfl: 1 No current facility-administered medications for this visit.  Facility-Administered Medications Ordered in Other Visits:    sodium chloride flush (NS) 0.9 % injection 10 mL, 10 mL, Intravenous, PRN, Sindy Guadeloupe, MD, 10 mL at 03/09/21 0906  Physical exam:  Vitals:   07/07/22 0800  BP: 112/78  Pulse: 77  Resp: 18  Temp: (!) 97.2 F (36.2 C)  TempSrc: Tympanic  SpO2: 98%  Weight: 202 lb 11.2 oz (91.9 kg)  Height: 5' 6"  (1.676 m)   Physical Exam Constitutional:      General: She is not in acute distress. Cardiovascular:     Rate and Rhythm: Normal  rate and regular rhythm.     Heart sounds: Normal heart sounds.  Pulmonary:     Effort: Pulmonary effort is normal.     Breath sounds: Normal breath sounds.  Skin:    General: Skin is warm and dry.  Neurological:     Mental Status: She is alert and oriented to person, place, and time.         Latest Ref Rng & Units 07/07/2022    8:02 AM  CMP  Glucose 70 - 99 mg/dL 124   BUN 6 - 20 mg/dL 12   Creatinine 0.44 - 1.00 mg/dL 0.75   Sodium 135 - 145 mmol/L 138   Potassium 3.5 - 5.1 mmol/L 3.7   Chloride 98 - 111 mmol/L 109   CO2 22 - 32 mmol/L 23   Calcium 8.9 - 10.3 mg/dL 8.3   Total Protein 6.5 - 8.1 g/dL 6.3   Total Bilirubin 0.3 - 1.2 mg/dL 0.3   Alkaline Phos 38 - 126 U/L 75   AST 15 - 41 U/L 17   ALT 0 - 44 U/L 15       Latest Ref Rng & Units 07/07/2022    8:02 AM  CBC  WBC 4.0 - 10.5 K/uL 11.7   Hemoglobin 12.0 - 15.0 g/dL 11.5   Hematocrit 36.0 - 46.0 % 36.3   Platelets 150 - 400 K/uL 438     No images are attached to the encounter.  No results found.   Assessment and plan- Patient is a 56 y.o. female with metastatic adenocarcinoma of the lung currently on single agent Alimta.  #Proceed with Alimta today. Labs today reviewed;  acceptable for treatment today.   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 Marius Ditch.  She is going to be getting a repeat flexible sigmoidoscopy to decide about change of treatment from Entyvio to Remicade.  #Bone metastases- on Zometa; HOLD Zometa today.recommend vit D- 5000 units/da; continue ca 1200 mg/day. ? Pain in Right thigh- order bone scan ASAP.   # Malnutrition-  discussed re: increased protein intake  # DISPOSITION: # Bone scan ASAP # proceed wit Alimat toyda; B12 injection today # Follow up with Dr.Rao/infusion/labs as planned on 11/15.   Visit Diagnosis 1. Primary malignant neoplasm of right lung metastatic to other site [facility])      Dr. Randa Evens, MD, MPH [facility] [phone] 07/07/2022 9:43 AM

## 2022-07-08 ENCOUNTER — Other Ambulatory Visit: Payer: Self-pay | Admitting: Oncology

## 2022-07-08 DIAGNOSIS — C3491 Malignant neoplasm of unspecified part of right bronchus or lung: Secondary | ICD-10-CM

## 2022-07-08 NOTE — Telephone Encounter (Signed)
Called Endo and talk to [facility] to reschedule the procedure

## 2022-07-10 ENCOUNTER — Other Ambulatory Visit: Payer: Self-pay

## 2022-07-12 ENCOUNTER — Ambulatory Visit: Payer: 59 | Admitting: Radiology

## 2022-07-12 ENCOUNTER — Telehealth: Payer: Self-pay | Admitting: *Deleted

## 2022-07-12 DIAGNOSIS — K523 Indeterminate colitis: Secondary | ICD-10-CM | POA: Diagnosis not present

## 2022-07-12 NOTE — Telephone Encounter (Signed)
The pt did not look at her appts on my chart so I called to tell her that it got changed to 11/17 9 am and she asked if she will see dr Janese Banks and I told her yes. She is agreeable for this

## 2022-07-13 ENCOUNTER — Encounter
Admission: RE | Admit: 2022-07-13 | Discharge: 2022-07-13 | Disposition: A | Discharge status: Home / Self Care | Payer: 59 | Source: Ambulatory Visit | Attending: Internal Medicine | Admitting: Internal Medicine

## 2022-07-13 DIAGNOSIS — C3490 Malignant neoplasm of unspecified part of unspecified bronchus or lung: Secondary | ICD-10-CM | POA: Diagnosis not present

## 2022-07-13 DIAGNOSIS — C3491 Malignant neoplasm of unspecified part of right bronchus or lung: Secondary | ICD-10-CM | POA: Diagnosis not present

## 2022-07-13 DIAGNOSIS — C7951 Secondary malignant neoplasm of bone: Secondary | ICD-10-CM | POA: Diagnosis not present

## 2022-07-13 MED ORDER — TECHNETIUM TC 99M MEDRONATE IV KIT
20.0000 | PACK | Freq: Once | INTRAVENOUS | Status: AC | PRN
  Administered 2022-07-13: 21.91 via INTRAVENOUS

## 2022-07-13 NOTE — Progress Notes (Signed)
Pt is coming in for Port Check at 11:15a at the new entrance.  I called to make sure that she knew where to come in.  Called 07/13/22

## 2022-07-14 ENCOUNTER — Encounter: Payer: Self-pay | Admitting: Gastroenterology

## 2022-07-14 ENCOUNTER — Ambulatory Visit
Admission: RE | Admit: 2022-07-14 | Discharge: 2022-07-14 | Disposition: A | Discharge status: Home / Self Care | Payer: 59 | Source: Ambulatory Visit | Attending: Internal Medicine | Admitting: Internal Medicine

## 2022-07-14 DIAGNOSIS — Z452 Encounter for adjustment and management of vascular access device: Secondary | ICD-10-CM | POA: Insufficient documentation

## 2022-07-14 DIAGNOSIS — Z95828 Presence of other vascular implants and grafts: Secondary | ICD-10-CM

## 2022-07-14 HISTORY — PX: IR CV LINE INJECTION: IMG2294

## 2022-07-14 MED ORDER — HEPARIN SOD (PORK) LOCK FLUSH 100 UNIT/ML IV SOLN: 500.0000 [IU] | Freq: Once | INTRAVENOUS | Status: DC

## 2022-07-14 MED ORDER — HEPARIN SOD (PORK) LOCK FLUSH 100 UNIT/ML IV SOLN
INTRAVENOUS | Status: AC
  Administered 2022-07-14: 500 [IU]
  Filled 2022-07-14: qty 5

## 2022-07-14 MED ORDER — IOHEXOL 300 MG/ML  SOLN
4.0000 mL | Freq: Once | INTRAMUSCULAR | Status: AC | PRN
  Administered 2022-07-14: 4 mL via INTRAVENOUS

## 2022-07-20 ENCOUNTER — Telehealth: Payer: Self-pay | Admitting: *Deleted

## 2022-07-20 ENCOUNTER — Other Ambulatory Visit: Payer: Self-pay | Admitting: *Deleted

## 2022-07-20 NOTE — Telephone Encounter (Signed)
Patient called asking about her BMD results  Narrative & Impression  CLINICAL DATA:  Non-small cell lung cancer, osseous metastatic disease. Follow-up examination.,   EXAM: NUCLEAR MEDICINE WHOLE BODY BONE SCAN   TECHNIQUE: Whole body anterior and posterior images were obtained approximately 3 hours after intravenous injection of radiopharmaceutical.   RADIOPHARMACEUTICALS:  23.44 mCi Technetium-34mMDP IV   COMPARISON:  02/22/2022, 11/11/2021   FINDINGS: Uptake within the left anterior iliac crest and proximal right femur is again identified in keeping with known osseous metastases and appears stable since prior examination. There has, however, developed new focal uptake within the right fifth and 6 ribs later anterolaterally, left seventh rib posterolaterally, right superior pubic ramus, and left superior iliac crest which are suspicious for new osseous metastatic disease. There is, additionally, increasing asymmetric focal uptake of radiotracer within the distal left clavicle. These do not demonstrate a correlate finding on CT examination of 06/03/2022.   Not included on prior CT examination is a focal lesion within the distal right femur which demonstrates increased uptake of radiotracer since remote prior examination of 11/11/2021 which may represent progressive osseous metastatic disease or postsurgical change given right femoral ORIF.   Uptake within the shoulders, knees, and feet bilaterally are again identified, likely degenerative in nature.q   IMPRESSION: Interval development of multiple foci of radiotracer uptake in keeping with progressive osseous metastatic disease as outlined above.     Electronically Signed   By: AFidela SalisburyM.D.   On: 07/15/2022 20:53

## 2022-07-20 NOTE — Telephone Encounter (Signed)
Will discuss in person on 11/17

## 2022-07-20 NOTE — Telephone Encounter (Signed)
I called the patient because she wanted to see what kind of issues she has from the scan. She was worried because that she has been having the pains in both legs and she was worried that she might have another fracture. I told her that it did light up in right 5 and 6 ribs, some on left iliac crest is suspicious. I spoke to Dr. Janese Banks and asked what to do and she says she will see her 11/17 and she does not feel she would have fracture with these results. She feels like she has bone mets probably because of the several months could not take chemo because of the  cdiff she had. Pt feels better after getting input from [employer]

## 2022-07-28 ENCOUNTER — Other Ambulatory Visit: Payer: 59

## 2022-07-28 ENCOUNTER — Ambulatory Visit: Payer: 59

## 2022-07-28 ENCOUNTER — Ambulatory Visit: Payer: 59 | Admitting: Oncology

## 2022-07-29 MED FILL — Dexamethasone Sodium Phosphate Inj 100 MG/10ML: INTRAMUSCULAR | Qty: 1 | Status: AC

## 2022-07-30 ENCOUNTER — Inpatient Hospital Stay: Payer: 59 | Attending: Oncology

## 2022-07-30 ENCOUNTER — Inpatient Hospital Stay (HOSPITAL_BASED_OUTPATIENT_CLINIC_OR_DEPARTMENT_OTHER): Payer: 59 | Admitting: Oncology

## 2022-07-30 ENCOUNTER — Inpatient Hospital Stay: Payer: 59

## 2022-07-30 ENCOUNTER — Encounter: Payer: Self-pay | Admitting: Oncology

## 2022-07-30 VITALS — BP 131/86 | HR 90 | Temp 98.0°F | Resp 18 | Wt 200.9 lb

## 2022-07-30 DIAGNOSIS — K219 Gastro-esophageal reflux disease without esophagitis: Secondary | ICD-10-CM | POA: Diagnosis not present

## 2022-07-30 DIAGNOSIS — C3490 Malignant neoplasm of unspecified part of unspecified bronchus or lung: Secondary | ICD-10-CM | POA: Diagnosis not present

## 2022-07-30 DIAGNOSIS — C7951 Secondary malignant neoplasm of bone: Secondary | ICD-10-CM | POA: Diagnosis not present

## 2022-07-30 DIAGNOSIS — I10 Essential (primary) hypertension: Secondary | ICD-10-CM | POA: Diagnosis not present

## 2022-07-30 DIAGNOSIS — C778 Secondary and unspecified malignant neoplasm of lymph nodes of multiple regions: Secondary | ICD-10-CM | POA: Insufficient documentation

## 2022-07-30 DIAGNOSIS — Z800 Family history of malignant neoplasm of digestive organs: Secondary | ICD-10-CM | POA: Insufficient documentation

## 2022-07-30 DIAGNOSIS — Z7189 Other specified counseling: Secondary | ICD-10-CM | POA: Diagnosis not present

## 2022-07-30 DIAGNOSIS — Z7983 Long term (current) use of bisphosphonates: Secondary | ICD-10-CM

## 2022-07-30 DIAGNOSIS — E2740 Unspecified adrenocortical insufficiency: Secondary | ICD-10-CM | POA: Insufficient documentation

## 2022-07-30 DIAGNOSIS — C7971 Secondary malignant neoplasm of right adrenal gland: Secondary | ICD-10-CM | POA: Diagnosis not present

## 2022-07-30 DIAGNOSIS — C3491 Malignant neoplasm of unspecified part of right bronchus or lung: Secondary | ICD-10-CM

## 2022-07-30 DIAGNOSIS — G893 Neoplasm related pain (acute) (chronic): Secondary | ICD-10-CM | POA: Diagnosis not present

## 2022-07-30 DIAGNOSIS — Z5111 Encounter for antineoplastic chemotherapy: Secondary | ICD-10-CM

## 2022-07-30 DIAGNOSIS — Z79899 Other long term (current) drug therapy: Secondary | ICD-10-CM | POA: Diagnosis not present

## 2022-07-30 DIAGNOSIS — Z5181 Encounter for therapeutic drug level monitoring: Secondary | ICD-10-CM | POA: Diagnosis not present

## 2022-07-30 DIAGNOSIS — Z87891 Personal history of nicotine dependence: Secondary | ICD-10-CM | POA: Diagnosis not present

## 2022-07-30 LAB — COMPREHENSIVE METABOLIC PANEL
ALT: 13 U/L (ref 0–44)
AST: 21 U/L (ref 15–41)
Albumin: 3.2 g/dL — ABNORMAL LOW (ref 3.5–5.0)
Alkaline Phosphatase: 85 U/L (ref 38–126)
Anion gap: 8 (ref 5–15)
BUN: 10 mg/dL (ref 6–20)
CO2: 23 mmol/L (ref 22–32)
Calcium: 8.6 mg/dL — ABNORMAL LOW (ref 8.9–10.3)
Chloride: 105 mmol/L (ref 98–111)
Creatinine, Ser: 0.74 mg/dL (ref 0.44–1.00)
GFR, Estimated: >60 mL/min (ref >60)
Glucose, Bld: 130 mg/dL — ABNORMAL HIGH (ref 70–99)
Potassium: 3.5 mmol/L (ref 3.5–5.1)
Sodium: 136 mmol/L (ref 135–145)
Total Bilirubin: 0.5 mg/dL (ref 0.3–1.2)
Total Protein: 6.5 g/dL (ref 6.5–8.1)

## 2022-07-30 LAB — CBC WITH DIFFERENTIAL/PLATELET
Abs Immature Granulocytes: 0.1 10*3/uL — ABNORMAL HIGH (ref 0.00–0.07)
Basophils Absolute: 0.1 10*3/uL (ref 0.0–0.1)
Basophils Relative: 1 %
Eosinophils Absolute: 0.2 10*3/uL (ref 0.0–0.5)
Eosinophils Relative: 2 %
HCT: 36.8 % (ref 36.0–46.0)
Hemoglobin: 11.7 g/dL — ABNORMAL LOW (ref 12.0–15.0)
Immature Granulocytes: 1 %
Lymphocytes Relative: 26 %
Lymphs Abs: 2.8 10*3/uL (ref 0.7–4.0)
MCH: 26.9 pg (ref 26.0–34.0)
MCHC: 31.8 g/dL (ref 30.0–36.0)
MCV: 84.6 fL (ref 80.0–100.0)
Monocytes Absolute: 0.9 10*3/uL (ref 0.1–1.0)
Monocytes Relative: 8 %
Neutro Abs: 7 10*3/uL (ref 1.7–7.7)
Neutrophils Relative %: 62 %
Platelets: 415 10*3/uL — ABNORMAL HIGH (ref 150–400)
RBC: 4.35 MIL/uL (ref 3.87–5.11)
RDW: 15.4 % (ref 11.5–15.5)
WBC: 11 10*3/uL — ABNORMAL HIGH (ref 4.0–10.5)
nRBC: 0 % (ref 0.0–0.2)

## 2022-07-30 MED ORDER — SODIUM CHLORIDE 0.9 % IV SOLN
10.0000 mg | Freq: Once | INTRAVENOUS | Status: AC
  Administered 2022-07-30: 10 mg via INTRAVENOUS
  Filled 2022-07-30: qty 10

## 2022-07-30 MED ORDER — ZOLEDRONIC ACID 4 MG/5ML IV CONC
4.0000 mg | Freq: Once | INTRAVENOUS | Status: AC
  Administered 2022-07-30: 4 mg via INTRAVENOUS
  Filled 2022-07-30: qty 5

## 2022-07-30 MED ORDER — ZOLEDRONIC ACID 4 MG/100ML IV SOLN: 4.0000 mg | Freq: Once | INTRAVENOUS | Status: DC

## 2022-07-30 MED ORDER — SODIUM CHLORIDE 0.9 % IV SOLN
Freq: Once | INTRAVENOUS | Status: AC
  Filled 2022-07-30: qty 250

## 2022-07-30 MED ORDER — SODIUM CHLORIDE 0.9% FLUSH
10.0000 mL | Freq: Once | INTRAVENOUS | Status: AC
  Administered 2022-07-30: 10 mL via INTRAVENOUS
  Filled 2022-07-30: qty 10

## 2022-07-30 MED ORDER — HEPARIN SOD (PORK) LOCK FLUSH 100 UNIT/ML IV SOLN
500.0000 [IU] | Freq: Once | INTRAVENOUS | Status: AC | PRN
  Administered 2022-07-30: 500 [IU]
  Filled 2022-07-30: qty 5

## 2022-07-30 MED ORDER — HEPARIN SOD (PORK) LOCK FLUSH 100 UNIT/ML IV SOLN
500.0000 [IU] | Freq: Once | INTRAVENOUS | Status: DC
  Filled 2022-07-30: qty 5

## 2022-07-30 MED ORDER — SODIUM CHLORIDE 0.9 % IV SOLN
500.0000 mg/m2 | Freq: Once | INTRAVENOUS | Status: AC
  Administered 2022-07-30: 1000 mg via INTRAVENOUS
  Filled 2022-07-30: qty 40

## 2022-07-30 NOTE — Progress Notes (Signed)
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  She underwent operative fixation on 10/12/2020.  MRI of femur showed heterogeneously enhancing osseous lesion within the area which would be nonspecific versus office neoplasm or metastatic lesion.  CT chest abdomen and pelvis with contrast showed an enlarged pretracheal lymph node 2.2 x 1.5 cm and a right paratracheal lymph node measuring 2.7 x 1.3 cm.  Prevascular node measuring 0.3 x 0.9 cm.  5 x 4 mm right middle lobe nodule.  2.8 x 1.9 cm left adrenal lesion.    Reamings from the right femur showed metastatic poorly differentiated carcinoma.  Immunohistochemistry showed was positive for pancytokeratin, CK7 and patchy CK20 with patchy dim expression of TTF-1.  Cells negative for Melan-A, CDX2, PAX8, Napsin A, GATA3, p40, CD56, p16 and thyroglobulin.  Findings compatible with metastatic carcinoma but because of decalcification immunohistochemical staining is unreliable.  Patchy dim staining with TTF-1 suspicious for lung primary but not a definitive diagnosis.   Repeat  supraclavicular lymph node biopsy showed metastatic adenocarcinoma.  Tumor cells positive for CK7 with focal weak staining for TTF-1.  Suggestive of lung origin in the proper clinical context.  Cells were negative for GATA3 PAX8 CDX2 and CK20 and Napsin A.  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  She is currently on hydrocortisone twice daily.  Thyroid functions presently are normal    Interval history- patient reports occasional pain in her left thigh which has been bothering her for the last couple of weeks symptoms are intermittent.  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  She still gets loose watery stools but they are more manageable.  Denies any blood in her stools  ECOG PS- 1 Pain scale- 4 Opioid associated constipation- no  Review of systems- Review of Systems  Constitutional:  Positive for malaise/fatigue. Negative for chills, fever and weight loss.  HENT:  Negative for congestion, ear discharge and nosebleeds.   Eyes:  Negative for blurred vision.  Respiratory:  Negative for cough, hemoptysis, sputum production, shortness of breath and wheezing.   Cardiovascular:  Negative for chest pain, palpitations, orthopnea and claudication.  Gastrointestinal:  Negative for abdominal pain, blood in stool, constipation, diarrhea, heartburn, melena, nausea and vomiting.  Genitourinary:  Negative for dysuria, flank pain, frequency, hematuria and urgency.  Musculoskeletal:  Negative for back pain, joint pain and myalgias.       Bilateral lower extremity pain  Skin:  Negative for rash.  Neurological:  Negative for dizziness, tingling, focal weakness, seizures, weakness and headaches.  Endo/Heme/Allergies:  Does not bruise/bleed easily.  Psychiatric/Behavioral:  Negative for depression and suicidal ideas. The patient does not have insomnia.        Allergies  Allergen Reactions   Factive [Gemifloxacin] Rash     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   Palpitations    Precordial chest pain    Wrist fracture 03/2021   left     Past Surgical History:  Procedure Laterality Date   BREAST BIOPSY Right 2017   benign   CERVICAL BIOPSY  W/ LOOP ELECTRODE EXCISION     COLONOSCOPY  06/11/2022   Procedure: COLONOSCOPY;  Surgeon: Lin Landsman, MD;  Location: [facility] ENDOSCOPY;  Service: Gastroenterology;;   COLONOSCOPY WITH PROPOFOL N/A 07/03/2015   Procedure: COLONOSCOPY WITH PROPOFOL;  Surgeon: Hulen Luster, MD;  Location: [facility] ENDOSCOPY;  Service: Gastroenterology;  Laterality: N/A;   COLONOSCOPY WITH PROPOFOL N/A 01/06/2022   Procedure: COLONOSCOPY WITH PROPOFOL;  Surgeon: Lin Landsman, MD;  Location: [facility] ENDOSCOPY;  Service: Gastroenterology;  Laterality: N/A;   ESOPHAGOGASTRODUODENOSCOPY (EGD) WITH PROPOFOL N/A 07/03/2015   Procedure: ESOPHAGOGASTRODUODENOSCOPY (EGD) WITH PROPOFOL;  Surgeon: Hulen Luster, MD;  Location: [facility] ENDOSCOPY;  Service: Gastroenterology;  Laterality: N/A;   FLEXIBLE SIGMOIDOSCOPY N/A 03/12/2022   Procedure: FLEXIBLE SIGMOIDOSCOPY;  Surgeon: Lin Landsman, MD;  Location: [facility] ENDOSCOPY;  Service: Gastroenterology;  Laterality: N/A;   FRACTURE SURGERY     INTRAMEDULLARY (IM) NAIL INTERTROCHANTERIC Right 09/15/2020   Procedure: INTRAMEDULLARY (IM) NAIL INTERTROCHANTRIC;  Surgeon: Corky Mull, MD;  Location: [facility] ORS;  Service: Orthopedics;  Laterality: Right;   IR CV LINE INJECTION  07/14/2022   IR IMAGING GUIDED PORT INSERTION  11/28/2020   LEFT HEART CATH AND CORONARY ANGIOGRAPHY N/A 03/28/2017   Procedure: Left Heart Cath and Coronary Angiography;   Surgeon: Lorretta Harp, MD;  Location: [place] CV LAB;  Service: Cardiovascular;  Laterality: N/A;   ORIF WRIST FRACTURE Left 03/31/2021   Procedure: OPEN REDUCTION INTERNAL FIXATION (ORIF) LEFT DISTAL RADIUS FRACTURE.;  Surgeon: Corky Mull, MD;  Location: [facility] ORS;  Service: Orthopedics;  Laterality: Left;    Social History   Socioeconomic History   Marital status: Married    Spouse name: Gene   Number of children: 2   Years of education: Not on file   Highest education level: Not on file  Occupational History   Not on file  Tobacco Use   Smoking status: Former    Packs/day: 1.00    Years: 30.00    Total pack years: 30.00    Types: Cigarettes    Quit date: 09/17/2020    Years since quitting: 1.8   Smokeless tobacco: Never  Vaping Use   Vaping Use: Never used  Substance and Sexual Activity   Alcohol use: Not Currently    Alcohol/week: 1.0 standard drink of alcohol    Types: 1 Standard drinks or equivalent per week    Comment: beer occ.   Drug use: No   Sexual activity: Yes    Birth control/protection: Post-menopausal  Other Topics Concern   Not on file  Social History Narrative   Lives in [place] with her husband.  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  Does  not routinely exercise.   Social Determinants of Health   Financial Resource Strain: Not on file  Food Insecurity: Not on file  Transportation Needs: Not on file  Physical Activity: Not on file  Stress: Not on file  Social Connections: Not on file  Intimate Partner Violence: Not on file    Family History  Problem Relation Age of Onset   Diabetes Mother        alive @ 69   Goiter Mother    Heart disease Father        died of MI @ 53   Osteoporosis Maternal Grandmother    Colon cancer Maternal Grandfather    Breast cancer Neg Hx    Ovarian cancer Neg Hx      Current Outpatient Medications:    ALPRAZolam (XANAX) 0.5 MG tablet, TAKE ONE TABLET DAILY AS NEEDED, Disp: 30  tablet, Rfl: 0   amoxicillin-clavulanate (AUGMENTIN) 875-125 MG tablet, Take 1 tablet by mouth 2 (two) times daily., Disp: , Rfl:    famotidine (PEPCID) 20 MG tablet, Take 20 mg by mouth in the morning., Disp: , Rfl:    folic acid (FOLVITE) 1 MG tablet, Take 1 tablet (1 mg total) by mouth daily. Start 7 days before pemetrexed chemotherapy. Continue until 21 days after pemetrexed completed., Disp: 100 tablet, Rfl: 3   hydrocortisone (CORTEF) 10 MG tablet, TAKE 1 TABLET BY MOUTH NIGHTLY, Disp: 30 tablet, Rfl: 3   hydrocortisone (CORTEF) 20 MG tablet, TAKE 1 TABLET BY MOUTH EVERY MORNING, Disp: 30 tablet, Rfl: 3   lidocaine-prilocaine (EMLA) cream, Apply to affected area once, Disp: 30 g, Rfl: 3   metoprolol tartrate (LOPRESSOR) 25 MG tablet, Take 25 mg by mouth in the morning., Disp: , Rfl:    ondansetron (ZOFRAN) 4 MG tablet, TAKE 1 TABLET BY MOUTH EVERY 8 HOURS AS NEEDED FOR NAUSEA AND VOMITING, Disp: 20 tablet, Rfl: 0   oxyCODONE (OXY IR/ROXICODONE) 5 MG immediate release tablet, TAKE 1-2 TABLETS BY MOUTH EVERY 4 HOURS AS NEEDED FOR MODERATE PAIN, Disp: 60 tablet, Rfl: 0   prochlorperazine (COMPAZINE) 10 MG tablet, Take 1 tablet (10 mg total) by mouth every 6 (six) hours as needed for nausea or vomiting., Disp: 30 tablet, Rfl: 1 No current facility-administered medications for this visit.  Facility-Administered Medications Ordered in Other Visits:    heparin lock flush 100 unit/mL, 500 Units, Intravenous, Once, Sindy Guadeloupe, MD   sodium chloride flush (NS) 0.9 % injection 10 mL, 10 mL, Intravenous, PRN, Sindy Guadeloupe, MD, 10 mL at 03/09/21 0906  Physical exam:  Vitals:   07/30/22 0917  BP: 131/86  Pulse: 90  Resp: 18  Temp: 98 F (36.7 C)  SpO2: 99%  Weight: 200 lb 14.4 oz (91.1 kg)   Physical Exam Constitutional:      General: She is not in acute distress. Cardiovascular:     Rate and Rhythm: Normal rate and regular rhythm.     Heart sounds: Normal heart sounds.  Pulmonary:      Effort: Pulmonary effort is normal.     Breath sounds: Normal breath sounds.  Skin:    General: Skin is warm and dry.  Neurological:     Mental Status: She is alert and oriented to person, place, and time.         Latest Ref Rng & Units 07/30/2022    9:00 AM  CMP  Glucose 70 - 99 mg/dL 130   BUN 6 - 20 mg/dL 10  Creatinine 0.44 - 1.00 mg/dL 0.74   Sodium 135 - 145 mmol/L 136   Potassium 3.5 - 5.1 mmol/L 3.5   Chloride 98 - 111 mmol/L 105   CO2 22 - 32 mmol/L 23   Calcium 8.9 - 10.3 mg/dL 8.6   Total Protein 6.5 - 8.1 g/dL 6.5   Total Bilirubin 0.3 - 1.2 mg/dL 0.5   Alkaline Phos 38 - 126 U/L 85   AST 15 - 41 U/L 21   ALT 0 - 44 U/L 13       Latest Ref Rng & Units 07/30/2022    9:00 AM  CBC  WBC 4.0 - 10.5 K/uL 11.0   Hemoglobin 12.0 - 15.0 g/dL 11.7   Hematocrit 36.0 - 46.0 % 36.8   Platelets 150 - 400 K/uL 415     No images are attached to the encounter.  NM Bone Scan Whole Body  Result Date: 07/15/2022 CLINICAL DATA:  Non-small cell lung cancer, osseous metastatic disease. Follow-up examination., EXAM: NUCLEAR MEDICINE WHOLE BODY BONE SCAN TECHNIQUE: Whole body anterior and posterior images were obtained approximately 3 hours after intravenous injection of radiopharmaceutical. RADIOPHARMACEUTICALS:  23.44 mCi Technetium-66mMDP IV COMPARISON:  02/22/2022, 11/11/2021 FINDINGS: Uptake within the left anterior iliac crest and proximal right femur is again identified in keeping with known osseous metastases and appears stable since prior examination. There has, however, developed new focal uptake within the right fifth and 6 ribs later anterolaterally, left seventh rib posterolaterally, right superior pubic ramus, and left superior iliac crest which are suspicious for new osseous metastatic disease. There is, additionally, increasing asymmetric focal uptake of radiotracer within the distal left clavicle. These do not demonstrate a correlate finding on CT examination of  06/03/2022. Not included on prior CT examination is a focal lesion within the distal right femur which demonstrates increased uptake of radiotracer since remote prior examination of 11/11/2021 which may represent progressive osseous metastatic disease or postsurgical change given right femoral ORIF. Uptake within the shoulders, knees, and feet bilaterally are again identified, likely degenerative in nature.q IMPRESSION: Interval development of multiple foci of radiotracer uptake in keeping with progressive osseous metastatic disease as outlined above. Electronically Signed   By: AFidela SalisburyM.D.   On: 07/15/2022 20:53   IR CV Line Injection  Result Date: 07/14/2022 INDICATION: Port malfunction EXAM: Port injection using fluoroscopy MEDICATIONS: None ANESTHESIA/SEDATION: None FLUOROSCOPY TIME:  Fluoroscopy Time: 0.2 minutes (3 mGy) COMPLICATIONS: None immediate. PROCEDURE: Informed written consent was obtained from the patient after a thorough discussion of the procedural risks, benefits and alternatives. All questions were addressed. Maximal Sterile Barrier Technique was utilized including caps, mask, sterile gowns, sterile gloves, sterile drape, hand hygiene and skin antiseptic. A timeout was performed prior to the initiation of the procedure. The right chest port was accessed using standard sterile technique. The port was found to aspirate and flush appropriately. Injection of the right chest port was performed under fluoroscopy. This demonstrates a right chest port which appears to enter via the right internal jugular vein. The port catheter terminates near the superior cavoatrial junction. The catheter is continuous without findings of discontinuity or kink. Additional DSA angiography was performed of the distal tip of the catheter. This demonstrates free flow of contrast without findings of a fibrin sheath. IMPRESSION: Normally functioning right chest port. The port aspirates and flushes appropriately.  No findings of a fibrin sheath. Electronically Signed   By: YAlbin FellingM.D.   On: 07/14/2022 14:22  Assessment and plan- Patient is a 56 y.o. female with metastatic adenocarcinoma of the lung with bone metastases here for on treatment assessment prior to next cycle of Alimta and to discuss CT scan results and further management  I have reviewed CT chest abdomen and pelvis as well as bone scan images independently and discussed findings with the patient.  CT chest shows mild progression of her hilar and mediastinal adenopathy but otherwise no other progressive disease.  Bone scan and CT scan however do show progression in her ribs as well as pelvis.  Explained to the patient that this could be because we have not been able to give her Alimta between June and September 2023 when she was going through uncontrolled autoimmune colitis.  I do not plan to change her treatment at this time and will continue to offer Alimta every 3 weeks and get a short interval CT scan probably in 2 to 3 months time.  Bone mets: She will receive Zometa today  Neoplasm related pain: She will continue as needed oxycodone.  Because of her left thigh pain is currently unclear.  Bone scan did not show any uptake in that area.  We did discuss introduction of gabapentin but she would like to hold off for now.  She will continue as needed Tylenol and have asked her to reduce intake of NSAIDs in general.  Patient verbalized understanding  She will proceed with Alimta and Zometa today and I will see her back in 3 weeks for Alimta and B12 injection   Visit Diagnosis 1. Primary malignant neoplasm of right lung metastatic to other site [facility])   2. Encounter for antineoplastic chemotherapy   3. Goals of care, counseling/discussion   4. Encounter for monitoring zoledronic acid therapy      Dr. Randa Evens, MD, MPH [facility] at [facility] [phone] 07/30/2022 12:59 PM

## 2022-07-30 NOTE — Patient Instructions (Signed)
[facility]CER CTR AT [place]  Discharge Instructions: Thank you for choosing [facility] to provide your oncology and hematology care.  If you have a lab appointment with the [facility], please go directly to the [facility] and check in at the registration area.  Wear comfortable clothing and clothing appropriate for easy access to any Portacath or PICC line.   We strive to give you quality time with your provider. You may need to reschedule your appointment if you arrive late (15 or more minutes).  Arriving late affects you and other patients whose appointments are after yours.  Also, if you miss three or more appointments without notifying the office, you may be dismissed from the clinic at the provider's discretion.      For prescription refill requests, have your pharmacy contact our office and allow 72 hours for refills to be completed.    Today you received the following chemotherapy and/or immunotherapy agents Zometa & Alimta      To help prevent nausea and vomiting after your treatment, we encourage you to take your nausea medication as directed.  BELOW ARE SYMPTOMS THAT SHOULD BE REPORTED IMMEDIATELY: *FEVER GREATER THAN 100.4 F (38 C) OR HIGHER *CHILLS OR SWEATING *NAUSEA AND VOMITING THAT IS NOT CONTROLLED WITH YOUR NAUSEA MEDICATION *UNUSUAL SHORTNESS OF BREATH *UNUSUAL BRUISING OR BLEEDING *URINARY PROBLEMS (pain or burning when urinating, or frequent urination) *BOWEL PROBLEMS (unusual diarrhea, constipation, pain near the anus) TENDERNESS IN MOUTH AND THROAT WITH OR WITHOUT PRESENCE OF ULCERS (sore throat, sores in mouth, or a toothache) UNUSUAL RASH, SWELLING OR PAIN  UNUSUAL VAGINAL DISCHARGE OR ITCHING   Items with * indicate a potential emergency and should be followed up as soon as possible or go to the Emergency Department if any problems should occur.  Please show the CHEMOTHERAPY ALERT CARD or IMMUNOTHERAPY ALERT CARD at  check-in to the Emergency Department and triage nurse.  Should you have questions after your visit or need to cancel or reschedule your appointment, please contact [facility] CANCER [place] AT [place]  [phone] and follow the prompts.  Office hours are 8:00 a.m. to 4:30 p.m. Monday - Friday. Please note that voicemails left after 4:00 p.m. may not be returned until the following business day.  We are closed weekends and major holidays. You have access to a nurse at all times for urgent questions. Please call the main number to the clinic [phone] and follow the prompts.  For any non-urgent questions, you may also contact your provider using MyChart. We now offer e-Visits for anyone 41 and older to request care online for non-urgent symptoms. For details visit [email or web address].   Also download the MyChart app! Go to the app store, search "MyChart", open the app, select [facility], and log in with your MyChart username and password.  Masks are optional in the cancer centers. If you would like for your care team to wear a mask while they are taking care of you, please let them know. For doctor visits, patients may have with them one support person who is at least 55 years old. At this time, visitors are not allowed in the infusion area.

## 2022-07-30 NOTE — Progress Notes (Signed)
Ear infection/fluid: currently on AUGMENTIN for the next two days. As well as, pt is dealing with pain in her legs.

## 2022-07-31 ENCOUNTER — Other Ambulatory Visit: Payer: Self-pay

## 2022-08-02 ENCOUNTER — Telehealth: Payer: Self-pay

## 2022-08-02 ENCOUNTER — Other Ambulatory Visit: Payer: Self-pay

## 2022-08-02 ENCOUNTER — Encounter: Payer: Self-pay | Admitting: Gastroenterology

## 2022-08-02 NOTE — Telephone Encounter (Signed)
Patient is calling because she has been on antibiotics. She states she can not stop coughing. Moved patient to 08/17/2022. Called Endo and talk to vickie and she will get patient moved

## 2022-08-04 ENCOUNTER — Other Ambulatory Visit: Payer: Self-pay | Admitting: Oncology

## 2022-08-04 ENCOUNTER — Other Ambulatory Visit: Payer: Self-pay | Admitting: Medical Oncology

## 2022-08-04 MED ORDER — ONDANSETRON HCL 4 MG PO TABS: ORAL_TABLET | ORAL | 1 refills | Status: DC

## 2022-08-12 ENCOUNTER — Other Ambulatory Visit: Payer: Self-pay

## 2022-08-17 ENCOUNTER — Encounter
Admission: RE | Disposition: A | Discharge status: Home / Self Care | Payer: Self-pay | Source: Home / Self Care | Attending: Gastroenterology

## 2022-08-17 ENCOUNTER — Ambulatory Visit: Payer: 59 | Admitting: Anesthesiology

## 2022-08-17 ENCOUNTER — Other Ambulatory Visit: Payer: Self-pay

## 2022-08-17 ENCOUNTER — Ambulatory Visit
Admission: RE | Admit: 2022-08-17 | Discharge: 2022-08-17 | Disposition: A | Discharge status: Home / Self Care | Payer: 59 | Attending: Gastroenterology | Admitting: Gastroenterology

## 2022-08-17 ENCOUNTER — Encounter: Payer: Self-pay | Admitting: Gastroenterology

## 2022-08-17 DIAGNOSIS — K523 Indeterminate colitis: Secondary | ICD-10-CM

## 2022-08-17 DIAGNOSIS — K51919 Ulcerative colitis, unspecified with unspecified complications: Secondary | ICD-10-CM | POA: Diagnosis not present

## 2022-08-17 DIAGNOSIS — K521 Toxic gastroenteritis and colitis: Secondary | ICD-10-CM | POA: Diagnosis not present

## 2022-08-17 DIAGNOSIS — K6389 Other specified diseases of intestine: Secondary | ICD-10-CM | POA: Diagnosis not present

## 2022-08-17 DIAGNOSIS — K219 Gastro-esophageal reflux disease without esophagitis: Secondary | ICD-10-CM | POA: Insufficient documentation

## 2022-08-17 DIAGNOSIS — Z87891 Personal history of nicotine dependence: Secondary | ICD-10-CM | POA: Diagnosis not present

## 2022-08-17 DIAGNOSIS — I10 Essential (primary) hypertension: Secondary | ICD-10-CM | POA: Diagnosis not present

## 2022-08-17 DIAGNOSIS — R69 Illness, unspecified: Secondary | ICD-10-CM | POA: Diagnosis not present

## 2022-08-17 HISTORY — PX: FLEXIBLE SIGMOIDOSCOPY: SHX5431

## 2022-08-17 SURGERY — SIGMOIDOSCOPY, FLEXIBLE
Anesthesia: General

## 2022-08-17 MED ORDER — PROPOFOL 10 MG/ML IV BOLUS
INTRAVENOUS | Status: AC
  Filled 2022-08-17: qty 20

## 2022-08-17 MED ORDER — PROPOFOL 500 MG/50ML IV EMUL
INTRAVENOUS | Status: DC | PRN
  Administered 2022-08-17: 150 ug/kg/min via INTRAVENOUS

## 2022-08-17 MED ORDER — SODIUM CHLORIDE 0.9 % IV SOLN: INTRAVENOUS | Status: DC

## 2022-08-17 MED ORDER — PROPOFOL 10 MG/ML IV BOLUS
INTRAVENOUS | Status: DC | PRN
  Administered 2022-08-17: 30 mg via INTRAVENOUS
  Administered 2022-08-17: 50 mg via INTRAVENOUS

## 2022-08-17 MED ORDER — ONDANSETRON HCL 4 MG/2ML IJ SOLN
INTRAMUSCULAR | Status: AC
  Administered 2022-08-17: 4 mg via INTRAVENOUS
  Filled 2022-08-17: qty 2

## 2022-08-17 MED ORDER — ONDANSETRON HCL 4 MG/2ML IJ SOLN: 4.0000 mg | Freq: Once | INTRAMUSCULAR | Status: AC

## 2022-08-17 NOTE — Op Note (Signed)
[facility] Patient Name: Gina Sanchez Procedure Date: 08/17/2022 12:24 PM MRN: [id number] Account #: [id number] Date of Birth: 03/09/1966 Admit Type: Outpatient Age: 56 Room: [facility] ENDO ROOM 3 Gender: Female Note Status: Finalized Instrument Name: Peds Colonoscope [id number] Procedure:             Flexible Sigmoidoscopy Indications:           Follow-up of immune mediated colitis, on entyvio Providers:             Lin Landsman MD, MD Referring MD:          Jordan Likes. Lavena Bullion (Referring MD) Medicines:             General Anesthesia Complications:         No immediate complications. Estimated blood loss: None. Procedure:             Pre-Anesthesia Assessment:                        - Prior to the procedure, a History and Physical was                         performed, and patient medications and allergies were                         reviewed. The patient is competent. The risks and                         benefits of the procedure and the sedation options and                         risks were discussed with the patient. All questions                         were answered and informed consent was obtained.                         Patient identification and proposed procedure were                         verified by the physician, the nurse, the                         anesthesiologist, the anesthetist and the technician                         in the pre-procedure area in the procedure room in the                         endoscopy suite. Mental Status Examination: alert and                         oriented. Airway Examination: normal oropharyngeal                         airway and neck mobility. Respiratory Examination:                         clear to auscultation. CV Examination: normal.  Prophylactic Antibiotics: The patient does not require                         prophylactic antibiotics. Prior Anticoagulants: The                          patient has taken no anticoagulant or antiplatelet                         agents. ASA Grade Assessment: III - A patient with                         severe systemic disease. After reviewing the risks and                         benefits, the patient was deemed in satisfactory                         condition to undergo the procedure. The anesthesia                         plan was to use general anesthesia. Immediately prior                         to administration of medications, the patient was                         re-assessed for adequacy to receive sedatives. The                         heart rate, respiratory rate, oxygen saturations,                         blood pressure, adequacy of pulmonary ventilation, and                         response to care were monitored throughout the                         procedure. The physical status of the patient was                         re-assessed after the procedure.                        After obtaining informed consent, the scope was passed                         under direct vision. The Colonoscope was introduced                         through the anus and advanced to the the left                         transverse colon. The flexible sigmoidoscopy was                         accomplished without difficulty. The patient tolerated  the procedure well. The quality of the bowel                         preparation was adequate. Findings:      The perianal and digital rectal examinations were normal. Pertinent       negatives include normal sphincter tone and no palpable rectal lesions.      Diffuse mild inflammation characterized by erythema, friability and       mucus was found in the recto-sigmoid colon, in the sigmoid colon and in       the descending colon. Biopsies were taken with a cold forceps for       histology. Appears improved compared to previous images Impression:            -  Diffuse mild inflammation was found in the                         recto-sigmoid colon, in the sigmoid colon and in the                         descending colon. Biopsied. Recommendation:        - Discharge patient to home (with escort).                        - Resume previous diet today.                        - Await pathology results.                        - Continue present medications. Procedure Code(s):     --- Professional ---                        [phone], Sigmoidoscopy, flexible; with biopsy, single or                         multiple Diagnosis Code(s):     --- Professional ---                        K52.9, Noninfective gastroenteritis and colitis,                         unspecified                        K51.90, Ulcerative colitis, unspecified, without                         complications CPT copyright 2022 American Medical Association. All rights reserved. The codes documented in this report are preliminary and upon coder review may  be revised to meet current compliance requirements. Dr. Ulyess Mort Lin Landsman MD, MD 08/17/2022 12:48:30 PM This report has been signed electronically. Number of Addenda: 0 Note Initiated On: 08/17/2022 12:24 PM Total Procedure Duration: 0 hours 11 minutes 49 seconds  Estimated Blood Loss:  Estimated blood loss: none.      [facility]

## 2022-08-17 NOTE — Transfer of Care (Signed)
Immediate Anesthesia Transfer of Care Note  Patient: Gina Sanchez  Procedure(s) Performed: FLEXIBLE SIGMOIDOSCOPY  Patient Location: PACU and Endoscopy Unit  Anesthesia Type:General  Level of Consciousness: drowsy and patient cooperative  Airway & Oxygen Therapy: Patient Spontanous Breathing  Post-op Assessment: Report given to RN and Post -op Vital signs reviewed and stable  Post vital signs: Reviewed and stable  Last Vitals:  Vitals Value Taken Time  BP 101/68 08/17/22 1247  Temp    Pulse 73 08/17/22 1247  Resp 16 08/17/22 1247  SpO2 99 % 08/17/22 1247  Vitals shown include unvalidated device data.  Last Pain:  Vitals:   08/17/22 1120  TempSrc: Temporal  PainSc: 0-No pain         Complications: No notable events documented.

## 2022-08-17 NOTE — H&P (Signed)
Gina Darby, MD [address]  [place]  [place], [place] [address]  Main: [phone]  Fax: [phone] Pager: [phone]  Primary Care Physician:  Gina Haggard, FNP Primary Gastroenterologist:  Dr. Cephas Sanchez  Pre-Procedure History & Physical: HPI:  Gina Sanchez is a 56 y.o. female is here for an flexible sigmoidoscopy.   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   Palpitations    Precordial chest pain    Wrist fracture 03/2021   left    Past Surgical History:  Procedure Laterality Date   BREAST BIOPSY Right 2017   benign   CERVICAL BIOPSY  W/ LOOP ELECTRODE EXCISION     COLONOSCOPY  06/11/2022   Procedure: COLONOSCOPY;  Surgeon: Gina Landsman, MD;  Location: [facility] ENDOSCOPY;  Service: Gastroenterology;;   COLONOSCOPY WITH PROPOFOL N/A 07/03/2015   Procedure: COLONOSCOPY WITH PROPOFOL;  Surgeon: Gina Luster, MD;  Location: [facility] ENDOSCOPY;  Service: Gastroenterology;  Laterality: N/A;   COLONOSCOPY WITH PROPOFOL N/A 01/06/2022   Procedure: COLONOSCOPY WITH PROPOFOL;  Surgeon: Gina Landsman, MD;  Location: [facility] ENDOSCOPY;  Service: Gastroenterology;  Laterality: N/A;   ESOPHAGOGASTRODUODENOSCOPY (EGD) WITH PROPOFOL N/A 07/03/2015   Procedure: ESOPHAGOGASTRODUODENOSCOPY (EGD) WITH PROPOFOL;  Surgeon: Gina Luster, MD;  Location: [facility] ENDOSCOPY;  Service: Gastroenterology;  Laterality: N/A;   FLEXIBLE SIGMOIDOSCOPY N/A 03/12/2022   Procedure: FLEXIBLE SIGMOIDOSCOPY;  Surgeon: Gina Landsman, MD;  Location: [facility] ENDOSCOPY;  Service: Gastroenterology;  Laterality: N/A;   FRACTURE SURGERY     INTRAMEDULLARY (IM) NAIL INTERTROCHANTERIC Right 09/15/2020   Procedure: INTRAMEDULLARY (IM) NAIL INTERTROCHANTRIC;   Surgeon: Gina Mull, MD;  Location: [facility] ORS;  Service: Orthopedics;  Laterality: Right;   IR CV LINE INJECTION  07/14/2022   IR IMAGING GUIDED PORT INSERTION  11/28/2020   LEFT HEART CATH AND CORONARY ANGIOGRAPHY N/A 03/28/2017   Procedure: Left Heart Cath and Coronary Angiography;  Surgeon: Gina Harp, MD;  Location: [place] CV LAB;  Service: Cardiovascular;  Laterality: N/A;   ORIF WRIST FRACTURE Left 03/31/2021   Procedure: OPEN REDUCTION INTERNAL FIXATION (ORIF) LEFT DISTAL RADIUS FRACTURE.;  Surgeon: Gina Mull, MD;  Location: [facility] ORS;  Service: Orthopedics;  Laterality: Left;    Prior to Admission medications   Medication Sig Start Date End Date Taking? Authorizing Provider  ALPRAZolam Duanne Moron) 0.5 MG tablet TAKE ONE TABLET DAILY AS NEEDED 08/04/22  Yes Gina Guadeloupe, MD  metoprolol tartrate (LOPRESSOR) 25 MG tablet Take 25 mg by mouth in the morning. 11/11/20  Yes [provider]  amoxicillin-clavulanate (AUGMENTIN) 875-125 MG tablet Take 1 tablet by mouth 2 (two) times daily. 07/27/22   [provider]  famotidine (PEPCID) 20 MG tablet Take 20 mg by mouth in the morning.    [provider]  folic acid (FOLVITE) 1 MG tablet Take 1 tablet (1 mg total) by mouth daily. Start 7 days before pemetrexed chemotherapy. Continue until 21 days after pemetrexed completed. 06/08/22   Gina Guadeloupe, MD  hydrocortisone (CORTEF) 10 MG tablet TAKE 1 TABLET BY MOUTH NIGHTLY 06/22/22   Gina Guadeloupe, MD  hydrocortisone (CORTEF) 20 MG tablet TAKE 1 TABLET BY MOUTH EVERY  MORNING 06/09/22   Gina Guadeloupe, MD  lidocaine-prilocaine (EMLA) cream Apply to affected area once 06/08/22   Gina Guadeloupe, MD  ondansetron (ZOFRAN) 4 MG tablet TAKE 1 TABLET BY MOUTH EVERY 8 HOURS AS NEEDED FOR NAUSEA AND VOMITING 08/04/22   Gina Closs, PA-C  oxyCODONE (OXY IR/ROXICODONE) 5 MG immediate release tablet TAKE 1-2 TABLETS BY MOUTH EVERY 4 HOURS AS NEEDED FOR MODERATE PAIN  08/04/22   Gina Guadeloupe, MD  prochlorperazine (COMPAZINE) 10 MG tablet Take 1 tablet (10 mg total) by mouth every 6 (six) hours as needed for nausea or vomiting. 06/08/22   Gina Guadeloupe, MD    Allergies as of 06/30/2022 - Review Complete 06/11/2022  Allergen Reaction Noted   Factive [gemifloxacin] Rash 07/03/2015    Family History  Problem Relation Age of Onset   Diabetes Mother        alive @ 76   Goiter Mother    Heart disease Father        died of MI @ 68   Osteoporosis Maternal Grandmother    Colon cancer Maternal Grandfather    Breast cancer Neg Hx    Ovarian cancer Neg Hx     Social History   Socioeconomic History   Marital status: Married    Spouse name: Gene   Number of children: 2   Years of education: Not on file   Highest education level: Not on file  Occupational History   Not on file  Tobacco Use   Smoking status: Former    Packs/day: 1.00    Years: 30.00    Total pack years: 30.00    Types: Cigarettes    Quit date: 09/17/2020    Years since quitting: 1.9   Smokeless tobacco: Never  Vaping Use   Vaping Use: Never used  Substance and Sexual Activity   Alcohol use: Not Currently    Alcohol/week: 1.0 standard drink of alcohol    Types: 1 Standard drinks or equivalent per week    Comment: beer occ.   Drug use: No   Sexual activity: Yes    Birth control/protection: Post-menopausal  Other Topics Concern   Not on file  Social History Narrative   Lives in [place] with her husband.  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   Social Determinants of Health   Financial Resource Strain: Not on file  Food Insecurity: Not on file  Transportation Needs: Not on file  Physical Activity: Not on file  Stress: Not on file  Social Connections: Not on file  Intimate Partner Violence: Not on file    Review of Systems: See HPI, otherwise negative ROS  Physical Exam: BP 120/85   Pulse 71   Temp (!) 95.7 F (35.4  C) (Temporal)   Resp 16   Ht 5' 6"  (1.676 m)   Wt 88.5 kg   LMP 09/15/2018   SpO2 100%   BMI 31.47 kg/m  General:   Alert,  pleasant and cooperative in NAD Head:  Normocephalic and atraumatic. Neck:  Supple; no masses or thyromegaly. Lungs:  Clear throughout to auscultation.    Heart:  Regular rate and rhythm. Abdomen:  Soft, nontender and nondistended. Normal bowel sounds, without guarding, and without rebound.   Neurologic:  Alert and  oriented x4;  grossly normal neurologically.  Impression/Plan: Emi Belfast is here for an flexible sigmoidoscopy to be performed for immune mediated colitis  Risks, benefits, limitations, and  alternatives regarding  flexible sigmoidoscopy have been reviewed with the patient.  Questions have been answered.  All parties agreeable.   Sherri Sear, MD  08/17/2022, 11:32 AM

## 2022-08-17 NOTE — Anesthesia Preprocedure Evaluation (Signed)
Anesthesia Evaluation  Patient identified by MRN, date of birth, ID band Patient awake    Reviewed: Allergy & Precautions, NPO status , Patient's Chart, lab work & pertinent test results  History of Anesthesia Complications Negative for: history of anesthetic complications  Airway Mallampati: III  TM Distance: >3 FB Neck ROM: full    Dental  (+) Dental Advidsory Given, Poor Dentition   Pulmonary neg shortness of breath, neg sleep apnea, neg COPD, neg recent URI, former smoker Lung cancer, currently doing chemo   Pulmonary exam normal        Cardiovascular hypertension, (-) angina (-) Past MI and (-) Cardiac Stents Normal cardiovascular exam(-) dysrhythmias (-) Valvular Problems/Murmurs     Neuro/Psych  PSYCHIATRIC DISORDERS Anxiety Depression    negative neurological ROS     GI/Hepatic Neg liver ROS,GERD  Medicated and Controlled,,  Endo/Other  negative endocrine ROS    Renal/GU negative Renal ROS  negative genitourinary   Musculoskeletal   Abdominal   Peds  Hematology negative hematology ROS (+)   Anesthesia Other Findings Past Medical History: No date: Abnormal Pap smear of cervix No date: Anxiety No date: Depression No date: Diverticulosis No date: Essential hypertension No date: Femur fracture, right (HCC) No date: GERD (gastroesophageal reflux disease) No date: Lung cancer ([place])     Comment:  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 No date: Palpitations No date: Precordial chest pain 03/2021: Wrist fracture     Comment:  left  Past Surgical History: 2017: BREAST BIOPSY; Right     Comment:  benign No date: CERVICAL BIOPSY  W/ LOOP ELECTRODE EXCISION 07/03/2015: COLONOSCOPY WITH PROPOFOL; N/A     Comment:  Procedure: COLONOSCOPY WITH PROPOFOL;  Surgeon: Hulen Luster, MD;  Location: [facility] ENDOSCOPY;  Service:               Gastroenterology;  Laterality: N/A; 01/06/2022: COLONOSCOPY WITH  PROPOFOL; N/A     Comment:  Procedure: COLONOSCOPY WITH PROPOFOL;  Surgeon: Lin Landsman, MD;  Location: [facility] ENDOSCOPY;  Service:               Gastroenterology;  Laterality: N/A; 07/03/2015: ESOPHAGOGASTRODUODENOSCOPY (EGD) WITH PROPOFOL; N/A     Comment:  Procedure: ESOPHAGOGASTRODUODENOSCOPY (EGD) WITH               PROPOFOL;  Surgeon: Hulen Luster, MD;  Location: [facility]               ENDOSCOPY;  Service: Gastroenterology;  Laterality: N/A; 03/12/2022: FLEXIBLE SIGMOIDOSCOPY; N/A     Comment:  Procedure: FLEXIBLE SIGMOIDOSCOPY;  Surgeon: Lin Landsman, MD;  Location: [facility] ENDOSCOPY;  Service:               Gastroenterology;  Laterality: N/A; No date: FRACTURE SURGERY 09/15/2020: INTRAMEDULLARY (IM) NAIL INTERTROCHANTERIC; Right     Comment:  Procedure: INTRAMEDULLARY (IM) NAIL INTERTROCHANTRIC;                Surgeon: Corky Mull, MD;  Location: [facility] ORS;                Service: Orthopedics;  Laterality: Right; 11/28/2020: IR IMAGING GUIDED PORT INSERTION 03/28/2017: LEFT HEART CATH AND CORONARY ANGIOGRAPHY; N/A     Comment:  Procedure: Left  Heart Cath and Coronary Angiography;                Surgeon: Lorretta Harp, MD;  Location: [place] CV              LAB;  Service: Cardiovascular;  Laterality: N/A; 03/31/2021: ORIF WRIST FRACTURE; Left     Comment:  Procedure: OPEN REDUCTION INTERNAL FIXATION (ORIF) LEFT               DISTAL RADIUS FRACTURE.;  Surgeon: Corky Mull, MD;                Location: [facility] ORS;  Service: Orthopedics;  Laterality:               Left;  BMI    Body Mass Index: 30.99 kg/m      Reproductive/Obstetrics negative OB ROS                             Anesthesia Physical Anesthesia Plan  ASA: 3  Anesthesia Plan: General   Post-op Pain Management: Minimal or no pain anticipated   Induction: Intravenous  PONV Risk Score and Plan: 3 and Propofol infusion and TIVA  Airway  Management Planned: Nasal Cannula and Natural Airway  Additional Equipment: None  Intra-op Plan:   Post-operative Plan:   Informed Consent: I have reviewed the patients History and Physical, chart, labs and discussed the procedure including the risks, benefits and alternatives for the proposed anesthesia with the patient or authorized representative who has indicated his/her understanding and acceptance.     Dental advisory given  Plan Discussed with: CRNA and Surgeon  Anesthesia Plan Comments: (Discussed risks of anesthesia with patient, including possibility of difficulty with spontaneous ventilation under anesthesia necessitating airway intervention, PONV, and rare risks such as cardiac or respiratory or neurological events, and allergic reactions. Discussed the role of CRNA in patient's perioperative care. Patient understands.)        Anesthesia Quick Evaluation

## 2022-08-17 NOTE — Anesthesia Procedure Notes (Signed)
Procedure Name: MAC Date/Time: 08/17/2022 12:30 PM  Performed by: Jerrye Noble, CRNAPre-anesthesia Checklist: Patient identified, Emergency Drugs available, Suction available and Patient being monitored Patient Re-evaluated:Patient Re-evaluated prior to induction Oxygen Delivery Method: Nasal cannula

## 2022-08-18 ENCOUNTER — Encounter: Payer: Self-pay | Admitting: Gastroenterology

## 2022-08-18 LAB — SURGICAL PATHOLOGY

## 2022-08-19 NOTE — Anesthesia Postprocedure Evaluation (Signed)
Anesthesia Post Note  Patient: Gina Sanchez  Procedure(s) Performed: [place]  Patient location during evaluation: Endoscopy Anesthesia Type: General Level of consciousness: awake and alert Pain management: pain level controlled Vital Signs Assessment: post-procedure vital signs reviewed and stable Respiratory status: spontaneous breathing, nonlabored ventilation, respiratory function stable and patient connected to nasal cannula oxygen Cardiovascular status: blood pressure returned to baseline and stable Postop Assessment: no apparent nausea or vomiting Anesthetic complications: no   No notable events documented.   Last Vitals:  Vitals:   08/17/22 1308 08/17/22 1319  BP: 127/83 129/69  Pulse: 75 71  Resp: 17 19  Temp:    SpO2: 100% 100%    Last Pain:  Vitals:   08/18/22 0735  TempSrc:   PainSc: 0-No pain                 Martha Clan

## 2022-08-20 ENCOUNTER — Inpatient Hospital Stay: Payer: 59 | Attending: Oncology

## 2022-08-20 ENCOUNTER — Other Ambulatory Visit: Payer: Self-pay | Admitting: Oncology

## 2022-08-20 ENCOUNTER — Encounter: Payer: Self-pay | Admitting: Oncology

## 2022-08-20 DIAGNOSIS — Z79891 Long term (current) use of opiate analgesic: Secondary | ICD-10-CM | POA: Insufficient documentation

## 2022-08-20 DIAGNOSIS — E2740 Unspecified adrenocortical insufficiency: Secondary | ICD-10-CM | POA: Insufficient documentation

## 2022-08-20 DIAGNOSIS — C778 Secondary and unspecified malignant neoplasm of lymph nodes of multiple regions: Secondary | ICD-10-CM | POA: Insufficient documentation

## 2022-08-20 DIAGNOSIS — E538 Deficiency of other specified B group vitamins: Secondary | ICD-10-CM | POA: Diagnosis not present

## 2022-08-20 DIAGNOSIS — C7951 Secondary malignant neoplasm of bone: Secondary | ICD-10-CM | POA: Diagnosis not present

## 2022-08-20 DIAGNOSIS — K219 Gastro-esophageal reflux disease without esophagitis: Secondary | ICD-10-CM | POA: Diagnosis not present

## 2022-08-20 DIAGNOSIS — R490 Dysphonia: Secondary | ICD-10-CM | POA: Diagnosis not present

## 2022-08-20 DIAGNOSIS — R197 Diarrhea, unspecified: Secondary | ICD-10-CM | POA: Diagnosis not present

## 2022-08-20 DIAGNOSIS — Z87891 Personal history of nicotine dependence: Secondary | ICD-10-CM | POA: Insufficient documentation

## 2022-08-20 DIAGNOSIS — M810 Age-related osteoporosis without current pathological fracture: Secondary | ICD-10-CM | POA: Diagnosis not present

## 2022-08-20 DIAGNOSIS — C342 Malignant neoplasm of middle lobe, bronchus or lung: Secondary | ICD-10-CM | POA: Diagnosis present

## 2022-08-20 DIAGNOSIS — G893 Neoplasm related pain (acute) (chronic): Secondary | ICD-10-CM | POA: Diagnosis not present

## 2022-08-20 DIAGNOSIS — Z800 Family history of malignant neoplasm of digestive organs: Secondary | ICD-10-CM | POA: Insufficient documentation

## 2022-08-20 DIAGNOSIS — Z79899 Other long term (current) drug therapy: Secondary | ICD-10-CM | POA: Insufficient documentation

## 2022-08-20 DIAGNOSIS — I10 Essential (primary) hypertension: Secondary | ICD-10-CM | POA: Diagnosis not present

## 2022-08-20 DIAGNOSIS — Z5111 Encounter for antineoplastic chemotherapy: Secondary | ICD-10-CM | POA: Diagnosis present

## 2022-08-20 DIAGNOSIS — C7970 Secondary malignant neoplasm of unspecified adrenal gland: Secondary | ICD-10-CM | POA: Diagnosis not present

## 2022-08-20 DIAGNOSIS — C3491 Malignant neoplasm of unspecified part of right bronchus or lung: Secondary | ICD-10-CM

## 2022-08-20 DIAGNOSIS — Z803 Family history of malignant neoplasm of breast: Secondary | ICD-10-CM | POA: Diagnosis not present

## 2022-08-20 DIAGNOSIS — K5289 Other specified noninfective gastroenteritis and colitis: Secondary | ICD-10-CM | POA: Diagnosis not present

## 2022-08-20 MED ORDER — CYANOCOBALAMIN 1000 MCG/ML IJ SOLN
1000.0000 ug | INTRAMUSCULAR | Status: DC
  Administered 2022-08-20: 1000 ug via INTRAMUSCULAR
  Filled 2022-08-20: qty 1

## 2022-08-22 ENCOUNTER — Other Ambulatory Visit: Payer: Self-pay

## 2022-08-23 ENCOUNTER — Telehealth: Payer: Self-pay

## 2022-08-23 ENCOUNTER — Other Ambulatory Visit: Payer: Self-pay

## 2022-08-23 DIAGNOSIS — Z8619 Personal history of other infectious and parasitic diseases: Secondary | ICD-10-CM

## 2022-08-23 DIAGNOSIS — K523 Indeterminate colitis: Secondary | ICD-10-CM

## 2022-08-23 NOTE — Telephone Encounter (Signed)
-----   Message from Lin Landsman, MD sent at 08/20/2022 11:29 AM EST ----- Please inform patient that the pathology results from her colon shows chronic colitis with moderate activity.  Recommend to check C. Difficile and if this is negative, recommend to increase Entyvio to every 4 weeks  Please order C. difficile toxin A and B and C. difficile PCR  Rohini Vanga

## 2022-08-23 NOTE — Telephone Encounter (Signed)
Patient verbalized understanding of results. Order the stool kit and states she will come pick up the kits

## 2022-08-24 DIAGNOSIS — K523 Indeterminate colitis: Secondary | ICD-10-CM | POA: Diagnosis not present

## 2022-08-24 MED FILL — Dexamethasone Sodium Phosphate Inj 100 MG/10ML: INTRAMUSCULAR | Qty: 1 | Status: AC

## 2022-08-25 ENCOUNTER — Inpatient Hospital Stay (HOSPITAL_BASED_OUTPATIENT_CLINIC_OR_DEPARTMENT_OTHER): Payer: 59 | Admitting: Oncology

## 2022-08-25 ENCOUNTER — Inpatient Hospital Stay: Payer: 59

## 2022-08-25 ENCOUNTER — Encounter: Payer: Self-pay | Admitting: Oncology

## 2022-08-25 ENCOUNTER — Other Ambulatory Visit: Payer: Self-pay | Admitting: Gastroenterology

## 2022-08-25 VITALS — BP 125/90 | HR 85 | Temp 96.9°F | Ht 66.0 in | Wt 195.0 lb

## 2022-08-25 DIAGNOSIS — C3491 Malignant neoplasm of unspecified part of right bronchus or lung: Secondary | ICD-10-CM | POA: Diagnosis not present

## 2022-08-25 DIAGNOSIS — Z5111 Encounter for antineoplastic chemotherapy: Secondary | ICD-10-CM | POA: Diagnosis not present

## 2022-08-25 LAB — COMPREHENSIVE METABOLIC PANEL
ALT: 14 U/L (ref 0–44)
AST: 18 U/L (ref 15–41)
Albumin: 3.1 g/dL — ABNORMAL LOW (ref 3.5–5.0)
Alkaline Phosphatase: 153 U/L — ABNORMAL HIGH (ref 38–126)
Anion gap: 9 (ref 5–15)
BUN: 11 mg/dL (ref 6–20)
CO2: 24 mmol/L (ref 22–32)
Calcium: 8.3 mg/dL — ABNORMAL LOW (ref 8.9–10.3)
Chloride: 104 mmol/L (ref 98–111)
Creatinine, Ser: 0.73 mg/dL (ref 0.44–1.00)
GFR, Estimated: >60 mL/min (ref >60)
Glucose, Bld: 119 mg/dL — ABNORMAL HIGH (ref 70–99)
Potassium: 3.3 mmol/L — ABNORMAL LOW (ref 3.5–5.1)
Sodium: 137 mmol/L (ref 135–145)
Total Bilirubin: 0.5 mg/dL (ref 0.3–1.2)
Total Protein: 6.8 g/dL (ref 6.5–8.1)

## 2022-08-25 LAB — CBC WITH DIFFERENTIAL/PLATELET
Abs Immature Granulocytes: 0.09 10*3/uL — ABNORMAL HIGH (ref 0.00–0.07)
Basophils Absolute: 0.1 10*3/uL (ref 0.0–0.1)
Basophils Relative: 1 %
Eosinophils Absolute: 0.3 10*3/uL (ref 0.0–0.5)
Eosinophils Relative: 3 %
HCT: 34.5 % — ABNORMAL LOW (ref 36.0–46.0)
Hemoglobin: 11.1 g/dL — ABNORMAL LOW (ref 12.0–15.0)
Immature Granulocytes: 1 %
Lymphocytes Relative: 27 %
Lymphs Abs: 2.6 10*3/uL (ref 0.7–4.0)
MCH: 26.6 pg (ref 26.0–34.0)
MCHC: 32.2 g/dL (ref 30.0–36.0)
MCV: 82.5 fL (ref 80.0–100.0)
Monocytes Absolute: 0.8 10*3/uL (ref 0.1–1.0)
Monocytes Relative: 8 %
Neutro Abs: 5.9 10*3/uL (ref 1.7–7.7)
Neutrophils Relative %: 60 %
Platelets: 328 10*3/uL (ref 150–400)
RBC: 4.18 MIL/uL (ref 3.87–5.11)
RDW: 15.7 % — ABNORMAL HIGH (ref 11.5–15.5)
WBC: 9.6 10*3/uL (ref 4.0–10.5)
nRBC: 0 % (ref 0.0–0.2)

## 2022-08-25 MED ORDER — HEPARIN SOD (PORK) LOCK FLUSH 100 UNIT/ML IV SOLN
500.0000 [IU] | Freq: Once | INTRAVENOUS | Status: AC | PRN
  Administered 2022-08-25: 500 [IU]
  Filled 2022-08-25: qty 5

## 2022-08-25 MED ORDER — SODIUM CHLORIDE 0.9% FLUSH
10.0000 mL | INTRAVENOUS | Status: DC | PRN
  Administered 2022-08-25: 10 mL via INTRAVENOUS
  Filled 2022-08-25: qty 10

## 2022-08-25 MED ORDER — SODIUM CHLORIDE 0.9 % IV SOLN
10.0000 mg | Freq: Once | INTRAVENOUS | Status: AC
  Administered 2022-08-25: 10 mg via INTRAVENOUS
  Filled 2022-08-25: qty 10

## 2022-08-25 MED ORDER — SODIUM CHLORIDE 0.9 % IV SOLN
500.0000 mg/m2 | Freq: Once | INTRAVENOUS | Status: AC
  Administered 2022-08-25: 1000 mg via INTRAVENOUS
  Filled 2022-08-25: qty 40

## 2022-08-25 MED ORDER — SODIUM CHLORIDE 0.9 % IV SOLN
Freq: Once | INTRAVENOUS | Status: AC
  Filled 2022-08-25: qty 250

## 2022-08-25 NOTE — Progress Notes (Signed)
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  She underwent operative fixation on 10/12/2020.  MRI of femur showed heterogeneously enhancing osseous lesion within the area which would be nonspecific versus office neoplasm or metastatic lesion.  CT chest abdomen and pelvis with contrast showed an enlarged pretracheal lymph node 2.2 x 1.5 cm and a right paratracheal lymph node measuring 2.7 x 1.3 cm.  Prevascular node measuring 0.3 x 0.9 cm.  5 x 4 mm right middle lobe nodule.  2.8 x 1.9 cm left adrenal lesion.    Reamings from the right femur showed metastatic poorly differentiated carcinoma.  Immunohistochemistry showed was positive for pancytokeratin, CK7 and patchy CK20 with patchy dim expression of TTF-1.  Cells negative for Melan-A, CDX2, PAX8, Napsin A, GATA3, p40, CD56, p16 and thyroglobulin.  Findings compatible with metastatic carcinoma but because of decalcification immunohistochemical staining is unreliable.  Patchy dim staining with TTF-1 suspicious for lung primary but not a definitive diagnosis.   Repeat supraclavicular lymph node biopsy showed metastatic  adenocarcinoma.  Tumor cells positive for CK7 with focal weak staining for TTF-1.  Suggestive of lung origin in the proper clinical context.  Cells were negative for GATA3 PAX8 CDX2 and CK20 and Napsin A.  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  She is currently on hydrocortisone twice daily.  Thyroid functions presently are normal    Interval history-patient reports that she has had a hoarse voice for the last 1 month.  Denies any cough or shortness of breath.  She still has on and off diarrhea but it is getting more manageable.  She continues to be on Entyvio every 8 weeks.  ECOG PS- 1 Pain scale- 2 Opioid associated constipation- no  Review of systems- Review of Systems  Constitutional:  Positive for malaise/fatigue. Negative for chills, fever and weight loss.  HENT:  Positive for sore throat. Negative for congestion, ear discharge and nosebleeds.   Eyes:  Negative for blurred vision.  Respiratory:  Negative for cough, hemoptysis, sputum production, shortness of breath and wheezing.   Cardiovascular:  Negative for chest pain, palpitations, orthopnea and claudication.  Gastrointestinal:  Positive for diarrhea. Negative for abdominal pain, blood in stool, constipation, heartburn, melena, nausea and vomiting.  Genitourinary:  Negative for dysuria, flank pain, frequency, hematuria and urgency.  Musculoskeletal:  Negative for back pain, joint pain and myalgias.  Skin:  Negative for rash.  Neurological:  Negative for dizziness, tingling, focal weakness, seizures, weakness and headaches.  Endo/Heme/Allergies:  Does not bruise/bleed easily.  Psychiatric/Behavioral:  Negative for depression and suicidal ideas. The patient does not have insomnia.  Allergies  Allergen Reactions   Factive [Gemifloxacin] Rash     Past Medical History:  Diagnosis  Date   Abnormal Pap smear of cervix    Anxiety    Depression    Diverticulosis    Essential hypertension    Femur fracture, right (HCC)    GERD (gastroesophageal reflux disease)    Lung cancer (HCC)    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   Palpitations    Precordial chest pain    Wrist fracture 03/2021   left     Past Surgical History:  Procedure Laterality Date   BREAST BIOPSY Right 2017   benign   CERVICAL BIOPSY  W/ LOOP ELECTRODE EXCISION     COLONOSCOPY  06/11/2022   Procedure: COLONOSCOPY;  Surgeon: Lin Landsman, MD;  Location: [facility] ENDOSCOPY;  Service: Gastroenterology;;   COLONOSCOPY WITH PROPOFOL N/A 07/03/2015   Procedure: COLONOSCOPY WITH PROPOFOL;  Surgeon: Hulen Luster, MD;  Location: [facility] ENDOSCOPY;  Service: Gastroenterology;  Laterality: N/A;   COLONOSCOPY WITH PROPOFOL N/A 01/06/2022   Procedure: COLONOSCOPY WITH PROPOFOL;  Surgeon: Lin Landsman, MD;  Location: [facility] ENDOSCOPY;  Service: Gastroenterology;  Laterality: N/A;   ESOPHAGOGASTRODUODENOSCOPY (EGD) WITH PROPOFOL N/A 07/03/2015   Procedure: ESOPHAGOGASTRODUODENOSCOPY (EGD) WITH PROPOFOL;  Surgeon: Hulen Luster, MD;  Location: [facility] ENDOSCOPY;  Service: Gastroenterology;  Laterality: N/A;   FLEXIBLE SIGMOIDOSCOPY N/A 03/12/2022   Procedure: FLEXIBLE SIGMOIDOSCOPY;  Surgeon: Lin Landsman, MD;  Location: [facility] ENDOSCOPY;  Service: Gastroenterology;  Laterality: N/A;   FLEXIBLE SIGMOIDOSCOPY N/A 08/17/2022   Procedure: FLEXIBLE SIGMOIDOSCOPY;  Surgeon: Lin Landsman, MD;  Location: [facility] ENDOSCOPY;  Service: Gastroenterology;  Laterality: N/A;   FRACTURE SURGERY     INTRAMEDULLARY (IM) NAIL INTERTROCHANTERIC Right 09/15/2020   Procedure: INTRAMEDULLARY (IM) NAIL INTERTROCHANTRIC;  Surgeon: Corky Mull, MD;  Location: [facility] ORS;  Service: Orthopedics;  Laterality: Right;   IR CV LINE INJECTION  07/14/2022   IR IMAGING GUIDED PORT INSERTION  11/28/2020   LEFT HEART CATH AND CORONARY ANGIOGRAPHY  N/A 03/28/2017   Procedure: Left Heart Cath and Coronary Angiography;  Surgeon: Lorretta Harp, MD;  Location: [facility] CV LAB;  Service: Cardiovascular;  Laterality: N/A;   ORIF WRIST FRACTURE Left 03/31/2021   Procedure: OPEN REDUCTION INTERNAL FIXATION (ORIF) LEFT DISTAL RADIUS FRACTURE.;  Surgeon: Corky Mull, MD;  Location: [facility] ORS;  Service: Orthopedics;  Laterality: Left;    Social History   Socioeconomic History   Marital status: Married    Spouse name: Gene   Number of children: 2   Years of education: Not on file   Highest education level: Not on file  Occupational History   Not on file  Tobacco Use   Smoking status: Former    Packs/day: 1.00    Years: 30.00    Total pack years: 30.00    Types: Cigarettes    Quit date: 09/17/2020    Years since quitting: 1.9   Smokeless tobacco: Never  Vaping Use   Vaping Use: Never used  Substance and Sexual Activity   Alcohol use: Not Currently    Alcohol/week: 1.0 standard drink of alcohol    Types: 1 Standard drinks or equivalent per week    Comment: beer occ.   Drug use: No   Sexual activity: Yes    Birth control/protection: Post-menopausal  Other Topics Concern   Not on file  Social History Narrative   Lives in [place] with her husband.  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   Social Determinants of Health   Financial Resource Strain: Not on file  Food Insecurity: Not on file  Transportation Needs: Not on file  Physical Activity: Not on file  Stress: Not on file  Social Connections: Not on file  Intimate Partner Violence: Not on file    Family History  Problem Relation Age of Onset   Diabetes Mother        alive @ 24   Goiter Mother    Heart disease Father        died of MI @ 27   Osteoporosis Maternal Grandmother    Colon cancer Maternal Grandfather    Breast cancer Neg Hx    Ovarian cancer Neg Hx      Current Outpatient Medications:    ALPRAZolam  (XANAX) 0.5 MG tablet, TAKE ONE TABLET DAILY AS NEEDED, Disp: 30 tablet, Rfl: 0   amoxicillin-clavulanate (AUGMENTIN) 875-125 MG tablet, Take 1 tablet by mouth 2 (two) times daily., Disp: , Rfl:    famotidine (PEPCID) 20 MG tablet, Take 20 mg by mouth in the morning., Disp: , Rfl:    folic acid (FOLVITE) 1 MG tablet, Take 1 tablet (1 mg total) by mouth daily. Start 7 days before pemetrexed chemotherapy. Continue until 21 days after pemetrexed completed., Disp: 100 tablet, Rfl: 3   hydrocortisone (CORTEF) 10 MG tablet, TAKE 1 TABLET BY MOUTH NIGHTLY, Disp: 30 tablet, Rfl: 3   hydrocortisone (CORTEF) 20 MG tablet, TAKE 1 TABLET BY MOUTH EVERY MORNING, Disp: 30 tablet, Rfl: 3   lidocaine-prilocaine (EMLA) cream, Apply to affected area once, Disp: 30 g, Rfl: 3   metoprolol tartrate (LOPRESSOR) 25 MG tablet, Take 25 mg by mouth in the morning., Disp: , Rfl:    ondansetron (ZOFRAN) 4 MG tablet, TAKE 1 TABLET BY MOUTH EVERY 8 HOURS AS NEEDED FOR NAUSEA AND VOMITING, Disp: 20 tablet, Rfl: 1   oxyCODONE (OXY IR/ROXICODONE) 5 MG immediate release tablet, TAKE 1-2 TABLETS BY MOUTH EVERY 4 HOURS AS NEEDED FOR MODERATE PAIN, Disp: 60 tablet, Rfl: 0   prochlorperazine (COMPAZINE) 10 MG tablet, Take 1 tablet (10 mg total) by mouth every 6 (six) hours as needed for nausea or vomiting., Disp: 30 tablet, Rfl: 1 No current facility-administered medications for this visit.  Facility-Administered Medications Ordered in Other Visits:    sodium chloride flush (NS) 0.9 % injection 10 mL, 10 mL, Intravenous, PRN, Sindy Guadeloupe, MD, 10 mL at 03/09/21 0906   sodium chloride flush (NS) 0.9 % injection 10 mL, 10 mL, Intravenous, PRN, Sindy Guadeloupe, MD, 10 mL at 08/25/22 0959  Physical exam:  Vitals:   08/25/22 1012  BP: (!) 125/90  Pulse: 85  Temp: (!) 96.9 F (36.1 C)  TempSrc: Tympanic  SpO2: 99%  Weight: 195 lb (88.5 kg)  Height: _0  (1.676 m)   Physical Exam HENT:     Mouth/Throat:     Mouth: Mucous  membranes are moist.     Pharynx: Oropharynx is clear.  Cardiovascular:     Rate and Rhythm: Normal rate and regular rhythm.     Heart sounds: Normal heart sounds.  Pulmonary:     Effort: Pulmonary effort is normal.     Breath sounds: Normal breath sounds.  Skin:    General: Skin is warm and dry.  Neurological:     Mental Status: She is alert and oriented to person, place, and time.  Latest Ref Rng & Units 08/25/2022    9:56 AM  CMP  Glucose 70 - 99 mg/dL 119   BUN 6 - 20 mg/dL 11   Creatinine 0.44 - 1.00 mg/dL 0.73   Sodium 135 - 145 mmol/L 137   Potassium 3.5 - 5.1 mmol/L 3.3   Chloride 98 - 111 mmol/L 104   CO2 22 - 32 mmol/L 24   Calcium 8.9 - 10.3 mg/dL 8.3   Total Protein 6.5 - 8.1 g/dL 6.8   Total Bilirubin 0.3 - 1.2 mg/dL 0.5   Alkaline Phos 38 - 126 U/L 153   AST 15 - 41 U/L 18   ALT 0 - 44 U/L 14       Latest Ref Rng & Units 08/25/2022    9:56 AM  CBC  WBC 4.0 - 10.5 K/uL 9.6   Hemoglobin 12.0 - 15.0 g/dL 11.1   Hematocrit 36.0 - 46.0 % 34.5   Platelets 150 - 400 K/uL 328      Assessment and plan- Patient is a 56 y.o. female with metastatic adenocarcinoma of the lung with bone and adrenal gland metastases.  She is here for on treatment assessment prior to next cycle of Alimta  Counts okay to proceed with cycle 8 of Alimta today.  I will see her back in 3 weeks for cycle 9.  I will plan to get repeat CT chest abdomen pelvis with contrast and bone scan right before her cycle 9 of treatment.  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  Bone mets: She will receive Zometa every 3 months and will be due for her next dose in February 2024.  Neoplasm related pain: Continue as needed oxycodone  Hoarseness of voice: Since symptoms have been lingering for over a month I will refer her to ENT to rule out any vocal cord paralysis   Visit Diagnosis 1. Primary malignant neoplasm of right lung metastatic to  other site [facility])      Dr. Randa Evens, MD, MPH [facility] at [facility] [phone] 08/25/2022 12:27 PM

## 2022-08-26 ENCOUNTER — Other Ambulatory Visit: Payer: Self-pay

## 2022-08-29 ENCOUNTER — Other Ambulatory Visit: Payer: Self-pay

## 2022-08-29 LAB — C DIFFICILE, CYTOTOXIN B

## 2022-08-31 LAB — C DIFFICILE, CYTOTOXIN B

## 2022-08-31 LAB — CLOSTRIDIUM DIFFICILE BY PCR: Toxigenic C. Difficile by PCR: POSITIVE — AB

## 2022-08-31 LAB — C DIFFICILE TOXINS A+B W/RFLX: C difficile Toxins A+B, EIA: NEGATIVE

## 2022-09-01 ENCOUNTER — Telehealth: Payer: Self-pay

## 2022-09-01 NOTE — Telephone Encounter (Signed)
[facility] PA department and they said a PA was required for the change in frequency. They said a standard PA was up to 15 to 21 days. A urgent PA was 48 to 72 hours. They said they could not do the PA over the phone anymore it either had to be done by filling out a form and faxing the PA in or online. Informed her I tried to do it online but it would like let me pick who was doing the infusion. She said they are having so updates in there system so she said it would be best to fax in the form. Fax form in and marked it as urgent.   The pharmacist said he was going send a message to the nursing team about scheduling her infusion for tomorrow

## 2022-09-01 NOTE — Telephone Encounter (Signed)
Patient verbalized understanding of results. Patient is okay with changing the Entyvio to every 4 weeks. Patient states she is supposed to have the infusion tomorrow and has the medication. She states she has not heard from anyone about when they are coming for the infusion though. Called [facility] and talk to Pharmacist named Hurley Cisco and gave him the new order for the infusion every 4 weeks.

## 2022-09-01 NOTE — Telephone Encounter (Signed)
-----   Message from Lin Landsman, MD sent at 08/31/2022  4:34 PM EST ----- Caryl Pina  Please inform patient that the C. difficile came back negative.  The stool test just indicates that she is a carrier from past infection.  Given that the pathology results from recent sigmoidoscopy shows chronic moderately active colitis, I recommended to increase Entyvio to every 4 weeks if patient is agreeable  Rohini Vanga

## 2022-09-02 DIAGNOSIS — K523 Indeterminate colitis: Secondary | ICD-10-CM | POA: Diagnosis not present

## 2022-09-02 DIAGNOSIS — C3491 Malignant neoplasm of unspecified part of right bronchus or lung: Secondary | ICD-10-CM | POA: Diagnosis not present

## 2022-09-03 ENCOUNTER — Other Ambulatory Visit: Payer: Self-pay | Admitting: Oncology

## 2022-09-07 DIAGNOSIS — R490 Dysphonia: Secondary | ICD-10-CM | POA: Diagnosis not present

## 2022-09-07 DIAGNOSIS — J3801 Paralysis of vocal cords and larynx, unilateral: Secondary | ICD-10-CM | POA: Diagnosis not present

## 2022-09-08 ENCOUNTER — Other Ambulatory Visit: Payer: Self-pay | Admitting: *Deleted

## 2022-09-08 DIAGNOSIS — C3491 Malignant neoplasm of unspecified part of right bronchus or lung: Secondary | ICD-10-CM

## 2022-09-08 DIAGNOSIS — J3800 Paralysis of vocal cords and larynx, unspecified: Secondary | ICD-10-CM

## 2022-09-10 ENCOUNTER — Ambulatory Visit: Payer: 59 | Admitting: Oncology

## 2022-09-10 ENCOUNTER — Other Ambulatory Visit: Payer: 59

## 2022-09-10 ENCOUNTER — Ambulatory Visit: Payer: 59

## 2022-09-14 ENCOUNTER — Other Ambulatory Visit: Payer: Self-pay | Admitting: Oncology

## 2022-09-14 ENCOUNTER — Other Ambulatory Visit: Payer: Self-pay

## 2022-09-14 MED FILL — Dexamethasone Sodium Phosphate Inj 100 MG/10ML: INTRAMUSCULAR | Qty: 1 | Status: AC

## 2022-09-15 ENCOUNTER — Inpatient Hospital Stay: Payer: 59

## 2022-09-15 ENCOUNTER — Encounter: Payer: Self-pay | Admitting: Oncology

## 2022-09-15 ENCOUNTER — Telehealth: Payer: Self-pay

## 2022-09-15 ENCOUNTER — Inpatient Hospital Stay: Payer: 59 | Admitting: Oncology

## 2022-09-15 VITALS — BP 142/90 | HR 79 | Temp 96.8°F | Resp 16 | Wt 193.0 lb

## 2022-09-15 DIAGNOSIS — I2510 Atherosclerotic heart disease of native coronary artery without angina pectoris: Secondary | ICD-10-CM | POA: Insufficient documentation

## 2022-09-15 DIAGNOSIS — G893 Neoplasm related pain (acute) (chronic): Secondary | ICD-10-CM | POA: Insufficient documentation

## 2022-09-15 DIAGNOSIS — K5730 Diverticulosis of large intestine without perforation or abscess without bleeding: Secondary | ICD-10-CM | POA: Insufficient documentation

## 2022-09-15 DIAGNOSIS — J3800 Paralysis of vocal cords and larynx, unspecified: Secondary | ICD-10-CM | POA: Insufficient documentation

## 2022-09-15 DIAGNOSIS — I6523 Occlusion and stenosis of bilateral carotid arteries: Secondary | ICD-10-CM | POA: Diagnosis not present

## 2022-09-15 DIAGNOSIS — Z5111 Encounter for antineoplastic chemotherapy: Secondary | ICD-10-CM | POA: Insufficient documentation

## 2022-09-15 DIAGNOSIS — E041 Nontoxic single thyroid nodule: Secondary | ICD-10-CM | POA: Insufficient documentation

## 2022-09-15 DIAGNOSIS — R490 Dysphonia: Secondary | ICD-10-CM | POA: Insufficient documentation

## 2022-09-15 DIAGNOSIS — R590 Localized enlarged lymph nodes: Secondary | ICD-10-CM | POA: Diagnosis not present

## 2022-09-15 DIAGNOSIS — Z87891 Personal history of nicotine dependence: Secondary | ICD-10-CM | POA: Insufficient documentation

## 2022-09-15 DIAGNOSIS — C3491 Malignant neoplasm of unspecified part of right bronchus or lung: Secondary | ICD-10-CM | POA: Diagnosis not present

## 2022-09-15 DIAGNOSIS — C7972 Secondary malignant neoplasm of left adrenal gland: Secondary | ICD-10-CM | POA: Insufficient documentation

## 2022-09-15 DIAGNOSIS — Z79899 Other long term (current) drug therapy: Secondary | ICD-10-CM | POA: Insufficient documentation

## 2022-09-15 DIAGNOSIS — E2740 Unspecified adrenocortical insufficiency: Secondary | ICD-10-CM | POA: Insufficient documentation

## 2022-09-15 DIAGNOSIS — C342 Malignant neoplasm of middle lobe, bronchus or lung: Secondary | ICD-10-CM | POA: Insufficient documentation

## 2022-09-15 DIAGNOSIS — I700 Atherosclerosis of aorta: Secondary | ICD-10-CM | POA: Insufficient documentation

## 2022-09-15 DIAGNOSIS — C778 Secondary and unspecified malignant neoplasm of lymph nodes of multiple regions: Secondary | ICD-10-CM | POA: Insufficient documentation

## 2022-09-15 DIAGNOSIS — C7800 Secondary malignant neoplasm of unspecified lung: Secondary | ICD-10-CM | POA: Insufficient documentation

## 2022-09-15 DIAGNOSIS — I10 Essential (primary) hypertension: Secondary | ICD-10-CM | POA: Insufficient documentation

## 2022-09-15 DIAGNOSIS — C801 Malignant (primary) neoplasm, unspecified: Secondary | ICD-10-CM | POA: Insufficient documentation

## 2022-09-15 DIAGNOSIS — B029 Zoster without complications: Secondary | ICD-10-CM | POA: Insufficient documentation

## 2022-09-15 DIAGNOSIS — C7951 Secondary malignant neoplasm of bone: Secondary | ICD-10-CM | POA: Insufficient documentation

## 2022-09-15 DIAGNOSIS — C7970 Secondary malignant neoplasm of unspecified adrenal gland: Secondary | ICD-10-CM | POA: Diagnosis not present

## 2022-09-15 DIAGNOSIS — Z7952 Long term (current) use of systemic steroids: Secondary | ICD-10-CM | POA: Insufficient documentation

## 2022-09-15 DIAGNOSIS — K219 Gastro-esophageal reflux disease without esophagitis: Secondary | ICD-10-CM | POA: Insufficient documentation

## 2022-09-15 DIAGNOSIS — Z510 Encounter for antineoplastic radiation therapy: Secondary | ICD-10-CM | POA: Insufficient documentation

## 2022-09-15 LAB — CBC WITH DIFFERENTIAL/PLATELET
Abs Immature Granulocytes: 0.13 10*3/uL — ABNORMAL HIGH (ref 0.00–0.07)
Basophils Absolute: 0 10*3/uL (ref 0.0–0.1)
Basophils Relative: 0 %
Eosinophils Absolute: 0 10*3/uL (ref 0.0–0.5)
Eosinophils Relative: 0 %
HCT: 36.9 % (ref 36.0–46.0)
Hemoglobin: 11.8 g/dL — ABNORMAL LOW (ref 12.0–15.0)
Immature Granulocytes: 1 %
Lymphocytes Relative: 13 %
Lymphs Abs: 1.6 10*3/uL (ref 0.7–4.0)
MCH: 26.2 pg (ref 26.0–34.0)
MCHC: 32 g/dL (ref 30.0–36.0)
MCV: 82 fL (ref 80.0–100.0)
Monocytes Absolute: 0.4 10*3/uL (ref 0.1–1.0)
Monocytes Relative: 3 %
Neutro Abs: 10.1 10*3/uL — ABNORMAL HIGH (ref 1.7–7.7)
Neutrophils Relative %: 83 %
Platelets: 444 10*3/uL — ABNORMAL HIGH (ref 150–400)
RBC: 4.5 MIL/uL (ref 3.87–5.11)
RDW: 17.6 % — ABNORMAL HIGH (ref 11.5–15.5)
WBC: 12.3 10*3/uL — ABNORMAL HIGH (ref 4.0–10.5)
nRBC: 0 % (ref 0.0–0.2)

## 2022-09-15 LAB — COMPREHENSIVE METABOLIC PANEL
ALT: 19 U/L (ref 0–44)
AST: 18 U/L (ref 15–41)
Albumin: 3.5 g/dL (ref 3.5–5.0)
Alkaline Phosphatase: 238 U/L — ABNORMAL HIGH (ref 38–126)
Anion gap: 11 (ref 5–15)
BUN: 18 mg/dL (ref 6–20)
CO2: 23 mmol/L (ref 22–32)
Calcium: 8.2 mg/dL — ABNORMAL LOW (ref 8.9–10.3)
Chloride: 101 mmol/L (ref 98–111)
Creatinine, Ser: 0.66 mg/dL (ref 0.44–1.00)
GFR, Estimated: >60 mL/min (ref >60)
Glucose, Bld: 140 mg/dL — ABNORMAL HIGH (ref 70–99)
Potassium: 4 mmol/L (ref 3.5–5.1)
Sodium: 135 mmol/L (ref 135–145)
Total Bilirubin: 0.8 mg/dL (ref 0.3–1.2)
Total Protein: 7.4 g/dL (ref 6.5–8.1)

## 2022-09-15 MED ORDER — SODIUM CHLORIDE 0.9 % IV SOLN
10.0000 mg | Freq: Once | INTRAVENOUS | Status: AC
  Administered 2022-09-15: 10 mg via INTRAVENOUS
  Filled 2022-09-15: qty 10

## 2022-09-15 MED ORDER — HEPARIN SOD (PORK) LOCK FLUSH 100 UNIT/ML IV SOLN
500.0000 [IU] | Freq: Once | INTRAVENOUS | Status: AC | PRN
  Filled 2022-09-15: qty 5

## 2022-09-15 MED ORDER — HEPARIN SOD (PORK) LOCK FLUSH 100 UNIT/ML IV SOLN
INTRAVENOUS | Status: AC
  Administered 2022-09-15: 500 [IU]
  Filled 2022-09-15: qty 5

## 2022-09-15 MED ORDER — SODIUM CHLORIDE 0.9 % IV SOLN
500.0000 mg/m2 | Freq: Once | INTRAVENOUS | Status: AC
  Administered 2022-09-15: 1000 mg via INTRAVENOUS
  Filled 2022-09-15: qty 40

## 2022-09-15 MED ORDER — SODIUM CHLORIDE 0.9 % IV SOLN
Freq: Once | INTRAVENOUS | Status: AC
  Filled 2022-09-15: qty 250

## 2022-09-15 NOTE — Telephone Encounter (Signed)
Called to check status on the PA for the [facility] every 4 weeks. They said they receive fax but for some reason it was not process. She process the PA and marked it as urgent. She states we should have a answer today or tomorrow. PA number is [phone]

## 2022-09-15 NOTE — Telephone Encounter (Signed)
Subittmed PA through cover my meds for patient Banophen 25mg  and Epi pen Auto injector. The epi pen Auto injector states Your PA has been resolved, no additional PA is require. The Banophen has been sent to the plan

## 2022-09-15 NOTE — Patient Instructions (Signed)
[facility] CANCER [facility] AT [place]  Discharge Instructions: Thank you for choosing [facility] to provide your oncology and hematology care.  If you have a lab appointment with the [place], please go directly to the [place] and check in at the registration area.  Wear comfortable clothing and clothing appropriate for easy access to any Portacath or PICC line.   We strive to give you quality time with your provider. You may need to reschedule your appointment if you arrive late (15 or more minutes).  Arriving late affects you and other patients whose appointments are after yours.  Also, if you miss three or more appointments without notifying the office, you may be dismissed from the clinic at the provider's discretion.      For prescription refill requests, have your pharmacy contact our office and allow 72 hours for refills to be completed.    Today you received the following chemotherapy and/or immunotherapy agents Alimta        To help prevent nausea and vomiting after your treatment, we encourage you to take your nausea medication as directed.  BELOW ARE SYMPTOMS THAT SHOULD BE REPORTED IMMEDIATELY: *FEVER GREATER THAN 100.4 F (38 C) OR HIGHER *CHILLS OR SWEATING *NAUSEA AND VOMITING THAT IS NOT CONTROLLED WITH YOUR NAUSEA MEDICATION *UNUSUAL SHORTNESS OF BREATH *UNUSUAL BRUISING OR BLEEDING *URINARY PROBLEMS (pain or burning when urinating, or frequent urination) *BOWEL PROBLEMS (unusual diarrhea, constipation, pain near the anus) TENDERNESS IN MOUTH AND THROAT WITH OR WITHOUT PRESENCE OF ULCERS (sore throat, sores in mouth, or a toothache) UNUSUAL RASH, SWELLING OR PAIN  UNUSUAL VAGINAL DISCHARGE OR ITCHING   Items with * indicate a potential emergency and should be followed up as soon as possible or go to the Emergency Department if any problems should occur.  Please show the CHEMOTHERAPY ALERT CARD or IMMUNOTHERAPY ALERT CARD at check-in to  the Emergency Department and triage nurse.  Should you have questions after your visit or need to cancel or reschedule your appointment, please contact [facility] CANCER [place] AT [place]  [phone] and follow the prompts.  Office hours are 8:00 a.m. to 4:30 p.m. Monday - Friday. Please note that voicemails left after 4:00 p.m. may not be returned until the following business day.  We are closed weekends and major holidays. You have access to a nurse at all times for urgent questions. Please call the main number to the clinic [phone] and follow the prompts.  For any non-urgent questions, you may also contact your provider using MyChart. We now offer e-Visits for anyone 36 and older to request care online for non-urgent symptoms. For details visit [email or web address].   Also download the MyChart app! Go to the app store, search "MyChart", open the app, select [facility], and log in with your MyChart username and password.

## 2022-09-15 NOTE — Telephone Encounter (Signed)
Insurance company denied the [employer] 25mg 

## 2022-09-15 NOTE — Progress Notes (Signed)
Pt in for follow up, reports still having "hoarseness" states has been going on for a month and being followed by ENT.  Pt scheduled for whole body scan and CT scan tomorrow.

## 2022-09-15 NOTE — Telephone Encounter (Signed)
[employer] approved the [facility] for every 4 weeks from 01/03/024 to 10/15/2022

## 2022-09-16 ENCOUNTER — Ambulatory Visit
Admission: RE | Admit: 2022-09-16 | Discharge: 2022-09-16 | Disposition: A | Discharge status: Home / Self Care | Payer: 59 | Source: Ambulatory Visit | Attending: Oncology | Admitting: Oncology

## 2022-09-16 ENCOUNTER — Other Ambulatory Visit: Payer: Self-pay

## 2022-09-16 DIAGNOSIS — C7951 Secondary malignant neoplasm of bone: Secondary | ICD-10-CM | POA: Diagnosis not present

## 2022-09-16 DIAGNOSIS — M47812 Spondylosis without myelopathy or radiculopathy, cervical region: Secondary | ICD-10-CM | POA: Diagnosis not present

## 2022-09-16 DIAGNOSIS — C3491 Malignant neoplasm of unspecified part of right bronchus or lung: Secondary | ICD-10-CM | POA: Diagnosis not present

## 2022-09-16 DIAGNOSIS — Z85118 Personal history of other malignant neoplasm of bronchus and lung: Secondary | ICD-10-CM | POA: Diagnosis not present

## 2022-09-16 DIAGNOSIS — J3800 Paralysis of vocal cords and larynx, unspecified: Secondary | ICD-10-CM | POA: Insufficient documentation

## 2022-09-16 DIAGNOSIS — K5730 Diverticulosis of large intestine without perforation or abscess without bleeding: Secondary | ICD-10-CM | POA: Diagnosis not present

## 2022-09-16 DIAGNOSIS — E041 Nontoxic single thyroid nodule: Secondary | ICD-10-CM | POA: Diagnosis not present

## 2022-09-16 DIAGNOSIS — R918 Other nonspecific abnormal finding of lung field: Secondary | ICD-10-CM | POA: Diagnosis not present

## 2022-09-16 DIAGNOSIS — K7689 Other specified diseases of liver: Secondary | ICD-10-CM | POA: Diagnosis not present

## 2022-09-16 DIAGNOSIS — Z85858 Personal history of malignant neoplasm of other endocrine glands: Secondary | ICD-10-CM | POA: Diagnosis not present

## 2022-09-16 DIAGNOSIS — I700 Atherosclerosis of aorta: Secondary | ICD-10-CM | POA: Diagnosis not present

## 2022-09-16 MED ORDER — TECHNETIUM TC 99M MEDRONATE IV KIT
20.0000 | PACK | Freq: Once | INTRAVENOUS | Status: AC | PRN
  Administered 2022-09-16: 21.54 via INTRAVENOUS

## 2022-09-16 MED ORDER — IOHEXOL 300 MG/ML  SOLN
100.0000 mL | Freq: Once | INTRAMUSCULAR | Status: AC | PRN
  Administered 2022-09-16: 100 mL via INTRAVENOUS

## 2022-09-17 ENCOUNTER — Other Ambulatory Visit: Payer: Self-pay | Admitting: Oncology

## 2022-09-17 ENCOUNTER — Telehealth: Payer: Self-pay | Admitting: Pharmacist

## 2022-09-17 DIAGNOSIS — C3491 Malignant neoplasm of unspecified part of right bronchus or lung: Secondary | ICD-10-CM

## 2022-09-17 NOTE — Telephone Encounter (Addendum)
Oral Oncology Pharmacist Encounter   Received notification from [facility] that prior authorization for Lumakras is required.   PA submitted on CMM Key [id number] Status is pending    Oral Oncology Clinic will continue to follow.   Darl Pikes, PharmD, BCPS, [facility] Hematology/Oncology Clinical Pharmacist [facility] [place] [facility] [phone]  09/17/2022 3:00 PM

## 2022-09-17 NOTE — Telephone Encounter (Signed)
Oral Oncology Pharmacist Encounter  Received new prescription for Lumakras (sotorasib) for the treatment of metastatic lung cancer, planned duration until disease progression or unacceptable drug toxicity.  CMP from 09/15/22 assessed, no relevant lab abnormalities. Prescription dose and frequency assessed.   Monitoring needed: LFTs every 3 weeks for the first 3 months of treatment, then once a month or as clinically indicated  Current medication list in Epic reviewed, several DDIs with sotorasib identified: Famotidine: Histamine H2 Receptor Antagonists, like famotidine, may decrease the serum concentration of sotorasib. Famotidine will need to be stopped Alternative if needed: calcium carbonate, administer sotorasib 4 hours before or 10 hours after oral antacid administration Sotorasib may decrease the serum concentration of alprazolam, hydrocortisone, and oxycodone. Monitor patient for decreased effectiveness of the listed medications. No baseline dose adjustment needed.   Evaluated chart and no patient barriers to medication adherence identified.   Prescription has been e-scribed to the [facility] for benefits analysis and approval.  Oral Oncology Clinic will continue to follow for insurance authorization, copayment issues, initial counseling and start date.   Remi Haggard, PharmD, BCPS, BCOP, CPP Hematology/Oncology Clinical Pharmacist Practitioner [place]/[facility]/AP Oral Chemotherapy Navigation Clinic [phone]  09/17/2022 3:35 PM

## 2022-09-19 ENCOUNTER — Encounter: Payer: Self-pay | Admitting: Oncology

## 2022-09-19 NOTE — Progress Notes (Signed)
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  She underwent operative fixation on 10/12/2020.  MRI of femur showed heterogeneously enhancing osseous lesion within the area which would be nonspecific versus office neoplasm or metastatic lesion.  CT chest abdomen and pelvis with contrast showed an enlarged pretracheal lymph node 2.2 x 1.5 cm and a right paratracheal lymph node measuring 2.7 x 1.3 cm.  Prevascular node measuring 0.3 x 0.9 cm.  5 x 4 mm right middle lobe nodule.  2.8 x 1.9 cm left adrenal lesion.    Reamings from the right femur showed metastatic poorly differentiated carcinoma.  Immunohistochemistry showed was positive for pancytokeratin, CK7 and patchy CK20 with patchy dim expression of TTF-1.  Cells negative for Melan-A, CDX2, PAX8, Napsin A, GATA3, p40, CD56, p16 and thyroglobulin.  Findings compatible with metastatic carcinoma but because of decalcification immunohistochemical staining is unreliable.  Patchy dim staining with TTF-1 suspicious for lung primary but not a definitive diagnosis.   Repeat supraclavicular lymph node biopsy showed metastatic  adenocarcinoma.  Tumor cells positive for CK7 with focal weak staining for TTF-1.  Suggestive of lung origin in the proper clinical context.  Cells were negative for GATA3 PAX8 CDX2 and CK20 and Napsin A.  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  She is currently on hydrocortisone twice daily.  Thyroid functions presently are normal    Interval history-hoarseness of voice continues.  She does report some increased pain in her back over the last few days. Continues to have on and off diarrhea  ECOG PS- 1 Pain scale- 3   Review of systems- Review of Systems  Constitutional:  Positive for malaise/fatigue. Negative for chills, fever and weight loss.  HENT:  Negative for congestion, ear discharge and nosebleeds.   Eyes:  Negative for blurred vision.  Respiratory:  Negative for cough, hemoptysis, sputum production, shortness of breath and wheezing.   Cardiovascular:  Negative for chest pain, palpitations, orthopnea and claudication.  Gastrointestinal:  Negative for abdominal pain, blood in stool, constipation, diarrhea, heartburn, melena, nausea and vomiting.  Genitourinary:  Negative for dysuria, flank pain, frequency, hematuria and urgency.  Musculoskeletal:  Positive for back pain. Negative for joint pain and myalgias.  Skin:  Negative for rash.  Neurological:  Negative for dizziness, tingling, focal weakness, seizures, weakness and headaches.  Endo/Heme/Allergies:  Does not bruise/bleed easily.  Psychiatric/Behavioral:  Negative for depression and suicidal ideas. The patient does not have insomnia.       Allergies  Allergen Reactions   Factive [Gemifloxacin] Rash     Past Medical History:  Diagnosis Date   Abnormal Pap smear of cervix  Anxiety    Depression    Diverticulosis    Essential hypertension    Femur fracture, right (HCC)    GERD  (gastroesophageal reflux disease)    Lung cancer (HCC)    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   Palpitations    Precordial chest pain    Wrist fracture 03/2021   left     Past Surgical History:  Procedure Laterality Date   BREAST BIOPSY Right 2017   benign   CERVICAL BIOPSY  W/ LOOP ELECTRODE EXCISION     COLONOSCOPY  06/11/2022   Procedure: COLONOSCOPY;  Surgeon: Lin Landsman, MD;  Location: [facility] ENDOSCOPY;  Service: Gastroenterology;;   COLONOSCOPY WITH PROPOFOL N/A 07/03/2015   Procedure: COLONOSCOPY WITH PROPOFOL;  Surgeon: Hulen Luster, MD;  Location: [facility] ENDOSCOPY;  Service: Gastroenterology;  Laterality: N/A;   COLONOSCOPY WITH PROPOFOL N/A 01/06/2022   Procedure: COLONOSCOPY WITH PROPOFOL;  Surgeon: Lin Landsman, MD;  Location: [facility] ENDOSCOPY;  Service: Gastroenterology;  Laterality: N/A;   ESOPHAGOGASTRODUODENOSCOPY (EGD) WITH PROPOFOL N/A 07/03/2015   Procedure: ESOPHAGOGASTRODUODENOSCOPY (EGD) WITH PROPOFOL;  Surgeon: Hulen Luster, MD;  Location: [facility] ENDOSCOPY;  Service: Gastroenterology;  Laterality: N/A;   FLEXIBLE SIGMOIDOSCOPY N/A 03/12/2022   Procedure: FLEXIBLE SIGMOIDOSCOPY;  Surgeon: Lin Landsman, MD;  Location: [facility] ENDOSCOPY;  Service: Gastroenterology;  Laterality: N/A;   FLEXIBLE SIGMOIDOSCOPY N/A 08/17/2022   Procedure: FLEXIBLE SIGMOIDOSCOPY;  Surgeon: Lin Landsman, MD;  Location: [facility] ENDOSCOPY;  Service: Gastroenterology;  Laterality: N/A;   FRACTURE SURGERY     INTRAMEDULLARY (IM) NAIL INTERTROCHANTERIC Right 09/15/2020   Procedure: INTRAMEDULLARY (IM) NAIL INTERTROCHANTRIC;  Surgeon: Corky Mull, MD;  Location: [facility] ORS;  Service: Orthopedics;  Laterality: Right;   IR CV LINE INJECTION  07/14/2022   IR IMAGING GUIDED PORT INSERTION  11/28/2020   LEFT HEART CATH AND CORONARY ANGIOGRAPHY N/A 03/28/2017   Procedure: Left Heart Cath and Coronary Angiography;  Surgeon: Lorretta Harp, MD;  Location: [place] CV LAB;  Service:  Cardiovascular;  Laterality: N/A;   ORIF WRIST FRACTURE Left 03/31/2021   Procedure: OPEN REDUCTION INTERNAL FIXATION (ORIF) LEFT DISTAL RADIUS FRACTURE.;  Surgeon: Corky Mull, MD;  Location: [facility] ORS;  Service: Orthopedics;  Laterality: Left;    Social History   Socioeconomic History   Marital status: Married    Spouse name: Gene   Number of children: 2   Years of education: Not on file   Highest education level: Not on file  Occupational History   Not on file  Tobacco Use   Smoking status: Former    Packs/day: 1.00    Years: 30.00    Total pack years: 30.00    Types: Cigarettes    Quit date: 09/17/2020    Years since quitting: 2.0   Smokeless tobacco: Never  Vaping Use   Vaping Use: Never used  Substance and Sexual Activity   Alcohol use: Not Currently    Alcohol/week: 1.0 standard drink of alcohol    Types: 1 Standard drinks or equivalent per week    Comment: beer occ.   Drug use: No   Sexual activity: Yes    Birth control/protection: Post-menopausal  Other Topics Concern   Not on file  Social History Narrative   Lives in [place] with her husband.  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   Social Determinants of Health   Financial Resource Strain: Not on file  Food Insecurity: Not on  file  Transportation Needs: Not on file  Physical Activity: Not on file  Stress: Not on file  Social Connections: Not on file  Intimate Partner Violence: Not on file    Family History  Problem Relation Age of Onset   Diabetes Mother        alive @ 38   Goiter Mother    Heart disease Father        died of MI @ 40   Osteoporosis Maternal Grandmother    Colon cancer Maternal Grandfather    Breast cancer Neg Hx    Ovarian cancer Neg Hx      Current Outpatient Medications:    ALPRAZolam (XANAX) 0.5 MG tablet, TAKE ONE TABLET DAILY AS NEEDED, Disp: 30 tablet, Rfl: 0   famotidine (PEPCID) 20 MG tablet, Take 20 mg by mouth in the  morning., Disp: , Rfl:    metoprolol tartrate (LOPRESSOR) 25 MG tablet, Take 25 mg by mouth in the morning., Disp: , Rfl:    ondansetron (ZOFRAN) 4 MG tablet, TAKE 1 TABLET BY MOUTH EVERY 8 HOURS AS NEEDED FOR NAUSEA AND VOMITING, Disp: 20 tablet, Rfl: 1   oxyCODONE (OXY IR/ROXICODONE) 5 MG immediate release tablet, TAKE 1-2 TABLETS BY MOUTH EVERY 4 HOURS AS NEEDED FOR MODERATE PAIN, Disp: 60 tablet, Rfl: 0   predniSONE (DELTASONE) 10 MG tablet, Take 40 mg by mouth daily. Pt has been tapering and will finish on 09/19/22, Disp: , Rfl:    citalopram (CELEXA) 10 MG tablet, Take 10 mg by mouth daily. (Patient not taking: Reported on 09/15/2022), Disp: , Rfl:    hydrocortisone (CORTEF) 10 MG tablet, TAKE 1 TABLET BY MOUTH NIGHTLY (Patient not taking: Reported on 09/15/2022), Disp: 30 tablet, Rfl: 3   hydrocortisone (CORTEF) 20 MG tablet, TAKE 1 TABLET BY MOUTH EVERY MORNING (Patient not taking: Reported on 09/15/2022), Disp: 30 tablet, Rfl: 3 No current facility-administered medications for this visit.  Facility-Administered Medications Ordered in Other Visits:    sodium chloride flush (NS) 0.9 % injection 10 mL, 10 mL, Intravenous, PRN, Sindy Guadeloupe, MD, 10 mL at 03/09/21 0906  Physical exam:  Vitals:   09/15/22 1335  BP: (!) 142/90  Pulse: 79  Resp: 16  Temp: (!) 96.8 F (36 C)  TempSrc: Tympanic  SpO2: 100%  Weight: 193 lb (87.5 kg)   Physical Exam Cardiovascular:     Rate and Rhythm: Normal rate and regular rhythm.     Heart sounds: Normal heart sounds.  Pulmonary:     Effort: Pulmonary effort is normal.     Breath sounds: Normal breath sounds.  Abdominal:     General: Bowel sounds are normal.     Palpations: Abdomen is soft.  Skin:    General: Skin is warm and dry.  Neurological:     Mental Status: She is alert and oriented to person, place, and time.         Latest Ref Rng & Units 09/15/2022    1:05 PM  CMP  Glucose 70 - 99 mg/dL 140   BUN 6 - 20 mg/dL 18   Creatinine 0.44 -  1.00 mg/dL 0.66   Sodium 135 - 145 mmol/L 135   Potassium 3.5 - 5.1 mmol/L 4.0   Chloride 98 - 111 mmol/L 101   CO2 22 - 32 mmol/L 23   Calcium 8.9 - 10.3 mg/dL 8.2   Total Protein 6.5 - 8.1 g/dL 7.4   Total Bilirubin 0.3 - 1.2 mg/dL 0.8   Alkaline  Phos 38 - 126 U/L 238   AST 15 - 41 U/L 18   ALT 0 - 44 U/L 19       Latest Ref Rng & Units 09/15/2022    1:05 PM  CBC  WBC 4.0 - 10.5 K/uL 12.3   Hemoglobin 12.0 - 15.0 g/dL 11.8   Hematocrit 36.0 - 46.0 % 36.9   Platelets 150 - 400 K/uL 444     No images are attached to the encounter.  CT SOFT TISSUE NECK W CONTRAST  Result Date: 09/18/2022 CLINICAL DATA:  Provided history: Primary malignant neoplasm of right lung metastatic to other site. Vocal cord paralysis. Right lung cancer with metastases and new vocal cord paralysis EXAM: CT NECK WITH CONTRAST TECHNIQUE: Multidetector CT imaging of the neck was performed using the standard protocol following the bolus administration of intravenous contrast. RADIATION DOSE REDUCTION: This exam was performed according to the departmental dose-optimization program which includes automated exposure control, adjustment of the mA and/or kV according to patient size and/or use of iterative reconstruction technique. CONTRAST:  170mL OMNIPAQUE IOHEXOL 300 MG/ML  SOLN COMPARISON:  Same day CT chest/abdomen/pelvis 09/16/2022. Same day head CT 09/16/2022. Same day whole body nuclear medicine bone scan 09/16/2022. FINDINGS: Pharynx and larynx: Punctate calcific foci within the bilateral palatine tonsils, which may reflect postinflammatory calcifications and/or tonsilloliths. Asymmetric prominence of the right pyriform sinus and right laryngeal ventricle, consistent with the provided history of vocal cord paralysis. Salivary glands: No inflammation, mass, or stone. Thyroid: 12 mm nodule within the right thyroid lobe, not meeting consensus criteria for ultrasound follow-up based on size. No follow-up imaging is  recommended. Reference: J Am Coll Radiol. 2015 Feb;12(2): 143-50. Lymph nodes: No pathologically enlarged cervical chain lymph nodes are identified. Vascular: Partially imaged right chest infusion port catheter. The major vascular structures of the neck are patent. Atherosclerotic plaque about both carotid bifurcations. Limited intracranial: No evidence of acute intracranial abnormality within the field of view. Visualized orbits: No orbital mass or acute orbital finding. Mastoids and visualized paranasal sinuses: Mucous retention cyst within a posterior left ethmoid air cell. Mild mucosal thickening scattered elsewhere within the bilateral ethmoid sinuses. Small mucous retention cyst, and minimal background mucosal thickening, within the right maxillary sinus. Minimal mucosal thickening within the left maxillary sinus. Skeleton: Multifocal osseous metastatic disease, better delineated on same day whole-body nuclear medicine bone scan. Cervical spondylosis. Upper chest: Separately reported on same day CT chest/abdomen/pelvis. IMPRESSION: 1. Asymmetric prominence of the right pyriform sinus and right laryngeal ventricle, consistent with the provided history of vocal cord paralysis. 2. No mass or pathologically enlarged lymph nodes identified within the neck. 3. Mild paranasal sinus disease, as described. 4. Same day head CT head, CT chest/abdomen/pelvis and whole-body nuclear medicine bone scan separately reported. Electronically Signed   By: Kellie Simmering D.O.   On: 09/18/2022 13:04   CT CHEST ABDOMEN PELVIS W CONTRAST  Result Date: 09/17/2022 CLINICAL DATA:  Metastatic right lung cancer restaging * Tracking Code: BO * EXAM: CT CHEST, ABDOMEN, AND PELVIS WITH CONTRAST TECHNIQUE: Multidetector CT imaging of the chest, abdomen and pelvis was performed following the standard protocol during bolus administration of intravenous contrast. RADIATION DOSE REDUCTION: This exam was performed according to the departmental  dose-optimization program which includes automated exposure control, adjustment of the mA and/or kV according to patient size and/or use of iterative reconstruction technique. CONTRAST:  152mL OMNIPAQUE IOHEXOL 300 MG/ML  SOLN COMPARISON:  06/03/2022 FINDINGS: CT CHEST FINDINGS Cardiovascular: Right chest port  catheter. Normal heart size. Coronary artery calcifications. No pericardial effusion. Mediastinum/Nodes: Similar matted appearing, enlarged high pretracheal and right superior mediastinal lymph nodes, largest measuring up to 2.5 x 2.0 cm. These appear to at least partially occlude the superior vena cava, which is effaced, with vascular collateralization about the mediastinum (series 3, image 17). Thyroid gland, trachea, and esophagus demonstrate no significant findings. Lungs/Pleura: Multiple new and enlarged bilateral pulmonary nodules, a previously seen nodule of the high superior segment left lower lobe measuring 0.6 cm, previously 0.3 cm (series 5, image 18). New nodule of the slightly inferior superior segment left lower lobe measuring 0.7 x 0.6 cm (series 5, image 24). New nodule of the peripheral right upper lobe measuring 0.6 cm (series 5, image 31). New nodule of the superior segment right lower lobe measuring 0.4 cm (series 5, image 34). No pleural effusion or pneumothorax. Musculoskeletal: No chest wall abnormality. No acute osseous findings. CT ABDOMEN PELVIS FINDINGS Hepatobiliary: No solid liver abnormality is seen. Unchanged fluid attenuation cyst of the anterior liver dome, benign, for which no specific further follow-up or characterization is required. No gallstones, gallbladder wall thickening, or biliary dilatation. Pancreas: Unremarkable. No pancreatic ductal dilatation or surrounding inflammatory changes. Spleen: Normal in size without significant abnormality. Adrenals/Urinary Tract: New left adrenal nodule measuring 1.9 x 1.7 cm (series 3, image 63). New thickening of the right adrenal  body (series 3, image 62). Kidneys are normal, without renal calculi, solid lesion, or hydronephrosis. Bladder is unremarkable. Stomach/Bowel: Stomach is within normal limits. Appendix appears normal. No evidence of bowel wall thickening, distention, or inflammatory changes. Descending and sigmoid diverticulosis. Vascular/Lymphatic: Aortic atherosclerosis. No enlarged abdominal or pelvic lymph nodes. Reproductive: No mass or other abnormality. Other: No abdominal wall hernia or abnormality. No ascites. Numerous new peritoneal and omental soft tissue nodules, for example in the ventral abdomen measuring 1.2 x 1.0 cm (series 3, image 82), and in the left paracolic gutter measuring 1.0 x 1.0 cm (series 3, image 83). Additional retroperitoneal and subcutaneous fat soft tissue nodules, for example a small soft tissue nodule of the right buttock measuring 0.4 cm (series 3, image 95). Musculoskeletal: No acute osseous findings. Status post intramedullary nail fixation of the right femur about an expansile, mixed lytic and sclerotic lesion, partially imaged (series 3, image 139). Unchanged irregular sclerotic lesion of the left iliac wing (series 3, image 105). New sclerotic lesion of the sternal body (series 7, image 102). IMPRESSION: 1. Multiple new and enlarged bilateral pulmonary nodules. 2. Similar matted appearing, enlarged high pretracheal and right superior mediastinal lymph nodes. These appear to at least partially occlude the superior vena cava, which is effaced, with vascular collateralization about the mediastinum. 3. New left adrenal nodule. New thickening of the right adrenal body. 4. Numerous new peritoneal and omental soft tissue nodules, as well as retroperitoneal and subcutaneous fat soft tissue nodules. 5. New sclerotic lesion of the sternal body. Unchanged, partially imaged expansile lesion of the right femoral diaphysis and sclerotic lesion of the left iliac wing. 6. Findings are consistent with new and  worsened metastatic disease. 7. Coronary artery disease. Aortic Atherosclerosis (ICD10-I70.0). Electronically Signed   By: Delanna Ahmadi M.D.   On: 09/17/2022 14:09   NM Bone Scan Whole Body  Result Date: 09/16/2022 CLINICAL DATA:  RIGHT lung cancer, restaging, history of LEFT adrenal and RIGHT femoral metastases EXAM: NUCLEAR MEDICINE WHOLE BODY BONE SCAN TECHNIQUE: Whole body anterior and posterior images were obtained approximately 3 hours after intravenous injection of radiopharmaceutical.  RADIOPHARMACEUTICALS:  21.54 mCi Technetium-54m MDP IV COMPARISON:  07/13/2022 Radiographic correlation: CT head chest abdomen pelvis 09/16/2022 FINDINGS: Multiple foci of abnormal osseous tracer accumulation are seen consistent with osseous metastatic disease. These include calvarium, thoracolumbar spine, pelvis, scapula, BILATERAL ribs, sternum, proximal RIGHT humerus, BILATERAL femora. Metastatic lesions have increased in size and number since previous study. IMPRESSION: Diffuse osseous metastatic disease progressive since 07/13/2022 Electronically Signed   By: Lavonia Dana M.D.   On: 09/16/2022 17:44   CT HEAD WO CONTRAST (5MM)  Result Date: 09/16/2022 CLINICAL DATA:  Metastatic right lung carcinoma, new vocal cord paralysis EXAM: CT HEAD WITHOUT CONTRAST TECHNIQUE: Contiguous axial images were obtained from the base of the skull through the vertex without intravenous contrast. RADIATION DOSE REDUCTION: This exam was performed according to the departmental dose-optimization program which includes automated exposure control, adjustment of the mA and/or kV according to patient size and/or use of iterative reconstruction technique. COMPARISON:  Previous MR brain done on 01/20/2021 FINDINGS: Brain: No acute intracranial findings are seen. There are no signs of bleeding within the cranium. Ventricles are not dilated. There is no focal edema or mass effect. Vascular: Unremarkable. Skull: Unremarkable. In the previous MRI,  there was an enhancing lesion in the right posterior parietal calvarium which could not be distinctly visualized in the noncontrast CT brain. Sinuses/Orbits: There is mucosal thickening in the ethmoid sinus. Other: None. IMPRESSION: No acute intracranial findings are seen in noncontrast CT brain. Chronic ethmoid sinusitis. Electronically Signed   By: Elmer Picker M.D.   On: 09/16/2022 15:26     Assessment and plan- Patient is a 57 y.o. female with metastatic adenocarcinoma of the lung with bone and adrenal gland metastases.   On treatment assessment prior to next cycle of Alimta  Counts okay to proceed with cycle 9 of Alimta today.  She has scans coming up in 2 days.  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  Also with increasing pain and concerned about disease progression.  Based on what the scan show I will plan to switch her to second line treatment with sotorasib given that she was known to have K-ras G12 C mutation on peripheral blood foundation 1 testing.  I will see her back in 1 week to discuss the results of scans in detail.  Neoplasm related pain: Continue as needed oxycodone.  If her pain worsens we will consider adding long-acting pain medicines.  Bone metastases: Calcium is 8.2 today and therefore I am unable to give her Zometa   Visit Diagnosis 1. Primary malignant neoplasm of right lung metastatic to other site [facility])   2. Encounter for antineoplastic chemotherapy   3. Neoplasm related pain      Dr. Randa Evens, MD, MPH [facility] [place] [facility] [phone] 09/19/2022 2:33 PM

## 2022-09-20 ENCOUNTER — Other Ambulatory Visit (HOSPITAL_COMMUNITY): Payer: Self-pay

## 2022-09-20 ENCOUNTER — Ambulatory Visit
Admission: RE | Admit: 2022-09-20 | Discharge: 2022-09-20 | Disposition: A | Discharge status: Home / Self Care | Payer: 59 | Source: Ambulatory Visit | Attending: Radiation Oncology | Admitting: Radiation Oncology

## 2022-09-20 ENCOUNTER — Inpatient Hospital Stay: Payer: 59 | Admitting: Hospice and Palliative Medicine

## 2022-09-20 ENCOUNTER — Other Ambulatory Visit: Payer: Self-pay

## 2022-09-20 ENCOUNTER — Encounter: Payer: Self-pay | Admitting: Oncology

## 2022-09-20 ENCOUNTER — Encounter: Payer: Self-pay | Admitting: Hospice and Palliative Medicine

## 2022-09-20 ENCOUNTER — Inpatient Hospital Stay (HOSPITAL_BASED_OUTPATIENT_CLINIC_OR_DEPARTMENT_OTHER): Payer: 59 | Admitting: Hospice and Palliative Medicine

## 2022-09-20 ENCOUNTER — Telehealth: Payer: Self-pay

## 2022-09-20 ENCOUNTER — Institutional Professional Consult (permissible substitution): Payer: 59 | Admitting: Radiation Oncology

## 2022-09-20 VITALS — BP 116/84 | HR 88 | Temp 96.1°F | Ht 66.0 in | Wt 189.0 lb

## 2022-09-20 DIAGNOSIS — C7800 Secondary malignant neoplasm of unspecified lung: Secondary | ICD-10-CM | POA: Insufficient documentation

## 2022-09-20 DIAGNOSIS — C3491 Malignant neoplasm of unspecified part of right bronchus or lung: Secondary | ICD-10-CM

## 2022-09-20 DIAGNOSIS — C3480 Malignant neoplasm of overlapping sites of unspecified bronchus and lung: Secondary | ICD-10-CM

## 2022-09-20 DIAGNOSIS — C3490 Malignant neoplasm of unspecified part of unspecified bronchus or lung: Secondary | ICD-10-CM

## 2022-09-20 DIAGNOSIS — C7951 Secondary malignant neoplasm of bone: Secondary | ICD-10-CM | POA: Insufficient documentation

## 2022-09-20 DIAGNOSIS — Z510 Encounter for antineoplastic radiation therapy: Secondary | ICD-10-CM | POA: Diagnosis not present

## 2022-09-20 DIAGNOSIS — G893 Neoplasm related pain (acute) (chronic): Secondary | ICD-10-CM

## 2022-09-20 DIAGNOSIS — B029 Zoster without complications: Secondary | ICD-10-CM

## 2022-09-20 DIAGNOSIS — C7970 Secondary malignant neoplasm of unspecified adrenal gland: Secondary | ICD-10-CM | POA: Insufficient documentation

## 2022-09-20 DIAGNOSIS — C801 Malignant (primary) neoplasm, unspecified: Secondary | ICD-10-CM | POA: Insufficient documentation

## 2022-09-20 MED ORDER — OXYCODONE HCL ER 10 MG PO T12A: 10.0000 mg | EXTENDED_RELEASE_TABLET | Freq: Two times a day (BID) | ORAL | 0 refills | Status: DC

## 2022-09-20 MED ORDER — SOTORASIB 320 MG PO TABS: 960.0000 mg | ORAL_TABLET | Freq: Every day | ORAL | 1 refills | Status: DC

## 2022-09-20 MED ORDER — VALACYCLOVIR HCL 1 G PO TABS: 1000.0000 mg | ORAL_TABLET | Freq: Three times a day (TID) | ORAL | 0 refills | Status: DC

## 2022-09-20 NOTE — Telephone Encounter (Signed)
Oral Chemotherapy Pharmacist Encounter   Gina Sanchez was denied in error by the insurance. Called and spoke with a PA rep @ 9:08 on 09/20/22 who stated she saw the mistake that was made on their end and would be updated the PA to approved.    Darl Pikes, PharmD, BCPS, BCOP, CPP Hematology/Oncology Clinical Pharmacist [facility]/AP [facility] [phone]  09/20/2022 10:20 AM

## 2022-09-20 NOTE — Progress Notes (Signed)
Symptom Management [place] [facility] [phone] [phone]  Patient Care Team: Remi Haggard, FNP as PCP - General (Family Medicine) Telford Nab, RN as Oncology Nurse Navigator Noreene Filbert, MD as Radiation Oncologist (Radiation Oncology) Ottie Glazier, MD as Consulting Physician (Pulmonary Disease)   NAME OF PATIENT: Gina Sanchez  [id number]  08/08/1966   DATE OF VISIT: 09/20/22  REASON FOR CONSULT: Gina Sanchez is a 57 y.o. 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    INTERVAL HISTORY: Patient last saw Dr. Janese Banks on 09/15/2022.  Patient completed 9 cycles of Alimta but with concern for increasing pain and possible disease progression, she was switched to second line sotorasib.  CT of the chest, abdomen, and pelvis on 09/16/2022 revealed multiple new and large bilateral pulmonary nodules, enlarged pretracheal and right superior mediastinal lymph nodes with partial occlusion to the SVC with vascular collateralization, new left adrenal nodule, new thickening of the right adrenal body, numerous new peritoneal and omental soft tissue nodules, new sclerotic lesion of the sternal body.  Patient presents [facility] today for discussion and evaluation of pain management.  She endorses persistent low/mid back pain for the past several weeks/months.  She has good days and bad but is overall required more liberalized dosing of oxycodone to achieve adequate pain control.  She denies any adverse effects from pain medications.  No fever or chills.  No urinary symptoms.  Of note, patient also endorses 24 hours of her rash on her left breast.  Denies any neurologic complaints. Denies recent fevers or illnesses. Denies any easy bleeding or bruising. Reports good appetite and denies weight loss. Denies chest pain. Denies any nausea, vomiting, constipation,  or diarrhea. Denies urinary complaints. Patient offers no further specific complaints today.   PAST MEDICAL HISTORY: 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   Palpitations    Precordial chest pain    Wrist fracture 03/2021   left    PAST SURGICAL HISTORY:  Past Surgical History:  Procedure Laterality Date   BREAST BIOPSY Right 2017   benign   CERVICAL BIOPSY  W/ LOOP ELECTRODE EXCISION     COLONOSCOPY  06/11/2022   Procedure: COLONOSCOPY;  Surgeon: Lin Landsman, MD;  Location: [facility] ENDOSCOPY;  Service: Gastroenterology;;   COLONOSCOPY WITH PROPOFOL N/A 07/03/2015   Procedure: COLONOSCOPY WITH PROPOFOL;  Surgeon: Hulen Luster, MD;  Location: [facility] ENDOSCOPY;  Service: Gastroenterology;  Laterality: N/A;   COLONOSCOPY WITH PROPOFOL N/A 01/06/2022   Procedure: COLONOSCOPY WITH PROPOFOL;  Surgeon: Lin Landsman, MD;  Location: [facility] ENDOSCOPY;  Service: Gastroenterology;  Laterality: N/A;   ESOPHAGOGASTRODUODENOSCOPY (EGD) WITH PROPOFOL N/A 07/03/2015   Procedure: ESOPHAGOGASTRODUODENOSCOPY (EGD) WITH PROPOFOL;  Surgeon: Hulen Luster, MD;  Location: [facility] ENDOSCOPY;  Service: Gastroenterology;  Laterality: N/A;   FLEXIBLE SIGMOIDOSCOPY N/A 03/12/2022   Procedure: FLEXIBLE SIGMOIDOSCOPY;  Surgeon: Lin Landsman, MD;  Location: [facility] ENDOSCOPY;  Service: Gastroenterology;  Laterality: N/A;   FLEXIBLE SIGMOIDOSCOPY N/A 08/17/2022   Procedure: FLEXIBLE SIGMOIDOSCOPY;  Surgeon: Lin Landsman, MD;  Location: [facility] ENDOSCOPY;  Service: Gastroenterology;  Laterality: N/A;  FRACTURE SURGERY     INTRAMEDULLARY (IM) NAIL INTERTROCHANTERIC Right 09/15/2020   Procedure: INTRAMEDULLARY (IM) NAIL INTERTROCHANTRIC;  Surgeon: Corky Mull, MD;  Location:  [facility] ORS;  Service: Orthopedics;  Laterality: Right;   IR CV LINE INJECTION  07/14/2022   IR IMAGING GUIDED PORT INSERTION  11/28/2020   LEFT HEART CATH AND CORONARY ANGIOGRAPHY N/A 03/28/2017   Procedure: Left Heart Cath and Coronary Angiography;  Surgeon: Lorretta Harp, MD;  Location: [facility];  Service: Cardiovascular;  Laterality: N/A;   ORIF WRIST FRACTURE Left 03/31/2021   Procedure: OPEN REDUCTION INTERNAL FIXATION (ORIF) LEFT DISTAL RADIUS FRACTURE.;  Surgeon: Corky Mull, MD;  Location: [facility] ORS;  Service: Orthopedics;  Laterality: Left;    HEMATOLOGY/ONCOLOGY HISTORY:  Oncology History  Metastatic lung cancer (metastasis from lung to other site) [facility])  10/19/2020 Initial Diagnosis   Metastatic lung cancer (metastasis from lung to other site) [facility])   10/19/2020 Cancer Staging   Staging form: Lung, AJCC 8th Edition - Clinical: Stage IV (cT1, cN3, pM1) - Signed by Sindy Guadeloupe, MD on 10/19/2020   10/27/2020 - 02/12/2022 Chemotherapy   Patient is on Treatment Plan : LUNG NSCLC Pemetrexed (Alimta) / Carboplatin q21d x 1 cycles     06/08/2022 - 09/15/2022 Chemotherapy   Patient is on Treatment Plan : LUNG NSCLC Pemetrexed + Carboplatin q21d x 4 Cycles       ALLERGIES:  is allergic to factive [gemifloxacin].  MEDICATIONS:  Current Outpatient Medications  Medication Sig Dispense Refill   acetaminophen (TYLENOL) 500 MG tablet Take 500 mg by mouth every 6 (six) hours as needed.     ALPRAZolam (XANAX) 0.5 MG tablet TAKE ONE TABLET DAILY AS NEEDED 30 tablet 0   metoprolol tartrate (LOPRESSOR) 25 MG tablet Take 25 mg by mouth in the morning.     ondansetron (ZOFRAN) 4 MG tablet TAKE 1 TABLET BY MOUTH EVERY 8 HOURS AS NEEDED FOR NAUSEA AND VOMITING 20 tablet 1   oxyCODONE (OXY IR/ROXICODONE) 5 MG immediate release tablet TAKE 1-2 TABLETS BY MOUTH EVERY 4 HOURS AS NEEDED FOR MODERATE PAIN 60 tablet 0   predniSONE (DELTASONE) 10 MG tablet Take 40 mg by mouth daily. Pt has been  tapering and will finish on 09/19/22     citalopram (CELEXA) 10 MG tablet Take 10 mg by mouth daily. (Patient not taking: Reported on 09/15/2022)     hydrocortisone (CORTEF) 10 MG tablet TAKE 1 TABLET BY MOUTH NIGHTLY (Patient not taking: Reported on 09/15/2022) 30 tablet 3   hydrocortisone (CORTEF) 20 MG tablet TAKE 1 TABLET BY MOUTH EVERY MORNING (Patient not taking: Reported on 09/15/2022) 30 tablet 3   sotorasib (LUMAKRAS) 320 MG tablet Take 3 tablets (960 mg total) by mouth daily. (Patient not taking: Reported on 09/20/2022) 90 tablet 1   No current facility-administered medications for this visit.   Facility-Administered Medications Ordered in Other Visits  Medication Dose Route Frequency Provider Last Rate Last Admin   sodium chloride flush (NS) 0.9 % injection 10 mL  10 mL Intravenous PRN Sindy Guadeloupe, MD   10 mL at 03/09/21 0906    VITAL SIGNS: BP 116/84   Pulse 88   Temp (!) 96.1 F (35.6 C) (Tympanic)   Ht 5\' 6"  (1.676 m)   Wt 189 lb (85.7 kg)   LMP 09/15/2018   BMI 30.51 kg/m  Filed Weights   09/20/22 1313  Weight: 189 lb (85.7 kg)    Estimated body mass index is  30.51 kg/m as calculated from the following:   Height as of this encounter: 5\' 6"  (1.676 m).   Weight as of this encounter: 189 lb (85.7 kg).  LABS: CBC:    Component Value Date/Time   WBC 12.3 (H) 09/15/2022 1305   HGB 11.8 (L) 09/15/2022 1305   HGB 13.0 05/31/2022 1156   HCT 36.9 09/15/2022 1305   HCT 40.9 05/31/2022 1156   PLT 444 (H) 09/15/2022 1305   PLT 331 05/31/2022 1156   MCV 82.0 09/15/2022 1305   MCV 87 05/31/2022 1156   NEUTROABS 10.1 (H) 09/15/2022 1305   NEUTROABS 6.5 03/14/2018 1343   LYMPHSABS 1.6 09/15/2022 1305   LYMPHSABS 4.1 (H) 03/14/2018 1343   MONOABS 0.4 09/15/2022 1305   EOSABS 0.0 09/15/2022 1305   EOSABS 0.4 03/14/2018 1343   BASOSABS 0.0 09/15/2022 1305   BASOSABS 0.0 03/14/2018 1343   Comprehensive Metabolic Panel:    Component Value Date/Time   NA 135 09/15/2022 1305    NA 141 05/31/2022 1156   K 4.0 09/15/2022 1305   CL 101 09/15/2022 1305   CO2 23 09/15/2022 1305   BUN 18 09/15/2022 1305   BUN 8 05/31/2022 1156   CREATININE 0.66 09/15/2022 1305   GLUCOSE 140 (H) 09/15/2022 1305   CALCIUM 8.2 (L) 09/15/2022 1305   AST 18 09/15/2022 1305   ALT 19 09/15/2022 1305   ALKPHOS 238 (H) 09/15/2022 1305   BILITOT 0.8 09/15/2022 1305   BILITOT 0.5 05/31/2022 1156   PROT 7.4 09/15/2022 1305   PROT 5.9 (L) 05/31/2022 1156   ALBUMIN 3.5 09/15/2022 1305   ALBUMIN 3.4 (L) 05/31/2022 1156    RADIOGRAPHIC STUDIES: CT SOFT TISSUE NECK W CONTRAST  Result Date: 09/18/2022 CLINICAL DATA:  Provided history: Primary malignant neoplasm of right lung metastatic to other site. Vocal cord paralysis. Right lung cancer with metastases and new vocal cord paralysis EXAM: CT NECK WITH CONTRAST TECHNIQUE: Multidetector CT imaging of the neck was performed using the standard protocol following the bolus administration of intravenous contrast. RADIATION DOSE REDUCTION: This exam was performed according to the departmental dose-optimization program which includes automated exposure control, adjustment of the mA and/or kV according to patient size and/or use of iterative reconstruction technique. CONTRAST:  149mL OMNIPAQUE IOHEXOL 300 MG/ML  SOLN COMPARISON:  Same day CT chest/abdomen/pelvis 09/16/2022. Same day head CT 09/16/2022. Same day whole body nuclear medicine bone scan 09/16/2022. FINDINGS: Pharynx and larynx: Punctate calcific foci within the bilateral palatine tonsils, which may reflect postinflammatory calcifications and/or tonsilloliths. Asymmetric prominence of the right pyriform sinus and right laryngeal ventricle, consistent with the provided history of vocal cord paralysis. Salivary glands: No inflammation, mass, or stone. Thyroid: 12 mm nodule within the right thyroid lobe, not meeting consensus criteria for ultrasound follow-up based on size. No follow-up imaging is  recommended. Reference: J Am Coll Radiol. 2015 Feb;12(2): 143-50. Lymph nodes: No pathologically enlarged cervical chain lymph nodes are identified. Vascular: Partially imaged right chest infusion port catheter. The major vascular structures of the neck are patent. Atherosclerotic plaque about both carotid bifurcations. Limited intracranial: No evidence of acute intracranial abnormality within the field of view. Visualized orbits: No orbital mass or acute orbital finding. Mastoids and visualized paranasal sinuses: Mucous retention cyst within a posterior left ethmoid air cell. Mild mucosal thickening scattered elsewhere within the bilateral ethmoid sinuses. Small mucous retention cyst, and minimal background mucosal thickening, within the right maxillary sinus. Minimal mucosal thickening within the left maxillary sinus. Skeleton: Multifocal osseous metastatic  disease, better delineated on same day whole-body nuclear medicine bone scan. Cervical spondylosis. Upper chest: Separately reported on same day CT chest/abdomen/pelvis. IMPRESSION: 1. Asymmetric prominence of the right pyriform sinus and right laryngeal ventricle, consistent with the provided history of vocal cord paralysis. 2. No mass or pathologically enlarged lymph nodes identified within the neck. 3. Mild paranasal sinus disease, as described. 4. Same day head CT head, CT chest/abdomen/pelvis and whole-body nuclear medicine bone scan separately reported. Electronically Signed   By: Kellie Simmering D.O.   On: 09/18/2022 13:04   CT CHEST ABDOMEN PELVIS W CONTRAST  Result Date: 09/17/2022 CLINICAL DATA:  Metastatic right lung cancer restaging * Tracking Code: BO * EXAM: CT CHEST, ABDOMEN, AND PELVIS WITH CONTRAST TECHNIQUE: Multidetector CT imaging of the chest, abdomen and pelvis was performed following the standard protocol during bolus administration of intravenous contrast. RADIATION DOSE REDUCTION: This exam was performed according to the departmental  dose-optimization program which includes automated exposure control, adjustment of the mA and/or kV according to patient size and/or use of iterative reconstruction technique. CONTRAST:  17mL OMNIPAQUE IOHEXOL 300 MG/ML  SOLN COMPARISON:  06/03/2022 FINDINGS: CT CHEST FINDINGS Cardiovascular: Right chest port catheter. Normal heart size. Coronary artery calcifications. No pericardial effusion. Mediastinum/Nodes: Similar matted appearing, enlarged high pretracheal and right superior mediastinal lymph nodes, largest measuring up to 2.5 x 2.0 cm. These appear to at least partially occlude the superior vena cava, which is effaced, with vascular collateralization about the mediastinum (series 3, image 17). Thyroid gland, trachea, and esophagus demonstrate no significant findings. Lungs/Pleura: Multiple new and enlarged bilateral pulmonary nodules, a previously seen nodule of the high superior segment left lower lobe measuring 0.6 cm, previously 0.3 cm (series 5, image 18). New nodule of the slightly inferior superior segment left lower lobe measuring 0.7 x 0.6 cm (series 5, image 24). New nodule of the peripheral right upper lobe measuring 0.6 cm (series 5, image 31). New nodule of the superior segment right lower lobe measuring 0.4 cm (series 5, image 34). No pleural effusion or pneumothorax. Musculoskeletal: No chest wall abnormality. No acute osseous findings. CT ABDOMEN PELVIS FINDINGS Hepatobiliary: No solid liver abnormality is seen. Unchanged fluid attenuation cyst of the anterior liver dome, benign, for which no specific further follow-up or characterization is required. No gallstones, gallbladder wall thickening, or biliary dilatation. Pancreas: Unremarkable. No pancreatic ductal dilatation or surrounding inflammatory changes. Spleen: Normal in size without significant abnormality. Adrenals/Urinary Tract: New left adrenal nodule measuring 1.9 x 1.7 cm (series 3, image 63). New thickening of the right adrenal  body (series 3, image 62). Kidneys are normal, without renal calculi, solid lesion, or hydronephrosis. Bladder is unremarkable. Stomach/Bowel: Stomach is within normal limits. Appendix appears normal. No evidence of bowel wall thickening, distention, or inflammatory changes. Descending and sigmoid diverticulosis. Vascular/Lymphatic: Aortic atherosclerosis. No enlarged abdominal or pelvic lymph nodes. Reproductive: No mass or other abnormality. Other: No abdominal wall hernia or abnormality. No ascites. Numerous new peritoneal and omental soft tissue nodules, for example in the ventral abdomen measuring 1.2 x 1.0 cm (series 3, image 82), and in the left paracolic gutter measuring 1.0 x 1.0 cm (series 3, image 83). Additional retroperitoneal and subcutaneous fat soft tissue nodules, for example a small soft tissue nodule of the right buttock measuring 0.4 cm (series 3, image 95). Musculoskeletal: No acute osseous findings. Status post intramedullary nail fixation of the right femur about an expansile, mixed lytic and sclerotic lesion, partially imaged (series 3, image 139). Unchanged irregular  sclerotic lesion of the left iliac wing (series 3, image 105). New sclerotic lesion of the sternal body (series 7, image 102). IMPRESSION: 1. Multiple new and enlarged bilateral pulmonary nodules. 2. Similar matted appearing, enlarged high pretracheal and right superior mediastinal lymph nodes. These appear to at least partially occlude the superior vena cava, which is effaced, with vascular collateralization about the mediastinum. 3. New left adrenal nodule. New thickening of the right adrenal body. 4. Numerous new peritoneal and omental soft tissue nodules, as well as retroperitoneal and subcutaneous fat soft tissue nodules. 5. New sclerotic lesion of the sternal body. Unchanged, partially imaged expansile lesion of the right femoral diaphysis and sclerotic lesion of the left iliac wing. 6. Findings are consistent with new and  worsened metastatic disease. 7. Coronary artery disease. Aortic Atherosclerosis (ICD10-I70.0). Electronically Signed   By: Delanna Ahmadi M.D.   On: 09/17/2022 14:09   NM Bone Scan Whole Body  Result Date: 09/16/2022 CLINICAL DATA:  RIGHT lung cancer, restaging, history of LEFT adrenal and RIGHT femoral metastases EXAM: NUCLEAR MEDICINE WHOLE BODY BONE SCAN TECHNIQUE: Whole body anterior and posterior images were obtained approximately 3 hours after intravenous injection of radiopharmaceutical. RADIOPHARMACEUTICALS:  21.54 mCi Technetium-26m MDP IV COMPARISON:  07/13/2022 Radiographic correlation: CT head chest abdomen pelvis 09/16/2022 FINDINGS: Multiple foci of abnormal osseous tracer accumulation are seen consistent with osseous metastatic disease. These include calvarium, thoracolumbar spine, pelvis, scapula, BILATERAL ribs, sternum, proximal RIGHT humerus, BILATERAL femora. Metastatic lesions have increased in size and number since previous study. IMPRESSION: Diffuse osseous metastatic disease progressive since 07/13/2022 Electronically Signed   By: Lavonia Dana M.D.   On: 09/16/2022 17:44   CT HEAD WO CONTRAST (5MM)  Result Date: 09/16/2022 CLINICAL DATA:  Metastatic right lung carcinoma, new vocal cord paralysis EXAM: CT HEAD WITHOUT CONTRAST TECHNIQUE: Contiguous axial images were obtained from the base of the skull through the vertex without intravenous contrast. RADIATION DOSE REDUCTION: This exam was performed according to the departmental dose-optimization program which includes automated exposure control, adjustment of the mA and/or kV according to patient size and/or use of iterative reconstruction technique. COMPARISON:  Previous MR brain done on 01/20/2021 FINDINGS: Brain: No acute intracranial findings are seen. There are no signs of bleeding within the cranium. Ventricles are not dilated. There is no focal edema or mass effect. Vascular: Unremarkable. Skull: Unremarkable. In the previous MRI,  there was an enhancing lesion in the right posterior parietal calvarium which could not be distinctly visualized in the noncontrast CT brain. Sinuses/Orbits: There is mucosal thickening in the ethmoid sinus. Other: None. IMPRESSION: No acute intracranial findings are seen in noncontrast CT brain. Chronic ethmoid sinusitis. Electronically Signed   By: Elmer Picker M.D.   On: 09/16/2022 15:26    PERFORMANCE STATUS (ECOG) : 1 - Symptomatic but completely ambulatory  Review of Systems Unless otherwise noted, a complete review of systems is negative.  Physical Exam General: NAD Cardiovascular: regular rate and rhythm Pulmonary: clear ant fields Abdomen: soft, nontender, + bowel sounds GU: no suprapubic tenderness Extremities: no edema, no joint deformities Skin: Herpetic rash left breast Neurological: Grossly nonfocal    Breast exam chaperoned by Moishe Spice, RN  IMPRESSION/PLAN: Neoplasm related pain -no spinal metastasis noted on recent CTs.  However, suspect pain is secondary to disease progression.  Pain is poorly controlled with short acting oxycodone, which patient takes regularly every 4-6 hours.  Will start OxyContin 10 mg every 12 hours.  Continue oxycodone IR as needed for breakthrough pain.  Discussed as needed bowel regimen.  Rash -herpetic appearance.  Will treat for shingles with 10 days of Valtrex.  Follow-up telephone visit 2 weeks  Patient expressed understanding and was in agreement with this plan. She also understands that She can call clinic at any time with any questions, concerns, or complaints.   Thank you for allowing me to participate in the care of this very pleasant patient.   Time Total: 20 minutes  Visit consisted of counseling and education dealing with the complex and emotionally intense issues of symptom management in the setting of serious illness.Greater than 50%  of this time was spent counseling and coordinating care related to the above  assessment and plan.  Signed by: Altha Harm, PhD, NP-C

## 2022-09-20 NOTE — Telephone Encounter (Signed)
Oral Oncology Patient Advocate Encounter   Received a call from patient's insurance that Prior Auth was still incorrect after representative told Alyson, pharmacist, that it was corrected. They requested more documentation faxed to them at [phone] [place] Case: [id number].   I have faxed that over and will continue to follow and update until final determination.   Berdine Addison, [facility] Patient [place]  [phone] (phone) [phone] (fax) 09/20/2022 12:00 PM

## 2022-09-20 NOTE — Telephone Encounter (Signed)
Oral Oncology Patient Advocate Encounter  Prior Authorization for Gina Sanchez has been approved.    Effective dates: 09/20/22 through 09/21/23  Patient must fill through [facility].    Gina Sanchez, [facility] Patient [place]  [phone] (phone) [phone] (fax) 09/20/2022 10:55 AM

## 2022-09-20 NOTE — Telephone Encounter (Signed)
Oral Oncology Patient Advocate Encounter  Received notification that the request for prior authorization for Lumakras has been denied due to:   Current plan approved criteria does not allow coverage of the requested product if there is a lack of clinical information including, but not limited to the following documentation: Your plan only covers this drug if it will be used as a single agent.Marland Kitchen     Berdine Addison, [facility] Patient [place]  [phone] (phone) [phone] (fax) 09/20/2022 9:50 AM

## 2022-09-20 NOTE — Telephone Encounter (Unsigned)
Oral Chemotherapy Pharmacist Encounter  Patient Education I spoke with patient for overview of new oral chemotherapy medication: Lumakras (sotorasib) for the treatment of metastatic lung cancer, planned duration until disease progression or unacceptable drug toxicity   Pt is doing well. Counseled patient on administration, dosing, side effects, monitoring, drug-food interactions, safe handling, storage, and disposal. Patient will take 3 tablets (960 mg total) by mouth daily .  Side effects include but not limited to: changes in liver function, decreased wbc/hgb, nausea, fatigue.    Reviewed with patient importance of keeping a medication schedule and plan for any missed doses.  After discussion with patient no patient barriers to medication adherence identified.   Gina Sanchez voiced understanding and appreciation. All questions answered. Medication handout provided.  Provided patient with [facility] phone number. Patient knows to call the office with questions or concerns. Oral Chemotherapy Navigation Clinic will continue to follow.  Darl Pikes, PharmD, BCPS, BCOP, CPP Hematology/Oncology Clinical Pharmacist Practitioner [place]/DB/AP [facility] [phone]  09/20/2022 11:47 AM

## 2022-09-20 NOTE — Progress Notes (Signed)
Radiation Oncology Follow up Note old patient new area early SVC  Name: Gina Sanchez   Date:   09/20/2022 MRN:  [id number] DOB: 01-01-1966    This 57 y.o. 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  REFERRING PROVIDER: Remi Haggard, FNP  HPI: Patient is a 57 year old female well-known to our department having received radiation therapy to her right femur for metastatic involvement of stage IV adenocarcinoma seen 2 years prior.  She has done well from that standpoint.  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  She is being treated by Dr. Janese Banks with Alimta.  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  She has some facial swelling at this time.  She is now referred to radiation collagen for consideration of palliation to her chest.  She does have bone pain although currently is being controlled with pain medications.  COMPLICATIONS OF TREATMENT: none  FOLLOW UP COMPLIANCE: keeps appointments   PHYSICAL EXAM:  LMP 09/15/2018  Patient does have obvious facial swelling no venous jugular distention is noted no collateral veins are noted in her chest.  Well-developed well-nourished patient in NAD. HEENT reveals PERLA, EOMI, discs not visualized.  Oral cavity is clear. No oral mucosal lesions are identified. Neck is clear without evidence of cervical or supraclavicular adenopathy. Lungs are clear to A&P. Cardiac examination is essentially unremarkable with regular rate and rhythm without murmur rub or thrill. Abdomen is benign with no organomegaly or masses noted. Motor sensory and DTR levels are equal and symmetric in the upper and lower extremities. Cranial  nerves II through XII are grossly intact. Proprioception is intact. No peripheral adenopathy or edema is identified. No motor or sensory levels are noted. Crude visual fields are within normal range.  RADIOLOGY RESULTS: CT scans reviewed compatible with above-stated findings  PLAN: This time lets go ahead with palliative radiation therapy to her chest.  Would plan on delivering 30 Gray in 10 fractions.  I have stimulated her today and will start her treatments at this week.  Risks and benefits of treatment occluding possible radiation esophagitis fatigue alteration of blood counts possible inclusion of some normal lung all were discussed in detail with the patient.  She comprehends my treatment plan well.  Simulation was performed today.  I would like to take this opportunity to thank you for allowing me to participate in the care of your patient.Noreene Filbert, MD

## 2022-09-21 ENCOUNTER — Ambulatory Visit: Payer: 59 | Admitting: Dietician

## 2022-09-21 DIAGNOSIS — Z510 Encounter for antineoplastic radiation therapy: Secondary | ICD-10-CM | POA: Diagnosis not present

## 2022-09-21 NOTE — Telephone Encounter (Signed)
Oral Oncology Patient Advocate Encounter  Prior Authorization for Gina Sanchez has been approved.    PA# [id number]  Effective dates: 01.08.24 through 01.08.25.    Berdine Addison, [facility] Patient [place]  [phone] (phone) [phone] (fax) 09/21/2022 9:30 AM

## 2022-09-21 NOTE — Progress Notes (Signed)
Nutrition Assessment   Reason for Assessment: MST screen for weight loss.    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  I reached out to her home telephone number to speak with her about recent weight loss.  She reports she has colitis and has loose stools each time she eats and that his has been a concern for over a year.  PMHx includes UC and C-diff.She also reports little appetite.   She takes Entivio for UC but no other anti-diarrheal meds.  She wouldn't relay usual intake.  I tried to probe for intake but few details there than she usually skips breakfast and has used some ONS but not regularly.Marland Kitchen    Anthropometrics: 13# (6.4%) past 3 months  Height: 66" Weight: 189#  BMI: 30.51  INTERVENTION:   Relayed that nutrition services are provided at no charge.  Educated on importance of adequate calorie and protein energy intake  with nutrient dense foods when possible to maintain lean body mass.  Encouraged increasing soluble fibers and healthy fats.  Suggested oral nutrition supplements as meal replacement in mornings. Discussed strategies for management of diarrhea. Emailed Nutrition Tip sheet  for  diarrhea with contact information provided   MONITORING, EVALUATION, GOAL: weight, PO intake, Nutrition Impact Symptoms, labs Goal is weight maintenance  Next Visit: PRN at patient or provider request encouraged her to reach out if unable to maintain weight or control bowels.  April Manson, RDN, LDN Registered Dietitian, [place] Part Time Remote (Usual office hours: Tuesday-Thursday) Cell: [phone]

## 2022-09-22 ENCOUNTER — Ambulatory Visit
Admission: RE | Admit: 2022-09-22 | Discharge: 2022-09-22 | Disposition: A | Discharge status: Home / Self Care | Payer: 59 | Source: Ambulatory Visit | Attending: Radiation Oncology | Admitting: Radiation Oncology

## 2022-09-22 ENCOUNTER — Other Ambulatory Visit: Payer: Self-pay | Admitting: *Deleted

## 2022-09-22 ENCOUNTER — Other Ambulatory Visit: Payer: Self-pay

## 2022-09-22 DIAGNOSIS — C3480 Malignant neoplasm of overlapping sites of unspecified bronchus and lung: Secondary | ICD-10-CM | POA: Diagnosis not present

## 2022-09-22 DIAGNOSIS — Z510 Encounter for antineoplastic radiation therapy: Secondary | ICD-10-CM | POA: Diagnosis not present

## 2022-09-22 DIAGNOSIS — Z87891 Personal history of nicotine dependence: Secondary | ICD-10-CM | POA: Diagnosis not present

## 2022-09-22 DIAGNOSIS — C3490 Malignant neoplasm of unspecified part of unspecified bronchus or lung: Secondary | ICD-10-CM

## 2022-09-22 LAB — RAD ONC ARIA SESSION SUMMARY
Course Elapsed Days: 0
Plan Fractions Treated to Date: 1
Plan Prescribed Dose Per Fraction: 3 Gy
Plan Total Fractions Prescribed: 10
Plan Total Prescribed Dose: 30 Gy
Reference Point Dosage Given to Date: 3 Gy
Reference Point Session Dosage Given: 3 Gy
Session Number: 1

## 2022-09-22 NOTE — Telephone Encounter (Signed)
Reached out to [employer] for shipping status. Patient was added to phone call for shipping confirmation.  Patient Copay is $0  Laray Anger, PharmD PGY-2 Pharmacy Resident Hematology/Oncology [phone]  09/22/2022 9:27 AM

## 2022-09-23 ENCOUNTER — Ambulatory Visit
Admission: RE | Admit: 2022-09-23 | Discharge: 2022-09-23 | Disposition: A | Discharge status: Home / Self Care | Payer: 59 | Source: Ambulatory Visit | Attending: Radiation Oncology | Admitting: Radiation Oncology

## 2022-09-23 ENCOUNTER — Inpatient Hospital Stay (HOSPITAL_BASED_OUTPATIENT_CLINIC_OR_DEPARTMENT_OTHER): Payer: 59 | Admitting: Oncology

## 2022-09-23 ENCOUNTER — Encounter: Payer: Self-pay | Admitting: Oncology

## 2022-09-23 ENCOUNTER — Other Ambulatory Visit: Payer: Self-pay

## 2022-09-23 ENCOUNTER — Inpatient Hospital Stay: Payer: 59

## 2022-09-23 ENCOUNTER — Telehealth: Payer: Self-pay | Admitting: Pharmacist

## 2022-09-23 DIAGNOSIS — Z87891 Personal history of nicotine dependence: Secondary | ICD-10-CM | POA: Diagnosis not present

## 2022-09-23 DIAGNOSIS — C3491 Malignant neoplasm of unspecified part of right bronchus or lung: Secondary | ICD-10-CM | POA: Diagnosis not present

## 2022-09-23 DIAGNOSIS — C3490 Malignant neoplasm of unspecified part of unspecified bronchus or lung: Secondary | ICD-10-CM

## 2022-09-23 DIAGNOSIS — Z510 Encounter for antineoplastic radiation therapy: Secondary | ICD-10-CM | POA: Diagnosis not present

## 2022-09-23 DIAGNOSIS — C3480 Malignant neoplasm of overlapping sites of unspecified bronchus and lung: Secondary | ICD-10-CM | POA: Diagnosis not present

## 2022-09-23 LAB — CBC
HCT: 34.7 % — ABNORMAL LOW (ref 36.0–46.0)
Hemoglobin: 11.3 g/dL — ABNORMAL LOW (ref 12.0–15.0)
MCH: 26.5 pg (ref 26.0–34.0)
MCHC: 32.6 g/dL (ref 30.0–36.0)
MCV: 81.3 fL (ref 80.0–100.0)
Platelets: 175 10*3/uL (ref 150–400)
RBC: 4.27 MIL/uL (ref 3.87–5.11)
RDW: 17.2 % — ABNORMAL HIGH (ref 11.5–15.5)
WBC: 4.1 10*3/uL (ref 4.0–10.5)
nRBC: 0 % (ref 0.0–0.2)

## 2022-09-23 LAB — RAD ONC ARIA SESSION SUMMARY
Course Elapsed Days: 1
Plan Fractions Treated to Date: 2
Plan Prescribed Dose Per Fraction: 3 Gy
Plan Total Fractions Prescribed: 10
Plan Total Prescribed Dose: 30 Gy
Reference Point Dosage Given to Date: 6 Gy
Reference Point Session Dosage Given: 3 Gy
Session Number: 2

## 2022-09-23 NOTE — Telephone Encounter (Signed)
Oral Chemotherapy Pharmacist Encounter   Received notification from Dr. Janese Banks that patient was nervous about started on full dose Lumakras and wants to be started on a reduced dose. Patient's Lumakras 320mg  tabs will be delivered on 09/24/22.   Instead of taking three 320mg  tablets (960mg  total), she will take two 320mg  tablets (640mg ). Called to discuss the new starting dose, she stated her understanding. She knows to call the office is she has any concerns once started on treatment.   Patient medication list was updated to reflect this new dose.    Darl Pikes, PharmD, BCPS, BCOP, CPP Hematology/Oncology Clinical Pharmacist [place]/DB/AP Oral [facility] [phone]  09/23/2022 10:19 AM

## 2022-09-24 ENCOUNTER — Ambulatory Visit
Admission: RE | Admit: 2022-09-24 | Discharge: 2022-09-24 | Disposition: A | Discharge status: Home / Self Care | Payer: 59 | Source: Ambulatory Visit | Attending: Radiation Oncology | Admitting: Radiation Oncology

## 2022-09-24 ENCOUNTER — Other Ambulatory Visit: Payer: Self-pay

## 2022-09-24 DIAGNOSIS — Z87891 Personal history of nicotine dependence: Secondary | ICD-10-CM | POA: Diagnosis not present

## 2022-09-24 DIAGNOSIS — C3480 Malignant neoplasm of overlapping sites of unspecified bronchus and lung: Secondary | ICD-10-CM | POA: Diagnosis not present

## 2022-09-24 DIAGNOSIS — Z510 Encounter for antineoplastic radiation therapy: Secondary | ICD-10-CM | POA: Diagnosis not present

## 2022-09-24 LAB — RAD ONC ARIA SESSION SUMMARY
Course Elapsed Days: 2
Plan Fractions Treated to Date: 3
Plan Prescribed Dose Per Fraction: 3 Gy
Plan Total Fractions Prescribed: 10
Plan Total Prescribed Dose: 30 Gy
Reference Point Dosage Given to Date: 9 Gy
Reference Point Session Dosage Given: 3 Gy
Session Number: 3

## 2022-09-27 ENCOUNTER — Other Ambulatory Visit: Payer: Self-pay

## 2022-09-27 ENCOUNTER — Ambulatory Visit
Admission: RE | Admit: 2022-09-27 | Discharge: 2022-09-27 | Disposition: A | Discharge status: Home / Self Care | Payer: 59 | Source: Ambulatory Visit | Attending: Radiation Oncology | Admitting: Radiation Oncology

## 2022-09-27 ENCOUNTER — Telehealth: Payer: Self-pay | Admitting: *Deleted

## 2022-09-27 DIAGNOSIS — Z87891 Personal history of nicotine dependence: Secondary | ICD-10-CM | POA: Diagnosis not present

## 2022-09-27 DIAGNOSIS — Z510 Encounter for antineoplastic radiation therapy: Secondary | ICD-10-CM | POA: Diagnosis not present

## 2022-09-27 DIAGNOSIS — C3480 Malignant neoplasm of overlapping sites of unspecified bronchus and lung: Secondary | ICD-10-CM | POA: Diagnosis not present

## 2022-09-27 LAB — RAD ONC ARIA SESSION SUMMARY
Course Elapsed Days: 5
Plan Fractions Treated to Date: 4
Plan Prescribed Dose Per Fraction: 3 Gy
Plan Total Fractions Prescribed: 10
Plan Total Prescribed Dose: 30 Gy
Reference Point Dosage Given to Date: 12 Gy
Reference Point Session Dosage Given: 3 Gy
Session Number: 4

## 2022-09-28 ENCOUNTER — Other Ambulatory Visit: Payer: Self-pay

## 2022-09-28 ENCOUNTER — Ambulatory Visit
Admission: RE | Admit: 2022-09-28 | Discharge: 2022-09-28 | Disposition: A | Discharge status: Home / Self Care | Payer: 59 | Source: Ambulatory Visit | Attending: Radiation Oncology | Admitting: Radiation Oncology

## 2022-09-28 DIAGNOSIS — C3480 Malignant neoplasm of overlapping sites of unspecified bronchus and lung: Secondary | ICD-10-CM | POA: Diagnosis not present

## 2022-09-28 DIAGNOSIS — Z87891 Personal history of nicotine dependence: Secondary | ICD-10-CM | POA: Diagnosis not present

## 2022-09-28 DIAGNOSIS — Z510 Encounter for antineoplastic radiation therapy: Secondary | ICD-10-CM | POA: Diagnosis not present

## 2022-09-28 LAB — RAD ONC ARIA SESSION SUMMARY
Course Elapsed Days: 6
Plan Fractions Treated to Date: 5
Plan Prescribed Dose Per Fraction: 3 Gy
Plan Total Fractions Prescribed: 10
Plan Total Prescribed Dose: 30 Gy
Reference Point Dosage Given to Date: 15 Gy
Reference Point Session Dosage Given: 3 Gy
Session Number: 5

## 2022-09-29 ENCOUNTER — Ambulatory Visit
Admission: RE | Admit: 2022-09-29 | Discharge: 2022-09-29 | Disposition: A | Discharge status: Home / Self Care | Payer: 59 | Source: Ambulatory Visit | Attending: Radiation Oncology | Admitting: Radiation Oncology

## 2022-09-29 ENCOUNTER — Other Ambulatory Visit: Payer: Self-pay

## 2022-09-29 ENCOUNTER — Telehealth: Payer: Self-pay | Admitting: Gastroenterology

## 2022-09-29 DIAGNOSIS — Z87891 Personal history of nicotine dependence: Secondary | ICD-10-CM | POA: Diagnosis not present

## 2022-09-29 DIAGNOSIS — C3480 Malignant neoplasm of overlapping sites of unspecified bronchus and lung: Secondary | ICD-10-CM | POA: Diagnosis not present

## 2022-09-29 DIAGNOSIS — Z510 Encounter for antineoplastic radiation therapy: Secondary | ICD-10-CM | POA: Diagnosis not present

## 2022-09-29 LAB — RAD ONC ARIA SESSION SUMMARY
Course Elapsed Days: 7
Plan Fractions Treated to Date: 6
Plan Prescribed Dose Per Fraction: 3 Gy
Plan Total Fractions Prescribed: 10
Plan Total Prescribed Dose: 30 Gy
Reference Point Dosage Given to Date: 18 Gy
Reference Point Session Dosage Given: 3 Gy
Session Number: 6

## 2022-09-29 NOTE — Telephone Encounter (Signed)
Called [facility] they said they had the PA for every 4 weeks but they are working to get it to go through on her insurance

## 2022-09-29 NOTE — Telephone Encounter (Signed)
Pt called in ref to prior auth on her entyvio pt called pharmacy medication was not ready

## 2022-09-30 ENCOUNTER — Inpatient Hospital Stay: Payer: 59

## 2022-09-30 ENCOUNTER — Ambulatory Visit
Admission: RE | Admit: 2022-09-30 | Discharge: 2022-09-30 | Disposition: A | Discharge status: Home / Self Care | Payer: 59 | Source: Ambulatory Visit | Attending: Radiation Oncology | Admitting: Radiation Oncology

## 2022-09-30 ENCOUNTER — Telehealth: Payer: Self-pay

## 2022-09-30 ENCOUNTER — Other Ambulatory Visit: Payer: Self-pay

## 2022-09-30 DIAGNOSIS — Z87891 Personal history of nicotine dependence: Secondary | ICD-10-CM | POA: Diagnosis not present

## 2022-09-30 DIAGNOSIS — K523 Indeterminate colitis: Secondary | ICD-10-CM | POA: Diagnosis not present

## 2022-09-30 DIAGNOSIS — C3480 Malignant neoplasm of overlapping sites of unspecified bronchus and lung: Secondary | ICD-10-CM | POA: Diagnosis not present

## 2022-09-30 DIAGNOSIS — C3491 Malignant neoplasm of unspecified part of right bronchus or lung: Secondary | ICD-10-CM | POA: Diagnosis not present

## 2022-09-30 DIAGNOSIS — Z510 Encounter for antineoplastic radiation therapy: Secondary | ICD-10-CM | POA: Diagnosis not present

## 2022-09-30 LAB — RAD ONC ARIA SESSION SUMMARY
Course Elapsed Days: 8
Plan Fractions Treated to Date: 7
Plan Prescribed Dose Per Fraction: 3 Gy
Plan Total Fractions Prescribed: 10
Plan Total Prescribed Dose: 30 Gy
Reference Point Dosage Given to Date: 21 Gy
Reference Point Session Dosage Given: 3 Gy
Session Number: 7

## 2022-09-30 NOTE — Telephone Encounter (Signed)
[employer] and they said we had to do the authorizations by fax.  They said Berkley Harvey is approved through 10/14/2022 Auth number [id number]. It is approved from 09/15/2022 to 10/14/2022

## 2022-09-30 NOTE — Telephone Encounter (Signed)
[facility] after the second person I talk to she called the Benefits department and they called [employer] and was able to get the Medication to go through [employer]. They said [facility]. Also filled out PA form and fax form to [place] since [place] is expiring on 10/14/2022.

## 2022-09-30 NOTE — Telephone Encounter (Signed)
Patient was last seen on 03/11/2022 when will patient need to follow up with you

## 2022-09-30 NOTE — Telephone Encounter (Signed)
Please schedule a 33-month visit, okay to overbook  RV

## 2022-09-30 NOTE — Telephone Encounter (Signed)
Made appointment 10/25/2022 at 1:00

## 2022-10-01 ENCOUNTER — Ambulatory Visit
Admission: RE | Admit: 2022-10-01 | Discharge: 2022-10-01 | Disposition: A | Discharge status: Home / Self Care | Payer: 59 | Source: Ambulatory Visit | Attending: Radiation Oncology | Admitting: Radiation Oncology

## 2022-10-01 ENCOUNTER — Other Ambulatory Visit: Payer: Self-pay

## 2022-10-01 DIAGNOSIS — C3480 Malignant neoplasm of overlapping sites of unspecified bronchus and lung: Secondary | ICD-10-CM | POA: Diagnosis not present

## 2022-10-01 DIAGNOSIS — Z87891 Personal history of nicotine dependence: Secondary | ICD-10-CM | POA: Diagnosis not present

## 2022-10-01 DIAGNOSIS — Z510 Encounter for antineoplastic radiation therapy: Secondary | ICD-10-CM | POA: Diagnosis not present

## 2022-10-01 LAB — RAD ONC ARIA SESSION SUMMARY
Course Elapsed Days: 9
Plan Fractions Treated to Date: 8
Plan Prescribed Dose Per Fraction: 3 Gy
Plan Total Fractions Prescribed: 10
Plan Total Prescribed Dose: 30 Gy
Reference Point Dosage Given to Date: 24 Gy
Reference Point Session Dosage Given: 3 Gy
Session Number: 8

## 2022-10-02 ENCOUNTER — Encounter: Payer: Self-pay | Admitting: Oncology

## 2022-10-02 NOTE — Progress Notes (Signed)
I connected with Gina Sanchez on 10/02/22 at  9:00 AM EST by video enabled telemedicine visit and verified that I am speaking with the correct person using two identifiers.   I discussed the limitations, risks, security and privacy concerns of performing an evaluation and management service by telemedicine and the availability of in-person appointments. I also discussed with the patient that there may be a patient responsible charge related to this service. The patient expressed understanding and agreed to proceed.  Other persons participating in the visit and their role in the encounter:  patients husband  Patient's location:  home Provider's location:  home  Chief Complaint: Discuss CT scan results and further management  History of present illness: Patient is a 57 year old female with a past medical history significant for hypertension hyperlipidemia and anxiety who presented with right thigh pain and was found to have an acute right proximal femoral shaft fracture.  She underwent operative fixation on 10/12/2020.  MRI of femur showed heterogeneously enhancing osseous lesion within the area which would be nonspecific versus office neoplasm or metastatic lesion.  CT chest abdomen and pelvis with contrast showed an enlarged pretracheal lymph node 2.2 x 1.5 cm and a right paratracheal lymph node measuring 2.7 x 1.3 cm.  Prevascular node measuring 0.3 x 0.9 cm.  5 x 4 mm right middle lobe nodule.  2.8 x 1.9 cm left adrenal lesion.    Reamings from the right femur showed metastatic poorly differentiated carcinoma.  Immunohistochemistry showed was positive for pancytokeratin, CK7 and patchy CK20 with patchy dim expression of TTF-1.  Cells negative for Melan-A, CDX2, PAX8, Napsin A, GATA3, p40, CD56, p16 and thyroglobulin.  Findings compatible with metastatic carcinoma but because of decalcification immunohistochemical staining is unreliable.  Patchy dim staining with TTF-1 suspicious for lung primary but  not a definitive diagnosis.   Repeat supraclavicular lymph node biopsy showed metastatic adenocarcinoma.  Tumor cells positive for CK7 with focal weak staining for TTF-1.  Suggestive of lung origin in the proper clinical context.  Cells were negative for GATA3 PAX8 CDX2 and CK20 and Napsin A.  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  She is currently on hydrocortisone twice daily.  Thyroid functions presently are normal     Patient was treated with Norma Fredrickson Alimta Keytruda chemotherapy first-line followed by maintenance Alimta and Keytruda.  She developed autoimmune colitis secondary to [facility] and therefore that was stopped.  She is on Entyvio and follows up with GI for her autoimmune colitis.  Most recent regimen was maintenance Alimta  Interval history patient does report some increasing head pressure.  Has ongoing hoarseness of voice.  Also reports mild generalized body aches as well as back pain.   Review of Systems  Constitutional:  Positive for malaise/fatigue. Negative for chills, fever and weight loss.  HENT:  Negative for congestion, ear discharge and nosebleeds.   Eyes:  Negative for blurred vision.  Respiratory:  Negative for cough, hemoptysis, sputum production, shortness of breath and wheezing.   Cardiovascular:  Negative for chest pain, palpitations, orthopnea and claudication.  Gastrointestinal:  Negative for abdominal pain, blood in stool, constipation, diarrhea, heartburn, melena, nausea and vomiting.  Genitourinary:  Negative for dysuria, flank pain, frequency, hematuria and urgency.  Musculoskeletal:  Positive for back pain. Negative for joint pain and myalgias.  Skin:  Negative for rash.  Neurological:  Negative  for dizziness, tingling, focal weakness, seizures, weakness and headaches.  Endo/Heme/Allergies:  Does not bruise/bleed easily.   Psychiatric/Behavioral:  Negative for depression and suicidal ideas. The patient does not have insomnia.     Allergies  Allergen Reactions   Factive [Gemifloxacin] Rash    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   Palpitations    Precordial chest pain    Wrist fracture 03/2021   left    Past Surgical History:  Procedure Laterality Date   BREAST BIOPSY Right 2017   benign   CERVICAL BIOPSY  W/ LOOP ELECTRODE EXCISION     COLONOSCOPY  06/11/2022   Procedure: COLONOSCOPY;  Surgeon: Toney Reil, MD;  Location: [facility] ENDOSCOPY;  Service: Gastroenterology;;   COLONOSCOPY WITH PROPOFOL N/A 07/03/2015   Procedure: COLONOSCOPY WITH PROPOFOL;  Surgeon: Wallace Cullens, MD;  Location: [facility] ENDOSCOPY;  Service: Gastroenterology;  Laterality: N/A;   COLONOSCOPY WITH PROPOFOL N/A 01/06/2022   Procedure: COLONOSCOPY WITH PROPOFOL;  Surgeon: Toney Reil, MD;  Location: [facility] ENDOSCOPY;  Service: Gastroenterology;  Laterality: N/A;   ESOPHAGOGASTRODUODENOSCOPY (EGD) WITH PROPOFOL N/A 07/03/2015   Procedure: ESOPHAGOGASTRODUODENOSCOPY (EGD) WITH PROPOFOL;  Surgeon: Wallace Cullens, MD;  Location: [facility] ENDOSCOPY;  Service: Gastroenterology;  Laterality: N/A;   FLEXIBLE SIGMOIDOSCOPY N/A 03/12/2022   Procedure: FLEXIBLE SIGMOIDOSCOPY;  Surgeon: Toney Reil, MD;  Location: [facility] ENDOSCOPY;  Service: Gastroenterology;  Laterality: N/A;   FLEXIBLE SIGMOIDOSCOPY N/A 08/17/2022   Procedure: FLEXIBLE SIGMOIDOSCOPY;  Surgeon: Toney Reil, MD;  Location: [facility] ENDOSCOPY;  Service: Gastroenterology;  Laterality: N/A;   FRACTURE SURGERY     INTRAMEDULLARY (IM) NAIL INTERTROCHANTERIC Right 09/15/2020   Procedure: INTRAMEDULLARY (IM) NAIL INTERTROCHANTRIC;   Surgeon: Christena Flake, MD;  Location: [facility] ORS;  Service: Orthopedics;  Laterality: Right;   IR CV LINE INJECTION  07/14/2022   IR IMAGING GUIDED PORT INSERTION  11/28/2020   LEFT HEART CATH AND CORONARY ANGIOGRAPHY N/A 03/28/2017   Procedure: Left Heart Cath and Coronary Angiography;  Surgeon: Runell Gess, MD;  Location: [facility] INVASIVE CV LAB;  Service: Cardiovascular;  Laterality: N/A;   ORIF WRIST FRACTURE Left 03/31/2021   Procedure: OPEN REDUCTION INTERNAL FIXATION (ORIF) LEFT DISTAL RADIUS FRACTURE.;  Surgeon: Christena Flake, MD;  Location: [facility] ORS;  Service: Orthopedics;  Laterality: Left;    Social History   Socioeconomic History   Marital status: Married    Spouse name: Gene   Number of children: 2   Years of education: Not on file   Highest education level: Not on file  Occupational History   Not on file  Tobacco Use   Smoking status: Former    Packs/day: 1.00    Years: 30.00    Total pack years: 30.00    Types: Cigarettes    Quit date: 09/17/2020    Years since quitting: 2.0   Smokeless tobacco: Never  Vaping Use   Vaping Use: Never used  Substance and Sexual Activity   Alcohol use: Not Currently    Alcohol/week: 1.0 standard drink of alcohol    Types: 1 Standard drinks or equivalent per week    Comment: beer occ.   Drug use: No   Sexual activity:  Yes    Birth control/protection: Post-menopausal  Other Topics Concern   Not on file  Social History Narrative   Lives in [place] with her husband.  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   Social Determinants of Health   Financial Resource Strain: Not on file  Food Insecurity: Not on file  Transportation Needs: No Transportation Needs (09/20/2022)   PRAPARE - Administrator, Civil Service (Medical): No    Lack of Transportation (Non-Medical): No  Physical Activity: Not on file  Stress: Not on file  Social Connections: Not on file  Intimate Partner  Violence: Not on file    Family History  Problem Relation Age of Onset   Diabetes Mother        alive @ 46   Goiter Mother    Heart disease Father        died of MI @ 74   Osteoporosis Maternal Grandmother    Colon cancer Maternal Grandfather    Breast cancer Neg Hx    Ovarian cancer Neg Hx      Current Outpatient Medications:    acetaminophen (TYLENOL) 500 MG tablet, Take 500 mg by mouth every 6 (six) hours as needed., Disp: , Rfl:    ALPRAZolam (XANAX) 0.5 MG tablet, TAKE ONE TABLET DAILY AS NEEDED, Disp: 30 tablet, Rfl: 0   metoprolol tartrate (LOPRESSOR) 25 MG tablet, Take 25 mg by mouth in the morning., Disp: , Rfl:    ondansetron (ZOFRAN) 4 MG tablet, TAKE 1 TABLET BY MOUTH EVERY 8 HOURS AS NEEDED FOR NAUSEA AND VOMITING, Disp: 20 tablet, Rfl: 1   oxyCODONE (OXY IR/ROXICODONE) 5 MG immediate release tablet, TAKE 1-2 TABLETS BY MOUTH EVERY 4 HOURS AS NEEDED FOR MODERATE PAIN, Disp: 60 tablet, Rfl: 0   oxyCODONE (OXYCONTIN) 10 mg 12 hr tablet, Take 1 tablet (10 mg total) by mouth every 12 (twelve) hours., Disp: 30 tablet, Rfl: 0   valACYclovir (VALTREX) 1000 MG tablet, Take 1 tablet (1,000 mg total) by mouth 3 (three) times daily., Disp: 30 tablet, Rfl: 0   Vedolizumab (ENTYVIO IV), Inject 1 Dose into the vein as directed. Every 4 weeks at her home, Disp: , Rfl:    citalopram (CELEXA) 10 MG tablet, Take 10 mg by mouth daily. (Patient not taking: Reported on 09/15/2022), Disp: , Rfl:    hydrocortisone (CORTEF) 10 MG tablet, TAKE 1 TABLET BY MOUTH NIGHTLY (Patient not taking: Reported on 09/15/2022), Disp: 30 tablet, Rfl: 3   hydrocortisone (CORTEF) 20 MG tablet, TAKE 1 TABLET BY MOUTH EVERY MORNING (Patient not taking: Reported on 09/15/2022), Disp: 30 tablet, Rfl: 3   sotorasib (LUMAKRAS) 320 MG tablet, Take 640 mg by mouth daily., Disp: , Rfl:  No current facility-administered medications for this visit.  Facility-Administered Medications Ordered in Other Visits:    sodium chloride  flush (NS) 0.9 % injection 10 mL, 10 mL, Intravenous, PRN, Creig Hines, MD, 10 mL at 03/09/21 5584  CT SOFT TISSUE NECK W CONTRAST  Result Date: 09/18/2022 CLINICAL DATA:  Provided history: Primary malignant neoplasm of right lung metastatic to other site. Vocal cord paralysis. Right lung cancer with metastases and new vocal cord paralysis EXAM: CT NECK WITH CONTRAST TECHNIQUE: Multidetector CT imaging of the neck was performed using the standard protocol following the bolus administration of intravenous contrast. RADIATION DOSE REDUCTION: This exam was performed according to the departmental dose-optimization program which includes automated exposure control, adjustment of the mA  and/or kV according to patient size and/or use of iterative reconstruction technique. CONTRAST:  OMNIPAQUE IOHEXOL 300 MG/ML  SOLN COMPARISON:  Same day CT chest/abdomen/pelvis 09/16/2022. Same day head CT 09/16/2022. Same day whole body nuclear medicine bone scan 09/16/2022. FINDINGS: Pharynx and larynx: Punctate calcific foci within the bilateral palatine tonsils, which may reflect postinflammatory calcifications and/or tonsilloliths. Asymmetric prominence of the right pyriform sinus and right laryngeal ventricle, consistent with the provided history of vocal cord paralysis. Salivary glands: No inflammation, mass, or stone. Thyroid: 12 mm nodule within the right thyroid lobe, not meeting consensus criteria for ultrasound follow-up based on size. No follow-up imaging is recommended. Reference: J Am Coll Radiol. 2015 Feb;12(2): 143-50. Lymph nodes: No pathologically enlarged cervical chain lymph nodes are identified. Vascular: Partially imaged right chest infusion port catheter. The major vascular structures of the neck are patent. Atherosclerotic plaque about both carotid bifurcations. Limited intracranial: No evidence of acute intracranial abnormality within the field of view. Visualized orbits: No orbital mass or acute  orbital finding. Mastoids and visualized paranasal sinuses: Mucous retention cyst within a posterior left ethmoid air cell. Mild mucosal thickening scattered elsewhere within the bilateral ethmoid sinuses. Small mucous retention cyst, and minimal background mucosal thickening, within the right maxillary sinus. Minimal mucosal thickening within the left maxillary sinus. Skeleton: Multifocal osseous metastatic disease, better delineated on same day whole-body nuclear medicine bone scan. Cervical spondylosis. Upper chest: Separately reported on same day CT chest/abdomen/pelvis. IMPRESSION: 1. Asymmetric prominence of the right pyriform sinus and right laryngeal ventricle, consistent with the provided history of vocal cord paralysis. 2. No mass or pathologically enlarged lymph nodes identified within the neck. 3. Mild paranasal sinus disease, as described. 4. Same day head CT head, CT chest/abdomen/pelvis and whole-body nuclear medicine bone scan separately reported. Electronically Signed   By: Jackey Loge D.O.   On: 09/18/2022 13:04   CT CHEST ABDOMEN PELVIS W CONTRAST  Result Date: 09/17/2022 CLINICAL DATA:  Metastatic right lung cancer restaging * Tracking Code: BO * EXAM: CT CHEST, ABDOMEN, AND PELVIS WITH CONTRAST TECHNIQUE: Multidetector CT imaging of the chest, abdomen and pelvis was performed following the standard protocol during bolus administration of intravenous contrast. RADIATION DOSE REDUCTION: This exam was performed according to the departmental dose-optimization program which includes automated exposure control, adjustment of the mA and/or kV according to patient size and/or use of iterative reconstruction technique. CONTRAST:  OMNIPAQUE IOHEXOL 300 MG/ML  SOLN COMPARISON:  06/03/2022 FINDINGS: CT CHEST FINDINGS Cardiovascular: Right chest port catheter. Normal heart size. Coronary artery calcifications. No pericardial effusion. Mediastinum/Nodes: Similar matted appearing, enlarged high  pretracheal and right superior mediastinal lymph nodes, largest measuring up to 2.5 x 2.0 cm. These appear to at least partially occlude the superior vena cava, which is effaced, with vascular collateralization about the mediastinum (series 3, image 17). Thyroid gland, trachea, and esophagus demonstrate no significant findings. Lungs/Pleura: Multiple new and enlarged bilateral pulmonary nodules, a previously seen nodule of the high superior segment left lower lobe measuring 0.6 cm, previously 0.3 cm (series 5, image 18). New nodule of the slightly inferior superior segment left lower lobe measuring 0.7 x 0.6 cm (series 5, image 24). New nodule of the peripheral right upper lobe measuring 0.6 cm (series 5, image 31). New nodule of the superior segment right lower lobe measuring 0.4 cm (series 5, image 34). No pleural effusion or pneumothorax. Musculoskeletal: No chest wall abnormality. No acute osseous findings. CT ABDOMEN PELVIS FINDINGS Hepatobiliary: No solid liver abnormality is  seen. Unchanged fluid attenuation cyst of the anterior liver dome, benign, for which no specific further follow-up or characterization is required. No gallstones, gallbladder wall thickening, or biliary dilatation. Pancreas: Unremarkable. No pancreatic ductal dilatation or surrounding inflammatory changes. Spleen: Normal in size without significant abnormality. Adrenals/Urinary Tract: New left adrenal nodule measuring 1.9 x 1.7 cm (series 3, image 63). New thickening of the right adrenal body (series 3, image 62). Kidneys are normal, without renal calculi, solid lesion, or hydronephrosis. Bladder is unremarkable. Stomach/Bowel: Stomach is within normal limits. Appendix appears normal. No evidence of bowel wall thickening, distention, or inflammatory changes. Descending and sigmoid diverticulosis. Vascular/Lymphatic: Aortic atherosclerosis. No enlarged abdominal or pelvic lymph nodes. Reproductive: No mass or other abnormality. Other: No  abdominal wall hernia or abnormality. No ascites. Numerous new peritoneal and omental soft tissue nodules, for example in the ventral abdomen measuring 1.2 x 1.0 cm (series 3, image 82), and in the left paracolic gutter measuring 1.0 x 1.0 cm (series 3, image 83). Additional retroperitoneal and subcutaneous fat soft tissue nodules, for example a small soft tissue nodule of the right buttock measuring 0.4 cm (series 3, image 95). Musculoskeletal: No acute osseous findings. Status post intramedullary nail fixation of the right femur about an expansile, mixed lytic and sclerotic lesion, partially imaged (series 3, image 139). Unchanged irregular sclerotic lesion of the left iliac wing (series 3, image 105). New sclerotic lesion of the sternal body (series 7, image 102). IMPRESSION: 1. Multiple new and enlarged bilateral pulmonary nodules. 2. Similar matted appearing, enlarged high pretracheal and right superior mediastinal lymph nodes. These appear to at least partially occlude the superior vena cava, which is effaced, with vascular collateralization about the mediastinum. 3. New left adrenal nodule. New thickening of the right adrenal body. 4. Numerous new peritoneal and omental soft tissue nodules, as well as retroperitoneal and subcutaneous fat soft tissue nodules. 5. New sclerotic lesion of the sternal body. Unchanged, partially imaged expansile lesion of the right femoral diaphysis and sclerotic lesion of the left iliac wing. 6. Findings are consistent with new and worsened metastatic disease. 7. Coronary artery disease. Aortic Atherosclerosis (ICD10-I70.0). Electronically Signed   By: Jearld Lesch M.D.   On: 09/17/2022 14:09   NM Bone Scan Whole Body  Result Date: 09/16/2022 CLINICAL DATA:  RIGHT lung cancer, restaging, history of LEFT adrenal and RIGHT femoral metastases EXAM: NUCLEAR MEDICINE WHOLE BODY BONE SCAN TECHNIQUE: Whole body anterior and posterior images were obtained approximately 3 hours after  intravenous injection of radiopharmaceutical. RADIOPHARMACEUTICALS:  21.54 mCi Technetium-9m MDP IV COMPARISON:  07/13/2022 Radiographic correlation: CT head chest abdomen pelvis 09/16/2022 FINDINGS: Multiple foci of abnormal osseous tracer accumulation are seen consistent with osseous metastatic disease. These include calvarium, thoracolumbar spine, pelvis, scapula, BILATERAL ribs, sternum, proximal RIGHT humerus, BILATERAL femora. Metastatic lesions have increased in size and number since previous study. IMPRESSION: Diffuse osseous metastatic disease progressive since 07/13/2022 Electronically Signed   By: Ulyses Southward M.D.   On: 09/16/2022 17:44   CT HEAD WO CONTRAST ( )  Result Date: 09/16/2022 CLINICAL DATA:  Metastatic right lung carcinoma, new vocal cord paralysis EXAM: CT HEAD WITHOUT CONTRAST TECHNIQUE: Contiguous axial images were obtained from the base of the skull through the vertex without intravenous contrast. RADIATION DOSE REDUCTION: This exam was performed according to the departmental dose-optimization program which includes automated exposure control, adjustment of the mA and/or kV according to patient size and/or use of iterative reconstruction technique. COMPARISON:  Previous MR brain done on 01/20/2021 FINDINGS:  Brain: No acute intracranial findings are seen. There are no signs of bleeding within the cranium. Ventricles are not dilated. There is no focal edema or mass effect. Vascular: Unremarkable. Skull: Unremarkable. In the previous MRI, there was an enhancing lesion in the right posterior parietal calvarium which could not be distinctly visualized in the noncontrast CT brain. Sinuses/Orbits: There is mucosal thickening in the ethmoid sinus. Other: None. IMPRESSION: No acute intracranial findings are seen in noncontrast CT brain. Chronic ethmoid sinusitis. Electronically Signed   By: Elmer Picker M.D.   On: 09/16/2022 15:26    No images are attached to the encounter.       Latest Ref Rng & Units 09/15/2022    1:05 PM  CMP  Glucose 70 - 99 mg/dL 140   BUN 6 - 20 mg/dL 18   Creatinine 0.44 - 1.00 mg/dL 0.66   Sodium 135 - 145 mmol/L 135   Potassium 3.5 - 5.1 mmol/L 4.0   Chloride 98 - 111 mmol/L 101   CO2 22 - 32 mmol/L 23   Calcium 8.9 - 10.3 mg/dL 8.2   Total Protein 6.5 - 8.1 g/dL 7.4   Total Bilirubin 0.3 - 1.2 mg/dL 0.8   Alkaline Phos 38 - 126 U/L 238   AST 15 - 41 U/L 18   ALT 0 - 44 U/L 19       Latest Ref Rng & Units 09/23/2022    2:51 PM  CBC  WBC 4.0 - 10.5 K/uL 4.1   Hemoglobin 12.0 - 15.0 g/dL 11.3   Hematocrit 36.0 - 46.0 % 34.7   Platelets 150 - 400 K/uL 175      Observation/objective: Appears in no acute distress over video visit today.  Breathing is nonlabored  Assessment and plan: Patient is a 57 year old female with metastatic adenocarcinoma of the lung currently on maintenance Alimta.  This is a visit to discuss CT scan results and further management  Most recently patient has been on maintenance Alimta.  I have reviewed CT chest abdomen and pelvis as well as CT soft tissue neck and bone scan images independently and discussed findings with the patient.  CT scans unfortunately show aggressive recurrence.  CT soft tissue neck shows prominence of right pyriform sinus and laryngeal ventricle which would be consistent with vocal cord paralysis.  It is unclear if this is related to her underlying malignancy.  No pathologic enlarged lymph nodes were noted in the neck.  CT chest abdomen and pelvis shows enlarging bilateral pulmonary nodules as well as high pretracheal and superior mediastinal lymph nodes.  New left adrenal nodule as well as peritoneal metastases.  Bone scan shows new lytic bone lesions which are progressive as compared to about 2023.  On her NGS testing patient was found to have K-ras G12 C mutation.  I would therefore like to stop her Alimta at this time and have her start sotorasib.  The full dose of sotorasib is 960 mg  daily.  Discussed risks and benefits of the medication including all but not limited to nausea, vomiting, low blood counts, diarrhea and abdominal pain.  Given that patient has some ongoing diarrhea from her inflammatory bowel disease and concern for additional GI side effects I am having her take 640 mg daily instead of 960 mg daily.  Drug will be available for her tomorrow which she can start taking right away.  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  Neoplasm related pain: Being managed by palliative care  Follow-up instructions: As above  I discussed the assessment and treatment plan with the patient. The patient was provided an opportunity to ask questions and all were answered. The patient agreed with the plan and demonstrated an understanding of the instructions.   The patient was advised to call back or seek an in-person evaluation if the symptoms worsen or if the condition fails to improve as anticipated.  I provided 25 minutes of face-to-face video visit time during this encounter, and > 50% was spent counseling as documented under my assessment & plan.  Visit Diagnosis: 1. Primary malignant neoplasm of right lung metastatic to other site [facility])     Dr. Owens Shark, MD, MPH [facility] at [facility] Tel- [phone] 10/02/2022 7:16 PM

## 2022-10-04 ENCOUNTER — Other Ambulatory Visit: Payer: Self-pay

## 2022-10-04 ENCOUNTER — Ambulatory Visit
Admission: RE | Admit: 2022-10-04 | Discharge: 2022-10-04 | Disposition: A | Discharge status: Home / Self Care | Payer: 59 | Source: Ambulatory Visit | Attending: Radiation Oncology | Admitting: Radiation Oncology

## 2022-10-04 ENCOUNTER — Inpatient Hospital Stay (HOSPITAL_BASED_OUTPATIENT_CLINIC_OR_DEPARTMENT_OTHER): Payer: 59 | Admitting: Hospice and Palliative Medicine

## 2022-10-04 DIAGNOSIS — C3480 Malignant neoplasm of overlapping sites of unspecified bronchus and lung: Secondary | ICD-10-CM | POA: Diagnosis not present

## 2022-10-04 DIAGNOSIS — C3491 Malignant neoplasm of unspecified part of right bronchus or lung: Secondary | ICD-10-CM

## 2022-10-04 DIAGNOSIS — Z87891 Personal history of nicotine dependence: Secondary | ICD-10-CM | POA: Diagnosis not present

## 2022-10-04 DIAGNOSIS — G893 Neoplasm related pain (acute) (chronic): Secondary | ICD-10-CM

## 2022-10-04 DIAGNOSIS — Z510 Encounter for antineoplastic radiation therapy: Secondary | ICD-10-CM | POA: Diagnosis not present

## 2022-10-04 LAB — RAD ONC ARIA SESSION SUMMARY
Course Elapsed Days: 12
Plan Fractions Treated to Date: 9
Plan Prescribed Dose Per Fraction: 3 Gy
Plan Total Fractions Prescribed: 10
Plan Total Prescribed Dose: 30 Gy
Reference Point Dosage Given to Date: 27 Gy
Reference Point Session Dosage Given: 3 Gy
Session Number: 9

## 2022-10-04 MED ORDER — OXYCODONE HCL 5 MG PO TABS: 5.0000 mg | ORAL_TABLET | ORAL | 0 refills | Status: DC | PRN

## 2022-10-04 NOTE — Progress Notes (Signed)
Virtual Visit via Telephone Note  I connected with Gina Sanchez on 10/04/22 at 11:50 AM EST by telephone and verified that I am speaking with the correct person using two identifiers.  Location: Patient: Home Provider: Clinic   I discussed the limitations, risks, security and privacy concerns of performing an evaluation and management service by telephone and the availability of in person appointments. I also discussed with the patient that there may be a patient responsible charge related to this service. The patient expressed understanding and agreed to proceed.   History of Present Illness: Gina Sanchez is a 57 y.o. 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      Observations/Objective: I spoke with patient by phone.  She reports improvement in pain after initiating OxyContin.  She now describes pain is tolerable.  She request refill of oxycodone IR.  She denies any adverse effects from pain medications.  Patient reports improvement in her rash.  She denies other significant changes or concerns.  Assessment and Plan: Neoplasm related pain -continue OxyContin.  Refill oxycodone IR.  Daily bowel regimen   Follow Up Instructions: Follow-up telephone visit 1 month.  Patient to see Dr. Smith Robert next week   I discussed the assessment and treatment plan with the patient. The patient was provided an opportunity to ask questions and all were answered. The patient agreed with the plan and demonstrated an understanding of the instructions.   The patient was advised to call back or seek an in-person evaluation if the symptoms worsen or if the condition fails to improve as anticipated.  I provided 5 minutes of non-face-to-face time during this encounter.   Malachy Moan, NP

## 2022-10-04 NOTE — Telephone Encounter (Signed)
Called to check status of the PA for [facility] for after 10/14/22. They said they did not receive the PA refaxed it to them

## 2022-10-05 ENCOUNTER — Other Ambulatory Visit: Payer: Self-pay

## 2022-10-05 ENCOUNTER — Ambulatory Visit
Admission: RE | Admit: 2022-10-05 | Discharge: 2022-10-05 | Disposition: A | Discharge status: Home / Self Care | Payer: 59 | Source: Ambulatory Visit | Attending: Radiation Oncology | Admitting: Radiation Oncology

## 2022-10-05 ENCOUNTER — Telehealth: Payer: 59 | Admitting: Hospice and Palliative Medicine

## 2022-10-05 DIAGNOSIS — C3480 Malignant neoplasm of overlapping sites of unspecified bronchus and lung: Secondary | ICD-10-CM | POA: Diagnosis not present

## 2022-10-05 DIAGNOSIS — Z510 Encounter for antineoplastic radiation therapy: Secondary | ICD-10-CM | POA: Diagnosis not present

## 2022-10-05 DIAGNOSIS — Z87891 Personal history of nicotine dependence: Secondary | ICD-10-CM | POA: Diagnosis not present

## 2022-10-05 LAB — RAD ONC ARIA SESSION SUMMARY
Course Elapsed Days: 13
Plan Fractions Treated to Date: 10
Plan Prescribed Dose Per Fraction: 3 Gy
Plan Total Fractions Prescribed: 10
Plan Total Prescribed Dose: 30 Gy
Reference Point Dosage Given to Date: 30 Gy
Reference Point Session Dosage Given: 3 Gy
Session Number: 10

## 2022-10-06 ENCOUNTER — Ambulatory Visit: Payer: 59

## 2022-10-06 ENCOUNTER — Ambulatory Visit: Payer: 59 | Admitting: Oncology

## 2022-10-06 ENCOUNTER — Other Ambulatory Visit: Payer: 59

## 2022-10-06 DIAGNOSIS — C3491 Malignant neoplasm of unspecified part of right bronchus or lung: Secondary | ICD-10-CM | POA: Diagnosis not present

## 2022-10-06 DIAGNOSIS — K523 Indeterminate colitis: Secondary | ICD-10-CM | POA: Diagnosis not present

## 2022-10-06 NOTE — Telephone Encounter (Signed)
Called and the Case for the [facility] is pending. Pending case number is [id number]

## 2022-10-07 NOTE — Telephone Encounter (Signed)
PA has been approved from 10/16/2022 to 11/13/2022.

## 2022-10-11 ENCOUNTER — Other Ambulatory Visit: Payer: Self-pay | Admitting: Oncology

## 2022-10-12 ENCOUNTER — Other Ambulatory Visit: Payer: Self-pay | Admitting: *Deleted

## 2022-10-12 MED ORDER — OXYCODONE HCL ER 10 MG PO T12A: 10.0000 mg | EXTENDED_RELEASE_TABLET | Freq: Two times a day (BID) | ORAL | 0 refills | Status: DC

## 2022-10-12 NOTE — Telephone Encounter (Signed)
RF request for oxycodone 10 mg

## 2022-10-13 ENCOUNTER — Other Ambulatory Visit: Payer: Self-pay

## 2022-10-13 ENCOUNTER — Other Ambulatory Visit: Payer: Self-pay | Admitting: *Deleted

## 2022-10-13 ENCOUNTER — Telehealth: Payer: Self-pay | Admitting: *Deleted

## 2022-10-13 ENCOUNTER — Inpatient Hospital Stay (HOSPITAL_BASED_OUTPATIENT_CLINIC_OR_DEPARTMENT_OTHER): Payer: 59 | Admitting: Oncology

## 2022-10-13 ENCOUNTER — Inpatient Hospital Stay: Payer: 59

## 2022-10-13 ENCOUNTER — Encounter: Payer: Self-pay | Admitting: Oncology

## 2022-10-13 VITALS — BP 112/79 | HR 93 | Resp 18 | Ht 66.0 in | Wt 192.0 lb

## 2022-10-13 DIAGNOSIS — C3491 Malignant neoplasm of unspecified part of right bronchus or lung: Secondary | ICD-10-CM | POA: Diagnosis not present

## 2022-10-13 DIAGNOSIS — Z79899 Other long term (current) drug therapy: Secondary | ICD-10-CM | POA: Diagnosis not present

## 2022-10-13 DIAGNOSIS — Z510 Encounter for antineoplastic radiation therapy: Secondary | ICD-10-CM | POA: Diagnosis not present

## 2022-10-13 DIAGNOSIS — Z5181 Encounter for therapeutic drug level monitoring: Secondary | ICD-10-CM | POA: Diagnosis not present

## 2022-10-13 DIAGNOSIS — Z7983 Long term (current) use of bisphosphonates: Secondary | ICD-10-CM | POA: Diagnosis not present

## 2022-10-13 LAB — CBC WITH DIFFERENTIAL/PLATELET
Abs Immature Granulocytes: 0.04 10*3/uL (ref 0.00–0.07)
Basophils Absolute: 0.1 10*3/uL (ref 0.0–0.1)
Basophils Relative: 1 %
Eosinophils Absolute: 0.1 10*3/uL (ref 0.0–0.5)
Eosinophils Relative: 1 %
HCT: 36.9 % (ref 36.0–46.0)
Hemoglobin: 11.5 g/dL — ABNORMAL LOW (ref 12.0–15.0)
Immature Granulocytes: 1 %
Lymphocytes Relative: 11 %
Lymphs Abs: 0.9 10*3/uL (ref 0.7–4.0)
MCH: 26.3 pg (ref 26.0–34.0)
MCHC: 31.2 g/dL (ref 30.0–36.0)
MCV: 84.4 fL (ref 80.0–100.0)
Monocytes Absolute: 0.4 10*3/uL (ref 0.1–1.0)
Monocytes Relative: 5 %
Neutro Abs: 7 10*3/uL (ref 1.7–7.7)
Neutrophils Relative %: 81 %
Platelets: 350 10*3/uL (ref 150–400)
RBC: 4.37 MIL/uL (ref 3.87–5.11)
RDW: 18.6 % — ABNORMAL HIGH (ref 11.5–15.5)
WBC: 8.5 10*3/uL (ref 4.0–10.5)
nRBC: 0 % (ref 0.0–0.2)

## 2022-10-13 LAB — COMPREHENSIVE METABOLIC PANEL
ALT: 15 U/L (ref 0–44)
AST: 23 U/L (ref 15–41)
Albumin: 3.6 g/dL (ref 3.5–5.0)
Alkaline Phosphatase: 657 U/L — ABNORMAL HIGH (ref 38–126)
Anion gap: 11 (ref 5–15)
BUN: 9 mg/dL (ref 6–20)
CO2: 20 mmol/L — ABNORMAL LOW (ref 22–32)
Calcium: 8.6 mg/dL — ABNORMAL LOW (ref 8.9–10.3)
Chloride: 106 mmol/L (ref 98–111)
Creatinine, Ser: 0.65 mg/dL (ref 0.44–1.00)
GFR, Estimated: >60 mL/min (ref >60)
Glucose, Bld: 118 mg/dL — ABNORMAL HIGH (ref 70–99)
Potassium: 3.8 mmol/L (ref 3.5–5.1)
Sodium: 137 mmol/L (ref 135–145)
Total Bilirubin: 0.5 mg/dL (ref 0.3–1.2)
Total Protein: 7.2 g/dL (ref 6.5–8.1)

## 2022-10-13 MED ORDER — OXYCODONE HCL ER 10 MG PO T12A: 10.0000 mg | EXTENDED_RELEASE_TABLET | Freq: Two times a day (BID) | ORAL | 0 refills | Status: DC

## 2022-10-13 NOTE — Telephone Encounter (Signed)
Pa submitted for Gina Sanchez (Key: [id number]) Rx #: [id number] N OxyCONTIN 10MG  er tablets

## 2022-10-14 ENCOUNTER — Encounter: Payer: Self-pay | Admitting: Oncology

## 2022-10-14 ENCOUNTER — Encounter: Payer: Self-pay | Admitting: Gastroenterology

## 2022-10-14 NOTE — Progress Notes (Signed)
Hematology/Oncology Consult note [facility]  [phone] [phone]  Patient Care Team: Remi Haggard, FNP as PCP - General (Family Medicine) Telford Nab, RN as Oncology Nurse Navigator Noreene Filbert, MD as Radiation Oncologist (Radiation Oncology) Ottie Glazier, MD as Consulting Physician (Pulmonary Disease)   Name of the patient: Gina Sanchez  [id number]  Nov 01, 1965   Date of visit: 10/14/22  Diagnosis- metastatic lung cancer  Chief complaint/ Reason for visit- routine f/u of lung cancer on sotorasib  Heme/Onc history: Patient is a 57 year old female with a past medical history significant for hypertension hyperlipidemia and anxiety who presented with right thigh pain and was found to have an acute right proximal femoral shaft fracture.  She underwent operative fixation on 10/12/2020.  MRI of femur showed heterogeneously enhancing osseous lesion within the area which would be nonspecific versus office neoplasm or metastatic lesion.  CT chest abdomen and pelvis with contrast showed an enlarged pretracheal lymph node 2.2 x 1.5 cm and a right paratracheal lymph node measuring 2.7 x 1.3 cm.  Prevascular node measuring 0.3 x 0.9 cm.  5 x 4 mm right middle lobe nodule.  2.8 x 1.9 cm left adrenal lesion.    Reamings from the right femur showed metastatic poorly differentiated carcinoma.  Immunohistochemistry showed was positive for pancytokeratin, CK7 and patchy CK20 with patchy dim expression of TTF-1.  Cells negative for Melan-A, CDX2, PAX8, Napsin A, GATA3, p40, CD56, p16 and thyroglobulin.  Findings compatible with metastatic carcinoma but because of decalcification immunohistochemical staining is unreliable.  Patchy dim staining with TTF-1 suspicious for lung primary but not a definitive diagnosis.   Repeat supraclavicular lymph node biopsy showed metastatic adenocarcinoma.  Tumor cells positive for CK7 with focal weak staining for  TTF-1.  Suggestive of lung origin in the proper clinical context.  Cells were negative for GATA3 PAX8 CDX2 and CK20 and Napsin A.  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  She is currently on hydrocortisone twice daily.  Thyroid functions presently are normal     Patient was treated with Norma Fredrickson Alimta Keytruda chemotherapy first-line followed by maintenance Alimta and Keytruda.  She developed autoimmune colitis secondary to [facility] and therefore that was stopped.  She is on Entyvio and follows up with GI for her autoimmune colitis.  Most recent regimen was maintenance Alimta. Disease progression in Jan 2024. Patients started on sotorasib    Interval history- tolerating 640 mg sotorasib well. Denies any nausea vomiting. She has mild baseline diarrhea from her IBD which is stable. Headache and neck pressure is better. Pain meds have been helping  ECOG PS- 1 Pain scale- 2   Review of systems- Review of Systems  Constitutional:  Positive for malaise/fatigue. Negative for chills, fever and weight loss.  HENT:  Negative for congestion, ear discharge and nosebleeds.   Eyes:  Negative for blurred vision.  Respiratory:  Negative for cough, hemoptysis, sputum production, shortness of breath and wheezing.   Cardiovascular:  Negative for chest pain, palpitations, orthopnea and claudication.  Gastrointestinal:  Negative for abdominal pain, blood in stool, constipation, diarrhea, heartburn, melena, nausea and vomiting.  Genitourinary:  Negative for dysuria, flank pain, frequency, hematuria and urgency.  Musculoskeletal:  Negative for back pain, joint pain and myalgias.  Skin:  Negative for rash.  Neurological:  Positive for headaches. Negative for dizziness, tingling,  focal weakness, seizures and weakness.  Endo/Heme/Allergies:  Does not bruise/bleed easily.   Psychiatric/Behavioral:  Negative for depression and suicidal ideas. The patient does not have insomnia.       Allergies  Allergen Reactions   Factive [Gemifloxacin] Rash     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   Palpitations    Precordial chest pain    Wrist fracture 03/2021   left     Past Surgical History:  Procedure Laterality Date   BREAST BIOPSY Right 2017   benign   CERVICAL BIOPSY  W/ LOOP ELECTRODE EXCISION     COLONOSCOPY  06/11/2022   Procedure: COLONOSCOPY;  Surgeon: Lin Landsman, MD;  Location: [facility] ENDOSCOPY;  Service: Gastroenterology;;   COLONOSCOPY WITH PROPOFOL N/A 07/03/2015   Procedure: COLONOSCOPY WITH PROPOFOL;  Surgeon: Hulen Luster, MD;  Location: [facility] ENDOSCOPY;  Service: Gastroenterology;  Laterality: N/A;   COLONOSCOPY WITH PROPOFOL N/A 01/06/2022   Procedure: COLONOSCOPY WITH PROPOFOL;  Surgeon: Lin Landsman, MD;  Location: [facility] ENDOSCOPY;  Service: Gastroenterology;  Laterality: N/A;   ESOPHAGOGASTRODUODENOSCOPY (EGD) WITH PROPOFOL N/A 07/03/2015   Procedure: ESOPHAGOGASTRODUODENOSCOPY (EGD) WITH PROPOFOL;  Surgeon: Hulen Luster, MD;  Location: [facility] ENDOSCOPY;  Service: Gastroenterology;  Laterality: N/A;   FLEXIBLE SIGMOIDOSCOPY N/A 03/12/2022   Procedure: FLEXIBLE SIGMOIDOSCOPY;  Surgeon: Lin Landsman, MD;  Location: [facility] ENDOSCOPY;  Service: Gastroenterology;  Laterality: N/A;   FLEXIBLE SIGMOIDOSCOPY N/A 08/17/2022   Procedure: FLEXIBLE SIGMOIDOSCOPY;  Surgeon: Lin Landsman, MD;  Location[facility] ENDOSCOPY;  Service: Gastroenterology;  Laterality: N/A;   FRACTURE SURGERY     INTRAMEDULLARY (IM) NAIL INTERTROCHANTERIC Right 09/15/2020   Procedure: INTRAMEDULLARY (IM) NAIL INTERTROCHANTRIC;   Surgeon: Corky Mull, MD;  Location: [facility] ORS;  Service: Orthopedics;  Laterality: Right;   IR CV LINE INJECTION  07/14/2022   IR IMAGING GUIDED PORT INSERTION  11/28/2020   LEFT HEART CATH AND CORONARY ANGIOGRAPHY N/A 03/28/2017   Procedure: Left Heart Cath and Coronary Angiography;  Surgeon: Lorretta Harp, MD;  Location: [facility];  Service: Cardiovascular;  Laterality: N/A;   ORIF WRIST FRACTURE Left 03/31/2021   Procedure: OPEN REDUCTION INTERNAL FIXATION (ORIF) LEFT DISTAL RADIUS FRACTURE.;  Surgeon: Corky Mull, MD;  Location: [facility] ORS;  Service: Orthopedics;  Laterality: Left;    Social History   Socioeconomic History   Marital status: Married    Spouse name: Gene   Number of children: 2   Years of education: Not on file   Highest education level: Not on file  Occupational History   Not on file  Tobacco Use   Smoking status: Former    Packs/day: 1.00    Years: 30.00    Total pack years: 30.00    Types: Cigarettes    Quit date: 09/17/2020    Years since quitting: 2.0   Smokeless tobacco: Never  Vaping Use   Vaping Use: Never used  Substance and Sexual Activity   Alcohol use: Not Currently    Alcohol/week: 1.0 standard drink of alcohol    Types: 1 Standard drinks or equivalent per week    Comment: beer occ.   Drug use: No   Sexual activity:  Yes    Birth control/protection: Post-menopausal  Other Topics Concern   Not on file  Social History Narrative   Lives in [place] with her husband.  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   Social Determinants of Health   Financial Resource Strain: Not on file  Food Insecurity: Not on file  Transportation Needs: No Transportation Needs (09/20/2022)   PRAPARE - Hydrologist (Medical): No    Lack of Transportation (Non-Medical): No  Physical Activity: Not on file  Stress: Not on file  Social Connections: Not on file  Intimate Partner  Violence: Not on file    Family History  Problem Relation Age of Onset   Diabetes Mother        alive @ 45   Goiter Mother    Heart disease Father        died of MI @ 52   Osteoporosis Maternal Grandmother    Colon cancer Maternal Grandfather    Breast cancer Neg Hx    Ovarian cancer Neg Hx      Current Outpatient Medications:    acetaminophen (TYLENOL) 500 MG tablet, Take 500 mg by mouth every 6 (six) hours as needed., Disp: , Rfl:    ALPRAZolam (XANAX) 0.5 MG tablet, TAKE ONE TABLET DAILY AS NEEDED, Disp: 30 tablet, Rfl: 0   hydrocortisone (CORTEF) 10 MG tablet, TAKE 1 TABLET BY MOUTH NIGHTLY, Disp: 30 tablet, Rfl: 3   hydrocortisone (CORTEF) 20 MG tablet, TAKE 1 TABLET BY MOUTH EVERY MORNING, Disp: 30 tablet, Rfl: 3   metoprolol tartrate (LOPRESSOR) 25 MG tablet, Take 25 mg by mouth in the morning., Disp: , Rfl:    ondansetron (ZOFRAN) 4 MG tablet, TAKE 1 TABLET BY MOUTH EVERY 8 HOURS AS NEEDED FOR NAUSEA AND VOMITING, Disp: 20 tablet, Rfl: 1   oxyCODONE (OXY IR/ROXICODONE) 5 MG immediate release tablet, Take 1-2 tablets (5-10 mg total) by mouth every 4 (four) hours as needed for severe pain., Disp: 60 tablet, Rfl: 0   sotorasib (LUMAKRAS) 320 MG tablet, Take 640 mg by mouth daily., Disp: , Rfl:    valACYclovir (VALTREX) 1000 MG tablet, Take 1 tablet (1,000 mg total) by mouth 3 (three) times daily., Disp: 30 tablet, Rfl: 0   Vedolizumab (ENTYVIO IV), Inject 1 Dose into the vein as directed. Every 4 weeks at her home, Disp: , Rfl:    citalopram (CELEXA) 10 MG tablet, Take 10 mg by mouth daily. (Patient not taking: Reported on 09/15/2022), Disp: , Rfl:    oxyCODONE (OXYCONTIN) 10 mg 12 hr tablet, Take 1 tablet (10 mg total) by mouth every 12 (twelve) hours., Disp: 60 tablet, Rfl: 0 No current facility-administered medications for this visit.  Facility-Administered Medications Ordered in Other Visits:    sodium chloride flush (NS) 0.9 % injection 10 mL, 10 mL, Intravenous, PRN, Sindy Guadeloupe, MD, 10 mL at 03/09/21 0906  Physical exam:  Vitals:   10/13/22 1410 10/13/22 1412 10/13/22 1415  BP:   112/79  Pulse:  93   Resp:  18   SpO2:  98%   Weight: 192 lb (87.1 kg) 192 lb (87.1 kg)   Height: 5\' 6"  (1.676 m) 5\' 6"  (1.676 m)    Physical Exam Cardiovascular:     Rate and Rhythm: Normal rate and regular rhythm.     Heart sounds: Normal heart sounds.  Pulmonary:     Effort: Pulmonary effort is normal.  Breath sounds: Normal breath sounds.  Abdominal:     General: Bowel sounds are normal.     Palpations: Abdomen is soft.  Skin:    General: Skin is warm and dry.  Neurological:     Mental Status: She is alert and oriented to person, place, and time.         Latest Ref Rng & Units 10/13/2022    1:52 PM  CMP  Glucose 70 - 99 mg/dL 118   BUN 6 - 20 mg/dL 9   Creatinine 0.44 - 1.00 mg/dL 0.65   Sodium 135 - 145 mmol/L 137   Potassium 3.5 - 5.1 mmol/L 3.8   Chloride 98 - 111 mmol/L 106   CO2 22 - 32 mmol/L 20   Calcium 8.9 - 10.3 mg/dL 8.6   Total Protein 6.5 - 8.1 g/dL 7.2   Total Bilirubin 0.3 - 1.2 mg/dL 0.5   Alkaline Phos 38 - 126 U/L 657   AST 15 - 41 U/L 23   ALT 0 - 44 U/L 15       Latest Ref Rng & Units 10/13/2022    1:52 PM  CBC  WBC 4.0 - 10.5 K/uL 8.5   Hemoglobin 12.0 - 15.0 g/dL 11.5   Hematocrit 36.0 - 46.0 % 36.9   Platelets 150 - 400 K/uL 350     No images are attached to the encounter.  CT SOFT TISSUE NECK W CONTRAST  Result Date: 09/18/2022 CLINICAL DATA:  Provided history: Primary malignant neoplasm of right lung metastatic to other site. Vocal cord paralysis. Right lung cancer with metastases and new vocal cord paralysis EXAM: CT NECK WITH CONTRAST TECHNIQUE: Multidetector CT imaging of the neck was performed using the standard protocol following the bolus administration of intravenous contrast. RADIATION DOSE REDUCTION: This exam was performed according to the departmental dose-optimization program which includes automated  exposure control, adjustment of the mA and/or kV according to patient size and/or use of iterative reconstruction technique. CONTRAST:  142mL OMNIPAQUE IOHEXOL 300 MG/ML  SOLN COMPARISON:  Same day CT chest/abdomen/pelvis 09/16/2022. Same day head CT 09/16/2022. Same day whole body nuclear medicine bone scan 09/16/2022. FINDINGS: Pharynx and larynx: Punctate calcific foci within the bilateral palatine tonsils, which may reflect postinflammatory calcifications and/or tonsilloliths. Asymmetric prominence of the right pyriform sinus and right laryngeal ventricle, consistent with the provided history of vocal cord paralysis. Salivary glands: No inflammation, mass, or stone. Thyroid: 12 mm nodule within the right thyroid lobe, not meeting consensus criteria for ultrasound follow-up based on size. No follow-up imaging is recommended. Reference: J Am Coll Radiol. 2015 Feb;12(2): 143-50. Lymph nodes: No pathologically enlarged cervical chain lymph nodes are identified. Vascular: Partially imaged right chest infusion port catheter. The major vascular structures of the neck are patent. Atherosclerotic plaque about both carotid bifurcations. Limited intracranial: No evidence of acute intracranial abnormality within the field of view. Visualized orbits: No orbital mass or acute orbital finding. Mastoids and visualized paranasal sinuses: Mucous retention cyst within a posterior left ethmoid air cell. Mild mucosal thickening scattered elsewhere within the bilateral ethmoid sinuses. Small mucous retention cyst, and minimal background mucosal thickening, within the right maxillary sinus. Minimal mucosal thickening within the left maxillary sinus. Skeleton: Multifocal osseous metastatic disease, better delineated on same day whole-body nuclear medicine bone scan. Cervical spondylosis. Upper chest: Separately reported on same day CT chest/abdomen/pelvis. IMPRESSION: 1. Asymmetric prominence of the right pyriform sinus and right  laryngeal ventricle, consistent with the provided history of vocal cord paralysis.  2. No mass or pathologically enlarged lymph nodes identified within the neck. 3. Mild paranasal sinus disease, as described. 4. Same day head CT head, CT chest/abdomen/pelvis and whole-body nuclear medicine bone scan separately reported. Electronically Signed   By: Kellie Simmering D.O.   On: 09/18/2022 13:04   CT CHEST ABDOMEN PELVIS W CONTRAST  Result Date: 09/17/2022 CLINICAL DATA:  Metastatic right lung cancer restaging * Tracking Code: BO * EXAM: CT CHEST, ABDOMEN, AND PELVIS WITH CONTRAST TECHNIQUE: Multidetector CT imaging of the chest, abdomen and pelvis was performed following the standard protocol during bolus administration of intravenous contrast. RADIATION DOSE REDUCTION: This exam was performed according to the departmental dose-optimization program which includes automated exposure control, adjustment of the mA and/or kV according to patient size and/or use of iterative reconstruction technique. CONTRAST:  114mL OMNIPAQUE IOHEXOL 300 MG/ML  SOLN COMPARISON:  06/03/2022 FINDINGS: CT CHEST FINDINGS Cardiovascular: Right chest port catheter. Normal heart size. Coronary artery calcifications. No pericardial effusion. Mediastinum/Nodes: Similar matted appearing, enlarged high pretracheal and right superior mediastinal lymph nodes, largest measuring up to 2.5 x 2.0 cm. These appear to at least partially occlude the superior vena cava, which is effaced, with vascular collateralization about the mediastinum (series 3, image 17). Thyroid gland, trachea, and esophagus demonstrate no significant findings. Lungs/Pleura: Multiple new and enlarged bilateral pulmonary nodules, a previously seen nodule of the high superior segment left lower lobe measuring 0.6 cm, previously 0.3 cm (series 5, image 18). New nodule of the slightly inferior superior segment left lower lobe measuring 0.7 x 0.6 cm (series 5, image 24). New nodule of the  peripheral right upper lobe measuring 0.6 cm (series 5, image 31). New nodule of the superior segment right lower lobe measuring 0.4 cm (series 5, image 34). No pleural effusion or pneumothorax. Musculoskeletal: No chest wall abnormality. No acute osseous findings. CT ABDOMEN PELVIS FINDINGS Hepatobiliary: No solid liver abnormality is seen. Unchanged fluid attenuation cyst of the anterior liver dome, benign, for which no specific further follow-up or characterization is required. No gallstones, gallbladder wall thickening, or biliary dilatation. Pancreas: Unremarkable. No pancreatic ductal dilatation or surrounding inflammatory changes. Spleen: Normal in size without significant abnormality. Adrenals/Urinary Tract: New left adrenal nodule measuring 1.9 x 1.7 cm (series 3, image 63). New thickening of the right adrenal body (series 3, image 62). Kidneys are normal, without renal calculi, solid lesion, or hydronephrosis. Bladder is unremarkable. Stomach/Bowel: Stomach is within normal limits. Appendix appears normal. No evidence of bowel wall thickening, distention, or inflammatory changes. Descending and sigmoid diverticulosis. Vascular/Lymphatic: Aortic atherosclerosis. No enlarged abdominal or pelvic lymph nodes. Reproductive: No mass or other abnormality. Other: No abdominal wall hernia or abnormality. No ascites. Numerous new peritoneal and omental soft tissue nodules, for example in the ventral abdomen measuring 1.2 x 1.0 cm (series 3, image 82), and in the left paracolic gutter measuring 1.0 x 1.0 cm (series 3, image 83). Additional retroperitoneal and subcutaneous fat soft tissue nodules, for example a small soft tissue nodule of the right buttock measuring 0.4 cm (series 3, image 95). Musculoskeletal: No acute osseous findings. Status post intramedullary nail fixation of the right femur about an expansile, mixed lytic and sclerotic lesion, partially imaged (series 3, image 139). Unchanged irregular sclerotic  lesion of the left iliac wing (series 3, image 105). New sclerotic lesion of the sternal body (series 7, image 102). IMPRESSION: 1. Multiple new and enlarged bilateral pulmonary nodules. 2. Similar matted appearing, enlarged high pretracheal and right superior mediastinal lymph nodes.  These appear to at least partially occlude the superior vena cava, which is effaced, with vascular collateralization about the mediastinum. 3. New left adrenal nodule. New thickening of the right adrenal body. 4. Numerous new peritoneal and omental soft tissue nodules, as well as retroperitoneal and subcutaneous fat soft tissue nodules. 5. New sclerotic lesion of the sternal body. Unchanged, partially imaged expansile lesion of the right femoral diaphysis and sclerotic lesion of the left iliac wing. 6. Findings are consistent with new and worsened metastatic disease. 7. Coronary artery disease. Aortic Atherosclerosis (ICD10-I70.0). Electronically Signed   By: Delanna Ahmadi M.D.   On: 09/17/2022 14:09   NM Bone Scan Whole Body  Result Date: 09/16/2022 CLINICAL DATA:  RIGHT lung cancer, restaging, history of LEFT adrenal and RIGHT femoral metastases EXAM: NUCLEAR MEDICINE WHOLE BODY BONE SCAN TECHNIQUE: Whole body anterior and posterior images were obtained approximately 3 hours after intravenous injection of radiopharmaceutical. RADIOPHARMACEUTICALS:  21.54 mCi Technetium-58m MDP IV COMPARISON:  07/13/2022 Radiographic correlation: CT head chest abdomen pelvis 09/16/2022 FINDINGS: Multiple foci of abnormal osseous tracer accumulation are seen consistent with osseous metastatic disease. These include calvarium, thoracolumbar spine, pelvis, scapula, BILATERAL ribs, sternum, proximal RIGHT humerus, BILATERAL femora. Metastatic lesions have increased in size and number since previous study. IMPRESSION: Diffuse osseous metastatic disease progressive since 07/13/2022 Electronically Signed   By: Lavonia Dana M.D.   On: 09/16/2022 17:44    CT HEAD WO CONTRAST (5MM)  Result Date: 09/16/2022 CLINICAL DATA:  Metastatic right lung carcinoma, new vocal cord paralysis EXAM: CT HEAD WITHOUT CONTRAST TECHNIQUE: Contiguous axial images were obtained from the base of the skull through the vertex without intravenous contrast. RADIATION DOSE REDUCTION: This exam was performed according to the departmental dose-optimization program which includes automated exposure control, adjustment of the mA and/or kV according to patient size and/or use of iterative reconstruction technique. COMPARISON:  Previous MR brain done on 01/20/2021 FINDINGS: Brain: No acute intracranial findings are seen. There are no signs of bleeding within the cranium. Ventricles are not dilated. There is no focal edema or mass effect. Vascular: Unremarkable. Skull: Unremarkable. In the previous MRI, there was an enhancing lesion in the right posterior parietal calvarium which could not be distinctly visualized in the noncontrast CT brain. Sinuses/Orbits: There is mucosal thickening in the ethmoid sinus. Other: None. IMPRESSION: No acute intracranial findings are seen in noncontrast CT brain. Chronic ethmoid sinusitis. Electronically Signed   By: Elmer Picker M.D.   On: 09/16/2022 15:26     Assessment and plan- Patient is a 56 y.o. female with metastatic lung cancer currently on sotorasib. She is here for routine f/u visit  Patient tolerating 640 mg sotorasib well and is willing to try increased dose of 960 mg daily. She will let us know if she is unable to tolerate the hgher dose.  I am planning to get repeat scans in 1st week of April. If she does not respond to this, I will switch her to Carbo/Taxol/ avastin regimen  Neoplasm related pain: continue oxycontin and prn oxycodone  I will see her in 1 month with labs. She recently finished palliative radiation to her mediastinal LN   Visit Diagnosis 1. Encounter for monitoring zoledronic acid therapy   2. High risk  medication use   3. Primary malignant neoplasm of right lung metastatic to other site [facility])      Dr. Randa Evens, MD, MPH [facility] [phone] 10/14/2022 11:42 AM

## 2022-10-14 NOTE — Telephone Encounter (Signed)
Appeal letter/records faxed to insurance.

## 2022-10-14 NOTE — Telephone Encounter (Signed)
2nd PA process resubmitted with clinical information. Will send appeal if denied.

## 2022-10-15 ENCOUNTER — Encounter: Payer: Self-pay | Admitting: Oncology

## 2022-10-15 NOTE — Telephone Encounter (Signed)
Gina Sanchez- good news- NOTICE OF APPROVAL- [employer] FOR OXYCONTIN SENT TO HER CHART.

## 2022-10-19 DIAGNOSIS — J3801 Paralysis of vocal cords and larynx, unilateral: Secondary | ICD-10-CM | POA: Diagnosis not present

## 2022-10-19 DIAGNOSIS — R490 Dysphonia: Secondary | ICD-10-CM | POA: Diagnosis not present

## 2022-10-20 ENCOUNTER — Other Ambulatory Visit: Payer: Self-pay | Admitting: *Deleted

## 2022-10-20 MED ORDER — OXYCODONE HCL ER 10 MG PO T12A: 10.0000 mg | EXTENDED_RELEASE_TABLET | Freq: Two times a day (BID) | ORAL | 0 refills | Status: DC

## 2022-10-21 ENCOUNTER — Telehealth: Payer: Self-pay | Admitting: Gastroenterology

## 2022-10-21 ENCOUNTER — Ambulatory Visit
Admission: RE | Admit: 2022-10-21 | Discharge: 2022-10-21 | Disposition: A | Discharge status: Home / Self Care | Payer: 59 | Source: Ambulatory Visit | Attending: Radiation Oncology | Admitting: Radiation Oncology

## 2022-10-21 ENCOUNTER — Encounter: Payer: Self-pay | Admitting: Radiation Oncology

## 2022-10-21 VITALS — BP 130/92 | HR 98 | Temp 97.1°F | Resp 16 | Ht 66.0 in | Wt 192.0 lb

## 2022-10-21 DIAGNOSIS — C3490 Malignant neoplasm of unspecified part of unspecified bronchus or lung: Secondary | ICD-10-CM | POA: Diagnosis not present

## 2022-10-21 DIAGNOSIS — C7951 Secondary malignant neoplasm of bone: Secondary | ICD-10-CM | POA: Insufficient documentation

## 2022-10-21 NOTE — Telephone Encounter (Signed)
Called and got patient moved to when the provider is on call on 10/26/2022 at 2:00pm.

## 2022-10-21 NOTE — Progress Notes (Signed)
Radiation Oncology Follow up Note  Name: Gina Sanchez   Date:   10/21/2022 MRN:  [id number] DOB: April 04, 1966    This 57 y.o. 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  REFERRING PROVIDER: Remi Haggard, FNP  HPI: Patient is a 57 year old female previously treated both to her right femur for metastatic involvement stage IV adenocarcinoma 2 years prior as well as recently completed short palliative course of radiation therapy to her right chest for early SVC.  She is currently on.sotorasib which appears to be tolerating well.  She is having no dysphagia cough hemoptysis chest tightness.  Still has some slight facial swelling although appears to be declining.  COMPLICATIONS OF TREATMENT: none  FOLLOW UP COMPLIANCE: keeps appointments   PHYSICAL EXAM:  BP (!) 130/92 (BP Location: Right Wrist, Patient Position: Sitting, Cuff Size: Small)   Pulse 98   Temp (!) 97.1 F (36.2 C) (Tympanic)   Resp 16   Ht 5\' 6"  (1.676 m)   Wt 192 lb (87.1 kg)   LMP 09/15/2018   BMI 30.99 kg/m  Patient has facial plethora more in her neck region although this appears to be improving.  No venous jugular distention.  Well-developed well-nourished patient in NAD. HEENT reveals PERLA, EOMI, discs not visualized.  Oral cavity is clear. No oral mucosal lesions are identified. Neck is clear without evidence of cervical or supraclavicular adenopathy. Lungs are clear to A&P. Cardiac examination is essentially unremarkable with regular rate and rhythm without murmur rub or thrill. Abdomen is benign with no organomegaly or masses noted. Motor sensory and DTR levels are equal and symmetric in the upper and lower extremities. Cranial nerves II through XII are grossly intact. Proprioception is intact. No peripheral adenopathy or edema is identified. No motor or sensory levels are noted. Crude visual  fields are within normal range.  RADIOLOGY RESULTS: No current films for review  PLAN: At the present time she continues on therapy under medical oncology's direction.  At this time I do not feel she needs any further palliative radiation therapy to her chest.  She continues on a K-ras inhibitor.  Will see her back in 3 to 4 months after she has additional imaging.  We happy to reevaluate the patient anytime should further palliative treatment be indicated.  I would like to take this opportunity to thank you for allowing me to participate in the care of your patient.Noreene Filbert, MD

## 2022-10-21 NOTE — Telephone Encounter (Signed)
Patient called to reschedule her appointment. Had to schedule her out to April 23rd at 3:30.

## 2022-10-21 NOTE — Telephone Encounter (Signed)
She stated to Gina Sanchez she had another appointment she had to be out. Can I double book her some where in February. Her entyvio PA expires 02/29 and I need a recent office visit for the to approval her.

## 2022-10-25 ENCOUNTER — Ambulatory Visit: Payer: 59 | Admitting: Gastroenterology

## 2022-10-26 ENCOUNTER — Telehealth: Payer: Self-pay

## 2022-10-26 ENCOUNTER — Encounter: Payer: Self-pay | Admitting: Gastroenterology

## 2022-10-26 ENCOUNTER — Ambulatory Visit: Payer: 59 | Admitting: Gastroenterology

## 2022-10-26 VITALS — BP 118/87 | HR 90 | Temp 98.7°F | Ht 66.0 in | Wt 188.5 lb

## 2022-10-26 DIAGNOSIS — K523 Indeterminate colitis: Secondary | ICD-10-CM

## 2022-10-26 MED ORDER — DICYCLOMINE HCL 10 MG PO CAPS: 10.0000 mg | ORAL_CAPSULE | Freq: Three times a day (TID) | ORAL | 0 refills | Status: DC | PRN

## 2022-10-26 NOTE — Telephone Encounter (Signed)
Submitted PA through cover my meds for Banophen 25mg  waiting on response from insurance company

## 2022-10-26 NOTE — Progress Notes (Addendum)
Cephas Darby, MD [address]  [place]  [place], [place] [address]  Main: [phone]  Fax: [phone]    Gastroenterology Consultation  Referring Provider:     Remi Haggard, FNP Primary Care Physician:  Remi Haggard, FNP Primary Gastroenterologist:  Dr. Cephas Darby Reason for Consultation: Immune mediated colitis        HPI:   Gina Sanchez is a 57 y.o. 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 The patient has a history of metastatic lung cancer with bone, lymph, adrenal metastasis.  Follows with Dr. Janese Banks in oncology.  Commenced on Keytruda on 01/01/2022.  Patient developed severe bloody diarrhea, found to have moderate to severe acute pancolitis as well as C. difficile infection.  Patient was treated for C. difficile infection.  Her diarrhea persisted, commenced on prednisone then transitioned to [facility] when she was diagnosed with immune mediated colitis.  Beryle Flock has been discontinued.  She had recurrent C. difficile infections, treated with vancomycin and Dificid.  She was on Entyvio every 8 weeks until December 2023, biopsies revealed chronic colitis with moderate activity, therefore Entyvio has been increased to every 4 weeks.  Patient reports having 1-2 formed bowel movements daily, describes on Bristol stool scale as 4 and occasionally 6.  She does report occasional urgency.  Her weight has been stable.  Denies any rectal bleeding.  She is overall pleased with improvement in her GI symptoms.  Patient is accompanied by her husband today.  Patient is currently treated for metastatic lung cancer with sotorasib.  NSAIDs: None  Antiplts/Anticoagulants/Anti thrombotics: None  GI Procedures:  Colonoscopy 08/17/2022 DIAGNOSIS:  A. COLON, RIGHT; COLD BIOPSY:  - CHRONIC COLITIS WITH MODERATE ACTIVITY (CRYPTITIS AND CRYPT  ABSCESSES).  - NEGATIVE FOR GRANULOMA, DYSPLASIA, AND MALIGNANCY.   B.  COLON, LEFT; COLD BIOPSY:  - CHRONIC COLITIS WITH MODERATE ACTIVITY (CRYPTITIS AND CRYPT  ABSCESSES).  - NEGATIVE FOR GRANULOMA, DYSPLASIA, AND MALIGNANCY.   Colonoscopy 06/11/2022 DIAGNOSIS: A.  COLON, CECUM AND ASCENDING; COLD BIOPSY: - TUBULAR ADENOMA (1). - PATCHY MILD CHRONIC ACTIVE COLITIS, SEE COMMENT. - NEGATIVE FOR HIGH-GRADE DYSPLASIA AND MALIGNANCY.  B.  COLON, TRANSVERSE; COLD BIOPSY: - MODERATE CHRONIC ACTIVE COLITIS WITH INCREASED APOPTOTIC CRYPT DEBRIS, SEE COMMENT. - IHC FOR CMV IS NEGATIVE. - NEGATIVE FOR GRANULOMAS, DYSPLASIA, AND MALIGNANCY.  C.  COLON, DESCENDING; COLD BIOPSY: - MODERATE TO MARKED CHRONIC ACTIVE COLITIS WITH INCREASED APOPTOTIC CRYPT DEBRIS, SEE COMMENT. - IHC FOR CMV IS NEGATIVE. - NEGATIVE FOR GRANULOMAS, DYSPLASIA, AND MALIGNANCY.  D.  COLON, SIGMOID; COLD BIOPSY: - MODERATE TO MARKED CHRONIC ACTIVE COLITIS WITH INCREASED APOPTOTIC CRYPT DEBRIS, SEE COMMENT. - IHC FOR CMV IS NEGATIVE. - NEGATIVE FOR GRANULOMAS, DYSPLASIA, AND MALIGNANCY.    Colonoscopy 01/06/2022 - Segmental moderate inflammation was found in the rectum, in the recto-sigmoid colon, in the sigmoid colon, in the ascending colon and in the cecum secondary to colitis. - The distal rectum and anal verge are normal on retroflexion view. - Diverticulosis in the entire examined colon.  DIAGNOSIS:  A.  TERMINAL ILEUM; BIOPSIES:  - SMALL BOWEL MUCOSA WITH NORMAL VILLOUS ARCHITECTURE AND SCATTERED MILD  LYMPHOID HYPERPLASIA.  - NO EVIDENCE OF SIGNIFICANT ATYPIA.   B.  COLON, ASCENDING; BIOPSIES;  - MODERATE ACUTE COLITIS (SEE COMMENT)   C.  COLON, TRANSVERSE, BIOPSIES;  - FOCAL SEVERE ACUTE COLITIS (SEE COMMENT).   D.  COLON, DESCENDING; BIOPSIES:  COLONIC MUCOSA WITH  NO SPECIFIC HISTOLOGIC ABNORMALITY.   E.  COLON, SIGMOID; BIOPSIES:  - COLONIC MUCOSA WITH MODERATE TO SEVERE ACUTE COLITIS (SEE COMMENT).   F.  RECTUM; BIOPSIES:  - DIFFUSE SEVERE ACUTE COLITIS (SEE  COMMENT).  Comment: Biopsies from the ascending transverse sigmoid and rectum show  diffuse increased acute and chronic inflammation with numerous areas of  cryptitis and crypt abscess.  There is miniaturization of crypts.  Significant complex architectural branching is not seen.  There is  marked reactive nuclear change but no definite dysplasia. There are  occasional cells with prominent eosinophilic nucleoli. The pattern  present is nonspecific but has features that have been associated with  Keytruda induced colitis (or other medication effects).  An acute  ischemic colitis would also be in the differential.  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   Palpitations    Precordial chest pain    Wrist fracture 03/2021   left    Past Surgical History:  Procedure Laterality Date   BREAST BIOPSY Right 2017   benign   CERVICAL BIOPSY  W/ LOOP ELECTRODE EXCISION     COLONOSCOPY  06/11/2022   Procedure: COLONOSCOPY;  Surgeon: Lin Landsman, MD;  Location: [facility] ENDOSCOPY;  Service: Gastroenterology;;   COLONOSCOPY WITH PROPOFOL N/A 07/03/2015   Procedure: COLONOSCOPY WITH PROPOFOL;  Surgeon: Hulen Luster, MD;  Location: [facility] ENDOSCOPY;  Service: Gastroenterology;  Laterality: N/A;   COLONOSCOPY WITH PROPOFOL N/A 01/06/2022   Procedure: COLONOSCOPY WITH PROPOFOL;  Surgeon: Lin Landsman, MD;  Location: [facility] ENDOSCOPY;  Service: Gastroenterology;  Laterality: N/A;   ESOPHAGOGASTRODUODENOSCOPY (EGD) WITH PROPOFOL N/A 07/03/2015   Procedure: ESOPHAGOGASTRODUODENOSCOPY (EGD) WITH PROPOFOL;  Surgeon: Hulen Luster, MD;  Location: [facility] ENDOSCOPY;  Service: Gastroenterology;  Laterality: N/A;   FLEXIBLE SIGMOIDOSCOPY N/A 03/12/2022   Procedure: FLEXIBLE  SIGMOIDOSCOPY;  Surgeon: Lin Landsman, MD;  Location: [facility] ENDOSCOPY;  Service: Gastroenterology;  Laterality: N/A;   FLEXIBLE SIGMOIDOSCOPY N/A 08/17/2022   Procedure: FLEXIBLE SIGMOIDOSCOPY;  Surgeon: Lin Landsman, MD;  Location: [facility] ENDOSCOPY;  Service: Gastroenterology;  Laterality: N/A;   FRACTURE SURGERY     INTRAMEDULLARY (IM) NAIL INTERTROCHANTERIC Right 09/15/2020   Procedure: INTRAMEDULLARY (IM) NAIL INTERTROCHANTRIC;  Surgeon: Corky Mull, MD;  Location: [facility] ORS;  Service: Orthopedics;  Laterality: Right;   IR CV LINE INJECTION  07/14/2022   IR IMAGING GUIDED PORT INSERTION  11/28/2020   LEFT HEART CATH AND CORONARY ANGIOGRAPHY N/A 03/28/2017   Procedure: Left Heart Cath and Coronary Angiography;  Surgeon: Lorretta Harp, MD;  Location: [place] CV LAB;  Service: Cardiovascular;  Laterality: N/A;   ORIF WRIST FRACTURE Left 03/31/2021   Procedure: OPEN REDUCTION INTERNAL FIXATION (ORIF) LEFT DISTAL RADIUS FRACTURE.;  Surgeon: Corky Mull, MD;  Location: [facility] ORS;  Service: Orthopedics;  Laterality: Left;   Current Outpatient Medications:    acetaminophen (TYLENOL) 500 MG tablet, Take 500 mg by mouth every 6 (six) hours as needed., Disp: , Rfl:    ALPRAZolam (XANAX) 0.5 MG tablet, TAKE ONE TABLET DAILY AS NEEDED, Disp: 30 tablet, Rfl: 0   citalopram (CELEXA) 10 MG tablet, Take 10 mg by mouth daily., Disp: , Rfl:    dicyclomine (  BENTYL) 10 MG capsule, Take 1 capsule (10 mg total) by mouth every 8 (eight) hours as needed for spasms., Disp: 30 capsule, Rfl: 0   hydrocortisone (CORTEF) 10 MG tablet, TAKE 1 TABLET BY MOUTH NIGHTLY, Disp: 30 tablet, Rfl: 3   hydrocortisone (CORTEF) 20 MG tablet, TAKE 1 TABLET BY MOUTH EVERY MORNING, Disp: 30 tablet, Rfl: 3   metoprolol tartrate (LOPRESSOR) 25 MG tablet, Take 25 mg by mouth in the morning., Disp: , Rfl:    ondansetron (ZOFRAN) 4 MG tablet, TAKE 1 TABLET BY MOUTH EVERY 8 HOURS AS NEEDED FOR NAUSEA AND VOMITING, Disp: 20  tablet, Rfl: 1   oxyCODONE (OXY IR/ROXICODONE) 5 MG immediate release tablet, Take 1-2 tablets (5-10 mg total) by mouth every 4 (four) hours as needed for severe pain., Disp: 60 tablet, Rfl: 0   oxyCODONE (OXYCONTIN) 10 mg 12 hr tablet, Take 1 tablet (10 mg total) by mouth every 12 (twelve) hours., Disp: 60 tablet, Rfl: 0   sotorasib (LUMAKRAS) 320 MG tablet, Take 640 mg by mouth daily., Disp: , Rfl:    valACYclovir (VALTREX) 1000 MG tablet, Take 1 tablet (1,000 mg total) by mouth 3 (three) times daily., Disp: 30 tablet, Rfl: 0   Vedolizumab (ENTYVIO IV), Inject 1 Dose into the vein as directed. Every 4 weeks at her home, Disp: , Rfl:  No current facility-administered medications for this visit.  Facility-Administered Medications Ordered in Other Visits:    sodium chloride flush (NS) 0.9 % injection 10 mL, 10 mL, Intravenous, PRN, Sindy Guadeloupe, MD, 10 mL at 03/09/21 0906    Family History  Problem Relation Age of Onset   Diabetes Mother        alive @ 75   Goiter Mother    Heart disease Father        died of MI @ 49   Osteoporosis Maternal Grandmother    Colon cancer Maternal Grandfather    Breast cancer Neg Hx    Ovarian cancer Neg Hx      Social History   Tobacco Use   Smoking status: Former    Packs/day: 1.00    Years: 30.00    Total pack years: 30.00    Types: Cigarettes    Quit date: 09/17/2020    Years since quitting: 2.1   Smokeless tobacco: Never  Vaping Use   Vaping Use: Never used  Substance Use Topics   Alcohol use: Not Currently    Alcohol/week: 1.0 standard drink of alcohol    Types: 1 Standard drinks or equivalent per week    Comment: beer occ.   Drug use: No    Allergies as of 10/26/2022 - Review Complete 10/26/2022  Allergen Reaction Noted   Factive [gemifloxacin] Rash 07/03/2015    Review of Systems:    All systems reviewed and negative except where noted in HPI.   Physical Exam:  BP 118/87 (BP Location: Left Arm, Patient Position: Sitting,  Cuff Size: Normal)   Pulse 90   Temp 98.7 F (37.1 C) (Oral)   Ht 5\' 6"  (1.676 m)   Wt 188 lb 8 oz (85.5 kg)   LMP 09/15/2018   BMI 30.42 kg/m  Patient's last menstrual period was 09/15/2018.  General:   Alert,  Well-developed, well-nourished, pleasant and cooperative in NAD Head:  Normocephalic and atraumatic. Eyes:  Sclera clear, no icterus.   Conjunctiva pink. Ears:  Normal auditory acuity. Nose:  No deformity, discharge, or lesions. Mouth:  No deformity or lesions,oropharynx pink & moist.  Neck:  Supple; no masses or thyromegaly. Lungs:  Respirations even and unlabored.  Clear throughout to auscultation.   No wheezes, crackles, or rhonchi. No acute distress. Heart:  Regular rate and rhythm; no murmurs, clicks, rubs, or gallops. Abdomen:  Normal bowel sounds. Soft, non-tender and non-distended without masses, hepatosplenomegaly or hernias noted.  No guarding or rebound tenderness.   Rectal: Not performed Msk:  Symmetrical without gross deformities. Good, equal movement & strength bilaterally. Pulses:  Normal pulses noted. Extremities:  No clubbing or edema.  No cyanosis. Neurologic:  Alert and oriented x3;  grossly normal neurologically. Skin:  Intact without significant lesions or rashes. No jaundice. Psych:  Alert and cooperative. Normal mood and affect.  Imaging Studies: Reviewed  Assessment and Plan:   ROSSELYN MARTHA is a 57 y.o. pleasant Caucasian female with history of metastatic adenocarcinoma of the lung, previously on Keytruda which has been discontinued due to immune mediated colitis in 02/2022.  She also had recurrent C. difficile infection treated with vancomycin as well as Dificid.  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

## 2022-10-27 ENCOUNTER — Ambulatory Visit: Payer: 59

## 2022-10-27 ENCOUNTER — Inpatient Hospital Stay: Payer: 59 | Attending: Oncology

## 2022-10-27 ENCOUNTER — Ambulatory Visit: Payer: 59 | Admitting: Oncology

## 2022-10-27 ENCOUNTER — Inpatient Hospital Stay: Payer: 59

## 2022-10-27 ENCOUNTER — Other Ambulatory Visit: Payer: 59

## 2022-10-27 DIAGNOSIS — G893 Neoplasm related pain (acute) (chronic): Secondary | ICD-10-CM | POA: Insufficient documentation

## 2022-10-27 DIAGNOSIS — C7951 Secondary malignant neoplasm of bone: Secondary | ICD-10-CM | POA: Insufficient documentation

## 2022-10-27 DIAGNOSIS — Z79899 Other long term (current) drug therapy: Secondary | ICD-10-CM | POA: Insufficient documentation

## 2022-10-27 DIAGNOSIS — C342 Malignant neoplasm of middle lobe, bronchus or lung: Secondary | ICD-10-CM | POA: Insufficient documentation

## 2022-10-27 DIAGNOSIS — Z87891 Personal history of nicotine dependence: Secondary | ICD-10-CM | POA: Insufficient documentation

## 2022-10-27 DIAGNOSIS — I10 Essential (primary) hypertension: Secondary | ICD-10-CM | POA: Insufficient documentation

## 2022-10-27 NOTE — Telephone Encounter (Signed)
Insurance company denied medication and they want her to try diphenhydramine HCL oral Elixir 12.5mg /64ml, Hydroxyzine, or Levocetirizine

## 2022-10-28 DIAGNOSIS — N390 Urinary tract infection, site not specified: Secondary | ICD-10-CM | POA: Diagnosis not present

## 2022-11-01 ENCOUNTER — Telehealth: Payer: Self-pay

## 2022-11-01 NOTE — Telephone Encounter (Signed)
[employer] [facility] special[facility] Denied the Entyvio 300mg  they said drug is not covered plan exclusion. We can do a appeal on the medication by urgent to fax number [phone]

## 2022-11-01 NOTE — Telephone Encounter (Signed)
Per Website they will cover [place]

## 2022-11-01 NOTE — Telephone Encounter (Signed)
Submitted PA form and faxed to [employer] for [place] every 4 weeks. Attached last colonoscopy, flexible sigmoidoscopy reports, path reports, labs and last office visit.  I also wrote on cover sheet asking if then could extend the PA for longer then 1 month.

## 2022-11-02 ENCOUNTER — Encounter: Payer: Self-pay | Admitting: Gastroenterology

## 2022-11-02 NOTE — Telephone Encounter (Signed)
Got confirmation fax went through

## 2022-11-02 NOTE — Telephone Encounter (Signed)
Faxed appeal letter to [facility] Urgent appeal department.

## 2022-11-02 NOTE — Telephone Encounter (Signed)
Letter sent  RV

## 2022-11-04 ENCOUNTER — Inpatient Hospital Stay (HOSPITAL_BASED_OUTPATIENT_CLINIC_OR_DEPARTMENT_OTHER): Payer: 59 | Admitting: Hospice and Palliative Medicine

## 2022-11-04 DIAGNOSIS — C3491 Malignant neoplasm of unspecified part of right bronchus or lung: Secondary | ICD-10-CM

## 2022-11-04 DIAGNOSIS — G893 Neoplasm related pain (acute) (chronic): Secondary | ICD-10-CM | POA: Diagnosis not present

## 2022-11-04 DIAGNOSIS — Z515 Encounter for palliative care: Secondary | ICD-10-CM

## 2022-11-04 NOTE — Progress Notes (Signed)
Virtual Visit via Telephone Note  I connected with Gina Sanchez on 11/04/22 at  3:20 PM EST by telephone and verified that I am speaking with the correct person using two identifiers.  Location: Patient: Home Provider: Clinic   I discussed the limitations, risks, security and privacy concerns of performing an evaluation and management service by telephone and the availability of in person appointments. I also discussed with the patient that there may be a patient responsible charge related to this service. The patient expressed understanding and agreed to proceed.   History of Present Illness: Gina Sanchez is a 57 y.o. 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      Observations/Objective: I spoke with patient by phone.  Patient reports she is doing well.  She denies any changes or concerns.  She reports that pain is well-controlled on OxyContin 10 mg every 12 hours.  She says that she seldom now requires oxycodone IR for breakthrough pain.  She reports she is tolerating pain medications well and denies any adverse effects.  Assessment and Plan: Neoplasm related pain -continue OxyContin/oxycodone IR.  Daily bowel regimen   Follow Up Instructions: Follow-up telephone visit 1 to 2 months   I discussed the assessment and treatment plan with the patient. The patient was provided an opportunity to ask questions and all were answered. The patient agreed with the plan and demonstrated an understanding of the instructions.   The patient was advised to call back or seek an in-person evaluation if the symptoms worsen or if the condition fails to improve as anticipated.  I provided 5 minutes of non-face-to-face time during this encounter.   Irean Hong, NP

## 2022-11-05 ENCOUNTER — Telehealth: Payer: Self-pay

## 2022-11-05 NOTE — Telephone Encounter (Signed)
The pa on entyvio that was submitted on 11/01/2022 was process under pharmacy and not medical.  They said to refax the PA to them and mark on the cover sheet medical. Did this and they will get the PA process

## 2022-11-08 ENCOUNTER — Other Ambulatory Visit: Payer: Self-pay | Admitting: Oncology

## 2022-11-08 DIAGNOSIS — C3491 Malignant neoplasm of unspecified part of right bronchus or lung: Secondary | ICD-10-CM

## 2022-11-08 NOTE — Telephone Encounter (Signed)
CBC with Differential Order: [id number] Status: Final result     Visible to patient: Yes (not seen)     Next appt: 11/10/2022 at 01:45 PM in Oncology ([facility])     Dx: Primary malignant neoplasm of right l...   0 Result Notes           Component Ref Range & Units 3 wk ago (10/13/22) 1 mo ago (09/23/22) 1 mo ago (09/15/22) 2 mo ago (08/25/22) 3 mo ago (07/30/22) 4 mo ago (07/07/22) 4 mo ago (06/16/22)  WBC 4.0 - 10.5 K/uL 8.5 4.1 12.3 High  9.6 11.0 High  11.7 High  10.7 High   RBC 3.87 - 5.11 MIL/uL 4.37 4.27 4.50 4.18 4.35 4.18 4.34  Hemoglobin 12.0 - 15.0 g/dL 11.5 Low  11.3 Low  11.8 Low  11.1 Low  11.7 Low  11.5 Low  12.1  HCT 36.0 - 46.0 % 36.9 34.7 Low  36.9 34.5 Low  36.8 36.3 37.1  MCV 80.0 - 100.0 fL 84.4 81.3 82.0 82.5 84.6 86.8 85.5  MCH 26.0 - 34.0 pg 26.3 26.5 26.2 26.6 26.9 27.5 27.9  MCHC 30.0 - 36.0 g/dL 31.2 32.6 32.0 32.2 31.8 31.7 32.6  RDW 11.5 - 15.5 % 18.6 High  17.2 High  17.6 High  15.7 High  15.4 14.8 14.4  Platelets 150 - 400 K/uL 350 175 444 High  328 415 High  438 High  387  nRBC 0.0 - 0.2 % 0.0 0.0 CM 0.0 0.0 0.0 0.0 0.0  Neutrophils Relative % % 81  83 60 62 49 55  Neutro Abs 1.7 - 7.7 K/uL 7.0  10.1 High  5.9 7.0 5.9 5.9  Lymphocytes Relative % '11  13 27 26 '$ 37 34  Lymphs Abs 0.7 - 4.0 K/uL 0.9  1.6 2.6 2.8 4.3 High  3.7  Monocytes Relative % '5  3 8 8 8 7  '$ Monocytes Absolute 0.1 - 1.0 K/uL 0.4  0.4 0.8 0.9 0.9 0.8  Eosinophils Relative % 1  0 '3 2 3 2  '$ Eosinophils Absolute 0.0 - 0.5 K/uL 0.1  0.0 0.3 0.2 0.3 0.2  Basophils Relative % 1  0 '1 1 1 1  '$ Basophils Absolute 0.0 - 0.1 K/uL 0.1  0.0 0.1 0.1 0.1 0.1  Immature Granulocytes % '1  1 1 1 2 1  '$ Abs Immature Granulocytes 0.00 - 0.07 K/uL 0.04  0.13 High  CM 0.09 High  CM 0.10 High  CM 0.18 High  CM 0.11 High  CM  Comment: Performed at [facility], [address] [place], [place] [address]  Resulting Agency  [facility] [place] [facility] [place] [facility] [place] [facility] [place] [facility] [place] [facility] [place] [facility]          Specimen Collected: 10/13/22 13:52 Last Resulted: 10/13/22 14:14      Lab Flowsheet      Order Details      View Encounter      Lab and Collection Details      Routing      Result History    View All Conversations on this Encounter      CM=Additional comments      Result Care Coordination   Patient Communication   Add Comments   Add Notifications  Back to Top      Other Results from 10/13/2022   Contains abnormal data Comprehensive metabolic panel Order: [id number] Status: Final result      Visible to patient:  Yes (not seen)      Next appt: 11/10/2022 at 01:45 PM in Oncology ([facility])      Dx: Primary malignant neoplasm of right l...    0 Result Notes             Component Ref Range & Units 3 wk ago (10/13/22) 1 mo ago (09/15/22) 2 mo ago (08/25/22) 3 mo ago (07/30/22) 4 mo ago (07/07/22) 4 mo ago (06/16/22) 5 mo ago (06/08/22)  Sodium 135 - 145 mmol/L 137 135 137 136 138 139 138  Potassium 3.5 - 5.1 mmol/L 3.8 4.0 3.3 Low  3.5 3.7 3.3 Low  3.5  Chloride 98 - 111 mmol/L 106 101 104 105 109 107 112 High   CO2 22 - 32 mmol/L 20 Low  '23 24 23 23 26 21 '$ Low   Glucose, Bld 70 - 99 mg/dL 118 High  140 High  CM 119 High  CM 130 High  CM 124 High  CM 119 High  CM 142 High  CM  Comment: Glucose reference range applies only to samples taken after fasting for at least 8 hours.  BUN 6 - 20 mg/dL '9 18 11 10 12 8 9  '$ Creatinine, Ser 0.44 - 1.00 mg/dL 0.65 0.66 0.73 0.74 0.75 0.61 0.72  Calcium 8.9 - 10.3 mg/dL 8.6 Low  8.2 Low  8.3 Low  8.6 Low  8.3 Low  8.4 Low  8.5 Low   Total Protein 6.5 - 8.1 g/dL 7.2 7.4 6.8 6.5 6.3 Low  6.0 Low  6.3 Low   Albumin 3.5 - 5.0 g/dL 3.6 3.5 3.1 Low  3.2 Low  3.0 Low  3.0 Low  3.1 Low   AST 15 - 41 U/L '23 18 18 21 17 20 24  '$ ALT 0 - 44 U/L '15 19 14 13 15 16 23  '$ Alkaline Phosphatase 38 - 126 U/L 657 High  238 High  153 High  85 75 70 79  Total Bilirubin 0.3 - 1.2 mg/dL 0.5 0.8 0.5 0.5 0.3 0.6 0.4  GFR, Estimated >60 mL/min  >60 >60 CM >60 CM >60 CM >60 CM >60 CM >60 CM  Comment: (NOTE) Calculated using the CKD-EPI Creatinine Equation (2021)  Anion gap 5 - '15 11 11 '$ CM 9 CM 8 CM 6 CM 6 CM 5 CM  Comment: Performed at [facility], [address] [place], [place] [address]  Resulting Agency  [facility] [place] [facility] [place] [facility] [place] [facility] [place] [facility] [place] [facility] [place] [facility]         Specimen Collected: 10/13/22 13:52 Last Resulted: 10/13/22 14:24

## 2022-11-10 ENCOUNTER — Inpatient Hospital Stay (HOSPITAL_BASED_OUTPATIENT_CLINIC_OR_DEPARTMENT_OTHER): Payer: 59 | Admitting: Oncology

## 2022-11-10 ENCOUNTER — Encounter: Payer: Self-pay | Admitting: Oncology

## 2022-11-10 ENCOUNTER — Inpatient Hospital Stay: Payer: 59

## 2022-11-10 VITALS — BP 118/83 | HR 85 | Temp 95.9°F | Ht 66.0 in | Wt 189.8 lb

## 2022-11-10 DIAGNOSIS — Z79899 Other long term (current) drug therapy: Secondary | ICD-10-CM

## 2022-11-10 DIAGNOSIS — C3491 Malignant neoplasm of unspecified part of right bronchus or lung: Secondary | ICD-10-CM

## 2022-11-10 DIAGNOSIS — Z5181 Encounter for therapeutic drug level monitoring: Secondary | ICD-10-CM

## 2022-11-10 DIAGNOSIS — Z7983 Long term (current) use of bisphosphonates: Secondary | ICD-10-CM

## 2022-11-10 DIAGNOSIS — C7951 Secondary malignant neoplasm of bone: Secondary | ICD-10-CM | POA: Diagnosis not present

## 2022-11-10 DIAGNOSIS — C342 Malignant neoplasm of middle lobe, bronchus or lung: Secondary | ICD-10-CM | POA: Diagnosis not present

## 2022-11-10 DIAGNOSIS — G893 Neoplasm related pain (acute) (chronic): Secondary | ICD-10-CM | POA: Diagnosis not present

## 2022-11-10 DIAGNOSIS — I10 Essential (primary) hypertension: Secondary | ICD-10-CM | POA: Diagnosis not present

## 2022-11-10 DIAGNOSIS — Z87891 Personal history of nicotine dependence: Secondary | ICD-10-CM | POA: Diagnosis not present

## 2022-11-10 DIAGNOSIS — Z95828 Presence of other vascular implants and grafts: Secondary | ICD-10-CM

## 2022-11-10 LAB — CBC WITH DIFFERENTIAL/PLATELET
Abs Immature Granulocytes: 0.04 10*3/uL (ref 0.00–0.07)
Basophils Absolute: 0.1 10*3/uL (ref 0.0–0.1)
Basophils Relative: 1 %
Eosinophils Absolute: 0.2 10*3/uL (ref 0.0–0.5)
Eosinophils Relative: 2 %
HCT: 38 % (ref 36.0–46.0)
Hemoglobin: 11.7 g/dL — ABNORMAL LOW (ref 12.0–15.0)
Immature Granulocytes: 1 %
Lymphocytes Relative: 13 %
Lymphs Abs: 1.1 10*3/uL (ref 0.7–4.0)
MCH: 26.1 pg (ref 26.0–34.0)
MCHC: 30.8 g/dL (ref 30.0–36.0)
MCV: 84.6 fL (ref 80.0–100.0)
Monocytes Absolute: 0.6 10*3/uL (ref 0.1–1.0)
Monocytes Relative: 7 %
Neutro Abs: 6.7 10*3/uL (ref 1.7–7.7)
Neutrophils Relative %: 76 %
Platelets: 306 10*3/uL (ref 150–400)
RBC: 4.49 MIL/uL (ref 3.87–5.11)
RDW: 15.9 % — ABNORMAL HIGH (ref 11.5–15.5)
WBC: 8.8 10*3/uL (ref 4.0–10.5)
nRBC: 0 % (ref 0.0–0.2)

## 2022-11-10 LAB — COMPREHENSIVE METABOLIC PANEL
ALT: 16 U/L (ref 0–44)
AST: 23 U/L (ref 15–41)
Albumin: 3.4 g/dL — ABNORMAL LOW (ref 3.5–5.0)
Alkaline Phosphatase: 319 U/L — ABNORMAL HIGH (ref 38–126)
Anion gap: 9 (ref 5–15)
BUN: 11 mg/dL (ref 6–20)
CO2: 23 mmol/L (ref 22–32)
Calcium: 8.8 mg/dL — ABNORMAL LOW (ref 8.9–10.3)
Chloride: 105 mmol/L (ref 98–111)
Creatinine, Ser: 0.6 mg/dL (ref 0.44–1.00)
GFR, Estimated: >60 mL/min (ref >60)
Glucose, Bld: 98 mg/dL (ref 70–99)
Potassium: 4.2 mmol/L (ref 3.5–5.1)
Sodium: 137 mmol/L (ref 135–145)
Total Bilirubin: 0.3 mg/dL (ref 0.3–1.2)
Total Protein: 7.3 g/dL (ref 6.5–8.1)

## 2022-11-10 MED ORDER — SODIUM CHLORIDE 0.9% FLUSH
10.0000 mL | Freq: Once | INTRAVENOUS | Status: AC
  Administered 2022-11-10: 10 mL via INTRAVENOUS
  Filled 2022-11-10: qty 10

## 2022-11-10 MED ORDER — HEPARIN SOD (PORK) LOCK FLUSH 100 UNIT/ML IV SOLN
500.0000 [IU] | Freq: Once | INTRAVENOUS | Status: AC
  Administered 2022-11-10: 500 [IU] via INTRAVENOUS
  Filled 2022-11-10: qty 5

## 2022-11-10 NOTE — Progress Notes (Signed)
No concerns for the provider today.

## 2022-11-11 ENCOUNTER — Ambulatory Visit: Payer: 59

## 2022-11-11 ENCOUNTER — Inpatient Hospital Stay: Payer: 59

## 2022-11-11 ENCOUNTER — Encounter: Payer: Self-pay | Admitting: Oncology

## 2022-11-11 VITALS — BP 115/82 | HR 86 | Temp 97.0°F | Resp 16

## 2022-11-11 DIAGNOSIS — C342 Malignant neoplasm of middle lobe, bronchus or lung: Secondary | ICD-10-CM | POA: Diagnosis not present

## 2022-11-11 DIAGNOSIS — Z79899 Other long term (current) drug therapy: Secondary | ICD-10-CM | POA: Diagnosis not present

## 2022-11-11 DIAGNOSIS — C3491 Malignant neoplasm of unspecified part of right bronchus or lung: Secondary | ICD-10-CM

## 2022-11-11 DIAGNOSIS — Z87891 Personal history of nicotine dependence: Secondary | ICD-10-CM | POA: Diagnosis not present

## 2022-11-11 DIAGNOSIS — G893 Neoplasm related pain (acute) (chronic): Secondary | ICD-10-CM | POA: Diagnosis not present

## 2022-11-11 DIAGNOSIS — I10 Essential (primary) hypertension: Secondary | ICD-10-CM | POA: Diagnosis not present

## 2022-11-11 DIAGNOSIS — C7951 Secondary malignant neoplasm of bone: Secondary | ICD-10-CM | POA: Diagnosis not present

## 2022-11-11 MED ORDER — ZOLEDRONIC ACID 4 MG/100ML IV SOLN
4.0000 mg | INTRAVENOUS | Status: DC
  Administered 2022-11-11: 4 mg via INTRAVENOUS
  Filled 2022-11-11: qty 100

## 2022-11-11 MED ORDER — SODIUM CHLORIDE 0.9 % IV SOLN
Freq: Once | INTRAVENOUS | Status: AC
  Filled 2022-11-11: qty 250

## 2022-11-11 NOTE — Telephone Encounter (Signed)
Got confirmation that 91 pages was received by the PA department

## 2022-11-11 NOTE — Telephone Encounter (Addendum)
Called  [phone] to find out the status of the PA request for the [facility]. They state that they have not received a PA for the medication. Confirmed fax number was [phone] and refaxed the request.

## 2022-11-11 NOTE — Progress Notes (Signed)
Hematology/Oncology Consult note [facility]  [phone] [phone]  Patient Care Team: Remi Haggard, FNP as PCP - General (Family Medicine) Telford Nab, RN as Oncology Nurse Navigator Noreene Filbert, MD as Radiation Oncologist (Radiation Oncology) Ottie Glazier, MD as Consulting Physician (Pulmonary Disease)   Name of the patient: Gina Sanchez  [id number]  [birth year]   Date of visit: 11/11/22  Diagnosis-metastatic lung cancer  Chief complaint/ Reason for visit-routine follow-up of lung cancer on sotorasib  Heme/Onc history: Patient is a 57 year old female with a past medical history significant for hypertension hyperlipidemia and anxiety who presented with right thigh pain and was found to have an acute right proximal femoral shaft fracture.  She underwent operative fixation on 10/12/2020.  MRI of femur showed heterogeneously enhancing osseous lesion within the area which would be nonspecific versus office neoplasm or metastatic lesion.  CT chest abdomen and pelvis with contrast showed an enlarged pretracheal lymph node 2.2 x 1.5 cm and a right paratracheal lymph node measuring 2.7 x 1.3 cm.  Prevascular node measuring 0.3 x 0.9 cm.  5 x 4 mm right middle lobe nodule.  2.8 x 1.9 cm left adrenal lesion.    Reamings from the right femur showed metastatic poorly differentiated carcinoma.  Immunohistochemistry showed was positive for pancytokeratin, CK7 and patchy CK20 with patchy dim expression of TTF-1.  Cells negative for Melan-A, CDX2, PAX8, Napsin A, GATA3, p40, CD56, p16 and thyroglobulin.  Findings compatible with metastatic carcinoma but because of decalcification immunohistochemical staining is unreliable.  Patchy dim staining with TTF-1 suspicious for lung primary but not a definitive diagnosis.   Repeat supraclavicular lymph node biopsy showed metastatic adenocarcinoma.  Tumor cells positive for CK7 with focal weak staining  for TTF-1.  Suggestive of lung origin in the proper clinical context.  Cells were negative for GATA3 PAX8 CDX2 and CK20 and Napsin A.  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  She is currently on hydrocortisone twice daily.  Thyroid functions presently are normal     Patient was treated with Norma Fredrickson Alimta Keytruda chemotherapy first-line followed by maintenance Alimta and Keytruda.  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  She is on Entyvio and follows up with GI for her autoimmune colitis.  Most recent regimen was maintenance Alimta. Disease progression in Jan 2024. Patients started on sotorasib.  Presently on full dose 960 mg daily    Interval history-patient is tolerating sotorasib well without any significant nausea vomiting or diarrhea.  Symptoms of headache are better.  Bone pain is better.  She is having to use only 1 or 2 doses of oxycodone during the day.  She is still on Entyvio every 4 weeks for her inflammatory bowel disease which is also improving.  She does not have frequent episodes of diarrhea although her bowel movements can be still watery at times.  She is able to go out and enjoy her life better.  ECOG PS- 1 Pain scale- 2 Opioid associated constipation- no  Review of systems- Review of Systems  Constitutional:  Positive for malaise/fatigue. Negative for chills, fever and weight loss.  HENT:  Negative for congestion, ear discharge and nosebleeds.   Eyes:  Negative for blurred vision.  Respiratory:  Negative for cough, hemoptysis, sputum production, shortness of breath and wheezing.   Cardiovascular:  Negative for chest pain, palpitations, orthopnea and claudication.  Gastrointestinal:  Positive for diarrhea. Negative for abdominal pain, blood in stool, constipation, heartburn, melena, nausea and  vomiting.  Genitourinary:  Negative for dysuria, flank pain, frequency, hematuria and urgency.  Musculoskeletal:  Negative for back pain, joint pain and myalgias.  Skin:  Negative for rash.  Neurological:  Negative for dizziness, tingling, focal weakness, seizures, weakness and headaches.  Endo/Heme/Allergies:  Does not bruise/bleed easily.  Psychiatric/Behavioral:  Negative for depression and suicidal ideas. The patient does not have insomnia.       Allergies  Allergen Reactions   Factive [Gemifloxacin] Rash     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   Palpitations    Precordial chest pain    Wrist fracture 03/2021   left     Past Surgical History:  Procedure Laterality Date   BREAST BIOPSY Right 2017   benign   CERVICAL BIOPSY  W/ LOOP ELECTRODE EXCISION     COLONOSCOPY  06/11/2022   Procedure: COLONOSCOPY;  Surgeon: Lin Landsman, MD;  Location: [facility] ENDOSCOPY;  Service: Gastroenterology;;   COLONOSCOPY WITH PROPOFOL N/A 07/03/2015   Procedure: COLONOSCOPY WITH PROPOFOL;  Surgeon: Hulen Luster, MD;  Location: [facility] ENDOSCOPY;  Service: Gastroenterology;  Laterality: N/A;   COLONOSCOPY WITH PROPOFOL N/A 01/06/2022   Procedure: COLONOSCOPY WITH PROPOFOL;  Surgeon: Lin Landsman, MD;  Location: [facility] ENDOSCOPY;  Service: Gastroenterology;  Laterality: N/A;   ESOPHAGOGASTRODUODENOSCOPY (EGD) WITH PROPOFOL N/A 07/03/2015   Procedure: ESOPHAGOGASTRODUODENOSCOPY (EGD) WITH PROPOFOL;  Surgeon: Hulen Luster, MD;  Location: [facility] ENDOSCOPY;  Service: Gastroenterology;  Laterality: N/A;   FLEXIBLE SIGMOIDOSCOPY N/A 03/12/2022   Procedure: FLEXIBLE SIGMOIDOSCOPY;  Surgeon: Lin Landsman, MD;  Location: [facility] ENDOSCOPY;  Service:  Gastroenterology;  Laterality: N/A;   FLEXIBLE SIGMOIDOSCOPY N/A 08/17/2022   Procedure: FLEXIBLE SIGMOIDOSCOPY;  Surgeon: Lin Landsman, MD;  Location: [facility] ENDOSCOPY;  Service: Gastroenterology;  Laterality: N/A;   FRACTURE SURGERY     INTRAMEDULLARY (IM) NAIL INTERTROCHANTERIC Right 09/15/2020   Procedure: INTRAMEDULLARY (IM) NAIL INTERTROCHANTRIC;  Surgeon: Corky Mull, MD;  Location: [facility] ORS;  Service: Orthopedics;  Laterality: Right;   IR CV LINE INJECTION  07/14/2022   IR IMAGING GUIDED PORT INSERTION  11/28/2020   LEFT HEART CATH AND CORONARY ANGIOGRAPHY N/A 03/28/2017   Procedure: Left Heart Cath and Coronary Angiography;  Surgeon: Lorretta Harp, MD;  Location: [place] CV LAB;  Service: Cardiovascular;  Laterality: N/A;   ORIF WRIST FRACTURE Left 03/31/2021   Procedure: OPEN REDUCTION INTERNAL FIXATION (ORIF) LEFT DISTAL RADIUS FRACTURE.;  Surgeon: Corky Mull, MD;  Location: [facility] ORS;  Service: Orthopedics;  Laterality: Left;    Social History   Socioeconomic History   Marital status: Married    Spouse name: Gene   Number of children: 2   Years of education: Not on file   Highest education level: Not on file  Occupational History   Not on file  Tobacco Use   Smoking status: Former    Packs/day: 1.00    Years: 30.00    Total pack years: 30.00    Types: Cigarettes    Quit date: 09/17/2020  Years since quitting: 2.1   Smokeless tobacco: Never  Vaping Use   Vaping Use: Never used  Substance and Sexual Activity   Alcohol use: Not Currently    Alcohol/week: 1.0 standard drink of alcohol    Types: 1 Standard drinks or equivalent per week    Comment: beer occ.   Drug use: No   Sexual activity: Yes    Birth control/protection: Post-menopausal  Other Topics Concern   Not on file  Social History Narrative   Lives in [place] with her husband.  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   Social Determinants of  Health   Financial Resource Strain: Not on file  Food Insecurity: Not on file  Transportation Needs: No Transportation Needs (09/20/2022)   PRAPARE - Hydrologist (Medical): No    Lack of Transportation (Non-Medical): No  Physical Activity: Not on file  Stress: Not on file  Social Connections: Not on file  Intimate Partner Violence: Not on file    Family History  Problem Relation Age of Onset   Diabetes Mother        alive @ 94   Goiter Mother    Heart disease Father        died of MI @ 64   Osteoporosis Maternal Grandmother    Colon cancer Maternal Grandfather    Breast cancer Neg Hx    Ovarian cancer Neg Hx      Current Outpatient Medications:    acetaminophen (TYLENOL) 500 MG tablet, Take 500 mg by mouth every 6 (six) hours as needed., Disp: , Rfl:    ALPRAZolam (XANAX) 0.5 MG tablet, TAKE ONE TABLET DAILY AS NEEDED, Disp: 30 tablet, Rfl: 0   citalopram (CELEXA) 10 MG tablet, Take 10 mg by mouth daily., Disp: , Rfl:    dicyclomine (BENTYL) 10 MG capsule, Take 1 capsule (10 mg total) by mouth every 8 (eight) hours as needed for spasms., Disp: 30 capsule, Rfl: 0   hydrocortisone (CORTEF) 10 MG tablet, TAKE 1 TABLET BY MOUTH NIGHTLY, Disp: 30 tablet, Rfl: 3   hydrocortisone (CORTEF) 20 MG tablet, TAKE 1 TABLET BY MOUTH EVERY MORNING, Disp: 30 tablet, Rfl: 3   LUMAKRAS 320 MG tablet, TAKE 3 TABLETS BY MOUTH 1 TIME A DAY, Disp: 90 tablet, Rfl: 1   metoprolol tartrate (LOPRESSOR) 25 MG tablet, Take 25 mg by mouth in the morning., Disp: , Rfl:    nitrofurantoin, macrocrystal-monohydrate, (MACROBID) 100 MG capsule, Take 100 mg by mouth 2 (two) times daily., Disp: , Rfl:    ondansetron (ZOFRAN) 4 MG tablet, TAKE 1 TABLET BY MOUTH EVERY 8 HOURS AS NEEDED FOR NAUSEA AND VOMITING, Disp: 20 tablet, Rfl: 1   oxyCODONE (OXY IR/ROXICODONE) 5 MG immediate release tablet, Take 1-2 tablets (5-10 mg total) by mouth every 4 (four) hours as needed for severe pain., Disp:  60 tablet, Rfl: 0   oxyCODONE (OXYCONTIN) 10 mg 12 hr tablet, Take 1 tablet (10 mg total) by mouth every 12 (twelve) hours., Disp: 60 tablet, Rfl: 0   valACYclovir (VALTREX) 1000 MG tablet, Take 1 tablet (1,000 mg total) by mouth 3 (three) times daily., Disp: 30 tablet, Rfl: 0   Vedolizumab (ENTYVIO IV), Inject 1 Dose into the vein as directed. Every 4 weeks at her home, Disp: , Rfl:  No current facility-administered medications for this visit.  Facility-Administered Medications Ordered in Other Visits:    sodium chloride flush (NS) 0.9 %  injection 10 mL, 10 mL, Intravenous, PRN, Sindy Guadeloupe, MD, 10 mL at 03/09/21 0906  Physical exam:  Vitals:   11/10/22 1430  BP: 118/83  Pulse: 85  Temp: (!) 95.9 F (35.5 C)  TempSrc: Tympanic  SpO2: 99%  Weight: 189 lb 12.8 oz (86.1 kg)  Height: '5\' 6"'$  (1.676 m)   Physical Exam Cardiovascular:     Rate and Rhythm: Normal rate and regular rhythm.     Heart sounds: Normal heart sounds.  Pulmonary:     Effort: Pulmonary effort is normal.     Breath sounds: Normal breath sounds.  Abdominal:     General: Bowel sounds are normal.     Palpations: Abdomen is soft.  Skin:    General: Skin is warm and dry.  Neurological:     Mental Status: She is alert and oriented to person, place, and time.         Latest Ref Rng & Units 11/10/2022    2:11 PM  CMP  Glucose 70 - 99 mg/dL 98   BUN 6 - 20 mg/dL 11   Creatinine 0.44 - 1.00 mg/dL 0.60   Sodium 135 - 145 mmol/L 137   Potassium 3.5 - 5.1 mmol/L 4.2   Chloride 98 - 111 mmol/L 105   CO2 22 - 32 mmol/L 23   Calcium 8.9 - 10.3 mg/dL 8.8   Total Protein 6.5 - 8.1 g/dL 7.3   Total Bilirubin 0.3 - 1.2 mg/dL 0.3   Alkaline Phos 38 - 126 U/L 319   AST 15 - 41 U/L 23   ALT 0 - 44 U/L 16       Latest Ref Rng & Units 11/10/2022    2:11 PM  CBC  WBC 4.0 - 10.5 K/uL 8.8   Hemoglobin 12.0 - 15.0 g/dL 11.7   Hematocrit 36.0 - 46.0 % 38.0   Platelets 150 - 400 K/uL 306      Assessment and plan-  Patient is a 57 y.o. female with metastatic lung cancer on sotorasib you for routine follow-up  Patient was started on sotorasib in January 2023 which she is presently tolerating it well at full dose of 960 mg daily.  Denies any significant nausea or vomiting.  She has mild baseline diarrhea secondary to her inflammatory bowel disease which is actually getting better while she is on Entyvio.  Symptomatically patient is better after starting sotorasib.  Body aches are fewer and quality of life is better.  She also received palliative radiation to her neck given theSuperior mediastinal lymph node that was partially occluding the SVC.  Alkaline phosphatase is trending down and liver function tests are normal.  Patient daughter's wedding is coming up on April 11.  I will see her during the week of December 27, 2022 with scans and labs prior.  Neoplasm related pain: Continue OxyContin and as needed oxycodone  Bone metastases: She will get Zometa tomorrow   Visit Diagnosis 1. Primary malignant neoplasm of right lung metastatic to other site [facility])   2. High risk medication use   3. Encounter for monitoring zoledronic acid therapy      Dr. Randa Evens, MD, MPH [facility] at [facility] [id number] 11/11/2022 10:35 AM

## 2022-11-11 NOTE — Patient Instructions (Signed)

## 2022-11-15 NOTE — Telephone Encounter (Signed)
PA for Gina Sanchez has been approved from 11/14/2022 to 12/14/2022

## 2022-11-18 ENCOUNTER — Other Ambulatory Visit: Payer: Self-pay | Admitting: *Deleted

## 2022-11-18 MED ORDER — OXYCODONE HCL ER 10 MG PO T12A: 10.0000 mg | EXTENDED_RELEASE_TABLET | Freq: Two times a day (BID) | ORAL | 0 refills | Status: DC

## 2022-11-18 MED ORDER — OXYCODONE HCL 5 MG PO TABS: 5.0000 mg | ORAL_TABLET | ORAL | 0 refills | Status: DC | PRN

## 2022-11-26 DIAGNOSIS — N390 Urinary tract infection, site not specified: Secondary | ICD-10-CM | POA: Diagnosis not present

## 2022-11-29 NOTE — Telephone Encounter (Signed)
Pt called in ref to her prior authorization on medication Entyvio please return call.

## 2022-11-29 NOTE — Telephone Encounter (Signed)
Informed patient it has been approved for the medication and is good till 12/14/2022. Informed her I informed the pharmacy of this information to. She states  she will call them back

## 2022-11-30 ENCOUNTER — Telehealth: Payer: 59 | Admitting: Gastroenterology

## 2022-11-30 NOTE — Telephone Encounter (Signed)
Called the pharmacy appeal department because we did file a appeal as urgent on 11/02/2022. Called and talk to [facility] and she said the Appeal adverted and she does not know why she contacting her management and they said it was adverted because the patient has not gave authorization for [place] to do the appeal.  She will have to call the appeal department or come by our office and sign the form.  Was on the phone with them 45 minutes   Tried to call the patient but had to leave a message for call back

## 2022-11-30 NOTE — Telephone Encounter (Signed)
Called patient and patient states she was on the phone with insurance for a hour this morning. She states she will come by our office this afternoon to sign the forms

## 2022-11-30 NOTE — Telephone Encounter (Signed)
Patient calling about Weyman Rodney and her insurance. States they aren't sending the prescription. Requesting call back.

## 2022-11-30 NOTE — Telephone Encounter (Signed)
Tried to called [facility] but they said the system was experience technical  difficult please try again later.

## 2022-11-30 NOTE — Telephone Encounter (Signed)
Patient came to sign the consent for insurance for the appeal. Refaxed the appeal with the consent to insurance company marked as urgent. Attached, letter, last office visit, labs procedures reports, and path reports.

## 2022-11-30 NOTE — Telephone Encounter (Addendum)
[employer] and they said the medication is approved through medical insurance with patient insurance and the Provider being [facility]. She gave them there number [id number] and talk to [place] and she said on her end it is showing it needs a PA and she can not do anything about it. I asked her if I could give her the case number and approval dates and she said no she does not need that because that the benefits team handles this. Asked if I could talk to someone in the benefits team then she said yes let me transfer you.  Talk to [facility] with the Benefits department and she said she asked [place] if she was given a approval number and she said no. Informed Nicki that I tried to give her the approval number and she said she does do that information.  Nicki took the case number and she said it still needed the PA. She states that on 11/01/2022 the medication was billed on the pharmacy side and was billed for a 5 dollar copay. Informed her I have also got it approved on the medical side of the patient benefits. She states she will get her team to run the claim on the medical benefits and so the patient can get the medication  Case number:  [id number]  Approval dates are 11/14/2022 to 12/14/2022  Talk to [facility] for 15 minutes  Talk to [facility] for 30 minutes

## 2022-12-01 NOTE — Telephone Encounter (Signed)
Got confirmation that 104 pages was sent to the appeal department

## 2022-12-01 NOTE — Telephone Encounter (Signed)
[employer] company the appeal department and they did receive the appeal but it has not been put in the system yet. They said it should be put in the system in the next 24 hours.

## 2022-12-02 NOTE — Telephone Encounter (Signed)
Called and left a message for Geni Bers at [phone] to find out why the appeal did not meet expedite Criteria. Left a detail message explaining patient is due for her next does anytime now and if she does not get the medication her colitis symptoms could get worse.

## 2022-12-02 NOTE — Telephone Encounter (Signed)
Called and talk to Gina Sanchez with the insurance company appeal department. She states it says that a determination should be today but does say that the requested does not meet expedite criteria. They said they wrote this in a letter and it gave the lady name if we do not agree with this. She is going to fax me that letter.   Reference number for call [id number]

## 2022-12-02 NOTE — Telephone Encounter (Signed)
Called [phone] and left a detail message on this line.

## 2022-12-03 ENCOUNTER — Telehealth: Payer: Self-pay

## 2022-12-03 NOTE — Telephone Encounter (Signed)
Got confirmation pages went through

## 2022-12-03 NOTE — Telephone Encounter (Signed)
Got confirmation

## 2022-12-03 NOTE — Telephone Encounter (Signed)
Called the appeal department and talk to Dena  and dena states they are still saying they did not received the consent from the patient for [place] to process the claim. Asked Dena how yesterday I talk to Opal Sidles and she said they had all the documentation. She said she does not know why she would of said that. She then asked what faxed number I had faxed the information to. [phone] and informed her that was the number on the denial letter. She said she is very sorry but they always put the wrong fax number on denial letter and they are trying to fix that. Informed her yes because patient needs her medication like today. She asked [place] to refaxed everything to [phone] which is her department fax number. She said then it would take 24 hours to up load and then they will have it to give to the appeal department. Refaxed information to them. Called patient and informed patient a update on the approval process. She thanked me for everything I am doing.

## 2022-12-06 ENCOUNTER — Other Ambulatory Visit: Payer: Self-pay | Admitting: Oncology

## 2022-12-06 NOTE — Telephone Encounter (Signed)
Informed patient of the update of the medication. She was very appreciated of everything we are doing

## 2022-12-06 NOTE — Telephone Encounter (Signed)
Please advise what you recommend to do

## 2022-12-06 NOTE — Telephone Encounter (Addendum)
Called the appeal department and talk to [place] and she says it is marked not as urgent and the expected date is 12/31/2022. Informed her the patient is due for a infusion now and if she does not get it then her colitis is going to get worse. She said the only thing we can do is leave a message for clinical team and they will get back to [place]. She said to call [phone].  Called Geni Bers at [phone] and left a message also called [phone] and left a message for call back

## 2022-12-06 NOTE — Telephone Encounter (Signed)
Arlet called back and she states since the denial is for a plan inclusion there is no way it could get changed to urgent expedited. She states a determination should be made by 12/31/2022

## 2022-12-07 ENCOUNTER — Encounter: Payer: Self-pay | Admitting: Oncology

## 2022-12-07 ENCOUNTER — Encounter: Payer: Self-pay | Admitting: Gastroenterology

## 2022-12-07 NOTE — Telephone Encounter (Signed)
I have written another letter in order to expedite the approval of vedolizumab  RV

## 2022-12-08 ENCOUNTER — Encounter
Admission: RE | Admit: 2022-12-08 | Discharge: 2022-12-08 | Disposition: A | Discharge status: Home / Self Care | Payer: 59 | Source: Ambulatory Visit | Attending: Oncology | Admitting: Oncology

## 2022-12-08 ENCOUNTER — Telehealth: Payer: Self-pay

## 2022-12-08 DIAGNOSIS — C3490 Malignant neoplasm of unspecified part of unspecified bronchus or lung: Secondary | ICD-10-CM | POA: Diagnosis not present

## 2022-12-08 DIAGNOSIS — C7951 Secondary malignant neoplasm of bone: Secondary | ICD-10-CM | POA: Diagnosis not present

## 2022-12-08 DIAGNOSIS — C3491 Malignant neoplasm of unspecified part of right bronchus or lung: Secondary | ICD-10-CM | POA: Diagnosis not present

## 2022-12-08 MED ORDER — TECHNETIUM TC 99M MEDRONATE IV KIT
20.0000 | PACK | Freq: Once | INTRAVENOUS | Status: AC | PRN
  Administered 2022-12-08: 21.41 via INTRAVENOUS

## 2022-12-08 NOTE — Telephone Encounter (Signed)
Faxed the letter for expedited appeal and got confirmation

## 2022-12-08 NOTE — Telephone Encounter (Signed)
Faxed PA to patient medical side of insurance for the [facility] 300mg .

## 2022-12-09 NOTE — Telephone Encounter (Signed)
They said they did receive the new letter for the expedited  appeal yesterday but the turn around time is 3 to 5 business days.   Document Control number [id number]

## 2022-12-13 NOTE — Telephone Encounter (Signed)
Called to check the status of the PA on the medical side. They said they never received the request and to refax the PA to [phone] and to call back in two to three hours to check the status of the PA and make sure they received it.

## 2022-12-13 NOTE — Telephone Encounter (Signed)
Called to check on appeal she states it is still processing to find out if the request is expedited or not. They said that decision should be today. Asked if I could talk to someone higher up and she said there is not. She said to give it more time

## 2022-12-13 NOTE — Telephone Encounter (Signed)
Got confirmation that fax went through 91 pages at 10:06

## 2022-12-15 ENCOUNTER — Ambulatory Visit
Admission: RE | Admit: 2022-12-15 | Discharge: 2022-12-15 | Disposition: A | Discharge status: Home / Self Care | Payer: 59 | Source: Ambulatory Visit | Attending: Oncology | Admitting: Oncology

## 2022-12-15 ENCOUNTER — Other Ambulatory Visit: Payer: Self-pay | Admitting: Oncology

## 2022-12-15 DIAGNOSIS — C3491 Malignant neoplasm of unspecified part of right bronchus or lung: Secondary | ICD-10-CM | POA: Diagnosis not present

## 2022-12-15 DIAGNOSIS — R918 Other nonspecific abnormal finding of lung field: Secondary | ICD-10-CM | POA: Diagnosis not present

## 2022-12-15 DIAGNOSIS — K5730 Diverticulosis of large intestine without perforation or abscess without bleeding: Secondary | ICD-10-CM | POA: Diagnosis not present

## 2022-12-15 DIAGNOSIS — C7951 Secondary malignant neoplasm of bone: Secondary | ICD-10-CM | POA: Diagnosis not present

## 2022-12-15 DIAGNOSIS — C801 Malignant (primary) neoplasm, unspecified: Secondary | ICD-10-CM | POA: Diagnosis not present

## 2022-12-15 MED ORDER — IOHEXOL 300 MG/ML  SOLN
100.0000 mL | Freq: Once | INTRAMUSCULAR | Status: AC | PRN
  Administered 2022-12-15: 100 mL via INTRAVENOUS

## 2022-12-15 NOTE — Telephone Encounter (Signed)
Called and she states that they still do not have the PA on the medical side. Informed her I have sent this two times where is it going. She states she thinks it is getting sent to the Pharmacy side and not the medical side when it goes to the processing. She said I could put on cover sheet to file on medical side and informed her I have did this. She said I can email the PA to her manager at [email or web address] and she will process the PA as soon as she gets it. Emailed the PA to them

## 2022-12-15 NOTE — Telephone Encounter (Signed)
Called PA department at [phone]

## 2022-12-15 NOTE — Telephone Encounter (Signed)
Got faxed that they receive the PA on the medical side

## 2022-12-15 NOTE — Telephone Encounter (Signed)
Called and informed patient a update and she verbalized understanding. She states she is not currently having any colitis symptoms. Informed her to let us know if she begans to have any.

## 2022-12-15 NOTE — Telephone Encounter (Signed)
Called Appeal department she said that the status is still in transfer status and she does not know anything else. I informed them if the patient does not get this medication soon her colitis symptoms are going to return and she could end up the hospital. She states they have no controled about the appeals. She states she is the middle man.  All we can do is call is the number for the expedited at [phone] and leave a voicemail  and they will only return your call if the appeal is expedited criteria.  I informed them this was horrible customer service for the patient. Asked if I could talk to a supervisor she states yes she will see if someone is available. She states she will have to create a transfer. Was on the phone for 30 minutes then got disconnected  Reference number for call [id number]  The manger then called me back. He states there is nothing they can do because they are only the middle man for the appeal and they do not process the appeal. He states the only thing we can do is call and leave a message on the line and it they feel it is expedited then they will call us back if not they will not call us back. He states that they have no control. Informed him also this is bad customer service for the patient.  Was on the phone for 10 minutes.  Reference number [id number]   Called the expedited control line and left a message on the line. They said if it is expedited they will notify us with in 24 hours if not they will not call us.   Patient is now 2 weeks late on the Entyvio infusion.   Called and left a message for call back

## 2022-12-16 NOTE — Telephone Encounter (Signed)
Medical side approved the Entyvio from 12/15/2022 to 12/15/2023. Informed patient of this information

## 2022-12-17 ENCOUNTER — Ambulatory Visit
Admission: RE | Admit: 2022-12-17 | Discharge: 2022-12-17 | Disposition: A | Discharge status: Home / Self Care | Payer: 59 | Source: Ambulatory Visit | Attending: Oncology | Admitting: Oncology

## 2022-12-17 ENCOUNTER — Other Ambulatory Visit: Payer: Self-pay | Admitting: *Deleted

## 2022-12-17 ENCOUNTER — Encounter: Payer: Self-pay | Admitting: Oncology

## 2022-12-17 ENCOUNTER — Inpatient Hospital Stay: Payer: 59 | Attending: Oncology

## 2022-12-17 ENCOUNTER — Inpatient Hospital Stay: Payer: 59 | Admitting: Oncology

## 2022-12-17 VITALS — BP 126/99 | HR 108 | Temp 98.5°F | Resp 16 | Ht 66.0 in | Wt 191.0 lb

## 2022-12-17 DIAGNOSIS — E2740 Unspecified adrenocortical insufficiency: Secondary | ICD-10-CM | POA: Insufficient documentation

## 2022-12-17 DIAGNOSIS — Z87891 Personal history of nicotine dependence: Secondary | ICD-10-CM | POA: Insufficient documentation

## 2022-12-17 DIAGNOSIS — Z7189 Other specified counseling: Secondary | ICD-10-CM

## 2022-12-17 DIAGNOSIS — C7951 Secondary malignant neoplasm of bone: Secondary | ICD-10-CM | POA: Insufficient documentation

## 2022-12-17 DIAGNOSIS — C3491 Malignant neoplasm of unspecified part of right bronchus or lung: Secondary | ICD-10-CM | POA: Insufficient documentation

## 2022-12-17 DIAGNOSIS — M898X5 Other specified disorders of bone, thigh: Secondary | ICD-10-CM | POA: Insufficient documentation

## 2022-12-17 DIAGNOSIS — Z800 Family history of malignant neoplasm of digestive organs: Secondary | ICD-10-CM | POA: Insufficient documentation

## 2022-12-17 DIAGNOSIS — I10 Essential (primary) hypertension: Secondary | ICD-10-CM | POA: Insufficient documentation

## 2022-12-17 DIAGNOSIS — D8989 Other specified disorders involving the immune mechanism, not elsewhere classified: Secondary | ICD-10-CM | POA: Diagnosis not present

## 2022-12-17 DIAGNOSIS — Z79899 Other long term (current) drug therapy: Secondary | ICD-10-CM | POA: Diagnosis not present

## 2022-12-17 DIAGNOSIS — G893 Neoplasm related pain (acute) (chronic): Secondary | ICD-10-CM | POA: Diagnosis not present

## 2022-12-17 DIAGNOSIS — M79652 Pain in left thigh: Secondary | ICD-10-CM | POA: Diagnosis not present

## 2022-12-17 DIAGNOSIS — C342 Malignant neoplasm of middle lobe, bronchus or lung: Secondary | ICD-10-CM | POA: Diagnosis not present

## 2022-12-17 DIAGNOSIS — R102 Pelvic and perineal pain: Secondary | ICD-10-CM | POA: Diagnosis not present

## 2022-12-17 DIAGNOSIS — K219 Gastro-esophageal reflux disease without esophagitis: Secondary | ICD-10-CM | POA: Diagnosis not present

## 2022-12-17 LAB — CBC WITH DIFFERENTIAL/PLATELET
Abs Immature Granulocytes: 0.05 10*3/uL (ref 0.00–0.07)
Basophils Absolute: 0.1 10*3/uL (ref 0.0–0.1)
Basophils Relative: 1 %
Eosinophils Absolute: 0.2 10*3/uL (ref 0.0–0.5)
Eosinophils Relative: 2 %
HCT: 42.6 % (ref 36.0–46.0)
Hemoglobin: 13.4 g/dL (ref 12.0–15.0)
Immature Granulocytes: 1 %
Lymphocytes Relative: 11 %
Lymphs Abs: 1.3 10*3/uL (ref 0.7–4.0)
MCH: 25.2 pg — ABNORMAL LOW (ref 26.0–34.0)
MCHC: 31.5 g/dL (ref 30.0–36.0)
MCV: 80.2 fL (ref 80.0–100.0)
Monocytes Absolute: 0.6 10*3/uL (ref 0.1–1.0)
Monocytes Relative: 6 %
Neutro Abs: 8.9 10*3/uL — ABNORMAL HIGH (ref 1.7–7.7)
Neutrophils Relative %: 79 %
Platelets: 371 10*3/uL (ref 150–400)
RBC: 5.31 MIL/uL — ABNORMAL HIGH (ref 3.87–5.11)
RDW: 15.7 % — ABNORMAL HIGH (ref 11.5–15.5)
WBC: 11.1 10*3/uL — ABNORMAL HIGH (ref 4.0–10.5)
nRBC: 0 % (ref 0.0–0.2)

## 2022-12-17 LAB — COMPREHENSIVE METABOLIC PANEL
ALT: 13 U/L (ref 0–44)
AST: 19 U/L (ref 15–41)
Albumin: 3.6 g/dL (ref 3.5–5.0)
Alkaline Phosphatase: 297 U/L — ABNORMAL HIGH (ref 38–126)
Anion gap: 9 (ref 5–15)
BUN: 12 mg/dL (ref 6–20)
CO2: 23 mmol/L (ref 22–32)
Calcium: 9.2 mg/dL (ref 8.9–10.3)
Chloride: 104 mmol/L (ref 98–111)
Creatinine, Ser: 0.61 mg/dL (ref 0.44–1.00)
GFR, Estimated: >60 mL/min (ref >60)
Glucose, Bld: 108 mg/dL — ABNORMAL HIGH (ref 70–99)
Potassium: 3.7 mmol/L (ref 3.5–5.1)
Sodium: 136 mmol/L (ref 135–145)
Total Bilirubin: 0.5 mg/dL (ref 0.3–1.2)
Total Protein: 8.4 g/dL — ABNORMAL HIGH (ref 6.5–8.1)

## 2022-12-17 MED ORDER — OXYCODONE HCL 10 MG PO TABS: 10.0000 mg | ORAL_TABLET | ORAL | 0 refills | Status: DC | PRN

## 2022-12-17 MED ORDER — AMOXICILLIN-POT CLAVULANATE 875-125 MG PO TABS: 1.0000 | ORAL_TABLET | Freq: Two times a day (BID) | ORAL | 0 refills | Status: DC

## 2022-12-17 MED ORDER — ONDANSETRON HCL 4 MG PO TABS: ORAL_TABLET | ORAL | 1 refills | Status: DC

## 2022-12-17 MED ORDER — ALPRAZOLAM 0.5 MG PO TABS: ORAL_TABLET | ORAL | 0 refills | Status: DC

## 2022-12-17 MED ORDER — PROCHLORPERAZINE MALEATE 10 MG PO TABS: 10.0000 mg | ORAL_TABLET | Freq: Four times a day (QID) | ORAL | 1 refills | Status: DC | PRN

## 2022-12-20 ENCOUNTER — Other Ambulatory Visit: Payer: Self-pay | Admitting: *Deleted

## 2022-12-20 DIAGNOSIS — G893 Neoplasm related pain (acute) (chronic): Secondary | ICD-10-CM

## 2022-12-20 DIAGNOSIS — C3491 Malignant neoplasm of unspecified part of right bronchus or lung: Secondary | ICD-10-CM

## 2022-12-20 NOTE — Telephone Encounter (Signed)
[facility] sent RF request for oxycodone. Dr. Smith Robert- will you consider RF. Sharia Reeve is not back in the office until Wed. 4/10

## 2022-12-21 ENCOUNTER — Encounter: Payer: Self-pay | Admitting: *Deleted

## 2022-12-21 ENCOUNTER — Telehealth: Payer: Self-pay | Admitting: *Deleted

## 2022-12-21 ENCOUNTER — Encounter: Payer: Self-pay | Admitting: Oncology

## 2022-12-21 MED ORDER — OXYCODONE HCL 10 MG PO TABS: 10.0000 mg | ORAL_TABLET | ORAL | 0 refills | Status: DC | PRN

## 2022-12-21 NOTE — Telephone Encounter (Signed)
I called the pt. And told her that she can take 20 mg of the break through pain . So the short activng 10 mg you can take 2 at a time. The meds will go faster since we doubled it. So she should let us know when she is down to 3 days worth of meds/ pt will do that and let us know.

## 2022-12-21 NOTE — Telephone Encounter (Signed)
Gina Sanchez- can you have her double up her oxycontin to 20 mg Q12?

## 2022-12-21 NOTE — Telephone Encounter (Signed)
Patient called stating Dr Smith Robert wanted her to let her know if her new pain medicine is working and she states that ot really is not working. She reports that her arm and legs hurt really bad and she is having difficulty walking. She states that her daughters wedding is Thursday. Please advise

## 2022-12-22 ENCOUNTER — Encounter: Payer: Self-pay | Admitting: Oncology

## 2022-12-23 ENCOUNTER — Other Ambulatory Visit: Payer: 59

## 2022-12-23 ENCOUNTER — Other Ambulatory Visit: Payer: Self-pay | Admitting: *Deleted

## 2022-12-23 ENCOUNTER — Inpatient Hospital Stay: Payer: 59

## 2022-12-23 DIAGNOSIS — C3491 Malignant neoplasm of unspecified part of right bronchus or lung: Secondary | ICD-10-CM

## 2022-12-23 DIAGNOSIS — M898X5 Other specified disorders of bone, thigh: Secondary | ICD-10-CM

## 2022-12-27 ENCOUNTER — Other Ambulatory Visit: Payer: Self-pay | Admitting: *Deleted

## 2022-12-27 ENCOUNTER — Ambulatory Visit: Payer: 59 | Admitting: Oncology

## 2022-12-27 ENCOUNTER — Encounter: Payer: Self-pay | Admitting: Oncology

## 2022-12-27 ENCOUNTER — Other Ambulatory Visit: Payer: 59

## 2022-12-27 MED ORDER — OXYCODONE HCL ER 20 MG PO T12A: 20.0000 mg | EXTENDED_RELEASE_TABLET | Freq: Two times a day (BID) | ORAL | 0 refills | Status: DC

## 2022-12-27 NOTE — Progress Notes (Signed)
Hematology/Oncology Consult note [facility]  Telephone:([phone] [phone]  Patient Care Team: Armando Gang, FNP as PCP - General (Family Medicine) Glory Buff, RN as Oncology Nurse Navigator Carmina Miller, MD as Radiation Oncologist (Radiation Oncology) Vida Rigger, MD as Consulting Physician (Pulmonary Disease)   Name of the patient: Gina Sanchez  [id number]  [birth year]   Date of visit: 12/27/22  Diagnosis-metastatic adenocarcinoma of the lung  Chief complaint/ Reason for visit-discuss CT scan results and further management  Heme/Onc history: Patient is a 57 year old female with a past medical history significant for hypertension hyperlipidemia and anxiety who presented with right thigh pain and was found to have an acute right proximal femoral shaft fracture.  She underwent operative fixation on 10/12/2020.  MRI of femur showed heterogeneously enhancing osseous lesion within the area which would be nonspecific versus office neoplasm or metastatic lesion.  CT chest abdomen and pelvis with contrast showed an enlarged pretracheal lymph node 2.2 x 1.5 cm and a right paratracheal lymph node measuring 2.7 x 1.3 cm.  Prevascular node measuring 0.3 x 0.9 cm.  5 x 4 mm right middle lobe nodule.  2.8 x 1.9 cm left adrenal lesion.    Reamings from the right femur showed metastatic poorly differentiated carcinoma.  Immunohistochemistry showed was positive for pancytokeratin, CK7 and patchy CK20 with patchy dim expression of TTF-1.  Cells negative for Melan-A, CDX2, PAX8, Napsin A, GATA3, p40, CD56, p16 and thyroglobulin.  Findings compatible with metastatic carcinoma but because of decalcification immunohistochemical staining is unreliable.  Patchy dim staining with TTF-1 suspicious for lung primary but not a definitive diagnosis.   Repeat supraclavicular lymph node biopsy showed metastatic adenocarcinoma.  Tumor cells positive for CK7 with focal  weak staining for TTF-1.  Suggestive of lung origin in the proper clinical context.  Cells were negative for GATA3 PAX8 CDX2 and CK20 and Napsin A.  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  She is currently on hydrocortisone twice daily.  Thyroid functions presently are normal     Patient was treated with Ledell Noss Alimta Keytruda chemotherapy first-line followed by maintenance Alimta and Keytruda.  She developed autoimmune colitis secondary to [facility] and therefore that was stopped.  She is on Entyvio and follows up with GI for her autoimmune colitis.  Most recent regimen was maintenance Alimta. Disease progression in Jan 2024. Patients started on sotorasib.  Presently on full dose 960 mg daily  Interval history-tolerating sotorasib well without any significant GI side effects.  Facial swelling has gotten better.  She does report some ongoing left hip pain.  She is on as needed oxycodone as well as OxyContin  ECOG PS- 1 Pain scale- 5 Opioid associated constipation- no  Review of systems- Review of Systems  Constitutional:  Positive for malaise/fatigue. Negative for chills, fever and weight loss.  HENT:  Negative for congestion, ear discharge and nosebleeds.   Eyes:  Negative for blurred vision.  Respiratory:  Negative for cough, hemoptysis, sputum production, shortness of breath and wheezing.   Cardiovascular:  Negative for chest pain, palpitations, orthopnea and claudication.  Gastrointestinal:  Negative for abdominal pain, blood in stool, constipation, diarrhea, heartburn, melena, nausea and vomiting.  Genitourinary:  Negative for dysuria, flank pain, frequency, hematuria and urgency.  Musculoskeletal:  Positive for joint pain (Left hip pain). Negative for back pain and  myalgias.  Skin:  Negative for rash.  Neurological:  Negative for dizziness, tingling,  focal weakness, seizures, weakness and headaches.  Endo/Heme/Allergies:  Does not bruise/bleed easily.  Psychiatric/Behavioral:  Negative for depression and suicidal ideas. The patient does not have insomnia.       Allergies  Allergen Reactions   Factive [Gemifloxacin] Rash     Past Medical History:  Diagnosis Date   Abnormal Pap smear of cervix    Anxiety    Depression    Diverticulosis    Essential hypertension    Femur fracture, right    GERD (gastroesophageal reflux disease)    Lung cancer    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   Palpitations    Precordial chest pain    Wrist fracture 03/2021   left     Past Surgical History:  Procedure Laterality Date   BREAST BIOPSY Right 2017   benign   CERVICAL BIOPSY  W/ LOOP ELECTRODE EXCISION     COLONOSCOPY  06/11/2022   Procedure: COLONOSCOPY;  Surgeon: Toney Reil, MD;  Location: [facility] ENDOSCOPY;  Service: Gastroenterology;;   COLONOSCOPY WITH PROPOFOL N/A 07/03/2015   Procedure: COLONOSCOPY WITH PROPOFOL;  Surgeon: Wallace Cullens, MD;  Location: [facility] ENDOSCOPY;  Service: Gastroenterology;  Laterality: N/A;   COLONOSCOPY WITH PROPOFOL N/A 01/06/2022   Procedure: COLONOSCOPY WITH PROPOFOL;  Surgeon: Toney Reil, MD;  Location: [facility] ENDOSCOPY;  Service: Gastroenterology;  Laterality: N/A;   ESOPHAGOGASTRODUODENOSCOPY (EGD) WITH PROPOFOL N/A 07/03/2015   Procedure: ESOPHAGOGASTRODUODENOSCOPY (EGD) WITH PROPOFOL;  Surgeon: Wallace Cullens, MD;  Location: [facility] ENDOSCOPY;  Service: Gastroenterology;  Laterality: N/A;   FLEXIBLE SIGMOIDOSCOPY N/A 03/12/2022   Procedure: FLEXIBLE SIGMOIDOSCOPY;  Surgeon: Toney Reil, MD;  Location: [facility] ENDOSCOPY;  Service: Gastroenterology;  Laterality: N/A;   FLEXIBLE SIGMOIDOSCOPY N/A 08/17/2022   Procedure: FLEXIBLE SIGMOIDOSCOPY;  Surgeon: Toney Reil, MD;  Location: [facility] ENDOSCOPY;  Service: Gastroenterology;  Laterality: N/A;   FRACTURE SURGERY     INTRAMEDULLARY (IM) NAIL  INTERTROCHANTERIC Right 09/15/2020   Procedure: INTRAMEDULLARY (IM) NAIL INTERTROCHANTRIC;  Surgeon: Christena Flake, MD;  Location: [facility] ORS;  Service: Orthopedics;  Laterality: Right;   IR CV LINE INJECTION  07/14/2022   IR IMAGING GUIDED PORT INSERTION  11/28/2020   LEFT HEART CATH AND CORONARY ANGIOGRAPHY N/A 03/28/2017   Procedure: Left Heart Cath and Coronary Angiography;  Surgeon: Runell Gess, MD;  Location: [facility] INVASIVE CV LAB;  Service: Cardiovascular;  Laterality: N/A;   ORIF WRIST FRACTURE Left 03/31/2021   Procedure: OPEN REDUCTION INTERNAL FIXATION (ORIF) LEFT DISTAL RADIUS FRACTURE.;  Surgeon: Christena Flake, MD;  Location: [facility] ORS;  Service: Orthopedics;  Laterality: Left;    Social History   Socioeconomic History   Marital status: Married    Spouse name: Gene   Number of children: 2   Years of education: Not on file   Highest education level: Not on file  Occupational History   Not on file  Tobacco Use   Smoking status: Former    Packs/day: 1.00    Years: 30.00    Additional pack years: 0.00    Total pack years: 30.00    Types: Cigarettes    Quit date: 09/17/2020    Years since quitting: 2.2   Smokeless tobacco: Never  Vaping Use   Vaping Use: Never used  Substance and Sexual Activity   Alcohol use: Not Currently    Alcohol/week: 1.0 standard drink of alcohol    Types: 1 Standard  drinks or equivalent per week    Comment: beer occ.   Drug use: No   Sexual activity: Yes    Birth control/protection: Post-menopausal  Other Topics Concern   Not on file  Social History Narrative   Lives in [place] with her husband.  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   Social Determinants of Health   Financial Resource Strain: Not on file  Food Insecurity: Not on file  Transportation Needs: No Transportation Needs (09/20/2022)   PRAPARE - Administrator, Civil Service (Medical): No    Lack of Transportation  (Non-Medical): No  Physical Activity: Not on file  Stress: Not on file  Social Connections: Not on file  Intimate Partner Violence: Not on file    Family History  Problem Relation Age of Onset   Diabetes Mother        alive @ 15   Goiter Mother    Heart disease Father        died of MI @ 78   Osteoporosis Maternal Grandmother    Colon cancer Maternal Grandfather    Breast cancer Neg Hx    Ovarian cancer Neg Hx      Current Outpatient Medications:    acetaminophen (TYLENOL) 500 MG tablet, Take 500 mg by mouth every 6 (six) hours as needed., Disp: , Rfl:    citalopram (CELEXA) 10 MG tablet, Take 10 mg by mouth daily., Disp: , Rfl:    dicyclomine (BENTYL) 10 MG capsule, Take 1 capsule (10 mg total) by mouth every 8 (eight) hours as needed for spasms., Disp: 30 capsule, Rfl: 0   hydrocortisone (CORTEF) 10 MG tablet, TAKE 1 TABLET BY MOUTH NIGHTLY, Disp: 30 tablet, Rfl: 3   hydrocortisone (CORTEF) 20 MG tablet, TAKE 1 TABLET BY MOUTH EVERY MORNING, Disp: 30 tablet, Rfl: 3   LUMAKRAS 320 MG tablet, TAKE 3 TABLETS BY MOUTH 1 TIME A DAY, Disp: 90 tablet, Rfl: 1   metoprolol tartrate (LOPRESSOR) 25 MG tablet, Take 25 mg by mouth in the morning., Disp: , Rfl:    ondansetron (ZOFRAN) 4 MG tablet, TAKE 1 TABLET BY MOUTH EVERY 8 HOURS AS NEEDED FOR NAUSEA AND VOMITING, Disp: 20 tablet, Rfl: 1   oxyCODONE (OXYCONTIN) 10 mg 12 hr tablet, Take 1 tablet (10 mg total) by mouth every 12 (twelve) hours., Disp: 60 tablet, Rfl: 0   valACYclovir (VALTREX) 1000 MG tablet, Take 1 tablet (1,000 mg total) by mouth 3 (three) times daily., Disp: 30 tablet, Rfl: 0   Vedolizumab (ENTYVIO IV), Inject 1 Dose into the vein as directed. Every 4 weeks at her home, Disp: , Rfl:    ALPRAZolam (XANAX) 0.5 MG tablet, TAKE ONE TABLET DAILY AS NEEDED, Disp: 30 tablet, Rfl: 0   amoxicillin-clavulanate (AUGMENTIN) 875-125 MG tablet, Take 1 tablet by mouth 2 (two) times daily., Disp: 14 tablet, Rfl: 0   nitrofurantoin,  macrocrystal-monohydrate, (MACROBID) 100 MG capsule, Take 100 mg by mouth 2 (two) times daily. (Patient not taking: Reported on 12/17/2022), Disp: , Rfl:    Oxycodone HCl 10 MG TABS, Take 1 tablet (10 mg total) by mouth every 4 (four) hours as needed., Disp: 120 tablet, Rfl: 0   prochlorperazine (COMPAZINE) 10 MG tablet, Take 1 tablet (10 mg total) by mouth every 6 (six) hours as needed for nausea or vomiting., Disp: 30 tablet, Rfl: 1 No current facility-administered medications for this visit.  Facility-Administered Medications Ordered in Other Visits:  sodium chloride flush (NS) 0.9 % injection 10 mL, 10 mL, Intravenous, PRN, Creig Hines, MD, 10 mL at 03/09/21 0906  Physical exam:  Vitals:   12/17/22 1434  BP: (!) 126/99  Pulse: (!) 108  Resp: 16  Temp: 98.5 F (36.9 C)  TempSrc: Tympanic  SpO2: 96%  Weight: 191 lb (86.6 kg)  Height: 5\' 6"  (1.676 m)   Physical Exam Cardiovascular:     Rate and Rhythm: Normal rate and regular rhythm.     Heart sounds: Normal heart sounds.  Pulmonary:     Effort: Pulmonary effort is normal.     Breath sounds: Normal breath sounds.  Abdominal:     General: There is no distension.  Skin:    General: Skin is warm and dry.  Neurological:     Mental Status: She is alert and oriented to person, place, and time.         Latest Ref Rng & Units 12/17/2022    2:20 PM  CMP  Glucose 70 - 99 mg/dL 604   BUN 6 - 20 mg/dL 12   Creatinine 5.40 - 1.00 mg/dL 9.81   Sodium 191 - 478 mmol/L 136   Potassium 3.5 - 5.1 mmol/L 3.7   Chloride 98 - 111 mmol/L 104   CO2 22 - 32 mmol/L 23   Calcium 8.9 - 10.3 mg/dL 9.2   Total Protein 6.5 - 8.1 g/dL 8.4   Total Bilirubin 0.3 - 1.2 mg/dL 0.5   Alkaline Phos 38 - 126 U/L 297   AST 15 - 41 U/L 19   ALT 0 - 44 U/L 13       Latest Ref Rng & Units 12/17/2022    2:20 PM  CBC  WBC 4.0 - 10.5 K/uL 11.1   Hemoglobin 12.0 - 15.0 g/dL 29.5   Hematocrit 62.1 - 46.0 % 42.6   Platelets 150 - 400 K/uL 371     No  images are attached to the encounter.  DG FEMUR MIN 2 VIEWS LEFT  Result Date: 12/21/2022 CLINICAL DATA:  Metastatic lung cancer.  Left femur pain. EXAM: LEFT FEMUR 2 VIEWS COMPARISON:  Bone scan 12/08/2022. FINDINGS: Sclerotic lesions are again noted throughout the left femur and pelvis consistent with known metastatic disease. Similar findings noted on prior recent bone scan. No acute abnormalities identified. No evidence of acute fracture or dislocation. Mild peripheral vascular calcification. IMPRESSION: Sclerotic lesions are again noted throughout the left femur and pelvis consistent with known metastatic disease. Similar findings noted on prior recent bone scan. No acute abnormality identified. Electronically Signed   By: Maisie Fus  Register M.D.   On: 12/21/2022 06:37   DG Pelvis 1-2 Views  Result Date: 12/21/2022 CLINICAL DATA:  History of metastatic lung cancer.  Pain left femur. EXAM: PELVIS - 1-2 VIEW COMPARISON:  CT 12/15/2022.  Bone scan 12/08/2022. FINDINGS: Prominent sclerotic lesions are again noted throughout the pelvis and proximal left femur consistent with known metastatic disease. Similar findings noted on prior recent bone scan. Degenerative changes lower lumbar spine and both hips. Postsurgical changes right femur noted. No evidence of acute fracture or dislocation. Pelvic calcifications consistent with phleboliths. IMPRESSION: 1. Prominent sclerotic lesions are again noted throughout the pelvis and proximal left femur consistent with known metastatic disease. Similar findings noted on prior recent bone scan. No evidence of acute fracture or dislocation. 2. Degenerative changes lower lumbar spine and both hips. Postsurgical changes right femur. Electronically Signed   By: Maisie Fus  Register M.D.  On: 12/21/2022 06:34   CT CHEST ABDOMEN PELVIS W CONTRAST  Result Date: 12/15/2022 CLINICAL DATA:  Left adrenal and right femoral metastasis. Recently stopped immunotherapy secondary to diarrhea.  Radiation therapy in February. Cough. Ex-smoker. * Tracking Code: BO * EXAM: CT CHEST, ABDOMEN, AND PELVIS WITH CONTRAST TECHNIQUE: Multidetector CT imaging of the chest, abdomen and pelvis was performed following the standard protocol during bolus administration of intravenous contrast. RADIATION DOSE REDUCTION: This exam was performed according to the departmental dose-optimization program which includes automated exposure control, adjustment of the mA and/or kV according to patient size and/or use of iterative reconstruction technique. CONTRAST:  OMNIPAQUE IOHEXOL 300 MG/ML  SOLN COMPARISON:  Clinic note of 10/13/2022. 09/16/2022 chest abdomen and pelvic CTs. FINDINGS: CT CHEST FINDINGS Cardiovascular: Right Port-A-Cath terminates at the high right atrium. Aortic atherosclerosis. Normal heart size, without pericardial effusion. Lad and left circumflex coronary artery calcification. No central pulmonary embolism, on this non-dedicated study. Mediastinum/Nodes: 11 mm right thyroid nodule. Not clinically significant; no follow-up imaging recommended (ref: J Am Coll Radiol. 2015 Feb;12(2): 143-50) The right paratracheal soft tissue fullness is ill-defined, difficult to quantify. Estimated at 1.2 x 1.7 cm on 18/2 versus 2.0 x 2.5 cm on the prior. 8 mm node on 25/2 is enlarged since the prior, where it measured 2-3 mm. Lungs/Pleura: No pleural fluid. Medial right upper and adjacent superior segment right lower lobe consolidation versus mass. This is new at 3.8 x 3.4 cm on 65/3. Surrounding ground-glass. More peripheral right lower lobe "tree-in-bud" nodularity, including on 90/3. Patchy medial right upper lobe ground-glass. Right apical mild smooth septal thickening. The multiple, left greater than right pulmonary nodules have improved and resolved. Example pleural-based left lower lobe pulmonary nodule of 5 mm on 54/3 is less well-defined than on the prior where it measured 7 x 6 mm. Posterior left upper lobe 6  mm nodule on the prior has nearly completely resolved with minimal residual interstitial thickening on 40/3. Musculoskeletal: Development of multifocal sclerotic osseous metastasis, example within the right side of T12 at 4.0 cm on 57/2. CT ABDOMEN PELVIS FINDINGS Hepatobiliary: Subcentimeter hepatic dome cyst. No suspicious liver lesion or biliary abnormality. Pancreas: Normal, without mass or ductal dilatation. Spleen: Normal in size, without focal abnormality. Adrenals/Urinary Tract: Normal right adrenal gland. Left adrenal nodule measures 1.4 x 1.4 cm on 62/2 versus 1.9 x 1.7 cm on the prior. Bilateral renal too small to characterize lesions . In the absence of clinically indicated signs/symptoms require(s) no independent follow-up. No hydronephrosis. Normal urinary bladder. Stomach/Bowel: Normal stomach, without wall thickening. Scattered colonic diverticula. Mild pericolonic edema adjacent the descending colon including on 89/2. Normal terminal ileum and appendix. Normal small bowel. Vascular/Lymphatic: Aortic atherosclerosis. No abdominal adenopathy. A left external iliac 7 mm node on 112/2 is new since the prior but not pathologic by size criteria. Reproductive: Normal uterus and adnexa. Other: No significant free fluid.  No free intraperitoneal air. Omental/peritoneal metastasis again identified. Example 6 mm omental nodule on 61/2 within the right paramidline abdomen. 9 mm on the prior exam (when remeasured). Left paramidline more caudal abdominal implant measures 6 mm on 78/2 versus 10 mm on the prior Musculoskeletal: Right proximal femur fixation. Multifocal new sclerotic osseous metastasis. Example left iliac at 5.4 cm on 100/2. IMPRESSION: CT CHEST IMPRESSION 1. Response to therapy of pulmonary nodules and thoracic adenopathy. 2. New consolidation within the posteromedial right upper and adjacent right lower lobe. Right lower lobe "tree-in-bud" nodularity. Favor interval pneumonia with infectious  bronchiolitis.  A developing mass with postobstructive pneumonitis could look similar but is felt less likely, given response to therapy elsewhere. 3. More cephalad right upper lobe mild ground-glass and septal thickening are most likely radiation induced. The area of consolidation is felt greater than typical for radiation change and the tree-in-bud nodularity is not typical of radiation change. 4. Widespread sclerotic osseous metastasis are new since the prior and most likely represent treated previously CT occult lesions. 5. Coronary artery atherosclerosis. Aortic Atherosclerosis (ICD10-I70.0). CT ABDOMEN AND PELVIS IMPRESSION 1. Response to therapy of left adrenal and omental/peritoneal metastasis. 2. Colonic diverticulosis with mild pericolonic edema adjacent the descending colon, favoring noncomplicated diverticulitis. 3. A left external iliac node is small but has enlarged since the prior. Indeterminate. Recommend attention on follow-up. Electronically Signed   By: Jeronimo Greaves M.D.   On: 12/15/2022 10:52   NM Bone Scan Whole Body  Result Date: 12/08/2022 CLINICAL DATA:  Metastatic RIGHT lung cancer, LEFT femur pain for 4 weeks EXAM: NUCLEAR MEDICINE WHOLE BODY BONE SCAN TECHNIQUE: Whole body anterior and posterior images were obtained approximately 3 hours after intravenous injection of radiopharmaceutical. RADIOPHARMACEUTICALS:  21.41 mCi Technetium-96m MDP IV COMPARISON:  09/16/2022 Radiographic correlation: None since prior imaging FINDINGS: Multiple sites of abnormal osseous tracer accumulation consistent with osseous metastatic disease. These include calvarium, sternum, BILATERAL ribs, RIGHT scapula, thoracolumbar spine, pelvis, BILATERAL humeri, BILATERAL femora. When compared to prior exam, new and or progressive foci of uptake are seen at multiple sites. Expected urinary tract and soft tissue distribution of tracer. Orthopedic hardware RIGHT femur. IMPRESSION: Progressive osseous metastatic  disease. Electronically Signed   By: Ulyses Southward M.D.   On: 12/08/2022 15:57     Assessment and plan- Patient is a 57 y.o. female with metastatic lung cancer on sotorasib here to discuss CT scan results and further management  I have reviewed CT chest abdomen And pelvis and chest as well as bone scan images independently and discussed findings with the patient.  CT scan shows overall reduction in the size of tumor burden and improvement in the size of pulmonary nodules thoracic adenopathy as well as peritoneal metastases.  However bone scan shows new areas of uptake in the spine as well as left femur concerning for disease progression.  I will review her case at tumor board as well.  1 to make sure that none of these areas which are now conspicuous on the bone scan indicate areas of previous disease which are more conspicuous because of treatment.  If it is conclusively proven that this is disease progression I will switch her to CarboTaxol a Avastin regimen.  Also obtaining left hip x-rays and refer her to radiation oncology for ongoing left hip pain  Patient has an upcoming wedding coming up and she will continue to stay on sotorasib for now.  I will see her back tentatively in 2 to 3 weeks time to assess her ongoing pain.  Bone metastases: Patient last received Zometa in February 2024 and will now receive her next dose end of May 2024.   Visit Diagnosis 1. Pain of left femur   2. Primary malignant neoplasm of right lung metastatic to other site   3. Neoplasm related pain   4. Goals of care, counseling/discussion      Dr. Owens Shark, MD, MPH [facility] at [facility] [phone] 12/27/2022 8:59 AM

## 2022-12-28 ENCOUNTER — Encounter: Payer: Self-pay | Admitting: *Deleted

## 2022-12-28 ENCOUNTER — Encounter: Payer: Self-pay | Admitting: Oncology

## 2022-12-28 ENCOUNTER — Inpatient Hospital Stay (HOSPITAL_BASED_OUTPATIENT_CLINIC_OR_DEPARTMENT_OTHER): Payer: 59 | Admitting: Oncology

## 2022-12-28 DIAGNOSIS — G893 Neoplasm related pain (acute) (chronic): Secondary | ICD-10-CM | POA: Diagnosis not present

## 2022-12-28 DIAGNOSIS — C3491 Malignant neoplasm of unspecified part of right bronchus or lung: Secondary | ICD-10-CM

## 2022-12-28 DIAGNOSIS — Z7189 Other specified counseling: Secondary | ICD-10-CM

## 2022-12-28 MED ORDER — HYDROMORPHONE HCL 2 MG PO TABS: 1.0000 mg | ORAL_TABLET | Freq: Four times a day (QID) | ORAL | 0 refills | Status: DC | PRN

## 2022-12-28 NOTE — Progress Notes (Signed)
I connected with Gina Sanchez on 12/28/22 at  9:15 AM EDT by video enabled telemedicine visit and verified that I am speaking with the correct person using two identifiers.   I discussed the limitations, risks, security and privacy concerns of performing an evaluation and management service by telemedicine and the availability of in-person appointments. I also discussed with the patient that there may be a patient responsible charge related to this service. The patient expressed understanding and agreed to proceed.  Other persons participating in the visit and their role in the encounter:  none  Patient's location:  home Provider's location:  work  Stage manager Complaint: Routine follow-up of ongoing back pain  Diagnosis: Metastatic adenocarcinoma of the lung  History of present illness: Patient is a 57 year old female with a past medical history significant for hypertension hyperlipidemia and anxiety who presented with right thigh pain and was found to have an acute right proximal femoral shaft fracture.  She underwent operative fixation on 10/12/2020.  MRI of femur showed heterogeneously enhancing osseous lesion within the area which would be nonspecific versus office neoplasm or metastatic lesion.  CT chest abdomen and pelvis with contrast showed an enlarged pretracheal lymph node 2.2 x 1.5 cm and a right paratracheal lymph node measuring 2.7 x 1.3 cm.  Prevascular node measuring 0.3 x 0.9 cm.  5 x 4 mm right middle lobe nodule.  2.8 x 1.9 cm left adrenal lesion.    Reamings from the right femur showed metastatic poorly differentiated carcinoma.  Immunohistochemistry showed was positive for pancytokeratin, CK7 and patchy CK20 with patchy dim expression of TTF-1.  Cells negative for Melan-A, CDX2, PAX8, Napsin A, GATA3, p40, CD56, p16 and thyroglobulin.  Findings compatible with metastatic carcinoma but because of decalcification immunohistochemical staining is unreliable.  Patchy dim staining with TTF-1  suspicious for lung primary but not a definitive diagnosis.   Repeat supraclavicular lymph node biopsy showed metastatic adenocarcinoma.  Tumor cells positive for CK7 with focal weak staining for TTF-1.  Suggestive of lung origin in the proper clinical context.  Cells were negative for GATA3 PAX8 CDX2 and CK20 and Napsin A.  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  She is currently on hydrocortisone twice daily.  Thyroid functions presently are normal     Patient was treated with Ledell Noss Alimta Keytruda chemotherapy first-line followed by maintenance Alimta and Keytruda.  She developed autoimmune colitis secondary to [facility] and therefore that was stopped.  She is on Entyvio and follows up with GI for her autoimmune colitis.  Most recent regimen was maintenance Alimta. Disease progression in Jan 2024. Patients started on sotorasib.  Presently on full dose 960 mg daily  Interval history: patient is currently on OxyContin 20 mg twice daily and as needed oxycodone 20 mg every 4 hours as needed.  Pain waxes and wanes but on most days it is not well-controlled on this regimen.  Presently her left hip pain has improved somewhat but she does complain of some ongoing back pain.  Cough has resolved.  Overall appetite is good   Review of Systems  Constitutional:  Positive for malaise/fatigue. Negative for chills, fever and weight loss.  HENT:  Negative for congestion, ear discharge and nosebleeds.   Eyes:  Negative for blurred vision.  Respiratory:  Negative for cough, hemoptysis, sputum production, shortness of breath and wheezing.   Cardiovascular:  Negative for chest pain, palpitations, orthopnea and claudication.  Gastrointestinal:  Negative for abdominal pain, blood in stool, constipation, diarrhea, heartburn, melena, nausea and vomiting.  Genitourinary:  Negative  for dysuria, flank pain, frequency, hematuria and urgency.  Musculoskeletal:  Positive for back pain. Negative for joint pain and myalgias.  Skin:  Negative for rash.  Neurological:  Negative for dizziness, tingling, focal weakness, seizures, weakness and headaches.  Endo/Heme/Allergies:  Does not bruise/bleed easily.  Psychiatric/Behavioral:  Negative for depression and suicidal ideas. The patient does not have insomnia.     Allergies  Allergen Reactions   Factive [Gemifloxacin] Rash    Past Medical History:  Diagnosis Date   Abnormal Pap smear of cervix    Anxiety    Depression    Diverticulosis    Essential hypertension    Femur fracture, right    GERD (gastroesophageal reflux disease)    Lung cancer    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   Palpitations    Precordial chest pain    Wrist fracture 03/2021   left    Past Surgical History:  Procedure Laterality Date   BREAST BIOPSY Right 2017   benign   CERVICAL BIOPSY  W/ LOOP ELECTRODE EXCISION     COLONOSCOPY  06/11/2022   Procedure: COLONOSCOPY;  Surgeon: Toney Reil, MD;  Location: [facility] ENDOSCOPY;  Service: Gastroenterology;;   COLONOSCOPY WITH PROPOFOL N/A 07/03/2015   Procedure: COLONOSCOPY WITH PROPOFOL;  Surgeon: Wallace Cullens, MD;  Location: [facility] ENDOSCOPY;  Service: Gastroenterology;  Laterality: N/A;   COLONOSCOPY WITH PROPOFOL N/A 01/06/2022   Procedure: COLONOSCOPY WITH PROPOFOL;  Surgeon: Toney Reil, MD;  Location: [facility] ENDOSCOPY;  Service: Gastroenterology;  Laterality: N/A;   ESOPHAGOGASTRODUODENOSCOPY (EGD) WITH PROPOFOL N/A 07/03/2015   Procedure: ESOPHAGOGASTRODUODENOSCOPY (EGD) WITH PROPOFOL;  Surgeon: Wallace Cullens, MD;  Location: [facility] ENDOSCOPY;  Service: Gastroenterology;  Laterality: N/A;   FLEXIBLE SIGMOIDOSCOPY N/A 03/12/2022   Procedure: FLEXIBLE SIGMOIDOSCOPY;  Surgeon: Toney Reil, MD;  Location: [facility] ENDOSCOPY;  Service: Gastroenterology;  Laterality: N/A;   FLEXIBLE  SIGMOIDOSCOPY N/A 08/17/2022   Procedure: FLEXIBLE SIGMOIDOSCOPY;  Surgeon: Toney Reil, MD;  Location[facility] ENDOSCOPY;  Service: Gastroenterology;  Laterality: N/A;   FRACTURE SURGERY     INTRAMEDULLARY (IM) NAIL INTERTROCHANTERIC Right 09/15/2020   Procedure: INTRAMEDULLARY (IM) NAIL INTERTROCHANTRIC;  Surgeon: Christena Flake, MD;  Location: [facility] ORS;  Service: Orthopedics;  Laterality: Right;   IR CV LINE INJECTION  07/14/2022   IR IMAGING GUIDED PORT INSERTION  11/28/2020   LEFT HEART CATH AND CORONARY ANGIOGRAPHY N/A 03/28/2017   Procedure: Left Heart Cath and Coronary Angiography;  Surgeon: Runell Gess, MD;  [facility];  Service: Cardiovascular;  Laterality: N/A;   ORIF WRIST FRACTURE Left 03/31/2021   Procedure: OPEN REDUCTION INTERNAL FIXATION (ORIF) LEFT DISTAL RADIUS FRACTURE.;  Surgeon: Christena Flake, MD;  Location: [facility] ORS;  Service: Orthopedics;  Laterality: Left;    Social History   Socioeconomic History   Marital status: Married    Spouse name: Gene   Number of children: 2   Years of education: Not on file   Highest education level: Not on file  Occupational History   Not on file  Tobacco Use   Smoking status: Former    Packs/day: 1.00    Years: 30.00    Additional pack years: 0.00    Total pack years: 30.00    Types: Cigarettes    Quit date: 09/17/2020  Years since quitting: 2.2   Smokeless tobacco: Never  Vaping Use   Vaping Use: Never used  Substance and Sexual Activity   Alcohol use: Not Currently    Alcohol/week: 1.0 standard drink of alcohol    Types: 1 Standard drinks or equivalent per week    Comment: beer occ.   Drug use: No   Sexual activity: Yes    Birth control/protection: Post-menopausal  Other Topics Concern   Not on file  Social History Narrative   Lives in [place] with her husband.  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   Social Determinants of Health    Financial Resource Strain: Not on file  Food Insecurity: Not on file  Transportation Needs: No Transportation Needs (09/20/2022)   PRAPARE - Administrator, Civil Service (Medical): No    Lack of Transportation (Non-Medical): No  Physical Activity: Not on file  Stress: Not on file  Social Connections: Not on file  Intimate Partner Violence: Not on file    Family History  Problem Relation Age of Onset   Diabetes Mother        alive @ 42   Goiter Mother    Heart disease Father        died of MI @ 11   Osteoporosis Maternal Grandmother    Colon cancer Maternal Grandfather    Breast cancer Neg Hx    Ovarian cancer Neg Hx      Current Outpatient Medications:    HYDROmorphone (DILAUDID) 2 MG tablet, Take 0.5 tablets (1 mg total) by mouth every 6 (six) hours as needed for severe pain., Disp: 120 tablet, Rfl: 0   acetaminophen (TYLENOL) 500 MG tablet, Take 500 mg by mouth every 6 (six) hours as needed., Disp: , Rfl:    ALPRAZolam (XANAX) 0.5 MG tablet, TAKE ONE TABLET DAILY AS NEEDED, Disp: 30 tablet, Rfl: 0   amoxicillin-clavulanate (AUGMENTIN) 875-125 MG tablet, Take 1 tablet by mouth 2 (two) times daily., Disp: 14 tablet, Rfl: 0   citalopram (CELEXA) 10 MG tablet, Take 10 mg by mouth daily., Disp: , Rfl:    dicyclomine (BENTYL) 10 MG capsule, Take 1 capsule (10 mg total) by mouth every 8 (eight) hours as needed for spasms., Disp: 30 capsule, Rfl: 0   hydrocortisone (CORTEF) 10 MG tablet, TAKE 1 TABLET BY MOUTH NIGHTLY, Disp: 30 tablet, Rfl: 3   hydrocortisone (CORTEF) 20 MG tablet, TAKE 1 TABLET BY MOUTH EVERY MORNING, Disp: 30 tablet, Rfl: 3   LUMAKRAS 320 MG tablet, TAKE 3 TABLETS BY MOUTH 1 TIME A DAY, Disp: 90 tablet, Rfl: 1   metoprolol tartrate (LOPRESSOR) 25 MG tablet, Take 25 mg by mouth in the morning., Disp: , Rfl:    nitrofurantoin, macrocrystal-monohydrate, (MACROBID) 100 MG capsule, Take 100 mg by mouth 2 (two) times daily. (Patient not taking: Reported on  12/17/2022), Disp: , Rfl:    ondansetron (ZOFRAN) 4 MG tablet, TAKE 1 TABLET BY MOUTH EVERY 8 HOURS AS NEEDED FOR NAUSEA AND VOMITING, Disp: 20 tablet, Rfl: 1   oxyCODONE (OXYCONTIN) 20 mg 12 hr tablet, Take 1 tablet (20 mg total) by mouth every 12 (twelve) hours., Disp: 30 tablet, Rfl: 0   prochlorperazine (COMPAZINE) 10 MG tablet, Take 1 tablet (10 mg total) by mouth every 6 (six) hours as needed for nausea or vomiting., Disp: 30 tablet, Rfl: 1   valACYclovir (VALTREX) 1000 MG tablet, Take 1 tablet (1,000 mg total) by mouth 3 (  three) times daily., Disp: 30 tablet, Rfl: 0   Vedolizumab (ENTYVIO IV), Inject 1 Dose into the vein as directed. Every 4 weeks at her home, Disp: , Rfl:  No current facility-administered medications for this visit.  Facility-Administered Medications Ordered in Other Visits:    sodium chloride flush (NS) 0.9 % injection 10 mL, 10 mL, Intravenous, PRN, Creig Hines, MD, 10 mL at 03/09/21 0906  DG FEMUR MIN 2 VIEWS LEFT  Result Date: 12/21/2022 CLINICAL DATA:  Metastatic lung cancer.  Left femur pain. EXAM: LEFT FEMUR 2 VIEWS COMPARISON:  Bone scan 12/08/2022. FINDINGS: Sclerotic lesions are again noted throughout the left femur and pelvis consistent with known metastatic disease. Similar findings noted on prior recent bone scan. No acute abnormalities identified. No evidence of acute fracture or dislocation. Mild peripheral vascular calcification. IMPRESSION: Sclerotic lesions are again noted throughout the left femur and pelvis consistent with known metastatic disease. Similar findings noted on prior recent bone scan. No acute abnormality identified. Electronically Signed   By: Maisie Fus  Register M.D.   On: 12/21/2022 06:37   DG Pelvis 1-2 Views  Result Date: 12/21/2022 CLINICAL DATA:  History of metastatic lung cancer.  Pain left femur. EXAM: PELVIS - 1-2 VIEW COMPARISON:  CT 12/15/2022.  Bone scan 12/08/2022. FINDINGS: Prominent sclerotic lesions are again noted throughout the  pelvis and proximal left femur consistent with known metastatic disease. Similar findings noted on prior recent bone scan. Degenerative changes lower lumbar spine and both hips. Postsurgical changes right femur noted. No evidence of acute fracture or dislocation. Pelvic calcifications consistent with phleboliths. IMPRESSION: 1. Prominent sclerotic lesions are again noted throughout the pelvis and proximal left femur consistent with known metastatic disease. Similar findings noted on prior recent bone scan. No evidence of acute fracture or dislocation. 2. Degenerative changes lower lumbar spine and both hips. Postsurgical changes right femur. Electronically Signed   By: Maisie Fus  Register M.D.   On: 12/21/2022 06:34   CT CHEST ABDOMEN PELVIS W CONTRAST  Result Date: 12/15/2022 CLINICAL DATA:  Left adrenal and right femoral metastasis. Recently stopped immunotherapy secondary to diarrhea. Radiation therapy in February. Cough. Ex-smoker. * Tracking Code: BO * EXAM: CT CHEST, ABDOMEN, AND PELVIS WITH CONTRAST TECHNIQUE: Multidetector CT imaging of the chest, abdomen and pelvis was performed following the standard protocol during bolus administration of intravenous contrast. RADIATION DOSE REDUCTION: This exam was performed according to the departmental dose-optimization program which includes automated exposure control, adjustment of the mA and/or kV according to patient size and/or use of iterative reconstruction technique. CONTRAST:  OMNIPAQUE IOHEXOL 300 MG/ML  SOLN COMPARISON:  Clinic note of 10/13/2022. 09/16/2022 chest abdomen and pelvic CTs. FINDINGS: CT CHEST FINDINGS Cardiovascular: Right Port-A-Cath terminates at the high right atrium. Aortic atherosclerosis. Normal heart size, without pericardial effusion. Lad and left circumflex coronary artery calcification. No central pulmonary embolism, on this non-dedicated study. Mediastinum/Nodes: 11 mm right thyroid nodule. Not clinically significant; no  follow-up imaging recommended (ref: J Am Coll Radiol. 2015 Feb;12(2): 143-50) The right paratracheal soft tissue fullness is ill-defined, difficult to quantify. Estimated at 1.2 x 1.7 cm on 18/2 versus 2.0 x 2.5 cm on the prior. 8 mm node on 25/2 is enlarged since the prior, where it measured 2-3 mm. Lungs/Pleura: No pleural fluid. Medial right upper and adjacent superior segment right lower lobe consolidation versus mass. This is new at 3.8 x 3.4 cm on 65/3. Surrounding ground-glass. More peripheral right lower lobe "tree-in-bud" nodularity, including on 90/3. Patchy medial right upper  lobe ground-glass. Right apical mild smooth septal thickening. The multiple, left greater than right pulmonary nodules have improved and resolved. Example pleural-based left lower lobe pulmonary nodule of 5 mm on 54/3 is less well-defined than on the prior where it measured 7 x 6 mm. Posterior left upper lobe 6 mm nodule on the prior has nearly completely resolved with minimal residual interstitial thickening on 40/3. Musculoskeletal: Development of multifocal sclerotic osseous metastasis, example within the right side of T12 at 4.0 cm on 57/2. CT ABDOMEN PELVIS FINDINGS Hepatobiliary: Subcentimeter hepatic dome cyst. No suspicious liver lesion or biliary abnormality. Pancreas: Normal, without mass or ductal dilatation. Spleen: Normal in size, without focal abnormality. Adrenals/Urinary Tract: Normal right adrenal gland. Left adrenal nodule measures 1.4 x 1.4 cm on 62/2 versus 1.9 x 1.7 cm on the prior. Bilateral renal too small to characterize lesions . In the absence of clinically indicated signs/symptoms require(s) no independent follow-up. No hydronephrosis. Normal urinary bladder. Stomach/Bowel: Normal stomach, without wall thickening. Scattered colonic diverticula. Mild pericolonic edema adjacent the descending colon including on 89/2. Normal terminal ileum and appendix. Normal small bowel. Vascular/Lymphatic: Aortic  atherosclerosis. No abdominal adenopathy. A left external iliac 7 mm node on 112/2 is new since the prior but not pathologic by size criteria. Reproductive: Normal uterus and adnexa. Other: No significant free fluid.  No free intraperitoneal air. Omental/peritoneal metastasis again identified. Example 6 mm omental nodule on 61/2 within the right paramidline abdomen. 9 mm on the prior exam (when remeasured). Left paramidline more caudal abdominal implant measures 6 mm on 78/2 versus 10 mm on the prior Musculoskeletal: Right proximal femur fixation. Multifocal new sclerotic osseous metastasis. Example left iliac at 5.4 cm on 100/2. IMPRESSION: CT CHEST IMPRESSION 1. Response to therapy of pulmonary nodules and thoracic adenopathy. 2. New consolidation within the posteromedial right upper and adjacent right lower lobe. Right lower lobe "tree-in-bud" nodularity. Favor interval pneumonia with infectious bronchiolitis. A developing mass with postobstructive pneumonitis could look similar but is felt less likely, given response to therapy elsewhere. 3. More cephalad right upper lobe mild ground-glass and septal thickening are most likely radiation induced. The area of consolidation is felt greater than typical for radiation change and the tree-in-bud nodularity is not typical of radiation change. 4. Widespread sclerotic osseous metastasis are new since the prior and most likely represent treated previously CT occult lesions. 5. Coronary artery atherosclerosis. Aortic Atherosclerosis (ICD10-I70.0). CT ABDOMEN AND PELVIS IMPRESSION 1. Response to therapy of left adrenal and omental/peritoneal metastasis. 2. Colonic diverticulosis with mild pericolonic edema adjacent the descending colon, favoring noncomplicated diverticulitis. 3. A left external iliac node is small but has enlarged since the prior. Indeterminate. Recommend attention on follow-up. Electronically Signed   By: Jeronimo Greaves M.D.   On: 12/15/2022 10:52   NM  Bone Scan Whole Body  Result Date: 12/08/2022 CLINICAL DATA:  Metastatic RIGHT lung cancer, LEFT femur pain for 4 weeks EXAM: NUCLEAR MEDICINE WHOLE BODY BONE SCAN TECHNIQUE: Whole body anterior and posterior images were obtained approximately 3 hours after intravenous injection of radiopharmaceutical. RADIOPHARMACEUTICALS:  21.41 mCi Technetium-51m MDP IV COMPARISON:  09/16/2022 Radiographic correlation: None since prior imaging FINDINGS: Multiple sites of abnormal osseous tracer accumulation consistent with osseous metastatic disease. These include calvarium, sternum, BILATERAL ribs, RIGHT scapula, thoracolumbar spine, pelvis, BILATERAL humeri, BILATERAL femora. When compared to prior exam, new and or progressive foci of uptake are seen at multiple sites. Expected urinary tract and soft tissue distribution of tracer. Orthopedic hardware RIGHT femur. IMPRESSION: Progressive  osseous metastatic disease. Electronically Signed   By: Ulyses Southward M.D.   On: 12/08/2022 15:57    No images are attached to the encounter.      Latest Ref Rng & Units 12/17/2022    2:20 PM  CMP  Glucose 70 - 99 mg/dL 161   BUN 6 - 20 mg/dL 12   Creatinine 0.96 - 1.00 mg/dL 0.45   Sodium 409 - 811 mmol/L 136   Potassium 3.5 - 5.1 mmol/L 3.7   Chloride 98 - 111 mmol/L 104   CO2 22 - 32 mmol/L 23   Calcium 8.9 - 10.3 mg/dL 9.2   Total Protein 6.5 - 8.1 g/dL 8.4   Total Bilirubin 0.3 - 1.2 mg/dL 0.5   Alkaline Phos 38 - 126 U/L 297   AST 15 - 41 U/L 19   ALT 0 - 44 U/L 13       Latest Ref Rng & Units 12/17/2022    2:20 PM  CBC  WBC 4.0 - 10.5 K/uL 11.1   Hemoglobin 12.0 - 15.0 g/dL 91.4   Hematocrit 78.2 - 46.0 % 42.6   Platelets 150 - 400 K/uL 371      Observation/objective: Appears in no acute distress over video visit today.  Breathing is nonlabored  Assessment and plan: Patient is a 57 year old female with metastatic adenocarcinoma of the lung currently on second line sotorasib.  This is a visit to discuss her  ongoing back pain  I have reviewed her bone scan images at tumor board last week which is concerning for disease progression especially in her spine.  Overall her disease in other areas including the lung lymph nodes and omentum is better after starting sotorasib.  I would like her to continue sotorasib at this time and I will obtain a short-term interval bone scan in 1 month's time.If there is a consistent progression on that scan as well I will switch her to third line carboplatin Taxol and Avastin regimen.  Neoplasm related pain: She will stay on OxyContin 20 mg twice daily and I will switch her to Dilaudid 1 mg p.o. every 6 as needed as she says that oxycodone is not helping her.  She has a telephone visit follow-up with NP Laurette Schimke next week as well.  Will slowly titrate her oral Dilaudid based on her pain level.  I am also referring her to radiation oncology at this time  Follow-up instructions: I will see her end of May as planned for her next dose of Zometa and to discuss bone scan results and further management  I discussed the assessment and treatment plan with the patient. The patient was provided an opportunity to ask questions and all were answered. The patient agreed with the plan and demonstrated an understanding of the instructions.   The patient was advised to call back or seek an in-person evaluation if the symptoms worsen or if the condition fails to improve as anticipated.  I provided 12 minutes of face-to-face video visit time during this encounter, and > 50% was spent counseling as documented under my assessment & plan.  Visit Diagnosis: 1. Goals of care, counseling/discussion   2. Neoplasm related pain   3. Primary malignant neoplasm of right lung metastatic to other site     Dr. Owens Shark, MD, MPH [facility] at [facility] Tel- [phone] 12/28/2022 12:39 PM

## 2022-12-28 NOTE — Addendum Note (Signed)
Addended by: Owens Shark C on: 12/28/2022 03:41 PM   Modules accepted: Orders

## 2022-12-29 ENCOUNTER — Other Ambulatory Visit: Payer: Self-pay | Admitting: Medical Oncology

## 2022-12-29 ENCOUNTER — Telehealth: Payer: Self-pay | Admitting: *Deleted

## 2022-12-29 ENCOUNTER — Other Ambulatory Visit: Payer: 59

## 2022-12-29 ENCOUNTER — Ambulatory Visit: Payer: 59 | Admitting: Oncology

## 2022-12-29 MED ORDER — ONDANSETRON HCL 4 MG PO TABS: ORAL_TABLET | ORAL | 1 refills | Status: DC

## 2022-12-29 NOTE — Telephone Encounter (Signed)
Pt called to say what meds for pain should she take. I told her that she will take long acting oxycontin - takes it every 12 hours each day. Then the Dilaudid would be her break through pain. I did tell Smith Robert that when pt. Uses med cutter and the pill is so little that she pushes down and it breaks in little pieces so she just takes 1 tablet at a time. Dr. Smith Robert is ok with that. Pt. Is good.

## 2022-12-30 ENCOUNTER — Encounter: Payer: Self-pay | Admitting: Radiation Oncology

## 2022-12-30 ENCOUNTER — Telehealth: Payer: Self-pay

## 2022-12-30 ENCOUNTER — Ambulatory Visit
Admission: RE | Admit: 2022-12-30 | Discharge: 2022-12-30 | Disposition: A | Discharge status: Home / Self Care | Payer: 59 | Source: Ambulatory Visit | Attending: Radiation Oncology | Admitting: Radiation Oncology

## 2022-12-30 VITALS — BP 103/80 | HR 92 | Temp 97.0°F | Ht 66.0 in | Wt 187.7 lb

## 2022-12-30 DIAGNOSIS — C3490 Malignant neoplasm of unspecified part of unspecified bronchus or lung: Secondary | ICD-10-CM | POA: Insufficient documentation

## 2022-12-30 DIAGNOSIS — C7951 Secondary malignant neoplasm of bone: Secondary | ICD-10-CM | POA: Diagnosis not present

## 2022-12-30 DIAGNOSIS — B379 Candidiasis, unspecified: Secondary | ICD-10-CM | POA: Diagnosis not present

## 2022-12-30 DIAGNOSIS — N762 Acute vulvitis: Secondary | ICD-10-CM | POA: Diagnosis not present

## 2022-12-30 DIAGNOSIS — Z510 Encounter for antineoplastic radiation therapy: Secondary | ICD-10-CM | POA: Diagnosis not present

## 2022-12-30 DIAGNOSIS — Z112 Encounter for screening for other bacterial diseases: Secondary | ICD-10-CM | POA: Diagnosis not present

## 2022-12-30 DIAGNOSIS — C3480 Malignant neoplasm of overlapping sites of unspecified bronchus and lung: Secondary | ICD-10-CM | POA: Diagnosis not present

## 2022-12-30 DIAGNOSIS — N390 Urinary tract infection, site not specified: Secondary | ICD-10-CM | POA: Diagnosis not present

## 2022-12-30 DIAGNOSIS — Z87891 Personal history of nicotine dependence: Secondary | ICD-10-CM | POA: Diagnosis not present

## 2022-12-30 NOTE — Telephone Encounter (Signed)
Carnisha with [facility] called to find out the diagnosis for the patient Entyvio. Gave her this information and asked if if the patient has been approved by her insurance to receive the medication. She states she has some confusion on it. Explained to her that I have her approved from 12/15/2022 to 12/15/23 on the medical side of insurance and on the pharmacy side of her insurance they have denied the medication but we have submitted a appeal. She states that makes sense now. In formed her they said the appeal should have a decision on 12/31/2022. She states she will follow up with them today to find out if a outcome has been made and it a outcome is made then she will give me a call back. If a outcome is still pending she will not call me back.

## 2022-12-30 NOTE — Progress Notes (Signed)
Radiation Oncology Follow up Note  Name: Gina Sanchez   Date:   12/30/2022 MRN:  [id number] DOB: 14-Nov-1965    This 57 y.o. female presents to the clinic today for evaluation of palliative radiation therapy to her L-spine SI joints and left femur in patient previously treated to her chest for SVC as well as right femur for stage IV adenocarcinoma lung.  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  She has been on multiple chemotherapy interventions including Alimta Keytruda and most recently sotorasib. Presently on full dose 960 mg daily.  She is on currently on narcotic analgesics for lower back pain as well as left femur pain.  Recent bone scan shows progression of osseous metastatic disease most notably in her lumbar spine SI joints as well as her left femur.  Her most recent CT scan shows response to therapy for pulmonary nodules and thoracic adenopathy.  COMPLICATIONS OF TREATMENT: none  FOLLOW UP COMPLIANCE: keeps appointments   PHYSICAL EXAM:  BP 103/80   Pulse 92   Temp (!) 97 F (36.1 C)   Ht 5\' 6"  (1.676 m)   Wt 187 lb 11.2 oz (85.1 kg)   LMP 09/15/2018   BMI 30.30 kg/m  Range of motion of the lower extremities does not elicit pain she does have some decreased strength in her left lower extremity mostly I believe guarding from her pain.  Proprioception appears intact.  Well-developed well-nourished patient in NAD. HEENT reveals PERLA, EOMI, discs not visualized.  Oral cavity is clear. No oral mucosal lesions are identified. Neck is clear without evidence of cervical or supraclavicular adenopathy. Lungs are clear to A&P. Cardiac examination is essentially unremarkable with regular rate and rhythm without murmur rub or thrill. Abdomen is benign with no  organomegaly or masses noted. Motor sensory and DTR levels are equal and symmetric in the upper and lower extremities. Cranial nerves II through XII are grossly intact. Proprioception is intact. No peripheral adenopathy or edema is identified. No motor or sensory levels are noted. Crude visual fields are within normal range.  RADIOLOGY RESULTS: Bone scan and CT scans reviewed compatible with above-stated findings  PLAN: Present time I have recommended palliative radiation therapy.  I think I can incorporate the L-spine SI joints and her left femur in 1 field although I would like to decrease the dose per fraction to 200 centigrade and treat to 330 Gray in 15 fractions based on extremely large treatment field.  Risks and benefits of treatment including skin reaction fatigue possible diarrhea alteration of blood counts all were described in detail to the patient and her husband.  I have personally set up and ordered CT simulation for tomorrow.  I would like to take this opportunity to thank you for allowing me to participate in the care of your patient.Carmina Miller, MD

## 2022-12-31 ENCOUNTER — Ambulatory Visit
Admission: RE | Admit: 2022-12-31 | Discharge: 2022-12-31 | Disposition: A | Discharge status: Home / Self Care | Payer: 59 | Source: Ambulatory Visit | Attending: Radiation Oncology | Admitting: Radiation Oncology

## 2022-12-31 ENCOUNTER — Telehealth: Payer: Self-pay

## 2022-12-31 DIAGNOSIS — Z510 Encounter for antineoplastic radiation therapy: Secondary | ICD-10-CM | POA: Diagnosis not present

## 2022-12-31 DIAGNOSIS — C7951 Secondary malignant neoplasm of bone: Secondary | ICD-10-CM | POA: Diagnosis not present

## 2022-12-31 DIAGNOSIS — Z87891 Personal history of nicotine dependence: Secondary | ICD-10-CM | POA: Diagnosis not present

## 2022-12-31 DIAGNOSIS — C3490 Malignant neoplasm of unspecified part of unspecified bronchus or lung: Secondary | ICD-10-CM | POA: Diagnosis not present

## 2022-12-31 DIAGNOSIS — C3480 Malignant neoplasm of overlapping sites of unspecified bronchus and lung: Secondary | ICD-10-CM | POA: Diagnosis not present

## 2022-12-31 NOTE — Telephone Encounter (Signed)
-----   Message from Denman George, [place] sent at 12/28/2022  8:41 AM EDT -----  ----- Message ----- From: Denman George, CMA Sent: 12/28/2022  12:00 AM EDT To: Denman George, CMA   ----- Message ----- From: Denman George, CMA Sent: 12/27/2022  12:00 AM EDT To: Denman George, CMA  Check on pa

## 2022-12-31 NOTE — Telephone Encounter (Signed)
Called [employer] [facility] to check on the appeal on the Entyvio. I talk to Dorathy Kinsman in the appeal department and they state it is processing and a determination letter should be going out by the end of the day. Case ID [id number]

## 2023-01-03 ENCOUNTER — Inpatient Hospital Stay (HOSPITAL_BASED_OUTPATIENT_CLINIC_OR_DEPARTMENT_OTHER): Payer: 59 | Admitting: Hospice and Palliative Medicine

## 2023-01-03 DIAGNOSIS — G893 Neoplasm related pain (acute) (chronic): Secondary | ICD-10-CM

## 2023-01-03 DIAGNOSIS — Z7189 Other specified counseling: Secondary | ICD-10-CM | POA: Diagnosis not present

## 2023-01-03 DIAGNOSIS — Z515 Encounter for palliative care: Secondary | ICD-10-CM

## 2023-01-03 MED ORDER — HYDROMORPHONE HCL 2 MG PO TABS: 2.0000 mg | ORAL_TABLET | ORAL | 0 refills | Status: DC | PRN

## 2023-01-03 NOTE — Telephone Encounter (Signed)
Will call [facility].

## 2023-01-03 NOTE — Telephone Encounter (Signed)
Called to check the status of the etyvio The determination letter was mailed on 01/01/2023.  They are saying the Entyvio injection is still denied they did not approved the medication. Informed her it was not for the injection it is for the infusion. Asked if there was someone to talk to and she said no there is not one number on the letter. States the medical side should approved the medication.  Code [id number]   Reference number [id number]

## 2023-01-03 NOTE — Progress Notes (Signed)
Virtual Visit via Telephone Note  I connected with Gina Sanchez on 01/03/23 at  3:00 PM EDT by telephone and verified that I am speaking with the correct person using two identifiers.  Location: Patient: Home Provider: Clinic   I discussed the limitations, risks, security and privacy concerns of performing an evaluation and management service by telephone and the availability of in person appointments. I also discussed with the patient that there may be a patient responsible charge related to this service. The patient expressed understanding and agreed to proceed.   History of Present Illness: Gina Sanchez is a 57 y.o. 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      Observations/Objective: CT of the chest abdomen pelvis on 12/15/2022 showed mixed response with improved pulmonary nodules and thoracic adenopathy but new widespread sclerotic osseous mets.  I spoke with patient by phone.  Patient has had worse back and hip pain likely in the setting of progressive osseous metastasis.  She recently saw Dr. Smith Robert who rotated to oral hydromorphone.  Patient was initially prescribed hydromorphone 1 mg but says that she has had significant difficulty cutting the tablets and is therefore wasted many tablets.  Patient says that she decided to just take a full 2 mg tablet and has found some improvement but states that the effect is wearing off quickly.  Patient is taking 1 tablet about every 5 hours.  She denies any adverse effects from pain medications.  Patient is planning to start XRT later this week.  Assessment and Plan: Neoplasm related pain -continue OxyContin.  Will liberalize hydromorphone to 2 to 4 mg every 4 hours as needed for breakthrough pain.  Daily bowel regimen.  Hopefully, pain will improve once patient begins XRT.  Follow Up Instructions: Follow-up telephone visit 2 to 3 weeks   I discussed  the assessment and treatment plan with the patient. The patient was provided an opportunity to ask questions and all were answered. The patient agreed with the plan and demonstrated an understanding of the instructions.   The patient was advised to call back or seek an in-person evaluation if the symptoms worsen or if the condition fails to improve as anticipated.  I provided 10 minutes of non-face-to-face time during this encounter.   Malachy Moan, NP

## 2023-01-04 ENCOUNTER — Telehealth: Payer: Self-pay | Admitting: Gastroenterology

## 2023-01-04 ENCOUNTER — Other Ambulatory Visit: Payer: Self-pay | Admitting: *Deleted

## 2023-01-04 ENCOUNTER — Ambulatory Visit: Payer: 59 | Admitting: Gastroenterology

## 2023-01-04 ENCOUNTER — Other Ambulatory Visit: Payer: Self-pay

## 2023-01-04 DIAGNOSIS — Z510 Encounter for antineoplastic radiation therapy: Secondary | ICD-10-CM | POA: Diagnosis not present

## 2023-01-04 DIAGNOSIS — C7951 Secondary malignant neoplasm of bone: Secondary | ICD-10-CM | POA: Diagnosis not present

## 2023-01-04 DIAGNOSIS — C3480 Malignant neoplasm of overlapping sites of unspecified bronchus and lung: Secondary | ICD-10-CM | POA: Diagnosis not present

## 2023-01-04 DIAGNOSIS — C3490 Malignant neoplasm of unspecified part of unspecified bronchus or lung: Secondary | ICD-10-CM | POA: Diagnosis not present

## 2023-01-04 DIAGNOSIS — Z87891 Personal history of nicotine dependence: Secondary | ICD-10-CM | POA: Diagnosis not present

## 2023-01-04 MED ORDER — HYDROMORPHONE HCL 2 MG PO TABS
2.0000 mg | ORAL_TABLET | ORAL | 0 refills | Status: DC | PRN
  Filled 2023-01-04: qty 100, 9d supply, fill #0
  Filled 2023-01-05: qty 20, 1d supply, fill #0

## 2023-01-04 NOTE — Telephone Encounter (Signed)
Called and gave patient a update on the [facility] approval. Informed her if she has not heard anything from [facility] about scheduling her infusion by Thursday to let me know and I will call them

## 2023-01-04 NOTE — Telephone Encounter (Signed)
Per patient, pharmacy said that there are no pharmacies in [place] that have Dilaudid and that it is on back order. She is asking what she is supposed to do. Please advise  I also checked with our pharmacy and they do not have any either

## 2023-01-04 NOTE — Telephone Encounter (Signed)
Pt would like a call back to discuss her next appointment

## 2023-01-04 NOTE — Telephone Encounter (Signed)
Called patient and patient was canceled because we got her in to the office sooner. Made her 6 month follow up appointment with Dr. Allegra Lai

## 2023-01-05 ENCOUNTER — Ambulatory Visit: Admission: RE | Admit: 2023-01-05 | Payer: 59 | Source: Ambulatory Visit

## 2023-01-05 ENCOUNTER — Ambulatory Visit: Payer: 59 | Admitting: Radiation Oncology

## 2023-01-05 ENCOUNTER — Other Ambulatory Visit: Payer: Self-pay

## 2023-01-05 DIAGNOSIS — Z87891 Personal history of nicotine dependence: Secondary | ICD-10-CM | POA: Diagnosis not present

## 2023-01-05 DIAGNOSIS — C3480 Malignant neoplasm of overlapping sites of unspecified bronchus and lung: Secondary | ICD-10-CM | POA: Diagnosis not present

## 2023-01-05 DIAGNOSIS — Z510 Encounter for antineoplastic radiation therapy: Secondary | ICD-10-CM | POA: Diagnosis not present

## 2023-01-05 DIAGNOSIS — C7951 Secondary malignant neoplasm of bone: Secondary | ICD-10-CM | POA: Diagnosis not present

## 2023-01-05 DIAGNOSIS — C3490 Malignant neoplasm of unspecified part of unspecified bronchus or lung: Secondary | ICD-10-CM | POA: Diagnosis not present

## 2023-01-06 ENCOUNTER — Ambulatory Visit
Admission: RE | Admit: 2023-01-06 | Discharge: 2023-01-06 | Disposition: A | Discharge status: Home / Self Care | Payer: 59 | Source: Ambulatory Visit | Attending: Radiation Oncology | Admitting: Radiation Oncology

## 2023-01-06 ENCOUNTER — Other Ambulatory Visit: Payer: Self-pay

## 2023-01-06 DIAGNOSIS — Z510 Encounter for antineoplastic radiation therapy: Secondary | ICD-10-CM | POA: Diagnosis not present

## 2023-01-06 DIAGNOSIS — C7951 Secondary malignant neoplasm of bone: Secondary | ICD-10-CM | POA: Diagnosis not present

## 2023-01-06 DIAGNOSIS — C3490 Malignant neoplasm of unspecified part of unspecified bronchus or lung: Secondary | ICD-10-CM | POA: Diagnosis not present

## 2023-01-06 LAB — RAD ONC ARIA SESSION SUMMARY
Course Elapsed Days: 0
Plan Fractions Treated to Date: 1
Plan Prescribed Dose Per Fraction: 2 Gy
Plan Total Fractions Prescribed: 15
Plan Total Prescribed Dose: 30 Gy
Reference Point Dosage Given to Date: 2 Gy
Reference Point Session Dosage Given: 2 Gy
Session Number: 1

## 2023-01-07 ENCOUNTER — Other Ambulatory Visit: Payer: Self-pay

## 2023-01-07 ENCOUNTER — Ambulatory Visit
Admission: RE | Admit: 2023-01-07 | Discharge: 2023-01-07 | Disposition: A | Discharge status: Home / Self Care | Payer: 59 | Source: Ambulatory Visit | Attending: Radiation Oncology | Admitting: Radiation Oncology

## 2023-01-07 DIAGNOSIS — C7951 Secondary malignant neoplasm of bone: Secondary | ICD-10-CM | POA: Diagnosis not present

## 2023-01-07 DIAGNOSIS — Z510 Encounter for antineoplastic radiation therapy: Secondary | ICD-10-CM | POA: Diagnosis not present

## 2023-01-07 DIAGNOSIS — C3490 Malignant neoplasm of unspecified part of unspecified bronchus or lung: Secondary | ICD-10-CM | POA: Diagnosis not present

## 2023-01-07 LAB — RAD ONC ARIA SESSION SUMMARY
Course Elapsed Days: 1
Plan Fractions Treated to Date: 2
Plan Prescribed Dose Per Fraction: 2 Gy
Plan Total Fractions Prescribed: 15
Plan Total Prescribed Dose: 30 Gy
Reference Point Dosage Given to Date: 4 Gy
Reference Point Session Dosage Given: 2 Gy
Session Number: 2

## 2023-01-10 ENCOUNTER — Other Ambulatory Visit: Payer: Self-pay | Admitting: Oncology

## 2023-01-10 ENCOUNTER — Other Ambulatory Visit: Payer: Self-pay

## 2023-01-10 ENCOUNTER — Ambulatory Visit
Admission: RE | Admit: 2023-01-10 | Discharge: 2023-01-10 | Disposition: A | Discharge status: Home / Self Care | Payer: 59 | Source: Ambulatory Visit | Attending: Radiation Oncology | Admitting: Radiation Oncology

## 2023-01-10 DIAGNOSIS — C3490 Malignant neoplasm of unspecified part of unspecified bronchus or lung: Secondary | ICD-10-CM | POA: Diagnosis not present

## 2023-01-10 DIAGNOSIS — C7951 Secondary malignant neoplasm of bone: Secondary | ICD-10-CM | POA: Diagnosis not present

## 2023-01-10 DIAGNOSIS — C3491 Malignant neoplasm of unspecified part of right bronchus or lung: Secondary | ICD-10-CM

## 2023-01-10 DIAGNOSIS — Z510 Encounter for antineoplastic radiation therapy: Secondary | ICD-10-CM | POA: Diagnosis not present

## 2023-01-10 LAB — RAD ONC ARIA SESSION SUMMARY
Course Elapsed Days: 4
Plan Fractions Treated to Date: 3
Plan Prescribed Dose Per Fraction: 2 Gy
Plan Total Fractions Prescribed: 15
Plan Total Prescribed Dose: 30 Gy
Reference Point Dosage Given to Date: 6 Gy
Reference Point Session Dosage Given: 2 Gy
Session Number: 3

## 2023-01-11 ENCOUNTER — Other Ambulatory Visit: Payer: Self-pay | Admitting: Oncology

## 2023-01-11 ENCOUNTER — Other Ambulatory Visit: Payer: Self-pay | Admitting: *Deleted

## 2023-01-11 ENCOUNTER — Ambulatory Visit
Admission: RE | Admit: 2023-01-11 | Discharge: 2023-01-11 | Disposition: A | Discharge status: Home / Self Care | Payer: 59 | Source: Ambulatory Visit | Attending: Radiation Oncology | Admitting: Radiation Oncology

## 2023-01-11 ENCOUNTER — Encounter: Payer: Self-pay | Admitting: Oncology

## 2023-01-11 ENCOUNTER — Other Ambulatory Visit: Payer: Self-pay

## 2023-01-11 DIAGNOSIS — Z510 Encounter for antineoplastic radiation therapy: Secondary | ICD-10-CM | POA: Diagnosis not present

## 2023-01-11 DIAGNOSIS — C7951 Secondary malignant neoplasm of bone: Secondary | ICD-10-CM | POA: Diagnosis not present

## 2023-01-11 DIAGNOSIS — C3490 Malignant neoplasm of unspecified part of unspecified bronchus or lung: Secondary | ICD-10-CM | POA: Diagnosis not present

## 2023-01-11 DIAGNOSIS — C3491 Malignant neoplasm of unspecified part of right bronchus or lung: Secondary | ICD-10-CM

## 2023-01-11 LAB — RAD ONC ARIA SESSION SUMMARY
Course Elapsed Days: 5
Plan Fractions Treated to Date: 4
Plan Prescribed Dose Per Fraction: 2 Gy
Plan Total Fractions Prescribed: 15
Plan Total Prescribed Dose: 30 Gy
Reference Point Dosage Given to Date: 8 Gy
Reference Point Session Dosage Given: 2 Gy
Session Number: 4

## 2023-01-11 MED ORDER — SOTORASIB 320 MG PO TABS: 960.0000 mg | ORAL_TABLET | Freq: Every day | ORAL | 1 refills | Status: DC

## 2023-01-12 ENCOUNTER — Other Ambulatory Visit: Payer: Self-pay | Admitting: *Deleted

## 2023-01-12 ENCOUNTER — Other Ambulatory Visit: Payer: Self-pay

## 2023-01-12 ENCOUNTER — Ambulatory Visit
Admission: RE | Admit: 2023-01-12 | Discharge: 2023-01-12 | Disposition: A | Discharge status: Home / Self Care | Payer: 59 | Source: Ambulatory Visit | Attending: Radiation Oncology | Admitting: Radiation Oncology

## 2023-01-12 DIAGNOSIS — C801 Malignant (primary) neoplasm, unspecified: Secondary | ICD-10-CM | POA: Insufficient documentation

## 2023-01-12 DIAGNOSIS — K529 Noninfective gastroenteritis and colitis, unspecified: Secondary | ICD-10-CM | POA: Diagnosis not present

## 2023-01-12 DIAGNOSIS — C3480 Malignant neoplasm of overlapping sites of unspecified bronchus and lung: Secondary | ICD-10-CM | POA: Diagnosis not present

## 2023-01-12 DIAGNOSIS — C7972 Secondary malignant neoplasm of left adrenal gland: Secondary | ICD-10-CM | POA: Diagnosis not present

## 2023-01-12 DIAGNOSIS — C3491 Malignant neoplasm of unspecified part of right bronchus or lung: Secondary | ICD-10-CM | POA: Diagnosis not present

## 2023-01-12 DIAGNOSIS — C786 Secondary malignant neoplasm of retroperitoneum and peritoneum: Secondary | ICD-10-CM | POA: Diagnosis not present

## 2023-01-12 DIAGNOSIS — C770 Secondary and unspecified malignant neoplasm of lymph nodes of head, face and neck: Secondary | ICD-10-CM | POA: Diagnosis not present

## 2023-01-12 DIAGNOSIS — Z510 Encounter for antineoplastic radiation therapy: Secondary | ICD-10-CM | POA: Insufficient documentation

## 2023-01-12 DIAGNOSIS — C7800 Secondary malignant neoplasm of unspecified lung: Secondary | ICD-10-CM | POA: Insufficient documentation

## 2023-01-12 DIAGNOSIS — G893 Neoplasm related pain (acute) (chronic): Secondary | ICD-10-CM | POA: Diagnosis not present

## 2023-01-12 DIAGNOSIS — C7951 Secondary malignant neoplasm of bone: Secondary | ICD-10-CM | POA: Insufficient documentation

## 2023-01-12 DIAGNOSIS — R630 Anorexia: Secondary | ICD-10-CM | POA: Diagnosis not present

## 2023-01-12 DIAGNOSIS — Z87891 Personal history of nicotine dependence: Secondary | ICD-10-CM | POA: Diagnosis not present

## 2023-01-12 DIAGNOSIS — Z800 Family history of malignant neoplasm of digestive organs: Secondary | ICD-10-CM | POA: Diagnosis not present

## 2023-01-12 LAB — RAD ONC ARIA SESSION SUMMARY
Course Elapsed Days: 6
Plan Fractions Treated to Date: 5
Plan Prescribed Dose Per Fraction: 2 Gy
Plan Total Fractions Prescribed: 15
Plan Total Prescribed Dose: 30 Gy
Reference Point Dosage Given to Date: 10 Gy
Reference Point Session Dosage Given: 2 Gy
Session Number: 5

## 2023-01-12 MED ORDER — OXYCODONE HCL ER 20 MG PO T12A: 20.0000 mg | EXTENDED_RELEASE_TABLET | Freq: Two times a day (BID) | ORAL | 0 refills | Status: DC

## 2023-01-13 ENCOUNTER — Other Ambulatory Visit: Payer: Self-pay

## 2023-01-13 ENCOUNTER — Ambulatory Visit
Admission: RE | Admit: 2023-01-13 | Discharge: 2023-01-13 | Disposition: A | Discharge status: Home / Self Care | Payer: 59 | Source: Ambulatory Visit | Attending: Radiation Oncology | Admitting: Radiation Oncology

## 2023-01-13 DIAGNOSIS — Z800 Family history of malignant neoplasm of digestive organs: Secondary | ICD-10-CM | POA: Diagnosis not present

## 2023-01-13 DIAGNOSIS — C3491 Malignant neoplasm of unspecified part of right bronchus or lung: Secondary | ICD-10-CM | POA: Diagnosis not present

## 2023-01-13 DIAGNOSIS — G893 Neoplasm related pain (acute) (chronic): Secondary | ICD-10-CM | POA: Diagnosis not present

## 2023-01-13 DIAGNOSIS — C770 Secondary and unspecified malignant neoplasm of lymph nodes of head, face and neck: Secondary | ICD-10-CM | POA: Diagnosis not present

## 2023-01-13 DIAGNOSIS — Z510 Encounter for antineoplastic radiation therapy: Secondary | ICD-10-CM | POA: Diagnosis not present

## 2023-01-13 DIAGNOSIS — K529 Noninfective gastroenteritis and colitis, unspecified: Secondary | ICD-10-CM | POA: Diagnosis not present

## 2023-01-13 DIAGNOSIS — C786 Secondary malignant neoplasm of retroperitoneum and peritoneum: Secondary | ICD-10-CM | POA: Diagnosis not present

## 2023-01-13 DIAGNOSIS — C7972 Secondary malignant neoplasm of left adrenal gland: Secondary | ICD-10-CM | POA: Diagnosis not present

## 2023-01-13 DIAGNOSIS — R630 Anorexia: Secondary | ICD-10-CM | POA: Diagnosis not present

## 2023-01-13 DIAGNOSIS — Z87891 Personal history of nicotine dependence: Secondary | ICD-10-CM | POA: Diagnosis not present

## 2023-01-13 DIAGNOSIS — C7951 Secondary malignant neoplasm of bone: Secondary | ICD-10-CM | POA: Diagnosis not present

## 2023-01-13 LAB — RAD ONC ARIA SESSION SUMMARY
Course Elapsed Days: 7
Plan Fractions Treated to Date: 6
Plan Prescribed Dose Per Fraction: 2 Gy
Plan Total Fractions Prescribed: 15
Plan Total Prescribed Dose: 30 Gy
Reference Point Dosage Given to Date: 12 Gy
Reference Point Session Dosage Given: 2 Gy
Session Number: 6

## 2023-01-14 ENCOUNTER — Other Ambulatory Visit: Payer: Self-pay

## 2023-01-14 ENCOUNTER — Ambulatory Visit
Admission: RE | Admit: 2023-01-14 | Discharge: 2023-01-14 | Disposition: A | Discharge status: Home / Self Care | Payer: 59 | Source: Ambulatory Visit | Attending: Radiation Oncology | Admitting: Radiation Oncology

## 2023-01-14 DIAGNOSIS — C7972 Secondary malignant neoplasm of left adrenal gland: Secondary | ICD-10-CM | POA: Diagnosis not present

## 2023-01-14 DIAGNOSIS — C786 Secondary malignant neoplasm of retroperitoneum and peritoneum: Secondary | ICD-10-CM | POA: Diagnosis not present

## 2023-01-14 DIAGNOSIS — C3491 Malignant neoplasm of unspecified part of right bronchus or lung: Secondary | ICD-10-CM | POA: Diagnosis not present

## 2023-01-14 DIAGNOSIS — C7951 Secondary malignant neoplasm of bone: Secondary | ICD-10-CM | POA: Diagnosis not present

## 2023-01-14 DIAGNOSIS — C770 Secondary and unspecified malignant neoplasm of lymph nodes of head, face and neck: Secondary | ICD-10-CM | POA: Diagnosis not present

## 2023-01-14 DIAGNOSIS — Z87891 Personal history of nicotine dependence: Secondary | ICD-10-CM | POA: Diagnosis not present

## 2023-01-14 DIAGNOSIS — Z800 Family history of malignant neoplasm of digestive organs: Secondary | ICD-10-CM | POA: Diagnosis not present

## 2023-01-14 DIAGNOSIS — Z510 Encounter for antineoplastic radiation therapy: Secondary | ICD-10-CM | POA: Diagnosis not present

## 2023-01-14 DIAGNOSIS — R630 Anorexia: Secondary | ICD-10-CM | POA: Diagnosis not present

## 2023-01-14 DIAGNOSIS — G893 Neoplasm related pain (acute) (chronic): Secondary | ICD-10-CM | POA: Diagnosis not present

## 2023-01-14 DIAGNOSIS — K529 Noninfective gastroenteritis and colitis, unspecified: Secondary | ICD-10-CM | POA: Diagnosis not present

## 2023-01-14 LAB — RAD ONC ARIA SESSION SUMMARY
Course Elapsed Days: 8
Plan Fractions Treated to Date: 7
Plan Prescribed Dose Per Fraction: 2 Gy
Plan Total Fractions Prescribed: 15
Plan Total Prescribed Dose: 30 Gy
Reference Point Dosage Given to Date: 14 Gy
Reference Point Session Dosage Given: 2 Gy
Session Number: 7

## 2023-01-17 ENCOUNTER — Other Ambulatory Visit: Payer: Self-pay

## 2023-01-17 ENCOUNTER — Telehealth: Payer: Self-pay | Admitting: Gastroenterology

## 2023-01-17 ENCOUNTER — Encounter: Payer: Self-pay | Admitting: Oncology

## 2023-01-17 ENCOUNTER — Inpatient Hospital Stay: Payer: 59 | Attending: Oncology | Admitting: Hospice and Palliative Medicine

## 2023-01-17 ENCOUNTER — Ambulatory Visit
Admission: RE | Admit: 2023-01-17 | Discharge: 2023-01-17 | Disposition: A | Discharge status: Home / Self Care | Payer: 59 | Source: Ambulatory Visit | Attending: Radiation Oncology | Admitting: Radiation Oncology

## 2023-01-17 DIAGNOSIS — G893 Neoplasm related pain (acute) (chronic): Secondary | ICD-10-CM

## 2023-01-17 DIAGNOSIS — Z515 Encounter for palliative care: Secondary | ICD-10-CM

## 2023-01-17 DIAGNOSIS — C3491 Malignant neoplasm of unspecified part of right bronchus or lung: Secondary | ICD-10-CM

## 2023-01-17 DIAGNOSIS — C7951 Secondary malignant neoplasm of bone: Secondary | ICD-10-CM | POA: Diagnosis not present

## 2023-01-17 DIAGNOSIS — Z510 Encounter for antineoplastic radiation therapy: Secondary | ICD-10-CM | POA: Diagnosis not present

## 2023-01-17 DIAGNOSIS — Z87891 Personal history of nicotine dependence: Secondary | ICD-10-CM | POA: Insufficient documentation

## 2023-01-17 DIAGNOSIS — C7972 Secondary malignant neoplasm of left adrenal gland: Secondary | ICD-10-CM | POA: Insufficient documentation

## 2023-01-17 DIAGNOSIS — C770 Secondary and unspecified malignant neoplasm of lymph nodes of head, face and neck: Secondary | ICD-10-CM | POA: Diagnosis not present

## 2023-01-17 DIAGNOSIS — Z800 Family history of malignant neoplasm of digestive organs: Secondary | ICD-10-CM | POA: Diagnosis not present

## 2023-01-17 DIAGNOSIS — K529 Noninfective gastroenteritis and colitis, unspecified: Secondary | ICD-10-CM | POA: Diagnosis not present

## 2023-01-17 DIAGNOSIS — R630 Anorexia: Secondary | ICD-10-CM | POA: Insufficient documentation

## 2023-01-17 DIAGNOSIS — C786 Secondary malignant neoplasm of retroperitoneum and peritoneum: Secondary | ICD-10-CM | POA: Diagnosis not present

## 2023-01-17 LAB — RAD ONC ARIA SESSION SUMMARY
Course Elapsed Days: 11
Plan Fractions Treated to Date: 8
Plan Prescribed Dose Per Fraction: 2 Gy
Plan Total Fractions Prescribed: 15
Plan Total Prescribed Dose: 30 Gy
Reference Point Dosage Given to Date: 16 Gy
Reference Point Session Dosage Given: 2 Gy
Session Number: 8

## 2023-01-17 MED ORDER — GABAPENTIN 100 MG PO CAPS
100.0000 mg | ORAL_CAPSULE | Freq: Three times a day (TID) | ORAL | 0 refills | Status: DC
  Filled 2023-01-17: qty 60, 20d supply, fill #0

## 2023-01-17 MED ORDER — LIDOCAINE 5 % EX PTCH
1.0000 | MEDICATED_PATCH | CUTANEOUS | 0 refills | Status: DC
  Filled 2023-01-17: qty 30, 30d supply, fill #0

## 2023-01-17 MED ORDER — HYDROMORPHONE HCL 2 MG PO TABS
2.0000 mg | ORAL_TABLET | ORAL | 0 refills | Status: DC | PRN
  Filled 2023-01-17: qty 120, 10d supply, fill #0

## 2023-01-17 NOTE — Progress Notes (Signed)
Virtual Visit via Telephone Note  I connected with Gina Sanchez on 01/17/23 at  3:20 PM EDT by telephone and verified that I am speaking with the correct person using two identifiers.  Location: Patient: Home Provider: Clinic   I discussed the limitations, risks, security and privacy concerns of performing an evaluation and management service by telephone and the availability of in person appointments. I also discussed with the patient that there may be a patient responsible charge related to this service. The patient expressed understanding and agreed to proceed.   History of Present Illness: Gina Sanchez is a 57 y.o. 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      Observations/Objective: CT of the chest abdomen pelvis on 12/15/2022 showed mixed response with improved pulmonary nodules and thoracic adenopathy but new widespread sclerotic osseous mets.  I spoke with patient by phone.  She reports that pain is slightly improved after liberalize dose of hydromorphone.  However, she says that she is out of hydromorphone early as she has at times had to take 3 tablets to obtain pain control and rest, particularly at night.  She still feels like pain is persistent and overall poorly controlled.  Patient does not feel like she has had much, if any improvement with pain after initiation of XRT.  XRT is targeting L-spine, SI joints, and femur.  Final XRT treatment is scheduled on 5/15.  Discussed further adjustment of her pain regimen including addition of gabapentin as patient is describing some radiculopathy.  Can also trial Lidoderm patch, although unclear how efficacious this will be in the context of her neoplasm related pain.  Also spoke with IR about possible RFA, although patient has fairly extensive spinal metastasis so unclear which level to target.  Assessment and Plan: Neoplasm related pain  -continue OxyContin.  Refill hydromorphone.  Add gabapentin and Lidoderm patch.  Possible referral to IR for consideration of RFA  Follow Up Instructions: Follow-up telephone visit 2 to 3 weeks   I discussed the assessment and treatment plan with the patient. The patient was provided an opportunity to ask questions and all were answered. The patient agreed with the plan and demonstrated an understanding of the instructions.   The patient was advised to call back or seek an in-person evaluation if the symptoms worsen or if the condition fails to improve as anticipated.  I provided 20 minutes of non-face-to-face time during this encounter.   Malachy Moan, NP

## 2023-01-17 NOTE — Telephone Encounter (Signed)
Pt l vm to disregard last message

## 2023-01-18 ENCOUNTER — Other Ambulatory Visit: Payer: Self-pay

## 2023-01-18 ENCOUNTER — Ambulatory Visit
Admission: RE | Admit: 2023-01-18 | Discharge: 2023-01-18 | Disposition: A | Discharge status: Home / Self Care | Payer: 59 | Source: Ambulatory Visit | Attending: Radiation Oncology | Admitting: Radiation Oncology

## 2023-01-18 ENCOUNTER — Telehealth: Payer: Self-pay | Admitting: *Deleted

## 2023-01-18 DIAGNOSIS — C770 Secondary and unspecified malignant neoplasm of lymph nodes of head, face and neck: Secondary | ICD-10-CM | POA: Diagnosis not present

## 2023-01-18 DIAGNOSIS — C3491 Malignant neoplasm of unspecified part of right bronchus or lung: Secondary | ICD-10-CM | POA: Diagnosis not present

## 2023-01-18 DIAGNOSIS — C7972 Secondary malignant neoplasm of left adrenal gland: Secondary | ICD-10-CM | POA: Diagnosis not present

## 2023-01-18 DIAGNOSIS — G893 Neoplasm related pain (acute) (chronic): Secondary | ICD-10-CM | POA: Diagnosis not present

## 2023-01-18 DIAGNOSIS — Z510 Encounter for antineoplastic radiation therapy: Secondary | ICD-10-CM | POA: Diagnosis not present

## 2023-01-18 DIAGNOSIS — C7951 Secondary malignant neoplasm of bone: Secondary | ICD-10-CM | POA: Diagnosis not present

## 2023-01-18 DIAGNOSIS — Z87891 Personal history of nicotine dependence: Secondary | ICD-10-CM | POA: Diagnosis not present

## 2023-01-18 DIAGNOSIS — C786 Secondary malignant neoplasm of retroperitoneum and peritoneum: Secondary | ICD-10-CM | POA: Diagnosis not present

## 2023-01-18 DIAGNOSIS — K529 Noninfective gastroenteritis and colitis, unspecified: Secondary | ICD-10-CM | POA: Diagnosis not present

## 2023-01-18 DIAGNOSIS — R630 Anorexia: Secondary | ICD-10-CM | POA: Diagnosis not present

## 2023-01-18 DIAGNOSIS — Z800 Family history of malignant neoplasm of digestive organs: Secondary | ICD-10-CM | POA: Diagnosis not present

## 2023-01-18 LAB — RAD ONC ARIA SESSION SUMMARY
Course Elapsed Days: 12
Plan Fractions Treated to Date: 9
Plan Prescribed Dose Per Fraction: 2 Gy
Plan Total Fractions Prescribed: 15
Plan Total Prescribed Dose: 30 Gy
Reference Point Dosage Given to Date: 18 Gy
Reference Point Session Dosage Given: 2 Gy
Session Number: 9

## 2023-01-18 NOTE — Telephone Encounter (Signed)
Gina Sanchez (Gina Sanchez) - [phone] Lidocaine 5% patches Status: [place] Response - ApprovedCreated: May 6th, 2024 [id number] Sent: May 7th, 2024

## 2023-01-19 ENCOUNTER — Ambulatory Visit
Admission: RE | Admit: 2023-01-19 | Discharge: 2023-01-19 | Disposition: A | Discharge status: Home / Self Care | Payer: 59 | Source: Ambulatory Visit | Attending: Radiation Oncology | Admitting: Radiation Oncology

## 2023-01-19 ENCOUNTER — Other Ambulatory Visit: Payer: Self-pay

## 2023-01-19 DIAGNOSIS — Z510 Encounter for antineoplastic radiation therapy: Secondary | ICD-10-CM | POA: Diagnosis not present

## 2023-01-19 DIAGNOSIS — C7972 Secondary malignant neoplasm of left adrenal gland: Secondary | ICD-10-CM | POA: Diagnosis not present

## 2023-01-19 DIAGNOSIS — Z87891 Personal history of nicotine dependence: Secondary | ICD-10-CM | POA: Diagnosis not present

## 2023-01-19 DIAGNOSIS — C770 Secondary and unspecified malignant neoplasm of lymph nodes of head, face and neck: Secondary | ICD-10-CM | POA: Diagnosis not present

## 2023-01-19 DIAGNOSIS — C3491 Malignant neoplasm of unspecified part of right bronchus or lung: Secondary | ICD-10-CM | POA: Diagnosis not present

## 2023-01-19 DIAGNOSIS — Z800 Family history of malignant neoplasm of digestive organs: Secondary | ICD-10-CM | POA: Diagnosis not present

## 2023-01-19 DIAGNOSIS — R630 Anorexia: Secondary | ICD-10-CM | POA: Diagnosis not present

## 2023-01-19 DIAGNOSIS — C7951 Secondary malignant neoplasm of bone: Secondary | ICD-10-CM | POA: Diagnosis not present

## 2023-01-19 DIAGNOSIS — C3480 Malignant neoplasm of overlapping sites of unspecified bronchus and lung: Secondary | ICD-10-CM | POA: Diagnosis not present

## 2023-01-19 DIAGNOSIS — G893 Neoplasm related pain (acute) (chronic): Secondary | ICD-10-CM | POA: Diagnosis not present

## 2023-01-19 DIAGNOSIS — C786 Secondary malignant neoplasm of retroperitoneum and peritoneum: Secondary | ICD-10-CM | POA: Diagnosis not present

## 2023-01-19 DIAGNOSIS — K529 Noninfective gastroenteritis and colitis, unspecified: Secondary | ICD-10-CM | POA: Diagnosis not present

## 2023-01-19 LAB — RAD ONC ARIA SESSION SUMMARY
Course Elapsed Days: 13
Plan Fractions Treated to Date: 10
Plan Prescribed Dose Per Fraction: 2 Gy
Plan Total Fractions Prescribed: 15
Plan Total Prescribed Dose: 30 Gy
Reference Point Dosage Given to Date: 20 Gy
Reference Point Session Dosage Given: 2 Gy
Session Number: 10

## 2023-01-20 ENCOUNTER — Other Ambulatory Visit: Payer: Self-pay

## 2023-01-20 ENCOUNTER — Ambulatory Visit
Admission: RE | Admit: 2023-01-20 | Discharge: 2023-01-20 | Disposition: A | Discharge status: Home / Self Care | Payer: 59 | Source: Ambulatory Visit | Attending: Radiation Oncology | Admitting: Radiation Oncology

## 2023-01-20 DIAGNOSIS — G893 Neoplasm related pain (acute) (chronic): Secondary | ICD-10-CM | POA: Diagnosis not present

## 2023-01-20 DIAGNOSIS — C770 Secondary and unspecified malignant neoplasm of lymph nodes of head, face and neck: Secondary | ICD-10-CM | POA: Diagnosis not present

## 2023-01-20 DIAGNOSIS — Z87891 Personal history of nicotine dependence: Secondary | ICD-10-CM | POA: Diagnosis not present

## 2023-01-20 DIAGNOSIS — K529 Noninfective gastroenteritis and colitis, unspecified: Secondary | ICD-10-CM | POA: Diagnosis not present

## 2023-01-20 DIAGNOSIS — C7972 Secondary malignant neoplasm of left adrenal gland: Secondary | ICD-10-CM | POA: Diagnosis not present

## 2023-01-20 DIAGNOSIS — C3491 Malignant neoplasm of unspecified part of right bronchus or lung: Secondary | ICD-10-CM | POA: Diagnosis not present

## 2023-01-20 DIAGNOSIS — C786 Secondary malignant neoplasm of retroperitoneum and peritoneum: Secondary | ICD-10-CM | POA: Diagnosis not present

## 2023-01-20 DIAGNOSIS — R630 Anorexia: Secondary | ICD-10-CM | POA: Diagnosis not present

## 2023-01-20 DIAGNOSIS — Z800 Family history of malignant neoplasm of digestive organs: Secondary | ICD-10-CM | POA: Diagnosis not present

## 2023-01-20 DIAGNOSIS — C7951 Secondary malignant neoplasm of bone: Secondary | ICD-10-CM | POA: Diagnosis not present

## 2023-01-20 DIAGNOSIS — Z510 Encounter for antineoplastic radiation therapy: Secondary | ICD-10-CM | POA: Diagnosis not present

## 2023-01-20 LAB — RAD ONC ARIA SESSION SUMMARY
Course Elapsed Days: 14
Plan Fractions Treated to Date: 11
Plan Prescribed Dose Per Fraction: 2 Gy
Plan Total Fractions Prescribed: 15
Plan Total Prescribed Dose: 30 Gy
Reference Point Dosage Given to Date: 22 Gy
Reference Point Session Dosage Given: 2 Gy
Session Number: 11

## 2023-01-21 ENCOUNTER — Other Ambulatory Visit: Payer: Self-pay

## 2023-01-21 ENCOUNTER — Ambulatory Visit
Admission: RE | Admit: 2023-01-21 | Discharge: 2023-01-21 | Disposition: A | Discharge status: Home / Self Care | Payer: 59 | Source: Ambulatory Visit | Attending: Radiation Oncology | Admitting: Radiation Oncology

## 2023-01-21 DIAGNOSIS — K523 Indeterminate colitis: Secondary | ICD-10-CM | POA: Diagnosis not present

## 2023-01-21 DIAGNOSIS — C7951 Secondary malignant neoplasm of bone: Secondary | ICD-10-CM | POA: Diagnosis not present

## 2023-01-21 DIAGNOSIS — R630 Anorexia: Secondary | ICD-10-CM | POA: Diagnosis not present

## 2023-01-21 DIAGNOSIS — Z510 Encounter for antineoplastic radiation therapy: Secondary | ICD-10-CM | POA: Diagnosis not present

## 2023-01-21 DIAGNOSIS — C786 Secondary malignant neoplasm of retroperitoneum and peritoneum: Secondary | ICD-10-CM | POA: Diagnosis not present

## 2023-01-21 DIAGNOSIS — C7972 Secondary malignant neoplasm of left adrenal gland: Secondary | ICD-10-CM | POA: Diagnosis not present

## 2023-01-21 DIAGNOSIS — C3491 Malignant neoplasm of unspecified part of right bronchus or lung: Secondary | ICD-10-CM | POA: Diagnosis not present

## 2023-01-21 DIAGNOSIS — Z87891 Personal history of nicotine dependence: Secondary | ICD-10-CM | POA: Diagnosis not present

## 2023-01-21 DIAGNOSIS — K529 Noninfective gastroenteritis and colitis, unspecified: Secondary | ICD-10-CM | POA: Diagnosis not present

## 2023-01-21 DIAGNOSIS — C770 Secondary and unspecified malignant neoplasm of lymph nodes of head, face and neck: Secondary | ICD-10-CM | POA: Diagnosis not present

## 2023-01-21 DIAGNOSIS — Z800 Family history of malignant neoplasm of digestive organs: Secondary | ICD-10-CM | POA: Diagnosis not present

## 2023-01-21 DIAGNOSIS — G893 Neoplasm related pain (acute) (chronic): Secondary | ICD-10-CM | POA: Diagnosis not present

## 2023-01-21 LAB — RAD ONC ARIA SESSION SUMMARY
Course Elapsed Days: 15
Plan Fractions Treated to Date: 12
Plan Prescribed Dose Per Fraction: 2 Gy
Plan Total Fractions Prescribed: 15
Plan Total Prescribed Dose: 30 Gy
Reference Point Dosage Given to Date: 24 Gy
Reference Point Session Dosage Given: 2 Gy
Session Number: 12

## 2023-01-24 ENCOUNTER — Ambulatory Visit
Admission: RE | Admit: 2023-01-24 | Discharge: 2023-01-24 | Disposition: A | Discharge status: Home / Self Care | Payer: 59 | Source: Ambulatory Visit | Attending: Radiation Oncology | Admitting: Radiation Oncology

## 2023-01-24 ENCOUNTER — Other Ambulatory Visit: Payer: Self-pay | Admitting: Oncology

## 2023-01-24 ENCOUNTER — Other Ambulatory Visit: Payer: Self-pay

## 2023-01-24 DIAGNOSIS — C770 Secondary and unspecified malignant neoplasm of lymph nodes of head, face and neck: Secondary | ICD-10-CM | POA: Diagnosis not present

## 2023-01-24 DIAGNOSIS — C7951 Secondary malignant neoplasm of bone: Secondary | ICD-10-CM | POA: Diagnosis not present

## 2023-01-24 DIAGNOSIS — Z800 Family history of malignant neoplasm of digestive organs: Secondary | ICD-10-CM | POA: Diagnosis not present

## 2023-01-24 DIAGNOSIS — Z510 Encounter for antineoplastic radiation therapy: Secondary | ICD-10-CM | POA: Diagnosis not present

## 2023-01-24 DIAGNOSIS — C786 Secondary malignant neoplasm of retroperitoneum and peritoneum: Secondary | ICD-10-CM | POA: Diagnosis not present

## 2023-01-24 DIAGNOSIS — C3491 Malignant neoplasm of unspecified part of right bronchus or lung: Secondary | ICD-10-CM | POA: Diagnosis not present

## 2023-01-24 DIAGNOSIS — Z87891 Personal history of nicotine dependence: Secondary | ICD-10-CM | POA: Diagnosis not present

## 2023-01-24 DIAGNOSIS — R630 Anorexia: Secondary | ICD-10-CM | POA: Diagnosis not present

## 2023-01-24 DIAGNOSIS — G893 Neoplasm related pain (acute) (chronic): Secondary | ICD-10-CM | POA: Diagnosis not present

## 2023-01-24 DIAGNOSIS — K529 Noninfective gastroenteritis and colitis, unspecified: Secondary | ICD-10-CM | POA: Diagnosis not present

## 2023-01-24 DIAGNOSIS — K523 Indeterminate colitis: Secondary | ICD-10-CM | POA: Diagnosis not present

## 2023-01-24 DIAGNOSIS — C7972 Secondary malignant neoplasm of left adrenal gland: Secondary | ICD-10-CM | POA: Diagnosis not present

## 2023-01-24 LAB — RAD ONC ARIA SESSION SUMMARY
Course Elapsed Days: 18
Plan Fractions Treated to Date: 13
Plan Prescribed Dose Per Fraction: 2 Gy
Plan Total Fractions Prescribed: 15
Plan Total Prescribed Dose: 30 Gy
Reference Point Dosage Given to Date: 26 Gy
Reference Point Session Dosage Given: 2 Gy
Session Number: 13

## 2023-01-25 ENCOUNTER — Encounter: Payer: Self-pay | Admitting: Oncology

## 2023-01-25 ENCOUNTER — Ambulatory Visit
Admission: RE | Admit: 2023-01-25 | Discharge: 2023-01-25 | Disposition: A | Discharge status: Home / Self Care | Payer: 59 | Source: Ambulatory Visit | Attending: Radiation Oncology | Admitting: Radiation Oncology

## 2023-01-25 ENCOUNTER — Other Ambulatory Visit: Payer: Self-pay

## 2023-01-25 DIAGNOSIS — Z800 Family history of malignant neoplasm of digestive organs: Secondary | ICD-10-CM | POA: Diagnosis not present

## 2023-01-25 DIAGNOSIS — C3491 Malignant neoplasm of unspecified part of right bronchus or lung: Secondary | ICD-10-CM | POA: Diagnosis not present

## 2023-01-25 DIAGNOSIS — C7951 Secondary malignant neoplasm of bone: Secondary | ICD-10-CM | POA: Diagnosis not present

## 2023-01-25 DIAGNOSIS — R630 Anorexia: Secondary | ICD-10-CM | POA: Diagnosis not present

## 2023-01-25 DIAGNOSIS — Z510 Encounter for antineoplastic radiation therapy: Secondary | ICD-10-CM | POA: Diagnosis not present

## 2023-01-25 DIAGNOSIS — K529 Noninfective gastroenteritis and colitis, unspecified: Secondary | ICD-10-CM | POA: Diagnosis not present

## 2023-01-25 DIAGNOSIS — C786 Secondary malignant neoplasm of retroperitoneum and peritoneum: Secondary | ICD-10-CM | POA: Diagnosis not present

## 2023-01-25 DIAGNOSIS — Z87891 Personal history of nicotine dependence: Secondary | ICD-10-CM | POA: Diagnosis not present

## 2023-01-25 DIAGNOSIS — G893 Neoplasm related pain (acute) (chronic): Secondary | ICD-10-CM | POA: Diagnosis not present

## 2023-01-25 DIAGNOSIS — C7972 Secondary malignant neoplasm of left adrenal gland: Secondary | ICD-10-CM | POA: Diagnosis not present

## 2023-01-25 DIAGNOSIS — C770 Secondary and unspecified malignant neoplasm of lymph nodes of head, face and neck: Secondary | ICD-10-CM | POA: Diagnosis not present

## 2023-01-25 LAB — RAD ONC ARIA SESSION SUMMARY
Course Elapsed Days: 19
Plan Fractions Treated to Date: 14
Plan Prescribed Dose Per Fraction: 2 Gy
Plan Total Fractions Prescribed: 15
Plan Total Prescribed Dose: 30 Gy
Reference Point Dosage Given to Date: 28 Gy
Reference Point Session Dosage Given: 2 Gy
Session Number: 14

## 2023-01-26 ENCOUNTER — Other Ambulatory Visit: Payer: Self-pay

## 2023-01-26 ENCOUNTER — Ambulatory Visit
Admission: RE | Admit: 2023-01-26 | Discharge: 2023-01-26 | Disposition: A | Discharge status: Home / Self Care | Payer: 59 | Source: Ambulatory Visit | Attending: Radiation Oncology | Admitting: Radiation Oncology

## 2023-01-26 DIAGNOSIS — C786 Secondary malignant neoplasm of retroperitoneum and peritoneum: Secondary | ICD-10-CM | POA: Diagnosis not present

## 2023-01-26 DIAGNOSIS — C7951 Secondary malignant neoplasm of bone: Secondary | ICD-10-CM | POA: Diagnosis not present

## 2023-01-26 DIAGNOSIS — C3491 Malignant neoplasm of unspecified part of right bronchus or lung: Secondary | ICD-10-CM | POA: Diagnosis not present

## 2023-01-26 DIAGNOSIS — C770 Secondary and unspecified malignant neoplasm of lymph nodes of head, face and neck: Secondary | ICD-10-CM | POA: Diagnosis not present

## 2023-01-26 DIAGNOSIS — Z510 Encounter for antineoplastic radiation therapy: Secondary | ICD-10-CM | POA: Diagnosis not present

## 2023-01-26 DIAGNOSIS — Z800 Family history of malignant neoplasm of digestive organs: Secondary | ICD-10-CM | POA: Diagnosis not present

## 2023-01-26 DIAGNOSIS — C7972 Secondary malignant neoplasm of left adrenal gland: Secondary | ICD-10-CM | POA: Diagnosis not present

## 2023-01-26 DIAGNOSIS — R630 Anorexia: Secondary | ICD-10-CM | POA: Diagnosis not present

## 2023-01-26 DIAGNOSIS — C3480 Malignant neoplasm of overlapping sites of unspecified bronchus and lung: Secondary | ICD-10-CM | POA: Diagnosis not present

## 2023-01-26 DIAGNOSIS — Z87891 Personal history of nicotine dependence: Secondary | ICD-10-CM | POA: Diagnosis not present

## 2023-01-26 DIAGNOSIS — K529 Noninfective gastroenteritis and colitis, unspecified: Secondary | ICD-10-CM | POA: Diagnosis not present

## 2023-01-26 DIAGNOSIS — G893 Neoplasm related pain (acute) (chronic): Secondary | ICD-10-CM | POA: Diagnosis not present

## 2023-01-26 LAB — RAD ONC ARIA SESSION SUMMARY
Course Elapsed Days: 20
Plan Fractions Treated to Date: 15
Plan Prescribed Dose Per Fraction: 2 Gy
Plan Total Fractions Prescribed: 15
Plan Total Prescribed Dose: 30 Gy
Reference Point Dosage Given to Date: 30 Gy
Reference Point Session Dosage Given: 2 Gy
Session Number: 15

## 2023-01-28 ENCOUNTER — Telehealth: Payer: Self-pay | Admitting: Oncology

## 2023-01-28 NOTE — Telephone Encounter (Signed)
Patient called to report that she has swelling under her left underarm and a marble size lump on her right shoulder.

## 2023-01-28 NOTE — Telephone Encounter (Signed)
I will see her on monday

## 2023-01-28 NOTE — Telephone Encounter (Addendum)
I spoke with patient who reports that she has had lump on her right shoulder and under her left arm for 2 weeks. They are  a little tender to touch, but no heat to area the lump under her left arm is a purplish color. She first thought the lump under her arm was an ingrown hair, but no linger thinks that. She has not tried warm compresses on the area as "they really haven't bothered me" Please advise

## 2023-01-31 ENCOUNTER — Other Ambulatory Visit: Payer: Self-pay | Admitting: *Deleted

## 2023-01-31 ENCOUNTER — Encounter: Payer: Self-pay | Admitting: Oncology

## 2023-01-31 ENCOUNTER — Other Ambulatory Visit: Payer: Self-pay

## 2023-01-31 ENCOUNTER — Other Ambulatory Visit: Payer: Self-pay | Admitting: Oncology

## 2023-01-31 ENCOUNTER — Telehealth: Payer: Self-pay

## 2023-01-31 ENCOUNTER — Inpatient Hospital Stay: Payer: 59 | Admitting: Oncology

## 2023-01-31 VITALS — BP 115/82 | HR 105 | Temp 95.3°F | Resp 18 | Ht 66.0 in | Wt 175.8 lb

## 2023-01-31 DIAGNOSIS — Z510 Encounter for antineoplastic radiation therapy: Secondary | ICD-10-CM | POA: Diagnosis not present

## 2023-01-31 DIAGNOSIS — K529 Noninfective gastroenteritis and colitis, unspecified: Secondary | ICD-10-CM | POA: Diagnosis not present

## 2023-01-31 DIAGNOSIS — Z800 Family history of malignant neoplasm of digestive organs: Secondary | ICD-10-CM | POA: Diagnosis not present

## 2023-01-31 DIAGNOSIS — C3491 Malignant neoplasm of unspecified part of right bronchus or lung: Secondary | ICD-10-CM | POA: Diagnosis not present

## 2023-01-31 DIAGNOSIS — G893 Neoplasm related pain (acute) (chronic): Secondary | ICD-10-CM | POA: Diagnosis not present

## 2023-01-31 DIAGNOSIS — C770 Secondary and unspecified malignant neoplasm of lymph nodes of head, face and neck: Secondary | ICD-10-CM | POA: Diagnosis not present

## 2023-01-31 DIAGNOSIS — C7951 Secondary malignant neoplasm of bone: Secondary | ICD-10-CM | POA: Diagnosis not present

## 2023-01-31 DIAGNOSIS — Z87891 Personal history of nicotine dependence: Secondary | ICD-10-CM | POA: Diagnosis not present

## 2023-01-31 DIAGNOSIS — C7972 Secondary malignant neoplasm of left adrenal gland: Secondary | ICD-10-CM | POA: Diagnosis not present

## 2023-01-31 DIAGNOSIS — C786 Secondary malignant neoplasm of retroperitoneum and peritoneum: Secondary | ICD-10-CM | POA: Diagnosis not present

## 2023-01-31 DIAGNOSIS — R630 Anorexia: Secondary | ICD-10-CM | POA: Diagnosis not present

## 2023-01-31 DIAGNOSIS — R2232 Localized swelling, mass and lump, left upper limb: Secondary | ICD-10-CM

## 2023-01-31 MED ORDER — HYDROMORPHONE HCL 2 MG PO TABS: 2.0000 mg | ORAL_TABLET | ORAL | 0 refills | Status: DC | PRN

## 2023-01-31 MED ORDER — FENTANYL 12 MCG/HR TD PT72: 1.0000 | MEDICATED_PATCH | TRANSDERMAL | 0 refills | Status: DC

## 2023-01-31 NOTE — Progress Notes (Signed)
Hematology/Oncology Consult note [facility]  Telephone:([phone] [phone]  Patient Care Team: Armando Gang, FNP as PCP - General (Family Medicine) Glory Buff, RN as Oncology Nurse Navigator Carmina Miller, MD as Radiation Oncologist (Radiation Oncology) Vida Rigger, MD as Consulting Physician (Pulmonary Disease)   Name of the patient: Gina Sanchez  [id number]  1965-11-16   Date of visit: 01/31/23  Diagnosis-metastatic adenocarcinoma of the lung  Chief complaint/ Reason for visit-acute concern for development of skin nodules  Heme/Onc history:  Patient is a 57 year old female with a past medical history significant for hypertension hyperlipidemia and anxiety who presented with right thigh pain and was found to have an acute right proximal femoral shaft fracture.  She underwent operative fixation on 10/12/2020.  MRI of femur showed heterogeneously enhancing osseous lesion within the area which would be nonspecific versus office neoplasm or metastatic lesion.  CT chest abdomen and pelvis with contrast showed an enlarged pretracheal lymph node 2.2 x 1.5 cm and a right paratracheal lymph node measuring 2.7 x 1.3 cm.  Prevascular node measuring 0.3 x 0.9 cm.  5 x 4 mm right middle lobe nodule.  2.8 x 1.9 cm left adrenal lesion.    Reamings from the right femur showed metastatic poorly differentiated carcinoma.  Immunohistochemistry showed was positive for pancytokeratin, CK7 and patchy CK20 with patchy dim expression of TTF-1.  Cells negative for Melan-A, CDX2, PAX8, Napsin A, GATA3, p40, CD56, p16 and thyroglobulin.  Findings compatible with metastatic carcinoma but because of decalcification immunohistochemical staining is unreliable.  Patchy dim staining with TTF-1 suspicious for lung primary but not a definitive diagnosis.   Repeat supraclavicular lymph node biopsy showed metastatic adenocarcinoma.  Tumor cells positive for CK7 with focal  weak staining for TTF-1.  Suggestive of lung origin in the proper clinical context.  Cells were negative for GATA3 PAX8 CDX2 and CK20 and Napsin A.  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  She is currently on hydrocortisone twice daily.  Thyroid functions presently are normal     Patient was treated with Ledell Noss Alimta Keytruda chemotherapy first-line followed by maintenance Alimta and Keytruda.  She developed autoimmune colitis secondary to [facility] and therefore that was stopped.  She is on Entyvio and follows up with GI for her autoimmune colitis.  Most recent regimen was maintenance Alimta. Disease progression in Jan 2024. Patients started on sotorasib.  Presently on full dose 960 mg daily    Interval history-patient reports ongoing fatigue.  Appetite is fair.  Food does not taste good.  She has noticed to soft tissue nodules over her right shoulder and left axilla.  She does not feel that the OxyContin helps her as much with pain control.  ECOG PS- 1 Pain scale- 4 Opioid associated constipation- no  Review of systems- Review of Systems  Constitutional:  Positive for malaise/fatigue. Negative for chills, fever and weight loss.       Loss of appetite  HENT:  Negative for congestion, ear discharge and nosebleeds.   Eyes:  Negative for blurred vision.  Respiratory:  Negative for cough, hemoptysis, sputum production, shortness of breath and wheezing.   Cardiovascular:  Negative for chest pain, palpitations, orthopnea and claudication.  Gastrointestinal:  Negative for abdominal pain, blood in stool, constipation, diarrhea, heartburn, melena, nausea and vomiting.  Genitourinary:  Negative for dysuria,  flank pain, frequency, hematuria and urgency.  Musculoskeletal:  Negative for back pain, joint pain and myalgias.  Skin:  Negative for rash.  Neurological:   Negative for dizziness, tingling, focal weakness, seizures, weakness and headaches.  Endo/Heme/Allergies:  Does not bruise/bleed easily.  Psychiatric/Behavioral:  Negative for depression and suicidal ideas. The patient does not have insomnia.       Allergies  Allergen Reactions   Factive [Gemifloxacin] Rash     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   Palpitations    Precordial chest pain    Wrist fracture 03/2021   left     Past Surgical History:  Procedure Laterality Date   BREAST BIOPSY Right 2017   benign   CERVICAL BIOPSY  W/ LOOP ELECTRODE EXCISION     COLONOSCOPY  06/11/2022   Procedure: COLONOSCOPY;  Surgeon: Toney Reil, MD;  Location: [facility] ENDOSCOPY;  Service: Gastroenterology;;   COLONOSCOPY WITH PROPOFOL N/A 07/03/2015   Procedure: COLONOSCOPY WITH PROPOFOL;  Surgeon: Wallace Cullens, MD;  Location: [facility] ENDOSCOPY;  Service: Gastroenterology;  Laterality: N/A;   COLONOSCOPY WITH PROPOFOL N/A 01/06/2022   Procedure: COLONOSCOPY WITH PROPOFOL;  Surgeon: Toney Reil, MD;  Location: [facility] ENDOSCOPY;  Service: Gastroenterology;  Laterality: N/A;   ESOPHAGOGASTRODUODENOSCOPY (EGD) WITH PROPOFOL N/A 07/03/2015   Procedure: ESOPHAGOGASTRODUODENOSCOPY (EGD) WITH PROPOFOL;  Surgeon: Wallace Cullens, MD;  Location: [facility] ENDOSCOPY;  Service: Gastroenterology;  Laterality: N/A;   FLEXIBLE SIGMOIDOSCOPY N/A 03/12/2022   Procedure: FLEXIBLE SIGMOIDOSCOPY;  Surgeon: Toney Reil, MD;  Location: [facility] ENDOSCOPY;  Service: Gastroenterology;  Laterality: N/A;   FLEXIBLE SIGMOIDOSCOPY N/A 08/17/2022   Procedure: FLEXIBLE SIGMOIDOSCOPY;  Surgeon: Toney Reil, MD;  Location: [facility] ENDOSCOPY;  Service: Gastroenterology;  Laterality: N/A;    FRACTURE SURGERY     INTRAMEDULLARY (IM) NAIL INTERTROCHANTERIC Right 09/15/2020   Procedure: INTRAMEDULLARY (IM) NAIL INTERTROCHANTRIC;  Surgeon: Christena Flake, MD;  Location: [facility] ORS;  Service: Orthopedics;  Laterality: Right;   IR CV LINE INJECTION  07/14/2022   IR IMAGING GUIDED PORT INSERTION  11/28/2020   LEFT HEART CATH AND CORONARY ANGIOGRAPHY N/A 03/28/2017   Procedure: Left Heart Cath and Coronary Angiography;  Surgeon: Runell Gess, MD;  Location: [facility] INVASIVE CV LAB;  Service: Cardiovascular;  Laterality: N/A;   ORIF WRIST FRACTURE Left 03/31/2021   Procedure: OPEN REDUCTION INTERNAL FIXATION (ORIF) LEFT DISTAL RADIUS FRACTURE.;  Surgeon: Christena Flake, MD;  Location: [facility] ORS;  Service: Orthopedics;  Laterality: Left;    Social History   Socioeconomic History   Marital status: Married    Spouse name: Gene   Number of children: 2   Years of education: Not on file   Highest education level: Not on file  Occupational History   Not on file  Tobacco Use   Smoking status: Former    Packs/day: 1.00    Years: 30.00    Additional pack years: 0.00    Total pack years: 30.00    Types: Cigarettes    Quit date: 09/17/2020    Years since quitting: 2.3   Smokeless tobacco: Never  Vaping Use   Vaping Use: Never used  Substance and Sexual Activity   Alcohol  use: Not Currently    Alcohol/week: 1.0 standard drink of alcohol    Types: 1 Standard drinks or equivalent per week    Comment: beer occ.   Drug use: No   Sexual activity: Yes    Birth control/protection: Post-menopausal  Other Topics Concern   Not on file  Social History Narrative   Lives in [place] with her husband.  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   Social Determinants of Health   Financial Resource Strain: Not on file  Food Insecurity: Not on file  Transportation Needs: No Transportation Needs (09/20/2022)   PRAPARE - Administrator, Civil Service  (Medical): No    Lack of Transportation (Non-Medical): No  Physical Activity: Not on file  Stress: Not on file  Social Connections: Not on file  Intimate Partner Violence: Not on file    Family History  Problem Relation Age of Onset   Diabetes Mother        alive @ 58   Goiter Mother    Heart disease Father        died of MI @ 27   Osteoporosis Maternal Grandmother    Colon cancer Maternal Grandfather    Breast cancer Neg Hx    Ovarian cancer Neg Hx      Current Outpatient Medications:    acetaminophen (TYLENOL) 500 MG tablet, Take 500 mg by mouth every 6 (six) hours as needed., Disp: , Rfl:    ALPRAZolam (XANAX) 0.5 MG tablet, TAKE ONE TABLET (0.5 MG) BY MOUTH EVERY DAY AS NEEDED, Disp: 30 tablet, Rfl: 0   citalopram (CELEXA) 10 MG tablet, Take 10 mg by mouth daily., Disp: , Rfl:    dicyclomine (BENTYL) 10 MG capsule, Take 1 capsule (10 mg total) by mouth every 8 (eight) hours as needed for spasms., Disp: 30 capsule, Rfl: 0   gabapentin (NEURONTIN) 100 MG capsule, Take 1 capsule (100 mg total) by mouth 3 (three) times daily., Disp: 60 capsule, Rfl: 0   hydrocortisone (CORTEF) 10 MG tablet, TAKE 1 TABLET BY MOUTH NIGHTLY, Disp: 30 tablet, Rfl: 3   hydrocortisone (CORTEF) 20 MG tablet, TAKE 1 TABLET BY MOUTH EVERY MORNING, Disp: 30 tablet, Rfl: 3   lidocaine (LIDODERM) 5 %, Place 1 patch onto the skin daily. Remove & Discard patch within 12 hours or as directed by MD, Disp: 30 patch, Rfl: 0   metoprolol tartrate (LOPRESSOR) 25 MG tablet, Take 25 mg by mouth in the morning., Disp: , Rfl:    nitrofurantoin, macrocrystal-monohydrate, (MACROBID) 100 MG capsule, Take 100 mg by mouth 2 (two) times daily., Disp: , Rfl:    ondansetron (ZOFRAN) 4 MG tablet, TAKE 1 TABLET BY MOUTH EVERY 8 HOURS AS NEEDED FOR NAUSEA AND VOMITING, Disp: 20 tablet, Rfl: 1   oxyCODONE (OXYCONTIN) 20 mg 12 hr tablet, Take 1 tablet (20 mg total) by mouth every 12 (twelve) hours., Disp: 30 tablet, Rfl: 0    prochlorperazine (COMPAZINE) 10 MG tablet, Take 1 tablet (10 mg total) by mouth every 6 (six) hours as needed for nausea or vomiting., Disp: 30 tablet, Rfl: 1   sotorasib (LUMAKRAS) 320 MG tablet, Take 3 tablets (960 mg total) by mouth daily., Disp: 90 tablet, Rfl: 1   valACYclovir (VALTREX) 1000 MG tablet, Take 1 tablet (1,000 mg total) by mouth 3 (three) times daily., Disp: 30 tablet, Rfl: 0   Vedolizumab (ENTYVIO IV), Inject 1 Dose into the vein as directed. Every  4 weeks at her home, Disp: , Rfl:    amoxicillin-clavulanate (AUGMENTIN) 875-125 MG tablet, Take 1 tablet by mouth 2 (two) times daily. (Patient not taking: Reported on 01/31/2023), Disp: 14 tablet, Rfl: 0   fentaNYL (DURAGESIC) 12 MCG/HR, Place 1 patch onto the skin every 3 (three) days., Disp: 5 patch, Rfl: 0   HYDROmorphone (DILAUDID) 2 MG tablet, Take 1-2 tablets (2-4 mg total) by mouth every 4 (four) hours as needed for severe pain., Disp: 120 tablet, Rfl: 0 No current facility-administered medications for this visit.  Facility-Administered Medications Ordered in Other Visits:    sodium chloride flush (NS) 0.9 % injection 10 mL, 10 mL, Intravenous, PRN, Creig Hines, MD, 10 mL at 03/09/21 0906  Physical exam:  Vitals:   01/31/23 0832  BP: 115/82  Pulse: (!) 105  Resp: 18  Temp: (!) 95.3 F (35.2 C)  TempSrc: Tympanic  SpO2: 98%  Weight: 175 lb 12.8 oz (79.7 kg)  Height: 5\' 6"  (1.676 m)   Physical Exam Cardiovascular:     Rate and Rhythm: Normal rate and regular rhythm.     Heart sounds: Normal heart sounds.  Pulmonary:     Effort: Pulmonary effort is normal.     Breath sounds: Normal breath sounds.  Abdominal:     General: Bowel sounds are normal.     Palpations: Abdomen is soft.  Skin:    General: Skin is warm and dry.     Comments: Palpable pea-sized subcutaneous nodule noted over the right supraclavicular region as well as left axilla  Neurological:     Mental Status: She is alert and oriented to person,  place, and time.         Latest Ref Rng & Units 12/17/2022    2:20 PM  CMP  Glucose 70 - 99 mg/dL 409   BUN 6 - 20 mg/dL 12   Creatinine 8.11 - 1.00 mg/dL 9.14   Sodium 782 - 956 mmol/L 136   Potassium 3.5 - 5.1 mmol/L 3.7   Chloride 98 - 111 mmol/L 104   CO2 22 - 32 mmol/L 23   Calcium 8.9 - 10.3 mg/dL 9.2   Total Protein 6.5 - 8.1 g/dL 8.4   Total Bilirubin 0.3 - 1.2 mg/dL 0.5   Alkaline Phos 38 - 126 U/L 297   AST 15 - 41 U/L 19   ALT 0 - 44 U/L 13       Latest Ref Rng & Units 12/17/2022    2:20 PM  CBC  WBC 4.0 - 10.5 K/uL 11.1   Hemoglobin 12.0 - 15.0 g/dL 21.3   Hematocrit 08.6 - 46.0 % 42.6   Platelets 150 - 400 K/uL 371      Assessment and plan- Patient is a 57 y.o. female with metastatic adenocarcinoma of the lung currently on second line sotorasib.  She is here to discuss new skin nodules  Patient has palpable pea-sized nodule over her right supraclavicular region as well as left axilla.  These appear to be soft tissue nodules and not lymph nodes.  This is concerning for progressive disease.  I will get a repeat biopsy of these nodules to rule out small cell transformation.  I am also moving up her CT and bone scan to this week.  If scans show progressive disease I plan to switch her to CarboTaxol Avastin regimen next week.  Neoplasm related pain: Continue as needed Dilaudid and I will rotate her from OxyContin to fentanyl patch 12 mcg every  3 days.  Keep future appointments with me as scheduled   Visit Diagnosis 1. Neoplasm related pain   2. Primary malignant neoplasm of right lung metastatic to other site [facility])      Dr. Owens Shark, MD, MPH [facility] [phone] 01/31/2023 12:39 PM

## 2023-01-31 NOTE — Telephone Encounter (Signed)
Patient states that her GI symptoms are controlled and not having any issues. She is on pain medications so some times gets constipated so will have to take something for constipation. She states Dr. Smith Robert is going to Biopsy the Lumps

## 2023-01-31 NOTE — Telephone Encounter (Signed)
-----   Message from Toney Reil, MD sent at 01/30/2023 11:46 PM EDT ----- Please check with patient if her GI symptoms have worsened recently because her CRP which is an inflammatory marker is quite elevated.  I see that she is seeing Dr. Smith Robert for some lumps.  Not sure if she has any infection which can cause CRP to be elevated  RV

## 2023-02-01 ENCOUNTER — Encounter: Payer: Self-pay | Admitting: Physician Assistant

## 2023-02-03 ENCOUNTER — Other Ambulatory Visit: Payer: Self-pay | Admitting: *Deleted

## 2023-02-03 ENCOUNTER — Ambulatory Visit
Admission: RE | Admit: 2023-02-03 | Discharge: 2023-02-03 | Disposition: A | Discharge status: Home / Self Care | Payer: 59 | Source: Ambulatory Visit | Attending: Oncology | Admitting: Oncology

## 2023-02-03 DIAGNOSIS — R222 Localized swelling, mass and lump, trunk: Secondary | ICD-10-CM

## 2023-02-03 DIAGNOSIS — C3491 Malignant neoplasm of unspecified part of right bronchus or lung: Secondary | ICD-10-CM | POA: Insufficient documentation

## 2023-02-03 DIAGNOSIS — C3411 Malignant neoplasm of upper lobe, right bronchus or lung: Secondary | ICD-10-CM | POA: Diagnosis not present

## 2023-02-03 DIAGNOSIS — M7989 Other specified soft tissue disorders: Secondary | ICD-10-CM

## 2023-02-03 DIAGNOSIS — R2232 Localized swelling, mass and lump, left upper limb: Secondary | ICD-10-CM

## 2023-02-03 LAB — POCT I-STAT CREATININE: Creatinine, Ser: 0.5 mg/dL (ref 0.44–1.00)

## 2023-02-03 MED ORDER — IOHEXOL 300 MG/ML  SOLN
100.0000 mL | Freq: Once | INTRAMUSCULAR | Status: AC | PRN
  Administered 2023-02-03: 100 mL via INTRAVENOUS

## 2023-02-04 ENCOUNTER — Other Ambulatory Visit: Payer: Self-pay | Admitting: Oncology

## 2023-02-04 ENCOUNTER — Telehealth: Payer: Self-pay | Admitting: *Deleted

## 2023-02-04 DIAGNOSIS — M7989 Other specified soft tissue disorders: Secondary | ICD-10-CM

## 2023-02-04 DIAGNOSIS — R2231 Localized swelling, mass and lump, right upper limb: Secondary | ICD-10-CM

## 2023-02-04 DIAGNOSIS — R222 Localized swelling, mass and lump, trunk: Secondary | ICD-10-CM

## 2023-02-04 NOTE — Telephone Encounter (Signed)
I called pt and let her know that dr Smith Robert wanted bx of the 2 places that felt like pea sized area over her right supraclavicular region as well as left axilla. These appear to be soft tissue nodules and not lymph nodes. After talking to IR, we need to get [place] for both places and it can be done at same time at 5/28 1:30 and arrive 15 min early. And  she will need to still come 5/28- the ct scan came in and dr Smith Robert wants to go over results. She is ok with both appts.

## 2023-02-08 ENCOUNTER — Inpatient Hospital Stay: Payer: 59 | Admitting: Oncology

## 2023-02-08 ENCOUNTER — Other Ambulatory Visit: Payer: Self-pay | Admitting: *Deleted

## 2023-02-08 ENCOUNTER — Ambulatory Visit
Admission: RE | Admit: 2023-02-08 | Discharge: 2023-02-08 | Disposition: A | Discharge status: Home / Self Care | Payer: 59 | Source: Ambulatory Visit | Attending: Oncology | Admitting: Oncology

## 2023-02-08 ENCOUNTER — Encounter: Payer: Self-pay | Admitting: Oncology

## 2023-02-08 VITALS — BP 112/88 | HR 101 | Temp 96.6°F | Resp 18 | Ht 66.0 in | Wt 174.7 lb

## 2023-02-08 DIAGNOSIS — G893 Neoplasm related pain (acute) (chronic): Secondary | ICD-10-CM

## 2023-02-08 DIAGNOSIS — Z7189 Other specified counseling: Secondary | ICD-10-CM

## 2023-02-08 DIAGNOSIS — C7972 Secondary malignant neoplasm of left adrenal gland: Secondary | ICD-10-CM | POA: Diagnosis not present

## 2023-02-08 DIAGNOSIS — R2232 Localized swelling, mass and lump, left upper limb: Secondary | ICD-10-CM | POA: Diagnosis not present

## 2023-02-08 DIAGNOSIS — C3491 Malignant neoplasm of unspecified part of right bronchus or lung: Secondary | ICD-10-CM

## 2023-02-08 DIAGNOSIS — R222 Localized swelling, mass and lump, trunk: Secondary | ICD-10-CM | POA: Diagnosis not present

## 2023-02-08 DIAGNOSIS — M7989 Other specified soft tissue disorders: Secondary | ICD-10-CM | POA: Insufficient documentation

## 2023-02-08 DIAGNOSIS — R630 Anorexia: Secondary | ICD-10-CM | POA: Diagnosis not present

## 2023-02-08 DIAGNOSIS — R2231 Localized swelling, mass and lump, right upper limb: Secondary | ICD-10-CM | POA: Diagnosis not present

## 2023-02-08 DIAGNOSIS — Z510 Encounter for antineoplastic radiation therapy: Secondary | ICD-10-CM | POA: Diagnosis not present

## 2023-02-08 DIAGNOSIS — K529 Noninfective gastroenteritis and colitis, unspecified: Secondary | ICD-10-CM | POA: Diagnosis not present

## 2023-02-08 DIAGNOSIS — C770 Secondary and unspecified malignant neoplasm of lymph nodes of head, face and neck: Secondary | ICD-10-CM | POA: Diagnosis not present

## 2023-02-08 DIAGNOSIS — C786 Secondary malignant neoplasm of retroperitoneum and peritoneum: Secondary | ICD-10-CM | POA: Diagnosis not present

## 2023-02-08 DIAGNOSIS — C7951 Secondary malignant neoplasm of bone: Secondary | ICD-10-CM | POA: Diagnosis not present

## 2023-02-08 DIAGNOSIS — Z87891 Personal history of nicotine dependence: Secondary | ICD-10-CM | POA: Diagnosis not present

## 2023-02-08 DIAGNOSIS — Z800 Family history of malignant neoplasm of digestive organs: Secondary | ICD-10-CM | POA: Diagnosis not present

## 2023-02-08 MED ORDER — FENTANYL 25 MCG/HR TD PT72: 1.0000 | MEDICATED_PATCH | TRANSDERMAL | 0 refills | Status: DC

## 2023-02-08 NOTE — Progress Notes (Signed)
Hematology/Oncology Consult note [facility]  Telephone:([phone] [phone]  Patient Care Team: Armando Gang, FNP as PCP - General (Family Medicine) Glory Buff, RN as Oncology Nurse Navigator Carmina Miller, MD as Radiation Oncologist (Radiation Oncology) Vida Rigger, MD as Consulting Physician (Pulmonary Disease)   Name of the patient: Gina Sanchez  [id number]  1965-11-30   Date of visit: 02/08/23  Diagnosis-metastatic adenocarcinoma of the lung  Chief complaint/ Reason for visit-discussed with the management of lung cancer  Heme/Onc history:   Patient is a 57 year old female with a past medical history significant for hypertension hyperlipidemia and anxiety who presented with right thigh pain and was found to have an acute right proximal femoral shaft fracture.  She underwent operative fixation on 10/12/2020.  MRI of femur showed heterogeneously enhancing osseous lesion within the area which would be nonspecific versus office neoplasm or metastatic lesion.  CT chest abdomen and pelvis with contrast showed an enlarged pretracheal lymph node 2.2 x 1.5 cm and a right paratracheal lymph node measuring 2.7 x 1.3 cm.  Prevascular node measuring 0.3 x 0.9 cm.  5 x 4 mm right middle lobe nodule.  2.8 x 1.9 cm left adrenal lesion.    Reamings from the right femur showed metastatic poorly differentiated carcinoma.  Immunohistochemistry showed was positive for pancytokeratin, CK7 and patchy CK20 with patchy dim expression of TTF-1.  Cells negative for Melan-A, CDX2, PAX8, Napsin A, GATA3, p40, CD56, p16 and thyroglobulin.  Findings compatible with metastatic carcinoma but because of decalcification immunohistochemical staining is unreliable.  Patchy dim staining with TTF-1 suspicious for lung primary but not a definitive diagnosis.   Repeat supraclavicular lymph node biopsy showed metastatic adenocarcinoma.  Tumor cells positive for CK7 with focal  weak staining for TTF-1.  Suggestive of lung origin in the proper clinical context.  Cells were negative for GATA3 PAX8 CDX2 and CK20 and Napsin A.  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  She is currently on hydrocortisone twice daily.  Thyroid functions presently are normal     Patient was treated with [place] Alimta Keytruda chemotherapy first-line followed by maintenance Alimta and Keytruda.  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  She is on Entyvio and follows up with GI for her autoimmune colitis.  Most recent regimen was maintenance Alimta. Disease progression in Jan 2024. Patients started on sotorasib.  Presently on full dose 960 mg daily.  Disease progression in May 2024.  Plan to switch to CarboTaxol Avastin  Interval history-patient states that fentanyl patch has been working better for her but she is still using Dilaudid round-the-clock every 4 hours.  Appetite is fair to poor.  She has lost 15 pounds in the last 6 weeks.  ECOG PS- 1 Pain scale- 3 Opioid associated constipation- no  Review of systems- Review of Systems  Constitutional:  Positive for malaise/fatigue and weight loss. Negative for chills and fever.  HENT:  Negative for congestion, ear discharge and nosebleeds.   Eyes:  Negative for blurred vision.  Respiratory:  Negative for cough, hemoptysis, sputum production, shortness of breath and wheezing.   Cardiovascular:  Negative for chest pain, palpitations, orthopnea and claudication.  Gastrointestinal:  Positive for abdominal pain. Negative for blood in stool, constipation, diarrhea, heartburn, melena, nausea and vomiting.  Genitourinary:  Negative for dysuria,  flank pain, frequency, hematuria and urgency.  Musculoskeletal:  Negative for back pain, joint pain and myalgias.  Skin:  Negative  for rash.  Neurological:  Negative for dizziness, tingling, focal weakness, seizures, weakness and headaches.  Endo/Heme/Allergies:  Does not bruise/bleed easily.  Psychiatric/Behavioral:  Negative for depression and suicidal ideas. The patient does not have insomnia.       Allergies  Allergen Reactions   Factive [Gemifloxacin] Rash     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   Palpitations    Precordial chest pain    Wrist fracture 03/2021   left     Past Surgical History:  Procedure Laterality Date   BREAST BIOPSY Right 2017   benign   CERVICAL BIOPSY  W/ LOOP ELECTRODE EXCISION     COLONOSCOPY  06/11/2022   Procedure: COLONOSCOPY;  Surgeon: Toney Reil, MD;  Location: [facility] ENDOSCOPY;  Service: Gastroenterology;;   COLONOSCOPY WITH PROPOFOL N/A 07/03/2015   Procedure: COLONOSCOPY WITH PROPOFOL;  Surgeon: Wallace Cullens, MD;  Location: [facility] ENDOSCOPY;  Service: Gastroenterology;  Laterality: N/A;   COLONOSCOPY WITH PROPOFOL N/A 01/06/2022   Procedure: COLONOSCOPY WITH PROPOFOL;  Surgeon: Toney Reil, MD;  Location: [facility] ENDOSCOPY;  Service: Gastroenterology;  Laterality: N/A;   ESOPHAGOGASTRODUODENOSCOPY (EGD) WITH PROPOFOL N/A 07/03/2015   Procedure: ESOPHAGOGASTRODUODENOSCOPY (EGD) WITH PROPOFOL;  Surgeon: Wallace Cullens, MD;  Location: [facility] ENDOSCOPY;  Service: Gastroenterology;  Laterality: N/A;   FLEXIBLE SIGMOIDOSCOPY N/A 03/12/2022   Procedure: FLEXIBLE SIGMOIDOSCOPY;  Surgeon: Toney Reil, MD;  Location: [facility] ENDOSCOPY;  Service: Gastroenterology;  Laterality: N/A;   FLEXIBLE SIGMOIDOSCOPY N/A 08/17/2022   Procedure: FLEXIBLE SIGMOIDOSCOPY;  Surgeon: Toney Reil, MD;  Location: [facility] ENDOSCOPY;  Service:  Gastroenterology;  Laterality: N/A;   FRACTURE SURGERY     INTRAMEDULLARY (IM) NAIL INTERTROCHANTERIC Right 09/15/2020   Procedure: INTRAMEDULLARY (IM) NAIL INTERTROCHANTRIC;  Surgeon: Christena Flake, MD;  Location: [facility] ORS;  Service: Orthopedics;  Laterality: Right;   IR CV LINE INJECTION  07/14/2022   IR IMAGING GUIDED PORT INSERTION  11/28/2020   LEFT HEART CATH AND CORONARY ANGIOGRAPHY N/A 03/28/2017   Procedure: Left Heart Cath and Coronary Angiography;  Surgeon: Runell Gess, MD;  Location: [facility] INVASIVE CV LAB;  Service: Cardiovascular;  Laterality: N/A;   ORIF WRIST FRACTURE Left 03/31/2021   Procedure: OPEN REDUCTION INTERNAL FIXATION (ORIF) LEFT DISTAL RADIUS FRACTURE.;  Surgeon: Christena Flake, MD;  Location: [facility] ORS;  Service: Orthopedics;  Laterality: Left;    Social History   Socioeconomic History   Marital status: Married    Spouse name: Gene   Number of children: 2   Years of education: Not on file   Highest education level: Not on file  Occupational History   Not on file  Tobacco Use   Smoking status: Former    Packs/day: 1.00    Years: 30.00    Additional pack years: 0.00    Total pack years: 30.00    Types: Cigarettes    Quit date: 09/17/2020    Years since quitting: 2.3   Smokeless tobacco: Never  Vaping Use   Vaping Use: Never used  Substance and Sexual Activity   Alcohol  use: Not Currently    Alcohol/week: 1.0 standard drink of alcohol    Types: 1 Standard drinks or equivalent per week    Comment: beer occ.   Drug use: No   Sexual activity: Yes    Birth control/protection: Post-menopausal  Other Topics Concern   Not on file  Social History Narrative   Lives in [place] with her husband.  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   Social Determinants of Health   Financial Resource Strain: Not on file  Food Insecurity: Not on file  Transportation Needs: No Transportation Needs (09/20/2022)   PRAPARE -  Administrator, Civil Service (Medical): No    Lack of Transportation (Non-Medical): No  Physical Activity: Not on file  Stress: Not on file  Social Connections: Not on file  Intimate Partner Violence: Not on file    Family History  Problem Relation Age of Onset   Diabetes Mother        alive @ 28   Goiter Mother    Heart disease Father        died of MI @ 65   Osteoporosis Maternal Grandmother    Colon cancer Maternal Grandfather    Breast cancer Neg Hx    Ovarian cancer Neg Hx      Current Outpatient Medications:    acetaminophen (TYLENOL) 500 MG tablet, Take 500 mg by mouth every 6 (six) hours as needed., Disp: , Rfl:    ALPRAZolam (XANAX) 0.5 MG tablet, TAKE ONE TABLET (0.5 MG) BY MOUTH EVERY DAY AS NEEDED, Disp: 30 tablet, Rfl: 0   citalopram (CELEXA) 10 MG tablet, Take 10 mg by mouth daily., Disp: , Rfl:    dicyclomine (BENTYL) 10 MG capsule, Take 1 capsule (10 mg total) by mouth every 8 (eight) hours as needed for spasms., Disp: 30 capsule, Rfl: 0   gabapentin (NEURONTIN) 100 MG capsule, Take 1 capsule (100 mg total) by mouth 3 (three) times daily., Disp: 60 capsule, Rfl: 0   hydrocortisone (CORTEF) 10 MG tablet, TAKE 1 TABLET BY MOUTH NIGHTLY, Disp: 30 tablet, Rfl: 3   hydrocortisone (CORTEF) 20 MG tablet, TAKE 1 TABLET BY MOUTH EVERY MORNING, Disp: 30 tablet, Rfl: 3   HYDROmorphone (DILAUDID) 2 MG tablet, Take 1-2 tablets (2-4 mg total) by mouth every 4 (four) hours as needed for severe pain., Disp: 120 tablet, Rfl: 0   lidocaine (LIDODERM) 5 %, Place 1 patch onto the skin daily. Remove & Discard patch within 12 hours or as directed by MD, Disp: 30 patch, Rfl: 0   metoprolol tartrate (LOPRESSOR) 25 MG tablet, Take 25 mg by mouth in the morning., Disp: , Rfl:    nitrofurantoin, macrocrystal-monohydrate, (MACROBID) 100 MG capsule, Take 100 mg by mouth 2 (two) times daily., Disp: , Rfl:    ondansetron (ZOFRAN) 4 MG tablet, TAKE 1 TABLET BY MOUTH EVERY 8 HOURS AS  NEEDED FOR NAUSEA AND VOMITING, Disp: 20 tablet, Rfl: 1   prochlorperazine (COMPAZINE) 10 MG tablet, Take 1 tablet (10 mg total) by mouth every 6 (six) hours as needed for nausea or vomiting., Disp: 30 tablet, Rfl: 1   sotorasib (LUMAKRAS) 320 MG tablet, Take 3 tablets (960 mg total) by mouth daily., Disp: 90 tablet, Rfl: 1   valACYclovir (VALTREX) 1000 MG tablet, Take 1 tablet (1,000 mg total) by mouth 3 (three) times daily., Disp: 30 tablet, Rfl: 0   Vedolizumab (ENTYVIO IV), Inject 1 Dose into the vein  as directed. Every 4 weeks at her home, Disp: , Rfl:    amoxicillin-clavulanate (AUGMENTIN) 875-125 MG tablet, Take 1 tablet by mouth 2 (two) times daily. (Patient not taking: Reported on 01/31/2023), Disp: 14 tablet, Rfl: 0   fentaNYL (DURAGESIC) 25 MCG/HR, Place 1 patch onto the skin every 3 (three) days., Disp: 10 patch, Rfl: 0 No current facility-administered medications for this visit.  Facility-Administered Medications Ordered in Other Visits:    sodium chloride flush (NS) 0.9 % injection 10 mL, 10 mL, Intravenous, PRN, Creig Hines, MD, 10 mL at 03/09/21 0906  Physical exam:  Vitals:   02/08/23 0850  BP: 112/88  Pulse: (!) 101  Resp: 18  Temp: (!) 96.6 F (35.9 C)  TempSrc: Tympanic  SpO2: 98%  Weight: 174 lb 11.2 oz (79.2 kg)  Height: 5\' 6"  (1.676 m)   Physical Exam Cardiovascular:     Rate and Rhythm: Normal rate and regular rhythm.     Heart sounds: Normal heart sounds.  Pulmonary:     Effort: Pulmonary effort is normal.     Breath sounds: Normal breath sounds.  Abdominal:     General: Bowel sounds are normal.     Palpations: Abdomen is soft.  Skin:    General: Skin is warm and dry.  Neurological:     Mental Status: She is alert and oriented to person, place, and time.         Latest Ref Rng & Units 02/03/2023   11:33 AM  CMP  Creatinine 0.44 - 1.00 mg/dL 1.61       Latest Ref Rng & Units 12/17/2022    2:20 PM  CBC  WBC 4.0 - 10.5 K/uL 11.1   Hemoglobin  12.0 - 15.0 g/dL 09.6   Hematocrit 04.5 - 46.0 % 42.6   Platelets 150 - 400 K/uL 371       CT CHEST ABDOMEN PELVIS W CONTRAST  Result Date: 02/04/2023 CLINICAL DATA:  Right lung cancer, adenocarcinoma. Development of skin nodules. Nodules of right shoulder and left axilla. * Tracking Code: BO * EXAM: CT CHEST, ABDOMEN, AND PELVIS WITH CONTRAST TECHNIQUE: Multidetector CT imaging of the chest, abdomen and pelvis was performed following the standard protocol during bolus administration of intravenous contrast. RADIATION DOSE REDUCTION: This exam was performed according to the departmental dose-optimization program which includes automated exposure control, adjustment of the mA and/or kV according to patient size and/or use of iterative reconstruction technique. CONTRAST:  OMNIPAQUE IOHEXOL 300 MG/ML  SOLN COMPARISON:  12/15/2022 FINDINGS: CT CHEST FINDINGS Cardiovascular: Right Port-A-Cath tip high right atrium. Aortic atherosclerosis. Tortuous thoracic aorta. Normal heart size, without pericardial effusion. Multivessel coronary artery atherosclerosis. No central pulmonary embolism, on this non-dedicated study. Mediastinum/Nodes: Right supraclavicular node measures 7 mm on 06/02 and is enlarged from 4-5 mm on the prior. Posterior left axillary node measures 1.5 cm on 10/02 versus 8 mm on the prior (when remeasured). Right paratracheal ill-defined soft tissue density including on 17/2 is similar, difficult to quantify secondary to ill definition of soft tissue planes in this region. Left hilar node measures 1.2 cm in 25/2 and is newly enlarged. Tiny hiatal hernia. Nodularity in the epicardial fat including at up to 7 mm on 48/2, increased from 1-2 mm on the prior. Lungs/Pleura: No pleural fluid. Increase in right perihilar and paramediastinal consolidation and volume loss. The right lower lobe area of dense consolidation medially has improved and the right lower lobe airspace and ground-glass opacities  have resolved. A 2  mm left upper lobe pulmonary nodule on 46/4 is unchanged. The posterior left upper lobe pulmonary nodule detailed on the prior exam measures 4 mm on 55/4 today versus 5 mm on the prior. Musculoskeletal: Anterior right chest wall subcutaneous nodule of 8 mm on 19/2 measured 4 mm on the prior. Diffuse sclerotic osseous metastasis are similar. CT ABDOMEN PELVIS FINDINGS Hepatobiliary: Focal steatosis adjacent the falciform ligament. Normal gallbladder, without biliary ductal dilatation. Pancreas: Mild hypoattenuation in the pancreatic head is favored to be related to focal fat deposition. No duct dilatation or acute inflammation. Spleen: Normal in size, without focal abnormality. Adrenals/Urinary Tract: Right adrenal thickening is unchanged. Left adrenal calcification and nodularity including up to 1.2 cm on 62/2, decreased from 1.5 cm on the prior. Too small to characterize bilateral renal lesions . In the absence of clinically indicated signs/symptoms require(s) no independent follow-up. No hydronephrosis. Normal urinary bladder. Stomach/Bowel: Normal remainder of the stomach. Normal colon, appendix, and terminal ileum. Normal small bowel. Vascular/Lymphatic: Aortic atherosclerosis. No abdominal retroperitoneal or pelvic sidewall adenopathy. Reproductive: Normal uterus and adnexa. Other: No significant free fluid. Diffuse peritoneal and retroperitoneal nodularity is progressive. Example omental nodule of 11 mm on 59/2 measured 5 mm on the prior (when remeasured). Perinephric nodularity with a right-sided nodule measuring 8 mm on 77/2 versus 6 mm on the prior exam (when remeasured). A perirectal 9 mm nodule on 118/2 measured 5 mm on the prior exam (when remeasured). Musculoskeletal: There are no intramuscular metastasis, with an index 1.3 cm right paraspinal lesion on 85/2. Right proximal femur fixation. Similar diffuse sclerotic osseous metastasis. Index left iliac lesion of 5.2 cm on 102/2.  IMPRESSION: CT CHEST IMPRESSION 1. Nodal progression within the low neck and chest, as detailed above. Most significant nodal enlargement is within the left axilla, right supraclavicular/low cervical and left hilar stations. 2. Evolution of presumed radiation change in the right paramediastinal and perihilar lung. Resolution of right lower lobe dense consolidation and surrounding tree-in-bud nodularity. 3. Similar tiny pulmonary nodules. 4. Similar diffuse osseous metastasis. 5. Developing subcutaneous metastasis. 6. Coronary artery atherosclerosis. Aortic Atherosclerosis (ICD10-I70.0). CT ABDOMEN AND PELVIS IMPRESSION 1. Moderate progression of peritoneal and retroperitoneal metastasis. Developing intramuscular metastasis. 2. No bowel obstruction or other acute complication. 3. Mild decrease in left adrenal metastasis. Electronically Signed   By: Jeronimo Greaves M.D.   On: 02/04/2023 08:31     Assessment and plan- Patient is a 57 y.o. female with metastatic adenocarcinoma of the lung here to discuss further management  Patient was diagnosed with stage IV adenocarcinoma of the lung in January 2022.  She was initially treated with 4 cycles of carbo Alimta Keytruda chemotherapy followed by maintenance Alimta and Keytruda.  Treatment was complicated by autoimmune colitis requiring stoppage of Keytruda.  She then had disease progression on Alimta and was switched to second line sotorasib given that she had K-ras G12 C mutation.  I reviewed her recent CT chest abdomen pelvis images independently and discussed findings with the patient which shows evidence of progressive disease as evidenced by worsening supraclavicular or axillary hilar as well as retroperitoneal adenopathy and peritoneal carcinomatosis.  Bone scan results are currently awaited.  I am also planning to get a repeat biopsy of her subcutaneous nodule to rule out any small cell transformation.  I have asked her to stop sotorasib at this time and I will  plan to switch her to second line carboplatin Taxol and bevacizumab chemotherapy.  She initially had a good response to [place] Alimta  regimen for nearly 2 years before she progressed.  Discussed risks and benefits of chemotherapy including all but not limited to nausea, vomiting, low blood counts, risk of infections and hospitalization.  Risk of peripheral neuropathy and infusion reaction associated with Taxol.  Potential risks of Avastinincluding all but not limited to hypertension, leg swelling, proteinuria and risk of bleeding and thrombosis.  Treatment will be given with a palliative intent.  Will tentatively plan to start treatment next week.  Neoplasm related pain: I have increased her fentanyl patch dosing today to 25 mcg.  Continue as needed Dilaudid  Lack of appetite: We will see if we can get Marinol approved.  Discussed risks and benefits of Marinol including all but not limited to GI side effects and possible confusion.  Patient understands and agrees to proceed as planned   Visit Diagnosis 1. Primary malignant neoplasm of right lung metastatic to other site [facility])   2. Goals of care, counseling/discussion   3. Neoplasm related pain      Dr. Owens Shark, MD, MPH [facility] [phone] 02/08/2023 12:12 PM

## 2023-02-08 NOTE — Progress Notes (Signed)
DISCONTINUE OFF PATHWAY REGIMEN - Non-Small Cell Lung   [id number]:Carboplatin AUC=5 + Pemetrexed 500 mg/m2 q21 Days:   A cycle is every 21 days:     Pemetrexed      Carboplatin   **Always confirm dose/schedule in your pharmacy ordering system**  REASON: Disease Progression PRIOR TREATMENT: Off Pathway: Carboplatin AUC=5 + Pemetrexed 500 mg/m2 q21 Days TREATMENT RESPONSE: Progressive Disease (PD)  START OFF PATHWAY REGIMEN - Non-Small Cell Lung   [id number]:Carboplatin + Paclitaxel (200) + Bevacizumab q21 Days:   A cycle is every 21 days:     Paclitaxel      Carboplatin      Bevacizumab-xxxx   **Always confirm dose/schedule in your pharmacy ordering system**  Patient Characteristics: Stage IV Metastatic, Nonsquamous, Molecular Analysis Completed, Molecular Alteration Present and Targeted Therapy Exhausted OR KRAS G12C+ or HER2+ Present and No Prior Chemo/Immunotherapy OR No Alteration Present, Second Line -  Chemotherapy/Immunotherapy, PS = 0, 1, No Prior PD-1/PD-L1  Inhibitor or Prior PD-1/PD-L1 Inhibitor + Chemotherapy, and Not a Candidate for Immunotherapy Therapeutic Status: Stage IV Metastatic Histology: Nonsquamous Cell Broad Molecular Profiling Status: Molecular Analysis Completed Molecular Analysis Results: Alteration Present and Targeted Therapy Exhausted ECOG Performance Status: 1 Chemotherapy/Immunotherapy Line of Therapy: Second Line Chemotherapy/Immunotherapy Immunotherapy Candidate Status: Not a Candidate for Immunotherapy Prior Immunotherapy Status: Prior PD-1/PD-L1 Inhibitor + Chemotherapy Intent of Therapy: Non-Curative / Palliative Intent, Discussed with Patient

## 2023-02-09 ENCOUNTER — Ambulatory Visit: Payer: 59

## 2023-02-09 ENCOUNTER — Encounter
Admission: RE | Admit: 2023-02-09 | Discharge: 2023-02-09 | Disposition: A | Discharge status: Home / Self Care | Payer: 59 | Source: Ambulatory Visit | Attending: Oncology | Admitting: Oncology

## 2023-02-09 ENCOUNTER — Other Ambulatory Visit: Payer: 59

## 2023-02-09 DIAGNOSIS — C3491 Malignant neoplasm of unspecified part of right bronchus or lung: Secondary | ICD-10-CM | POA: Insufficient documentation

## 2023-02-09 DIAGNOSIS — C3490 Malignant neoplasm of unspecified part of unspecified bronchus or lung: Secondary | ICD-10-CM | POA: Diagnosis not present

## 2023-02-09 MED ORDER — TECHNETIUM TC 99M MEDRONATE IV KIT
20.0000 | PACK | Freq: Once | INTRAVENOUS | Status: AC | PRN
  Administered 2023-02-09: 20.21 via INTRAVENOUS

## 2023-02-10 ENCOUNTER — Other Ambulatory Visit: Payer: Self-pay

## 2023-02-10 ENCOUNTER — Observation Stay: Payer: 59

## 2023-02-10 ENCOUNTER — Inpatient Hospital Stay: Payer: 59 | Admitting: Hospice and Palliative Medicine

## 2023-02-10 ENCOUNTER — Observation Stay
Admission: EM | Admit: 2023-02-10 | Discharge: 2023-02-11 | Disposition: A | Discharge status: Home / Self Care | Payer: 59 | Attending: Obstetrics and Gynecology | Admitting: Obstetrics and Gynecology

## 2023-02-10 ENCOUNTER — Emergency Department: Payer: 59

## 2023-02-10 DIAGNOSIS — Z79899 Other long term (current) drug therapy: Secondary | ICD-10-CM | POA: Diagnosis not present

## 2023-02-10 DIAGNOSIS — C770 Secondary and unspecified malignant neoplasm of lymph nodes of head, face and neck: Secondary | ICD-10-CM | POA: Diagnosis not present

## 2023-02-10 DIAGNOSIS — Z87891 Personal history of nicotine dependence: Secondary | ICD-10-CM | POA: Insufficient documentation

## 2023-02-10 DIAGNOSIS — C7970 Secondary malignant neoplasm of unspecified adrenal gland: Secondary | ICD-10-CM | POA: Insufficient documentation

## 2023-02-10 DIAGNOSIS — C3491 Malignant neoplasm of unspecified part of right bronchus or lung: Secondary | ICD-10-CM | POA: Diagnosis not present

## 2023-02-10 DIAGNOSIS — Z800 Family history of malignant neoplasm of digestive organs: Secondary | ICD-10-CM | POA: Diagnosis not present

## 2023-02-10 DIAGNOSIS — C7972 Secondary malignant neoplasm of left adrenal gland: Secondary | ICD-10-CM | POA: Diagnosis not present

## 2023-02-10 DIAGNOSIS — R630 Anorexia: Secondary | ICD-10-CM | POA: Diagnosis not present

## 2023-02-10 DIAGNOSIS — C7951 Secondary malignant neoplasm of bone: Secondary | ICD-10-CM | POA: Insufficient documentation

## 2023-02-10 DIAGNOSIS — C7800 Secondary malignant neoplasm of unspecified lung: Secondary | ICD-10-CM | POA: Insufficient documentation

## 2023-02-10 DIAGNOSIS — J9811 Atelectasis: Secondary | ICD-10-CM | POA: Diagnosis not present

## 2023-02-10 DIAGNOSIS — M5136 Other intervertebral disc degeneration, lumbar region: Secondary | ICD-10-CM | POA: Diagnosis not present

## 2023-02-10 DIAGNOSIS — M546 Pain in thoracic spine: Secondary | ICD-10-CM | POA: Diagnosis not present

## 2023-02-10 DIAGNOSIS — M549 Dorsalgia, unspecified: Secondary | ICD-10-CM

## 2023-02-10 DIAGNOSIS — I10 Essential (primary) hypertension: Secondary | ICD-10-CM | POA: Diagnosis not present

## 2023-02-10 DIAGNOSIS — M545 Low back pain, unspecified: Secondary | ICD-10-CM | POA: Diagnosis not present

## 2023-02-10 DIAGNOSIS — K529 Noninfective gastroenteritis and colitis, unspecified: Secondary | ICD-10-CM | POA: Diagnosis not present

## 2023-02-10 DIAGNOSIS — R000 Tachycardia, unspecified: Secondary | ICD-10-CM | POA: Diagnosis not present

## 2023-02-10 DIAGNOSIS — Z510 Encounter for antineoplastic radiation therapy: Secondary | ICD-10-CM | POA: Diagnosis not present

## 2023-02-10 DIAGNOSIS — C3490 Malignant neoplasm of unspecified part of unspecified bronchus or lung: Secondary | ICD-10-CM | POA: Diagnosis present

## 2023-02-10 DIAGNOSIS — G893 Neoplasm related pain (acute) (chronic): Secondary | ICD-10-CM | POA: Diagnosis not present

## 2023-02-10 DIAGNOSIS — C786 Secondary malignant neoplasm of retroperitoneum and peritoneum: Secondary | ICD-10-CM | POA: Diagnosis not present

## 2023-02-10 LAB — PROTIME-INR
INR: 1.1 (ref 0.8–1.2)
Prothrombin Time: 14.1 seconds (ref 11.4–15.2)

## 2023-02-10 LAB — CBC WITH DIFFERENTIAL/PLATELET
Abs Immature Granulocytes: 0.08 10*3/uL — ABNORMAL HIGH (ref 0.00–0.07)
Basophils Absolute: 0 10*3/uL (ref 0.0–0.1)
Basophils Relative: 0 %
Eosinophils Absolute: 0.2 10*3/uL (ref 0.0–0.5)
Eosinophils Relative: 2 %
HCT: 36.9 % (ref 36.0–46.0)
Hemoglobin: 11.6 g/dL — ABNORMAL LOW (ref 12.0–15.0)
Immature Granulocytes: 1 %
Lymphocytes Relative: 7 %
Lymphs Abs: 0.7 10*3/uL (ref 0.7–4.0)
MCH: 24.7 pg — ABNORMAL LOW (ref 26.0–34.0)
MCHC: 31.4 g/dL (ref 30.0–36.0)
MCV: 78.5 fL — ABNORMAL LOW (ref 80.0–100.0)
Monocytes Absolute: 1.2 10*3/uL — ABNORMAL HIGH (ref 0.1–1.0)
Monocytes Relative: 12 %
Neutro Abs: 8.1 10*3/uL — ABNORMAL HIGH (ref 1.7–7.7)
Neutrophils Relative %: 78 %
Platelets: 312 10*3/uL (ref 150–400)
RBC: 4.7 MIL/uL (ref 3.87–5.11)
RDW: 19.4 % — ABNORMAL HIGH (ref 11.5–15.5)
WBC: 10.4 10*3/uL (ref 4.0–10.5)
nRBC: 0 % (ref 0.0–0.2)

## 2023-02-10 LAB — LACTIC ACID, PLASMA
Lactic Acid, Venous: 1.8 mmol/L (ref 0.5–1.9)
Lactic Acid, Venous: 1.2 mmol/L (ref 0.5–1.9)

## 2023-02-10 LAB — COMPREHENSIVE METABOLIC PANEL
ALT: 22 U/L (ref 0–44)
AST: 47 U/L — ABNORMAL HIGH (ref 15–41)
Albumin: 3.3 g/dL — ABNORMAL LOW (ref 3.5–5.0)
Alkaline Phosphatase: 506 U/L — ABNORMAL HIGH (ref 38–126)
Anion gap: 13 (ref 5–15)
BUN: 12 mg/dL (ref 6–20)
CO2: 20 mmol/L — ABNORMAL LOW (ref 22–32)
Calcium: 8.7 mg/dL — ABNORMAL LOW (ref 8.9–10.3)
Chloride: 98 mmol/L (ref 98–111)
Creatinine, Ser: 0.58 mg/dL (ref 0.44–1.00)
GFR, Estimated: >60 mL/min (ref >60)
Glucose, Bld: 140 mg/dL — ABNORMAL HIGH (ref 70–99)
Potassium: 3.8 mmol/L (ref 3.5–5.1)
Sodium: 131 mmol/L — ABNORMAL LOW (ref 135–145)
Total Bilirubin: 1 mg/dL (ref 0.3–1.2)
Total Protein: 7.4 g/dL (ref 6.5–8.1)

## 2023-02-10 LAB — TROPONIN I (HIGH SENSITIVITY): Troponin I (High Sensitivity): 9 ng/L (ref <18)

## 2023-02-10 MED ORDER — LACTATED RINGERS IV SOLN: INTRAVENOUS | Status: DC

## 2023-02-10 MED ORDER — HYDROMORPHONE HCL 1 MG/ML IJ SOLN
0.5000 mg | Freq: Once | INTRAMUSCULAR | Status: AC
  Administered 2023-02-10: 0.5 mg via INTRAVENOUS
  Filled 2023-02-10: qty 0.5

## 2023-02-10 MED ORDER — IOHEXOL 350 MG/ML SOLN
75.0000 mL | Freq: Once | INTRAVENOUS | Status: AC | PRN
  Administered 2023-02-10: 75 mL via INTRAVENOUS

## 2023-02-10 MED ORDER — ACETAMINOPHEN 325 MG PO TABS
650.0000 mg | ORAL_TABLET | ORAL | Status: DC
  Administered 2023-02-10 – 2023-02-11 (×4): 650 mg via ORAL
  Filled 2023-02-10 (×4): qty 2

## 2023-02-10 MED ORDER — ACETAMINOPHEN 325 MG PO TABS: 650.0000 mg | ORAL_TABLET | Freq: Four times a day (QID) | ORAL | Status: DC | PRN

## 2023-02-10 MED ORDER — CELECOXIB 200 MG PO CAPS
200.0000 mg | ORAL_CAPSULE | Freq: Two times a day (BID) | ORAL | Status: DC
  Administered 2023-02-10 – 2023-02-11 (×2): 200 mg via ORAL
  Filled 2023-02-10 (×2): qty 1

## 2023-02-10 MED ORDER — DILTIAZEM HCL 25 MG/5ML IV SOLN
15.0000 mg | Freq: Once | INTRAVENOUS | Status: AC
  Administered 2023-02-10: 15 mg via INTRAVENOUS
  Filled 2023-02-10: qty 5

## 2023-02-10 MED ORDER — HYDROMORPHONE HCL 1 MG/ML IJ SOLN
1.0000 mg | INTRAMUSCULAR | Status: DC | PRN
  Administered 2023-02-10 – 2023-02-11 (×3): 1 mg via INTRAVENOUS
  Filled 2023-02-10 (×3): qty 1

## 2023-02-10 MED ORDER — ENOXAPARIN SODIUM 40 MG/0.4ML IJ SOSY
40.0000 mg | PREFILLED_SYRINGE | INTRAMUSCULAR | Status: DC
  Administered 2023-02-10: 40 mg via SUBCUTANEOUS
  Filled 2023-02-10: qty 0.4

## 2023-02-10 MED ORDER — OXYCODONE HCL 5 MG PO TABS
5.0000 mg | ORAL_TABLET | ORAL | Status: DC | PRN
  Administered 2023-02-11 (×2): 5 mg via ORAL
  Filled 2023-02-10 (×2): qty 1

## 2023-02-10 MED ORDER — HYDROMORPHONE HCL 1 MG/ML IJ SOLN
1.0000 mg | Freq: Once | INTRAMUSCULAR | Status: AC
  Administered 2023-02-10: 1 mg via INTRAVENOUS
  Filled 2023-02-10: qty 1

## 2023-02-10 MED ORDER — CALCIUM GLUCONATE-NACL 1-0.675 GM/50ML-% IV SOLN
1.0000 g | Freq: Once | INTRAVENOUS | Status: AC
  Administered 2023-02-10: 1000 mg via INTRAVENOUS
  Filled 2023-02-10: qty 50

## 2023-02-10 MED ORDER — ACETAMINOPHEN 325 MG RE SUPP
650.0000 mg | Freq: Four times a day (QID) | RECTAL | Status: DC | PRN
Start: 2023-02-10 — End: 2023-02-10

## 2023-02-10 MED ORDER — OXYCODONE HCL ER 15 MG PO T12A
15.0000 mg | EXTENDED_RELEASE_TABLET | Freq: Two times a day (BID) | ORAL | Status: DC
  Administered 2023-02-10: 15 mg via ORAL
  Filled 2023-02-10: qty 1

## 2023-02-10 MED ORDER — ONDANSETRON HCL 4 MG/2ML IJ SOLN
4.0000 mg | Freq: Once | INTRAMUSCULAR | Status: AC
  Administered 2023-02-10: 4 mg via INTRAVENOUS
  Filled 2023-02-10: qty 2

## 2023-02-10 MED ORDER — METOPROLOL TARTRATE 5 MG/5ML IV SOLN
5.0000 mg | Freq: Once | INTRAVENOUS | Status: AC
  Administered 2023-02-10: 5 mg via INTRAVENOUS
  Filled 2023-02-10: qty 5

## 2023-02-10 MED ORDER — SODIUM CHLORIDE 0.9 % IV BOLUS
1000.0000 mL | Freq: Once | INTRAVENOUS | Status: AC
  Administered 2023-02-10: 1000 mL via INTRAVENOUS

## 2023-02-10 MED ORDER — LORAZEPAM 2 MG/ML IJ SOLN
1.0000 mg | Freq: Once | INTRAMUSCULAR | Status: AC
  Administered 2023-02-10: 1 mg via INTRAVENOUS
  Filled 2023-02-10: qty 1

## 2023-02-10 NOTE — ED Notes (Signed)
Pt transported to CT ?

## 2023-02-10 NOTE — ED Notes (Signed)
Ok to not draw second set of cultures per MD Ray

## 2023-02-10 NOTE — Assessment & Plan Note (Signed)
Stage IV Metastatic, Nonsquamous, Molecular Analysis Completed, Molecular Alteration Present and Targeted Therapy Exhausted OR KRAS G12C+ or HER2+ Present and No Prior Chemo/Immunotherapy OR No Alteration Present, Second Line -  Chemotherapy/Immunotherapy, PS = 0, 1, No Prior PD-1/PD-L1  Inhibitor or Prior PD-1/PD-L1 Inhibitor + Chemotherapy, and Not a Candidate for Immunotherapy Therapeutic Status: Stage IV Metastatic Histology: Nonsquamous Cell Broad Molecular Profiling Status: Molecular Analysis Completed Molecular Analysis Results: Alteration Present and Targeted Therapy Exhausted ECOG Performance Status: 1 Chemotherapy/Immunotherapy Line of Therapy: Second Line Chemotherapy/Immunotherapy Immunotherapy Candidate Status: Not a Candidate for Immunotherapy Prior Immunotherapy Status: Prior PD-1/PD-L1 Inhibitor + Chemotherapy Intent of Therapy: Non-Curative / Palliative Intent,

## 2023-02-10 NOTE — ED Provider Notes (Signed)
Care of this patient assumed from prior physician at 1530 pending CTA, reevaluation, and disposition. Please see prior physician note for further details.  Briefly this is a 57 year old female with history of metastatic lung cancer presenting for increased pain including in her upper back.  On presentation she was noted to be significantly tachycardic with heart rates in the 150s, has remained persistently tachycardic after fluid resuscitation and pain control, appears to be in sinus rhythm.  She was ordered for CTA of the chest to further evaluate, pending at time of sign out.  CT of the chest is without identifiable PE.  There is some fat stranding around the right thoracic inlet and axilla noted as well as previously noted metastatic changes.  I did examine this area on physical exam.  There is a small area of well-circumscribed erythema that seems to be a mild skin reaction to possibly a bandage, but otherwise no significant erythema, warmth, overlying skin changes.  Patient remained persistently with a heart rate around 150 with minimal heart rate variability including with movement, increasing pain.  I did review her EKGs and these do appear most consistent with sinus tachycardia, but I am concerned about possible atrial flutter given the persistence of her heart rate around 150.  I did trial a diltiazem bolus of 15 mg which did not have any significant impact on patient's heart rate, remained at 150.  Did note that her calcium was slightly low.  She is given a gram of calcium gluconate and 5 mg of IV metoprolol.  With this, she had improvement in her heart rates down to the 120s.  Unclear exact etiology of her persistent tachycardia, but with her ongoing pain and unexplained tachycardia, do think she is appropriate for admission.  Will reach out to hospitalist team.  Case reviewed with hospitalist team.  They will evaluate the patient for anticipated admission.   Trinna Post, MD 02/11/23 [phone]

## 2023-02-10 NOTE — Assessment & Plan Note (Signed)
Hospital medicine is called to evaluate patient for tachycardia, on review this seems to be sinus tachycardia.  At this time I will check a thyroid cascade to rule out hyperthyroidism as well as there is no concern for infection with no white count no fever.  The principal concern is that patient is having uncontrolled pain that started last evening in the low back.  Therefore I have ordered multimodal pain regimen including acetaminophen and Celebrex long-acting oxycodone as needed oxycodone as well as IV Dilaudid for severe pain.  We will monitor pain scale every 1 hour and uptitrate as needed.  Maintain patient on pulse oximetry for this.  Patient at this time has declined my offer for imaging of the low back, once patient's pain is controlled I would like to evaluate patient's low back on exam and then proceed with ideally an MRI. Discucssed with patient and husabnd at bedside in detail.

## 2023-02-10 NOTE — H&P (Addendum)
History and Physical    Patient: Gina Sanchez:[id number] DOB: Jan 25, 1966 DOA: 02/10/2023 DOS: the patient was seen and examined on 02/10/2023 PCP: Armando Gang, FNP  Patient coming from: Home  Chief Complaint:  Chief Complaint  Patient presents with   Back Pain   HPI: Gina Sanchez is a 57 y.o. female with medical history significant of non-small cell metastatic lung cancer.  Patient is getting treatment with chemotherapy as well as radiation to back.  Patient has had chronic on and off low back pain.  Which has been managed with a Duragesic patch.  Pain slightly increased over the last couple of days in the lower back especially with ambulation and therefore the Duragesic patch was increased to 25 mcg/h.  Per patient.  However last evening patient reports an abrupt onset of worsening low back pain in the middle without any focal weakness without any loss of bladder or bowel without any trauma.  Without any radiation.  Pain is severe patient is restless not having vomiting or diarrhea.  Patient has chronic constipation, patient is still passing gas.  Patient came to the ER because of sudden onset worsening low back pain  ER course is notable for patient receiving several doses of opiates, patient also seems to have complaint of upper back pain to the ER attending's previously which was evaluated by CAT scan of the chest.  At this time patient is not complaining of any upper back pain.  Medical evaluation is sought Review of Systems: As mentioned in the history of present illness. All other systems reviewed and are negative. 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   Palpitations    Precordial chest pain    Wrist fracture 03/2021   left   Past  Surgical History:  Procedure Laterality Date   BREAST BIOPSY Right 2017   benign   CERVICAL BIOPSY  W/ LOOP ELECTRODE EXCISION     COLONOSCOPY  06/11/2022   Procedure: COLONOSCOPY;  Surgeon: Toney Reil, MD;  Location: [facility] ENDOSCOPY;  Service: Gastroenterology;;   COLONOSCOPY WITH PROPOFOL N/A 07/03/2015   Procedure: COLONOSCOPY WITH PROPOFOL;  Surgeon: Wallace Cullens, MD;  Location: [facility] ENDOSCOPY;  Service: Gastroenterology;  Laterality: N/A;   COLONOSCOPY WITH PROPOFOL N/A 01/06/2022   Procedure: COLONOSCOPY WITH PROPOFOL;  Surgeon: Toney Reil, MD;  Location: [facility] ENDOSCOPY;  Service: Gastroenterology;  Laterality: N/A;   ESOPHAGOGASTRODUODENOSCOPY (EGD) WITH PROPOFOL N/A 07/03/2015   Procedure: ESOPHAGOGASTRODUODENOSCOPY (EGD) WITH PROPOFOL;  Surgeon: Wallace Cullens, MD;  Location: [facility] ENDOSCOPY;  Service: Gastroenterology;  Laterality: N/A;   FLEXIBLE SIGMOIDOSCOPY N/A 03/12/2022   Procedure: FLEXIBLE SIGMOIDOSCOPY;  Surgeon: Toney Reil, MD;  Location: [facility] ENDOSCOPY;  Service: Gastroenterology;  Laterality: N/A;   FLEXIBLE SIGMOIDOSCOPY N/A 08/17/2022   Procedure: FLEXIBLE SIGMOIDOSCOPY;  Surgeon: Toney Reil, MD;  Location: [facility] ENDOSCOPY;  Service: Gastroenterology;  Laterality: N/A;   FRACTURE SURGERY     INTRAMEDULLARY (IM) NAIL INTERTROCHANTERIC Right 09/15/2020   Procedure: INTRAMEDULLARY (IM) NAIL INTERTROCHANTRIC;  Surgeon: Christena Flake, MD;  Location: [facility] ORS;  Service: Orthopedics;  Laterality: Right;   IR CV LINE INJECTION  07/14/2022   IR IMAGING GUIDED PORT INSERTION  11/28/2020   LEFT HEART CATH AND CORONARY ANGIOGRAPHY N/A 03/28/2017   Procedure: Left Heart Cath and Coronary Angiography;  Surgeon: Runell Gess, MD;  Location: [facility] INVASIVE CV LAB;  Service: Cardiovascular;  Laterality: N/A;   ORIF WRIST FRACTURE Left 03/31/2021   Procedure: OPEN REDUCTION INTERNAL FIXATION (ORIF) LEFT DISTAL RADIUS FRACTURE.;  Surgeon: Christena Flake, MD;   Location: [facility] ORS;  Service: Orthopedics;  Laterality: Left;   Social History:  reports that she quit smoking about 2 years ago. Her smoking use included cigarettes. She has a 30.00 pack-year smoking history. She has never used smokeless tobacco. She reports that she does not currently use alcohol after a past usage of about 1.0 standard drink of alcohol per week. She reports that she does not use drugs.  Allergies  Allergen Reactions   Factive [Gemifloxacin] Rash    Family History  Problem Relation Age of Onset   Diabetes Mother        alive @ 69   Goiter Mother    Heart disease Father        died of MI @ 30   Osteoporosis Maternal Grandmother    Colon cancer Maternal Grandfather    Breast cancer Neg Hx    Ovarian cancer Neg Hx     Prior to Admission medications   Medication Sig Start Date End Date Taking? Authorizing Provider  acetaminophen (TYLENOL) 500 MG tablet Take 500 mg by mouth every 6 (six) hours as needed.    [provider]  ALPRAZolam Prudy Feeler) 0.5 MG tablet TAKE ONE TABLET (0.5 MG) BY MOUTH EVERY DAY AS NEEDED 01/25/23   Creig Hines, MD  amoxicillin-clavulanate (AUGMENTIN) 875-125 MG tablet Take 1 tablet by mouth 2 (two) times daily. Patient not taking: Reported on 01/31/2023 12/17/22   Creig Hines, MD  citalopram (CELEXA) 10 MG tablet Take 10 mg by mouth daily. 09/01/22   [provider]  dicyclomine (BENTYL) 10 MG capsule Take 1 capsule (10 mg total) by mouth every 8 (eight) hours as needed for spasms. 10/26/22   Toney Reil, MD  fentaNYL (DURAGESIC) 25 MCG/HR Place 1 patch onto the skin every 3 (three) days. 02/08/23   Creig Hines, MD  gabapentin (NEURONTIN) 100 MG capsule Take 1 capsule (100 mg total) by mouth 3 (three) times daily. 01/17/23   Borders, Daryl Eastern, NP  hydrocortisone (CORTEF) 10 MG tablet TAKE 1 TABLET BY MOUTH NIGHTLY 01/25/23   Creig Hines, MD  hydrocortisone (CORTEF) 20 MG tablet TAKE 1 TABLET BY MOUTH EVERY MORNING  12/07/22   Creig Hines, MD  HYDROmorphone (DILAUDID) 2 MG tablet Take 1-2 tablets (2-4 mg total) by mouth every 4 (four) hours as needed for severe pain. 01/31/23   Creig Hines, MD  lidocaine (LIDODERM) 5 % Place 1 patch onto the skin daily. Remove & Discard patch within 12 hours or as directed by MD 01/17/23   Borders, Daryl Eastern, NP  metoprolol tartrate (LOPRESSOR) 25 MG tablet Take 25 mg by mouth in the morning. 11/11/20   [provider]  nitrofurantoin, macrocrystal-monohydrate, (MACROBID) 100 MG capsule Take 100 mg by mouth 2 (two) times daily. 11/05/22   [provider]  ondansetron (ZOFRAN) 4 MG tablet TAKE 1 TABLET BY MOUTH EVERY 8 HOURS AS NEEDED FOR NAUSEA AND VOMITING 12/29/22   Rushie Chestnut, PA-C  prochlorperazine (COMPAZINE) 10 MG tablet Take 1 tablet (10 mg total) by mouth every 6 (six) hours as needed for nausea  or vomiting. 12/17/22   Creig Hines, MD  sotorasib (LUMAKRAS) 320 MG tablet Take 3 tablets (960 mg total) by mouth daily. 01/11/23   Creig Hines, MD  valACYclovir (VALTREX) 1000 MG tablet Take 1 tablet (1,000 mg total) by mouth 3 (three) times daily. 09/20/22   Borders, Daryl Eastern, NP  Vedolizumab (ENTYVIO IV) Inject 1 Dose into the vein as directed. Every 4 weeks at her home    [provider]    Physical Exam: Vitals:   02/10/23 1700 02/10/23 1800 02/10/23 1837 02/10/23 1900  BP: (!) 133/97 128/84  124/79  Pulse: (!) 152 (!) 129    Resp: 19 20 (!) 22 (!) 29  Temp: 98.6 F (37 C)     SpO2: 93% 93%    Weight:      Height:       General: Patient is in moderate to rather severe distress because of the low back pain.  She is alternately moving her left and then right lower extremity in flexion and extension Respiratory exam: Bilateral air entry vesicular Cardiovascular regular exam S1-S2 normal tachycardia Abdomen soft nontender Extremities warm without edema distal function intact Musculoskeletal exam: Patient declined to be turned for  back exam.  Due to uncontrolled pain. Data Reviewed:  Labs on Admission:  Results for orders placed or performed during the hospital encounter of 02/10/23 (from the past 24 hour(s))  Comprehensive metabolic panel     Status: Abnormal   Collection Time: 02/10/23 10:40 AM  Result Value Ref Range   Sodium 131 (L) 135 - 145 mmol/L   Potassium 3.8 3.5 - 5.1 mmol/L   Chloride 98 98 - 111 mmol/L   CO2 20 (L) 22 - 32 mmol/L   Glucose, Bld 140 (H) 70 - 99 mg/dL   BUN 12 6 - 20 mg/dL   Creatinine, Ser 1.61 0.44 - 1.00 mg/dL   Calcium 8.7 (L) 8.9 - 10.3 mg/dL   Total Protein 7.4 6.5 - 8.1 g/dL   Albumin 3.3 (L) 3.5 - 5.0 g/dL   AST 47 (H) 15 - 41 U/L   ALT 22 0 - 44 U/L   Alkaline Phosphatase 506 (H) 38 - 126 U/L   Total Bilirubin 1.0 0.3 - 1.2 mg/dL   GFR, Estimated >09 >60 mL/min   Anion gap 13 5 - 15  Lactic acid, plasma     Status: None   Collection Time: 02/10/23 10:40 AM  Result Value Ref Range   Lactic Acid, Venous 1.8 0.5 - 1.9 mmol/L  CBC with Differential     Status: Abnormal   Collection Time: 02/10/23 10:40 AM  Result Value Ref Range   WBC 10.4 4.0 - 10.5 K/uL   RBC 4.70 3.87 - 5.11 MIL/uL   Hemoglobin 11.6 (L) 12.0 - 15.0 g/dL   HCT 45.4 09.8 - 11.9 %   MCV 78.5 (L) 80.0 - 100.0 fL   MCH 24.7 (L) 26.0 - 34.0 pg   MCHC 31.4 30.0 - 36.0 g/dL   RDW 14.7 (H) 82.9 - 56.2 %   Platelets 312 150 - 400 K/uL   nRBC 0.0 0.0 - 0.2 %   Neutrophils Relative % 78 %   Neutro Abs 8.1 (H) 1.7 - 7.7 K/uL   Lymphocytes Relative 7 %   Lymphs Abs 0.7 0.7 - 4.0 K/uL   Monocytes Relative 12 %   Monocytes Absolute 1.2 (H) 0.1 - 1.0 K/uL   Eosinophils Relative 2 %   Eosinophils Absolute 0.2 0.0 -  0.5 K/uL   Basophils Relative 0 %   Basophils Absolute 0.0 0.0 - 0.1 K/uL   Immature Granulocytes 1 %   Abs Immature Granulocytes 0.08 (H) 0.00 - 0.07 K/uL  Protime-INR     Status: None   Collection Time: 02/10/23 10:40 AM  Result Value Ref Range   Prothrombin Time 14.1 11.4 - 15.2 seconds    INR 1.1 0.8 - 1.2  Troponin I (High Sensitivity)     Status: None   Collection Time: 02/10/23 10:40 AM  Result Value Ref Range   Troponin I (High Sensitivity) 9 <18 ng/L  Lactic acid, plasma     Status: None   Collection Time: 02/10/23 12:58 PM  Result Value Ref Range   Lactic Acid, Venous 1.2 0.5 - 1.9 mmol/L   Basic Metabolic Panel: Recent Labs  Lab 02/10/23 1040  NA 131*  K 3.8  CL 98  CO2 20*  GLUCOSE 140*  BUN 12  CREATININE 0.58  CALCIUM 8.7*   Liver Function Tests: Recent Labs  Lab 02/10/23 1040  AST 47*  ALT 22  ALKPHOS 506*  BILITOT 1.0  PROT 7.4  ALBUMIN 3.3*   No results for input(s): "LIPASE", "AMYLASE" in the last 168 hours. No results for input(s): "AMMONIA" in the last 168 hours. CBC: Recent Labs  Lab 02/10/23 1040  WBC 10.4  NEUTROABS 8.1*  HGB 11.6*  HCT 36.9  MCV 78.5*  PLT 312   Cardiac Enzymes: Recent Labs  Lab 02/10/23 1040  TROPONINIHS 9    BNP (last 3 results) No results for input(s): "PROBNP" in the last 8760 hours. CBG: No results for input(s): "GLUCAP" in the last 168 hours.  Radiological Exams on Admission:  CT Angio Chest PE W and/or Wo Contrast  Result Date: 02/10/2023 CLINICAL DATA:  Pulmonary embolism (PE) suspected, high prob EXAM: CT ANGIOGRAPHY CHEST WITH CONTRAST TECHNIQUE: Multidetector CT imaging of the chest was performed using the standard protocol during bolus administration of intravenous contrast. Multiplanar CT image reconstructions and MIPs were obtained to evaluate the vascular anatomy. RADIATION DOSE REDUCTION: This exam was performed according to the departmental dose-optimization program which includes automated exposure control, adjustment of the mA and/or kV according to patient size and/or use of iterative reconstruction technique. CONTRAST:  75mL OMNIPAQUE IOHEXOL 350 MG/ML SOLN COMPARISON:  Chest XR, 07/01/2020.  CT CAP, 02/03/2023. FINDINGS: Cardiovascular: Satisfactory opacification of the pulmonary  arteries to the segmental level. No segmental or larger pulmonary embolus. Normal heart size. No pericardial effusion. Mediastinum/Nodes: Similar degree of RIGHT paratracheal and BILATERAL hilar adenopathy, better appreciated and described on recent comparison CT CAP. Enlarged LEFT axillary lymph nodes. RIGHT jugular-approach chest port with catheter tip well-positioned at the superior cavoatrial junction. Thyroid gland, trachea, and esophagus demonstrate no significant findings. Lungs/Pleura: RIGHT perihilar and peribronchial thickening, unchanged. Mild linear bibasilar atelectasis. No new consolidation, mass or suspicious pulmonary nodule. No pleural effusion or pneumothorax. Upper Abdomen: No acute abnormality. Small 1.4 cm hypodensity of the hepatic dome, incompletely assessed. Musculoskeletal: New fat stranding about the RIGHT thoracic inlet and axilla, incompletely assessed. Multifocal osseous metastases involving the ribs and imaged thoracic spine. Review of the MIP images confirms the above findings. IMPRESSION: 1. No segmental or larger pulmonary embolus. No acute intrathoracic abnormality. 2. New fat stranding about the RIGHT thoracic inlet and axilla, incompletely assessed. Clinical correlation is recommended. 3. Similar malignant findings within chest, and including multifocal osseous metastases. Electronically Signed   By: Roanna Banning M.D.   On: 02/10/2023 16:58  EKG: Independently reviewed. Sinus tachycardia.   Assessment and Plan: * Sinus tachycardia Hospital medicine is called to evaluate patient for tachycardia, on review this seems to be sinus tachycardia.  At this time I will check a thyroid cascade to rule out hyperthyroidism as well as there is no concern for infection with no white count no fever.  The principal concern is that patient is having uncontrolled pain that started last evening in the low back.  Therefore I have ordered multimodal pain regimen including acetaminophen and  Celebrex long-acting oxycodone as needed oxycodone as well as IV Dilaudid for severe pain.  We will monitor pain scale every 1 hour and uptitrate as needed.  Maintain patient on pulse oximetry for this.  Patient at this time has declined my offer for imaging of the low back, once patient's pain is controlled I would like to evaluate patient's low back on exam and then proceed with ideally an MRI. Discucssed with patient and husabnd at bedside in detail.  Metastatic lung cancer (metastasis from lung to other site) (HCC) Stage IV Metastatic, Nonsquamous, Molecular Analysis Completed, Molecular Alteration Present and Targeted Therapy Exhausted OR KRAS G12C+ or HER2+ Present and No Prior Chemo/Immunotherapy OR No Alteration Present, Second Line -  Chemotherapy/Immunotherapy, PS = 0, 1, No Prior PD-1/PD-L1  Inhibitor or Prior PD-1/PD-L1 Inhibitor + Chemotherapy, and Not a Candidate for Immunotherapy Therapeutic Status: Stage IV Metastatic Histology: Nonsquamous Cell Broad Molecular Profiling Status: Molecular Analysis Completed Molecular Analysis Results: Alteration Present and Targeted Therapy Exhausted ECOG Performance Status: 1 Chemotherapy/Immunotherapy Line of Therapy: Second Line Chemotherapy/Immunotherapy Immunotherapy Candidate Status: Not a Candidate for Immunotherapy Prior Immunotherapy Status: Prior PD-1/PD-L1 Inhibitor + Chemotherapy Intent of Therapy: Non-Curative / Palliative Intent,  Please note patient on chronic steroids.  In case of hypotension or severe illness consider stress dose steroids    Advance Care Planning:   Code Status: Full Code   Consults: consider onc eval as needed in AM  Family Communication: husband at bedside.  Severity of Illness: The appropriate patient status for this patient is OBSERVATION. Observation status is judged to be reasonable and necessary in order to provide the required intensity of service to ensure the patient's safety. The patient's  presenting symptoms, physical exam findings, and initial radiographic and laboratory data in the context of their medical condition is felt to place them at decreased risk for further clinical deterioration. Furthermore, it is anticipated that the patient will be medically stable for discharge from the hospital within 2 midnights of admission.   Author: Nolberto Hanlon, MD 02/10/2023 8:22 PM  For on call review [email or web address].

## 2023-02-10 NOTE — ED Notes (Signed)
EKG printed for pt HR decrease to 129 after metoprolol.

## 2023-02-10 NOTE — ED Notes (Signed)
RN called CT to estimate on scan. CT states they will call when getting closer so RN can administer medication.

## 2023-02-10 NOTE — ED Notes (Signed)
Pt back from CT and appears uncomfortable in pain. Pt crying and states it hurts when she breaths. RN notified MD Ray for more pain medication.

## 2023-02-10 NOTE — ED Provider Notes (Signed)
Gina Sanchez Provider Note    Event Date/Time   First MD Initiated Contact with Patient 02/10/23 1042     (approximate)  History   Chief Complaint: Back Pain  HPI  Gina Sanchez is a 57 y.o. female with a past medical history of depression, hypertension, metastatic lung cancer with bony metastases presents to the emergency department for increased pain.  Patient states her pain medication was recently adjusted and she was just started on a fentanyl patch which she applied yesterday she also has home oral pain medication she is not sure if this is oxycodone or Dilaudid.  States despite taking her home medications her pain has worsened so she came to the emergency department.  Patient describes more of a generalized pain but states it is somewhat worse in the upper back.  Does state it is worse with movement but denies any pleuritic pain.  Patient denies any history of PE or DVT.  Patient states she had a recent bone scan yesterday but has not had the results of this yet.  Follows up with Dr. Smith Robert of oncology and is scheduled to start chemotherapy soon although is not on any chemotherapy currently.  Patient denies any shortness of breath denies any fever denies any cough or anterior chest pain.  Physical Exam   Triage Vital Signs: ED Triage Vitals  Enc Vitals Group     BP 02/10/23 1008 (!) 138/92     Pulse Rate 02/10/23 1008 (!) 156     Resp 02/10/23 1008 (!) 22     Temp 02/10/23 1007 98.2 F (36.8 C)     Temp src --      SpO2 02/10/23 1008 100 %     Weight 02/10/23 1007 170 lb (77.1 kg)     Height 02/10/23 1007 5\' 6"  (1.676 m)     Head Circumference --      Peak Flow --      Pain Score 02/10/23 1007 10     Pain Loc --      Pain Edu? --      Excl. in GC? --     Most recent vital signs: Vitals:   02/10/23 1008 02/10/23 1100  BP: (!) 138/92 (!) 145/91  Pulse: (!) 156 (!) 142  Resp: (!) 22 (!) 26  Temp:    SpO2: 100% 93%    General: Awake, no  distress.  CV:  Good peripheral perfusion.  Regular rate and rhythm  Resp:  Normal effort.  Equal breath sounds bilaterally.  Abd:  No distention.  Soft, nontender.  No rebound or guarding.  ED Results / Procedures / Treatments   EKG  EKG viewed and interpreted by myself shows sinus tachycardia 142 bpm with a narrow QRS, normal axis, normal intervals, no concerning ST changes.  RADIOLOGY  CTA pending   MEDICATIONS ORDERED IN ED: Medications  sodium chloride 0.9 % bolus 1,000 mL (1,000 mLs Intravenous New Bag/Given 02/10/23 1126)  HYDROmorphone (DILAUDID) injection 1 mg (1 mg Intravenous Given 02/10/23 1127)  ondansetron (ZOFRAN) injection 4 mg (4 mg Intravenous Given 02/10/23 1128)     IMPRESSION / MDM / ASSESSMENT AND PLAN / ED COURSE  I reviewed the triage vital signs and the nursing notes.  Patient's presentation is most consistent with acute presentation with potential threat to life or bodily function.  Patient presents to the emergency department for worsening pain which she describes as pretty much all over but worse in the upper back.  No pleuritic nature to the pain no anterior chest pain.  Patient is tachycardic around 140 bpm but is in pain and also has been states she has not been eating or drinking much over the last few days due to increased pain and possibly her pain medication.  We will check labs including cardiac enzyme.  We will IV hydrate and treat pain.  If patient improves with IV hydration and pain medication I believe the patient would be safe for outpatient follow-up however if her heart rate remains elevated despite hydration and pain medication we may need to consider CTA imaging of the chest to rule out PE although the patient wishes to avoid this if possible.  Patient's basic lab work shows a reassuring CBC with a normal white blood cell count lactic acid slightly elevated at 1.8, chemistry overall reassuring besides slight hyponatremia.  Patient receiving  normal saline.  Patient remains tachycardic around 140 bpm despite pain medicine and fluids.  Will obtain a CTA of the chest as a precaution to rule out pulmonary embolism.  Patient agreeable to plan of care.  She does state significant anxiety we will dose IV Ativan prior to CTA imaging.  Patient states her pain is much more manageable currently.  CTA is pending, patient care signed out to oncoming provider.  Troponin reassuringly negative  FINAL CLINICAL IMPRESSION(S) / ED DIAGNOSES   Pain Back pain Tachycardia  Note:  This document was prepared using Dragon voice recognition software and may include unintentional dictation errors.   Gina Antis, MD 02/10/23 [id number]

## 2023-02-10 NOTE — ED Notes (Signed)
Pt states she urinated, RN not present and able to give pt urine cup for sample. Will obtain next time pt urinates.

## 2023-02-10 NOTE — ED Triage Notes (Signed)
Pt to ED for right sided back pain started last night. States has scans yesterday and had dye injected. Pt appears uncomfortable.  Lung cancer pt.

## 2023-02-11 ENCOUNTER — Other Ambulatory Visit: Payer: 59

## 2023-02-11 ENCOUNTER — Encounter: Payer: Self-pay | Admitting: Internal Medicine

## 2023-02-11 ENCOUNTER — Observation Stay: Payer: 59

## 2023-02-11 ENCOUNTER — Ambulatory Visit: Payer: 59

## 2023-02-11 ENCOUNTER — Inpatient Hospital Stay (HOSPITAL_BASED_OUTPATIENT_CLINIC_OR_DEPARTMENT_OTHER): Payer: 59 | Admitting: Hospice and Palliative Medicine

## 2023-02-11 ENCOUNTER — Ambulatory Visit: Payer: 59 | Admitting: Oncology

## 2023-02-11 DIAGNOSIS — R000 Tachycardia, unspecified: Secondary | ICD-10-CM | POA: Diagnosis not present

## 2023-02-11 DIAGNOSIS — C3491 Malignant neoplasm of unspecified part of right bronchus or lung: Secondary | ICD-10-CM

## 2023-02-11 DIAGNOSIS — G893 Neoplasm related pain (acute) (chronic): Secondary | ICD-10-CM

## 2023-02-11 LAB — CBC
HCT: 31.8 % — ABNORMAL LOW (ref 36.0–46.0)
Hemoglobin: 9.8 g/dL — ABNORMAL LOW (ref 12.0–15.0)
MCH: 24.4 pg — ABNORMAL LOW (ref 26.0–34.0)
MCHC: 30.8 g/dL (ref 30.0–36.0)
MCV: 79.1 fL — ABNORMAL LOW (ref 80.0–100.0)
Platelets: 252 10*3/uL (ref 150–400)
RBC: 4.02 MIL/uL (ref 3.87–5.11)
RDW: 19.7 % — ABNORMAL HIGH (ref 11.5–15.5)
WBC: 8.6 10*3/uL (ref 4.0–10.5)
nRBC: 0 % (ref 0.0–0.2)

## 2023-02-11 LAB — BASIC METABOLIC PANEL
Anion gap: 7 (ref 5–15)
BUN: 11 mg/dL (ref 6–20)
CO2: 24 mmol/L (ref 22–32)
Calcium: 8.5 mg/dL — ABNORMAL LOW (ref 8.9–10.3)
Chloride: 105 mmol/L (ref 98–111)
Creatinine, Ser: 0.58 mg/dL (ref 0.44–1.00)
GFR, Estimated: >60 mL/min (ref >60)
Glucose, Bld: 128 mg/dL — ABNORMAL HIGH (ref 70–99)
Potassium: 4.1 mmol/L (ref 3.5–5.1)
Sodium: 136 mmol/L (ref 135–145)

## 2023-02-11 LAB — TSH: TSH: 3.043 u[IU]/mL (ref 0.350–4.500)

## 2023-02-11 LAB — T4, FREE: Free T4: 1.02 ng/dL (ref 0.61–1.12)

## 2023-02-11 MED ORDER — ONDANSETRON HCL 4 MG PO TABS: 4.0000 mg | ORAL_TABLET | Freq: Three times a day (TID) | ORAL | Status: DC | PRN

## 2023-02-11 MED ORDER — LIDOCAINE 5 % EX PTCH
1.0000 | MEDICATED_PATCH | CUTANEOUS | Status: DC
  Filled 2023-02-11: qty 1

## 2023-02-11 MED ORDER — GABAPENTIN 100 MG PO CAPS
100.0000 mg | ORAL_CAPSULE | Freq: Three times a day (TID) | ORAL | Status: DC
  Administered 2023-02-11: 100 mg via ORAL
  Filled 2023-02-11: qty 1

## 2023-02-11 MED ORDER — ALPRAZOLAM 0.5 MG PO TABS: 0.5000 mg | ORAL_TABLET | Freq: Every day | ORAL | Status: DC | PRN

## 2023-02-11 MED ORDER — HYDROCORTISONE 10 MG PO TABS
20.0000 mg | ORAL_TABLET | Freq: Every morning | ORAL | Status: DC
  Administered 2023-02-11: 20 mg via ORAL
  Filled 2023-02-11: qty 2

## 2023-02-11 MED ORDER — HYDROCORTISONE 10 MG PO TABS
10.0000 mg | ORAL_TABLET | Freq: Every day | ORAL | Status: DC
  Filled 2023-02-11: qty 1

## 2023-02-11 MED ORDER — NITROFURANTOIN MONOHYD MACRO 100 MG PO CAPS
100.0000 mg | ORAL_CAPSULE | Freq: Two times a day (BID) | ORAL | Status: DC
  Administered 2023-02-11 (×2): 100 mg via ORAL
  Filled 2023-02-11 (×2): qty 1

## 2023-02-11 MED ORDER — METOPROLOL TARTRATE 25 MG PO TABS
25.0000 mg | ORAL_TABLET | Freq: Every morning | ORAL | Status: DC
  Administered 2023-02-11: 25 mg via ORAL
  Filled 2023-02-11: qty 1

## 2023-02-11 NOTE — ED Notes (Signed)
Pt discharge to home. Pt VSS, GCS 15, NAD. Pt verbalized understanding of discharge instructions with no additional questions at this time.  

## 2023-02-11 NOTE — Progress Notes (Signed)
Virtual Visit via Telephone Note  I connected with Gina Sanchez on 02/11/23 at 11:50 AM EDT by telephone and verified that I am speaking with the correct person using two identifiers.  Location: Patient: Home Provider: Clinic   I discussed the limitations, risks, security and privacy concerns of performing an evaluation and management service by telephone and the availability of in person appointments. I also discussed with the patient that there may be a patient responsible charge related to this service. The patient expressed understanding and agreed to proceed.   History of Present Illness: Gina Sanchez is a 57 y.o. 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      Observations/Objective: CT of the chest abdomen pelvis on 12/15/2022 showed mixed response with improved pulmonary nodules and thoracic adenopathy but new widespread sclerotic osseous mets.  Patient had bone scan on 02/09/2023 with results pending.  She was briefly admitted to the hospital yesterday and discharged today due to worsening back pain.  Patient had dose increase of transdermal fentanyl 25 mcg but this was just applied yesterday.  CTA of the chest in the hospital was unrevealing.  MRI of the spine was offered but patient declined due to pain.  Today, patient reports that pain is improved.  Again, higher dose of transdermal fentanyl was just applied yesterday and so patient is likely just now receiving full effect.  She has hydromorphone tablets which she takes about every 4 hours.  She says she is generally taking 1 but at times will take a second or third tablet pain is severe.  Patient reports that she is tolerating pain medications well and denies adverse effects.  Patient denies other symptomatic complaints or concerns today.  Assessment and Plan: Neoplasm related pain -continue fentanyl/hydromorphone.  Likely too early  for further dose adjustment of transdermal fentanyl but would have a low threshold next week for increasing fentanyl to 50 mcg if needed.  Continue hydromorphone as needed for breakthrough pain.  Await results of bone scan.  Follow Up Instructions: Follow-up telephone visit 2 to 3 weeks   I discussed the assessment and treatment plan with the patient. The patient was provided an opportunity to ask questions and all were answered. The patient agreed with the plan and demonstrated an understanding of the instructions.   The patient was advised to call back or seek an in-person evaluation if the symptoms worsen or if the condition fails to improve as anticipated.  I provided 10 minutes of non-face-to-face time during this encounter.   Malachy Moan, NP

## 2023-02-11 NOTE — ED Notes (Signed)
Assumed care from Elizabeth,RN. Pt resting comfortably in bed at this time. Pt denies any current needs or questions. Call light with in reach.   

## 2023-02-11 NOTE — Discharge Summary (Signed)
Gina Sanchez ZOX:[id number] DOB: Mar 21, 1966 DOA: 02/10/2023  PCP: Armando Gang, FNP  Admit date: 02/10/2023 Discharge date: 02/11/2023  Time spent: 35 minutes  Recommendations for Outpatient Follow-up:  Oncology f/u as scheduled next week     Discharge Diagnoses:  Principal Problem:   Sinus tachycardia Active Problems:   Essential hypertension   Metastatic lung cancer (metastasis from lung to other site) [facility])   Discharge Condition: stable  Diet recommendation: regular  Filed Weights   02/10/23 1007  Weight: 77.1 kg    History of present illness:  From admission h and p Gina Sanchez is a 57 y.o. female with medical history significant of non-small cell metastatic lung cancer.  Patient is getting treatment with chemotherapy as well as radiation to back.  Patient has had chronic on and off low back pain.  Which has been managed with a Duragesic patch.  Pain slightly increased over the last couple of days in the lower back especially with ambulation and therefore the Duragesic patch was increased to 25 mcg/h.  Per patient.  However last evening patient reports an abrupt onset of worsening low back pain in the middle without any focal weakness without any loss of bladder or bowel without any trauma.  Without any radiation.  Pain is severe patient is restless not having vomiting or diarrhea.  Patient has chronic constipation, patient is still passing gas.  Patient came to the ER because of sudden onset worsening low back pain   Hospital Course:  Patient with metastatic lung cancer, known meds to pelvis and spine, here with worsening right sided low back pain. Found to have sinus tachycardia. Treated with pain medications and IV fluids. Tachycardia much improved, hr now in the 90s. Pain better controlled, it appears to be secondary to her know osseous mets. No new or focal neurologic signs/symptoms. Plain films show signs of metastatic disease, no fracture. Offered patient MRI  but she declines. Discharge home with close oncology f/u.  Procedures: none   Consultations: none  Discharge Exam: Vitals:   02/11/23 0700 02/11/23 0800  BP: 100/74 98/71  Pulse: (!) 108 98  Resp: 18 17  Temp: 98 F (36.7 C) 98.2 F (36.8 C)  SpO2: 95% 93%    General: NAD Cardiovascular: RRR Respiratory: CTAB Neuro: 5/5 lower strength, distal sensation intact  Discharge Instructions   Discharge Instructions     Diet - low sodium heart healthy   Complete by: As directed    Increase activity slowly   Complete by: As directed       Allergies as of 02/11/2023       Reactions   Factive [gemifloxacin] Rash        Medication List     STOP taking these medications    amoxicillin-clavulanate 875-125 MG tablet Commonly known as: AUGMENTIN       TAKE these medications    acetaminophen 500 MG tablet Commonly known as: TYLENOL Take 500 mg by mouth every 6 (six) hours as needed.   ALPRAZolam 0.5 MG tablet Commonly known as: XANAX TAKE ONE TABLET (0.5 MG) BY MOUTH EVERY DAY AS NEEDED What changed:  how much to take how to take this when to take this reasons to take this   citalopram 10 MG tablet Commonly known as: CELEXA Take 10 mg by mouth daily.   dicyclomine 10 MG capsule Commonly known as: BENTYL Take 1 capsule (10 mg total) by mouth every 8 (eight) hours as needed for spasms.   ENTYVIO  IV Inject 1 Dose into the vein as directed. Every 4 weeks at her home   fentaNYL 25 MCG/HR Commonly known as: DURAGESIC Place 1 patch onto the skin every 3 (three) days.   gabapentin 100 MG capsule Commonly known as: Neurontin Take 1 capsule (100 mg total) by mouth 3 (three) times daily.   hydrocortisone 20 MG tablet Commonly known as: CORTEF TAKE 1 TABLET BY MOUTH EVERY MORNING   hydrocortisone 10 MG tablet Commonly known as: CORTEF TAKE 1 TABLET BY MOUTH NIGHTLY   HYDROmorphone 2 MG tablet Commonly known as: Dilaudid Take 1-2 tablets (2-4 mg  total) by mouth every 4 (four) hours as needed for severe pain.   lidocaine 5 % Commonly known as: LIDODERM Place 1 patch onto the skin daily. Remove & Discard patch within 12 hours or as directed by MD   metoprolol tartrate 25 MG tablet Commonly known as: LOPRESSOR Take 25 mg by mouth in the morning.   nitrofurantoin (macrocrystal-monohydrate) 100 MG capsule Commonly known as: MACROBID Take 100 mg by mouth 2 (two) times daily.   ondansetron 4 MG tablet Commonly known as: ZOFRAN TAKE 1 TABLET BY MOUTH EVERY 8 HOURS AS NEEDED FOR NAUSEA AND VOMITING What changed:  how much to take how to take this when to take this reasons to take this   prochlorperazine 10 MG tablet Commonly known as: COMPAZINE Take 1 tablet (10 mg total) by mouth every 6 (six) hours as needed for nausea or vomiting.   sotorasib 320 MG tablet Commonly known as: Lumakras Take 3 tablets (960 mg total) by mouth daily.   valACYclovir 1000 MG tablet Commonly known as: VALTREX Take 1 tablet (1,000 mg total) by mouth 3 (three) times daily.       Allergies  Allergen Reactions   Factive [Gemifloxacin] Rash    Follow-up Information     Creig Hines, MD Follow up.   Specialty: Oncology Why: as scheduled Contact information: [address] [place] [address] [phone]                  The results of significant diagnostics from this hospitalization (including imaging, microbiology, ancillary and laboratory) are listed below for reference.    Significant Diagnostic Studies: DG Lumbar Spine 2-3 Views  Result Date: 02/10/2023 CLINICAL DATA:  Back pain history of cancer EXAM: LUMBAR SPINE - 2-3 VIEW COMPARISON:  CT 02/03/2023 FINDINGS: Grade 1 anterolisthesis L3 on L4 and trace anterolisthesis L2 on L3. Sclerosis within multiple vertebral bodies as well as the pelvis consistent with metastatic disease. Vertebral body heights are maintained. Mild disc space narrowing and degenerative  change at L4-L5 and L5-S1. Facet degenerative changes of the mid to lower lumbar spine. IMPRESSION: 1. Mild degenerative changes of the lumbar spine with grade 1 anterolisthesis L3 on L4 and trace anterolisthesis L2 on L3. 2. Sclerosis within multiple vertebral bodies and pelvis consistent with metastatic disease. Electronically Signed   By: Jasmine Pang M.D.   On: 02/10/2023 22:06   CT Angio Chest PE W and/or Wo Contrast  Result Date: 02/10/2023 CLINICAL DATA:  Pulmonary embolism (PE) suspected, high prob EXAM: CT ANGIOGRAPHY CHEST WITH CONTRAST TECHNIQUE: Multidetector CT imaging of the chest was performed using the standard protocol during bolus administration of intravenous contrast. Multiplanar CT image reconstructions and MIPs were obtained to evaluate the vascular anatomy. RADIATION DOSE REDUCTION: This exam was performed according to the departmental dose-optimization program which includes automated exposure control, adjustment of the mA and/or kV according  to patient size and/or use of iterative reconstruction technique. CONTRAST:  75mL OMNIPAQUE IOHEXOL 350 MG/ML SOLN COMPARISON:  Chest XR, 07/01/2020.  CT CAP, 02/03/2023. FINDINGS: Cardiovascular: Satisfactory opacification of the pulmonary arteries to the segmental level. No segmental or larger pulmonary embolus. Normal heart size. No pericardial effusion. Mediastinum/Nodes: Similar degree of RIGHT paratracheal and BILATERAL hilar adenopathy, better appreciated and described on recent comparison CT CAP. Enlarged LEFT axillary lymph nodes. RIGHT jugular-approach chest port with catheter tip well-positioned at the superior cavoatrial junction. Thyroid gland, trachea, and esophagus demonstrate no significant findings. Lungs/Pleura: RIGHT perihilar and peribronchial thickening, unchanged. Mild linear bibasilar atelectasis. No new consolidation, mass or suspicious pulmonary nodule. No pleural effusion or pneumothorax. Upper Abdomen: No acute  abnormality. Small 1.4 cm hypodensity of the hepatic dome, incompletely assessed. Musculoskeletal: New fat stranding about the RIGHT thoracic inlet and axilla, incompletely assessed. Multifocal osseous metastases involving the ribs and imaged thoracic spine. Review of the MIP images confirms the above findings. IMPRESSION: 1. No segmental or larger pulmonary embolus. No acute intrathoracic abnormality. 2. New fat stranding about the RIGHT thoracic inlet and axilla, incompletely assessed. Clinical correlation is recommended. 3. Similar malignant findings within chest, and including multifocal osseous metastases. Electronically Signed   By: Roanna Banning M.D.   On: 02/10/2023 16:58   CT CHEST ABDOMEN PELVIS W CONTRAST  Result Date: 02/04/2023 CLINICAL DATA:  Right lung cancer, adenocarcinoma. Development of skin nodules. Nodules of right shoulder and left axilla. * Tracking Code: BO * EXAM: CT CHEST, ABDOMEN, AND PELVIS WITH CONTRAST TECHNIQUE: Multidetector CT imaging of the chest, abdomen and pelvis was performed following the standard protocol during bolus administration of intravenous contrast. RADIATION DOSE REDUCTION: This exam was performed according to the departmental dose-optimization program which includes automated exposure control, adjustment of the mA and/or kV according to patient size and/or use of iterative reconstruction technique. CONTRAST:  OMNIPAQUE IOHEXOL 300 MG/ML  SOLN COMPARISON:  12/15/2022 FINDINGS: CT CHEST FINDINGS Cardiovascular: Right Port-A-Cath tip high right atrium. Aortic atherosclerosis. Tortuous thoracic aorta. Normal heart size, without pericardial effusion. Multivessel coronary artery atherosclerosis. No central pulmonary embolism, on this non-dedicated study. Mediastinum/Nodes: Right supraclavicular node measures 7 mm on 06/02 and is enlarged from 4-5 mm on the prior. Posterior left axillary node measures 1.5 cm on 10/02 versus 8 mm on the prior (when remeasured).  Right paratracheal ill-defined soft tissue density including on 17/2 is similar, difficult to quantify secondary to ill definition of soft tissue planes in this region. Left hilar node measures 1.2 cm in 25/2 and is newly enlarged. Tiny hiatal hernia. Nodularity in the epicardial fat including at up to 7 mm on 48/2, increased from 1-2 mm on the prior. Lungs/Pleura: No pleural fluid. Increase in right perihilar and paramediastinal consolidation and volume loss. The right lower lobe area of dense consolidation medially has improved and the right lower lobe airspace and ground-glass opacities have resolved. A 2 mm left upper lobe pulmonary nodule on 46/4 is unchanged. The posterior left upper lobe pulmonary nodule detailed on the prior exam measures 4 mm on 55/4 today versus 5 mm on the prior. Musculoskeletal: Anterior right chest wall subcutaneous nodule of 8 mm on 19/2 measured 4 mm on the prior. Diffuse sclerotic osseous metastasis are similar. CT ABDOMEN PELVIS FINDINGS Hepatobiliary: Focal steatosis adjacent the falciform ligament. Normal gallbladder, without biliary ductal dilatation. Pancreas: Mild hypoattenuation in the pancreatic head is favored to be related to focal fat deposition. No duct dilatation or acute inflammation. Spleen: Normal  in size, without focal abnormality. Adrenals/Urinary Tract: Right adrenal thickening is unchanged. Left adrenal calcification and nodularity including up to 1.2 cm on 62/2, decreased from 1.5 cm on the prior. Too small to characterize bilateral renal lesions . In the absence of clinically indicated signs/symptoms require(s) no independent follow-up. No hydronephrosis. Normal urinary bladder. Stomach/Bowel: Normal remainder of the stomach. Normal colon, appendix, and terminal ileum. Normal small bowel. Vascular/Lymphatic: Aortic atherosclerosis. No abdominal retroperitoneal or pelvic sidewall adenopathy. Reproductive: Normal uterus and adnexa. Other: No significant free  fluid. Diffuse peritoneal and retroperitoneal nodularity is progressive. Example omental nodule of 11 mm on 59/2 measured 5 mm on the prior (when remeasured). Perinephric nodularity with a right-sided nodule measuring 8 mm on 77/2 versus 6 mm on the prior exam (when remeasured). A perirectal 9 mm nodule on 118/2 measured 5 mm on the prior exam (when remeasured). Musculoskeletal: There are no intramuscular metastasis, with an index 1.3 cm right paraspinal lesion on 85/2. Right proximal femur fixation. Similar diffuse sclerotic osseous metastasis. Index left iliac lesion of 5.2 cm on 102/2. IMPRESSION: CT CHEST IMPRESSION 1. Nodal progression within the low neck and chest, as detailed above. Most significant nodal enlargement is within the left axilla, right supraclavicular/low cervical and left hilar stations. 2. Evolution of presumed radiation change in the right paramediastinal and perihilar lung. Resolution of right lower lobe dense consolidation and surrounding tree-in-bud nodularity. 3. Similar tiny pulmonary nodules. 4. Similar diffuse osseous metastasis. 5. Developing subcutaneous metastasis. 6. Coronary artery atherosclerosis. Aortic Atherosclerosis (ICD10-I70.0). CT ABDOMEN AND PELVIS IMPRESSION 1. Moderate progression of peritoneal and retroperitoneal metastasis. Developing intramuscular metastasis. 2. No bowel obstruction or other acute complication. 3. Mild decrease in left adrenal metastasis. Electronically Signed   By: Jeronimo Greaves M.D.   On: 02/04/2023 08:31    Microbiology: Recent Results (from the past 240 hour(s))  Culture, blood (Routine x 2)     Status: None (Preliminary result)   Collection Time: 02/10/23 10:12 AM   Specimen: BLOOD LEFT ARM  Result Value Ref Range Status   Specimen Description BLOOD LEFT ARM  Final   Special Requests   Final    BOTTLES DRAWN AEROBIC AND ANAEROBIC Blood Culture adequate volume   Culture   Final    NO GROWTH < 24 HOURS Performed at [facility], [address] [place], [place] [address]    Report Status PENDING  Incomplete     Labs: Basic Metabolic Panel: Recent Labs  Lab 02/10/23 1040 02/11/23 0628  NA 131* 136  K 3.8 4.1  CL 98 105  CO2 20* 24  GLUCOSE 140* 128*  BUN 12 11  CREATININE 0.58 0.58  CALCIUM 8.7* 8.5*   Liver Function Tests: Recent Labs  Lab 02/10/23 1040  AST 47*  ALT 22  ALKPHOS 506*  BILITOT 1.0  PROT 7.4  ALBUMIN 3.3*   No results for input(s): "LIPASE", "AMYLASE" in the last 168 hours. No results for input(s): "AMMONIA" in the last 168 hours. CBC: Recent Labs  Lab 02/10/23 1040 02/11/23 0628  WBC 10.4 8.6  NEUTROABS 8.1*  --   HGB 11.6* 9.8*  HCT 36.9 31.8*  MCV 78.5* 79.1*  PLT 312 252   Cardiac Enzymes: No results for input(s): "CKTOTAL", "CKMB", "CKMBINDEX", "TROPONINI" in the last 168 hours. BNP: BNP (last 3 results) No results for input(s): "BNP" in the last 8760 hours.  ProBNP (last 3 results) No results for input(s): "PROBNP" in the last 8760 hours.  CBG: No results for  input(s): "GLUCAP" in the last 168 hours.     Signed:  Silvano Bilis MD.  [facility] 02/11/2023, 9:23 AM

## 2023-02-11 NOTE — ED Notes (Signed)
Pt is refusing MRI of her spine d/t pain being uncontrolled with current meds and anxiety. MD notified via secure chat and asked to speak with Pt

## 2023-02-14 LAB — CULTURE, BLOOD (ROUTINE X 2): Special Requests: ADEQUATE

## 2023-02-14 MED FILL — Fosaprepitant Dimeglumine For IV Infusion 150 MG (Base Eq): INTRAVENOUS | Qty: 5 | Status: AC

## 2023-02-14 MED FILL — Dexamethasone Sodium Phosphate Inj 100 MG/10ML: INTRAMUSCULAR | Qty: 1 | Status: AC

## 2023-02-15 ENCOUNTER — Inpatient Hospital Stay: Payer: 59

## 2023-02-15 ENCOUNTER — Other Ambulatory Visit: Payer: Self-pay

## 2023-02-15 ENCOUNTER — Inpatient Hospital Stay: Payer: 59 | Admitting: Oncology

## 2023-02-15 ENCOUNTER — Other Ambulatory Visit: Payer: Self-pay | Admitting: Oncology

## 2023-02-15 ENCOUNTER — Other Ambulatory Visit: Payer: Self-pay | Admitting: *Deleted

## 2023-02-15 DIAGNOSIS — C3491 Malignant neoplasm of unspecified part of right bronchus or lung: Secondary | ICD-10-CM

## 2023-02-15 LAB — CULTURE, BLOOD (ROUTINE X 2): Culture: NO GROWTH

## 2023-02-15 MED ORDER — HYDROMORPHONE HCL 2 MG PO TABS: 2.0000 mg | ORAL_TABLET | ORAL | 0 refills | Status: DC | PRN

## 2023-02-16 DIAGNOSIS — C419 Malignant neoplasm of bone and articular cartilage, unspecified: Secondary | ICD-10-CM | POA: Diagnosis not present

## 2023-02-16 DIAGNOSIS — C7800 Secondary malignant neoplasm of unspecified lung: Secondary | ICD-10-CM | POA: Diagnosis not present

## 2023-02-16 DIAGNOSIS — K5190 Ulcerative colitis, unspecified, without complications: Secondary | ICD-10-CM | POA: Diagnosis not present

## 2023-02-16 DIAGNOSIS — E2740 Unspecified adrenocortical insufficiency: Secondary | ICD-10-CM | POA: Diagnosis not present

## 2023-02-16 DIAGNOSIS — G63 Polyneuropathy in diseases classified elsewhere: Secondary | ICD-10-CM | POA: Diagnosis not present

## 2023-02-16 DIAGNOSIS — Z79891 Long term (current) use of opiate analgesic: Secondary | ICD-10-CM | POA: Diagnosis not present

## 2023-02-16 DIAGNOSIS — M545 Low back pain, unspecified: Secondary | ICD-10-CM | POA: Diagnosis not present

## 2023-02-16 DIAGNOSIS — Z7952 Long term (current) use of systemic steroids: Secondary | ICD-10-CM | POA: Diagnosis not present

## 2023-02-16 DIAGNOSIS — C719 Malignant neoplasm of brain, unspecified: Secondary | ICD-10-CM | POA: Diagnosis not present

## 2023-02-16 DIAGNOSIS — I10 Essential (primary) hypertension: Secondary | ICD-10-CM | POA: Diagnosis not present

## 2023-02-16 DIAGNOSIS — Z79620 Long term (current) use of immunosuppressive biologic: Secondary | ICD-10-CM | POA: Diagnosis not present

## 2023-02-16 DIAGNOSIS — Z87891 Personal history of nicotine dependence: Secondary | ICD-10-CM | POA: Diagnosis not present

## 2023-02-16 MED FILL — Fosaprepitant Dimeglumine For IV Infusion 150 MG (Base Eq): INTRAVENOUS | Qty: 5 | Status: AC

## 2023-02-16 MED FILL — Dexamethasone Sodium Phosphate Inj 100 MG/10ML: INTRAMUSCULAR | Qty: 1 | Status: AC

## 2023-02-17 ENCOUNTER — Encounter: Payer: Self-pay | Admitting: Oncology

## 2023-02-17 ENCOUNTER — Inpatient Hospital Stay (HOSPITAL_BASED_OUTPATIENT_CLINIC_OR_DEPARTMENT_OTHER): Payer: 59 | Admitting: Oncology

## 2023-02-17 ENCOUNTER — Inpatient Hospital Stay: Payer: 59

## 2023-02-17 ENCOUNTER — Other Ambulatory Visit: Payer: Self-pay | Admitting: *Deleted

## 2023-02-17 ENCOUNTER — Inpatient Hospital Stay: Payer: 59 | Attending: Oncology

## 2023-02-17 ENCOUNTER — Inpatient Hospital Stay (HOSPITAL_BASED_OUTPATIENT_CLINIC_OR_DEPARTMENT_OTHER): Payer: 59 | Admitting: Hospice and Palliative Medicine

## 2023-02-17 VITALS — BP 140/98 | HR 101 | Temp 96.6°F | Resp 16 | Wt 172.4 lb

## 2023-02-17 DIAGNOSIS — I10 Essential (primary) hypertension: Secondary | ICD-10-CM | POA: Insufficient documentation

## 2023-02-17 DIAGNOSIS — M4316 Spondylolisthesis, lumbar region: Secondary | ICD-10-CM | POA: Diagnosis not present

## 2023-02-17 DIAGNOSIS — G893 Neoplasm related pain (acute) (chronic): Secondary | ICD-10-CM

## 2023-02-17 DIAGNOSIS — C7970 Secondary malignant neoplasm of unspecified adrenal gland: Secondary | ICD-10-CM | POA: Diagnosis not present

## 2023-02-17 DIAGNOSIS — K449 Diaphragmatic hernia without obstruction or gangrene: Secondary | ICD-10-CM | POA: Diagnosis not present

## 2023-02-17 DIAGNOSIS — C7951 Secondary malignant neoplasm of bone: Secondary | ICD-10-CM | POA: Diagnosis not present

## 2023-02-17 DIAGNOSIS — I700 Atherosclerosis of aorta: Secondary | ICD-10-CM | POA: Diagnosis not present

## 2023-02-17 DIAGNOSIS — M47816 Spondylosis without myelopathy or radiculopathy, lumbar region: Secondary | ICD-10-CM | POA: Diagnosis not present

## 2023-02-17 DIAGNOSIS — R229 Localized swelling, mass and lump, unspecified: Secondary | ICD-10-CM | POA: Diagnosis not present

## 2023-02-17 DIAGNOSIS — K219 Gastro-esophageal reflux disease without esophagitis: Secondary | ICD-10-CM | POA: Diagnosis not present

## 2023-02-17 DIAGNOSIS — E2740 Unspecified adrenocortical insufficiency: Secondary | ICD-10-CM | POA: Diagnosis not present

## 2023-02-17 DIAGNOSIS — Z79899 Other long term (current) drug therapy: Secondary | ICD-10-CM | POA: Insufficient documentation

## 2023-02-17 DIAGNOSIS — I2510 Atherosclerotic heart disease of native coronary artery without angina pectoris: Secondary | ICD-10-CM | POA: Insufficient documentation

## 2023-02-17 DIAGNOSIS — Z5111 Encounter for antineoplastic chemotherapy: Secondary | ICD-10-CM

## 2023-02-17 DIAGNOSIS — Z5189 Encounter for other specified aftercare: Secondary | ICD-10-CM | POA: Diagnosis not present

## 2023-02-17 DIAGNOSIS — C778 Secondary and unspecified malignant neoplasm of lymph nodes of multiple regions: Secondary | ICD-10-CM | POA: Diagnosis not present

## 2023-02-17 DIAGNOSIS — C786 Secondary malignant neoplasm of retroperitoneum and peritoneum: Secondary | ICD-10-CM | POA: Insufficient documentation

## 2023-02-17 DIAGNOSIS — C3491 Malignant neoplasm of unspecified part of right bronchus or lung: Secondary | ICD-10-CM

## 2023-02-17 DIAGNOSIS — Z87891 Personal history of nicotine dependence: Secondary | ICD-10-CM | POA: Diagnosis not present

## 2023-02-17 DIAGNOSIS — Z9221 Personal history of antineoplastic chemotherapy: Secondary | ICD-10-CM | POA: Diagnosis not present

## 2023-02-17 DIAGNOSIS — Z5181 Encounter for therapeutic drug level monitoring: Secondary | ICD-10-CM

## 2023-02-17 DIAGNOSIS — Z7983 Long term (current) use of bisphosphonates: Secondary | ICD-10-CM | POA: Diagnosis not present

## 2023-02-17 LAB — CMP (CANCER CENTER ONLY)
ALT: 22 U/L (ref 0–44)
AST: 22 U/L (ref 15–41)
Albumin: 2.8 g/dL — ABNORMAL LOW (ref 3.5–5.0)
Alkaline Phosphatase: 246 U/L — ABNORMAL HIGH (ref 38–126)
Anion gap: 11 (ref 5–15)
BUN: 13 mg/dL (ref 6–20)
CO2: 24 mmol/L (ref 22–32)
Calcium: 8.7 mg/dL — ABNORMAL LOW (ref 8.9–10.3)
Chloride: 100 mmol/L (ref 98–111)
Creatinine: 0.49 mg/dL (ref 0.44–1.00)
GFR, Estimated: >60 mL/min (ref >60)
Glucose, Bld: 139 mg/dL — ABNORMAL HIGH (ref 70–99)
Potassium: 3.6 mmol/L (ref 3.5–5.1)
Sodium: 135 mmol/L (ref 135–145)
Total Bilirubin: 0.4 mg/dL (ref 0.3–1.2)
Total Protein: 6.9 g/dL (ref 6.5–8.1)

## 2023-02-17 LAB — CBC WITH DIFFERENTIAL (CANCER CENTER ONLY)
Abs Immature Granulocytes: 0.11 10*3/uL — ABNORMAL HIGH (ref 0.00–0.07)
Basophils Absolute: 0.1 10*3/uL (ref 0.0–0.1)
Basophils Relative: 1 %
Eosinophils Absolute: 0.5 10*3/uL (ref 0.0–0.5)
Eosinophils Relative: 6 %
HCT: 31.3 % — ABNORMAL LOW (ref 36.0–46.0)
Hemoglobin: 9.8 g/dL — ABNORMAL LOW (ref 12.0–15.0)
Immature Granulocytes: 1 %
Lymphocytes Relative: 8 %
Lymphs Abs: 0.7 10*3/uL (ref 0.7–4.0)
MCH: 24.6 pg — ABNORMAL LOW (ref 26.0–34.0)
MCHC: 31.3 g/dL (ref 30.0–36.0)
MCV: 78.4 fL — ABNORMAL LOW (ref 80.0–100.0)
Monocytes Absolute: 0.9 10*3/uL (ref 0.1–1.0)
Monocytes Relative: 11 %
Neutro Abs: 5.9 10*3/uL (ref 1.7–7.7)
Neutrophils Relative %: 73 %
Platelet Count: 356 10*3/uL (ref 150–400)
RBC: 3.99 MIL/uL (ref 3.87–5.11)
RDW: 18.6 % — ABNORMAL HIGH (ref 11.5–15.5)
WBC Count: 8.1 10*3/uL (ref 4.0–10.5)
nRBC: 0 % (ref 0.0–0.2)

## 2023-02-17 LAB — PROTEIN, URINE, RANDOM: Total Protein, Urine: 24 mg/dL

## 2023-02-17 MED ORDER — SODIUM CHLORIDE 0.9% FLUSH
10.0000 mL | INTRAVENOUS | Status: DC | PRN
  Administered 2023-02-17: 10 mL
  Filled 2023-02-17: qty 10

## 2023-02-17 MED ORDER — CITALOPRAM HYDROBROMIDE 10 MG PO TABS: 10.0000 mg | ORAL_TABLET | Freq: Every day | ORAL | 3 refills | Status: DC

## 2023-02-17 MED ORDER — PALONOSETRON HCL INJECTION 0.25 MG/5ML
0.2500 mg | Freq: Once | INTRAVENOUS | Status: AC
  Administered 2023-02-17: 0.25 mg via INTRAVENOUS
  Filled 2023-02-17: qty 5

## 2023-02-17 MED ORDER — SODIUM CHLORIDE 0.9 % IV SOLN
15.0000 mg/kg | Freq: Once | INTRAVENOUS | Status: AC
  Administered 2023-02-17: 1200 mg via INTRAVENOUS
  Filled 2023-02-17: qty 48

## 2023-02-17 MED ORDER — SODIUM CHLORIDE 0.9 % IV SOLN
10.0000 mg | Freq: Once | INTRAVENOUS | Status: AC
  Administered 2023-02-17: 10 mg via INTRAVENOUS
  Filled 2023-02-17: qty 10

## 2023-02-17 MED ORDER — DIPHENHYDRAMINE HCL 50 MG/ML IJ SOLN
50.0000 mg | Freq: Once | INTRAMUSCULAR | Status: AC
  Administered 2023-02-17: 50 mg via INTRAVENOUS
  Filled 2023-02-17: qty 1

## 2023-02-17 MED ORDER — SODIUM CHLORIDE 0.9 % IV SOLN
150.0000 mg | Freq: Once | INTRAVENOUS | Status: AC
  Administered 2023-02-17: 150 mg via INTRAVENOUS
  Filled 2023-02-17: qty 150

## 2023-02-17 MED ORDER — SODIUM CHLORIDE 0.9 % IV SOLN
175.0000 mg/m2 | Freq: Once | INTRAVENOUS | Status: AC
  Administered 2023-02-17: 336 mg via INTRAVENOUS
  Filled 2023-02-17: qty 56

## 2023-02-17 MED ORDER — HEPARIN SOD (PORK) LOCK FLUSH 100 UNIT/ML IV SOLN
500.0000 [IU] | Freq: Once | INTRAVENOUS | Status: AC | PRN
  Administered 2023-02-17: 500 [IU]
  Filled 2023-02-17: qty 5

## 2023-02-17 MED ORDER — ALPRAZOLAM 0.5 MG PO TABS: 0.5000 mg | ORAL_TABLET | Freq: Every day | ORAL | 0 refills | Status: DC | PRN

## 2023-02-17 MED ORDER — SODIUM CHLORIDE 0.9 % IV SOLN
621.5000 mg | Freq: Once | INTRAVENOUS | Status: AC
  Administered 2023-02-17: 620 mg via INTRAVENOUS
  Filled 2023-02-17: qty 62

## 2023-02-17 MED ORDER — FAMOTIDINE IN NACL 20-0.9 MG/50ML-% IV SOLN: 20.0000 mg | Freq: Once | INTRAVENOUS | Status: DC

## 2023-02-17 MED ORDER — ZOLEDRONIC ACID 4 MG/100ML IV SOLN
4.0000 mg | INTRAVENOUS | Status: DC
  Administered 2023-02-17: 4 mg via INTRAVENOUS
  Filled 2023-02-17: qty 100

## 2023-02-17 MED ORDER — SODIUM CHLORIDE 0.9 % IV SOLN
Freq: Once | INTRAVENOUS | Status: AC
  Filled 2023-02-17: qty 250

## 2023-02-17 MED ORDER — FAMOTIDINE 20 MG IN NS 100 ML IVPB
20.0000 mg | Freq: Once | INTRAVENOUS | Status: AC
  Administered 2023-02-17: 20 mg via INTRAVENOUS
  Filled 2023-02-17: qty 100

## 2023-02-17 MED ORDER — GABAPENTIN 300 MG PO CAPS: 300.0000 mg | ORAL_CAPSULE | Freq: Three times a day (TID) | ORAL | 3 refills | Status: DC

## 2023-02-17 NOTE — Progress Notes (Signed)
[facility] [phone]) [phone] [phone]  Patient Care Team: Gina Gang, FNP as PCP - General (Family Medicine) Gina Buff, RN as Oncology Nurse Navigator Gina Miller, MD as Radiation Oncologist (Radiation Oncology) Gina Rigger, MD as Consulting Physician (Pulmonary Disease)   NAME OF PATIENT: Gina Sanchez  [id number]  [birth year]   DATE OF VISIT: 02/17/23  REASON FOR CONSULT: Gina Sanchez is a 57 y.o. 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  .   INTERVAL HISTORY: Patient was an add-on to [facility] today for evaluation of right-sided burning pain.  She has had recent development of burning pain.  Denies urinary symptoms.  No known rashes.  She is on pain regimen of fentanyl/hydromorphone.   Denies lower extremity weakness.  Denies recent fevers or illnesses. Denies any easy bleeding or bruising. Reports fair appetite and denies weight loss. Denies chest pain. Denies any nausea, vomiting, constipation, or diarrhea. Denies urinary complaints. Patient offers no further specific complaints today.   PAST MEDICAL HISTORY: 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   Palpitations    Precordial chest pain    Wrist fracture 03/2021   left    PAST SURGICAL HISTORY:  Past Surgical History:  Procedure Laterality Date   BREAST BIOPSY Right 2017   benign   CERVICAL BIOPSY  W/ LOOP ELECTRODE EXCISION     COLONOSCOPY  06/11/2022   Procedure: COLONOSCOPY;  Surgeon: Gina Reil, MD;  Location: [facility] ENDOSCOPY;  Service: Gastroenterology;;   COLONOSCOPY WITH PROPOFOL  N/A 07/03/2015   Procedure: COLONOSCOPY WITH PROPOFOL;  Surgeon: Gina Cullens, MD;  Location: [facility] ENDOSCOPY;  Service: Gastroenterology;  Laterality: N/A;   COLONOSCOPY WITH PROPOFOL N/A 01/06/2022   Procedure: COLONOSCOPY WITH PROPOFOL;  Surgeon: Gina Reil, MD;  Location: [facility] ENDOSCOPY;  Service: Gastroenterology;  Laterality: N/A;   ESOPHAGOGASTRODUODENOSCOPY (EGD) WITH PROPOFOL N/A 07/03/2015   Procedure: ESOPHAGOGASTRODUODENOSCOPY (EGD) WITH PROPOFOL;  Surgeon: Gina Cullens, MD;  Location[facility] ENDOSCOPY;  Service: Gastroenterology;  Laterality: N/A;   FLEXIBLE SIGMOIDOSCOPY N/A 03/12/2022   Procedure: FLEXIBLE SIGMOIDOSCOPY;  Surgeon: Gina Reil, MD;  Location: [facility] ENDOSCOPY;  Service: Gastroenterology;  Laterality: N/A;   FLEXIBLE SIGMOIDOSCOPY N/A 08/17/2022   Procedure: FLEXIBLE SIGMOIDOSCOPY;  Surgeon: Gina Reil, MD;  Location: [facility] ENDOSCOPY;  Service: Gastroenterology;  Laterality: N/A;   FRACTURE SURGERY     INTRAMEDULLARY (IM) NAIL INTERTROCHANTERIC Right 09/15/2020   Procedure: INTRAMEDULLARY (IM) NAIL INTERTROCHANTRIC;  Surgeon: Gina Flake, MD;  Location: [facility] ORS;  Service: Orthopedics;  Laterality: Right;   IR CV LINE INJECTION  07/14/2022   IR IMAGING GUIDED PORT INSERTION  11/28/2020   LEFT HEART CATH AND CORONARY ANGIOGRAPHY N/A 03/28/2017   Procedure: Left Heart Cath and Coronary Angiography;  Surgeon: Gina Gess, MD;  Location: [facility] INVASIVE CV LAB;  Service: Cardiovascular;  Laterality: N/A;   ORIF WRIST FRACTURE Left 03/31/2021   Procedure: OPEN REDUCTION INTERNAL FIXATION (ORIF) LEFT DISTAL RADIUS FRACTURE.;  Surgeon: Gina Flake, MD;  Location: [facility] ORS;  Service: Orthopedics;  Laterality: Left;    HEMATOLOGY/ONCOLOGY  HISTORY:  Oncology History  Metastatic lung cancer (metastasis from lung to other site) [facility])  10/19/2020 Initial Diagnosis   Metastatic lung cancer (metastasis from lung to other site) [facility])   10/19/2020 Cancer Staging    Staging form: Lung, AJCC 8th Edition - Clinical: Stage IV (cT1, cN3, pM1) - Signed by Creig Hines, MD on 10/19/2020   10/27/2020 - 02/12/2022 Chemotherapy   Patient is on Treatment Plan : LUNG NSCLC Pemetrexed (Alimta) / Carboplatin q21d x 1 cycles     06/08/2022 - 09/15/2022 Chemotherapy   Patient is on Treatment Plan : LUNG NSCLC Pemetrexed + Carboplatin q21d x 4 Cycles     02/17/2023 -  Chemotherapy   Patient is on Treatment Plan : LUNG NSCLC Carboplatin + Paclitaxel + Bevacizumab q21d       ALLERGIES:  is allergic to factive [gemifloxacin].  MEDICATIONS:  Current Outpatient Medications  Medication Sig Dispense Refill   acetaminophen (TYLENOL) 500 MG tablet Take 500 mg by mouth every 6 (six) hours as needed. (Patient not taking: Reported on 02/17/2023)     ALPRAZolam (XANAX) 0.5 MG tablet TAKE ONE TABLET (0.5 MG) BY MOUTH EVERY DAY AS NEEDED (Patient taking differently: Take 0.5 mg by mouth daily as needed for anxiety. TAKE ONE TABLET (0.5 MG) BY MOUTH EVERY DAY AS NEEDED) 30 tablet 0   citalopram (CELEXA) 10 MG tablet Take 10 mg by mouth daily. (Patient not taking: Reported on 02/10/2023)     dicyclomine (BENTYL) 10 MG capsule Take 1 capsule (10 mg total) by mouth every 8 (eight) hours as needed for spasms. (Patient not taking: Reported on 02/17/2023) 30 capsule 0   fentaNYL (DURAGESIC) 25 MCG/HR Place 1 patch onto the skin every 3 (three) days. 10 patch 0   gabapentin (NEURONTIN) 300 MG capsule Take 1 capsule (300 mg total) by mouth 3 (three) times daily. 90 capsule 3   hydrocortisone (CORTEF) 10 MG tablet TAKE 1 TABLET BY MOUTH NIGHTLY 30 tablet 3   hydrocortisone (CORTEF) 20 MG tablet TAKE 1 TABLET BY MOUTH EVERY MORNING 30 tablet 3   HYDROmorphone (DILAUDID) 2 MG tablet Take 1-2 tablets (2-4 mg total) by mouth every 4 (four) hours as needed for severe pain. 120 tablet 0   lidocaine (LIDODERM) 5 % Place 1 patch onto the skin daily. Remove & Discard patch within 12 hours or as directed by MD 30  patch 0   metoprolol tartrate (LOPRESSOR) 25 MG tablet Take 25 mg by mouth in the morning.     nitrofurantoin, macrocrystal-monohydrate, (MACROBID) 100 MG capsule Take 100 mg by mouth 2 (two) times daily.     ondansetron (ZOFRAN) 4 MG tablet TAKE 1 TABLET BY MOUTH EVERY 8 HOURS AS NEEDED FOR NAUSEA AND VOMITING (Patient taking differently: Take 4 mg by mouth every 8 (eight) hours as needed for nausea or vomiting. TAKE 1 TABLET BY MOUTH EVERY 8 HOURS AS NEEDED FOR NAUSEA AND VOMITING) 20 tablet 1   prochlorperazine (COMPAZINE) 10 MG tablet Take 1 tablet (10 mg total) by mouth every 6 (six) hours as needed for nausea or vomiting. 30 tablet 1   valACYclovir (VALTREX) 1000 MG tablet Take 1 tablet (1,000 mg total) by mouth 3 (three) times daily. (Patient not taking: Reported on 02/10/2023) 30 tablet 0   Vedolizumab (ENTYVIO IV) Inject 1 Dose into the vein as directed. Every 4 weeks at her home     No current facility-administered medications for this visit.   Facility-Administered Medications Ordered in Other Visits  Medication Dose Route Frequency Provider Last Rate Last Admin   CARBOplatin (PARAPLATIN) 620 mg in sodium chloride 0.9 % 250 mL chemo infusion  620 mg Intravenous Once Creig Hines, MD       dexamethasone (DECADRON) 10 mg in sodium chloride 0.9 % 50 mL IVPB  10 mg Intravenous Once Creig Hines, MD       fosaprepitant (EMEND) 150 mg in sodium chloride 0.9 % 145 mL IVPB  150 mg Intravenous Once Creig Hines, MD       heparin lock flush 100 unit/mL  500 Units Intracatheter Once PRN Creig Hines, MD       PACLitaxel (TAXOL) 336 mg in sodium chloride 0.9 % 500 mL chemo infusion (> 80mg /m2)  175 mg/m2 (Treatment Plan Recorded) Intravenous Once Creig Hines, MD       sodium chloride flush (NS) 0.9 % injection 10 mL  10 mL Intravenous PRN Creig Hines, MD   10 mL at 03/09/21 0906   sodium chloride flush (NS) 0.9 % injection 10 mL  10 mL Intracatheter PRN Creig Hines, MD        Zoledronic Acid (ZOMETA) IVPB 4 mg  4 mg Intravenous Q28 days Creig Hines, MD        VITAL SIGNS: LMP 09/15/2018  There were no vitals filed for this visit.  Estimated body mass index is 27.83 kg/m as calculated from the following:   Height as of 02/10/23: 5\' 6"  (1.676 m).   Weight as of an earlier encounter on 02/17/23: 172 lb 6.4 oz (78.2 kg).  LABS: CBC:    Component Value Date/Time   WBC 8.1 02/17/2023 0915   WBC 8.6 02/11/2023 0628   HGB 9.8 (L) 02/17/2023 0915   HGB 13.0 05/31/2022 1156   HCT 31.3 (L) 02/17/2023 0915   HCT 40.9 05/31/2022 1156   PLT 356 02/17/2023 0915   PLT 331 05/31/2022 1156   MCV 78.4 (L) 02/17/2023 0915   MCV 87 05/31/2022 1156   NEUTROABS 5.9 02/17/2023 0915   NEUTROABS 6.5 03/14/2018 1343   LYMPHSABS 0.7 02/17/2023 0915   LYMPHSABS 4.1 (H) 03/14/2018 1343   MONOABS 0.9 02/17/2023 0915   EOSABS 0.5 02/17/2023 0915   EOSABS 0.4 03/14/2018 1343   BASOSABS 0.1 02/17/2023 0915   BASOSABS 0.0 03/14/2018 1343   Comprehensive Metabolic Panel:    Component Value Date/Time   NA 135 02/17/2023 0915   NA 141 05/31/2022 1156   K 3.6 02/17/2023 0915   CL 100 02/17/2023 0915   CO2 24 02/17/2023 0915   BUN 13 02/17/2023 0915   BUN 8 05/31/2022 1156   CREATININE 0.49 02/17/2023 0915   GLUCOSE 139 (H) 02/17/2023 0915   CALCIUM 8.7 (L) 02/17/2023 0915   AST 22 02/17/2023 0915   ALT 22 02/17/2023 0915   ALKPHOS 246 (H) 02/17/2023 0915   BILITOT 0.4 02/17/2023 0915   PROT 6.9 02/17/2023 0915   PROT 5.9 (L) 05/31/2022 1156   ALBUMIN 2.8 (L) 02/17/2023 0915   ALBUMIN 3.4 (L) 05/31/2022 1156    RADIOGRAPHIC STUDIES: NM Bone Scan Whole Body  Result Date: 02/14/2023 CLINICAL DATA:  Restaging metastatic lung cancer. EXAM: NUCLEAR MEDICINE WHOLE BODY BONE SCAN TECHNIQUE: Whole body anterior and posterior images were obtained approximately 3 hours after intravenous injection of radiopharmaceutical. RADIOPHARMACEUTICALS:  20.21 mCi Technetium-59m MDP IV  COMPARISON:  12/08/2022 FINDINGS: Again seen is multifocal abnormal areas of increased uptake within the axial and proximal appendicular skeleton and  calvarium compatible with widespread osseous metastasis. Lesions are noted within the calvarium, sternum, bilateral ribs, bilateral scapula, thoracic and lumbar spine, bilateral humeri, bilateral femoral, and pelvis. Compared with the previous exam there is been progressive uptake at the approximate T6 and T7 vertebra. Additionally, there is been mild increase in size of the left frontal bone. Normal physiologic tracer activity is identified within the kidneys an urinary bladder. IMPRESSION: 1. Interval increase in tracer uptake associated with previous lesions within the left frontal bone and approximate T6 and T7 vertebra. 2. The remaining lesions appear similar in size and multiplicity. Electronically Signed   By: Signa Kell M.D.   On: 02/14/2023 09:19   DG Lumbar Spine 2-3 Views  Result Date: 02/10/2023 CLINICAL DATA:  Back pain history of cancer EXAM: LUMBAR SPINE - 2-3 VIEW COMPARISON:  CT 02/03/2023 FINDINGS: Grade 1 anterolisthesis L3 on L4 and trace anterolisthesis L2 on L3. Sclerosis within multiple vertebral bodies as well as the pelvis consistent with metastatic disease. Vertebral body heights are maintained. Mild disc space narrowing and degenerative change at L4-L5 and L5-S1. Facet degenerative changes of the mid to lower lumbar spine. IMPRESSION: 1. Mild degenerative changes of the lumbar spine with grade 1 anterolisthesis L3 on L4 and trace anterolisthesis L2 on L3. 2. Sclerosis within multiple vertebral bodies and pelvis consistent with metastatic disease. Electronically Signed   By: Jasmine Pang M.D.   On: 02/10/2023 22:06   CT Angio Chest PE W and/or Wo Contrast  Result Date: 02/10/2023 CLINICAL DATA:  Pulmonary embolism (PE) suspected, high prob EXAM: CT ANGIOGRAPHY CHEST WITH CONTRAST TECHNIQUE: Multidetector CT imaging of the chest  was performed using the standard protocol during bolus administration of intravenous contrast. Multiplanar CT image reconstructions and MIPs were obtained to evaluate the vascular anatomy. RADIATION DOSE REDUCTION: This exam was performed according to the departmental dose-optimization program which includes automated exposure control, adjustment of the mA and/or kV according to patient size and/or use of iterative reconstruction technique. CONTRAST:  75mL OMNIPAQUE IOHEXOL 350 MG/ML SOLN COMPARISON:  Chest XR, 07/01/2020.  CT CAP, 02/03/2023. FINDINGS: Cardiovascular: Satisfactory opacification of the pulmonary arteries to the segmental level. No segmental or larger pulmonary embolus. Normal heart size. No pericardial effusion. Mediastinum/Nodes: Similar degree of RIGHT paratracheal and BILATERAL hilar adenopathy, better appreciated and described on recent comparison CT CAP. Enlarged LEFT axillary lymph nodes. RIGHT jugular-approach chest port with catheter tip well-positioned at the superior cavoatrial junction. Thyroid gland, trachea, and esophagus demonstrate no significant findings. Lungs/Pleura: RIGHT perihilar and peribronchial thickening, unchanged. Mild linear bibasilar atelectasis. No new consolidation, mass or suspicious pulmonary nodule. No pleural effusion or pneumothorax. Upper Abdomen: No acute abnormality. Small 1.4 cm hypodensity of the hepatic dome, incompletely assessed. Musculoskeletal: New fat stranding about the RIGHT thoracic inlet and axilla, incompletely assessed. Multifocal osseous metastases involving the ribs and imaged thoracic spine. Review of the MIP images confirms the above findings. IMPRESSION: 1. No segmental or larger pulmonary embolus. No acute intrathoracic abnormality. 2. New fat stranding about the RIGHT thoracic inlet and axilla, incompletely assessed. Clinical correlation is recommended. 3. Similar malignant findings within chest, and including multifocal osseous metastases.  Electronically Signed   By: Roanna Banning M.D.   On: 02/10/2023 16:58   CT CHEST ABDOMEN PELVIS W CONTRAST  Result Date: 02/04/2023 CLINICAL DATA:  Right lung cancer, adenocarcinoma. Development of skin nodules. Nodules of right shoulder and left axilla. * Tracking Code: BO * EXAM: CT CHEST, ABDOMEN, AND PELVIS WITH CONTRAST TECHNIQUE: Multidetector CT  imaging of the chest, abdomen and pelvis was performed following the standard protocol during bolus administration of intravenous contrast. RADIATION DOSE REDUCTION: This exam was performed according to the departmental dose-optimization program which includes automated exposure control, adjustment of the mA and/or kV according to patient size and/or use of iterative reconstruction technique. CONTRAST:  OMNIPAQUE IOHEXOL 300 MG/ML  SOLN COMPARISON:  12/15/2022 FINDINGS: CT CHEST FINDINGS Cardiovascular: Right Port-A-Cath tip high right atrium. Aortic atherosclerosis. Tortuous thoracic aorta. Normal heart size, without pericardial effusion. Multivessel coronary artery atherosclerosis. No central pulmonary embolism, on this non-dedicated study. Mediastinum/Nodes: Right supraclavicular node measures 7 mm on 06/02 and is enlarged from 4-5 mm on the prior. Posterior left axillary node measures 1.5 cm on 10/02 versus 8 mm on the prior (when remeasured). Right paratracheal ill-defined soft tissue density including on 17/2 is similar, difficult to quantify secondary to ill definition of soft tissue planes in this region. Left hilar node measures 1.2 cm in 25/2 and is newly enlarged. Tiny hiatal hernia. Nodularity in the epicardial fat including at up to 7 mm on 48/2, increased from 1-2 mm on the prior. Lungs/Pleura: No pleural fluid. Increase in right perihilar and paramediastinal consolidation and volume loss. The right lower lobe area of dense consolidation medially has improved and the right lower lobe airspace and ground-glass opacities have resolved. A 2 mm left  upper lobe pulmonary nodule on 46/4 is unchanged. The posterior left upper lobe pulmonary nodule detailed on the prior exam measures 4 mm on 55/4 today versus 5 mm on the prior. Musculoskeletal: Anterior right chest wall subcutaneous nodule of 8 mm on 19/2 measured 4 mm on the prior. Diffuse sclerotic osseous metastasis are similar. CT ABDOMEN PELVIS FINDINGS Hepatobiliary: Focal steatosis adjacent the falciform ligament. Normal gallbladder, without biliary ductal dilatation. Pancreas: Mild hypoattenuation in the pancreatic head is favored to be related to focal fat deposition. No duct dilatation or acute inflammation. Spleen: Normal in size, without focal abnormality. Adrenals/Urinary Tract: Right adrenal thickening is unchanged. Left adrenal calcification and nodularity including up to 1.2 cm on 62/2, decreased from 1.5 cm on the prior. Too small to characterize bilateral renal lesions . In the absence of clinically indicated signs/symptoms require(s) no independent follow-up. No hydronephrosis. Normal urinary bladder. Stomach/Bowel: Normal remainder of the stomach. Normal colon, appendix, and terminal ileum. Normal small bowel. Vascular/Lymphatic: Aortic atherosclerosis. No abdominal retroperitoneal or pelvic sidewall adenopathy. Reproductive: Normal uterus and adnexa. Other: No significant free fluid. Diffuse peritoneal and retroperitoneal nodularity is progressive. Example omental nodule of 11 mm on 59/2 measured 5 mm on the prior (when remeasured). Perinephric nodularity with a right-sided nodule measuring 8 mm on 77/2 versus 6 mm on the prior exam (when remeasured). A perirectal 9 mm nodule on 118/2 measured 5 mm on the prior exam (when remeasured). Musculoskeletal: There are no intramuscular metastasis, with an index 1.3 cm right paraspinal lesion on 85/2. Right proximal femur fixation. Similar diffuse sclerotic osseous metastasis. Index left iliac lesion of 5.2 cm on 102/2. IMPRESSION: CT CHEST IMPRESSION  1. Nodal progression within the low neck and chest, as detailed above. Most significant nodal enlargement is within the left axilla, right supraclavicular/low cervical and left hilar stations. 2. Evolution of presumed radiation change in the right paramediastinal and perihilar lung. Resolution of right lower lobe dense consolidation and surrounding tree-in-bud nodularity. 3. Similar tiny pulmonary nodules. 4. Similar diffuse osseous metastasis. 5. Developing subcutaneous metastasis. 6. Coronary artery atherosclerosis. Aortic Atherosclerosis (ICD10-I70.0). CT ABDOMEN AND PELVIS IMPRESSION 1. Moderate  progression of peritoneal and retroperitoneal metastasis. Developing intramuscular metastasis. 2. No bowel obstruction or other acute complication. 3. Mild decrease in left adrenal metastasis. Electronically Signed   By: Jeronimo Greaves M.D.   On: 02/04/2023 08:31    PERFORMANCE STATUS (ECOG) : 1 - Symptomatic but completely ambulatory  Review of Systems Unless otherwise noted, a complete review of systems is negative.  Physical Exam General: NAD Pulmonary: clear ant fields Extremities: no edema, no joint deformities Skin: no rashes Neurological: Weakness but otherwise nonfocal  IMPRESSION/PLAN: Right flank burning pain -no rashes noted.  No urinary symptoms.  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  Patient dose of gabapentin was increased to 300 mg 3 times daily by Dr. Smith Robert today.  Will see if this improves symptoms.  Further dose titration versus other neuropathic pain medications can be considered if pain remains problematic.  Continue fentanyl/hydromorphone.   Patient expressed understanding and was in agreement with this plan. She also understands that She can call clinic at any time with any questions, concerns, or complaints.   Thank you for allowing me to participate in the care of this very pleasant patient.   Time Total: 15 minutes  Visit  consisted of counseling and education dealing with the complex and emotionally intense issues of symptom management in the setting of serious illness.Greater than 50%  of this time was spent counseling and coordinating care related to the above assessment and plan.  Signed by: Laurette Schimke, PhD, NP-C

## 2023-02-17 NOTE — Patient Instructions (Addendum)
[facility]  Discharge Instructions: Thank you for choosing [facility] to provide your oncology and hematology care.  If you have a lab appointment with the Cancer Center, please go directly to the Cancer Center and check in at the registration area.  Wear comfortable clothing and clothing appropriate for easy access to any Portacath or PICC line.   We strive to give you quality time with your provider. You may need to reschedule your appointment if you arrive late (15 or more minutes).  Arriving late affects you and other patients whose appointments are after yours.  Also, if you miss three or more appointments without notifying the office, you may be dismissed from the clinic at the provider's discretion.      For prescription refill requests, have your pharmacy contact our office and allow 72 hours for refills to be completed.    Today you received the following chemotherapy and/or immunotherapy agents- Taxol, Carboplatin, MVASI      To help prevent nausea and vomiting after your treatment, we encourage you to take your nausea medication as directed.  BELOW ARE SYMPTOMS THAT SHOULD BE REPORTED IMMEDIATELY: *FEVER GREATER THAN 100.4 F (38 C) OR HIGHER *CHILLS OR SWEATING *NAUSEA AND VOMITING THAT IS NOT CONTROLLED WITH YOUR NAUSEA MEDICATION *UNUSUAL SHORTNESS OF BREATH *UNUSUAL BRUISING OR BLEEDING *URINARY PROBLEMS (pain or burning when urinating, or frequent urination) *BOWEL PROBLEMS (unusual diarrhea, constipation, pain near the anus) TENDERNESS IN MOUTH AND THROAT WITH OR WITHOUT PRESENCE OF ULCERS (sore throat, sores in mouth, or a toothache) UNUSUAL RASH, SWELLING OR PAIN  UNUSUAL VAGINAL DISCHARGE OR ITCHING   Items with * indicate a potential emergency and should be followed up as soon as possible or go to the Emergency Department if any problems should occur.  Please show the CHEMOTHERAPY ALERT CARD or IMMUNOTHERAPY ALERT  CARD at check-in to the Emergency Department and triage nurse.  Should you have questions after your visit or need to cancel or reschedule your appointment, please contact [facility]  [phone] and follow the prompts.  Office hours are 8:00 a.m. to 4:30 p.m. Monday - Friday. Please note that voicemails left after 4:00 p.m. may not be returned until the following business day.  We are closed weekends and major holidays. You have access to a nurse at all times for urgent questions. Please call the main number to the clinic [phone] and follow the prompts.  For any non-urgent questions, you may also contact your provider using MyChart. We now offer e-Visits for anyone 80 and older to request care online for non-urgent symptoms. For details visit [email or web address].   Also download the MyChart app! Go to the app store, search "MyChart", open the app, select [facility], and log in with your MyChart username and password.   Paclitaxel Injection What is this medication? PACLITAXEL (PAK li TAX el) treats some types of cancer. It works by slowing down the growth of cancer cells. This medicine may be used for other purposes; ask your health care provider or pharmacist if you have questions. COMMON BRAND NAME(S): Onxol, Taxol What should I tell my care team before I take this medication? They need to know if you have any of these conditions: Heart disease Liver disease Low white blood cell levels An unusual or allergic reaction to paclitaxel, other medications, foods, dyes, or preservatives If you or your partner are pregnant or trying to get pregnant Breast-feeding How should I  use this medication? This medication is injected into a vein. It is given by your care team in a hospital or clinic setting. Talk to your care team about the use of this medication in children. While it may be given to children for selected conditions, precautions do  apply. Overdosage: If you think you have taken too much of this medicine contact a poison control center or emergency room at once. NOTE: This medicine is only for you. Do not share this medicine with others. What if I miss a dose? Keep appointments for follow-up doses. It is important not to miss your dose. Call your care team if you are unable to keep an appointment. What may interact with this medication? Do not take this medication with any of the following: Live virus vaccines Other medications may affect the way this medication works. Talk with your care team about all of the medications you take. They may suggest changes to your treatment plan to lower the risk of side effects and to make sure your medications work as intended. This list may not describe all possible interactions. Give your health care provider a list of all the medicines, herbs, non-prescription drugs, or dietary supplements you use. Also tell them if you smoke, drink alcohol, or use illegal drugs. Some items may interact with your medicine. What should I watch for while using this medication? Your condition will be monitored carefully while you are receiving this medication. You may need blood work while taking this medication. This medication may make you feel generally unwell. This is not uncommon as chemotherapy can affect healthy cells as well as cancer cells. Report any side effects. Continue your course of treatment even though you feel ill unless your care team tells you to stop. This medication can cause serious allergic reactions. To reduce the risk, your care team may give you other medications to take before receiving this one. Be sure to follow the directions from your care team. This medication may increase your risk of getting an infection. Call your care team for advice if you get a fever, chills, sore throat, or other symptoms of a cold or flu. Do not treat yourself. Try to avoid being around people who are  sick. This medication may increase your risk to bruise or bleed. Call your care team if you notice any unusual bleeding. Be careful brushing or flossing your teeth or using a toothpick because you may get an infection or bleed more easily. If you have any dental work done, tell your dentist you are receiving this medication. Talk to your care team if you may be pregnant. Serious birth defects can occur if you take this medication during pregnancy. Talk to your care team before breastfeeding. Changes to your treatment plan may be needed. What side effects may I notice from receiving this medication? Side effects that you should report to your care team as soon as possible: Allergic reactions--skin rash, itching, hives, swelling of the face, lips, tongue, or throat Heart rhythm changes--fast or irregular heartbeat, dizziness, feeling faint or lightheaded, chest pain, trouble breathing Increase in blood pressure Infection--fever, chills, cough, sore throat, wounds that don't heal, pain or trouble when passing urine, general feeling of discomfort or being unwell Low blood pressure--dizziness, feeling faint or lightheaded, blurry vision Low red blood cell level--unusual weakness or fatigue, dizziness, headache, trouble breathing Painful swelling, warmth, or redness of the skin, blisters or sores at the infusion site Pain, tingling, or numbness in the hands or feet Slow  heartbeat--dizziness, feeling faint or lightheaded, confusion, trouble breathing, unusual weakness or fatigue Unusual bruising or bleeding Side effects that usually do not require medical attention (report to your care team if they continue or are bothersome): Diarrhea Hair loss Joint pain Loss of appetite Muscle pain Nausea Vomiting This list may not describe all possible side effects. Call your doctor for medical advice about side effects. You may report side effects to FDA at 1-800-FDA-1088. Where should I keep my  medication? This medication is given in a hospital or clinic. It will not be stored at home. NOTE: This sheet is a summary. It may not cover all possible information. If you have questions about this medicine, talk to your doctor, pharmacist, or health care provider.  2024 Elsevier/Gold Standard (2022-01-19 00:00:00)  Bevacizumab Injection What is this medication? BEVACIZUMAB (be va SIZ yoo mab) treats some types of cancer. It works by blocking a protein that causes cancer cells to grow and multiply. This helps to slow or stop the spread of cancer cells. It is a monoclonal antibody. This medicine may be used for other purposes; ask your health care provider or pharmacist if you have questions. COMMON BRAND NAME(S): Alymsys, Avastin, MVASI, Omer Jack What should I tell my care team before I take this medication? They need to know if you have any of these conditions: Blood clots Coughing up blood Having or recent surgery Heart failure High blood pressure History of a connection between 2 or more body parts that do not usually connect (fistula) History of a tear in your stomach or intestines Protein in your urine An unusual or allergic reaction to bevacizumab, other medications, foods, dyes, or preservatives Pregnant or trying to get pregnant Breast-feeding How should I use this medication? This medication is injected into a vein. It is given by your care team in a hospital or clinic setting. Talk to your care team the use of this medication in children. Special care may be needed. Overdosage: If you think you have taken too much of this medicine contact a poison control center or emergency room at once. NOTE: This medicine is only for you. Do not share this medicine with others. What if I miss a dose? Keep appointments for follow-up doses. It is important not to miss your dose. Call your care team if you are unable to keep an appointment. What may interact with this  medication? Interactions are not expected. This list may not describe all possible interactions. Give your health care provider a list of all the medicines, herbs, non-prescription drugs, or dietary supplements you use. Also tell them if you smoke, drink alcohol, or use illegal drugs. Some items may interact with your medicine. What should I watch for while using this medication? Your condition will be monitored carefully while you are receiving this medication. You may need blood work while taking this medication. This medication may make you feel generally unwell. This is not uncommon as chemotherapy can affect healthy cells as well as cancer cells. Report any side effects. Continue your course of treatment even though you feel ill unless your care team tells you to stop. This medication may increase your risk to bruise or bleed. Call your care team if you notice any unusual bleeding. Before having surgery, talk to your care team to make sure it is ok. This medication can increase the risk of poor healing of your surgical site or wound. You will need to stop this medication for 28 days before surgery. After surgery, wait at  least 28 days before restarting this medication. Make sure the surgical site or wound is healed enough before restarting this medication. Talk to your care team if questions. Talk to your care team if you may be pregnant. Serious birth defects can occur if you take this medication during pregnancy and for 6 months after the last dose. Contraception is recommended while taking this medication and for 6 months after the last dose. Your care team can help you find the option that works for you. Do not breastfeed while taking this medication and for 6 months after the last dose. This medication can cause infertility. Talk to your care team if you are concerned about your fertility. What side effects may I notice from receiving this medication? Side effects that you should report to your  care team as soon as possible: Allergic reactions--skin rash, itching, hives, swelling of the face, lips, tongue, or throat Bleeding--bloody or black, tar-like stools, vomiting blood or brown material that looks like coffee grounds, red or dark brown urine, small red or purple spots on skin, unusual bruising or bleeding Blood clot--pain, swelling, or warmth in the leg, shortness of breath, chest pain Heart attack--pain or tightness in the chest, shoulders, arms, or jaw, nausea, shortness of breath, cold or clammy skin, feeling faint or lightheaded Heart failure--shortness of breath, swelling of the ankles, feet, or hands, sudden weight gain, unusual weakness or fatigue Increase in blood pressure Infection--fever, chills, cough, sore throat, wounds that don't heal, pain or trouble when passing urine, general feeling of discomfort or being unwell Infusion reactions--chest pain, shortness of breath or trouble breathing, feeling faint or lightheaded Kidney injury--decrease in the amount of urine, swelling of the ankles, hands, or feet Stomach pain that is severe, does not go away, or gets worse Stroke--sudden numbness or weakness of the face, arm, or leg, trouble speaking, confusion, trouble walking, loss of balance or coordination, dizziness, severe headache, change in vision Sudden and severe headache, confusion, change in vision, seizures, which may be signs of posterior reversible encephalopathy syndrome (PRES) Side effects that usually do not require medical attention (report to your care team if they continue or are bothersome): Back pain Change in taste Diarrhea Dry skin Increased tears Nosebleed This list may not describe all possible side effects. Call your doctor for medical advice about side effects. You may report side effects to FDA at 1-800-FDA-1088. Where should I keep my medication? This medication is given in a hospital or clinic. It will not be stored at home. NOTE: This sheet is  a summary. It may not cover all possible information. If you have questions about this medicine, talk to your doctor, pharmacist, or health care provider.  2024 Elsevier/Gold Standard (2022-01-15 00:00:00)

## 2023-02-18 ENCOUNTER — Encounter: Payer: Self-pay | Admitting: Oncology

## 2023-02-18 ENCOUNTER — Telehealth: Payer: Self-pay | Admitting: *Deleted

## 2023-02-18 ENCOUNTER — Inpatient Hospital Stay: Payer: 59

## 2023-02-18 DIAGNOSIS — K449 Diaphragmatic hernia without obstruction or gangrene: Secondary | ICD-10-CM | POA: Diagnosis not present

## 2023-02-18 DIAGNOSIS — M47816 Spondylosis without myelopathy or radiculopathy, lumbar region: Secondary | ICD-10-CM | POA: Diagnosis not present

## 2023-02-18 DIAGNOSIS — Z9221 Personal history of antineoplastic chemotherapy: Secondary | ICD-10-CM | POA: Diagnosis not present

## 2023-02-18 DIAGNOSIS — Z87891 Personal history of nicotine dependence: Secondary | ICD-10-CM | POA: Diagnosis not present

## 2023-02-18 DIAGNOSIS — C7951 Secondary malignant neoplasm of bone: Secondary | ICD-10-CM | POA: Diagnosis not present

## 2023-02-18 DIAGNOSIS — C3491 Malignant neoplasm of unspecified part of right bronchus or lung: Secondary | ICD-10-CM

## 2023-02-18 DIAGNOSIS — C778 Secondary and unspecified malignant neoplasm of lymph nodes of multiple regions: Secondary | ICD-10-CM | POA: Diagnosis not present

## 2023-02-18 DIAGNOSIS — E2740 Unspecified adrenocortical insufficiency: Secondary | ICD-10-CM | POA: Diagnosis not present

## 2023-02-18 DIAGNOSIS — I10 Essential (primary) hypertension: Secondary | ICD-10-CM | POA: Diagnosis not present

## 2023-02-18 DIAGNOSIS — Z5189 Encounter for other specified aftercare: Secondary | ICD-10-CM | POA: Diagnosis not present

## 2023-02-18 DIAGNOSIS — K219 Gastro-esophageal reflux disease without esophagitis: Secondary | ICD-10-CM | POA: Diagnosis not present

## 2023-02-18 DIAGNOSIS — I700 Atherosclerosis of aorta: Secondary | ICD-10-CM | POA: Diagnosis not present

## 2023-02-18 DIAGNOSIS — Z5111 Encounter for antineoplastic chemotherapy: Secondary | ICD-10-CM | POA: Diagnosis not present

## 2023-02-18 DIAGNOSIS — C7970 Secondary malignant neoplasm of unspecified adrenal gland: Secondary | ICD-10-CM | POA: Diagnosis not present

## 2023-02-18 DIAGNOSIS — G893 Neoplasm related pain (acute) (chronic): Secondary | ICD-10-CM | POA: Diagnosis not present

## 2023-02-18 DIAGNOSIS — C786 Secondary malignant neoplasm of retroperitoneum and peritoneum: Secondary | ICD-10-CM | POA: Diagnosis not present

## 2023-02-18 DIAGNOSIS — I2510 Atherosclerotic heart disease of native coronary artery without angina pectoris: Secondary | ICD-10-CM | POA: Diagnosis not present

## 2023-02-18 DIAGNOSIS — Z79899 Other long term (current) drug therapy: Secondary | ICD-10-CM | POA: Diagnosis not present

## 2023-02-18 DIAGNOSIS — M4316 Spondylolisthesis, lumbar region: Secondary | ICD-10-CM | POA: Diagnosis not present

## 2023-02-18 DIAGNOSIS — R229 Localized swelling, mass and lump, unspecified: Secondary | ICD-10-CM | POA: Diagnosis not present

## 2023-02-18 MED ORDER — PEGFILGRASTIM-JMDB 6 MG/0.6ML ~~LOC~~ SOSY
6.0000 mg | PREFILLED_SYRINGE | Freq: Once | SUBCUTANEOUS | Status: AC
  Administered 2023-02-18: 6 mg via SUBCUTANEOUS

## 2023-02-18 NOTE — Telephone Encounter (Signed)
1600-I spoke with husband- he is not w/pt. daughter is w/pt. he didn't know about the apt. he is not sure if his wife did or not. he will call his daughter to see if pt can get here by 430. I don't mind staying and neither does Verlon Au. but they live in [place]. so I'm not sure they can even get here in time. husband said he would call me back in a moment   1602- husband just called back. pt is about 15 mins away. Daughter is bringing her in now.

## 2023-02-18 NOTE — Telephone Encounter (Signed)
Attempted to reach patient. No answer. Left vm. Patient has not shown up yet for her Fulphila injection as of 1547. Pt evidently didn't finish chemotherapy until later yesterday afternoon. Checking with charge team to see if pt was instructed to come later. Front desk notified that pt has not yet arrived.

## 2023-02-20 ENCOUNTER — Encounter: Payer: Self-pay | Admitting: Oncology

## 2023-02-20 NOTE — Progress Notes (Signed)
I connected with Gina Sanchez on 02/20/23 at  9:15 AM EDT by video enabled telemedicine visit and verified that I am speaking with the correct person using two identifiers.   I discussed the limitations, risks, security and privacy concerns of performing an evaluation and management service by telemedicine and the availability of in-person appointments. I also discussed with the patient that there may be a patient responsible charge related to this service. The patient expressed understanding and agreed to proceed.  Other persons participating in the visit and their role in the encounter:  patients husband  Patient's location:  cancer center Provider's location:  home  Chief Complaint: On treatment assessment prior to cycle 1 of carbo Taxol and Avastin chemotherapy  History of present illness: Patient is a 57 year old female with a past medical history significant for hypertension hyperlipidemia and anxiety who presented with right thigh pain and was found to have an acute right proximal femoral shaft fracture.  She underwent operative fixation on 10/12/2020.  MRI of femur showed heterogeneously enhancing osseous lesion within the area which would be nonspecific versus office neoplasm or metastatic lesion.  CT chest abdomen and pelvis with contrast showed an enlarged pretracheal lymph node 2.2 x 1.5 cm and a right paratracheal lymph node measuring 2.7 x 1.3 cm.  Prevascular node measuring 0.3 x 0.9 cm.  5 x 4 mm right middle lobe nodule.  2.8 x 1.9 cm left adrenal lesion.    Reamings from the right femur showed metastatic poorly differentiated carcinoma.  Immunohistochemistry showed was positive for pancytokeratin, CK7 and patchy CK20 with patchy dim expression of TTF-1.  Cells negative for Melan-A, CDX2, PAX8, Napsin A, GATA3, p40, CD56, p16 and thyroglobulin.  Findings compatible with metastatic carcinoma but because of decalcification immunohistochemical staining is unreliable.  Patchy dim staining  with TTF-1 suspicious for lung primary but not a definitive diagnosis.   Repeat supraclavicular lymph node biopsy showed metastatic adenocarcinoma.  Tumor cells positive for CK7 with focal weak staining for TTF-1.  Suggestive of lung origin in the proper clinical context.  Cells were negative for GATA3 PAX8 CDX2 and CK20 and Napsin A.  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  She is currently on hydrocortisone twice daily.  Thyroid functions presently are normal     Patient was treated with [place] N[place] Alimta Keytruda chemotherapy first-line followed by maintenance Alimta and Keytruda.  She developed autoimmune colitis secondary to [facility] and therefore that was stopped.  She is on Entyvio and follows up with GI for her autoimmune colitis.  Most recent regimen was maintenance Alimta. Disease progression in Jan 2024. Patients started on sotorasib.  Presently on full dose 960 mg daily.  Disease progression in May 2024.  Plan to switch to CarboTaxol Avastin  Interval history patient continues to endorse severe pain not adequately controlled with fentanyl patch and as needed Dilaudid which is especially in her right flank.  Reports some trouble sleeping as well.   Review of Systems  Constitutional:  Positive for malaise/fatigue and weight loss. Negative for chills and fever.       Lack of appetite  HENT:  Negative for congestion, ear discharge and nosebleeds.   Eyes:  Negative for blurred vision.  Respiratory:  Negative for cough, hemoptysis, sputum production, shortness of breath and wheezing.   Cardiovascular:  Negative for chest pain, palpitations, orthopnea and claudication.  Gastrointestinal:  Negative for abdominal pain, blood in stool, constipation, diarrhea, heartburn, melena, nausea and vomiting.  Genitourinary:  Negative for dysuria, flank pain, frequency,  hematuria and urgency.  Musculoskeletal:  Negative for back pain, joint pain and myalgias.       Right flank pain  Skin:  Negative for rash.  Neurological:  Negative for dizziness, tingling, focal weakness, seizures, weakness and headaches.  Endo/Heme/Allergies:  Does not bruise/bleed easily.  Psychiatric/Behavioral:  Negative for depression and suicidal ideas. The patient does not have insomnia.     Allergies  Allergen Reactions   Factive [Gemifloxacin] Rash    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   Palpitations    Precordial chest pain    Wrist fracture 03/2021   left    Past Surgical History:  Procedure Laterality Date   BREAST BIOPSY Right 2017   benign   CERVICAL BIOPSY  W/ LOOP ELECTRODE EXCISION     COLONOSCOPY  06/11/2022   Procedure: COLONOSCOPY;  Surgeon: Toney Reil, MD;  Location: [facility] ENDOSCOPY;  Service: Gastroenterology;;   COLONOSCOPY WITH PROPOFOL N/A 07/03/2015   Procedure: COLONOSCOPY WITH PROPOFOL;  Surgeon: Wallace Cullens, MD;  Location[facility] ENDOSCOPY;  Service: Gastroenterology;  Laterality: N/A;   COLONOSCOPY WITH PROPOFOL N/A 01/06/2022   Procedure: COLONOSCOPY WITH PROPOFOL;  Surgeon: Toney Reil, MD;  Location: [facility] ENDOSCOPY;  Service: Gastroenterology;  Laterality: N/A;   ESOPHAGOGASTRODUODENOSCOPY (EGD) WITH PROPOFOL N/A 07/03/2015   Procedure: ESOPHAGOGASTRODUODENOSCOPY (EGD) WITH PROPOFOL;  Surgeon: Wallace Cullens, MD;  Location: [facility] ENDOSCOPY;  Service: Gastroenterology;  Laterality: N/A;   FLEXIBLE SIGMOIDOSCOPY N/A 03/12/2022   Procedure: FLEXIBLE SIGMOIDOSCOPY;  Surgeon: Toney Reil, MD;  Location: [facility] ENDOSCOPY;  Service: Gastroenterology;  Laterality: N/A;   FLEXIBLE SIGMOIDOSCOPY N/A  08/17/2022   Procedure: FLEXIBLE SIGMOIDOSCOPY;  Surgeon: Toney Reil, MD;  Location: [facility] ENDOSCOPY;  Service: Gastroenterology;  Laterality: N/A;   FRACTURE SURGERY     INTRAMEDULLARY (IM) NAIL INTERTROCHANTERIC Right 09/15/2020   Procedure: INTRAMEDULLARY (IM) NAIL INTERTROCHANTRIC;  Surgeon: Christena Flake, MD;  Location: [facility] ORS;  Service: Orthopedics;  Laterality: Right;   IR CV LINE INJECTION  07/14/2022   IR IMAGING GUIDED PORT INSERTION  11/28/2020   LEFT HEART CATH AND CORONARY ANGIOGRAPHY N/A 03/28/2017   Procedure: Left Heart Cath and Coronary Angiography;  Surgeon: Runell Gess, MD;  Location: [facility] INVASIVE CV LAB;  Service: Cardiovascular;  Laterality: N/A;   ORIF WRIST FRACTURE Left 03/31/2021   Procedure: OPEN REDUCTION INTERNAL FIXATION (ORIF) LEFT DISTAL RADIUS FRACTURE.;  Surgeon: Christena Flake, MD;  Location: [facility] ORS;  Service: Orthopedics;  Laterality: Left;    Social History   Socioeconomic History   Marital status: Married    Spouse name: Gene   Number of children: 2   Years of education: Not on file   Highest education level: Not on file  Occupational History   Not on file  Tobacco Use   Smoking status: Former    Packs/day: 1.00    Years: 30.00    Additional pack years: 0.00    Total pack years: 30.00    Types: Cigarettes    Quit date: 09/17/2020  Years since quitting: 2.4   Smokeless tobacco: Never  Vaping Use   Vaping Use: Never used  Substance and Sexual Activity   Alcohol use: Not Currently    Alcohol/week: 1.0 standard drink of alcohol    Types: 1 Standard drinks or equivalent per week    Comment: beer occ.   Drug use: No   Sexual activity: Yes    Birth control/protection: Post-menopausal  Other Topics Concern   Not on file  Social History Narrative   Lives in [place] with her husband.  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   The labs and vital signs and weight was done in infusion    Social Determinants of Health   Financial Resource Strain: Not on file  Food Insecurity: No Food Insecurity (02/11/2023)   Hunger Vital Sign    Worried About Running Out of Food in the Last Year: Never true    Ran Out of Food in the Last Year: Never true  Transportation Needs: No Transportation Needs (02/11/2023)   PRAPARE - Administrator, Civil Service (Medical): No    Lack of Transportation (Non-Medical): No  Physical Activity: Not on file  Stress: Not on file  Social Connections: Not on file  Intimate Partner Violence: Not At Risk (02/11/2023)   Humiliation, Afraid, Rape, and Kick questionnaire    Fear of Current or Ex-Partner: No    Emotionally Abused: No    Physically Abused: No    Sexually Abused: No    Family History  Problem Relation Age of Onset   Diabetes Mother        alive @ 6   Goiter Mother    Heart disease Father        died of MI @ 26   Osteoporosis Maternal Grandmother    Colon cancer Maternal Grandfather    Breast cancer Neg Hx    Ovarian cancer Neg Hx      Current Outpatient Medications:    fentaNYL (DURAGESIC) 25 MCG/HR, Place 1 patch onto the skin every 3 (three) days., Disp: 10 patch, Rfl: 0   gabapentin (NEURONTIN) 300 MG capsule, Take 1 capsule (300 mg total) by mouth 3 (three) times daily., Disp: 90 capsule, Rfl: 3   hydrocortisone (CORTEF) 10 MG tablet, TAKE 1 TABLET BY MOUTH NIGHTLY, Disp: 30 tablet, Rfl: 3   hydrocortisone (CORTEF) 20 MG tablet, TAKE 1 TABLET BY MOUTH EVERY MORNING, Disp: 30 tablet, Rfl: 3   HYDROmorphone (DILAUDID) 2 MG tablet, Take 1-2 tablets (2-4 mg total) by mouth every 4 (four) hours as needed for severe pain., Disp: 120 tablet, Rfl: 0   lidocaine (LIDODERM) 5 %, Place 1 patch onto the skin daily. Remove & Discard patch within 12 hours or as directed by MD, Disp: 30 patch, Rfl: 0   metoprolol tartrate (LOPRESSOR) 25 MG tablet, Take 25 mg by mouth in the morning., Disp: , Rfl:    nitrofurantoin,  macrocrystal-monohydrate, (MACROBID) 100 MG capsule, Take 100 mg by mouth 2 (two) times daily., Disp: , Rfl:    ondansetron (ZOFRAN) 4 MG tablet, TAKE 1 TABLET BY MOUTH EVERY 8 HOURS AS NEEDED FOR NAUSEA AND VOMITING (Patient taking differently: Take 4 mg by mouth every 8 (eight) hours as needed for nausea or vomiting. TAKE 1 TABLET BY MOUTH EVERY 8 HOURS AS NEEDED FOR NAUSEA AND VOMITING), Disp: 20 tablet, Rfl: 1   prochlorperazine (COMPAZINE) 10 MG tablet, Take 1 tablet (10 mg total) by  mouth every 6 (six) hours as needed for nausea or vomiting., Disp: 30 tablet, Rfl: 1   Vedolizumab (ENTYVIO IV), Inject 1 Dose into the vein as directed. Every 4 weeks at her home, Disp: , Rfl:    acetaminophen (TYLENOL) 500 MG tablet, Take 500 mg by mouth every 6 (six) hours as needed. (Patient not taking: Reported on 02/17/2023), Disp: , Rfl:    ALPRAZolam (XANAX) 0.5 MG tablet, Take 1 tablet (0.5 mg total) by mouth daily as needed for anxiety. TAKE ONE TABLET (0.5 MG) BY MOUTH EVERY DAY AS NEEDED, Disp: 30 tablet, Rfl: 0   citalopram (CELEXA) 10 MG tablet, Take 1 tablet (10 mg total) by mouth daily., Disp: 30 tablet, Rfl: 3   dicyclomine (BENTYL) 10 MG capsule, Take 1 capsule (10 mg total) by mouth every 8 (eight) hours as needed for spasms. (Patient not taking: Reported on 02/17/2023), Disp: 30 capsule, Rfl: 0   valACYclovir (VALTREX) 1000 MG tablet, Take 1 tablet (1,000 mg total) by mouth 3 (three) times daily. (Patient not taking: Reported on 02/10/2023), Disp: 30 tablet, Rfl: 0 No current facility-administered medications for this visit.  Facility-Administered Medications Ordered in Other Visits:    sodium chloride flush (NS) 0.9 % injection 10 mL, 10 mL, Intravenous, PRN, Creig Hines, MD, 10 mL at 03/09/21 0906  NM Bone Scan Whole Body  Result Date: 02/14/2023 CLINICAL DATA:  Restaging metastatic lung cancer. EXAM: NUCLEAR MEDICINE WHOLE BODY BONE SCAN TECHNIQUE: Whole body anterior and posterior images were  obtained approximately 3 hours after intravenous injection of radiopharmaceutical. RADIOPHARMACEUTICALS:  20.21 mCi Technetium-52m MDP IV COMPARISON:  12/08/2022 FINDINGS: Again seen is multifocal abnormal areas of increased uptake within the axial and proximal appendicular skeleton and calvarium compatible with widespread osseous metastasis. Lesions are noted within the calvarium, sternum, bilateral ribs, bilateral scapula, thoracic and lumbar spine, bilateral humeri, bilateral femoral, and pelvis. Compared with the previous exam there is been progressive uptake at the approximate T6 and T7 vertebra. Additionally, there is been mild increase in size of the left frontal bone. Normal physiologic tracer activity is identified within the kidneys an urinary bladder. IMPRESSION: 1. Interval increase in tracer uptake associated with previous lesions within the left frontal bone and approximate T6 and T7 vertebra. 2. The remaining lesions appear similar in size and multiplicity. Electronically Signed   By: Signa Kell M.D.   On: 02/14/2023 09:19   DG Lumbar Spine 2-3 Views  Result Date: 02/10/2023 CLINICAL DATA:  Back pain history of cancer EXAM: LUMBAR SPINE - 2-3 VIEW COMPARISON:  CT 02/03/2023 FINDINGS: Grade 1 anterolisthesis L3 on L4 and trace anterolisthesis L2 on L3. Sclerosis within multiple vertebral bodies as well as the pelvis consistent with metastatic disease. Vertebral body heights are maintained. Mild disc space narrowing and degenerative change at L4-L5 and L5-S1. Facet degenerative changes of the mid to lower lumbar spine. IMPRESSION: 1. Mild degenerative changes of the lumbar spine with grade 1 anterolisthesis L3 on L4 and trace anterolisthesis L2 on L3. 2. Sclerosis within multiple vertebral bodies and pelvis consistent with metastatic disease. Electronically Signed   By: Jasmine Pang M.D.   On: 02/10/2023 22:06   CT Angio Chest PE W and/or Wo Contrast  Result Date: 02/10/2023 CLINICAL DATA:   Pulmonary embolism (PE) suspected, high prob EXAM: CT ANGIOGRAPHY CHEST WITH CONTRAST TECHNIQUE: Multidetector CT imaging of the chest was performed using the standard protocol during bolus administration of intravenous contrast. Multiplanar CT image reconstructions and MIPs were obtained to  evaluate the vascular anatomy. RADIATION DOSE REDUCTION: This exam was performed according to the departmental dose-optimization program which includes automated exposure control, adjustment of the mA and/or kV according to patient size and/or use of iterative reconstruction technique. CONTRAST:  75mL OMNIPAQUE IOHEXOL 350 MG/ML SOLN COMPARISON:  Chest XR, 07/01/2020.  CT CAP, 02/03/2023. FINDINGS: Cardiovascular: Satisfactory opacification of the pulmonary arteries to the segmental level. No segmental or larger pulmonary embolus. Normal heart size. No pericardial effusion. Mediastinum/Nodes: Similar degree of RIGHT paratracheal and BILATERAL hilar adenopathy, better appreciated and described on recent comparison CT CAP. Enlarged LEFT axillary lymph nodes. RIGHT jugular-approach chest port with catheter tip well-positioned at the superior cavoatrial junction. Thyroid gland, trachea, and esophagus demonstrate no significant findings. Lungs/Pleura: RIGHT perihilar and peribronchial thickening, unchanged. Mild linear bibasilar atelectasis. No new consolidation, mass or suspicious pulmonary nodule. No pleural effusion or pneumothorax. Upper Abdomen: No acute abnormality. Small 1.4 cm hypodensity of the hepatic dome, incompletely assessed. Musculoskeletal: New fat stranding about the RIGHT thoracic inlet and axilla, incompletely assessed. Multifocal osseous metastases involving the ribs and imaged thoracic spine. Review of the MIP images confirms the above findings. IMPRESSION: 1. No segmental or larger pulmonary embolus. No acute intrathoracic abnormality. 2. New fat stranding about the RIGHT thoracic inlet and axilla, incompletely  assessed. Clinical correlation is recommended. 3. Similar malignant findings within chest, and including multifocal osseous metastases. Electronically Signed   By: Roanna Banning M.D.   On: 02/10/2023 16:58   CT CHEST ABDOMEN PELVIS W CONTRAST  Result Date: 02/04/2023 CLINICAL DATA:  Right lung cancer, adenocarcinoma. Development of skin nodules. Nodules of right shoulder and left axilla. * Tracking Code: BO * EXAM: CT CHEST, ABDOMEN, AND PELVIS WITH CONTRAST TECHNIQUE: Multidetector CT imaging of the chest, abdomen and pelvis was performed following the standard protocol during bolus administration of intravenous contrast. RADIATION DOSE REDUCTION: This exam was performed according to the departmental dose-optimization program which includes automated exposure control, adjustment of the mA and/or kV according to patient size and/or use of iterative reconstruction technique. CONTRAST:  OMNIPAQUE IOHEXOL 300 MG/ML  SOLN COMPARISON:  12/15/2022 FINDINGS: CT CHEST FINDINGS Cardiovascular: Right Port-A-Cath tip high right atrium. Aortic atherosclerosis. Tortuous thoracic aorta. Normal heart size, without pericardial effusion. Multivessel coronary artery atherosclerosis. No central pulmonary embolism, on this non-dedicated study. Mediastinum/Nodes: Right supraclavicular node measures 7 mm on 06/02 and is enlarged from 4-5 mm on the prior. Posterior left axillary node measures 1.5 cm on 10/02 versus 8 mm on the prior (when remeasured). Right paratracheal ill-defined soft tissue density including on 17/2 is similar, difficult to quantify secondary to ill definition of soft tissue planes in this region. Left hilar node measures 1.2 cm in 25/2 and is newly enlarged. Tiny hiatal hernia. Nodularity in the epicardial fat including at up to 7 mm on 48/2, increased from 1-2 mm on the prior. Lungs/Pleura: No pleural fluid. Increase in right perihilar and paramediastinal consolidation and volume loss. The right lower lobe  area of dense consolidation medially has improved and the right lower lobe airspace and ground-glass opacities have resolved. A 2 mm left upper lobe pulmonary nodule on 46/4 is unchanged. The posterior left upper lobe pulmonary nodule detailed on the prior exam measures 4 mm on 55/4 today versus 5 mm on the prior. Musculoskeletal: Anterior right chest wall subcutaneous nodule of 8 mm on 19/2 measured 4 mm on the prior. Diffuse sclerotic osseous metastasis are similar. CT ABDOMEN PELVIS FINDINGS Hepatobiliary: Focal steatosis adjacent the falciform ligament. Normal  gallbladder, without biliary ductal dilatation. Pancreas: Mild hypoattenuation in the pancreatic head is favored to be related to focal fat deposition. No duct dilatation or acute inflammation. Spleen: Normal in size, without focal abnormality. Adrenals/Urinary Tract: Right adrenal thickening is unchanged. Left adrenal calcification and nodularity including up to 1.2 cm on 62/2, decreased from 1.5 cm on the prior. Too small to characterize bilateral renal lesions . In the absence of clinically indicated signs/symptoms require(s) no independent follow-up. No hydronephrosis. Normal urinary bladder. Stomach/Bowel: Normal remainder of the stomach. Normal colon, appendix, and terminal ileum. Normal small bowel. Vascular/Lymphatic: Aortic atherosclerosis. No abdominal retroperitoneal or pelvic sidewall adenopathy. Reproductive: Normal uterus and adnexa. Other: No significant free fluid. Diffuse peritoneal and retroperitoneal nodularity is progressive. Example omental nodule of 11 mm on 59/2 measured 5 mm on the prior (when remeasured). Perinephric nodularity with a right-sided nodule measuring 8 mm on 77/2 versus 6 mm on the prior exam (when remeasured). A perirectal 9 mm nodule on 118/2 measured 5 mm on the prior exam (when remeasured). Musculoskeletal: There are no intramuscular metastasis, with an index 1.3 cm right paraspinal lesion on 85/2. Right proximal  femur fixation. Similar diffuse sclerotic osseous metastasis. Index left iliac lesion of 5.2 cm on 102/2. IMPRESSION: CT CHEST IMPRESSION 1. Nodal progression within the low neck and chest, as detailed above. Most significant nodal enlargement is within the left axilla, right supraclavicular/low cervical and left hilar stations. 2. Evolution of presumed radiation change in the right paramediastinal and perihilar lung. Resolution of right lower lobe dense consolidation and surrounding tree-in-bud nodularity. 3. Similar tiny pulmonary nodules. 4. Similar diffuse osseous metastasis. 5. Developing subcutaneous metastasis. 6. Coronary artery atherosclerosis. Aortic Atherosclerosis (ICD10-I70.0). CT ABDOMEN AND PELVIS IMPRESSION 1. Moderate progression of peritoneal and retroperitoneal metastasis. Developing intramuscular metastasis. 2. No bowel obstruction or other acute complication. 3. Mild decrease in left adrenal metastasis. Electronically Signed   By: Jeronimo Greaves M.D.   On: 02/04/2023 08:31    No images are attached to the encounter.      Latest Ref Rng & Units 02/17/2023    9:15 AM  CMP  Glucose 70 - 99 mg/dL 161   BUN 6 - 20 mg/dL 13   Creatinine 0.96 - 1.00 mg/dL 0.45   Sodium 409 - 811 mmol/L 135   Potassium 3.5 - 5.1 mmol/L 3.6   Chloride 98 - 111 mmol/L 100   CO2 22 - 32 mmol/L 24   Calcium 8.9 - 10.3 mg/dL 8.7   Total Protein 6.5 - 8.1 g/dL 6.9   Total Bilirubin 0.3 - 1.2 mg/dL 0.4   Alkaline Phos 38 - 126 U/L 246   AST 15 - 41 U/L 22   ALT 0 - 44 U/L 22       Latest Ref Rng & Units 02/17/2023    9:15 AM  CBC  WBC 4.0 - 10.5 K/uL 8.1   Hemoglobin 12.0 - 15.0 g/dL 9.8   Hematocrit 91.4 - 46.0 % 31.3   Platelets 150 - 400 K/uL 356      Observation/objective: Appears in no acute distress over video visit today.  Breathing is nonlabored  Assessment and plan: Patient is a 56 year old female with metastatic adenocarcinoma of the lung here for on treatment assessment prior to cycle 1  of CarboTaxol Avastin chemotherapy  Unfortunately patient has progressed on [place] Alimta Keytruda chemotherapy as well as second line sotorasib.  We are planning to get a repeat biopsy for NGS testing.  In the meanwhile we  are proceeding with CarboTaxol Avastin chemotherapy today cycle 1.  She will be seen by covering provider in 3 weeks for cycle 2 and I will see her back in 6 weeks for cycle 3.  Plan to repeat scans after 3 cycles.  Neoplasm related pain: Continue fentanyl patch and as needed Dilaudid and I have asked her to increase gabapentin dosing to 300 mg 3 times daily.  Patient was seen by palliative care today as well and will continue to follow-up with NP Laurette Schimke  Bone metastases: She will receive Zometa today and we will continue with every 3 months  Follow-up instructions:as above  I discussed the assessment and treatment plan with the patient. The patient was provided an opportunity to ask questions and all were answered. The patient agreed with the plan and demonstrated an understanding of the instructions.   The patient was advised to call back or seek an in-person evaluation if the symptoms worsen or if the condition fails to improve as anticipated.  I provided 14 minutes of face-to-face video visit time during this encounter, and > 50% was spent counseling as documented under my assessment & plan.  Visit Diagnosis: 1. Encounter for monitoring zoledronic acid therapy   2. Primary malignant neoplasm of right lung metastatic to other site (HCC)   3. Encounter for antineoplastic chemotherapy     Dr. Owens Shark, MD, MPH [facility] Tel- [phone] 02/20/2023 3:19 PM

## 2023-02-21 ENCOUNTER — Ambulatory Visit: Payer: 59

## 2023-02-21 ENCOUNTER — Other Ambulatory Visit: Payer: Self-pay | Admitting: *Deleted

## 2023-02-21 DIAGNOSIS — C3491 Malignant neoplasm of unspecified part of right bronchus or lung: Secondary | ICD-10-CM

## 2023-02-21 DIAGNOSIS — R9389 Abnormal findings on diagnostic imaging of other specified body structures: Secondary | ICD-10-CM

## 2023-02-21 MED ORDER — PREDNISONE 10 MG PO TABS: 10.0000 mg | ORAL_TABLET | Freq: Every day | ORAL | 0 refills | Status: DC

## 2023-02-22 ENCOUNTER — Inpatient Hospital Stay (HOSPITAL_BASED_OUTPATIENT_CLINIC_OR_DEPARTMENT_OTHER): Payer: 59 | Admitting: Hospice and Palliative Medicine

## 2023-02-22 DIAGNOSIS — Z515 Encounter for palliative care: Secondary | ICD-10-CM

## 2023-02-22 DIAGNOSIS — C3491 Malignant neoplasm of unspecified part of right bronchus or lung: Secondary | ICD-10-CM

## 2023-02-22 DIAGNOSIS — G893 Neoplasm related pain (acute) (chronic): Secondary | ICD-10-CM | POA: Diagnosis not present

## 2023-02-22 DIAGNOSIS — K523 Indeterminate colitis: Secondary | ICD-10-CM | POA: Diagnosis not present

## 2023-02-22 NOTE — Progress Notes (Signed)
Virtual Visit via Telephone Note  I connected with Gina Sanchez on 02/22/23 at  3:20 PM EDT by telephone and verified that I am speaking with the correct person using two identifiers.  Location: Patient: Home Provider: Clinic   I discussed the limitations, risks, security and privacy concerns of performing an evaluation and management service by telephone and the availability of in person appointments. I also discussed with the patient that there may be a patient responsible charge related to this service. The patient expressed understanding and agreed to proceed.   History of Present Illness: Gina Sanchez is a 57 y.o. 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     CT of the chest abdomen pelvis on 12/15/2022 showed mixed response with improved pulmonary nodules and thoracic adenopathy but new widespread sclerotic osseous mets.   Observations/Objective: I called and spoke with patient by phone.  She reports that she is feeling much better after recent dose increase of her gabapentin to 300 mg 3 times daily.  She was having burning flank pain but patient says that that is much improved.  Overall, she says that her pain is now stable.  She denies any adverse effects.  No new symptomatic complaints or concerns today.  Assessment and Plan: Neoplasm related pain -continue fentanyl/hydromorphone/gabapentin.  Follow Up Instructions: Follow-up telephone visit 1 month   I discussed the assessment and treatment plan with the patient. The patient was provided an opportunity to ask questions and all were answered. The patient agreed with the plan and demonstrated an understanding of the instructions.   The patient was advised to call back or seek an in-person evaluation if the symptoms worsen or if the condition fails to improve as anticipated.  I provided 10 minutes of non-face-to-face time during this  encounter.   Malachy Moan, NP

## 2023-02-24 ENCOUNTER — Other Ambulatory Visit: Payer: Self-pay | Admitting: Oncology

## 2023-02-24 ENCOUNTER — Telehealth: Payer: Self-pay | Admitting: *Deleted

## 2023-02-24 DIAGNOSIS — C3490 Malignant neoplasm of unspecified part of unspecified bronchus or lung: Secondary | ICD-10-CM

## 2023-02-24 DIAGNOSIS — C3491 Malignant neoplasm of unspecified part of right bronchus or lung: Secondary | ICD-10-CM

## 2023-02-24 NOTE — Telephone Encounter (Signed)
I called the patient at her home and got the voicemail.  I left her a message to say that she is going to get her biopsy of those 2 spots that she can feel underneath her skin.  [facility] and arrive at 12:30p. She will have local numbing so she can eat, drink, and drive herself if she needs to.  Asked her to call if she has any problems or if the date is not good for them.

## 2023-02-24 NOTE — Telephone Encounter (Signed)
The patient called back through triage and said that she cannot make that date and need to do another date I called patient back got her voicemail and left her message that she could call me back if it is before 5:00 today we will try to work it out otherwise she can get it from [facility] who is one of the teammates here on Dr. Assunta Gambles team..

## 2023-02-25 ENCOUNTER — Telehealth: Payer: 59 | Admitting: Hospice and Palliative Medicine

## 2023-02-25 NOTE — Progress Notes (Signed)
Pernell Dupre, MD sent to Gina Sanchez Approved for right supraclav or left axillary core needle biopsy. Thanks.  [name]       Previous Messages    ----- Message ----- From: Gina Sanchez Sent: 02/21/2023   1:22 PM EDT To: Pernell Dupre, MD Subject: ATTN: Dr Durward Parcel   US Biopsy                  IR Approval Request:   Procedure:   US guided Core RT Supraclavicular LN Biopsy  Reason:       RT Supraclavicular LN  History:        US soft tissue Upper LT & RT extremity LTD   02/08/2023  Provider:      Dr Owens Shark, MD  Provider Contact #   [facility]   [phone]

## 2023-03-01 ENCOUNTER — Other Ambulatory Visit: Payer: 59

## 2023-03-01 ENCOUNTER — Ambulatory Visit
Admission: RE | Admit: 2023-03-01 | Discharge: 2023-03-01 | Disposition: A | Discharge status: Home / Self Care | Payer: 59 | Attending: Family Medicine | Admitting: Family Medicine

## 2023-03-01 ENCOUNTER — Other Ambulatory Visit: Payer: Self-pay | Admitting: Family Medicine

## 2023-03-01 ENCOUNTER — Ambulatory Visit
Admission: RE | Admit: 2023-03-01 | Discharge: 2023-03-01 | Disposition: A | Discharge status: Home / Self Care | Payer: 59 | Source: Ambulatory Visit | Attending: Family Medicine | Admitting: Family Medicine

## 2023-03-01 ENCOUNTER — Telehealth: Payer: Self-pay | Admitting: *Deleted

## 2023-03-01 DIAGNOSIS — R062 Wheezing: Secondary | ICD-10-CM | POA: Insufficient documentation

## 2023-03-01 DIAGNOSIS — I10 Essential (primary) hypertension: Secondary | ICD-10-CM | POA: Diagnosis not present

## 2023-03-01 DIAGNOSIS — R5383 Other fatigue: Secondary | ICD-10-CM | POA: Diagnosis not present

## 2023-03-01 DIAGNOSIS — C3490 Malignant neoplasm of unspecified part of unspecified bronchus or lung: Secondary | ICD-10-CM | POA: Diagnosis not present

## 2023-03-01 NOTE — Telephone Encounter (Signed)
Call returned to patient it went to voice mail so I am sending a MyChart message to her

## 2023-03-01 NOTE — Telephone Encounter (Addendum)
Patient called stating that she is going to see her Phone call this afternoon and wants to know if it would be ok to get a B12 injection while there. Please return her call

## 2023-03-01 NOTE — Telephone Encounter (Signed)
yes

## 2023-03-02 DIAGNOSIS — K5190 Ulcerative colitis, unspecified, without complications: Secondary | ICD-10-CM | POA: Diagnosis not present

## 2023-03-02 DIAGNOSIS — C3491 Malignant neoplasm of unspecified part of right bronchus or lung: Secondary | ICD-10-CM | POA: Diagnosis not present

## 2023-03-02 DIAGNOSIS — K523 Indeterminate colitis: Secondary | ICD-10-CM | POA: Diagnosis not present

## 2023-03-03 ENCOUNTER — Other Ambulatory Visit: Payer: Self-pay | Admitting: *Deleted

## 2023-03-03 ENCOUNTER — Ambulatory Visit: Payer: 59 | Attending: Radiation Oncology | Admitting: Radiation Oncology

## 2023-03-03 MED ORDER — DRONABINOL 2.5 MG PO CAPS: 2.5000 mg | ORAL_CAPSULE | Freq: Two times a day (BID) | ORAL | 0 refills | Status: DC

## 2023-03-03 NOTE — Telephone Encounter (Signed)
Patient requesting appetite stimulant. Script pended per v/o Josh for marinol 2.5 mg.

## 2023-03-03 NOTE — Telephone Encounter (Signed)
Yes. I will add her to my schedule to see on 6/27 while she is here for next infusion.

## 2023-03-03 NOTE — Telephone Encounter (Signed)
Your information has been submitted to [employer]. To check for an updated outcome later, reopen this PA request from your dashboard. If [employer] has not responded to your request within 24 hours, contact [employer] at [phone]. If you think there may be a problem with your PA request, use our live chat feature at the bottom right.    

## 2023-03-03 NOTE — Telephone Encounter (Addendum)
Aleya Peraza (Key: [id number]) PA submitted for Marinol

## 2023-03-08 ENCOUNTER — Telehealth: Payer: Self-pay | Admitting: *Deleted

## 2023-03-08 NOTE — Telephone Encounter (Signed)
-----   Message from Reggy Eye sent at 03/07/2023  8:31 AM EDT ----- Regarding: NOTICE OF ADVERSE DECISION- [employer] NOTICE OF ADVERSE DECISION- [employer] sent to her chart.

## 2023-03-08 NOTE — Telephone Encounter (Signed)
Marinol declined by insurance Pa resumbitted- new covermymeds id-Magaby Corkum (Key: [id number])

## 2023-03-09 ENCOUNTER — Other Ambulatory Visit: Payer: Self-pay | Admitting: Hospice and Palliative Medicine

## 2023-03-09 MED ORDER — OLANZAPINE 5 MG PO TABS: 5.0000 mg | ORAL_TABLET | Freq: Every day | ORAL | 1 refills | Status: DC

## 2023-03-09 MED FILL — Dexamethasone Sodium Phosphate Inj 100 MG/10ML: INTRAMUSCULAR | Qty: 1 | Status: AC

## 2023-03-09 MED FILL — Fosaprepitant Dimeglumine For IV Infusion 150 MG (Base Eq): INTRAVENOUS | Qty: 5 | Status: AC

## 2023-03-09 NOTE — Telephone Encounter (Signed)
Script denied for the 2nd time.

## 2023-03-09 NOTE — Progress Notes (Signed)
Insurance would not cover Marinol. Would not recommend Megace due to increased clotting risk. Patient on citalopram. Could rotate to mirtazapine if needed. However, will try olanzapine as this may help improve anxiety, sleep, appetite, and nausea.

## 2023-03-10 ENCOUNTER — Inpatient Hospital Stay: Payer: 59 | Admitting: Internal Medicine

## 2023-03-10 ENCOUNTER — Inpatient Hospital Stay: Payer: 59

## 2023-03-10 VITALS — BP 111/70 | HR 92 | Temp 97.5°F | Resp 19

## 2023-03-10 VITALS — BP 106/82 | HR 88 | Temp 98.8°F | Wt 166.0 lb

## 2023-03-10 DIAGNOSIS — C3491 Malignant neoplasm of unspecified part of right bronchus or lung: Secondary | ICD-10-CM

## 2023-03-10 DIAGNOSIS — E2740 Unspecified adrenocortical insufficiency: Secondary | ICD-10-CM | POA: Diagnosis not present

## 2023-03-10 DIAGNOSIS — Z9221 Personal history of antineoplastic chemotherapy: Secondary | ICD-10-CM | POA: Diagnosis not present

## 2023-03-10 DIAGNOSIS — K219 Gastro-esophageal reflux disease without esophagitis: Secondary | ICD-10-CM | POA: Diagnosis not present

## 2023-03-10 DIAGNOSIS — Z5189 Encounter for other specified aftercare: Secondary | ICD-10-CM | POA: Diagnosis not present

## 2023-03-10 DIAGNOSIS — G893 Neoplasm related pain (acute) (chronic): Secondary | ICD-10-CM | POA: Diagnosis not present

## 2023-03-10 DIAGNOSIS — R630 Anorexia: Secondary | ICD-10-CM | POA: Diagnosis not present

## 2023-03-10 DIAGNOSIS — K449 Diaphragmatic hernia without obstruction or gangrene: Secondary | ICD-10-CM | POA: Diagnosis not present

## 2023-03-10 DIAGNOSIS — Z87891 Personal history of nicotine dependence: Secondary | ICD-10-CM | POA: Diagnosis not present

## 2023-03-10 DIAGNOSIS — Z5111 Encounter for antineoplastic chemotherapy: Secondary | ICD-10-CM | POA: Diagnosis not present

## 2023-03-10 DIAGNOSIS — C7970 Secondary malignant neoplasm of unspecified adrenal gland: Secondary | ICD-10-CM | POA: Diagnosis not present

## 2023-03-10 DIAGNOSIS — Z79899 Other long term (current) drug therapy: Secondary | ICD-10-CM | POA: Diagnosis not present

## 2023-03-10 DIAGNOSIS — R229 Localized swelling, mass and lump, unspecified: Secondary | ICD-10-CM | POA: Diagnosis not present

## 2023-03-10 DIAGNOSIS — C778 Secondary and unspecified malignant neoplasm of lymph nodes of multiple regions: Secondary | ICD-10-CM | POA: Diagnosis not present

## 2023-03-10 DIAGNOSIS — I700 Atherosclerosis of aorta: Secondary | ICD-10-CM | POA: Diagnosis not present

## 2023-03-10 DIAGNOSIS — M47816 Spondylosis without myelopathy or radiculopathy, lumbar region: Secondary | ICD-10-CM | POA: Diagnosis not present

## 2023-03-10 DIAGNOSIS — C786 Secondary malignant neoplasm of retroperitoneum and peritoneum: Secondary | ICD-10-CM | POA: Diagnosis not present

## 2023-03-10 DIAGNOSIS — I2510 Atherosclerotic heart disease of native coronary artery without angina pectoris: Secondary | ICD-10-CM | POA: Diagnosis not present

## 2023-03-10 DIAGNOSIS — M4316 Spondylolisthesis, lumbar region: Secondary | ICD-10-CM | POA: Diagnosis not present

## 2023-03-10 DIAGNOSIS — I10 Essential (primary) hypertension: Secondary | ICD-10-CM | POA: Diagnosis not present

## 2023-03-10 DIAGNOSIS — C7951 Secondary malignant neoplasm of bone: Secondary | ICD-10-CM | POA: Diagnosis not present

## 2023-03-10 LAB — CMP (CANCER CENTER ONLY)
ALT: 17 U/L (ref 0–44)
AST: 19 U/L (ref 15–41)
Albumin: 3.2 g/dL — ABNORMAL LOW (ref 3.5–5.0)
Alkaline Phosphatase: 184 U/L — ABNORMAL HIGH (ref 38–126)
Anion gap: 8 (ref 5–15)
BUN: 15 mg/dL (ref 6–20)
CO2: 24 mmol/L (ref 22–32)
Calcium: 8.4 mg/dL — ABNORMAL LOW (ref 8.9–10.3)
Chloride: 106 mmol/L (ref 98–111)
Creatinine: 0.48 mg/dL (ref 0.44–1.00)
GFR, Estimated: >60 mL/min (ref >60)
Glucose, Bld: 97 mg/dL (ref 70–99)
Potassium: 3.6 mmol/L (ref 3.5–5.1)
Sodium: 138 mmol/L (ref 135–145)
Total Bilirubin: 0.3 mg/dL (ref 0.3–1.2)
Total Protein: 6.8 g/dL (ref 6.5–8.1)

## 2023-03-10 LAB — CBC WITH DIFFERENTIAL (CANCER CENTER ONLY)
Abs Immature Granulocytes: 0.07 10*3/uL (ref 0.00–0.07)
Basophils Absolute: 0.1 10*3/uL (ref 0.0–0.1)
Basophils Relative: 1 %
Eosinophils Absolute: 0.1 10*3/uL (ref 0.0–0.5)
Eosinophils Relative: 1 %
HCT: 31.5 % — ABNORMAL LOW (ref 36.0–46.0)
Hemoglobin: 10 g/dL — ABNORMAL LOW (ref 12.0–15.0)
Immature Granulocytes: 1 %
Lymphocytes Relative: 13 %
Lymphs Abs: 1.2 10*3/uL (ref 0.7–4.0)
MCH: 26 pg (ref 26.0–34.0)
MCHC: 31.7 g/dL (ref 30.0–36.0)
MCV: 82 fL (ref 80.0–100.0)
Monocytes Absolute: 0.8 10*3/uL (ref 0.1–1.0)
Monocytes Relative: 8 %
Neutro Abs: 7.3 10*3/uL (ref 1.7–7.7)
Neutrophils Relative %: 76 %
Platelet Count: 236 10*3/uL (ref 150–400)
RBC: 3.84 MIL/uL — ABNORMAL LOW (ref 3.87–5.11)
RDW: 21.2 % — ABNORMAL HIGH (ref 11.5–15.5)
WBC Count: 9.5 10*3/uL (ref 4.0–10.5)
nRBC: 0 % (ref 0.0–0.2)

## 2023-03-10 LAB — PROTEIN, URINE, RANDOM: Total Protein, Urine: 34 mg/dL

## 2023-03-10 MED ORDER — ACETAMINOPHEN 325 MG PO TABS
650.0000 mg | ORAL_TABLET | Freq: Once | ORAL | Status: AC
  Administered 2023-03-10: 650 mg via ORAL
  Filled 2023-03-10: qty 2

## 2023-03-10 MED ORDER — DIPHENHYDRAMINE HCL 50 MG/ML IJ SOLN
50.0000 mg | Freq: Once | INTRAMUSCULAR | Status: AC
  Administered 2023-03-10: 50 mg via INTRAVENOUS
  Filled 2023-03-10: qty 1

## 2023-03-10 MED ORDER — HEPARIN SOD (PORK) LOCK FLUSH 100 UNIT/ML IV SOLN
500.0000 [IU] | Freq: Once | INTRAVENOUS | Status: AC | PRN
  Administered 2023-03-10: 500 [IU]
  Filled 2023-03-10: qty 5

## 2023-03-10 MED ORDER — SODIUM CHLORIDE 0.9 % IV SOLN
Freq: Once | INTRAVENOUS | Status: AC
  Filled 2023-03-10: qty 250

## 2023-03-10 MED ORDER — PALONOSETRON HCL INJECTION 0.25 MG/5ML
0.2500 mg | Freq: Once | INTRAVENOUS | Status: AC
  Administered 2023-03-10: 0.25 mg via INTRAVENOUS
  Filled 2023-03-10: qty 5

## 2023-03-10 MED ORDER — SODIUM CHLORIDE 0.9 % IV SOLN
616.0000 mg | Freq: Once | INTRAVENOUS | Status: AC
  Administered 2023-03-10: 620 mg via INTRAVENOUS
  Filled 2023-03-10: qty 62

## 2023-03-10 MED ORDER — FAMOTIDINE IN NACL 20-0.9 MG/50ML-% IV SOLN
20.0000 mg | Freq: Once | INTRAVENOUS | Status: AC
  Administered 2023-03-10: 20 mg via INTRAVENOUS
  Filled 2023-03-10: qty 50

## 2023-03-10 MED ORDER — SODIUM CHLORIDE 0.9 % IV SOLN
15.0000 mg/kg | Freq: Once | INTRAVENOUS | Status: AC
  Administered 2023-03-10: 1200 mg via INTRAVENOUS
  Filled 2023-03-10: qty 48

## 2023-03-10 MED ORDER — SODIUM CHLORIDE 0.9 % IV SOLN
175.0000 mg/m2 | Freq: Once | INTRAVENOUS | Status: AC
  Administered 2023-03-10: 336 mg via INTRAVENOUS
  Filled 2023-03-10: qty 56

## 2023-03-10 MED ORDER — FENTANYL 25 MCG/HR TD PT72: 1.0000 | MEDICATED_PATCH | TRANSDERMAL | 0 refills | Status: DC

## 2023-03-10 MED ORDER — SODIUM CHLORIDE 0.9 % IV SOLN
10.0000 mg | Freq: Once | INTRAVENOUS | Status: AC
  Administered 2023-03-10: 10 mg via INTRAVENOUS
  Filled 2023-03-10: qty 10

## 2023-03-10 MED ORDER — ACETAMINOPHEN 325 MG PO TABS: 650.0000 mg | ORAL_TABLET | Freq: Once | ORAL | Status: DC

## 2023-03-10 MED ORDER — SODIUM CHLORIDE 0.9 % IV SOLN
150.0000 mg | Freq: Once | INTRAVENOUS | Status: AC
  Administered 2023-03-10: 150 mg via INTRAVENOUS
  Filled 2023-03-10: qty 150

## 2023-03-10 NOTE — Progress Notes (Signed)
Patient is having lack of appetite along with no energy. Patient also said that she is needing a refill on her fentanyl.

## 2023-03-10 NOTE — Progress Notes (Signed)
Nutrition Assessment   Reason for Assessment:  Poor appetite, weight loss   ASSESSMENT:   57 year old female with stage IV adenocarcinoma of lung with bone, lymph node and adrenal involvement.  Past medical history of HTN, HLD, anxiety, GERD, colitis, surgery for right femoral shaft fracture.  Patient receiving carboplatin, taxol, avastin.    Met with patient during infusion. Reports that appetite is a little bit better.  Tends to want to eat more in the evening.  Marinol not covered by insurance and has not yet picked up the olanzapine.  Yesterday able to eat brains and eggs and ham biscuit with grits for breakfast.  Evening meal with beef taco with refried beans and rice.  Drinking oral nutrition supplement shakes.  Denies nausea.    Patient feeling loopy during visit due to benadryl.   Medications: compazine, zyprexa, zofran   Labs: reviewed   Anthropometrics:   Height: 66 inches Weight: 166 lb UBW: 192 lb 2/8 BMI: 27  14% weight loss in the last 4 months, significant   Estimated Energy Needs  Kcals: 4098-1191 Protein: 94-113g Fluid: 1875-2250 ml   NUTRITION DIAGNOSIS: Inadequate oral intake related to cancer and related treatment side effects as evidenced by 14% weight loss in the last 4 months, decreased appetite   INTERVENTION:  Discussed ways to add calories and protein in diet.  Handout on High Calorie, High Protein diet and snack ideas given to patient. Encouraged 350 + calorie shake. Coupons and list of these shakes provided Contact information provided   MONITORING, EVALUATION, GOAL: weight trends, intake   Next Visit: Wednesday, July 17 during infusion  Temperance Kelemen B. Freida Busman, RD, LDN Registered Dietitian [phone]

## 2023-03-10 NOTE — Progress Notes (Signed)
Hematology/Oncology Consult note [facility]  [phone] [phone]  Patient Care Team: Armando Gang, FNP as PCP - General (Family Medicine) Glory Buff, RN as Oncology Nurse Navigator Carmina Miller, MD as Radiation Oncologist (Radiation Oncology) Vida Rigger, MD as Consulting Physician (Pulmonary Disease)   Name of the patient: Gina Sanchez  [id number]  1966-01-11   Date of visit: 03/10/23  Diagnosis-metastatic adenocarcinoma of the lung  Chief complaint/ Reason for visit-discussed with the management of lung cancer  Heme/Onc history:   Patient is a 57 year old female with a past medical history significant for hypertension hyperlipidemia and anxiety who presented with right thigh pain and was found to have an acute right proximal femoral shaft fracture.  She underwent operative fixation on 10/12/2020.  MRI of femur showed heterogeneously enhancing osseous lesion within the area which would be nonspecific versus office neoplasm or metastatic lesion.  CT chest abdomen and pelvis with contrast showed an enlarged pretracheal lymph node 2.2 x 1.5 cm and a right paratracheal lymph node measuring 2.7 x 1.3 cm.  Prevascular node measuring 0.3 x 0.9 cm.  5 x 4 mm right middle lobe nodule.  2.8 x 1.9 cm left adrenal lesion.    Reamings from the right femur showed metastatic poorly differentiated carcinoma.  Immunohistochemistry showed was positive for pancytokeratin, CK7 and patchy CK20 with patchy dim expression of TTF-1.  Cells negative for Melan-A, CDX2, PAX8, Napsin A, GATA3, p40, CD56, p16 and thyroglobulin.  Findings compatible with metastatic carcinoma but because of decalcification immunohistochemical staining is unreliable.  Patchy dim staining with TTF-1 suspicious for lung primary but not a definitive diagnosis.   Repeat supraclavicular lymph node biopsy showed metastatic adenocarcinoma.  Tumor cells positive for CK7 with focal  weak staining for TTF-1.  Suggestive of lung origin in the proper clinical context.  Cells were negative for GATA3 PAX8 CDX2 and CK20 and Napsin A.  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  She is currently on hydrocortisone twice daily.  Thyroid functions presently are normal   Patient was treated with Ledell Noss Alimta Keytruda chemotherapy first-line followed by maintenance Alimta and Keytruda.  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  She is on Entyvio and follows up with GI for her autoimmune colitis.  Most recent regimen was maintenance Alimta. Disease progression in Jan 2024. Patients started on sotorasib.  Presently on full dose 960 mg daily.  Disease progression in May 2024.  Switch to CarboTaxol Avastin on 02/17/2023.  She is also planned to get right supraclavicular lymph node biopsy to send for repeat NGS testing.  Interval history- Patient seen today accompanied by husband prior to cycle 2 of carbo Taxol and Avastin. She is tolerating treatment well overall.  Her major concern today is lack of energy.  Reports that she had a week of prednisone for the same reason which helped.  Denies any nausea, vomiting.  Appetite is fair.  Pain is well-controlled with fentanyl patches and requesting for refill today.  Has been following with palliative.  Was started on olanzapine which she will pick up today.  Reports her lymph nodes are shrinking down and no longer palpable.  ECOG PS- 1 Pain scale- 3 Opioid associated constipation- no  Review of systems- Review of Systems  Constitutional:  Positive for malaise/fatigue and weight loss.  Negative for chills and fever.  HENT:  Negative for congestion, ear discharge and nosebleeds.   Eyes:  Negative for blurred vision.  Respiratory:  Negative for cough, hemoptysis, sputum  production, shortness of breath and wheezing.   Cardiovascular:  Negative for chest pain, palpitations, orthopnea and claudication.  Gastrointestinal:  Negative for blood in stool, constipation, diarrhea, heartburn, melena, nausea and vomiting.  Genitourinary:  Negative for dysuria, flank pain, frequency, hematuria and urgency.  Musculoskeletal:  Negative for back pain, joint pain and myalgias.  Skin:  Negative for rash.  Neurological:  Negative for dizziness, tingling, focal weakness, seizures, weakness and headaches.  Endo/Heme/Allergies:  Does not bruise/bleed easily.  Psychiatric/Behavioral:  Negative for depression and suicidal ideas. The patient does not have insomnia.       Allergies  Allergen Reactions   Factive [Gemifloxacin] Rash     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   Palpitations    Precordial chest pain    Wrist fracture 03/2021   left     Past Surgical History:  Procedure Laterality Date   BREAST BIOPSY Right 2017   benign   CERVICAL BIOPSY  W/ LOOP ELECTRODE EXCISION     COLONOSCOPY  06/11/2022   Procedure: COLONOSCOPY;  Surgeon: Toney Reil, MD;  Location: [facility] ENDOSCOPY;  Service: Gastroenterology;;   COLONOSCOPY WITH PROPOFOL N/A 07/03/2015   Procedure: COLONOSCOPY WITH PROPOFOL;  Surgeon: Wallace Cullens, MD;  Location: [facility] ENDOSCOPY;  Service: Gastroenterology;  Laterality: N/A;   COLONOSCOPY WITH PROPOFOL N/A 01/06/2022   Procedure: COLONOSCOPY WITH PROPOFOL;  Surgeon: Toney Reil, MD;  Location: [facility] ENDOSCOPY;  Service: Gastroenterology;  Laterality: N/A;   ESOPHAGOGASTRODUODENOSCOPY (EGD) WITH PROPOFOL N/A 07/03/2015   Procedure: ESOPHAGOGASTRODUODENOSCOPY (EGD) WITH PROPOFOL;  Surgeon: Wallace Cullens, MD;   Location: [facility] ENDOSCOPY;  Service: Gastroenterology;  Laterality: N/A;   FLEXIBLE SIGMOIDOSCOPY N/A 03/12/2022   Procedure: FLEXIBLE SIGMOIDOSCOPY;  Surgeon: Toney Reil, MD;  Location: [facility] ENDOSCOPY;  Service: Gastroenterology;  Laterality: N/A;   FLEXIBLE SIGMOIDOSCOPY N/A 08/17/2022   Procedure: FLEXIBLE SIGMOIDOSCOPY;  Surgeon: Toney Reil, MD;  Location: [facility] ENDOSCOPY;  Service: Gastroenterology;  Laterality: N/A;   FRACTURE SURGERY     INTRAMEDULLARY (IM) NAIL INTERTROCHANTERIC Right 09/15/2020   Procedure: INTRAMEDULLARY (IM) NAIL INTERTROCHANTRIC;  Surgeon: Christena Flake, MD;  Location: [facility] ORS;  Service: Orthopedics;  Laterality: Right;   IR CV LINE INJECTION  07/14/2022   IR IMAGING GUIDED PORT INSERTION  11/28/2020   LEFT HEART CATH AND CORONARY ANGIOGRAPHY N/A 03/28/2017   Procedure: Left Heart Cath and Coronary Angiography;  Surgeon: Runell Gess, MD;  Location: [facility] INVASIVE CV LAB;  Service: Cardiovascular;  Laterality: N/A;   ORIF WRIST FRACTURE Left 03/31/2021   Procedure: OPEN REDUCTION INTERNAL FIXATION (ORIF) LEFT DISTAL RADIUS FRACTURE.;  Surgeon: Christena Flake, MD;  Location: [facility] ORS;  Service: Orthopedics;  Laterality: Left;    Social History   Socioeconomic History   Marital status: Married    Spouse name: Gene   Number of children: 2   Years of education: Not on file   Highest education level: Not on file  Occupational History   Not on file  Tobacco  Use   Smoking status: Former    Packs/day: 1.00    Years: 30.00    Additional pack years: 0.00    Total pack years: 30.00    Types: Cigarettes    Quit date: 09/17/2020    Years since quitting: 2.4   Smokeless tobacco: Never  Vaping Use   Vaping Use: Never used  Substance and Sexual Activity   Alcohol use: Not Currently    Alcohol/week: 1.0 standard drink of alcohol    Types: 1 Standard drinks or equivalent per week    Comment: beer occ.   Drug use: No   Sexual activity: Yes    Birth  control/protection: Post-menopausal  Other Topics Concern   Not on file  Social History Narrative   Lives in [place] with her husband.  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   The labs and vital signs and weight was done in infusion   Social Determinants of Health   Financial Resource Strain: Not on file  Food Insecurity: No Food Insecurity (02/11/2023)   Hunger Vital Sign    Worried About Running Out of Food in the Last Year: Never true    Ran Out of Food in the Last Year: Never true  Transportation Needs: No Transportation Needs (02/11/2023)   PRAPARE - Administrator, Civil Service (Medical): No    Lack of Transportation (Non-Medical): No  Physical Activity: Not on file  Stress: Not on file  Social Connections: Not on file  Intimate Partner Violence: Not At Risk (02/11/2023)   Humiliation, Afraid, Rape, and Kick questionnaire    Fear of Current or Ex-Partner: No    Emotionally Abused: No    Physically Abused: No    Sexually Abused: No    Family History  Problem Relation Age of Onset   Diabetes Mother        alive @ 55   Goiter Mother    Heart disease Father        died of MI @ 10   Osteoporosis Maternal Grandmother    Colon cancer Maternal Grandfather    Breast cancer Neg Hx    Ovarian cancer Neg Hx      Current Outpatient Medications:    ALPRAZolam (XANAX) 0.5 MG tablet, Take 1 tablet (0.5 mg total) by mouth daily as needed for anxiety. TAKE ONE TABLET (0.5 MG) BY MOUTH EVERY DAY AS NEEDED, Disp: 30 tablet, Rfl: 0   citalopram (CELEXA) 10 MG tablet, Take 1 tablet (10 mg total) by mouth daily., Disp: 30 tablet, Rfl: 3   dronabinol (MARINOL) 2.5 MG capsule, Take 1 capsule (2.5 mg total) by mouth 2 (two) times daily before a meal., Disp: 60 capsule, Rfl: 0   gabapentin (NEURONTIN) 300 MG capsule, Take 1 capsule (300 mg total) by mouth 3 (three) times daily., Disp: 90 capsule, Rfl: 3   hydrocortisone (CORTEF) 10  MG tablet, TAKE 1 TABLET BY MOUTH NIGHTLY, Disp: 30 tablet, Rfl: 3   hydrocortisone (CORTEF) 20 MG tablet, TAKE 1 TABLET BY MOUTH EVERY MORNING, Disp: 30 tablet, Rfl: 3   HYDROmorphone (DILAUDID) 2 MG tablet, Take 1-2 tablets (2-4 mg total) by mouth every 4 (four) hours as needed for severe pain., Disp: 120 tablet, Rfl: 0   lidocaine (LIDODERM) 5 %, Place 1 patch onto the skin daily. Remove & Discard patch within 12 hours or as directed by MD, Disp: 30 patch, Rfl: 0   metoprolol tartrate (LOPRESSOR) 25  MG tablet, Take 25 mg by mouth in the morning., Disp: , Rfl:    nitrofurantoin, macrocrystal-monohydrate, (MACROBID) 100 MG capsule, Take 100 mg by mouth 2 (two) times daily., Disp: , Rfl:    OLANZapine (ZYPREXA) 5 MG tablet, Take 1 tablet (5 mg total) by mouth at bedtime., Disp: 30 tablet, Rfl: 1   ondansetron (ZOFRAN) 4 MG tablet, TAKE 1 TABLET BY MOUTH EVERY 8 HOURS AS NEEDED FOR NAUSEA AND VOMITING (Patient taking differently: Take 4 mg by mouth every 8 (eight) hours as needed for nausea or vomiting. TAKE 1 TABLET BY MOUTH EVERY 8 HOURS AS NEEDED FOR NAUSEA AND VOMITING), Disp: 20 tablet, Rfl: 1   predniSONE (DELTASONE) 10 MG tablet, Take 1 tablet (10 mg total) by mouth daily with breakfast., Disp: 7 tablet, Rfl: 0   prochlorperazine (COMPAZINE) 10 MG tablet, Take 1 tablet (10 mg total) by mouth every 6 (six) hours as needed for nausea or vomiting., Disp: 30 tablet, Rfl: 1   Vedolizumab (ENTYVIO IV), Inject 1 Dose into the vein as directed. Every 4 weeks at her home, Disp: , Rfl:    acetaminophen (TYLENOL) 500 MG tablet, Take 500 mg by mouth every 6 (six) hours as needed. (Patient not taking: Reported on 02/17/2023), Disp: , Rfl:    dicyclomine (BENTYL) 10 MG capsule, Take 1 capsule (10 mg total) by mouth every 8 (eight) hours as needed for spasms. (Patient not taking: Reported on 02/17/2023), Disp: 30 capsule, Rfl: 0   fentaNYL (DURAGESIC) 25 MCG/HR, Place 1 patch onto the skin every 3 (three) days.,  Disp: 10 patch, Rfl: 0   valACYclovir (VALTREX) 1000 MG tablet, Take 1 tablet (1,000 mg total) by mouth 3 (three) times daily. (Patient not taking: Reported on 02/10/2023), Disp: 30 tablet, Rfl: 0  Current Facility-Administered Medications:    acetaminophen (TYLENOL) tablet 650 mg, 650 mg, Oral, Once, Michaelyn Barter, MD  Facility-Administered Medications Ordered in Other Visits:    bevacizumab-awwb (MVASI) 1,200 mg in sodium chloride 0.9 % 100 mL chemo infusion, 15 mg/kg (Treatment Plan Recorded), Intravenous, Once, Creig Hines, MD, Last Rate: 296 mL/hr at 03/10/23 1135, 1,200 mg at 03/10/23 1135   CARBOplatin (PARAPLATIN) 620 mg in sodium chloride 0.9 % 250 mL chemo infusion, 620 mg, Intravenous, Once, Creig Hines, MD   PACLitaxel (TAXOL) 336 mg in sodium chloride 0.9 % 500 mL chemo infusion (> 80mg /m2), 175 mg/m2 (Treatment Plan Recorded), Intravenous, Once, Creig Hines, MD   sodium chloride flush (NS) 0.9 % injection 10 mL, 10 mL, Intravenous, PRN, Creig Hines, MD, 10 mL at 03/09/21 0906  Physical exam:  Vitals:   03/10/23 0941  BP: 106/82  Pulse: 88  Temp: 98.8 F (37.1 C)  TempSrc: Tympanic  SpO2: 100%  Weight: 166 lb (75.3 kg)   Physical Exam Cardiovascular:     Rate and Rhythm: Normal rate and regular rhythm.     Heart sounds: Normal heart sounds.  Pulmonary:     Effort: Pulmonary effort is normal.     Breath sounds: Normal breath sounds.  Abdominal:     General: Bowel sounds are normal.     Palpations: Abdomen is soft.  Skin:    General: Skin is warm and dry.  Neurological:     Mental Status: She is alert and oriented to person, place, and time.         Latest Ref Rng & Units 03/10/2023    9:19 AM  CMP  Glucose 70 - 99 mg/dL 97  BUN 6 - 20 mg/dL 15   Creatinine 4.09 - 1.00 mg/dL 8.11   Sodium 914 - 782 mmol/L 138   Potassium 3.5 - 5.1 mmol/L 3.6   Chloride 98 - 111 mmol/L 106   CO2 22 - 32 mmol/L 24   Calcium 8.9 - 10.3 mg/dL 8.4   Total Protein  6.5 - 8.1 g/dL 6.8   Total Bilirubin 0.3 - 1.2 mg/dL 0.3   Alkaline Phos 38 - 126 U/L 184   AST 15 - 41 U/L 19   ALT 0 - 44 U/L 17       Latest Ref Rng & Units 03/10/2023    9:19 AM  CBC  WBC 4.0 - 10.5 K/uL 9.5   Hemoglobin 12.0 - 15.0 g/dL 95.6   Hematocrit 21.3 - 46.0 % 31.5   Platelets 150 - 400 K/uL 236     DG Chest 2 View  Result Date: 03/01/2023 CLINICAL DATA:  wheezing EXAM: CHEST - 2 VIEW COMPARISON:  July 01, 2020 December 15, 2022, Feb 10, 2023 FINDINGS: The cardiomediastinal silhouette is unchanged in contour with widening of the RIGHT paratracheal stripe.RIGHT chest port with tip terminating over the superior cavoatrial junction. Tortuous thoracic aorta. There is external compression on the RIGHT side of the trachea with corresponding mild degree of narrowing; this is favored similar in comparison to more recent CTs. No pleural effusion. No pneumothorax. No acute pleuroparenchymal abnormality. Visualized abdomen is unremarkable. Remote LEFT-sided rib fracture. Revisualization of osseous metastatic disease with multilevel sclerotic thoracic vertebral bodies, several ribs and the LEFT humerus. IMPRESSION: 1. Favored overall similar appearance of widening of the RIGHT paratracheal stripe with external compression on the RIGHT side of the trachea with corresponding mild narrowing. This is consistent with known malignancy. Electronically Signed   By: Meda Klinefelter M.D.   On: 03/01/2023 16:50   [place] RT UPPER EXTREM LTD SOFT TISSUE NON VASCULAR  Result Date: 02/21/2023 CLINICAL DATA:  Palpable abnormality in the right supraclavicular region. History of metastatic lung cancer EXAM: ULTRASOUND RIGHT UPPER EXTREMITY LIMITED TECHNIQUE: Ultrasound examination of the upper extremity soft tissues was performed in the area of clinical concern. COMPARISON:  CT 09/16/2022 FINDINGS: Targeted ultrasound was performed of the soft tissues of the right supraclavicular region at site of patient's  clinical concern. Within the superficial subcutaneous soft tissues is a irregularly marginated hypoechoic soft tissue nodule measuring 1.0 x 0.7 x 0.7 cm. There is internal vascularity. No surrounding soft tissue edema. IMPRESSION: Irregular 1.0 cm soft tissue nodule within the right supraclavicular region. Given history of metastatic lung cancer, this is most compatible with nodal or soft tissue metastasis. Electronically Signed   By: Duanne Guess D.O.   On: 02/21/2023 08:30   [place] LT UPPER EXTREM LTD SOFT TISSUE NON VASCULAR  Result Date: 02/21/2023 CLINICAL DATA:  Palpable abnormality in the left axillary region. History of metastatic lung cancer EXAM: ULTRASOUND LEFT UPPER EXTREMITY LIMITED TECHNIQUE: Ultrasound examination of the upper extremity soft tissues was performed in the area of clinical concern. COMPARISON:  CT 02/03/2023 FINDINGS: Targeted ultrasound of the soft tissues of the left axilla was performed at site of patient's palpable abnormality. Within the superficial subcutaneous soft tissues at this location is and irregularly marginated hypoechoic mass with internal vascularity measuring 1.7 x 1.3 x 1.5 cm. There are a few tiny foci of calcification within this mass. No surrounding soft tissue edema. IMPRESSION: Irregular 1.7 cm soft tissue mass with internal vascularity within the superficial subcutaneous soft tissues of  the left axilla at site of patient's palpable abnormality. Given history of metastatic lung cancer, this is most compatible with nodal or soft tissue metastasis. Electronically Signed   By: Duanne Guess D.O.   On: 02/21/2023 08:27   NM Bone Scan Whole Body  Result Date: 02/14/2023 CLINICAL DATA:  Restaging metastatic lung cancer. EXAM: NUCLEAR MEDICINE WHOLE BODY BONE SCAN TECHNIQUE: Whole body anterior and posterior images were obtained approximately 3 hours after intravenous injection of radiopharmaceutical. RADIOPHARMACEUTICALS:  20.21 mCi Technetium-47m MDP IV  COMPARISON:  12/08/2022 FINDINGS: Again seen is multifocal abnormal areas of increased uptake within the axial and proximal appendicular skeleton and calvarium compatible with widespread osseous metastasis. Lesions are noted within the calvarium, sternum, bilateral ribs, bilateral scapula, thoracic and lumbar spine, bilateral humeri, bilateral femoral, and pelvis. Compared with the previous exam there is been progressive uptake at the approximate T6 and T7 vertebra. Additionally, there is been mild increase in size of the left frontal bone. Normal physiologic tracer activity is identified within the kidneys an urinary bladder. IMPRESSION: 1. Interval increase in tracer uptake associated with previous lesions within the left frontal bone and approximate T6 and T7 vertebra. 2. The remaining lesions appear similar in size and multiplicity. Electronically Signed   By: Signa Kell M.D.   On: 02/14/2023 09:19   DG Lumbar Spine 2-3 Views  Result Date: 02/10/2023 CLINICAL DATA:  Back pain history of cancer EXAM: LUMBAR SPINE - 2-3 VIEW COMPARISON:  CT 02/03/2023 FINDINGS: Grade 1 anterolisthesis L3 on L4 and trace anterolisthesis L2 on L3. Sclerosis within multiple vertebral bodies as well as the pelvis consistent with metastatic disease. Vertebral body heights are maintained. Mild disc space narrowing and degenerative change at L4-L5 and L5-S1. Facet degenerative changes of the mid to lower lumbar spine. IMPRESSION: 1. Mild degenerative changes of the lumbar spine with grade 1 anterolisthesis L3 on L4 and trace anterolisthesis L2 on L3. 2. Sclerosis within multiple vertebral bodies and pelvis consistent with metastatic disease. Electronically Signed   By: Jasmine Pang M.D.   On: 02/10/2023 22:06   CT Angio Chest PE W and/or Wo Contrast  Result Date: 02/10/2023 CLINICAL DATA:  Pulmonary embolism (PE) suspected, high prob EXAM: CT ANGIOGRAPHY CHEST WITH CONTRAST TECHNIQUE: Multidetector CT imaging of the chest  was performed using the standard protocol during bolus administration of intravenous contrast. Multiplanar CT image reconstructions and MIPs were obtained to evaluate the vascular anatomy. RADIATION DOSE REDUCTION: This exam was performed according to the departmental dose-optimization program which includes automated exposure control, adjustment of the mA and/or kV according to patient size and/or use of iterative reconstruction technique. CONTRAST:  75mL OMNIPAQUE IOHEXOL 350 MG/ML SOLN COMPARISON:  Chest XR, 07/01/2020.  CT CAP, 02/03/2023. FINDINGS: Cardiovascular: Satisfactory opacification of the pulmonary arteries to the segmental level. No segmental or larger pulmonary embolus. Normal heart size. No pericardial effusion. Mediastinum/Nodes: Similar degree of RIGHT paratracheal and BILATERAL hilar adenopathy, better appreciated and described on recent comparison CT CAP. Enlarged LEFT axillary lymph nodes. RIGHT jugular-approach chest port with catheter tip well-positioned at the superior cavoatrial junction. Thyroid gland, trachea, and esophagus demonstrate no significant findings. Lungs/Pleura: RIGHT perihilar and peribronchial thickening, unchanged. Mild linear bibasilar atelectasis. No new consolidation, mass or suspicious pulmonary nodule. No pleural effusion or pneumothorax. Upper Abdomen: No acute abnormality. Small 1.4 cm hypodensity of the hepatic dome, incompletely assessed. Musculoskeletal: New fat stranding about the RIGHT thoracic inlet and axilla, incompletely assessed. Multifocal osseous metastases involving the ribs and imaged thoracic spine.  Review of the MIP images confirms the above findings. IMPRESSION: 1. No segmental or larger pulmonary embolus. No acute intrathoracic abnormality. 2. New fat stranding about the RIGHT thoracic inlet and axilla, incompletely assessed. Clinical correlation is recommended. 3. Similar malignant findings within chest, and including multifocal osseous metastases.  Electronically Signed   By: Roanna Banning M.D.   On: 02/10/2023 16:58     Assessment and plan- Patient is a 57 y.o. female with metastatic adenocarcinoma of the lung here to discuss further management  Patient was diagnosed with stage IV adenocarcinoma of the lung in January 2022.  She was initially treated with 4 cycles of carbo Alimta Keytruda chemotherapy followed by maintenance Alimta and Keytruda.  Treatment was complicated by autoimmune colitis requiring stoppage of Keytruda.  She then had disease progression on Alimta and was switched to second line sotorasib given that she had K-ras G12 C mutation.  Had disease progression in May 2024.  Will switch to third line of treatment with [place], Taxol and Avastin.  Seen today prior to cycle 2 of the treatment.  Labs reviewed and acceptable for the treatment.  Plan for supraclavicular lymph node biopsy on July 2 for sample for NGS testing.  Neoplasm related pain:Continue as needed Dilaudid.  On fentanyl patch 25 mcg every 72 hours.  Refill today.  Lack of appetite: Marinol not approved by insurance.  She is followed by palliative care.  Prescription was sent for olanzapine which she will pick up today.    RTC scheduled in 3 weeks to see Dr. Smith Robert, labs, cycle 3 of CarboTaxol and Avastin.  Visit Diagnosis 1. Primary malignant neoplasm of right lung metastatic to other site [facility])       11:52 AM

## 2023-03-10 NOTE — Patient Instructions (Signed)
[place] [facility]  Discharge Instructions: Thank you for choosing [facility] to provide your oncology and hematology care.  If you have a lab appointment with the Cancer Center, please go directly to the Cancer Center and check in at the registration area.  Wear comfortable clothing and clothing appropriate for easy access to any Portacath or PICC line.   We strive to give you quality time with your provider. You may need to reschedule your appointment if you arrive late (15 or more minutes).  Arriving late affects you and other patients whose appointments are after yours.  Also, if you miss three or more appointments without notifying the office, you may be dismissed from the clinic at the provider's discretion.      For prescription refill requests, have your pharmacy contact our office and allow 72 hours for refills to be completed.    Today you received the following chemotherapy and/or immunotherapy agents bevacizumab, paclitaxel, and carboplatin      To help prevent nausea and vomiting after your treatment, we encourage you to take your nausea medication as directed.  BELOW ARE SYMPTOMS THAT SHOULD BE REPORTED IMMEDIATELY: *FEVER GREATER THAN 100.4 F (38 C) OR HIGHER *CHILLS OR SWEATING *NAUSEA AND VOMITING THAT IS NOT CONTROLLED WITH YOUR NAUSEA MEDICATION *UNUSUAL SHORTNESS OF BREATH *UNUSUAL BRUISING OR BLEEDING *URINARY PROBLEMS (pain or burning when urinating, or frequent urination) *BOWEL PROBLEMS (unusual diarrhea, constipation, pain near the anus) TENDERNESS IN MOUTH AND THROAT WITH OR WITHOUT PRESENCE OF ULCERS (sore throat, sores in mouth, or a toothache) UNUSUAL RASH, SWELLING OR PAIN  UNUSUAL VAGINAL DISCHARGE OR ITCHING   Items with * indicate a potential emergency and should be followed up as soon as possible or go to the Emergency Department if any problems should occur.  Please show the CHEMOTHERAPY ALERT CARD or  IMMUNOTHERAPY ALERT CARD at check-in to the Emergency Department and triage nurse.  Should you have questions after your visit or need to cancel or reschedule your appointment, please contact [place] [facility]  [phone] and follow the prompts.  Office hours are 8:00 a.m. to 4:30 p.m. Monday - Friday. Please note that voicemails left after 4:00 p.m. may not be returned until the following business day.  We are closed weekends and major holidays. You have access to a nurse at all times for urgent questions. Please call the main number to the clinic [phone] and follow the prompts.  For any non-urgent questions, you may also contact your provider using MyChart. We now offer e-Visits for anyone 67 and older to request care online for non-urgent symptoms. For details visit [email or web address].   Also download the MyChart app! Go to the app store, search "MyChart", open the app, select [place], and log in with your MyChart username and password.

## 2023-03-11 ENCOUNTER — Inpatient Hospital Stay: Payer: 59

## 2023-03-11 DIAGNOSIS — K219 Gastro-esophageal reflux disease without esophagitis: Secondary | ICD-10-CM | POA: Diagnosis not present

## 2023-03-11 DIAGNOSIS — I2510 Atherosclerotic heart disease of native coronary artery without angina pectoris: Secondary | ICD-10-CM | POA: Diagnosis not present

## 2023-03-11 DIAGNOSIS — I700 Atherosclerosis of aorta: Secondary | ICD-10-CM | POA: Diagnosis not present

## 2023-03-11 DIAGNOSIS — Z5189 Encounter for other specified aftercare: Secondary | ICD-10-CM | POA: Diagnosis not present

## 2023-03-11 DIAGNOSIS — E2740 Unspecified adrenocortical insufficiency: Secondary | ICD-10-CM | POA: Diagnosis not present

## 2023-03-11 DIAGNOSIS — G893 Neoplasm related pain (acute) (chronic): Secondary | ICD-10-CM | POA: Diagnosis not present

## 2023-03-11 DIAGNOSIS — M47816 Spondylosis without myelopathy or radiculopathy, lumbar region: Secondary | ICD-10-CM | POA: Diagnosis not present

## 2023-03-11 DIAGNOSIS — K449 Diaphragmatic hernia without obstruction or gangrene: Secondary | ICD-10-CM | POA: Diagnosis not present

## 2023-03-11 DIAGNOSIS — M4316 Spondylolisthesis, lumbar region: Secondary | ICD-10-CM | POA: Diagnosis not present

## 2023-03-11 DIAGNOSIS — Z87891 Personal history of nicotine dependence: Secondary | ICD-10-CM | POA: Diagnosis not present

## 2023-03-11 DIAGNOSIS — C3491 Malignant neoplasm of unspecified part of right bronchus or lung: Secondary | ICD-10-CM | POA: Diagnosis not present

## 2023-03-11 DIAGNOSIS — C786 Secondary malignant neoplasm of retroperitoneum and peritoneum: Secondary | ICD-10-CM | POA: Diagnosis not present

## 2023-03-11 DIAGNOSIS — C7951 Secondary malignant neoplasm of bone: Secondary | ICD-10-CM | POA: Diagnosis not present

## 2023-03-11 DIAGNOSIS — R229 Localized swelling, mass and lump, unspecified: Secondary | ICD-10-CM | POA: Diagnosis not present

## 2023-03-11 DIAGNOSIS — I10 Essential (primary) hypertension: Secondary | ICD-10-CM | POA: Diagnosis not present

## 2023-03-11 DIAGNOSIS — Z79899 Other long term (current) drug therapy: Secondary | ICD-10-CM | POA: Diagnosis not present

## 2023-03-11 DIAGNOSIS — Z9221 Personal history of antineoplastic chemotherapy: Secondary | ICD-10-CM | POA: Diagnosis not present

## 2023-03-11 DIAGNOSIS — C7970 Secondary malignant neoplasm of unspecified adrenal gland: Secondary | ICD-10-CM | POA: Diagnosis not present

## 2023-03-11 DIAGNOSIS — C778 Secondary and unspecified malignant neoplasm of lymph nodes of multiple regions: Secondary | ICD-10-CM | POA: Diagnosis not present

## 2023-03-11 DIAGNOSIS — Z5111 Encounter for antineoplastic chemotherapy: Secondary | ICD-10-CM | POA: Diagnosis not present

## 2023-03-11 MED ORDER — PEGFILGRASTIM-JMDB 6 MG/0.6ML ~~LOC~~ SOSY
6.0000 mg | PREFILLED_SYRINGE | Freq: Once | SUBCUTANEOUS | Status: AC
  Administered 2023-03-11: 6 mg via SUBCUTANEOUS
  Filled 2023-03-11: qty 0.6

## 2023-03-14 NOTE — Progress Notes (Signed)
Patient for US guided Core RT Supraclavicular or LT Axillary LN Biopsy on Tues 03/15/2023, I called and LVM for the patient on the phone and gave pre-procedure instructions. VM made the pt aware to be here at 12:30p and check in at the [facility] registration desk. Called 03/14/2023

## 2023-03-15 ENCOUNTER — Other Ambulatory Visit: Payer: Self-pay | Admitting: Oncology

## 2023-03-15 ENCOUNTER — Ambulatory Visit
Admission: RE | Admit: 2023-03-15 | Discharge: 2023-03-15 | Disposition: A | Discharge status: Home / Self Care | Payer: 59 | Source: Ambulatory Visit | Attending: Oncology | Admitting: Oncology

## 2023-03-15 DIAGNOSIS — C3491 Malignant neoplasm of unspecified part of right bronchus or lung: Secondary | ICD-10-CM

## 2023-03-15 DIAGNOSIS — R9389 Abnormal findings on diagnostic imaging of other specified body structures: Secondary | ICD-10-CM

## 2023-03-15 DIAGNOSIS — R590 Localized enlarged lymph nodes: Secondary | ICD-10-CM | POA: Diagnosis not present

## 2023-03-24 ENCOUNTER — Inpatient Hospital Stay: Payer: 59 | Attending: Oncology | Admitting: Hospice and Palliative Medicine

## 2023-03-24 DIAGNOSIS — Z5111 Encounter for antineoplastic chemotherapy: Secondary | ICD-10-CM | POA: Insufficient documentation

## 2023-03-24 DIAGNOSIS — Z5189 Encounter for other specified aftercare: Secondary | ICD-10-CM | POA: Insufficient documentation

## 2023-03-24 DIAGNOSIS — G893 Neoplasm related pain (acute) (chronic): Secondary | ICD-10-CM | POA: Insufficient documentation

## 2023-03-24 DIAGNOSIS — C770 Secondary and unspecified malignant neoplasm of lymph nodes of head, face and neck: Secondary | ICD-10-CM | POA: Insufficient documentation

## 2023-03-24 DIAGNOSIS — C3491 Malignant neoplasm of unspecified part of right bronchus or lung: Secondary | ICD-10-CM

## 2023-03-24 DIAGNOSIS — C7951 Secondary malignant neoplasm of bone: Secondary | ICD-10-CM | POA: Insufficient documentation

## 2023-03-24 DIAGNOSIS — Z87891 Personal history of nicotine dependence: Secondary | ICD-10-CM | POA: Insufficient documentation

## 2023-03-24 MED ORDER — FENTANYL 25 MCG/HR TD PT72: 1.0000 | MEDICATED_PATCH | TRANSDERMAL | 0 refills | Status: DC

## 2023-03-24 NOTE — Progress Notes (Signed)
Virtual Visit via Telephone Note  I connected with Gina Sanchez on 03/24/23 at  3:00 PM EDT by telephone and verified that I am speaking with the correct person using two identifiers.  Location: Patient: Home Provider: Clinic   I discussed the limitations, risks, security and privacy concerns of performing an evaluation and management service by telephone and the availability of in person appointments. I also discussed with the patient that there may be a patient responsible charge related to this service. The patient expressed understanding and agreed to proceed.   History of Present Illness: Gina Sanchez is a 57 y.o. 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      Observations/Objective: I called and spoke with patient by phone.  She reports that she has good days and bad but overall feels much improved.  She says that she is sleeping much better on olanzapine and nausea and appetite have also improved.  Pain is stable.  Patient says she is not requiring much hydromorphone for breakthrough pain.  She does request refill of fentanyl patches.  Patient says that she stopped taking gabapentin as she did not like the way it made her feel.  She feels like neuropathic symptoms are stable.  Assessment and Plan: Neoplasm related pain -continue fentanyl/hydromorphone.  Refill fentanyl.  Depression -stable on citalopram  Nausea/insomnia -improved on olanzapine  Follow Up Instructions: Follow-up telephone visit 1 month   I discussed the assessment and treatment plan with the patient. The patient was provided an opportunity to ask questions and all were answered. The patient agreed with the plan and demonstrated an understanding of the instructions.   The patient was advised to call back or seek an in-person evaluation if the symptoms worsen or if the condition fails to improve as anticipated.  I  provided 5 minutes of non-face-to-face time during this encounter.   Malachy Moan, NP

## 2023-03-30 ENCOUNTER — Inpatient Hospital Stay: Payer: 59

## 2023-03-30 ENCOUNTER — Inpatient Hospital Stay: Payer: 59 | Admitting: Oncology

## 2023-03-30 ENCOUNTER — Encounter: Payer: Self-pay | Admitting: Oncology

## 2023-03-30 VITALS — BP 112/87 | HR 117 | Temp 97.5°F | Resp 18 | Ht 66.0 in | Wt 171.1 lb

## 2023-03-30 VITALS — BP 125/83 | HR 90 | Resp 18

## 2023-03-30 DIAGNOSIS — Z5189 Encounter for other specified aftercare: Secondary | ICD-10-CM | POA: Diagnosis not present

## 2023-03-30 DIAGNOSIS — G893 Neoplasm related pain (acute) (chronic): Secondary | ICD-10-CM | POA: Diagnosis not present

## 2023-03-30 DIAGNOSIS — C3491 Malignant neoplasm of unspecified part of right bronchus or lung: Secondary | ICD-10-CM

## 2023-03-30 DIAGNOSIS — Z5111 Encounter for antineoplastic chemotherapy: Secondary | ICD-10-CM

## 2023-03-30 DIAGNOSIS — Z87891 Personal history of nicotine dependence: Secondary | ICD-10-CM | POA: Diagnosis not present

## 2023-03-30 DIAGNOSIS — Z5112 Encounter for antineoplastic immunotherapy: Secondary | ICD-10-CM

## 2023-03-30 DIAGNOSIS — C770 Secondary and unspecified malignant neoplasm of lymph nodes of head, face and neck: Secondary | ICD-10-CM | POA: Diagnosis not present

## 2023-03-30 DIAGNOSIS — C7951 Secondary malignant neoplasm of bone: Secondary | ICD-10-CM | POA: Diagnosis not present

## 2023-03-30 LAB — CBC WITH DIFFERENTIAL (CANCER CENTER ONLY)
Abs Immature Granulocytes: 0.09 10*3/uL — ABNORMAL HIGH (ref 0.00–0.07)
Basophils Absolute: 0 10*3/uL (ref 0.0–0.1)
Basophils Relative: 1 %
Eosinophils Absolute: 0.1 10*3/uL (ref 0.0–0.5)
Eosinophils Relative: 1 %
HCT: 31.3 % — ABNORMAL LOW (ref 36.0–46.0)
Hemoglobin: 9.7 g/dL — ABNORMAL LOW (ref 12.0–15.0)
Immature Granulocytes: 1 %
Lymphocytes Relative: 19 %
Lymphs Abs: 1.6 10*3/uL (ref 0.7–4.0)
MCH: 26.6 pg (ref 26.0–34.0)
MCHC: 31 g/dL (ref 30.0–36.0)
MCV: 85.8 fL (ref 80.0–100.0)
Monocytes Absolute: 0.7 10*3/uL (ref 0.1–1.0)
Monocytes Relative: 8 %
Neutro Abs: 5.8 10*3/uL (ref 1.7–7.7)
Neutrophils Relative %: 70 %
Platelet Count: 319 10*3/uL (ref 150–400)
RBC: 3.65 MIL/uL — ABNORMAL LOW (ref 3.87–5.11)
RDW: 23 % — ABNORMAL HIGH (ref 11.5–15.5)
WBC Count: 8.2 10*3/uL (ref 4.0–10.5)
nRBC: 0 % (ref 0.0–0.2)

## 2023-03-30 LAB — FERRITIN: Ferritin: 137 ng/mL (ref 11–307)

## 2023-03-30 LAB — CMP (CANCER CENTER ONLY)
ALT: 17 U/L (ref 0–44)
AST: 19 U/L (ref 15–41)
Albumin: 3.2 g/dL — ABNORMAL LOW (ref 3.5–5.0)
Alkaline Phosphatase: 203 U/L — ABNORMAL HIGH (ref 38–126)
Anion gap: 9 (ref 5–15)
BUN: 14 mg/dL (ref 6–20)
CO2: 23 mmol/L (ref 22–32)
Calcium: 7.8 mg/dL — ABNORMAL LOW (ref 8.9–10.3)
Chloride: 104 mmol/L (ref 98–111)
Creatinine: 0.53 mg/dL (ref 0.44–1.00)
GFR, Estimated: >60 mL/min (ref >60)
Glucose, Bld: 87 mg/dL (ref 70–99)
Potassium: 3.7 mmol/L (ref 3.5–5.1)
Sodium: 136 mmol/L (ref 135–145)
Total Bilirubin: 0.3 mg/dL (ref 0.3–1.2)
Total Protein: 6.9 g/dL (ref 6.5–8.1)

## 2023-03-30 LAB — FOLATE: Folate: 12.8 ng/mL (ref >5.9)

## 2023-03-30 LAB — IRON AND TIBC
Iron: 75 ug/dL (ref 28–170)
Saturation Ratios: 23 % (ref 10.4–31.8)
TIBC: 328 ug/dL (ref 250–450)
UIBC: 253 ug/dL

## 2023-03-30 LAB — VITAMIN B12: Vitamin B-12: 3753 pg/mL — ABNORMAL HIGH (ref 180–914)

## 2023-03-30 MED ORDER — FAMOTIDINE IN NACL 20-0.9 MG/50ML-% IV SOLN
20.0000 mg | Freq: Once | INTRAVENOUS | Status: AC
  Administered 2023-03-30: 20 mg via INTRAVENOUS
  Filled 2023-03-30: qty 50

## 2023-03-30 MED ORDER — HEPARIN SOD (PORK) LOCK FLUSH 100 UNIT/ML IV SOLN
500.0000 [IU] | Freq: Once | INTRAVENOUS | Status: AC | PRN
  Administered 2023-03-30: 500 [IU]
  Filled 2023-03-30: qty 5

## 2023-03-30 MED ORDER — SODIUM CHLORIDE 0.9 % IV SOLN
175.0000 mg/m2 | Freq: Once | INTRAVENOUS | Status: AC
  Administered 2023-03-30: 336 mg via INTRAVENOUS
  Filled 2023-03-30: qty 56

## 2023-03-30 MED ORDER — PALONOSETRON HCL INJECTION 0.25 MG/5ML
0.2500 mg | Freq: Once | INTRAVENOUS | Status: AC
  Administered 2023-03-30: 0.25 mg via INTRAVENOUS
  Filled 2023-03-30: qty 5

## 2023-03-30 MED ORDER — SODIUM CHLORIDE 0.9 % IV SOLN
10.0000 mg | Freq: Once | INTRAVENOUS | Status: AC
  Administered 2023-03-30: 10 mg via INTRAVENOUS
  Filled 2023-03-30: qty 10

## 2023-03-30 MED ORDER — SODIUM CHLORIDE 0.9 % IV SOLN
Freq: Once | INTRAVENOUS | Status: AC
  Filled 2023-03-30: qty 250

## 2023-03-30 MED ORDER — SODIUM CHLORIDE 0.9 % IV SOLN
15.0000 mg/kg | Freq: Once | INTRAVENOUS | Status: AC
  Administered 2023-03-30: 1200 mg via INTRAVENOUS
  Filled 2023-03-30: qty 48

## 2023-03-30 MED ORDER — DIPHENHYDRAMINE HCL 50 MG/ML IJ SOLN
50.0000 mg | Freq: Once | INTRAMUSCULAR | Status: AC
  Administered 2023-03-30: 50 mg via INTRAVENOUS
  Filled 2023-03-30: qty 1

## 2023-03-30 MED ORDER — SODIUM CHLORIDE 0.9 % IV SOLN
616.0000 mg | Freq: Once | INTRAVENOUS | Status: AC
  Administered 2023-03-30: 620 mg via INTRAVENOUS
  Filled 2023-03-30: qty 62

## 2023-03-30 MED ORDER — SODIUM CHLORIDE 0.9 % IV SOLN
150.0000 mg | Freq: Once | INTRAVENOUS | Status: AC
  Administered 2023-03-30: 150 mg via INTRAVENOUS
  Filled 2023-03-30: qty 150

## 2023-03-30 NOTE — Patient Instructions (Signed)
[place] [facility]  Discharge Instructions: Thank you for choosing [facility] to provide your oncology and hematology care.  If you have a lab appointment with the Cancer Center, please go directly to the Cancer Center and check in at the registration area.  Wear comfortable clothing and clothing appropriate for easy access to any Portacath or PICC line.   We strive to give you quality time with your provider. You may need to reschedule your appointment if you arrive late (15 or more minutes).  Arriving late affects you and other patients whose appointments are after yours.  Also, if you miss three or more appointments without notifying the office, you may be dismissed from the clinic at the provider's discretion.      For prescription refill requests, have your pharmacy contact our office and allow 72 hours for refills to be completed.    Today you received the following chemotherapy and/or immunotherapy agents Paclitaxel, Carboplatin, and MVASI.      To help prevent nausea and vomiting after your treatment, we encourage you to take your nausea medication as directed.  BELOW ARE SYMPTOMS THAT SHOULD BE REPORTED IMMEDIATELY: *FEVER GREATER THAN 100.4 F (38 C) OR HIGHER *CHILLS OR SWEATING *NAUSEA AND VOMITING THAT IS NOT CONTROLLED WITH YOUR NAUSEA MEDICATION *UNUSUAL SHORTNESS OF BREATH *UNUSUAL BRUISING OR BLEEDING *URINARY PROBLEMS (pain or burning when urinating, or frequent urination) *BOWEL PROBLEMS (unusual diarrhea, constipation, pain near the anus) TENDERNESS IN MOUTH AND THROAT WITH OR WITHOUT PRESENCE OF ULCERS (sore throat, sores in mouth, or a toothache) UNUSUAL RASH, SWELLING OR PAIN  UNUSUAL VAGINAL DISCHARGE OR ITCHING   Items with * indicate a potential emergency and should be followed up as soon as possible or go to the Emergency Department if any problems should occur.  Please show the CHEMOTHERAPY ALERT CARD or IMMUNOTHERAPY  ALERT CARD at check-in to the Emergency Department and triage nurse.  Should you have questions after your visit or need to cancel or reschedule your appointment, please contact [place] [facility]  [phone] and follow the prompts.  Office hours are 8:00 a.m. to 4:30 p.m. Monday - Friday. Please note that voicemails left after 4:00 p.m. may not be returned until the following business day.  We are closed weekends and major holidays. You have access to a nurse at all times for urgent questions. Please call the main number to the clinic [phone] and follow the prompts.  For any non-urgent questions, you may also contact your provider using MyChart. We now offer e-Visits for anyone 31 and older to request care online for non-urgent symptoms. For details visit [email or web address].   Also download the MyChart app! Go to the app store, search "MyChart", open the app, select [place], and log in with your MyChart username and password.

## 2023-03-30 NOTE — Progress Notes (Signed)
Nutrition Follow-up:  Patient with stage IV adenocarcinoma of lung with bone, lymph node and adrenal involvement.  Patient receiving chemotherapy.   Met with patient during infusion.  Reports that her appetite is much better.  Zyprexa held her sleep, nausea and appetite.  Has been eating tomato sandwiches, fruit, vegetables, pasta, steak, chicken, eggs, milk.  Has not been drinking shakes due to increase in appetite.    Medications: reviewed  Labs: reviewed  Anthropometrics:   Weight 171 lb 1.6 oz on 7/17 166 lb on 6/27 UBW of 192 lb   NUTRITION DIAGNOSIS: Inadequate oral intake improved   INTERVENTION:  Agree with holding off on oral nutrition supplement with increased appetite and weight gain Encouraged well balanced diet including good sources of protein    MONITORING, EVALUATION, GOAL: weight trends, intake   NEXT VISIT: as needed  Kirby Cortese B. Freida Busman, RD, LDN Registered Dietitian [phone]

## 2023-03-30 NOTE — Progress Notes (Signed)
Hematology/Oncology Consult note [facility]  [phone] [phone]  Patient Care Team: Armando Gang, FNP as PCP - General (Family Medicine) Glory Buff, RN as Oncology Nurse Navigator Carmina Miller, MD as Radiation Oncologist (Radiation Oncology) Vida Rigger, MD as Consulting Physician (Pulmonary Disease)   Name of the patient: Gina Sanchez  [id number]  Jul 19, 1966   Date of visit: 03/30/23  Diagnosis-stage IV adenocarcinoma of the lung  Chief complaint/ Reason for visit-on treatment assessment prior to cycle 3 of CarboTaxol Avastin chemotherapy  Heme/Onc history:  Patient is a 57 year old female with a past medical history significant for hypertension hyperlipidemia and anxiety who presented with right thigh pain and was found to have an acute right proximal femoral shaft fracture.  She underwent operative fixation on 10/12/2020.  MRI of femur showed heterogeneously enhancing osseous lesion within the area which would be nonspecific versus office neoplasm or metastatic lesion.  CT chest abdomen and pelvis with contrast showed an enlarged pretracheal lymph node 2.2 x 1.5 cm and a right paratracheal lymph node measuring 2.7 x 1.3 cm.  Prevascular node measuring 0.3 x 0.9 cm.  5 x 4 mm right middle lobe nodule.  2.8 x 1.9 cm left adrenal lesion.    Reamings from the right femur showed metastatic poorly differentiated carcinoma.  Immunohistochemistry showed was positive for pancytokeratin, CK7 and patchy CK20 with patchy dim expression of TTF-1.  Cells negative for Melan-A, CDX2, PAX8, Napsin A, GATA3, p40, CD56, p16 and thyroglobulin.  Findings compatible with metastatic carcinoma but because of decalcification immunohistochemical staining is unreliable.  Patchy dim staining with TTF-1 suspicious for lung primary but not a definitive diagnosis.   Repeat supraclavicular lymph node biopsy showed metastatic adenocarcinoma.  Tumor cells  positive for CK7 with focal weak staining for TTF-1.  Suggestive of lung origin in the proper clinical context.  Cells were negative for GATA3 PAX8 CDX2 and CK20 and Napsin A.  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  She is currently on hydrocortisone twice daily.  Thyroid functions presently are normal     Patient was treated with Ledell Noss Alimta Keytruda chemotherapy first-line followed by maintenance Alimta and Keytruda.  She developed autoimmune colitis secondary to [facility] and therefore that was stopped.  She is on Entyvio and follows up with GI for her autoimmune colitis.  Most recent regimen was maintenance Alimta. Disease progression in Jan 2024. Patients started on sotorasib.  Presently on full dose 960 mg daily.  Disease progression in May 2024.  Patient switched to CarboTaxol Avastin in June 2024    Interval history-patient feels a whole lot better after starting CarboTaxol Avastin chemotherapy.  Subcutaneous nodules have resolved.  Appetite is better after starting Zyprexa.  She has put on more weight and pain is much more tolerable.  She does not use as needed oxycodone anymore and is only using her fentanyl patches.  She is going to try a lower dose of fentanyl patch and see if that will suffice.  Bowel movements are presently regular  ECOG PS- 1 Pain scale- 2   Review of systems- Review of Systems  Constitutional:  Positive for malaise/fatigue. Negative for chills, fever and weight loss.  HENT:  Negative for congestion, ear discharge and nosebleeds.   Eyes:  Negative for blurred vision.  Respiratory:  Negative for cough, hemoptysis, sputum production,  shortness of breath and wheezing.   Cardiovascular:  Negative for chest pain, palpitations, orthopnea and claudication.  Gastrointestinal:  Negative for abdominal pain, blood in stool,  constipation, diarrhea, heartburn, melena, nausea and vomiting.  Genitourinary:  Negative for dysuria, flank pain, frequency, hematuria and urgency.  Musculoskeletal:  Negative for back pain, joint pain and myalgias.  Skin:  Negative for rash.  Neurological:  Negative for dizziness, tingling, focal weakness, seizures, weakness and headaches.  Endo/Heme/Allergies:  Does not bruise/bleed easily.  Psychiatric/Behavioral:  Negative for depression and suicidal ideas. The patient does not have insomnia.       Allergies  Allergen Reactions   Factive [Gemifloxacin] Rash     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   Palpitations    Precordial chest pain    Wrist fracture 03/2021   left     Past Surgical History:  Procedure Laterality Date   BREAST BIOPSY Right 2017   benign   CERVICAL BIOPSY  W/ LOOP ELECTRODE EXCISION     COLONOSCOPY  06/11/2022   Procedure: COLONOSCOPY;  Surgeon: Toney Reil, MD;  Location: [facility] ENDOSCOPY;  Service: Gastroenterology;;   COLONOSCOPY WITH PROPOFOL N/A 07/03/2015   Procedure: COLONOSCOPY WITH PROPOFOL;  Surgeon: Wallace Cullens, MD;  Location[facility] ENDOSCOPY;  Service: Gastroenterology;  Laterality: N/A;   COLONOSCOPY WITH PROPOFOL N/A 01/06/2022   Procedure: COLONOSCOPY WITH PROPOFOL;  Surgeon: Toney Reil, MD;  Location: [facility] ENDOSCOPY;  Service: Gastroenterology;  Laterality: N/A;   ESOPHAGOGASTRODUODENOSCOPY (EGD) WITH PROPOFOL N/A 07/03/2015   Procedure: ESOPHAGOGASTRODUODENOSCOPY (EGD) WITH PROPOFOL;  Surgeon: Wallace Cullens, MD;  Location: [facility] ENDOSCOPY;  Service: Gastroenterology;  Laterality: N/A;   FLEXIBLE SIGMOIDOSCOPY N/A 03/12/2022   Procedure: FLEXIBLE SIGMOIDOSCOPY;  Surgeon: Toney Reil,  MD;  Location: [facility] ENDOSCOPY;  Service: Gastroenterology;  Laterality: N/A;   FLEXIBLE SIGMOIDOSCOPY N/A 08/17/2022   Procedure: FLEXIBLE SIGMOIDOSCOPY;  Surgeon: Toney Reil, MD;  Location: [facility] ENDOSCOPY;  Service: Gastroenterology;  Laterality: N/A;   FRACTURE SURGERY     INTRAMEDULLARY (IM) NAIL INTERTROCHANTERIC Right 09/15/2020   Procedure: INTRAMEDULLARY (IM) NAIL INTERTROCHANTRIC;  Surgeon: Christena Flake, MD;  Location: [facility] ORS;  Service: Orthopedics;  Laterality: Right;   IR CV LINE INJECTION  07/14/2022   IR IMAGING GUIDED PORT INSERTION  11/28/2020   LEFT HEART CATH AND CORONARY ANGIOGRAPHY N/A 03/28/2017   Procedure: Left Heart Cath and Coronary Angiography;  Surgeon: Runell Gess, MD;  Location: [facility] INVASIVE CV LAB;  Service: Cardiovascular;  Laterality: N/A;   ORIF WRIST FRACTURE Left 03/31/2021   Procedure: OPEN REDUCTION INTERNAL FIXATION (ORIF) LEFT DISTAL RADIUS FRACTURE.;  Surgeon: Christena Flake, MD;  Location: [facility] ORS;  Service: Orthopedics;  Laterality: Left;    Social History   Socioeconomic History   Marital status: Married    Spouse name: Gene   Number of children: 2   Years of education: Not on file   Highest education level: Not on file  Occupational History   Not on file  Tobacco Use   Smoking status: Former    Current packs/day: 0.00    Average packs/day: 1 pack/day for 30.0 years (30.0 ttl pk-yrs)    Types: Cigarettes  Start date: 09/17/1990    Quit date: 09/17/2020    Years since quitting: 2.5   Smokeless tobacco: Never  Vaping Use   Vaping status: Never Used  Substance and Sexual Activity   Alcohol use: Not Currently    Alcohol/week: 1.0 standard drink of alcohol    Types: 1 Standard drinks or equivalent per week    Comment: beer occ.   Drug use: No   Sexual activity: Yes    Birth control/protection: Post-menopausal  Other Topics Concern   Not on file  Social History Narrative   Lives in [place] with her husband.  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   The labs and vital signs and weight was done in infusion   Social Determinants of Health   Financial Resource Strain: Not on file  Food Insecurity: No Food Insecurity (02/11/2023)   Hunger Vital Sign    Worried About Running Out of Food in the Last Year: Never true    Ran Out of Food in the Last Year: Never true  Transportation Needs: No Transportation Needs (02/11/2023)   PRAPARE - Administrator, Civil Service (Medical): No    Lack of Transportation (Non-Medical): No  Physical Activity: Not on file  Stress: Not on file  Social Connections: Not on file  Intimate Partner Violence: Not At Risk (02/11/2023)   Humiliation, Afraid, Rape, and Kick questionnaire    Fear of Current or Ex-Partner: No    Emotionally Abused: No    Physically Abused: No    Sexually Abused: No    Family History  Problem Relation Age of Onset   Diabetes Mother        alive @ 61   Goiter Mother    Heart disease Father        died of MI @ 31   Osteoporosis Maternal Grandmother    Colon cancer Maternal Grandfather    Breast cancer Neg Hx    Ovarian cancer Neg Hx      Current Outpatient Medications:    ALPRAZolam (XANAX) 0.5 MG tablet, Take 1 tablet (0.5 mg total) by mouth daily as needed for anxiety. TAKE ONE TABLET (0.5 MG) BY MOUTH EVERY DAY AS NEEDED, Disp: 30 tablet, Rfl: 0   citalopram (CELEXA) 10 MG tablet, Take 1 tablet (10 mg total) by mouth daily., Disp: 30 tablet, Rfl: 3   dronabinol (MARINOL) 2.5 MG capsule, Take 1 capsule (2.5 mg total) by mouth 2 (two) times daily before a meal., Disp: 60 capsule, Rfl: 0   fentaNYL (DURAGESIC) 25 MCG/HR, Place 1 patch onto the skin every 3 (three) days., Disp: 10 patch, Rfl: 0   gabapentin (NEURONTIN) 300 MG capsule, Take 1 capsule (300 mg total) by mouth 3 (three) times daily., Disp: 90 capsule, Rfl: 3   hydrocortisone (CORTEF) 10 MG tablet, TAKE 1 TABLET BY MOUTH  NIGHTLY, Disp: 30 tablet, Rfl: 3   hydrocortisone (CORTEF) 20 MG tablet, TAKE 1 TABLET BY MOUTH EVERY MORNING, Disp: 30 tablet, Rfl: 3   HYDROmorphone (DILAUDID) 2 MG tablet, Take 1-2 tablets (2-4 mg total) by mouth every 4 (four) hours as needed for severe pain., Disp: 120 tablet, Rfl: 0   lidocaine (LIDODERM) 5 %, Place 1 patch onto the skin daily. Remove & Discard patch within 12 hours or as directed by MD, Disp: 30 patch, Rfl: 0   losartan (COZAAR) 50 MG tablet, Take 50 mg by mouth daily., Disp: , Rfl:  metoprolol tartrate (LOPRESSOR) 25 MG tablet, Take 25 mg by mouth in the morning., Disp: , Rfl:    nitrofurantoin, macrocrystal-monohydrate, (MACROBID) 100 MG capsule, Take 100 mg by mouth 2 (two) times daily., Disp: , Rfl:    OLANZapine (ZYPREXA) 5 MG tablet, Take 1 tablet (5 mg total) by mouth at bedtime., Disp: 30 tablet, Rfl: 1   ondansetron (ZOFRAN) 4 MG tablet, TAKE 1 TABLET BY MOUTH EVERY 8 HOURS AS NEEDED FOR NAUSEA AND VOMITING (Patient taking differently: Take 4 mg by mouth every 8 (eight) hours as needed for nausea or vomiting. TAKE 1 TABLET BY MOUTH EVERY 8 HOURS AS NEEDED FOR NAUSEA AND VOMITING), Disp: 20 tablet, Rfl: 1   predniSONE (DELTASONE) 10 MG tablet, Take 1 tablet (10 mg total) by mouth daily with breakfast., Disp: 7 tablet, Rfl: 0   prochlorperazine (COMPAZINE) 10 MG tablet, Take 1 tablet (10 mg total) by mouth every 6 (six) hours as needed for nausea or vomiting., Disp: 30 tablet, Rfl: 1   Vedolizumab (ENTYVIO IV), Inject 1 Dose into the vein as directed. Every 4 weeks at her home, Disp: , Rfl:    acetaminophen (TYLENOL) 500 MG tablet, Take 500 mg by mouth every 6 (six) hours as needed. (Patient not taking: Reported on 02/17/2023), Disp: , Rfl:    dicyclomine (BENTYL) 10 MG capsule, Take 1 capsule (10 mg total) by mouth every 8 (eight) hours as needed for spasms. (Patient not taking: Reported on 02/17/2023), Disp: 30 capsule, Rfl: 0   valACYclovir (VALTREX) 1000 MG tablet, Take  1 tablet (1,000 mg total) by mouth 3 (three) times daily. (Patient not taking: Reported on 02/10/2023), Disp: 30 tablet, Rfl: 0 No current facility-administered medications for this visit.  Facility-Administered Medications Ordered in Other Visits:    CARBOplatin (PARAPLATIN) 620 mg in sodium chloride 0.9 % 250 mL chemo infusion, 620 mg, Intravenous, Once, Creig Hines, MD   heparin lock flush 100 unit/mL, 500 Units, Intracatheter, Once PRN, Creig Hines, MD   PACLitaxel (TAXOL) 336 mg in sodium chloride 0.9 % 500 mL chemo infusion (> 80mg /m2), 175 mg/m2 (Treatment Plan Recorded), Intravenous, Once, Creig Hines, MD, Last Rate: 185 mL/hr at 03/30/23 1205, 336 mg at 03/30/23 1205   sodium chloride flush (NS) 0.9 % injection 10 mL, 10 mL, Intravenous, PRN, Creig Hines, MD, 10 mL at 03/09/21 0906  Physical exam:  Vitals:   03/30/23 0914  BP: 112/87  Pulse: (!) 117  Resp: 18  Temp: (!) 97.5 F (36.4 C)  TempSrc: Tympanic  SpO2: 99%  Weight: 171 lb 1.6 oz (77.6 kg)  Height: 5\' 6"  (1.676 m)   Physical Exam Cardiovascular:     Rate and Rhythm: Normal rate and regular rhythm.     Heart sounds: Normal heart sounds.  Pulmonary:     Effort: Pulmonary effort is normal.     Breath sounds: Normal breath sounds.  Abdominal:     General: Bowel sounds are normal.     Palpations: Abdomen is soft.  Skin:    General: Skin is warm and dry.  Neurological:     Mental Status: She is alert and oriented to person, place, and time.         Latest Ref Rng & Units 03/30/2023    9:01 AM  CMP  Glucose 70 - 99 mg/dL 87   BUN 6 - 20 mg/dL 14   Creatinine 6.21 - 1.00 mg/dL 3.08   Sodium 657 - 846 mmol/L 136  Potassium 3.5 - 5.1 mmol/L 3.7   Chloride 98 - 111 mmol/L 104   CO2 22 - 32 mmol/L 23   Calcium 8.9 - 10.3 mg/dL 7.8   Total Protein 6.5 - 8.1 g/dL 6.9   Total Bilirubin 0.3 - 1.2 mg/dL 0.3   Alkaline Phos 38 - 126 U/L 203   AST 15 - 41 U/L 19   ALT 0 - 44 U/L 17       Latest Ref  Rng & Units 03/30/2023    9:01 AM  CBC  WBC 4.0 - 10.5 K/uL 8.2   Hemoglobin 12.0 - 15.0 g/dL 9.7   Hematocrit 78.2 - 46.0 % 31.3   Platelets 150 - 400 K/uL 319     No images are attached to the encounter.  US SOFT TISSUE HEAD & NECK (NON-THYROID)  Result Date: 03/15/2023 INDICATION: left axilla lymphadenopathy; pt has on u/s showing 1.0 cm soft tissue nodule right supraclavicular region pt has metastatic lung cancer EXAM: LIMITED RIGHT SUPRACLAVICULAR CERVICAL AND LEFT AXILLARY ULTRASOUND COMPARISON:  CTA chest, 02/10/2023. Upper extremity ultrasound, 03/15/2023 and 02/08/2023. MEDICATIONS: None ANESTHESIA/SEDATION: None COMPLICATIONS: None immediate. TECHNIQUE: Informed written consent was obtained from the patient and/or patient's representative after a discussion of the risks, benefits and alternatives to treatment. Questions regarding the procedure were encouraged and answered. A timeout was performed prior to the initiation of the procedure. Focused ultrasound at the area of concern, along the RIGHT supraclavicular neck and LEFT axilla did not reveal any discrete mass. No biopsy was performed Findings were conveyed to the patient's oncology team including Michaelyn Barter, MD. IMPRESSION: No discrete subcutaneous mass of the RIGHT supraclavicular neck is or LEFT axilla. Biopsy was NOT performed. Findings were conveyed to the patient's oncology team, including Dr. Alena Bills. Roanna Banning, MD [facility] Radiology Electronically Signed   By: Roanna Banning M.D.   On: 03/15/2023 17:09   [place] LT UPPER EXTREM LTD SOFT TISSUE NON VASCULAR  Result Date: 03/15/2023 INDICATION: left axilla lymphadenopathy; pt has on u/s showing 1.0 cm soft tissue nodule right supraclavicular region pt has metastatic lung cancer EXAM: LIMITED RIGHT SUPRACLAVICULAR CERVICAL AND LEFT AXILLARY ULTRASOUND COMPARISON:  CTA chest, 02/10/2023. Upper extremity ultrasound, 03/15/2023 and  02/08/2023. MEDICATIONS: None ANESTHESIA/SEDATION: None COMPLICATIONS: None immediate. TECHNIQUE: Informed written consent was obtained from the patient and/or patient's representative after a discussion of the risks, benefits and alternatives to treatment. Questions regarding the procedure were encouraged and answered. A timeout was performed prior to the initiation of the procedure. Focused ultrasound at the area of concern, along the RIGHT supraclavicular neck and LEFT axilla did not reveal any discrete mass. No biopsy was performed Findings were conveyed to the patient's oncology team including Michaelyn Barter, MD. IMPRESSION: No discrete subcutaneous mass of the RIGHT supraclavicular neck is or LEFT axilla. Biopsy was NOT performed. Findings were conveyed to the patient's oncology team, including Dr. Alena Bills. Roanna Banning, MD [facility] Radiology Electronically Signed   By: Roanna Banning M.D.   On: 03/15/2023 17:09   DG Chest 2 View  Result Date: 03/01/2023 CLINICAL DATA:  wheezing EXAM: CHEST - 2 VIEW COMPARISON:  July 01, 2020 December 15, 2022, Feb 10, 2023 FINDINGS: The cardiomediastinal silhouette is unchanged in contour with widening of the RIGHT paratracheal stripe.RIGHT chest port with tip terminating over the superior cavoatrial junction. Tortuous thoracic aorta. There is external compression on the RIGHT side of the trachea with corresponding mild degree of narrowing;  this is favored similar in comparison to more recent CTs. No pleural effusion. No pneumothorax. No acute pleuroparenchymal abnormality. Visualized abdomen is unremarkable. Remote LEFT-sided rib fracture. Revisualization of osseous metastatic disease with multilevel sclerotic thoracic vertebral bodies, several ribs and the LEFT humerus. IMPRESSION: 1. Favored overall similar appearance of widening of the RIGHT paratracheal stripe with external compression on the RIGHT side of the trachea  with corresponding mild narrowing. This is consistent with known malignancy. Electronically Signed   By: Meda Klinefelter M.D.   On: 03/01/2023 16:50     Assessment and plan- Patient is a 57 y.o. female with metastatic adenocarcinoma of the lung here for on treatment assessment prior to cycle 3 of CarboTaxol Avastin chemotherapy  Counts okay to proceed with cycle 3 of CarboTaxol Avastin chemotherapy today.  I will see her back in 3 weeks for cycle 4.  I will plan to get CT soft tissue neck CT chest abdomen and pelvis with contrast and bone scan in about 2 weeks.  Clinically patient feels much improved and her subcutaneous nodules have also resolved due to which her repeat biopsy could not be performed.  I suspect radiologically as well she will have good response to treatment.  Neoplasm related pain: Continue fentanyl patch which she will try to wean down as tolerated along with as needed oxycodone  Bone metastases: She will be due for her next dose of Zometa in early September 2024.   Visit Diagnosis 1. Primary malignant neoplasm of right lung metastatic to other site [facility])   2. Encounter for antineoplastic chemotherapy   3. Encounter for monoclonal antibody treatment for malignancy   4. Neoplasm related pain      Dr. Owens Shark, MD, MPH [facility] [phone] 03/30/2023 12:53 PM

## 2023-03-31 ENCOUNTER — Inpatient Hospital Stay: Payer: 59

## 2023-03-31 DIAGNOSIS — C3491 Malignant neoplasm of unspecified part of right bronchus or lung: Secondary | ICD-10-CM

## 2023-03-31 DIAGNOSIS — C770 Secondary and unspecified malignant neoplasm of lymph nodes of head, face and neck: Secondary | ICD-10-CM | POA: Diagnosis not present

## 2023-03-31 DIAGNOSIS — C7951 Secondary malignant neoplasm of bone: Secondary | ICD-10-CM | POA: Diagnosis not present

## 2023-03-31 DIAGNOSIS — Z5111 Encounter for antineoplastic chemotherapy: Secondary | ICD-10-CM | POA: Diagnosis not present

## 2023-03-31 DIAGNOSIS — Z5189 Encounter for other specified aftercare: Secondary | ICD-10-CM | POA: Diagnosis not present

## 2023-03-31 DIAGNOSIS — Z87891 Personal history of nicotine dependence: Secondary | ICD-10-CM | POA: Diagnosis not present

## 2023-03-31 DIAGNOSIS — G893 Neoplasm related pain (acute) (chronic): Secondary | ICD-10-CM | POA: Diagnosis not present

## 2023-03-31 MED ORDER — PEGFILGRASTIM-JMDB 6 MG/0.6ML ~~LOC~~ SOSY
6.0000 mg | PREFILLED_SYRINGE | Freq: Once | SUBCUTANEOUS | Status: AC
  Administered 2023-03-31: 6 mg via SUBCUTANEOUS
  Filled 2023-03-31: qty 0.6

## 2023-04-04 DIAGNOSIS — K5190 Ulcerative colitis, unspecified, without complications: Secondary | ICD-10-CM | POA: Diagnosis not present

## 2023-04-04 DIAGNOSIS — C3491 Malignant neoplasm of unspecified part of right bronchus or lung: Secondary | ICD-10-CM | POA: Diagnosis not present

## 2023-04-04 DIAGNOSIS — K523 Indeterminate colitis: Secondary | ICD-10-CM | POA: Diagnosis not present

## 2023-04-12 ENCOUNTER — Telehealth: Payer: Self-pay | Admitting: *Deleted

## 2023-04-12 ENCOUNTER — Other Ambulatory Visit: Payer: Self-pay | Admitting: *Deleted

## 2023-04-12 MED ORDER — ALPRAZOLAM 0.5 MG PO TABS: 0.5000 mg | ORAL_TABLET | Freq: Every day | ORAL | 0 refills | Status: DC | PRN

## 2023-04-12 NOTE — Telephone Encounter (Signed)
Called the pt and asked if it is ok for her the get whole body scan tom. The ct of chest, abdomen, and pelvis has not been approved yet. She is ok with that and we will let her know when we get the scans set up again

## 2023-04-13 ENCOUNTER — Encounter
Admission: RE | Admit: 2023-04-13 | Discharge: 2023-04-13 | Disposition: A | Discharge status: Home / Self Care | Payer: 59 | Source: Ambulatory Visit | Attending: Oncology | Admitting: Oncology

## 2023-04-13 ENCOUNTER — Ambulatory Visit: Admission: RE | Admit: 2023-04-13 | Payer: 59 | Source: Ambulatory Visit

## 2023-04-13 ENCOUNTER — Ambulatory Visit: Payer: 59

## 2023-04-13 DIAGNOSIS — C7951 Secondary malignant neoplasm of bone: Secondary | ICD-10-CM | POA: Diagnosis not present

## 2023-04-13 DIAGNOSIS — C3491 Malignant neoplasm of unspecified part of right bronchus or lung: Secondary | ICD-10-CM | POA: Diagnosis not present

## 2023-04-13 MED ORDER — TECHNETIUM TC 99M MEDRONATE IV KIT
20.0000 | PACK | Freq: Once | INTRAVENOUS | Status: AC | PRN
  Administered 2023-04-13: 21.88 via INTRAVENOUS

## 2023-04-15 ENCOUNTER — Encounter: Payer: Self-pay | Admitting: Oncology

## 2023-04-15 ENCOUNTER — Telehealth: Payer: Self-pay | Admitting: *Deleted

## 2023-04-15 NOTE — Telephone Encounter (Signed)
Patient had asked if she can get botox for the wrinkles around her lip from her past hx of smoking. She gets it for free. Dr Smith Robert says that is ok to do that. I called the pt to let her know she can do it

## 2023-04-19 ENCOUNTER — Ambulatory Visit
Admission: RE | Admit: 2023-04-19 | Discharge: 2023-04-19 | Disposition: A | Discharge status: Home / Self Care | Payer: 59 | Source: Ambulatory Visit | Attending: Oncology | Admitting: Oncology

## 2023-04-19 DIAGNOSIS — C3491 Malignant neoplasm of unspecified part of right bronchus or lung: Secondary | ICD-10-CM | POA: Diagnosis not present

## 2023-04-19 DIAGNOSIS — K5730 Diverticulosis of large intestine without perforation or abscess without bleeding: Secondary | ICD-10-CM | POA: Diagnosis not present

## 2023-04-19 DIAGNOSIS — E041 Nontoxic single thyroid nodule: Secondary | ICD-10-CM | POA: Diagnosis not present

## 2023-04-19 DIAGNOSIS — I6523 Occlusion and stenosis of bilateral carotid arteries: Secondary | ICD-10-CM | POA: Diagnosis not present

## 2023-04-19 DIAGNOSIS — J3800 Paralysis of vocal cords and larynx, unspecified: Secondary | ICD-10-CM | POA: Diagnosis not present

## 2023-04-19 DIAGNOSIS — C7951 Secondary malignant neoplasm of bone: Secondary | ICD-10-CM | POA: Diagnosis not present

## 2023-04-19 DIAGNOSIS — C801 Malignant (primary) neoplasm, unspecified: Secondary | ICD-10-CM | POA: Diagnosis not present

## 2023-04-19 MED ORDER — IOHEXOL 300 MG/ML  SOLN
100.0000 mL | Freq: Once | INTRAMUSCULAR | Status: AC | PRN
  Administered 2023-04-19: 100 mL via INTRAVENOUS

## 2023-04-19 MED ORDER — SODIUM CHLORIDE 0.9 % IV SOLN: INTRAVENOUS | Status: DC

## 2023-04-19 MED FILL — Fosaprepitant Dimeglumine For IV Infusion 150 MG (Base Eq): INTRAVENOUS | Qty: 5 | Status: AC

## 2023-04-19 MED FILL — Dexamethasone Sodium Phosphate Inj 100 MG/10ML: INTRAMUSCULAR | Qty: 1 | Status: AC

## 2023-04-20 ENCOUNTER — Encounter: Payer: Self-pay | Admitting: Oncology

## 2023-04-20 ENCOUNTER — Inpatient Hospital Stay: Payer: 59

## 2023-04-20 ENCOUNTER — Ambulatory Visit
Admission: RE | Admit: 2023-04-20 | Discharge: 2023-04-20 | Disposition: A | Discharge status: Home / Self Care | Payer: 59 | Source: Ambulatory Visit | Attending: Oncology | Admitting: Oncology

## 2023-04-20 ENCOUNTER — Ambulatory Visit
Admission: RE | Admit: 2023-04-20 | Discharge: 2023-04-20 | Disposition: A | Discharge status: Home / Self Care | Payer: 59 | Attending: Oncology | Admitting: Oncology

## 2023-04-20 ENCOUNTER — Inpatient Hospital Stay: Payer: 59 | Attending: Oncology

## 2023-04-20 ENCOUNTER — Inpatient Hospital Stay: Payer: 59 | Admitting: Oncology

## 2023-04-20 VITALS — BP 118/89 | HR 106 | Temp 97.4°F | Resp 18 | Ht 66.0 in | Wt 179.0 lb

## 2023-04-20 VITALS — HR 101

## 2023-04-20 DIAGNOSIS — C3491 Malignant neoplasm of unspecified part of right bronchus or lung: Secondary | ICD-10-CM | POA: Insufficient documentation

## 2023-04-20 DIAGNOSIS — I700 Atherosclerosis of aorta: Secondary | ICD-10-CM | POA: Diagnosis not present

## 2023-04-20 DIAGNOSIS — Z7952 Long term (current) use of systemic steroids: Secondary | ICD-10-CM | POA: Insufficient documentation

## 2023-04-20 DIAGNOSIS — C7970 Secondary malignant neoplasm of unspecified adrenal gland: Secondary | ICD-10-CM | POA: Diagnosis not present

## 2023-04-20 DIAGNOSIS — G4700 Insomnia, unspecified: Secondary | ICD-10-CM | POA: Insufficient documentation

## 2023-04-20 DIAGNOSIS — C7951 Secondary malignant neoplasm of bone: Secondary | ICD-10-CM | POA: Insufficient documentation

## 2023-04-20 DIAGNOSIS — K7689 Other specified diseases of liver: Secondary | ICD-10-CM | POA: Diagnosis not present

## 2023-04-20 DIAGNOSIS — G893 Neoplasm related pain (acute) (chronic): Secondary | ICD-10-CM | POA: Insufficient documentation

## 2023-04-20 DIAGNOSIS — R110 Nausea: Secondary | ICD-10-CM | POA: Insufficient documentation

## 2023-04-20 DIAGNOSIS — Z5111 Encounter for antineoplastic chemotherapy: Secondary | ICD-10-CM

## 2023-04-20 DIAGNOSIS — K5730 Diverticulosis of large intestine without perforation or abscess without bleeding: Secondary | ICD-10-CM | POA: Diagnosis not present

## 2023-04-20 DIAGNOSIS — K760 Fatty (change of) liver, not elsewhere classified: Secondary | ICD-10-CM | POA: Diagnosis not present

## 2023-04-20 DIAGNOSIS — I6523 Occlusion and stenosis of bilateral carotid arteries: Secondary | ICD-10-CM | POA: Diagnosis not present

## 2023-04-20 DIAGNOSIS — Z87891 Personal history of nicotine dependence: Secondary | ICD-10-CM | POA: Diagnosis not present

## 2023-04-20 DIAGNOSIS — K219 Gastro-esophageal reflux disease without esophagitis: Secondary | ICD-10-CM | POA: Diagnosis not present

## 2023-04-20 DIAGNOSIS — E042 Nontoxic multinodular goiter: Secondary | ICD-10-CM | POA: Diagnosis not present

## 2023-04-20 DIAGNOSIS — Z79899 Other long term (current) drug therapy: Secondary | ICD-10-CM | POA: Insufficient documentation

## 2023-04-20 DIAGNOSIS — Z5112 Encounter for antineoplastic immunotherapy: Secondary | ICD-10-CM | POA: Diagnosis not present

## 2023-04-20 DIAGNOSIS — J3800 Paralysis of vocal cords and larynx, unspecified: Secondary | ICD-10-CM | POA: Diagnosis not present

## 2023-04-20 DIAGNOSIS — Z800 Family history of malignant neoplasm of digestive organs: Secondary | ICD-10-CM | POA: Insufficient documentation

## 2023-04-20 DIAGNOSIS — I10 Essential (primary) hypertension: Secondary | ICD-10-CM | POA: Insufficient documentation

## 2023-04-20 DIAGNOSIS — R937 Abnormal findings on diagnostic imaging of other parts of musculoskeletal system: Secondary | ICD-10-CM | POA: Diagnosis not present

## 2023-04-20 LAB — CMP (CANCER CENTER ONLY)
ALT: 15 U/L (ref 0–44)
AST: 15 U/L (ref 15–41)
Albumin: 3 g/dL — ABNORMAL LOW (ref 3.5–5.0)
Alkaline Phosphatase: 195 U/L — ABNORMAL HIGH (ref 38–126)
Anion gap: 7 (ref 5–15)
BUN: 10 mg/dL (ref 6–20)
CO2: 23 mmol/L (ref 22–32)
Calcium: 7.9 mg/dL — ABNORMAL LOW (ref 8.9–10.3)
Chloride: 106 mmol/L (ref 98–111)
Creatinine: 0.49 mg/dL (ref 0.44–1.00)
GFR, Estimated: >60 mL/min (ref >60)
Glucose, Bld: 100 mg/dL — ABNORMAL HIGH (ref 70–99)
Potassium: 3.3 mmol/L — ABNORMAL LOW (ref 3.5–5.1)
Sodium: 136 mmol/L (ref 135–145)
Total Bilirubin: 0.1 mg/dL — ABNORMAL LOW (ref 0.3–1.2)
Total Protein: 6.4 g/dL — ABNORMAL LOW (ref 6.5–8.1)

## 2023-04-20 LAB — CBC WITH DIFFERENTIAL (CANCER CENTER ONLY)
Abs Immature Granulocytes: 0.07 10*3/uL (ref 0.00–0.07)
Basophils Absolute: 0 10*3/uL (ref 0.0–0.1)
Basophils Relative: 1 %
Eosinophils Absolute: 0.1 10*3/uL (ref 0.0–0.5)
Eosinophils Relative: 1 %
HCT: 28.8 % — ABNORMAL LOW (ref 36.0–46.0)
Hemoglobin: 9 g/dL — ABNORMAL LOW (ref 12.0–15.0)
Immature Granulocytes: 1 %
Lymphocytes Relative: 14 %
Lymphs Abs: 1.1 10*3/uL (ref 0.7–4.0)
MCH: 27.9 pg (ref 26.0–34.0)
MCHC: 31.3 g/dL (ref 30.0–36.0)
MCV: 89.2 fL (ref 80.0–100.0)
Monocytes Absolute: 0.6 10*3/uL (ref 0.1–1.0)
Monocytes Relative: 8 %
Neutro Abs: 5.8 10*3/uL (ref 1.7–7.7)
Neutrophils Relative %: 75 %
Platelet Count: 270 10*3/uL (ref 150–400)
RBC: 3.23 MIL/uL — ABNORMAL LOW (ref 3.87–5.11)
RDW: 21.6 % — ABNORMAL HIGH (ref 11.5–15.5)
WBC Count: 7.6 10*3/uL (ref 4.0–10.5)
nRBC: 0.3 % — ABNORMAL HIGH (ref 0.0–0.2)

## 2023-04-20 MED ORDER — SODIUM CHLORIDE 0.9 % IV SOLN
10.0000 mg | Freq: Once | INTRAVENOUS | Status: AC
  Administered 2023-04-20: 10 mg via INTRAVENOUS
  Filled 2023-04-20: qty 10

## 2023-04-20 MED ORDER — SODIUM CHLORIDE 0.9 % IV SOLN
175.0000 mg/m2 | Freq: Once | INTRAVENOUS | Status: AC
  Administered 2023-04-20: 336 mg via INTRAVENOUS
  Filled 2023-04-20: qty 56

## 2023-04-20 MED ORDER — SODIUM CHLORIDE 0.9 % IV SOLN
616.0000 mg | Freq: Once | INTRAVENOUS | Status: AC
  Administered 2023-04-20: 620 mg via INTRAVENOUS
  Filled 2023-04-20: qty 62

## 2023-04-20 MED ORDER — SODIUM CHLORIDE 0.9 % IV SOLN
150.0000 mg | Freq: Once | INTRAVENOUS | Status: AC
  Administered 2023-04-20: 150 mg via INTRAVENOUS
  Filled 2023-04-20: qty 150

## 2023-04-20 MED ORDER — SODIUM CHLORIDE 0.9 % IV SOLN
15.0000 mg/kg | Freq: Once | INTRAVENOUS | Status: AC
  Administered 2023-04-20: 1200 mg via INTRAVENOUS
  Filled 2023-04-20: qty 48

## 2023-04-20 MED ORDER — DIPHENHYDRAMINE HCL 50 MG/ML IJ SOLN
50.0000 mg | Freq: Once | INTRAMUSCULAR | Status: AC
  Administered 2023-04-20: 50 mg via INTRAVENOUS
  Filled 2023-04-20: qty 1

## 2023-04-20 MED ORDER — FAMOTIDINE IN NACL 20-0.9 MG/50ML-% IV SOLN
20.0000 mg | Freq: Once | INTRAVENOUS | Status: AC
  Administered 2023-04-20: 20 mg via INTRAVENOUS
  Filled 2023-04-20: qty 50

## 2023-04-20 MED ORDER — HEPARIN SOD (PORK) LOCK FLUSH 100 UNIT/ML IV SOLN
500.0000 [IU] | Freq: Once | INTRAVENOUS | Status: AC | PRN
  Administered 2023-04-20: 500 [IU]
  Filled 2023-04-20: qty 5

## 2023-04-20 MED ORDER — PALONOSETRON HCL INJECTION 0.25 MG/5ML
0.2500 mg | Freq: Once | INTRAVENOUS | Status: AC
  Administered 2023-04-20: 0.25 mg via INTRAVENOUS
  Filled 2023-04-20: qty 5

## 2023-04-20 MED ORDER — SODIUM CHLORIDE 0.9 % IV SOLN
Freq: Once | INTRAVENOUS | Status: AC
  Filled 2023-04-20: qty 250

## 2023-04-20 MED ORDER — POTASSIUM CHLORIDE CRYS ER 20 MEQ PO TBCR: 20.0000 meq | EXTENDED_RELEASE_TABLET | Freq: Every day | ORAL | 0 refills | Status: DC

## 2023-04-20 NOTE — Progress Notes (Signed)
Hematology/Oncology Consult note [facility]  Telephone:([phone] [phone]  Patient Care Team: Armando Gang, FNP as PCP - General (Family Medicine) Glory Buff, RN as Oncology Nurse Navigator Carmina Miller, MD as Radiation Oncologist (Radiation Oncology) Vida Rigger, MD as Consulting Physician (Pulmonary Disease)   Name of the patient: Gina Sanchez  [id number]  January 07, 1966   Date of visit: 04/20/23  Diagnosis-stage IV adenocarcinoma of the lung  Chief complaint/ Reason for visit-on treatment assessment prior to cycle 4 of CarboTaxol Avastin chemotherapy  Heme/Onc history: Patient is a 57 year old female with a past medical history significant for hypertension hyperlipidemia and anxiety who presented with right thigh pain and was found to have an acute right proximal femoral shaft fracture.  She underwent operative fixation on 10/12/2020.  MRI of femur showed heterogeneously enhancing osseous lesion within the area which would be nonspecific versus office neoplasm or metastatic lesion.  CT chest abdomen and pelvis with contrast showed an enlarged pretracheal lymph node 2.2 x 1.5 cm and a right paratracheal lymph node measuring 2.7 x 1.3 cm.  Prevascular node measuring 0.3 x 0.9 cm.  5 x 4 mm right middle lobe nodule.  2.8 x 1.9 cm left adrenal lesion.    Reamings from the right femur showed metastatic poorly differentiated carcinoma.  Immunohistochemistry showed was positive for pancytokeratin, CK7 and patchy CK20 with patchy dim expression of TTF-1.  Cells negative for Melan-A, CDX2, PAX8, Napsin A, GATA3, p40, CD56, p16 and thyroglobulin.  Findings compatible with metastatic carcinoma but because of decalcification immunohistochemical staining is unreliable.  Patchy dim staining with TTF-1 suspicious for lung primary but not a definitive diagnosis.   Repeat supraclavicular lymph node biopsy showed metastatic adenocarcinoma.  Tumor cells  positive for CK7 with focal weak staining for TTF-1.  Suggestive of lung origin in the proper clinical context.  Cells were negative for GATA3 PAX8 CDX2 and CK20 and Napsin A.  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  She is currently on hydrocortisone twice daily.  Thyroid functions presently are normal     Patient was treated with Ledell Noss Alimta Keytruda chemotherapy first-line followed by maintenance Alimta and Keytruda.  She developed autoimmune colitis secondary to [facility] and therefore that was stopped.  She is on Entyvio and follows up with GI for her autoimmune colitis.  Most recent regimen was maintenance Alimta. Disease progression in Jan 2024. Patients started on sotorasib.  Presently on full dose 960 mg daily.  Disease progression in May 2024.  Patient switched to CarboTaxol Avastin in June 2024    Interval history-tolerating chemotherapy well so far.  Appetite is improved and she has gained weight.  Pain is presently well-controlled with fentanyl and as needed oxycodone.  ECOG PS- 1 Pain scale- 0 Opioid associated constipation- no  Review of systems- Review of Systems  Constitutional:  Positive for malaise/fatigue. Negative for chills, fever and weight loss.  HENT:  Negative for congestion, ear discharge and nosebleeds.   Eyes:  Negative for blurred vision.  Respiratory:  Negative for cough, hemoptysis, sputum production, shortness of breath and wheezing.   Cardiovascular:  Negative for chest pain, palpitations, orthopnea and claudication.  Gastrointestinal:  Negative for abdominal pain, blood in stool, constipation, diarrhea, heartburn, melena, nausea and vomiting.  Genitourinary:  Negative for dysuria, flank pain, frequency, hematuria and urgency.  Musculoskeletal:  Negative for back pain, joint pain and myalgias.  Skin:  Negative for rash.   Neurological:  Negative for dizziness, tingling, focal weakness, seizures, weakness and headaches.  Endo/Heme/Allergies:  Does not bruise/bleed easily.  Psychiatric/Behavioral:  Negative for depression and suicidal ideas. The patient does not have insomnia.       Allergies  Allergen Reactions   Factive [Gemifloxacin] Rash     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   Palpitations    Precordial chest pain    Wrist fracture 03/2021   left     Past Surgical History:  Procedure Laterality Date   BREAST BIOPSY Right 2017   benign   CERVICAL BIOPSY  W/ LOOP ELECTRODE EXCISION     COLONOSCOPY  06/11/2022   Procedure: COLONOSCOPY;  Surgeon: Toney Reil, MD;  Location: [facility] ENDOSCOPY;  Service: Gastroenterology;;   COLONOSCOPY WITH PROPOFOL N/A 07/03/2015   Procedure: COLONOSCOPY WITH PROPOFOL;  Surgeon: Wallace Cullens, MD;  Location: [facility] ENDOSCOPY;  Service: Gastroenterology;  Laterality: N/A;   COLONOSCOPY WITH PROPOFOL N/A 01/06/2022   Procedure: COLONOSCOPY WITH PROPOFOL;  Surgeon: Toney Reil, MD;  Location: [facility] ENDOSCOPY;  Service: Gastroenterology;  Laterality: N/A;   ESOPHAGOGASTRODUODENOSCOPY (EGD) WITH PROPOFOL N/A 07/03/2015   Procedure: ESOPHAGOGASTRODUODENOSCOPY (EGD) WITH PROPOFOL;  Surgeon: Wallace Cullens, MD;  Location: [facility] ENDOSCOPY;  Service: Gastroenterology;  Laterality: N/A;   FLEXIBLE SIGMOIDOSCOPY N/A 03/12/2022   Procedure: FLEXIBLE SIGMOIDOSCOPY;  Surgeon: Toney Reil, MD;  Location: [facility] ENDOSCOPY;  Service: Gastroenterology;  Laterality: N/A;   FLEXIBLE SIGMOIDOSCOPY N/A 08/17/2022   Procedure: FLEXIBLE SIGMOIDOSCOPY;  Surgeon: Toney Reil, MD;  Location: [facility] ENDOSCOPY;  Service: Gastroenterology;   Laterality: N/A;   FRACTURE SURGERY     INTRAMEDULLARY (IM) NAIL INTERTROCHANTERIC Right 09/15/2020   Procedure: INTRAMEDULLARY (IM) NAIL INTERTROCHANTRIC;  Surgeon: Christena Flake, MD;  Location: [facility] ORS;  Service: Orthopedics;  Laterality: Right;   IR CV LINE INJECTION  07/14/2022   IR IMAGING GUIDED PORT INSERTION  11/28/2020   LEFT HEART CATH AND CORONARY ANGIOGRAPHY N/A 03/28/2017   Procedure: Left Heart Cath and Coronary Angiography;  Surgeon: Runell Gess, MD;  Location: [facility] INVASIVE CV LAB;  Service: Cardiovascular;  Laterality: N/A;   ORIF WRIST FRACTURE Left 03/31/2021   Procedure: OPEN REDUCTION INTERNAL FIXATION (ORIF) LEFT DISTAL RADIUS FRACTURE.;  Surgeon: Christena Flake, MD;  Location: [facility] ORS;  Service: Orthopedics;  Laterality: Left;    Social History   Socioeconomic History   Marital status: Married    Spouse name: Gene   Number of children: 2   Years of education: Not on file   Highest education level: Not on file  Occupational History   Not on file  Tobacco Use   Smoking status: Former    Current packs/day: 0.00    Average packs/day: 1 pack/day for 30.0 years (30.0 ttl pk-yrs)    Types: Cigarettes    Start date: 09/17/1990    Quit date: 09/17/2020    Years since quitting: 2.5   Smokeless tobacco: Never  Vaping Use   Vaping status: Never Used  Substance and Sexual Activity   Alcohol use: Not Currently    Alcohol/week: 1.0  standard drink of alcohol    Types: 1 Standard drinks or equivalent per week    Comment: beer occ.   Drug use: No   Sexual activity: Yes    Birth control/protection: Post-menopausal  Other Topics Concern   Not on file  Social History Narrative   Lives in [place] with her husband.  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   The labs and vital signs and weight was done in infusion   Social Determinants of Health   Financial Resource Strain: Not on file  Food Insecurity: No Food Insecurity  (02/11/2023)   Hunger Vital Sign    Worried About Running Out of Food in the Last Year: Never true    Ran Out of Food in the Last Year: Never true  Transportation Needs: No Transportation Needs (02/11/2023)   PRAPARE - Administrator, Civil Service (Medical): No    Lack of Transportation (Non-Medical): No  Physical Activity: Not on file  Stress: Not on file  Social Connections: Not on file  Intimate Partner Violence: Not At Risk (02/11/2023)   Humiliation, Afraid, Rape, and Kick questionnaire    Fear of Current or Ex-Partner: No    Emotionally Abused: No    Physically Abused: No    Sexually Abused: No    Family History  Problem Relation Age of Onset   Diabetes Mother        alive @ 46   Goiter Mother    Heart disease Father        died of MI @ 43   Osteoporosis Maternal Grandmother    Colon cancer Maternal Grandfather    Breast cancer Neg Hx    Ovarian cancer Neg Hx      Current Outpatient Medications:    acetaminophen (TYLENOL) 500 MG tablet, Take 500 mg by mouth every 6 (six) hours as needed., Disp: , Rfl:    ALPRAZolam (XANAX) 0.5 MG tablet, Take 1 tablet (0.5 mg total) by mouth daily as needed for anxiety. TAKE ONE TABLET (0.5 MG) BY MOUTH EVERY DAY AS NEEDED, Disp: 30 tablet, Rfl: 0   citalopram (CELEXA) 10 MG tablet, Take 1 tablet (10 mg total) by mouth daily., Disp: 30 tablet, Rfl: 3   fentaNYL (DURAGESIC) 25 MCG/HR, Place 1 patch onto the skin every 3 (three) days., Disp: 10 patch, Rfl: 0   hydrocortisone (CORTEF) 10 MG tablet, TAKE 1 TABLET BY MOUTH NIGHTLY, Disp: 30 tablet, Rfl: 3   hydrocortisone (CORTEF) 20 MG tablet, TAKE 1 TABLET BY MOUTH EVERY MORNING, Disp: 30 tablet, Rfl: 3   HYDROmorphone (DILAUDID) 2 MG tablet, Take 1-2 tablets (2-4 mg total) by mouth every 4 (four) hours as needed for severe pain., Disp: 120 tablet, Rfl: 0   losartan (COZAAR) 50 MG tablet, Take 50 mg by mouth daily., Disp: , Rfl:    OLANZapine (ZYPREXA) 5 MG tablet, Take 1 tablet  (5 mg total) by mouth at bedtime., Disp: 30 tablet, Rfl: 1   ondansetron (ZOFRAN) 4 MG tablet, TAKE 1 TABLET BY MOUTH EVERY 8 HOURS AS NEEDED FOR NAUSEA AND VOMITING (Patient taking differently: Take 4 mg by mouth every 8 (eight) hours as needed for nausea or vomiting. TAKE 1 TABLET BY MOUTH EVERY 8 HOURS AS NEEDED FOR NAUSEA AND VOMITING), Disp: 20 tablet, Rfl: 1   potassium chloride SA (KLOR-CON M) 20 MEQ tablet, Take 1 tablet (20 mEq total) by mouth daily., Disp: 7 tablet, Rfl: 0  prochlorperazine (COMPAZINE) 10 MG tablet, Take 1 tablet (10 mg total) by mouth every 6 (six) hours as needed for nausea or vomiting., Disp: 30 tablet, Rfl: 1   Vedolizumab (ENTYVIO IV), Inject 1 Dose into the vein as directed. Every 4 weeks at her home, Disp: , Rfl:    dicyclomine (BENTYL) 10 MG capsule, Take 1 capsule (10 mg total) by mouth every 8 (eight) hours as needed for spasms. (Patient not taking: Reported on 02/17/2023), Disp: 30 capsule, Rfl: 0   dronabinol (MARINOL) 2.5 MG capsule, Take 1 capsule (2.5 mg total) by mouth 2 (two) times daily before a meal. (Patient not taking: Reported on 04/20/2023), Disp: 60 capsule, Rfl: 0   gabapentin (NEURONTIN) 300 MG capsule, Take 1 capsule (300 mg total) by mouth 3 (three) times daily. (Patient not taking: Reported on 04/20/2023), Disp: 90 capsule, Rfl: 3   lidocaine (LIDODERM) 5 %, Place 1 patch onto the skin daily. Remove & Discard patch within 12 hours or as directed by MD (Patient not taking: Reported on 04/20/2023), Disp: 30 patch, Rfl: 0   metoprolol tartrate (LOPRESSOR) 25 MG tablet, Take 25 mg by mouth in the morning. (Patient not taking: Reported on 04/20/2023), Disp: , Rfl:    nitrofurantoin, macrocrystal-monohydrate, (MACROBID) 100 MG capsule, Take 100 mg by mouth 2 (two) times daily. (Patient not taking: Reported on 04/20/2023), Disp: , Rfl:    predniSONE (DELTASONE) 10 MG tablet, Take 1 tablet (10 mg total) by mouth daily with breakfast. (Patient not taking: Reported on  04/20/2023), Disp: 7 tablet, Rfl: 0   valACYclovir (VALTREX) 1000 MG tablet, Take 1 tablet (1,000 mg total) by mouth 3 (three) times daily. (Patient not taking: Reported on 02/10/2023), Disp: 30 tablet, Rfl: 0 No current facility-administered medications for this visit.  Facility-Administered Medications Ordered in Other Visits:    CARBOplatin (PARAPLATIN) 620 mg in sodium chloride 0.9 % 250 mL chemo infusion, 620 mg, Intravenous, Once, Creig Hines, MD   PACLitaxel (TAXOL) 336 mg in sodium chloride 0.9 % 500 mL chemo infusion (> 80mg /m2), 175 mg/m2 (Treatment Plan Recorded), Intravenous, Once, Creig Hines, MD, Last Rate: 185 mL/hr at 04/20/23 1141, 336 mg at 04/20/23 1141   sodium chloride flush (NS) 0.9 % injection 10 mL, 10 mL, Intravenous, PRN, Creig Hines, MD, 10 mL at 03/09/21 0906  Physical exam:  Vitals:   04/20/23 0850  BP: 118/89  Pulse: (!) 106  Resp: 18  Temp: (!) 97.4 F (36.3 C)  TempSrc: Tympanic  SpO2: 98%  Weight: 179 lb (81.2 kg)  Height: 5\' 6"  (1.676 m)   Physical Exam Cardiovascular:     Rate and Rhythm: Normal rate and regular rhythm.     Heart sounds: Normal heart sounds.  Pulmonary:     Effort: Pulmonary effort is normal.     Breath sounds: Normal breath sounds.  Abdominal:     General: Bowel sounds are normal.     Palpations: Abdomen is soft.  Skin:    General: Skin is warm and dry.  Neurological:     Mental Status: She is alert and oriented to person, place, and time.         Latest Ref Rng & Units 04/20/2023    8:34 AM  CMP  Glucose 70 - 99 mg/dL 295   BUN 6 - 20 mg/dL 10   Creatinine 6.21 - 1.00 mg/dL 3.08   Sodium 657 - 846 mmol/L 136   Potassium 3.5 - 5.1 mmol/L 3.3   Chloride  98 - 111 mmol/L 106   CO2 22 - 32 mmol/L 23   Calcium 8.9 - 10.3 mg/dL 7.9   Total Protein 6.5 - 8.1 g/dL 6.4   Total Bilirubin 0.3 - 1.2 mg/dL 0.1   Alkaline Phos 38 - 126 U/L 195   AST 15 - 41 U/L 15   ALT 0 - 44 U/L 15       Latest Ref Rng & Units  04/20/2023    8:34 AM  CBC  WBC 4.0 - 10.5 K/uL 7.6   Hemoglobin 12.0 - 15.0 g/dL 9.0   Hematocrit 16.1 - 46.0 % 28.8   Platelets 150 - 400 K/uL 270     No images are attached to the encounter.  CT CHEST ABDOMEN PELVIS W CONTRAST  Result Date: 04/20/2023 CLINICAL DATA:  Metastatic lung cancer restaging * Tracking Code: BO * EXAM: CT CHEST, ABDOMEN, AND PELVIS WITH CONTRAST TECHNIQUE: Multidetector CT imaging of the chest, abdomen and pelvis was performed following the standard protocol during bolus administration of intravenous contrast. RADIATION DOSE REDUCTION: This exam was performed according to the departmental dose-optimization program which includes automated exposure control, adjustment of the mA and/or kV according to patient size and/or use of iterative reconstruction technique. CONTRAST:  OMNIPAQUE IOHEXOL 300 MG/ML  SOLN COMPARISON:  02/03/2023 FINDINGS: CT CHEST FINDINGS Cardiovascular: Right chest port catheter. Normal heart size. No pericardial effusion. Mediastinum/Nodes: Complete resolution of previously seen enlarged left axillary, partially imaged lower cervical, and left hilar lymph nodes (series 3, image 8, 23). Unchanged matted, treated pretracheal and right hilar soft tissue. No persistently enlarged mediastinal, hilar, or axillary lymph nodes. Thyroid gland, trachea, and esophagus demonstrate no significant findings. Lungs/Pleura: Slight interval increase in bandlike heterogeneous ground-glass airspace opacity about the perihilar right lung (series 5, image 59). Diffuse bilateral bronchial wall thickening. No pleural effusion or pneumothorax. Musculoskeletal: No chest wall abnormality. No acute osseous findings. CT ABDOMEN PELVIS FINDINGS Hepatobiliary: No solid liver abnormality is seen. Unchanged benign fluid attenuation cyst of the anterior liver dome (series 3, image 40) hepatic steatosis. No gallstones, gallbladder wall thickening, or biliary dilatation. Pancreas:  Unremarkable. No pancreatic ductal dilatation or surrounding inflammatory changes. Spleen: Normal in size without significant abnormality. Adrenals/Urinary Tract: Significantly diminished size of a nodule of the medial limb of the left adrenal gland, measuring 0.7 x 0.6 cm, previously 1.2 x 1.2 cm. Otherwise unchanged thickening of the bilateral adrenal glands. Kidneys are normal, without renal calculi, solid lesion, or hydronephrosis. Bladder is unremarkable. Stomach/Bowel: Stomach is within normal limits. Appendix appears normal. No evidence of bowel wall thickening, distention, or inflammatory changes. Occasional sigmoid diverticula. Vascular/Lymphatic: Aortic atherosclerosis. No enlarged abdominal or pelvic lymph nodes. Reproductive: No mass or other abnormality. Other: No abdominal wall hernia or abnormality. Complete interval resolution of previously seen cutaneous soft tissue nodules, for example in the right labial subcutaneous fat (series 3, image 132) and in the right buttock (series 3, image 96). Nearly complete resolution of multiple peritoneal nodules, with tiny, irregular residua, for example in the midline ventral abdomen an irregular residual measuring 0.3 cm, nodule at this site previously 1.1 cm (series 3, image 58). No ascites. Musculoskeletal: No acute osseous findings. Intramedullary nail fixation of the right hip. Significant interval increase in widespread sclerotic osseous metastatic disease throughout the included axial and proximal appendicular skeleton, for example significant enlargement of a sclerotic lesion of the sternal body (series 9, image 88) as well as increased sclerosis of multiple vertebral bodies. IMPRESSION: 1. Complete resolution  of previously seen enlarged left axillary, partially imaged lower cervical, and left hilar lymph nodes. Unchanged matted, treated pretracheal and right hilar soft tissue. 2. Slight interval increase in bandlike heterogeneous ground-glass airspace  opacity about the perihilar right lung, consistent with developing radiation pneumonitis and fibrosis. 3. Significantly diminished size of a nodule of the medial limb of the left adrenal gland, with otherwise unchanged bilateral adrenal thickening. 4. Complete interval resolution of previously seen cutaneous soft tissue nodules. Nearly complete resolution of multiple peritoneal nodules, with tiny, irregular residua. 5. Significant interval increase in widespread sclerotic osseous metastatic disease throughout the included axial and proximal appendicular skeleton, consistent with post treatment sclerotic response. 6. Constellation of findings is consistent with treatment response of metastatic disease. 7. Hepatic steatosis. Aortic Atherosclerosis (ICD10-I70.0). Electronically Signed   By: Jearld Lesch M.D.   On: 04/20/2023 10:29   CT SOFT TISSUE NECK W CONTRAST  Result Date: 04/20/2023 CLINICAL DATA:  Primary malignant neoplasm of the right lung metastatic to other sites. Vocal cord paralysis. EXAM: CT NECK WITH CONTRAST TECHNIQUE: Multidetector CT imaging of the neck was performed using the standard protocol following the bolus administration of intravenous contrast. RADIATION DOSE REDUCTION: This exam was performed according to the departmental dose-optimization program which includes automated exposure control, adjustment of the mA and/or kV according to patient size and/or use of iterative reconstruction technique. CONTRAST:  OMNIPAQUE IOHEXOL 300 MG/ML  SOLN COMPARISON:  CT of the neck with contrast 09/16/2022. Right supraclavicular soft tissue ultrasound 03/15/2023. Whole-body bone scan 04/13/2023. FINDINGS: Pharynx and larynx: Asymmetric dilation of the right lateral ventricle noted. The true vocal cords are symmetric. No focal mucosal or submucosal lesions are present. Nasopharynx is clear. Soft palate and tongue base are within normal limits. Vallecula and epiglottis are within normal limits. The  paranasal sinuses and mastoid air cells are clear. Salivary glands: The submandibular and parotid glands and ducts are within normal limits. Thyroid: The nodule at the inferior aspect of the right lower lobe is better visualized on today's study and measures up to 16 mm. No other nodules are present. Thyroid is normal in size. Lymph nodes: No significant cervical adenopathy is present. Vascular: Minimal atherosclerotic calcifications are present at the carotid bifurcations without significant stenosis or change. Limited intracranial: Within normal limits. Visualized orbits: The globes and orbits are within normal limits. Mastoids and visualized paranasal sinuses: The paranasal sinuses and mastoid air cells are clear. Skeleton: New multifocal sclerotic changes are present within the axial skeleton. Diffuse sclerotic lesion is present within the spinous process of C3. A sclerotic lesion posteriorly and superiorly on the right at T3 extends towards the right pedicle. Maximal measurement is 9.5 mm. Diffuse sclerotic changes are present at the T5 vertebral body. No pathologic fractures are present. Sclerotic changes are present in the left second, sclerotic changes are present in the left third and fourth ribs and posteriorly and laterally in the right third rib. Sclerotic changes are present in the right scapula. Upper chest: Architectural changes noted in the medial right upper lobe patchy opacities are noted posteriorly in the right upper lobe. IMPRESSION: 1. Progressive multifocal sclerotic changes within the axial skeleton consistent with metastatic disease. 2. No pathologic fractures. 3. 16 mm nodule at the inferior aspect of the right thyroid lobe is better visualized on today's study. Recommend thyroid US (ref: J Am Coll Radiol. 2015 Feb;12(2): 143-50). 4. No significant cervical adenopathy. 5. Asymmetric dilation of the right lateral ventricle compatible with right vocal cord paralysis. 6.  Architectural changes  noted in the medial right upper lobe and patchy opacities noted posteriorly in the right upper lobe. See dedicated CT of the chest report from the same day for further description. Electronically Signed   By: Marin Roberts M.D.   On: 04/20/2023 10:07   NM Bone Scan Whole Body  Result Date: 04/19/2023 CLINICAL DATA:  Primary malignant neoplasm of right lung metastatic to other site [facility]) EXAM: NUCLEAR MEDICINE WHOLE BODY BONE SCAN TECHNIQUE: Whole body anterior and posterior images were obtained approximately 3 hours after intravenous injection of radiopharmaceutical. RADIOPHARMACEUTICALS:  21.88 mCi Technetium-36m MDP IV COMPARISON:  Feb 09, 2023, December 08, 2022 FINDINGS: Revisualization of diffuse osseous metastatic disease throughout the axial and appendicular skeleton. Compared to most recent prior, there is decreased focal uptake within the LEFT proximal femur but increased uptake within the LEFT mid femur. Interval craniotomy. Status post RIGHT femoral intramedullary rod fixation with adjacent increased radiotracer uptake consistent with known metastatic disease. Increased confluence of a focal sternal uptake in comparison to prior. Increased conspicuity of bilateral scapular uptake and RIGHT clavicular uptake in comparison most recent prior. Increased uptake within the upper lumbar spine with decreased conspicuity of uptake in the lower lumbar spine and sacrum. Otherwise similar appearance of increased radiotracer uptake within multiple bilateral ribs, the thoracic spine, the cervical spine, the iliac bones and bilateral humeri. Expected physiologic distribution of radiotracer. IMPRESSION: 1. Revisualization of diffuse osseous metastatic disease. Several sites demonstrate decreased uptake while others site demonstrate increased uptake as described above. 2. Revisualization of uptake within the LEFT femur. This places patient at risk for pathologic fracture. Electronically Signed   By: Meda Klinefelter M.D.   On: 04/19/2023 16:52     Assessment and plan- Patient is a 57 y.o. female with metastatic adenocarcinoma of the lung here for on treatment assessment prior to cycle 4 of carbo taxol avastin chemotherapy  Counts okay to proceed with cycle 4 of CarboTaxol Avastin chemotherapy today.  Blood pressure remained stable and urine protein remains trace.  I will see her back in 3 weeks for cycle 1 of maintenance a Avastin.  Official CT scan results were not back at the time of patient's visit.  I have reviewed CT chest abdomen pelvis images independently which shows excellent response to treatment with complete resolution of axillary cervical as well as hilar adenopathy.  Diminished size of left adrenal gland involvement and complete interval resolution of peritoneal nodules.  Bone scan shows mixed response to treatment with some which showed decreased uptake while is some that shows increased uptake.  I will be discussing this at tumor board next week but my plan is presently to continue with a Avastin alone followed by short-term scans in 2 months time.  It has been 2 years since her last change PS testing was done in February 2022 which did not show any actionable mutations other than K-ras G 12C for which she was given sotorasib in the past.  My plan was to repeat NGS testing at the time of her recent progression in June 2024 and I wanted to get a biopsy of the subcutaneous nodule but there were delays in obtaining it and those nodules have resolved by the time she received her first cycle of chemotherapy.  If she were to have progression with a Avastin maintenance I will be repeating either tissue or blood Tempus testing at that time.  I would like to refer the patient to [facility] at this time to see  if she would qualify for any potential clinical trials down the line if she were to have progression on a Avastin.  Neoplasm related pain: Continue fentanyl patch and as needed oxycodone  Bone  metastases: She will receive Zometa in 3 weeks.  Her most recent bone scan reported increased uptake in the left femur shaft which could get a potential risk for fracture.  Patient has already received radiation to this area.  I am repeating x-ray at this time to see if there would be a role for any prophylactic surgical fixation   Visit Diagnosis 1. Primary malignant neoplasm of right lung metastatic to other site [facility])   2. Encounter for antineoplastic chemotherapy   3. Encounter for monoclonal antibody treatment for malignancy   4. Neoplasm related pain      Dr. Owens Shark, MD, MPH [facility] [phone] 04/20/2023 2:28 PM

## 2023-04-21 ENCOUNTER — Encounter: Payer: Self-pay | Admitting: *Deleted

## 2023-04-21 NOTE — Progress Notes (Signed)
Records and clinical notes faxed to [facility] at [phone]. Their clinic will call pt with appt once scheduled.

## 2023-04-22 ENCOUNTER — Other Ambulatory Visit: Payer: Self-pay

## 2023-04-22 ENCOUNTER — Inpatient Hospital Stay: Payer: 59

## 2023-04-22 DIAGNOSIS — E042 Nontoxic multinodular goiter: Secondary | ICD-10-CM | POA: Diagnosis not present

## 2023-04-22 DIAGNOSIS — I10 Essential (primary) hypertension: Secondary | ICD-10-CM | POA: Diagnosis not present

## 2023-04-22 DIAGNOSIS — C7951 Secondary malignant neoplasm of bone: Secondary | ICD-10-CM | POA: Diagnosis not present

## 2023-04-22 DIAGNOSIS — K219 Gastro-esophageal reflux disease without esophagitis: Secondary | ICD-10-CM | POA: Diagnosis not present

## 2023-04-22 DIAGNOSIS — C7970 Secondary malignant neoplasm of unspecified adrenal gland: Secondary | ICD-10-CM | POA: Diagnosis not present

## 2023-04-22 DIAGNOSIS — K7689 Other specified diseases of liver: Secondary | ICD-10-CM | POA: Diagnosis not present

## 2023-04-22 DIAGNOSIS — C3491 Malignant neoplasm of unspecified part of right bronchus or lung: Secondary | ICD-10-CM

## 2023-04-22 DIAGNOSIS — G893 Neoplasm related pain (acute) (chronic): Secondary | ICD-10-CM | POA: Diagnosis not present

## 2023-04-22 DIAGNOSIS — K760 Fatty (change of) liver, not elsewhere classified: Secondary | ICD-10-CM | POA: Diagnosis not present

## 2023-04-22 DIAGNOSIS — Z5111 Encounter for antineoplastic chemotherapy: Secondary | ICD-10-CM | POA: Diagnosis not present

## 2023-04-22 MED ORDER — PEGFILGRASTIM-JMDB 6 MG/0.6ML ~~LOC~~ SOSY
6.0000 mg | PREFILLED_SYRINGE | Freq: Once | SUBCUTANEOUS | Status: AC
  Administered 2023-04-22: 6 mg via SUBCUTANEOUS
  Filled 2023-04-22: qty 0.6

## 2023-04-24 ENCOUNTER — Other Ambulatory Visit: Payer: Self-pay

## 2023-04-25 ENCOUNTER — Inpatient Hospital Stay (HOSPITAL_BASED_OUTPATIENT_CLINIC_OR_DEPARTMENT_OTHER): Payer: 59 | Admitting: Hospice and Palliative Medicine

## 2023-04-25 DIAGNOSIS — Z515 Encounter for palliative care: Secondary | ICD-10-CM | POA: Diagnosis not present

## 2023-04-25 DIAGNOSIS — G4700 Insomnia, unspecified: Secondary | ICD-10-CM | POA: Diagnosis not present

## 2023-04-25 DIAGNOSIS — F32A Depression, unspecified: Secondary | ICD-10-CM

## 2023-04-25 DIAGNOSIS — C3491 Malignant neoplasm of unspecified part of right bronchus or lung: Secondary | ICD-10-CM

## 2023-04-25 DIAGNOSIS — G893 Neoplasm related pain (acute) (chronic): Secondary | ICD-10-CM | POA: Diagnosis not present

## 2023-04-25 DIAGNOSIS — R110 Nausea: Secondary | ICD-10-CM

## 2023-04-25 MED ORDER — OLANZAPINE 5 MG PO TABS: 5.0000 mg | ORAL_TABLET | Freq: Every day | ORAL | 1 refills | Status: DC

## 2023-04-25 NOTE — Progress Notes (Signed)
Virtual Visit via Telephone Note  I connected with Gina Sanchez on 04/25/23 at  3:40 PM EDT by telephone and verified that I am speaking with the correct person using two identifiers.  Location: Patient: Home Provider: Clinic   I discussed the limitations, risks, security and privacy concerns of performing an evaluation and management service by telephone and the availability of in person appointments. I also discussed with the patient that there may be a patient responsible charge related to this service. The patient expressed understanding and agreed to proceed.   History of Present Illness: Gina Sanchez is a 57 y.o. 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      Observations/Objective: I called and spoke with patient by phone.  Patient reports she is doing well.  She is symptomatically improved.  She says pain is stable on transdermal fentanyl.  She occasionally requires hydromorphone for breakthrough pain but pain is better than it was.  She reports stable appetite and performance status.  Denies any other symptomatic complaints or concerns today.  Assessment and Plan: Neoplasm related pain -continue fentanyl/hydromorphone.   Depression -stable on citalopram  Nausea/insomnia -improved on olanzapine  Follow Up Instructions: Follow-up telephone visit 1-2 month   I discussed the assessment and treatment plan with the patient. The patient was provided an opportunity to ask questions and all were answered. The patient agreed with the plan and demonstrated an understanding of the instructions.   The patient was advised to call back or seek an in-person evaluation if the symptoms worsen or if the condition fails to improve as anticipated.  I provided 5 minutes of non-face-to-face time during this encounter.   Malachy Moan, NP

## 2023-04-26 ENCOUNTER — Other Ambulatory Visit: Payer: Self-pay

## 2023-05-03 ENCOUNTER — Encounter: Payer: Self-pay | Admitting: Oncology

## 2023-05-04 ENCOUNTER — Other Ambulatory Visit: Payer: Self-pay | Admitting: Oncology

## 2023-05-04 DIAGNOSIS — C3491 Malignant neoplasm of unspecified part of right bronchus or lung: Secondary | ICD-10-CM

## 2023-05-04 NOTE — Telephone Encounter (Signed)
Will postpone to 9th

## 2023-05-09 ENCOUNTER — Other Ambulatory Visit: Payer: Self-pay | Admitting: Oncology

## 2023-05-09 ENCOUNTER — Encounter: Payer: Self-pay | Admitting: Gastroenterology

## 2023-05-09 ENCOUNTER — Ambulatory Visit (INDEPENDENT_AMBULATORY_CARE_PROVIDER_SITE_OTHER): Payer: 59 | Admitting: Gastroenterology

## 2023-05-09 DIAGNOSIS — K5289 Other specified noninfective gastroenteritis and colitis: Secondary | ICD-10-CM | POA: Diagnosis not present

## 2023-05-09 DIAGNOSIS — K523 Indeterminate colitis: Secondary | ICD-10-CM

## 2023-05-09 NOTE — Progress Notes (Unsigned)
Gina Repress, MD [address]  [address]  [place], [place] [address]  Main: [phone]  Fax: [phone]    Gastroenterology Consultation  Referring Provider:     Armando Gang, FNP Primary Care Physician:  Armando Gang, FNP Primary Gastroenterologist:  Dr. Arlyss Sanchez Reason for Consultation: Immune mediated colitis        HPI:   Gina Sanchez is a 57 y.o. 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 The patient has a history of metastatic lung cancer with bone, lymph, adrenal metastasis.  Follows with Dr. Smith Robert in oncology.  Commenced on Keytruda on 01/01/2022.  Patient developed severe bloody diarrhea, found to have moderate to severe acute pancolitis as well as C. difficile infection.  Patient was treated for C. difficile infection.  Her diarrhea persisted, commenced on prednisone then transitioned to [facility] when she was diagnosed with immune mediated colitis.  Rande Lawman has been discontinued.  She had recurrent C. difficile infections, treated with vancomycin and Dificid.  She was on Entyvio every 8 weeks until December 2023, biopsies revealed chronic colitis with moderate activity, therefore Entyvio has been increased to every 4 weeks.  Patient reports having 1-2 formed to semiformed bowel movements daily. Denies any rectal bleeding.  She is overall pleased with improvement in her GI symptoms.  Patient is accompanied by her husband today.  Patient is currently treated for metastatic lung cancer, referred to [facility] to enroll into clinical trials She lost weight recently, and slowly regaining her weight back.  She did have progression of lung cancer, chemotherapy has been changed  NSAIDs: None  Antiplts/Anticoagulants/Anti thrombotics: None  GI Procedures:  Colonoscopy 08/17/2022 DIAGNOSIS:  A. COLON, RIGHT; COLD BIOPSY:  - CHRONIC COLITIS WITH MODERATE ACTIVITY (CRYPTITIS AND CRYPT  ABSCESSES).  -  NEGATIVE FOR GRANULOMA, DYSPLASIA, AND MALIGNANCY.   B. COLON, LEFT; COLD BIOPSY:  - CHRONIC COLITIS WITH MODERATE ACTIVITY (CRYPTITIS AND CRYPT  ABSCESSES).  - NEGATIVE FOR GRANULOMA, DYSPLASIA, AND MALIGNANCY.   Colonoscopy 06/11/2022 DIAGNOSIS: A.  COLON, CECUM AND ASCENDING; COLD BIOPSY: - TUBULAR ADENOMA (1). - PATCHY MILD CHRONIC ACTIVE COLITIS, SEE COMMENT. - NEGATIVE FOR HIGH-GRADE DYSPLASIA AND MALIGNANCY.  B.  COLON, TRANSVERSE; COLD BIOPSY: - MODERATE CHRONIC ACTIVE COLITIS WITH INCREASED APOPTOTIC CRYPT DEBRIS, SEE COMMENT. - IHC FOR CMV IS NEGATIVE. - NEGATIVE FOR GRANULOMAS, DYSPLASIA, AND MALIGNANCY.  C.  COLON, DESCENDING; COLD BIOPSY: - MODERATE TO MARKED CHRONIC ACTIVE COLITIS WITH INCREASED APOPTOTIC CRYPT DEBRIS, SEE COMMENT. - IHC FOR CMV IS NEGATIVE. - NEGATIVE FOR GRANULOMAS, DYSPLASIA, AND MALIGNANCY.  D.  COLON, SIGMOID; COLD BIOPSY: - MODERATE TO MARKED CHRONIC ACTIVE COLITIS WITH INCREASED APOPTOTIC CRYPT DEBRIS, SEE COMMENT. - IHC FOR CMV IS NEGATIVE. - NEGATIVE FOR GRANULOMAS, DYSPLASIA, AND MALIGNANCY.    Colonoscopy 01/06/2022 - Segmental moderate inflammation was found in the rectum, in the recto-sigmoid colon, in the sigmoid colon, in the ascending colon and in the cecum secondary to colitis. - The distal rectum and anal verge are normal on retroflexion view. - Diverticulosis in the entire examined colon.  DIAGNOSIS:  A.  TERMINAL ILEUM; BIOPSIES:  - SMALL BOWEL MUCOSA WITH NORMAL VILLOUS ARCHITECTURE AND SCATTERED MILD  LYMPHOID HYPERPLASIA.  - NO EVIDENCE OF SIGNIFICANT ATYPIA.   B.  COLON, ASCENDING; BIOPSIES;  - MODERATE ACUTE COLITIS (SEE COMMENT)   C.  COLON, TRANSVERSE, BIOPSIES;  - FOCAL SEVERE ACUTE COLITIS (SEE COMMENT).   D.  COLON, DESCENDING; BIOPSIES:  COLONIC MUCOSA WITH NO SPECIFIC HISTOLOGIC ABNORMALITY.   E.  COLON, SIGMOID; BIOPSIES:  - COLONIC MUCOSA WITH MODERATE TO SEVERE ACUTE COLITIS (SEE COMMENT).   F.   RECTUM; BIOPSIES:  - DIFFUSE SEVERE ACUTE COLITIS (SEE COMMENT).  Comment: Biopsies from the ascending transverse sigmoid and rectum show  diffuse increased acute and chronic inflammation with numerous areas of  cryptitis and crypt abscess.  There is miniaturization of crypts.  Significant complex architectural branching is not seen.  There is  marked reactive nuclear change but no definite dysplasia. There are  occasional cells with prominent eosinophilic nucleoli. The pattern  present is nonspecific but has features that have been associated with  Keytruda induced colitis (or other medication effects).  An acute  ischemic colitis would also be in the differential.  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   Palpitations    Precordial chest pain    Wrist fracture 03/2021   left    Past Surgical History:  Procedure Laterality Date   BREAST BIOPSY Right 2017   benign   CERVICAL BIOPSY  W/ LOOP ELECTRODE EXCISION     COLONOSCOPY  06/11/2022   Procedure: COLONOSCOPY;  Surgeon: Toney Reil, MD;  Location: [facility] ENDOSCOPY;  Service: Gastroenterology;;   COLONOSCOPY WITH PROPOFOL N/A 07/03/2015   Procedure: COLONOSCOPY WITH PROPOFOL;  Surgeon: Wallace Cullens, MD;  Location: [facility] ENDOSCOPY;  Service: Gastroenterology;  Laterality: N/A;   COLONOSCOPY WITH PROPOFOL N/A 01/06/2022   Procedure: COLONOSCOPY WITH PROPOFOL;  Surgeon: Toney Reil, MD;  Location: [facility] ENDOSCOPY;  Service: Gastroenterology;  Laterality: N/A;   ESOPHAGOGASTRODUODENOSCOPY (EGD) WITH PROPOFOL N/A 07/03/2015   Procedure: ESOPHAGOGASTRODUODENOSCOPY (EGD) WITH PROPOFOL;  Surgeon: Wallace Cullens, MD;  Location: [facility] ENDOSCOPY;  Service: Gastroenterology;  Laterality: N/A;   FLEXIBLE  SIGMOIDOSCOPY N/A 03/12/2022   Procedure: FLEXIBLE SIGMOIDOSCOPY;  Surgeon: Toney Reil, MD;  Location: [facility] ENDOSCOPY;  Service: Gastroenterology;  Laterality: N/A;   FLEXIBLE SIGMOIDOSCOPY N/A 08/17/2022   Procedure: FLEXIBLE SIGMOIDOSCOPY;  Surgeon: Toney Reil, MD;  Location: [facility] ENDOSCOPY;  Service: Gastroenterology;  Laterality: N/A;   FRACTURE SURGERY     INTRAMEDULLARY (IM) NAIL INTERTROCHANTERIC Right 09/15/2020   Procedure: INTRAMEDULLARY (IM) NAIL INTERTROCHANTRIC;  Surgeon: Christena Flake, MD;  Location: [facility] ORS;  Service: Orthopedics;  Laterality: Right;   IR CV LINE INJECTION  07/14/2022   IR IMAGING GUIDED PORT INSERTION  11/28/2020   LEFT HEART CATH AND CORONARY ANGIOGRAPHY N/A 03/28/2017   Procedure: Left Heart Cath and Coronary Angiography;  Surgeon: Runell Gess, MD;  Location: [facility] INVASIVE CV LAB;  Service: Cardiovascular;  Laterality: N/A;   ORIF WRIST FRACTURE Left 03/31/2021   Procedure: OPEN REDUCTION INTERNAL FIXATION (ORIF) LEFT DISTAL RADIUS FRACTURE.;  Surgeon: Christena Flake, MD;  Location: [facility] ORS;  Service: Orthopedics;  Laterality: Left;   Current Outpatient Medications:    acetaminophen (TYLENOL) 500 MG tablet, Take 500 mg by mouth every 6 (six) hours as needed., Disp: , Rfl:    ALPRAZolam (XANAX) 0.5 MG tablet, Take 1 tablet (0.5 mg total) by mouth daily as needed for anxiety. TAKE ONE TABLET (0.5 MG) BY MOUTH EVERY DAY AS NEEDED,  Disp: 30 tablet, Rfl: 0   citalopram (CELEXA) 10 MG tablet, Take 1 tablet (10 mg total) by mouth daily., Disp: 30 tablet, Rfl: 3   fentaNYL (DURAGESIC) 25 MCG/HR, Place 1 patch onto the skin every 3 (three) days., Disp: 10 patch, Rfl: 0   gabapentin (NEURONTIN) 300 MG capsule, Take 1 capsule (300 mg total) by mouth 3 (three) times daily., Disp: 90 capsule, Rfl: 3   hydrocortisone (CORTEF) 10 MG tablet, TAKE 1 TABLET BY MOUTH NIGHTLY, Disp: 30 tablet, Rfl: 3   hydrocortisone (CORTEF) 20 MG tablet, TAKE 1 TABLET BY  MOUTH EVERY MORNING, Disp: 30 tablet, Rfl: 3   HYDROmorphone (DILAUDID) 2 MG tablet, Take 1-2 tablets (2-4 mg total) by mouth every 4 (four) hours as needed for severe pain., Disp: 120 tablet, Rfl: 0   losartan (COZAAR) 50 MG tablet, Take 50 mg by mouth daily., Disp: , Rfl:    OLANZapine (ZYPREXA) 5 MG tablet, Take 1 tablet (5 mg total) by mouth at bedtime., Disp: 30 tablet, Rfl: 1   ondansetron (ZOFRAN) 4 MG tablet, TAKE 1 TABLET BY MOUTH EVERY 8 HOURS AS NEEDED FOR NAUSEA AND VOMITING (Patient taking differently: Take 4 mg by mouth every 8 (eight) hours as needed for nausea or vomiting. TAKE 1 TABLET BY MOUTH EVERY 8 HOURS AS NEEDED FOR NAUSEA AND VOMITING), Disp: 20 tablet, Rfl: 1   prochlorperazine (COMPAZINE) 10 MG tablet, Take 1 tablet (10 mg total) by mouth every 6 (six) hours as needed for nausea or vomiting., Disp: 30 tablet, Rfl: 1   Vedolizumab (ENTYVIO IV), Inject 1 Dose into the vein as directed. Every 4 weeks at her home, Disp: , Rfl:  No current facility-administered medications for this visit.  Facility-Administered Medications Ordered in Other Visits:    sodium chloride flush (NS) 0.9 % injection 10 mL, 10 mL, Intravenous, PRN, Creig Hines, MD, 10 mL at 03/09/21 0906    Family History  Problem Relation Age of Onset   Diabetes Mother        alive @ 68   Goiter Mother    Heart disease Father        died of MI @ 81   Osteoporosis Maternal Grandmother    Colon cancer Maternal Grandfather    Breast cancer Neg Hx    Ovarian cancer Neg Hx      Social History   Tobacco Use   Smoking status: Former    Current packs/day: 0.00    Average packs/day: 1 pack/day for 30.0 years (30.0 ttl pk-yrs)    Types: Cigarettes    Start date: 09/17/1990    Quit date: 09/17/2020    Years since quitting: 2.6   Smokeless tobacco: Never  Vaping Use   Vaping status: Never Used  Substance Use Topics   Alcohol use: Not Currently    Alcohol/week: 1.0 standard drink of alcohol    Types: 1  Standard drinks or equivalent per week    Comment: beer occ.   Drug use: No    Allergies as of 05/09/2023 - Review Complete 05/09/2023  Allergen Reaction Noted   Factive [gemifloxacin] Rash 07/03/2015    Review of Systems:    All systems reviewed and negative except where noted in HPI.   Physical Exam:  BP 102/69 (BP Location: Left Arm, Patient Position: Sitting, Cuff Size: Normal)   Pulse (!) 120   Temp 97.7 F (36.5 C) (Oral)   Ht 5\' 6"  (1.676 m)   Wt 178 lb 8 oz (81  kg)   LMP 09/15/2018   BMI 28.81 kg/m  Patient's last menstrual period was 09/15/2018.  General:   Alert,  Well-developed, well-nourished, pleasant and cooperative in NAD Head:  Normocephalic and atraumatic. Eyes:  Sclera clear, no icterus.   Conjunctiva pink. Ears:  Normal auditory acuity. Nose:  No deformity, discharge, or lesions. Mouth:  No deformity or lesions,oropharynx pink & moist. Neck:  Supple; no masses or thyromegaly. Lungs:  Respirations even and unlabored.  Clear throughout to auscultation.   No wheezes, crackles, or rhonchi. No acute distress. Heart:  Regular rate and rhythm; no murmurs, clicks, rubs, or gallops. Abdomen:  Normal bowel sounds. Soft, non-tender and non-distended without masses, hepatosplenomegaly or hernias noted.  No guarding or rebound tenderness.   Rectal: Not performed Msk:  Symmetrical without gross deformities. Good, equal movement & strength bilaterally. Pulses:  Normal pulses noted. Extremities:  No clubbing or edema.  No cyanosis. Neurologic:  Alert and oriented x3;  grossly normal neurologically. Skin:  Intact without significant lesions or rashes. No jaundice. Psych:  Alert and cooperative. Normal mood and affect.  Imaging Studies: Reviewed  Assessment and Plan:   NOELL ZEBLEY is a 57 y.o. pleasant Caucasian female with history of metastatic adenocarcinoma of the lung, previously on Keytruda which has been discontinued due to immune mediated colitis in  02/2022.  She also had recurrent C. difficile infection treated with vancomycin as well as Dificid.  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

## 2023-05-10 ENCOUNTER — Encounter: Payer: Self-pay | Admitting: Gastroenterology

## 2023-05-10 DIAGNOSIS — J069 Acute upper respiratory infection, unspecified: Secondary | ICD-10-CM | POA: Diagnosis not present

## 2023-05-10 DIAGNOSIS — Z1159 Encounter for screening for other viral diseases: Secondary | ICD-10-CM | POA: Diagnosis not present

## 2023-05-10 DIAGNOSIS — R509 Fever, unspecified: Secondary | ICD-10-CM | POA: Diagnosis not present

## 2023-05-10 DIAGNOSIS — K523 Indeterminate colitis: Secondary | ICD-10-CM | POA: Diagnosis not present

## 2023-05-10 DIAGNOSIS — K5190 Ulcerative colitis, unspecified, without complications: Secondary | ICD-10-CM | POA: Diagnosis not present

## 2023-05-10 DIAGNOSIS — Z1152 Encounter for screening for COVID-19: Secondary | ICD-10-CM | POA: Diagnosis not present

## 2023-05-10 DIAGNOSIS — C3491 Malignant neoplasm of unspecified part of right bronchus or lung: Secondary | ICD-10-CM | POA: Diagnosis not present

## 2023-05-10 DIAGNOSIS — R051 Acute cough: Secondary | ICD-10-CM | POA: Diagnosis not present

## 2023-05-11 ENCOUNTER — Ambulatory Visit: Payer: 59

## 2023-05-11 ENCOUNTER — Other Ambulatory Visit: Payer: 59

## 2023-05-11 ENCOUNTER — Ambulatory Visit: Payer: 59 | Admitting: Oncology

## 2023-05-18 ENCOUNTER — Encounter: Payer: Self-pay | Admitting: Oncology

## 2023-05-20 ENCOUNTER — Other Ambulatory Visit: Payer: Self-pay | Admitting: *Deleted

## 2023-05-20 MED ORDER — FENTANYL 25 MCG/HR TD PT72: 1.0000 | MEDICATED_PATCH | TRANSDERMAL | 0 refills | Status: DC

## 2023-05-23 ENCOUNTER — Inpatient Hospital Stay: Payer: 59

## 2023-05-23 ENCOUNTER — Inpatient Hospital Stay: Payer: 59 | Admitting: Oncology

## 2023-05-23 ENCOUNTER — Inpatient Hospital Stay: Payer: 59 | Attending: Oncology

## 2023-05-23 ENCOUNTER — Encounter: Payer: Self-pay | Admitting: Oncology

## 2023-05-23 VITALS — BP 107/81 | HR 116 | Temp 98.8°F | Resp 18 | Ht 66.0 in | Wt 179.2 lb

## 2023-05-23 DIAGNOSIS — C3491 Malignant neoplasm of unspecified part of right bronchus or lung: Secondary | ICD-10-CM

## 2023-05-23 DIAGNOSIS — R350 Frequency of micturition: Secondary | ICD-10-CM

## 2023-05-23 DIAGNOSIS — R300 Dysuria: Secondary | ICD-10-CM

## 2023-05-23 DIAGNOSIS — R309 Painful micturition, unspecified: Secondary | ICD-10-CM | POA: Diagnosis not present

## 2023-05-23 DIAGNOSIS — Z79899 Other long term (current) drug therapy: Secondary | ICD-10-CM | POA: Diagnosis not present

## 2023-05-23 DIAGNOSIS — Z87891 Personal history of nicotine dependence: Secondary | ICD-10-CM | POA: Insufficient documentation

## 2023-05-23 DIAGNOSIS — E2740 Unspecified adrenocortical insufficiency: Secondary | ICD-10-CM | POA: Insufficient documentation

## 2023-05-23 DIAGNOSIS — C7951 Secondary malignant neoplasm of bone: Secondary | ICD-10-CM | POA: Insufficient documentation

## 2023-05-23 DIAGNOSIS — Z5112 Encounter for antineoplastic immunotherapy: Secondary | ICD-10-CM | POA: Diagnosis not present

## 2023-05-23 DIAGNOSIS — G893 Neoplasm related pain (acute) (chronic): Secondary | ICD-10-CM

## 2023-05-23 DIAGNOSIS — Z7983 Long term (current) use of bisphosphonates: Secondary | ICD-10-CM | POA: Diagnosis not present

## 2023-05-23 DIAGNOSIS — Z5181 Encounter for therapeutic drug level monitoring: Secondary | ICD-10-CM

## 2023-05-23 DIAGNOSIS — K529 Noninfective gastroenteritis and colitis, unspecified: Secondary | ICD-10-CM | POA: Insufficient documentation

## 2023-05-23 DIAGNOSIS — K219 Gastro-esophageal reflux disease without esophagitis: Secondary | ICD-10-CM | POA: Diagnosis not present

## 2023-05-23 DIAGNOSIS — I10 Essential (primary) hypertension: Secondary | ICD-10-CM | POA: Insufficient documentation

## 2023-05-23 LAB — CBC WITH DIFFERENTIAL (CANCER CENTER ONLY)
Abs Immature Granulocytes: 0.06 10*3/uL (ref 0.00–0.07)
Basophils Absolute: 0 10*3/uL (ref 0.0–0.1)
Basophils Relative: 0 %
Eosinophils Absolute: 0.2 10*3/uL (ref 0.0–0.5)
Eosinophils Relative: 1 %
HCT: 31.7 % — ABNORMAL LOW (ref 36.0–46.0)
Hemoglobin: 9.7 g/dL — ABNORMAL LOW (ref 12.0–15.0)
Immature Granulocytes: 1 %
Lymphocytes Relative: 6 %
Lymphs Abs: 0.8 10*3/uL (ref 0.7–4.0)
MCH: 29 pg (ref 26.0–34.0)
MCHC: 30.6 g/dL (ref 30.0–36.0)
MCV: 94.6 fL (ref 80.0–100.0)
Monocytes Absolute: 0.7 10*3/uL (ref 0.1–1.0)
Monocytes Relative: 6 %
Neutro Abs: 10.4 10*3/uL — ABNORMAL HIGH (ref 1.7–7.7)
Neutrophils Relative %: 86 %
Platelet Count: 216 10*3/uL (ref 150–400)
RBC: 3.35 MIL/uL — ABNORMAL LOW (ref 3.87–5.11)
RDW: 20.2 % — ABNORMAL HIGH (ref 11.5–15.5)
WBC Count: 12.2 10*3/uL — ABNORMAL HIGH (ref 4.0–10.5)
nRBC: 0 % (ref 0.0–0.2)

## 2023-05-23 LAB — CMP (CANCER CENTER ONLY)
ALT: 14 U/L (ref 0–44)
AST: 18 U/L (ref 15–41)
Albumin: 3.1 g/dL — ABNORMAL LOW (ref 3.5–5.0)
Alkaline Phosphatase: 170 U/L — ABNORMAL HIGH (ref 38–126)
Anion gap: 8 (ref 5–15)
BUN: 12 mg/dL (ref 6–20)
CO2: 23 mmol/L (ref 22–32)
Calcium: 8.3 mg/dL — ABNORMAL LOW (ref 8.9–10.3)
Chloride: 103 mmol/L (ref 98–111)
Creatinine: 0.55 mg/dL (ref 0.44–1.00)
GFR, Estimated: >60 mL/min (ref >60)
Glucose, Bld: 136 mg/dL — ABNORMAL HIGH (ref 70–99)
Potassium: 4 mmol/L (ref 3.5–5.1)
Sodium: 134 mmol/L — ABNORMAL LOW (ref 135–145)
Total Bilirubin: 0.7 mg/dL (ref 0.3–1.2)
Total Protein: 6.7 g/dL (ref 6.5–8.1)

## 2023-05-23 LAB — PROTEIN, URINE, RANDOM: Total Protein, Urine: 25 mg/dL

## 2023-05-23 MED ORDER — SODIUM CHLORIDE 0.9 % IV SOLN
Freq: Once | INTRAVENOUS | Status: AC
  Filled 2023-05-23: qty 250

## 2023-05-23 MED ORDER — SODIUM CHLORIDE 0.9 % IV SOLN
15.0000 mg/kg | Freq: Once | INTRAVENOUS | Status: AC
  Administered 2023-05-23: 1200 mg via INTRAVENOUS
  Filled 2023-05-23: qty 48

## 2023-05-23 MED ORDER — HEPARIN SOD (PORK) LOCK FLUSH 100 UNIT/ML IV SOLN
500.0000 [IU] | Freq: Once | INTRAVENOUS | Status: AC | PRN
  Administered 2023-05-23: 500 [IU]
  Filled 2023-05-23: qty 5

## 2023-05-23 MED ORDER — ZOLEDRONIC ACID 4 MG/100ML IV SOLN
4.0000 mg | INTRAVENOUS | Status: DC
  Administered 2023-05-23: 4 mg via INTRAVENOUS
  Filled 2023-05-23: qty 100

## 2023-05-23 NOTE — Progress Notes (Signed)
Hematology/Oncology Consult note [facility]  [phone] [phone]  Patient Care Team: Armando Gang, FNP as PCP - General (Family Medicine) Glory Buff, RN as Oncology Nurse Navigator Carmina Miller, MD as Radiation Oncologist (Radiation Oncology) Vida Rigger, MD as Consulting Physician (Pulmonary Disease) Creig Hines, MD as Consulting Physician (Oncology)   Name of the patient: Gina Sanchez  [id number]  Sep 05, 1966   Date of visit: 05/23/23  Diagnosis- stage IV adenocarcinoma of the lung   Chief complaint/ Reason for visit-on treatment assessment prior to cycle 1 of maintenance Avastin chemotherapy  Heme/Onc history:  Patient is a 57 year old female with a past medical history significant for hypertension hyperlipidemia and anxiety who presented with right thigh pain and was found to have an acute right proximal femoral shaft fracture.  She underwent operative fixation on 10/12/2020.  MRI of femur showed heterogeneously enhancing osseous lesion within the area which would be nonspecific versus office neoplasm or metastatic lesion.  CT chest abdomen and pelvis with contrast showed an enlarged pretracheal lymph node 2.2 x 1.5 cm and a right paratracheal lymph node measuring 2.7 x 1.3 cm.  Prevascular node measuring 0.3 x 0.9 cm.  5 x 4 mm right middle lobe nodule.  2.8 x 1.9 cm left adrenal lesion.    Reamings from the right femur showed metastatic poorly differentiated carcinoma.  Immunohistochemistry showed was positive for pancytokeratin, CK7 and patchy CK20 with patchy dim expression of TTF-1.  Cells negative for Melan-A, CDX2, PAX8, Napsin A, GATA3, p40, CD56, p16 and thyroglobulin.  Findings compatible with metastatic carcinoma but because of decalcification immunohistochemical staining is unreliable.  Patchy dim staining with TTF-1 suspicious for lung primary but not a definitive diagnosis.   Repeat supraclavicular lymph  node biopsy showed metastatic adenocarcinoma.  Tumor cells positive for CK7 with focal weak staining for TTF-1.  Suggestive of lung origin in the proper clinical context.  Cells were negative for GATA3 PAX8 CDX2 and CK20 and Napsin A.  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  She is currently on hydrocortisone twice daily.  Thyroid functions presently are normal     Patient was treated with Ledell Noss Alimta Keytruda chemotherapy first-line followed by maintenance Alimta and Keytruda.  She developed autoimmune colitis secondary to [facility] and therefore that was stopped.  She is on Entyvio and follows up with GI for her autoimmune colitis.  Most recent regimen was maintenance Alimta. Disease progression in Jan 2024. Patients started on sotorasib.  Presently on full dose 960 mg daily.  Disease progression in May 2024.  Patient switched to CarboTaxol Avastin in June 2024    Interval history-she is doing well overall.  Appetite and weight have remained stable.  Denies any new aches and pains at this time.  Does report some burning urination over the last couple of days  ECOG PS- 1 Pain scale- 2 Opioid associated constipation- no  Review of systems- Review of Systems  Constitutional:  Positive for malaise/fatigue. Negative for chills, fever and weight loss.  HENT:  Negative for congestion, ear discharge and nosebleeds.   Eyes:  Negative for blurred vision.  Respiratory:  Negative for cough, hemoptysis, sputum production, shortness of breath and wheezing.   Cardiovascular:  Negative for chest pain, palpitations, orthopnea and claudication.  Gastrointestinal:  Negative for abdominal pain, blood in stool, constipation, diarrhea,  heartburn, melena, nausea and vomiting.  Genitourinary:  Positive for dysuria. Negative for flank pain, frequency, hematuria and urgency.   Musculoskeletal:  Negative for back pain, joint pain and myalgias.  Skin:  Negative for rash.  Neurological:  Negative for dizziness, tingling, focal weakness, seizures, weakness and headaches.  Endo/Heme/Allergies:  Does not bruise/bleed easily.  Psychiatric/Behavioral:  Negative for depression and suicidal ideas. The patient does not have insomnia.       Allergies  Allergen Reactions   Factive [Gemifloxacin] Rash     Past Medical History:  Diagnosis Date   Abnormal Pap smear of cervix    Anxiety    C. difficile colitis 05/07/2022   Chronic diarrhea    Depression    Diverticulosis    Essential hypertension    Femur fracture, right (HCC)    GERD (gastroesophageal reflux disease)    Lung cancer (HCC)    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   Palpitations    Precordial chest pain    Wrist fracture 03/2021   left     Past Surgical History:  Procedure Laterality Date   BREAST BIOPSY Right 2017   benign   CERVICAL BIOPSY  W/ LOOP ELECTRODE EXCISION     COLONOSCOPY  06/11/2022   Procedure: COLONOSCOPY;  Surgeon: Toney Reil, MD;  Location: [facility] ENDOSCOPY;  Service: Gastroenterology;;   COLONOSCOPY WITH PROPOFOL N/A 07/03/2015   Procedure: COLONOSCOPY WITH PROPOFOL;  Surgeon: Wallace Cullens, MD;  Location: [facility] ENDOSCOPY;  Service: Gastroenterology;  Laterality: N/A;   COLONOSCOPY WITH PROPOFOL N/A 01/06/2022   Procedure: COLONOSCOPY WITH PROPOFOL;  Surgeon: Toney Reil, MD;  Location: [facility] ENDOSCOPY;  Service: Gastroenterology;  Laterality: N/A;   ESOPHAGOGASTRODUODENOSCOPY (EGD) WITH PROPOFOL N/A 07/03/2015   Procedure: ESOPHAGOGASTRODUODENOSCOPY (EGD) WITH PROPOFOL;  Surgeon: Wallace Cullens, MD;  Location: [facility] ENDOSCOPY;  Service: Gastroenterology;  Laterality: N/A;   FLEXIBLE SIGMOIDOSCOPY N/A 03/12/2022   Procedure: FLEXIBLE SIGMOIDOSCOPY;  Surgeon: Toney Reil, MD;  Location: [facility] ENDOSCOPY;  Service: Gastroenterology;  Laterality: N/A;   FLEXIBLE  SIGMOIDOSCOPY N/A 08/17/2022   Procedure: FLEXIBLE SIGMOIDOSCOPY;  Surgeon: Toney Reil, MD;  Location: [facility] ENDOSCOPY;  Service: Gastroenterology;  Laterality: N/A;   FRACTURE SURGERY     INTRAMEDULLARY (IM) NAIL INTERTROCHANTERIC Right 09/15/2020   Procedure: INTRAMEDULLARY (IM) NAIL INTERTROCHANTRIC;  Surgeon: Christena Flake, MD;  Location: [facility] ORS;  Service: Orthopedics;  Laterality: Right;   IR CV LINE INJECTION  07/14/2022   IR IMAGING GUIDED PORT INSERTION  11/28/2020   LEFT HEART CATH AND CORONARY ANGIOGRAPHY N/A 03/28/2017   Procedure: Left Heart Cath and Coronary Angiography;  Surgeon: Runell Gess, MD;  Location: [facility] INVASIVE CV LAB;  Service: Cardiovascular;  Laterality: N/A;   ORIF WRIST FRACTURE Left 03/31/2021   Procedure: OPEN REDUCTION INTERNAL FIXATION (ORIF) LEFT DISTAL RADIUS FRACTURE.;  Surgeon: Christena Flake, MD;  Location: [facility] ORS;  Service: Orthopedics;  Laterality: Left;    Social History   Socioeconomic History   Marital status: Married    Spouse name: Gene   Number of children: 2   Years of education: Not on file   Highest education level: Not on file  Occupational History   Not on file  Tobacco Use   Smoking status: Former    Current packs/day: 0.00    Average packs/day: 1 pack/day for 30.0 years (30.0 ttl pk-yrs)    Types: Cigarettes    Start date: 09/17/1990    Quit date: 09/17/2020    Years  since quitting: 2.6   Smokeless tobacco: Never  Vaping Use   Vaping status: Never Used  Substance and Sexual Activity   Alcohol use: Not Currently    Alcohol/week: 1.0 standard drink of alcohol    Types: 1 Standard drinks or equivalent per week    Comment: beer occ.   Drug use: No   Sexual activity: Yes    Birth control/protection: Post-menopausal  Other Topics Concern   Not on file  Social History Narrative   Lives in [place] with her husband.  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   The labs and  vital signs and weight was done in infusion   Social Determinants of Health   Financial Resource Strain: Not on file  Food Insecurity: No Food Insecurity (02/11/2023)   Hunger Vital Sign    Worried About Running Out of Food in the Last Year: Never true    Ran Out of Food in the Last Year: Never true  Transportation Needs: No Transportation Needs (02/11/2023)   PRAPARE - Administrator, Civil Service (Medical): No    Lack of Transportation (Non-Medical): No  Physical Activity: Not on file  Stress: Not on file  Social Connections: Not on file  Intimate Partner Violence: Not At Risk (02/11/2023)   Humiliation, Afraid, Rape, and Kick questionnaire    Fear of Current or Ex-Partner: No    Emotionally Abused: No    Physically Abused: No    Sexually Abused: No    Family History  Problem Relation Age of Onset   Diabetes Mother        alive @ 68   Goiter Mother    Heart disease Father        died of MI @ 86   Osteoporosis Maternal Grandmother    Colon cancer Maternal Grandfather    Breast cancer Neg Hx    Ovarian cancer Neg Hx      Current Outpatient Medications:    acetaminophen (TYLENOL) 500 MG tablet, Take 500 mg by mouth every 6 (six) hours as needed., Disp: , Rfl:    ALPRAZolam (XANAX) 0.5 MG tablet, Take 1 tablet (0.5 mg total) by mouth daily as needed for anxiety. TAKE ONE TABLET (0.5 MG) BY MOUTH EVERY DAY AS NEEDED, Disp: 30 tablet, Rfl: 0   citalopram (CELEXA) 10 MG tablet, Take 1 tablet (10 mg total) by mouth daily., Disp: 30 tablet, Rfl: 3   fentaNYL (DURAGESIC) 25 MCG/HR, Place 1 patch onto the skin every 3 (three) days., Disp: 10 patch, Rfl: 0   gabapentin (NEURONTIN) 300 MG capsule, Take 1 capsule (300 mg total) by mouth 3 (three) times daily., Disp: 90 capsule, Rfl: 3   hydrocortisone (CORTEF) 10 MG tablet, TAKE 1 TABLET BY MOUTH NIGHTLY, Disp: 30 tablet, Rfl: 3   hydrocortisone (CORTEF) 20 MG tablet, TAKE 1 TABLET BY MOUTH EVERY MORNING, Disp: 30 tablet,  Rfl: 3   HYDROmorphone (DILAUDID) 2 MG tablet, Take 1-2 tablets (2-4 mg total) by mouth every 4 (four) hours as needed for severe pain., Disp: 120 tablet, Rfl: 0   losartan (COZAAR) 50 MG tablet, Take 50 mg by mouth daily., Disp: , Rfl:    OLANZapine (ZYPREXA) 5 MG tablet, Take 1 tablet (5 mg total) by mouth at bedtime., Disp: 30 tablet, Rfl: 1   ondansetron (ZOFRAN) 4 MG tablet, TAKE 1 TABLET BY MOUTH EVERY 8 HOURS AS NEEDED FOR NAUSEA AND VOMITING (Patient taking differently: Take 4  mg by mouth every 8 (eight) hours as needed for nausea or vomiting. TAKE 1 TABLET BY MOUTH EVERY 8 HOURS AS NEEDED FOR NAUSEA AND VOMITING), Disp: 20 tablet, Rfl: 1   prochlorperazine (COMPAZINE) 10 MG tablet, Take 1 tablet (10 mg total) by mouth every 6 (six) hours as needed for nausea or vomiting., Disp: 30 tablet, Rfl: 1   Vedolizumab (ENTYVIO IV), Inject 1 Dose into the vein as directed. Every 4 weeks at her home, Disp: , Rfl:  No current facility-administered medications for this visit.  Facility-Administered Medications Ordered in Other Visits:    bevacizumab-awwb (MVASI) 1,200 mg in sodium chloride 0.9 % 100 mL chemo infusion, 15 mg/kg (Treatment Plan Recorded), Intravenous, Once, Creig Hines, MD   sodium chloride flush (NS) 0.9 % injection 10 mL, 10 mL, Intravenous, PRN, Creig Hines, MD, 10 mL at 03/09/21 0906   Zoledronic Acid (ZOMETA) IVPB 4 mg, 4 mg, Intravenous, Q28 days, Creig Hines, MD, Last Rate: 400 mL/hr at 05/23/23 1352, 4 mg at 05/23/23 1352  Physical exam:  Vitals:   05/23/23 1311  BP: 107/81  Pulse: (!) 116  Resp: 18  Temp: 98.8 F (37.1 C)  TempSrc: Tympanic  SpO2: 97%  Weight: 179 lb 3.2 oz (81.3 kg)  Height: 5\' 6"  (1.676 m)   Physical Exam Cardiovascular:     Rate and Rhythm: Normal rate and regular rhythm.     Heart sounds: Normal heart sounds.  Pulmonary:     Effort: Pulmonary effort is normal.     Breath sounds: Normal breath sounds.  Musculoskeletal:     Right  lower leg: No edema.     Left lower leg: No edema.  Skin:    General: Skin is warm and dry.  Neurological:     Mental Status: She is alert and oriented to person, place, and time.         Latest Ref Rng & Units 05/23/2023   12:46 PM  CMP  Glucose 70 - 99 mg/dL 478   BUN 6 - 20 mg/dL 12   Creatinine 2.95 - 1.00 mg/dL 6.21   Sodium 308 - 657 mmol/L 134   Potassium 3.5 - 5.1 mmol/L 4.0   Chloride 98 - 111 mmol/L 103   CO2 22 - 32 mmol/L 23   Calcium 8.9 - 10.3 mg/dL 8.3   Total Protein 6.5 - 8.1 g/dL 6.7   Total Bilirubin 0.3 - 1.2 mg/dL 0.7   Alkaline Phos 38 - 126 U/L 170   AST 15 - 41 U/L 18   ALT 0 - 44 U/L 14       Latest Ref Rng & Units 05/23/2023   12:46 PM  CBC  WBC 4.0 - 10.5 K/uL 12.2   Hemoglobin 12.0 - 15.0 g/dL 9.7   Hematocrit 84.6 - 46.0 % 31.7   Platelets 150 - 400 K/uL 216      Assessment and plan- Patient is a 57 y.o. female with history of metastatic adenocarcinoma of the lung here for on treatment assessment prior to cycle 1 of maintenance a Avastin  Patient is s/p 4 cycles of CarboTaxol Avastin chemotherapy.  Scan showed overall excellent response to treatment with resolution of axillary and hilar adenopathy.  Diminished size of peritoneal and left adrenal lymph nodes.  Somewhat of a mixed response in the bones which I will continue to monitor.  She will receive a cycle 1 of maintenance Avastin today and I will see her back in 3 weeks  for cycle 2.  Plan to repeat scans after 3 cycles of a Avastin.  I will also follow-up on the buccal referral that was meant to be sent for any treatment options down the line should she have progression on the present regimen.  Bone metastases: She will get Zometa today  Neoplasm related pain: Continue fentanyl patch and as needed oxycodone   Visit Diagnosis 1. Primary malignant neoplasm of right lung metastatic to other site [facility])   2. Frequent urination   3. Burning with urination   4. Encounter for monoclonal  antibody treatment for malignancy      Dr. Owens Shark, MD, MPH [facility] at [facility] [phone] 05/23/2023 2:17 PM

## 2023-05-23 NOTE — Patient Instructions (Signed)

## 2023-05-24 ENCOUNTER — Encounter: Payer: Self-pay | Admitting: Oncology

## 2023-05-24 ENCOUNTER — Other Ambulatory Visit: Payer: Self-pay | Admitting: *Deleted

## 2023-05-24 MED ORDER — SULFAMETHOXAZOLE-TRIMETHOPRIM 800-160 MG PO TABS: 1.0000 | ORAL_TABLET | Freq: Two times a day (BID) | ORAL | 0 refills | Status: DC

## 2023-05-24 MED ORDER — HYDROCODONE BIT-HOMATROP MBR 5-1.5 MG/5ML PO SOLN: 5.0000 mL | Freq: Four times a day (QID) | ORAL | 0 refills | Status: DC | PRN

## 2023-05-25 ENCOUNTER — Encounter: Payer: Self-pay | Admitting: *Deleted

## 2023-05-25 ENCOUNTER — Telehealth: Payer: 59

## 2023-05-25 ENCOUNTER — Other Ambulatory Visit: Payer: Self-pay

## 2023-05-25 ENCOUNTER — Telehealth: Payer: Self-pay | Admitting: *Deleted

## 2023-05-25 DIAGNOSIS — K523 Indeterminate colitis: Secondary | ICD-10-CM

## 2023-05-25 LAB — URINE CULTURE: Culture: >=100000 — AB

## 2023-05-25 NOTE — Telephone Encounter (Signed)
Submitted PA to [facility] Entyvio infusion. Submitted last office visit, labs, path, colonoscopy and flexsigmiod report. Faxed PA to [phone]. Got confirmation fax went through at 08:50am

## 2023-05-25 NOTE — Telephone Encounter (Signed)
Called [employer] to get fax number to find out what fax number the authorization needed to be faxed to. They said [phone]. I also put on the authorization needed to be filed under medical insurance not pharmacy

## 2023-05-25 NOTE — Telephone Encounter (Signed)
She was good for the meds  sent to pharmacy for her issue

## 2023-05-25 NOTE — Telephone Encounter (Signed)
I called the pt and let her know that Dr. Smith Robert said that she sent bactrim DS bid for 7 days. Also she asked about cough and wanted a cough syrup and we sent hycodan. She is ok with that.

## 2023-05-25 NOTE — Telephone Encounter (Signed)
Received a faxed from [facility] and called the provider services on the back of the card number which is [phone] because on the fax it said member was not found by name, DOB or member ID. Called and they said this has to go through the medication side which is [facility] and transfer me and gave me the number which was [phone]. They transfer me to the speciality department and she states that they can not run the PA because insurance requires patient medical insurance to do the PA. I informed her I knew this and this always happens. She states she will forward this to [place] and we will receive a fax within a hour to re send the authorization to them with there fax number.

## 2023-05-25 NOTE — Progress Notes (Signed)
Second request for second opinion with Dr. Chestine Spore faxed to [facility] at [phone].

## 2023-05-26 DIAGNOSIS — K523 Indeterminate colitis: Secondary | ICD-10-CM | POA: Diagnosis not present

## 2023-05-26 DIAGNOSIS — C3491 Malignant neoplasm of unspecified part of right bronchus or lung: Secondary | ICD-10-CM | POA: Diagnosis not present

## 2023-05-26 DIAGNOSIS — K5190 Ulcerative colitis, unspecified, without complications: Secondary | ICD-10-CM | POA: Diagnosis not present

## 2023-05-26 MED ORDER — ENTYVIO 108 MG/0.68ML ~~LOC~~ SOPN: 1.0000 | PEN_INJECTOR | SUBCUTANEOUS | 12 refills | Status: DC

## 2023-05-26 MED ORDER — ENTYVIO 108 MG/0.68ML ~~LOC~~ SOPN
1.0000 | PEN_INJECTOR | SUBCUTANEOUS | 12 refills | Status: DC
Start: 2023-05-26 — End: 2023-05-26

## 2023-05-26 NOTE — Addendum Note (Signed)
Addended by: Arlyss Repress on: 05/26/2023 03:49 PM   Modules accepted: Orders

## 2023-05-26 NOTE — Telephone Encounter (Signed)
Certificate Information Reference Number [id number]  Status NOT CERTIFIED  Review Reason 1 Duplicate Request  Message POSSIBLE DUPLICATE REQUEST PLEASE SUBMIT A PRECERT INQUIRY USING THE PREVIOUS REVIEW AUTHORIZATION NUMBER RETURNED IN THIS RESPONSE TO VIEW THE CURRENT STATUS PLEASE CALL FOR ANY READMISSIONS THAT HAVE OCCURRED WITHIN 3 DAYS FROM A PREVIOUS INPATIENT STAY  Member Information Patient Name DAKARI, BALLANTYNE  Patient Date of Birth October 10, 1965  Patient Gender Female  Member ID [id number]  Relationship to Subscriber Self  Subscriber Name Jeanmarie Plant  Requesting Provider    Name Lannette Donath  NPI [id number]  Provider Role Provider  Address 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  NPI [id number]  Provider Role Service Provider

## 2023-05-26 NOTE — Telephone Encounter (Signed)
Entyvio injection every 2 weeks  RV

## 2023-05-26 NOTE — Addendum Note (Signed)
Addended by: Radene Knee L on: 05/26/2023 04:18 PM   Modules accepted: Orders

## 2023-05-26 NOTE — Progress Notes (Signed)
Pt scheduled with Dr. Kemper Durie at [facility] on 06/14/2023 at 3pm per chart in [facility].

## 2023-05-26 NOTE — Telephone Encounter (Signed)
Per Dr. Allegra Lai she wants [place] to check with Patient to see if she wants to switch to the [facility] injection which is the same drug as the infusion but is a injection and and after she is trained how to inject it her self she will do it every 4 weeks to her self at home. Called patient and left a message for call back to find out if she is okay with this.

## 2023-05-26 NOTE — Telephone Encounter (Signed)
[facility]ed [facility] and cancel prescription there because she has to go to a speciality pharmacy. [facility]. Enrolled patient in [place] connect so patient can get injection training.  Submitted PA through cover my meds for the injection waiting on response from insurance would not let me attached clinicals

## 2023-05-26 NOTE — Telephone Encounter (Signed)
Patient is okay with switching to the [facility] Injectable please advise if this is the write script to send to the pharmacy?  It is normal for every 14 days would she need every 7 days

## 2023-05-26 NOTE — Telephone Encounter (Signed)
They filed PA on the pharmacy side and not medical side of the patient insurance. The pharmacy side denied the claim for the Gina Sanchez NPI  [id number]  address is [address], [place], [place] [address].  [facility] [address] [place], [place] [address]. Phone number is [phone] fax [phone] Gina Sanchez [phone]  Tried to call insurance today and he disconnected the phone with me tried to do it online and it gave me this. The CPT code is  J3380 and DX code is K52.3.    Can you also write a appeal letter to see if we can get the pharmacy side of the entyvio approved.   Appeal letter can be faxed to [phone] Phone urgent is [phone]

## 2023-05-26 NOTE — Addendum Note (Signed)
Addended by: Radene Knee L on: 05/26/2023 03:21 PM   Modules accepted: Orders

## 2023-05-27 ENCOUNTER — Encounter: Payer: Self-pay | Admitting: Gastroenterology

## 2023-05-27 ENCOUNTER — Telehealth: Payer: Self-pay

## 2023-05-27 NOTE — Telephone Encounter (Signed)
Insurance company denied the Miller 108mg  injections. They needed a letter to be written for a appeal and fax to [phone]. It says if the appeal is urgent then the fastest way is for the provider to call [phone] and talk to the [facility] clinical appeal unit expedited appeal. Preferred products on there list are Humira, Hyrimoz, Rinvoq, Stelara, and Xeljanz.

## 2023-05-27 NOTE — Telephone Encounter (Signed)
Informed patient that we have enrolled patient in the [facility] connect program. She states okay that sounds great and she already received a text message from them

## 2023-05-30 NOTE — Telephone Encounter (Signed)
Emailed the form back to you

## 2023-05-30 NOTE — Telephone Encounter (Signed)
Faxed appeal letter to [employer] and got confirmation that fax went through

## 2023-05-31 ENCOUNTER — Telehealth: Payer: Self-pay

## 2023-05-31 NOTE — Telephone Encounter (Signed)
[facility] is calling to let us know the Epi pen needed a PA through insurance. She sates the Key is [facility]

## 2023-05-31 NOTE — Telephone Encounter (Signed)
Submitted the PA through cover my meds

## 2023-06-01 ENCOUNTER — Other Ambulatory Visit: Payer: Self-pay | Admitting: Oncology

## 2023-06-01 ENCOUNTER — Telehealth: Payer: Self-pay

## 2023-06-01 NOTE — Telephone Encounter (Signed)
[facility] PA/ [facility]

## 2023-06-01 NOTE — Telephone Encounter (Signed)
Calling about the PA for Epi pen informed them they called yesterday and it was pending still

## 2023-06-02 ENCOUNTER — Telehealth: Payer: Self-pay

## 2023-06-02 NOTE — Telephone Encounter (Signed)
[facility] left voice mail needed information on P.A. [id number] [facility] stated they need criterion .

## 2023-06-02 NOTE — Telephone Encounter (Signed)
They also sent a fax so filled a fax and faxed it back to [facility]

## 2023-06-06 NOTE — Telephone Encounter (Signed)
Called [employer] to check on the Appeal and they state the document control number is [id number] and it is still under review. They said it could take up to 30 calendar days for them to review it

## 2023-06-14 ENCOUNTER — Other Ambulatory Visit: Payer: Self-pay | Admitting: *Deleted

## 2023-06-14 DIAGNOSIS — C3491 Malignant neoplasm of unspecified part of right bronchus or lung: Secondary | ICD-10-CM | POA: Diagnosis not present

## 2023-06-14 DIAGNOSIS — C7951 Secondary malignant neoplasm of bone: Secondary | ICD-10-CM | POA: Diagnosis not present

## 2023-06-14 NOTE — Telephone Encounter (Signed)
[employer] and the appeal is still pending they said they received the appeal on 05/27/2023 and expected decision date will be 06/26/2023. The case number is [id number]

## 2023-06-15 ENCOUNTER — Inpatient Hospital Stay: Payer: 59 | Admitting: Hospice and Palliative Medicine

## 2023-06-15 ENCOUNTER — Telehealth: Payer: Self-pay

## 2023-06-15 ENCOUNTER — Inpatient Hospital Stay: Payer: 59

## 2023-06-15 ENCOUNTER — Encounter: Payer: Self-pay | Admitting: Hospice and Palliative Medicine

## 2023-06-15 ENCOUNTER — Other Ambulatory Visit: Payer: Self-pay

## 2023-06-15 VITALS — BP 109/76 | HR 104 | Temp 96.8°F | Resp 18 | Ht 66.0 in | Wt 178.6 lb

## 2023-06-15 DIAGNOSIS — C7951 Secondary malignant neoplasm of bone: Secondary | ICD-10-CM | POA: Insufficient documentation

## 2023-06-15 DIAGNOSIS — G893 Neoplasm related pain (acute) (chronic): Secondary | ICD-10-CM | POA: Insufficient documentation

## 2023-06-15 DIAGNOSIS — I10 Essential (primary) hypertension: Secondary | ICD-10-CM | POA: Insufficient documentation

## 2023-06-15 DIAGNOSIS — E860 Dehydration: Secondary | ICD-10-CM

## 2023-06-15 DIAGNOSIS — Z5112 Encounter for antineoplastic immunotherapy: Secondary | ICD-10-CM | POA: Insufficient documentation

## 2023-06-15 DIAGNOSIS — C801 Malignant (primary) neoplasm, unspecified: Secondary | ICD-10-CM | POA: Diagnosis not present

## 2023-06-15 DIAGNOSIS — C3491 Malignant neoplasm of unspecified part of right bronchus or lung: Secondary | ICD-10-CM | POA: Diagnosis not present

## 2023-06-15 DIAGNOSIS — R110 Nausea: Secondary | ICD-10-CM

## 2023-06-15 DIAGNOSIS — Z510 Encounter for antineoplastic radiation therapy: Secondary | ICD-10-CM | POA: Diagnosis present

## 2023-06-15 DIAGNOSIS — R112 Nausea with vomiting, unspecified: Secondary | ICD-10-CM | POA: Insufficient documentation

## 2023-06-15 DIAGNOSIS — C7931 Secondary malignant neoplasm of brain: Secondary | ICD-10-CM | POA: Diagnosis not present

## 2023-06-15 DIAGNOSIS — K219 Gastro-esophageal reflux disease without esophagitis: Secondary | ICD-10-CM | POA: Insufficient documentation

## 2023-06-15 DIAGNOSIS — C7800 Secondary malignant neoplasm of unspecified lung: Secondary | ICD-10-CM | POA: Diagnosis not present

## 2023-06-15 DIAGNOSIS — C7970 Secondary malignant neoplasm of unspecified adrenal gland: Secondary | ICD-10-CM | POA: Insufficient documentation

## 2023-06-15 DIAGNOSIS — Z79899 Other long term (current) drug therapy: Secondary | ICD-10-CM | POA: Insufficient documentation

## 2023-06-15 DIAGNOSIS — C3490 Malignant neoplasm of unspecified part of unspecified bronchus or lung: Secondary | ICD-10-CM | POA: Insufficient documentation

## 2023-06-15 LAB — CBC WITH DIFFERENTIAL (CANCER CENTER ONLY)
Abs Immature Granulocytes: 0.04 10*3/uL (ref 0.00–0.07)
Basophils Absolute: 0 10*3/uL (ref 0.0–0.1)
Basophils Relative: 1 %
Eosinophils Absolute: 0.1 10*3/uL (ref 0.0–0.5)
Eosinophils Relative: 2 %
HCT: 33.2 % — ABNORMAL LOW (ref 36.0–46.0)
Hemoglobin: 10.4 g/dL — ABNORMAL LOW (ref 12.0–15.0)
Immature Granulocytes: 1 %
Lymphocytes Relative: 17 %
Lymphs Abs: 1.1 10*3/uL (ref 0.7–4.0)
MCH: 28.5 pg (ref 26.0–34.0)
MCHC: 31.3 g/dL (ref 30.0–36.0)
MCV: 91 fL (ref 80.0–100.0)
Monocytes Absolute: 0.7 10*3/uL (ref 0.1–1.0)
Monocytes Relative: 10 %
Neutro Abs: 4.6 10*3/uL (ref 1.7–7.7)
Neutrophils Relative %: 69 %
Platelet Count: 304 10*3/uL (ref 150–400)
RBC: 3.65 MIL/uL — ABNORMAL LOW (ref 3.87–5.11)
RDW: 15.9 % — ABNORMAL HIGH (ref 11.5–15.5)
WBC Count: 6.5 10*3/uL (ref 4.0–10.5)
nRBC: 0 % (ref 0.0–0.2)

## 2023-06-15 LAB — CMP (CANCER CENTER ONLY)
ALT: 12 U/L (ref 0–44)
AST: 20 U/L (ref 15–41)
Albumin: 3.1 g/dL — ABNORMAL LOW (ref 3.5–5.0)
Alkaline Phosphatase: 116 U/L (ref 38–126)
Anion gap: 11 (ref 5–15)
BUN: 16 mg/dL (ref 6–20)
CO2: 22 mmol/L (ref 22–32)
Calcium: 8.8 mg/dL — ABNORMAL LOW (ref 8.9–10.3)
Chloride: 101 mmol/L (ref 98–111)
Creatinine: 0.69 mg/dL (ref 0.44–1.00)
GFR, Estimated: >60 mL/min (ref >60)
Glucose, Bld: 109 mg/dL — ABNORMAL HIGH (ref 70–99)
Potassium: 3.9 mmol/L (ref 3.5–5.1)
Sodium: 134 mmol/L — ABNORMAL LOW (ref 135–145)
Total Bilirubin: 0.4 mg/dL (ref 0.3–1.2)
Total Protein: 7.5 g/dL (ref 6.5–8.1)

## 2023-06-15 LAB — MAGNESIUM: Magnesium: 2 mg/dL (ref 1.7–2.4)

## 2023-06-15 MED ORDER — SODIUM CHLORIDE 0.9% FLUSH
10.0000 mL | Freq: Once | INTRAVENOUS | Status: AC
  Administered 2023-06-15: 10 mL via INTRAVENOUS
  Filled 2023-06-15: qty 10

## 2023-06-15 MED ORDER — ONDANSETRON HCL 4 MG/2ML IJ SOLN
8.0000 mg | Freq: Once | INTRAMUSCULAR | Status: AC
  Administered 2023-06-15: 8 mg via INTRAVENOUS
  Filled 2023-06-15: qty 4

## 2023-06-15 MED ORDER — SODIUM CHLORIDE 0.9 % IV SOLN
INTRAVENOUS | Status: DC
  Filled 2023-06-15 (×2): qty 250

## 2023-06-15 MED ORDER — HEPARIN SOD (PORK) LOCK FLUSH 100 UNIT/ML IV SOLN
500.0000 [IU] | Freq: Once | INTRAVENOUS | Status: AC
  Administered 2023-06-15: 500 [IU] via INTRAVENOUS
  Filled 2023-06-15: qty 5

## 2023-06-15 NOTE — Telephone Encounter (Signed)
Pt was calling to speak with you. Please return call.

## 2023-06-15 NOTE — Progress Notes (Signed)
Symptom Management Clinic [facility] [phone] [phone]  Patient Care Team: Gina Gang, FNP as PCP - General (Family Medicine) Gina Buff, RN as Oncology Nurse Navigator Gina Miller, MD as Radiation Oncologist (Radiation Oncology) Gina Rigger, MD as Consulting Physician (Pulmonary Disease) Gina Hines, MD as Consulting Physician (Oncology)   NAME OF PATIENT: Gina Sanchez  [id number]  Sep 29, 1965   DATE OF VISIT: 06/15/23  REASON FOR CONSULT: Gina Sanchez is a 57 y.o. 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    INTERVAL HISTORY: Patient was an add-on to [facility] today for evaluation of nausea.   Patient last saw Dr. Smith Sanchez on 05/23/2023.  Patient status post 4 cycles of CarboTaxol Avastin chemotherapy with response demonstrated on CTs.  Patient received cycle 1 maintenance Avastin on 05/23/2023.  Patient reports intermittent vomiting.  Feels like her intake has been poor.  Denies diarrhea or constipation.  No abdominal pain.  No fever or chills.  No shortness of breath or chest pain. Denies urinary complaints. Patient offers no further specific complaints today.  Patient has been taking ondansetron.  Felt like olanzapine helped at bedtime as requested dose adjustment.   PAST MEDICAL HISTORY: Past Medical History:  Diagnosis Date   Abnormal Pap smear of cervix    Anxiety    C. difficile colitis 05/07/2022   Chronic diarrhea    Depression    Diverticulosis    Essential hypertension    Femur fracture, right (HCC)    GERD (gastroesophageal reflux disease)    Lung cancer (HCC)    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   Palpitations    Precordial chest pain    Wrist fracture 03/2021   left    PAST SURGICAL HISTORY:  Past Surgical History:  Procedure Laterality Date   BREAST BIOPSY Right 2017   benign    CERVICAL BIOPSY  W/ LOOP ELECTRODE EXCISION     COLONOSCOPY  06/11/2022   Procedure: COLONOSCOPY;  Surgeon: Toney Reil, MD;  Location: [facility] ENDOSCOPY;  Service: Gastroenterology;;   COLONOSCOPY WITH PROPOFOL N/A 07/03/2015   Procedure: COLONOSCOPY WITH PROPOFOL;  Surgeon: Wallace Cullens, MD;  Location: [facility] ENDOSCOPY;  Service: Gastroenterology;  Laterality: N/A;   COLONOSCOPY WITH PROPOFOL N/A 01/06/2022   Procedure: COLONOSCOPY WITH PROPOFOL;  Surgeon: Toney Reil, MD;  Location: [facility] ENDOSCOPY;  Service: Gastroenterology;  Laterality: N/A;   ESOPHAGOGASTRODUODENOSCOPY (EGD) WITH PROPOFOL N/A 07/03/2015   Procedure: ESOPHAGOGASTRODUODENOSCOPY (EGD) WITH PROPOFOL;  Surgeon: Wallace Cullens, MD;  Location: [facility] ENDOSCOPY;  Service: Gastroenterology;  Laterality: N/A;   FLEXIBLE SIGMOIDOSCOPY N/A 03/12/2022   Procedure: FLEXIBLE SIGMOIDOSCOPY;  Surgeon: Toney Reil, MD;  Location: [facility] ENDOSCOPY;  Service: Gastroenterology;  Laterality: N/A;   FLEXIBLE SIGMOIDOSCOPY N/A 08/17/2022   Procedure: FLEXIBLE SIGMOIDOSCOPY;  Surgeon: Toney Reil, MD;  Location: [facility] ENDOSCOPY;  Service: Gastroenterology;  Laterality: N/A;   FRACTURE SURGERY     INTRAMEDULLARY (IM) NAIL INTERTROCHANTERIC Right 09/15/2020   Procedure: INTRAMEDULLARY (IM) NAIL INTERTROCHANTRIC;  Surgeon: Christena Flake, MD;  Location: [facility] ORS;  Service: Orthopedics;  Laterality: Right;   IR CV LINE INJECTION  07/14/2022   IR IMAGING GUIDED PORT INSERTION  11/28/2020   LEFT HEART CATH AND CORONARY ANGIOGRAPHY N/A 03/28/2017   Procedure: Left Heart Cath and Coronary Angiography;  Surgeon: Runell Gess, MD;  Location: [facility] INVASIVE CV LAB;  Service: Cardiovascular;  Laterality: N/A;   ORIF WRIST FRACTURE Left 03/31/2021   Procedure: OPEN REDUCTION INTERNAL FIXATION (ORIF) LEFT DISTAL RADIUS FRACTURE.;  Surgeon: Christena Flake, MD;  Location: [facility] ORS;  Service: Orthopedics;  Laterality: Left;    HEMATOLOGY/ONCOLOGY  HISTORY:  Oncology History  Metastatic lung cancer (metastasis from lung to other site) [facility])  10/19/2020 Initial Diagnosis   Metastatic lung cancer (metastasis from lung to other site) [facility])   10/19/2020 Cancer Staging   Staging form: Lung, AJCC 8th Edition - Clinical: Stage IV (cT1, cN3, pM1) - Signed by Gina Hines, MD on 10/19/2020   10/27/2020 - 02/12/2022 Chemotherapy   Patient is on Treatment Plan : LUNG NSCLC Pemetrexed (Alimta) / Carboplatin q21d x 1 cycles     06/08/2022 - 09/15/2022 Chemotherapy   Patient is on Treatment Plan : LUNG NSCLC Pemetrexed + Carboplatin q21d x 4 Cycles     02/17/2023 -  Chemotherapy   Patient is on Treatment Plan : LUNG NSCLC Carboplatin + Paclitaxel + Bevacizumab q21d       ALLERGIES:  is allergic to factive [gemifloxacin].  MEDICATIONS:  Current Outpatient Medications  Medication Sig Dispense Refill   acetaminophen (TYLENOL) 500 MG tablet Take 500 mg by mouth every 6 (six) hours as needed.     ALPRAZolam (XANAX) 0.5 MG tablet TAKE 1 TABLET BY MOUTH DAILY AS NEEDED FOR ANXIETY. 30 tablet 0   citalopram (CELEXA) 10 MG tablet Take 1 tablet (10 mg total) by mouth daily. 30 tablet 3   fentaNYL (DURAGESIC) 25 MCG/HR Place 1 patch onto the skin every 3 (three) days. 10 patch 0   gabapentin (NEURONTIN) 300 MG capsule Take 1 capsule (300 mg total) by mouth 3 (three) times daily. 90 capsule 3   hydrocortisone (CORTEF) 10 MG tablet TAKE 1 TABLET BY MOUTH NIGHTLY 30 tablet 3   hydrocortisone (CORTEF) 20 MG tablet TAKE 1 TABLET BY MOUTH EVERY MORNING 30 tablet 3   HYDROmorphone (DILAUDID) 2 MG tablet Take 1-2 tablets (2-4 mg total) by mouth every 4 (four) hours as needed for severe pain. 120 tablet 0   losartan (COZAAR) 50 MG tablet Take 50 mg by mouth daily.     OLANZapine (ZYPREXA) 5 MG tablet Take 1 tablet (5 mg total) by mouth at bedtime. 30 tablet 1   ondansetron (ZOFRAN) 4 MG tablet TAKE 1 TABLET BY MOUTH EVERY 8 HOURS AS NEEDED FOR NAUSEA AND VOMITING  (Patient taking differently: Take 4 mg by mouth every 8 (eight) hours as needed for nausea or vomiting. TAKE 1 TABLET BY MOUTH EVERY 8 HOURS AS NEEDED FOR NAUSEA AND VOMITING) 20 tablet 1   prochlorperazine (COMPAZINE) 10 MG tablet Take 1 tablet (10 mg total) by mouth every 6 (six) hours as needed for nausea or vomiting. 30 tablet 1   Vedolizumab (ENTYVIO) 108 MG/0.68ML SOPN Inject 1 Pen into the skin every 14 (fourteen) days. 1.36 mL 12   HYDROcodone bit-homatropine (HYCODAN) 5-1.5 MG/5ML syrup Take 5 mLs by mouth every 6 (six) hours as needed for cough. (Patient not taking: Reported on 06/15/2023) 240 mL 0   No current facility-administered medications for this visit.   Facility-Administered Medications Ordered in Other Visits  Medication Dose Route Frequency Provider Last Rate Last Admin   heparin lock flush 100 unit/mL  500 Units Intravenous Once Jette Lewan, Ivin Booty R, NP       sodium chloride flush (NS) 0.9 % injection 10 mL  10 mL Intravenous PRN Gina Hines, MD   10 mL  at 03/09/21 0906   sodium chloride flush (NS) 0.9 % injection 10 mL  10 mL Intravenous Once Curley Hogen, Daryl Eastern, NP        VITAL SIGNS: BP 109/76   Pulse (!) 104   Temp (!) 96.8 F (36 C) (Tympanic)   Resp 18   Ht 5\' 6"  (1.676 m)   Wt 178 lb 9.6 oz (81 kg)   LMP 09/15/2018   BMI 28.83 kg/m  Filed Weights   06/15/23 1313  Weight: 178 lb 9.6 oz (81 kg)    Estimated body mass index is 28.83 kg/m as calculated from the following:   Height as of this encounter: 5\' 6"  (1.676 m).   Weight as of this encounter: 178 lb 9.6 oz (81 kg).  LABS: CBC:    Component Value Date/Time   WBC 6.5 06/15/2023 1240   WBC 8.6 02/11/2023 0628   HGB 10.4 (L) 06/15/2023 1240   HGB 13.0 05/31/2022 1156   HCT 33.2 (L) 06/15/2023 1240   HCT 40.9 05/31/2022 1156   PLT 304 06/15/2023 1240   PLT 331 05/31/2022 1156   MCV 91.0 06/15/2023 1240   MCV 87 05/31/2022 1156   NEUTROABS 4.6 06/15/2023 1240   NEUTROABS 6.5 03/14/2018 1343    LYMPHSABS 1.1 06/15/2023 1240   LYMPHSABS 4.1 (H) 03/14/2018 1343   MONOABS 0.7 06/15/2023 1240   EOSABS 0.1 06/15/2023 1240   EOSABS 0.4 03/14/2018 1343   BASOSABS 0.0 06/15/2023 1240   BASOSABS 0.0 03/14/2018 1343   Comprehensive Metabolic Panel:    Component Value Date/Time   NA 134 (L) 06/15/2023 1240   NA 141 05/31/2022 1156   K 3.9 06/15/2023 1240   CL 101 06/15/2023 1240   CO2 22 06/15/2023 1240   BUN 16 06/15/2023 1240   BUN 8 05/31/2022 1156   CREATININE 0.69 06/15/2023 1240   GLUCOSE 109 (H) 06/15/2023 1240   CALCIUM 8.8 (L) 06/15/2023 1240   AST 20 06/15/2023 1240   ALT 12 06/15/2023 1240   ALKPHOS 116 06/15/2023 1240   BILITOT 0.4 06/15/2023 1240   PROT 7.5 06/15/2023 1240   PROT 5.9 (L) 05/31/2022 1156   ALBUMIN 3.1 (L) 06/15/2023 1240   ALBUMIN 3.4 (L) 05/31/2022 1156    RADIOGRAPHIC STUDIES: No results found.  PERFORMANCE STATUS (ECOG) : 1 - Symptomatic but completely ambulatory  Review of Systems Unless otherwise noted, a complete review of systems is negative.  Physical Exam General: NAD Cardiac: RRR Pulmonary: clear ant fields Abdominal: Soft, nontender to palp Extremities: no edema, no joint deformities Skin: no rashes Neurological: Weakness but otherwise nonfocal  IMPRESSION/PLAN: Stage IV non-small cell lung cancer -on maintenance Avastin.  Had follow-up earlier this week with [facility] regarding possible clinical trials.  She is optimistic and in agreement with current scope of treatment.  Nausea -suspect secondary to cancer/treatment.  Nonacute abdomen.  Recommended continued antiemetics.  Will increase dose of olanzapine at bedtime.  Will proceed with IV fluids and supportive care today.  Neoplasm related pain -stable on current regimen of fentanyl/hydromorphone  Follow-up telephone visit 1 to 2 months   Patient expressed understanding and was in agreement with this plan. She also understands that She can call clinic at any time with any  questions, concerns, or complaints.   Thank you for allowing me to participate in the care of this very pleasant patient.   Time Total: 15 minutes  Visit consisted of counseling and education dealing with the complex and emotionally intense issues of symptom  management in the setting of serious illness.Greater than 50%  of this time was spent counseling and coordinating care related to the above assessment and plan.  Signed by: Laurette Schimke, PhD, NP-C

## 2023-06-16 ENCOUNTER — Other Ambulatory Visit: Payer: Self-pay

## 2023-06-16 NOTE — Telephone Encounter (Signed)
Patient was calling to check the status of the Entyvio injection. Informed her that it was still pending and I had called Monday and they said the determination date was 06/26/23. Informed her I had a message to check on it on 06/27/2023. She verbalized understanding

## 2023-06-17 ENCOUNTER — Other Ambulatory Visit: Payer: Self-pay | Admitting: Hospice and Palliative Medicine

## 2023-06-20 ENCOUNTER — Other Ambulatory Visit: Payer: Self-pay | Admitting: Hospice and Palliative Medicine

## 2023-06-20 ENCOUNTER — Encounter: Payer: Self-pay | Admitting: Oncology

## 2023-06-20 ENCOUNTER — Other Ambulatory Visit: Payer: Self-pay

## 2023-06-20 MED ORDER — HYDROMORPHONE HCL 2 MG PO TABS: 2.0000 mg | ORAL_TABLET | ORAL | 0 refills | Status: DC | PRN

## 2023-06-23 ENCOUNTER — Encounter: Payer: Self-pay | Admitting: Oncology

## 2023-06-27 ENCOUNTER — Encounter: Payer: Self-pay | Admitting: Oncology

## 2023-06-27 ENCOUNTER — Telehealth: Payer: 59 | Admitting: Hospice and Palliative Medicine

## 2023-06-27 ENCOUNTER — Other Ambulatory Visit: Payer: Self-pay | Admitting: Surgery

## 2023-06-27 DIAGNOSIS — C7951 Secondary malignant neoplasm of bone: Secondary | ICD-10-CM | POA: Diagnosis not present

## 2023-06-27 DIAGNOSIS — M79652 Pain in left thigh: Secondary | ICD-10-CM | POA: Diagnosis not present

## 2023-06-27 DIAGNOSIS — C3490 Malignant neoplasm of unspecified part of unspecified bronchus or lung: Secondary | ICD-10-CM | POA: Diagnosis not present

## 2023-06-28 NOTE — Telephone Encounter (Signed)
[employer] and they denied the Appeal for the Entyvio injection. They said we could do a appeal still on the Entyvio infusion. Submitted the appeal and faxed letter and medical records to [id number] case number Is [id number]. Faxed letter to them and got confirmation that it went through

## 2023-06-28 NOTE — Telephone Encounter (Signed)
Because originally the PA was denied for the Entyvio infusion. When that was denied you said to try to switch to the injection. So we have to do a appeal on the Entyvion infusion

## 2023-06-29 ENCOUNTER — Encounter
Admission: RE | Admit: 2023-06-29 | Discharge: 2023-06-29 | Disposition: A | Discharge status: Home / Self Care | Payer: 59 | Source: Ambulatory Visit | Attending: Surgery | Admitting: Surgery

## 2023-06-29 ENCOUNTER — Other Ambulatory Visit: Payer: Self-pay

## 2023-06-29 DIAGNOSIS — Z01818 Encounter for other preprocedural examination: Secondary | ICD-10-CM | POA: Insufficient documentation

## 2023-06-29 HISTORY — DX: Hyperlipidemia, unspecified: E78.5

## 2023-06-29 HISTORY — DX: Polyp of colon: K63.5

## 2023-06-29 HISTORY — DX: Radiculopathy, lumbar region: M54.16

## 2023-06-29 HISTORY — DX: Elevated white blood cell count, unspecified: D72.829

## 2023-06-29 LAB — URINALYSIS, ROUTINE W REFLEX MICROSCOPIC
Bilirubin Urine: NEGATIVE
Glucose, UA: NEGATIVE mg/dL
Hgb urine dipstick: NEGATIVE
Ketones, ur: NEGATIVE mg/dL
Nitrite: NEGATIVE
Protein, ur: NEGATIVE mg/dL
Specific Gravity, Urine: 1.025 (ref 1.005–1.030)
pH: 5 (ref 5.0–8.0)

## 2023-06-29 LAB — TYPE AND SCREEN
ABO/RH(D): AB NEG
Antibody Screen: NEGATIVE

## 2023-06-29 NOTE — Patient Instructions (Signed)
Your procedure is scheduled on: Thursday, October 17 Report to the Registration Desk on the 1st floor of the [facility] To find out your arrival time, please call [phone] between 1PM - 3PM on: Wednesday, October 16 If your arrival time is 6:00 am, do not arrive before that time as the [facility] entrance doors do not open until 6:00 am.  REMEMBER: Instructions that are not followed completely may result in serious medical risk, up to and including death; or upon the discretion of your surgeon and anesthesiologist your surgery may need to be rescheduled.  Do not eat food after midnight the night before surgery.  No gum chewing or hard candies.  You may however, drink CLEAR liquids up to 2 hours before you are scheduled to arrive for your surgery. Do not drink anything within 2 hours of your scheduled arrival time.  Clear liquids include: - water  - apple juice without pulp - gatorade (not RED colors) - black coffee or tea (Do NOT add milk or creamers to the coffee or tea) Do NOT drink anything that is not on this list.  In addition, your doctor has ordered for you to drink the provided:  Ensure Pre-Surgery Clear Carbohydrate Drink  Drinking this carbohydrate drink up to two hours before surgery helps to reduce insulin resistance and improve patient outcomes. Please complete drinking 2 hours before scheduled arrival time.  One week prior to surgery: Stop Anti-inflammatories (NSAIDS) such as Advil, Aleve, Ibuprofen, Motrin, Naproxen, Naprosyn and Aspirin based products such as Excedrin, Goody's Powder, BC Powder. Stop ANY OVER THE COUNTER supplements until after surgery.  You may however, continue to take Tylenol if needed for pain up until the day of surgery.  Continue taking all of your other prescription medications up until the day of surgery.  ON THE DAY OF SURGERY ONLY TAKE THESE MEDICATIONS WITH SIPS OF WATER:  Alprazolam (Xanax) if needed for  anxiety Hydrocortisone Hydromorphone if needed for pain  You may leave your fentanyl pain patch on.  Take lidocaine patch off before surgery.  No Alcohol for 24 hours before or after surgery.  No Smoking including e-cigarettes for 24 hours before surgery.  No chewable tobacco products for at least 6 hours before surgery.  No nicotine patches on the day of surgery.  Do not use any "recreational" drugs for at least a week (preferably 2 weeks) before your surgery.  Please be advised that the combination of cocaine and anesthesia may have negative outcomes, up to and including death. If you test positive for cocaine, your surgery will be cancelled.  On the morning of surgery brush your teeth with toothpaste and water, you may rinse your mouth with mouthwash if you wish. Do not swallow any toothpaste or mouthwash.  Use CHG Soap as directed on instruction sheet.  Do not wear jewelry, make-up, hairpins, clips or nail polish.  For welded (permanent) jewelry: bracelets, anklets, waist bands, etc.  Please have this removed prior to surgery.  If it is not removed, there is a chance that hospital personnel will need to cut it off on the day of surgery.  Do not wear lotions, powders, or perfumes.   Do not shave body hair from the neck down 48 hours before surgery.  Contact lenses, hearing aids and dentures may not be worn into surgery.  Do not bring valuables to the hospital. [facility] is not responsible for any missing/lost belongings or valuables.   Notify your doctor if there is any  change in your medical condition (cold, fever, infection).  Wear comfortable clothing (specific to your surgery type) to the hospital.  After surgery, you can help prevent lung complications by doing breathing exercises.  Take deep breaths and cough every 1-2 hours. Your doctor may order a device called an Incentive Spirometer to help you take deep breaths.  If you are being admitted to the hospital  overnight, leave your suitcase in the car. After surgery it may be brought to your room.  In case of increased patient census, it may be necessary for you, the patient, to continue your postoperative care in the Same Day Surgery department.  If you are being discharged the day of surgery, you will not be allowed to drive home. You will need a responsible individual to drive you home and stay with you for 24 hours after surgery.   If you are taking public transportation, you will need to have a responsible individual with you.  Please call the Pre-admissions Testing Dept. at [phone] if you have any questions about these instructions.  Surgery Visitation Policy:  Patients having surgery or a procedure may have two visitors.  Children under the age of 74 must have an adult with them who is not the patient.  Inpatient Visitation:    Visiting hours are 7 a.m. to 8 p.m. Up to four visitors are allowed at one time in a patient room. The visitors may rotate out with other people during the day.  One visitor age 76 or older may stay with the patient overnight and must be in the room by 8 p.m.     Preparing for Surgery with CHLORHEXIDINE GLUCONATE (CHG) Soap  Chlorhexidine Gluconate (CHG) Soap  o An antiseptic cleaner that kills germs and bonds with the skin to continue killing germs even after washing  o Used for showering the night before surgery and morning of surgery  Before surgery, you can play an important role by reducing the number of germs on your skin.  CHG (Chlorhexidine gluconate) soap is an antiseptic cleanser which kills germs and bonds with the skin to continue killing germs even after washing.  Please do not use if you have an allergy to CHG or antibacterial soaps. If your skin becomes reddened/irritated stop using the CHG.  1. Shower the NIGHT BEFORE SURGERY and the MORNING OF SURGERY with CHG soap.  2. If you choose to wash your hair, wash your hair first as  usual with your normal shampoo.  3. After shampooing, rinse your hair and body thoroughly to remove the shampoo.  4. Use CHG as you would any other liquid soap. You can apply CHG directly to the skin and wash gently with a scrungie or a clean washcloth.  5. Apply the CHG soap to your body only from the neck down. Do not use on open wounds or open sores. Avoid contact with your eyes, ears, mouth, and genitals (private parts). Wash face and genitals (private parts) with your normal soap.  6. Wash thoroughly, paying special attention to the area where your surgery will be performed.  7. Thoroughly rinse your body with warm water.  8. Do not shower/wash with your normal soap after using and rinsing off the CHG soap.  9. Pat yourself dry with a clean towel.  10. Wear clean pajamas to bed the night before surgery.  12. Place clean sheets on your bed the night of your first shower and do not sleep with pets.  13. Shower again  with the CHG soap on the day of surgery prior to arriving at the hospital.  14. Do not apply any deodorants/lotions/powders.  15. Please wear clean clothes to the hospital.

## 2023-06-30 ENCOUNTER — Other Ambulatory Visit: Payer: Self-pay

## 2023-06-30 ENCOUNTER — Inpatient Hospital Stay
Admission: RE | Admit: 2023-06-30 | Discharge: 2023-07-03 | DRG: 481 | Disposition: A | Discharge status: Home Health | Payer: 59 | Attending: Surgery | Admitting: Surgery

## 2023-06-30 ENCOUNTER — Ambulatory Visit: Payer: 59 | Admitting: Urgent Care

## 2023-06-30 ENCOUNTER — Encounter: Payer: Self-pay | Admitting: Surgery

## 2023-06-30 ENCOUNTER — Ambulatory Visit: Payer: 59 | Admitting: Certified Registered Nurse Anesthetist

## 2023-06-30 ENCOUNTER — Ambulatory Visit: Payer: 59

## 2023-06-30 ENCOUNTER — Encounter
Admission: RE | Disposition: A | Discharge status: Home Health | Payer: Self-pay | Source: Home / Self Care | Attending: Surgery

## 2023-06-30 DIAGNOSIS — M84452A Pathological fracture, left femur, initial encounter for fracture: Secondary | ICD-10-CM | POA: Diagnosis not present

## 2023-06-30 DIAGNOSIS — Z87311 Personal history of (healed) other pathological fracture: Secondary | ICD-10-CM

## 2023-06-30 DIAGNOSIS — Z79891 Long term (current) use of opiate analgesic: Secondary | ICD-10-CM

## 2023-06-30 DIAGNOSIS — Z8249 Family history of ischemic heart disease and other diseases of the circulatory system: Secondary | ICD-10-CM

## 2023-06-30 DIAGNOSIS — C7951 Secondary malignant neoplasm of bone: Secondary | ICD-10-CM | POA: Diagnosis present

## 2023-06-30 DIAGNOSIS — I10 Essential (primary) hypertension: Secondary | ICD-10-CM | POA: Diagnosis not present

## 2023-06-30 DIAGNOSIS — E785 Hyperlipidemia, unspecified: Secondary | ICD-10-CM | POA: Diagnosis present

## 2023-06-30 DIAGNOSIS — Z888 Allergy status to other drugs, medicaments and biological substances status: Secondary | ICD-10-CM

## 2023-06-30 DIAGNOSIS — F411 Generalized anxiety disorder: Secondary | ICD-10-CM | POA: Diagnosis present

## 2023-06-30 DIAGNOSIS — M84551A Pathological fracture in neoplastic disease, right femur, initial encounter for fracture: Secondary | ICD-10-CM | POA: Diagnosis not present

## 2023-06-30 DIAGNOSIS — Z800 Family history of malignant neoplasm of digestive organs: Secondary | ICD-10-CM

## 2023-06-30 DIAGNOSIS — F32A Depression, unspecified: Secondary | ICD-10-CM | POA: Diagnosis present

## 2023-06-30 DIAGNOSIS — M84552A Pathological fracture in neoplastic disease, left femur, initial encounter for fracture: Principal | ICD-10-CM | POA: Diagnosis present

## 2023-06-30 DIAGNOSIS — K219 Gastro-esophageal reflux disease without esophagitis: Secondary | ICD-10-CM | POA: Diagnosis present

## 2023-06-30 DIAGNOSIS — Z860100 Personal history of colon polyps, unspecified: Secondary | ICD-10-CM

## 2023-06-30 DIAGNOSIS — C3490 Malignant neoplasm of unspecified part of unspecified bronchus or lung: Secondary | ICD-10-CM | POA: Diagnosis present

## 2023-06-30 DIAGNOSIS — Z79899 Other long term (current) drug therapy: Secondary | ICD-10-CM

## 2023-06-30 DIAGNOSIS — Z87891 Personal history of nicotine dependence: Secondary | ICD-10-CM

## 2023-06-30 DIAGNOSIS — E2749 Other adrenocortical insufficiency: Secondary | ICD-10-CM | POA: Diagnosis present

## 2023-06-30 DIAGNOSIS — Z975 Presence of (intrauterine) contraceptive device: Secondary | ICD-10-CM

## 2023-06-30 DIAGNOSIS — Z7901 Long term (current) use of anticoagulants: Secondary | ICD-10-CM

## 2023-06-30 DIAGNOSIS — Z808 Family history of malignant neoplasm of other organs or systems: Secondary | ICD-10-CM

## 2023-06-30 DIAGNOSIS — Z833 Family history of diabetes mellitus: Secondary | ICD-10-CM

## 2023-06-30 DIAGNOSIS — M84453A Pathological fracture, unspecified femur, initial encounter for fracture: Principal | ICD-10-CM | POA: Diagnosis present

## 2023-06-30 DIAGNOSIS — Z8262 Family history of osteoporosis: Secondary | ICD-10-CM

## 2023-06-30 DIAGNOSIS — Z7952 Long term (current) use of systemic steroids: Secondary | ICD-10-CM

## 2023-06-30 DIAGNOSIS — Z9689 Presence of other specified functional implants: Secondary | ICD-10-CM | POA: Diagnosis not present

## 2023-06-30 DIAGNOSIS — R000 Tachycardia, unspecified: Secondary | ICD-10-CM | POA: Diagnosis present

## 2023-06-30 HISTORY — PX: FEMUR IM NAIL: SHX1597

## 2023-06-30 SURGERY — INSERTION, INTRAMEDULLARY ROD, FEMUR
Anesthesia: Spinal | Site: Hip | Laterality: Left

## 2023-06-30 MED ORDER — PHENYLEPHRINE HCL-NACL 20-0.9 MG/250ML-% IV SOLN
INTRAVENOUS | Status: AC
  Filled 2023-06-30: qty 250

## 2023-06-30 MED ORDER — PROPOFOL 1000 MG/100ML IV EMUL
INTRAVENOUS | Status: AC
  Filled 2023-06-30: qty 100

## 2023-06-30 MED ORDER — FENTANYL 25 MCG/HR TD PT72
1.0000 | MEDICATED_PATCH | TRANSDERMAL | Status: DC
  Administered 2023-07-01: 1 via TRANSDERMAL
  Filled 2023-06-30 (×2): qty 1

## 2023-06-30 MED ORDER — BUPIVACAINE LIPOSOME 1.3 % IJ SUSP
INTRAMUSCULAR | Status: DC | PRN
Start: 2023-06-30 — End: 2023-06-30
  Administered 2023-06-30: 10 mL

## 2023-06-30 MED ORDER — BUPIVACAINE HCL (PF) 0.5 % IJ SOLN
INTRAMUSCULAR | Status: AC
  Filled 2023-06-30: qty 10

## 2023-06-30 MED ORDER — HYDROCORTISONE 10 MG PO TABS
20.0000 mg | ORAL_TABLET | Freq: Every morning | ORAL | Status: DC
  Administered 2023-07-01: 20 mg via ORAL
  Filled 2023-06-30 (×2): qty 2

## 2023-06-30 MED ORDER — ACETAMINOPHEN 10 MG/ML IV SOLN
INTRAVENOUS | Status: DC | PRN
  Administered 2023-06-30: 1000 mg via INTRAVENOUS

## 2023-06-30 MED ORDER — BUPIVACAINE HCL (PF) 0.5 % IJ SOLN
INTRAMUSCULAR | Status: DC | PRN
  Administered 2023-06-30: 3 mL via INTRATHECAL

## 2023-06-30 MED ORDER — HYDROCORTISONE SOD SUC (PF) 100 MG IJ SOLR
INTRAMUSCULAR | Status: DC | PRN
Start: 2023-06-30 — End: 2023-06-30
  Administered 2023-06-30: 100 mg via INTRAVENOUS

## 2023-06-30 MED ORDER — OXYCODONE HCL 5 MG PO TABS
ORAL_TABLET | ORAL | Status: AC
  Filled 2023-06-30: qty 1

## 2023-06-30 MED ORDER — 0.9 % SODIUM CHLORIDE (POUR BTL) OPTIME
TOPICAL | Status: DC | PRN
  Administered 2023-06-30: 500 mL

## 2023-06-30 MED ORDER — KETOROLAC TROMETHAMINE 15 MG/ML IJ SOLN
15.0000 mg | Freq: Four times a day (QID) | INTRAMUSCULAR | Status: AC
  Administered 2023-06-30 – 2023-07-01 (×3): 15 mg via INTRAVENOUS
  Filled 2023-06-30: qty 1

## 2023-06-30 MED ORDER — BISACODYL 10 MG RE SUPP: 10.0000 mg | Freq: Every day | RECTAL | Status: DC | PRN

## 2023-06-30 MED ORDER — LOSARTAN POTASSIUM 50 MG PO TABS: 50.0000 mg | ORAL_TABLET | Freq: Every day | ORAL | Status: DC

## 2023-06-30 MED ORDER — EPINEPHRINE PF 1 MG/ML IJ SOLN
INTRAMUSCULAR | Status: AC
  Filled 2023-06-30: qty 1

## 2023-06-30 MED ORDER — DEXTROSE-SODIUM CHLORIDE 5-0.9 % IV SOLN: INTRAVENOUS | Status: AC

## 2023-06-30 MED ORDER — PROPOFOL 500 MG/50ML IV EMUL
INTRAVENOUS | Status: DC | PRN
  Administered 2023-06-30: 75 ug/kg/min via INTRAVENOUS

## 2023-06-30 MED ORDER — APIXABAN 2.5 MG PO TABS
2.5000 mg | ORAL_TABLET | Freq: Two times a day (BID) | ORAL | Status: DC
  Administered 2023-07-01 – 2023-07-03 (×5): 2.5 mg via ORAL
  Filled 2023-06-30 (×4): qty 1

## 2023-06-30 MED ORDER — CEFAZOLIN SODIUM-DEXTROSE 2-4 GM/100ML-% IV SOLN
INTRAVENOUS | Status: AC
  Filled 2023-06-30: qty 100

## 2023-06-30 MED ORDER — KETOROLAC TROMETHAMINE 15 MG/ML IJ SOLN
INTRAMUSCULAR | Status: AC
  Filled 2023-06-30: qty 1

## 2023-06-30 MED ORDER — BUPIVACAINE-EPINEPHRINE (PF) 0.5% -1:200000 IJ SOLN
INTRAMUSCULAR | Status: DC | PRN
  Administered 2023-06-30: 20 mL

## 2023-06-30 MED ORDER — PHENYLEPHRINE HCL-NACL 20-0.9 MG/250ML-% IV SOLN
INTRAVENOUS | Status: DC | PRN
Start: 2023-06-30 — End: 2023-06-30
  Administered 2023-06-30: 35 ug/min via INTRAVENOUS

## 2023-06-30 MED ORDER — ONDANSETRON HCL 4 MG/2ML IJ SOLN: 4.0000 mg | Freq: Four times a day (QID) | INTRAMUSCULAR | Status: DC | PRN

## 2023-06-30 MED ORDER — ONDANSETRON HCL 4 MG/2ML IJ SOLN
INTRAMUSCULAR | Status: DC | PRN
  Administered 2023-06-30: 4 mg via INTRAVENOUS

## 2023-06-30 MED ORDER — OLANZAPINE 5 MG PO TABS
5.0000 mg | ORAL_TABLET | Freq: Every day | ORAL | Status: DC
  Administered 2023-06-30 – 2023-07-02 (×3): 5 mg via ORAL
  Filled 2023-06-30 (×4): qty 1

## 2023-06-30 MED ORDER — MIDAZOLAM HCL 5 MG/5ML IJ SOLN
INTRAMUSCULAR | Status: DC | PRN
  Administered 2023-06-30: 2 mg via INTRAVENOUS

## 2023-06-30 MED ORDER — CEFAZOLIN SODIUM-DEXTROSE 2-4 GM/100ML-% IV SOLN
2.0000 g | INTRAVENOUS | Status: AC
  Administered 2023-06-30: 2 g via INTRAVENOUS

## 2023-06-30 MED ORDER — CITALOPRAM HYDROBROMIDE 10 MG PO TABS
10.0000 mg | ORAL_TABLET | Freq: Every day | ORAL | Status: DC
  Administered 2023-06-30 – 2023-07-02 (×3): 10 mg via ORAL
  Filled 2023-06-30 (×4): qty 1

## 2023-06-30 MED ORDER — BUPIVACAINE HCL (PF) 0.5 % IJ SOLN
INTRAMUSCULAR | Status: AC
  Filled 2023-06-30: qty 30

## 2023-06-30 MED ORDER — EPHEDRINE 5 MG/ML INJ
INTRAVENOUS | Status: AC
  Filled 2023-06-30: qty 5

## 2023-06-30 MED ORDER — METOCLOPRAMIDE HCL 10 MG PO TABS: 5.0000 mg | ORAL_TABLET | Freq: Three times a day (TID) | ORAL | Status: DC | PRN

## 2023-06-30 MED ORDER — MAGNESIUM HYDROXIDE 400 MG/5ML PO SUSP: 30.0000 mL | Freq: Every day | ORAL | Status: DC | PRN

## 2023-06-30 MED ORDER — CHLORHEXIDINE GLUCONATE 0.12 % MT SOLN
OROMUCOSAL | Status: AC
  Filled 2023-06-30: qty 15

## 2023-06-30 MED ORDER — DOCUSATE SODIUM 100 MG PO CAPS
ORAL_CAPSULE | ORAL | Status: AC
  Filled 2023-06-30: qty 1

## 2023-06-30 MED ORDER — LACTATED RINGERS IV SOLN: INTRAVENOUS | Status: DC

## 2023-06-30 MED ORDER — KETOROLAC TROMETHAMINE 30 MG/ML IJ SOLN
INTRAMUSCULAR | Status: AC
  Filled 2023-06-30: qty 1

## 2023-06-30 MED ORDER — CHLORHEXIDINE GLUCONATE 0.12 % MT SOLN
15.0000 mL | Freq: Once | OROMUCOSAL | Status: AC
  Administered 2023-06-30: 15 mL via OROMUCOSAL

## 2023-06-30 MED ORDER — METOCLOPRAMIDE HCL 5 MG/ML IJ SOLN: 5.0000 mg | Freq: Three times a day (TID) | INTRAMUSCULAR | Status: DC | PRN

## 2023-06-30 MED ORDER — ONDANSETRON HCL 4 MG PO TABS: 4.0000 mg | ORAL_TABLET | Freq: Four times a day (QID) | ORAL | Status: DC | PRN

## 2023-06-30 MED ORDER — CEFAZOLIN SODIUM-DEXTROSE 2-4 GM/100ML-% IV SOLN
2.0000 g | Freq: Four times a day (QID) | INTRAVENOUS | Status: AC
  Administered 2023-06-30 – 2023-07-01 (×3): 2 g via INTRAVENOUS

## 2023-06-30 MED ORDER — OXYCODONE HCL 5 MG PO TABS: 5.0000 mg | ORAL_TABLET | Freq: Once | ORAL | Status: DC | PRN

## 2023-06-30 MED ORDER — ALPRAZOLAM 0.5 MG PO TABS
0.5000 mg | ORAL_TABLET | Freq: Two times a day (BID) | ORAL | Status: DC | PRN
  Administered 2023-07-01 (×2): 0.5 mg via ORAL
  Filled 2023-06-30: qty 1

## 2023-06-30 MED ORDER — HYDROCORTISONE 10 MG PO TABS
10.0000 mg | ORAL_TABLET | Freq: Every day | ORAL | Status: DC
  Administered 2023-06-30: 10 mg via ORAL
  Filled 2023-06-30 (×2): qty 1

## 2023-06-30 MED ORDER — ACETAMINOPHEN 10 MG/ML IV SOLN
INTRAVENOUS | Status: AC
  Filled 2023-06-30: qty 100

## 2023-06-30 MED ORDER — ORAL CARE MOUTH RINSE: 15.0000 mL | Freq: Once | OROMUCOSAL | Status: AC

## 2023-06-30 MED ORDER — FLEET ENEMA RE ENEM: 1.0000 | ENEMA | Freq: Once | RECTAL | Status: DC | PRN

## 2023-06-30 MED ORDER — OXYCODONE HCL 5 MG/5ML PO SOLN: 5.0000 mg | Freq: Once | ORAL | Status: DC | PRN

## 2023-06-30 MED ORDER — ACETAMINOPHEN 325 MG PO TABS
325.0000 mg | ORAL_TABLET | Freq: Four times a day (QID) | ORAL | Status: DC | PRN
  Administered 2023-07-02: 325 mg via ORAL
  Administered 2023-07-03: 650 mg via ORAL
  Filled 2023-06-30 (×2): qty 2

## 2023-06-30 MED ORDER — BUPIVACAINE LIPOSOME 1.3 % IJ SUSP
INTRAMUSCULAR | Status: AC
  Filled 2023-06-30: qty 10

## 2023-06-30 MED ORDER — ACETAMINOPHEN 500 MG PO TABS
ORAL_TABLET | ORAL | Status: AC
  Filled 2023-06-30: qty 2

## 2023-06-30 MED ORDER — HYDROMORPHONE HCL 2 MG PO TABS
2.0000 mg | ORAL_TABLET | ORAL | Status: DC | PRN
  Administered 2023-06-30 (×2): 2 mg via ORAL
  Administered 2023-07-01 – 2023-07-02 (×7): 4 mg via ORAL
  Administered 2023-07-02 – 2023-07-03 (×4): 2 mg via ORAL
  Filled 2023-06-30 (×3): qty 2
  Filled 2023-06-30: qty 1
  Filled 2023-06-30: qty 2
  Filled 2023-06-30: qty 1
  Filled 2023-06-30: qty 2
  Filled 2023-06-30 (×4): qty 1

## 2023-06-30 MED ORDER — HYDROMORPHONE HCL 2 MG PO TABS
ORAL_TABLET | ORAL | Status: AC
  Filled 2023-06-30: qty 1

## 2023-06-30 MED ORDER — KETOROLAC TROMETHAMINE 15 MG/ML IJ SOLN: 15.0000 mg | Freq: Four times a day (QID) | INTRAMUSCULAR | Status: DC

## 2023-06-30 MED ORDER — LIDOCAINE 5 % EX PTCH
1.0000 | MEDICATED_PATCH | CUTANEOUS | Status: DC
  Filled 2023-06-30 (×3): qty 1

## 2023-06-30 MED ORDER — DOCUSATE SODIUM 100 MG PO CAPS
100.0000 mg | ORAL_CAPSULE | Freq: Two times a day (BID) | ORAL | Status: DC
  Administered 2023-06-30 – 2023-07-03 (×5): 100 mg via ORAL
  Filled 2023-06-30 (×4): qty 1

## 2023-06-30 MED ORDER — FENTANYL CITRATE (PF) 100 MCG/2ML IJ SOLN
INTRAMUSCULAR | Status: AC
  Filled 2023-06-30: qty 2

## 2023-06-30 MED ORDER — HYDROMORPHONE HCL 1 MG/ML IJ SOLN: 0.2500 mg | INTRAMUSCULAR | Status: DC | PRN

## 2023-06-30 MED ORDER — DIPHENHYDRAMINE HCL 12.5 MG/5ML PO ELIX: 12.5000 mg | ORAL_SOLUTION | ORAL | Status: DC | PRN

## 2023-06-30 MED ORDER — PHENYLEPHRINE 80 MCG/ML (10ML) SYRINGE FOR IV PUSH (FOR BLOOD PRESSURE SUPPORT)
PREFILLED_SYRINGE | INTRAVENOUS | Status: DC | PRN
  Administered 2023-06-30: 160 ug via INTRAVENOUS
  Administered 2023-06-30: 80 ug via INTRAVENOUS
  Administered 2023-06-30: 160 ug via INTRAVENOUS
  Administered 2023-06-30 (×2): 80 ug via INTRAVENOUS
  Administered 2023-06-30 (×2): 160 ug via INTRAVENOUS
  Administered 2023-06-30: 80 ug via INTRAVENOUS

## 2023-06-30 MED ORDER — ACETAMINOPHEN 500 MG PO TABS
1000.0000 mg | ORAL_TABLET | Freq: Four times a day (QID) | ORAL | Status: AC
  Administered 2023-06-30 – 2023-07-01 (×4): 1000 mg via ORAL

## 2023-06-30 MED ORDER — MIDAZOLAM HCL 2 MG/2ML IJ SOLN
INTRAMUSCULAR | Status: AC
  Filled 2023-06-30: qty 2

## 2023-06-30 MED ORDER — HYDROMORPHONE HCL 1 MG/ML IJ SOLN
INTRAMUSCULAR | Status: AC
  Filled 2023-06-30: qty 1

## 2023-06-30 MED ORDER — PHENYLEPHRINE 80 MCG/ML (10ML) SYRINGE FOR IV PUSH (FOR BLOOD PRESSURE SUPPORT)
PREFILLED_SYRINGE | INTRAVENOUS | Status: AC
  Filled 2023-06-30: qty 10

## 2023-06-30 MED ORDER — FENTANYL CITRATE (PF) 100 MCG/2ML IJ SOLN
INTRAMUSCULAR | Status: DC | PRN
  Administered 2023-06-30 (×2): 50 ug via INTRAVENOUS

## 2023-06-30 MED ORDER — KETOROLAC TROMETHAMINE 30 MG/ML IJ SOLN
30.0000 mg | Freq: Once | INTRAMUSCULAR | Status: AC
  Administered 2023-06-30: 30 mg via INTRAVENOUS

## 2023-06-30 SURGICAL SUPPLY — 51 items
APL PRP STRL LF DISP 70% ISPRP (MISCELLANEOUS) ×2
BIT DRILL CROWE POINT TWST 4.3 (DRILL) IMPLANT
BNDG CMPR 5X4 CHSV STRCH STRL (GAUZE/BANDAGES/DRESSINGS) ×1
BNDG CMPR 5X6 CHSV STRCH STRL (GAUZE/BANDAGES/DRESSINGS) ×1
BNDG COHESIVE 4X5 TAN STRL LF (GAUZE/BANDAGES/DRESSINGS) ×1 IMPLANT
BNDG COHESIVE 6X5 TAN ST LF (GAUZE/BANDAGES/DRESSINGS) ×1 IMPLANT
CHLORAPREP W/TINT 26 (MISCELLANEOUS) ×2 IMPLANT
CORTICAL BONE SCR 5.0MM X 46MM (Screw) ×1 IMPLANT
CORTICAL BONE SCR 5.0MM X 48MM (Screw) ×1 IMPLANT
DRAPE C-ARMOR (DRAPES) ×1 IMPLANT
DRAPE SHEET LG 3/4 BI-LAMINATE (DRAPES) ×1 IMPLANT
DRILL CROWE POINT TWIST 4.3 (DRILL) ×1
DRSG MEPILEX SACRM 8.7X9.8 (GAUZE/BANDAGES/DRESSINGS) ×1 IMPLANT
DRSG OPSITE POSTOP 3X4 (GAUZE/BANDAGES/DRESSINGS) IMPLANT
DRSG OPSITE POSTOP 4X6 (GAUZE/BANDAGES/DRESSINGS) IMPLANT
ELECT CAUTERY BLADE 6.4 (BLADE) ×1 IMPLANT
ELECT REM PT RETURN 9FT ADLT (ELECTROSURGICAL) ×1
ELECTRODE REM PT RTRN 9FT ADLT (ELECTROSURGICAL) ×1 IMPLANT
GAUZE SPONGE 4X4 12PLY STRL (GAUZE/BANDAGES/DRESSINGS) ×1 IMPLANT
GLOVE BIO SURGEON STRL SZ8 (GLOVE) ×2 IMPLANT
GLOVE INDICATOR 8.0 STRL GRN (GLOVE) ×1 IMPLANT
GOWN STRL REUS W/ TWL LRG LVL3 (GOWN DISPOSABLE) ×1 IMPLANT
GOWN STRL REUS W/ TWL XL LVL3 (GOWN DISPOSABLE) ×1 IMPLANT
GOWN STRL REUS W/TWL LRG LVL3 (GOWN DISPOSABLE) ×1
GOWN STRL REUS W/TWL XL LVL3 (GOWN DISPOSABLE) ×1
GUIDEPIN VERSANAIL DSP 3.2X444 (ORTHOPEDIC DISPOSABLE SUPPLIES) IMPLANT
GUIDEWIRE BALL NOSE 100CM (WIRE) IMPLANT
HANDLE YANKAUER SUCT OPEN TIP (MISCELLANEOUS) ×1 IMPLANT
HFN LH 130 DEG 9MM X 360MM (Nail) IMPLANT
HIP FRA NAIL LAG SCREW 10.5X90 (Orthopedic Implant) ×1 IMPLANT
MANIFOLD NEPTUNE II (INSTRUMENTS) ×1 IMPLANT
MAT ABSORB FLUID 56X50 GRAY (MISCELLANEOUS) ×1 IMPLANT
NDL FILTER BLUNT 18X1 1/2 (NEEDLE) ×1 IMPLANT
NDL HYPO 22X1.5 SAFETY MO (MISCELLANEOUS) ×1 IMPLANT
NEEDLE FILTER BLUNT 18X1 1/2 (NEEDLE) ×1 IMPLANT
NEEDLE HYPO 22X1.5 SAFETY MO (MISCELLANEOUS) ×1 IMPLANT
NS IRRIG 500ML POUR BTL (IV SOLUTION) ×1 IMPLANT
PACK HIP COMPR (MISCELLANEOUS) ×1 IMPLANT
SCREW CORTICL BON 5.0MM X 46MM (Screw) IMPLANT
SCREW CORTICL BON 5.0MM X 48MM (Screw) IMPLANT
SCREW LAG HIP FRA NAIL 10.5X90 (Orthopedic Implant) IMPLANT
STAPLER SKIN PROX 35W (STAPLE) ×1 IMPLANT
STRAP SAFETY 5IN WIDE (MISCELLANEOUS) ×1 IMPLANT
SUT VIC AB 0 CT1 36 (SUTURE) ×1 IMPLANT
SUT VIC AB 1 CT1 36 (SUTURE) ×1 IMPLANT
SUT VIC AB 2-0 CT1 (SUTURE) ×2 IMPLANT
SYR 10ML LL (SYRINGE) ×1 IMPLANT
SYR 30ML LL (SYRINGE) ×1 IMPLANT
TAPE MICROFOAM 4IN (TAPE) ×1 IMPLANT
TRAP FLUID SMOKE EVACUATOR (MISCELLANEOUS) ×1 IMPLANT
WATER STERILE IRR 500ML POUR (IV SOLUTION) ×1 IMPLANT

## 2023-06-30 NOTE — Transfer of Care (Signed)
Immediate Anesthesia Transfer of Care Note  Patient: Gina Sanchez  Procedure(s) Performed: Left prophylactic intramedullary nailing of impending left subtrochanteric femur fracture (Left: Hip)  Patient Location: PACU  Anesthesia Type:SPINAL  Level of Consciousness: awake, alert , and oriented  Airway & Oxygen Therapy: Patient Spontanous Breathing and Patient connected to face mask oxygen  Post-op Assessment: Report given to RN and Post -op Vital signs reviewed and stable  Post vital signs: Reviewed and stable  Last Vitals:  Vitals Value Taken Time  BP 103/72 06/30/23 1543  Temp    Pulse 102 06/30/23 1544  Resp 18 06/30/23 1544  SpO2 99 % 06/30/23 1544  Vitals shown include unfiled device data.  Last Pain:  Vitals:   06/30/23 1132  TempSrc: Temporal  PainSc: 5          Complications: No notable events documented.

## 2023-06-30 NOTE — Op Note (Signed)
06/30/2023  3:42 PM  Patient:   Gina Sanchez  Pre-Op Diagnosis:   Pain of left thigh secondary to impending pathologic fracture of left femur.  Post-Op Diagnosis:   Same  Procedure:   Prophylactic intramedullary nailing of left femur with Biomet Affixis TFN nail.  Surgeon:   Maryagnes Amos, MD  Assistant:   None  Anesthesia:   Spinal  Findings:   As above  Complications:   None  EBL:   100 cc  Fluids:   500 cc crystalloid  UOP:   None  TT:   None  Drains:   None  Closure:   Staples  Implants:   Biomet Affixis 9 x 360 mm TFN with a 90 mm lag screw and 46 mm and 48 mm distal interlocking screws  Brief Clinical Note:   The patient is a 57 year old female with a history of metastatic lung cancer who developed increased left thigh pain over the past month. X-rays have demonstrated the presence of multiple blastic lesions involving the intertrochanteric, subtrochanteric, and femoral shaft regions of the left femur. The patient presents at this time for prophylactic intramedullary nailing of the left femur.  Procedure:   The patient was brought into the operating room. After adequate spinal anesthesia was obtained, the patient was lain in the supine position on the fracture table. The uninvolved leg was placed in a flexed and abducted position while the left lower extremity was placed in longitudinal traction. The hip and femur were optimally aligned using longitudinal traction and internal rotation. The adequacy of positioning was verified fluoroscopically in AP and lateral projections and found to be excellent. The lateral aspects of the left hip and thigh were prepped with ChloraPrep solution before being draped sterilely. Preoperative antibiotics were administered. A timeout was performed to verify the appropriate surgical site.   The greater trochanter was identified fluoroscopically and an approximately 5-6 cm incision made about 2-3 fingerbreadths above the tip of the  greater trochanter. The incision was carried down through the subcutaneous tissues to expose the gluteal fascia. This was split the length of the incision, providing access to the tip of the trochanter. Under fluoroscopic guidance, a guidewire was drilled through the tip of the trochanter into the proximal metaphysis to the level of the lesser trochanter. After verifying its position fluoroscopically in AP and lateral projections, it was overreamed with the initial reamer to the depth of the lesser trochanter.   A guidewire was passed down through the femoral canal to the supracondylar region. Before being able to pass the guidewire all the way down, the subtrochanteric and more distal femoral shaft lesions needed to be reamed carefully to permit the beaded guidewire to pass more distally. Multiple reamings were collected and sent to pathology. The adequacy of guidewire position was verified fluoroscopically in AP and lateral projections before the length of the guidewire within the canal was measured and found to be 375 mm. Therefore, a 360 mm length nail was selected. The guidewire was overreamed sequentially using the flexible reamers, beginning with an 8 mm reamer and progressing to an 11 mm reamer. This provided good cortical chatter. The 9 x 360 mm Biomet Affixis TFN rod was selected and advanced to the appropriate depth, as verified fluoroscopically.   The guide system for the lag screw was positioned and advanced through an approximately 2 cm stab incision over the lateral aspect of the proximal femur. The guidewire was drilled up through the trochanteric femoral nail and into the  femoral neck to rest within 5 mm of subchondral bone. After verifying its position in the femoral neck and head in both AP and lateral projections, the guidewire was measured and found to be optimally replicated by a 90 mm lag screw. The guidewire was overreamed to the appropriate depth before the lag screw was inserted and  advanced to the appropriate depth as verified fluoroscopically in AP and lateral projections. The locking screw was advanced, then backed off a quarter turn to set the lag screw. Again the adequacy of hardware position and fracture reduction was verified fluoroscopically in AP and lateral projections and found to be excellent.  Attention was directed distally. Using the "perfect circle" technique, the leg and fluoroscopy machine were positioned appropriately. An approximately 1.5 cm stab incision was made over the skin at the appropriate point before the drill bit was advanced through the cortex and across the static hole of the nail. The appropriate length of the screw was determined before the 46 mm distal interlocking screw was positioned, then advanced and tightened securely. Given that the pathology included subtrochanteric and more distal femoral shaft lesions, it was felt best to place two interlocking screws distally. Therefore, a second interlocking screw was placed through the proximal end of the dynamic hole using the same technique as described above. A 48 mm distal interlocking screw was selected and advanced and tightened securely. Again the adequacy of screw position was verified fluoroscopically in AP and lateral projections and found to be excellent.  The wounds were irrigated thoroughly with sterile saline solution before the abductor fascia was reapproximated using #0 Vicryl interrupted sutures. The subcutaneous tissues were closed using 2-0 Vicryl interrupted sutures. The skin was closed using staples. A total of 20 cc of 0.5% Sensorcaine with epinephrine and 10 cc of Exparel was injected in and around all incisions. Sterile occlusive dressings were applied to all wounds before the patient was transferred back to her hospital bed. The patient was then returned to the recovery room in satisfactory condition after tolerating the procedure well.

## 2023-06-30 NOTE — Anesthesia Preprocedure Evaluation (Addendum)
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     Pulmonary exam normal        Cardiovascular hypertension, On Medications Normal cardiovascular exam     Neuro/Psych  PSYCHIATRIC DISORDERS Anxiety Depression     Neuromuscular disease    GI/Hepatic Neg liver ROS,GERD  Controlled,,  Endo/Other  negative endocrine ROS    Renal/GU      Musculoskeletal   Abdominal   Peds  Hematology negative hematology ROS (+)   Anesthesia Other Findings Past Medical History: No date: Abnormal Pap smear of cervix No date: Anxiety 05/07/2022: C. difficile colitis 09/15/2020: Cancer of right femur (HCC) No date: Chronic diarrhea 03/23/2021: Closed fracture of distal end of left radius No date: Colon polyp No date: Depression No date: Diverticulosis No date: Essential hypertension 09/14/2020: Femur fracture, right (HCC) No date: GERD (gastroesophageal reflux disease) No date: Hyperlipidemia No date: Leukocytosis No date: Lumbar radiculopathy No date: Lung cancer [facility])     Comment:  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 10/2020: Metastatic cancer (HCC)     Comment:  lung, bone, lymph node, adrenal No date: Palpitations No date: Precordial chest pain 03/2021: Wrist fracture     Comment:  left  Past Surgical History: 2017: BREAST BIOPSY; Right     Comment:  benign No date: CERVICAL BIOPSY  W/ LOOP ELECTRODE EXCISION 06/11/2022: COLONOSCOPY     Comment:  Procedure: COLONOSCOPY;  Surgeon: Toney Reil,                MD;  Location: [facility] ENDOSCOPY;  Service:               Gastroenterology;; 07/03/2015: COLONOSCOPY WITH PROPOFOL; N/A     Comment:  Procedure: COLONOSCOPY WITH PROPOFOL;  Surgeon: Wallace Cullens, MD;  Location: [facility] ENDOSCOPY;  Service:               Gastroenterology;  Laterality: N/A; 01/06/2022: COLONOSCOPY WITH PROPOFOL; N/A     Comment:  Procedure: COLONOSCOPY WITH PROPOFOL;  Surgeon: Toney Reil, MD;  Location: [facility] ENDOSCOPY;  Service:               Gastroenterology;  Laterality: N/A; 07/03/2015: ESOPHAGOGASTRODUODENOSCOPY (EGD) WITH PROPOFOL; N/A     Comment:  Procedure: ESOPHAGOGASTRODUODENOSCOPY (EGD) WITH               PROPOFOL;  Surgeon: Wallace Cullens, MD;  Location: [facility]               ENDOSCOPY;  Service: Gastroenterology;  Laterality: N/A; 03/12/2022: FLEXIBLE SIGMOIDOSCOPY; N/A     Comment:  Procedure: FLEXIBLE SIGMOIDOSCOPY;  Surgeon: Toney Reil, MD;  Location: [facility] ENDOSCOPY;  Service:               Gastroenterology;  Laterality: N/A; 08/17/2022: FLEXIBLE SIGMOIDOSCOPY; N/A  Comment:  Procedure: FLEXIBLE SIGMOIDOSCOPY;  Surgeon: Toney Reil, MD;  Location: [facility] ENDOSCOPY;  Service:               Gastroenterology;  Laterality: N/A; 09/15/2020: INTRAMEDULLARY (IM) NAIL INTERTROCHANTERIC; Right     Comment:  Procedure: INTRAMEDULLARY (IM) NAIL INTERTROCHANTRIC;                Surgeon: Christena Flake, MD;  Location: [facility] ORS;                Service: Orthopedics;  Laterality: Right; 07/14/2022: IR CV LINE INJECTION 11/28/2020: IR IMAGING GUIDED PORT INSERTION 03/28/2017: LEFT HEART CATH AND CORONARY ANGIOGRAPHY; N/A     Comment:  Procedure: Left Heart Cath and Coronary Angiography;                Surgeon: Runell Gess, MD;  [facility]: [facility]              LAB;  Service: Cardiovascular;  Laterality: N/A; 03/31/2021: ORIF WRIST FRACTURE; Left     Comment:  Procedure: OPEN REDUCTION  INTERNAL FIXATION (ORIF) LEFT               DISTAL RADIUS FRACTURE.;  Surgeon: Christena Flake, MD;                Location: [facility] ORS;  Service: Orthopedics;  Laterality:               Left;  BMI    Body Mass Index: 27.12 kg/m      Reproductive/Obstetrics negative OB ROS                              Anesthesia Physical Anesthesia Plan  ASA: 3  Anesthesia Plan: Spinal   Post-op Pain Management: Regional block* and Ofirmev IV (intra-op)*   Induction:   PONV Risk Score and Plan: 2 and Propofol infusion, TIVA, Treatment may vary due to age or medical condition and Midazolam  Airway Management Planned: Natural Airway and Nasal Cannula  Additional Equipment:   Intra-op Plan:   Post-operative Plan:   Informed Consent: I have reviewed the patients History and Physical, chart, labs and discussed the procedure including the risks, benefits and alternatives for the proposed anesthesia with the patient or authorized representative who has indicated his/her understanding and acceptance.     Dental Advisory Given  Plan Discussed with: Anesthesiologist, CRNA and Surgeon  Anesthesia Plan Comments: (Patient reports no bleeding problems and no anticoagulant use.  Plan for spinal with backup GA  Patient consented for risks of anesthesia including but not limited to:  - adverse reactions to medications - damage to eyes, teeth, lips or other oral mucosa - nerve damage due to positioning  - risk of bleeding, infection and or nerve damage from spinal that could lead to paralysis - risk of headache or failed spinal - damage to teeth, lips or other oral mucosa - sore throat or hoarseness - damage to heart, brain, nerves, lungs, other parts of body or loss of life  Patient voiced understanding and assent.)        Anesthesia Quick Evaluation

## 2023-06-30 NOTE — Anesthesia Procedure Notes (Signed)
Date/Time: 06/30/2023 1:55 PM  Performed by: Malva Cogan, CRNAPre-anesthesia Checklist: Patient identified, Emergency Drugs available, Suction available, Patient being monitored and Timeout performed Patient Re-evaluated:Patient Re-evaluated prior to induction Oxygen Delivery Method: Nasal cannula Induction Type: IV induction Placement Confirmation: CO2 detector and positive ETCO2

## 2023-06-30 NOTE — Anesthesia Procedure Notes (Signed)
Spinal  Patient location during procedure: OR Start time: 06/30/2023 1:32 PM End time: 06/30/2023 1:43 PM Reason for block: surgical anesthesia Staffing Performed: resident/CRNA  Resident/CRNA: Malva Cogan, CRNA Performed by: Malva Cogan, CRNA Authorized by: Darleene Cleaver, Gerrit Heck, MD   Preanesthetic Checklist Completed: patient identified, IV checked, site marked, risks and benefits discussed, surgical consent, monitors and equipment checked, pre-op evaluation and timeout performed Spinal Block Patient position: sitting Prep: ChloraPrep and DuraPrep Patient monitoring: heart rate, cardiac monitor, continuous pulse ox and blood pressure Approach: midline Location: L3-4 Injection technique: single-shot Needle Needle type: Sprotte  Needle gauge: 25 G Needle length: 9 cm Assessment Sensory level: T4 Events: CSF return

## 2023-06-30 NOTE — H&P (Signed)
History of Present Illness:  Gina Sanchez is a 57 y.o. female who presents for evaluation and treatment of her left thigh pain. The patient notes that the symptoms developed about 6 to 8 weeks ago and developed without any specific cause or injury. She rates her pain at 8/10 and has been taking a pain patch (Durogesic) which provide some relief of her symptoms. The patient does have a history of metastatic lung cancer and is status post an intramedullary nailing of a prior pathologic fracture of her right femur in January, 2022, from which she healed well. The patient notes that her symptoms are worse with any prolonged standing or ambulation. She denies any numbness or paresthesias down her leg to her foot.  Current Outpatient Medications:  predniSONE (DELTASONE) 20 MG tablet Take 20 mg by mouth as directed  acetaminophen (TYLENOL) 325 MG tablet Take by mouth Take 1-2 tablets (325-650 mg total) by mouth every 6 (six) hours as needed for mild pain (pain score 1-3 or temp > 100.5).  ALPRAZolam (XANAX) 0.5 MG tablet Take 0.5 mg by mouth continuously as needed. 1  citalopram (CELEXA) 20 MG tablet Take 20 mg by mouth once daily  dexAMETHasone (DECADRON) 4 MG tablet Take 1 tab two times a day the day before Alimta chemo, then take 2 tabs once a day for 3 days starting the day after cisplatin.  fentaNYL (DURAGESIC) 25 mcg/hr patch Place 1 patch onto the skin every third day  folic acid (FOLVITE) 1 MG tablet (Patient not taking: Reported on 06/14/2023)  HYDROcodone-homatropine (HYCODAN) 5-1.5 mg/5 mL syrup Take 5 mLs by mouth every 6 (six) hours as needed  hydrocortisone (CORTEF) 20 MG tablet hydrocortisone 20 mg tablet  HYDROmorphone (DILAUDID) 2 MG tablet Take 2 mg by mouth as needed for Pain  levonorgestreL (MIRENA 52 MG) 20 mcg/24 hr (6 years) IUD Insert 1 Intra Uterine Device into the uterus continuously as needed  lidocaine (LIDODERM) 5 % patch as needed  lidocaine-prilocaine (EMLA) cream Apply 1 g  topically as directed  LORazepam (ATIVAN) 0.5 MG tablet Take 0.5 mg by mouth 2 (two) times daily as needed for Anxiety or Sleep  losartan (COZAAR) 50 MG tablet Take 50 mg by mouth once daily  metoprolol tartrate (LOPRESSOR) 25 MG tablet Take 1 tablet by mouth once daily (Patient not taking: Reported on 06/14/2023)  OLANZapine (ZYPREXA) 5 MG tablet Take 5 mg by mouth at bedtime  omeprazole (PRILOSEC) 20 MG DR capsule Take 20 mg by mouth once daily  ondansetron (ZOFRAN) 4 MG tablet Take 4 mg by mouth every 8 (eight) hours as needed Take 4 mg by mouth every 8 (eight) hours as needed for nausea or vomiting.  oxyCODONE (ROXICODONE) 5 MG immediate release tablet Take 5 mg by mouth every 4 (four) hours as needed Take 1-2 tablets (5-10 mg total) by mouth every 4 (four) hours as needed for moderate pain.  prochlorperazine (COMPAZINE) 10 MG tablet Take by mouth   Allergies:  Factive [Gemifloxacin] Itching   Past Medical History:  Chronic constipation  Closed fracture of distal end of left radius 03/23/2021  Closed fracture of right femur, unspecified fracture morphology, initial encounter (CMS/HHS-HCC) 09/14/2020  Colon polyp  Diverticulosis  Essential hypertension 04/27/2021  GAD (generalized anxiety disorder) 09/14/2020  Goals of care, counseling/discussion 10/19/2020  Hyperlipidemia 04/27/2021  Hypertension  Left wrist pain 03/23/2021  Leukocytosis 09/14/2020  Lumbar radiculopathy 08/06/2020  Mediastinal adenopathy 04/27/2021  Metastatic lung cancer (metastasis from lung to other site) (CMS/HHS-HCC) 10/19/2020  Last Assessment & Plan: Formatting of this note is different from the original. #Stage IV/metastatic lung cancer-currently status post Ledell Noss Alimta [awaiting repeat biopsy/NGS testing]. Unfortunately, repeat biopsy-again quantity not sufficient for NGS testing. Liquid biopsy negative #Recommend proceeding with carbo Alimta cycle #2; defer to Dr.Rao with regards to [facility]. 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 Right 09/15/2020 (Dr. Joice Lofts)  Open reduction and internal fixation of left distal radius fracture. Left 03/31/2021 (Dr.Noriko Macari)   Family History:  Diabetes type II Mother  Myocardial Infarction (Heart attack) Father  Colon cancer Maternal Grandfather  Skin cancer Maternal Grandfather   Social History:   Socioeconomic History:  Marital status: Married  Spouse name: Gene  Number of children: 2  Years of education: 12  Highest education level: High school graduate  Occupational History  Occupation: Part-time - Industrial/product designer  Tobacco Use  Smoking status: Former  Current packs/day: 0.00  Average packs/day: 0.5 packs/day for 20.0 years (10.0 ttl pk-yrs)  Types: Cigarettes  Start date: 09/17/2000  Quit date: 09/17/2020  Years since quitting: 2.7  Smokeless tobacco: Never  Vaping Use  Vaping status: Never Used  Substance and Sexual Activity  Alcohol use: Yes  Comment: 1 x a month  Drug use: Never  Sexual activity: Yes  Partners: Male   Social Drivers of Health:   Food Insecurity: No Food Insecurity (02/11/2023)  Received from [facility]  Hunger Vital Sign  Worried About Running Out of Food in the Last Year: Never true  Ran Out of Food in the Last Year: Never true  Transportation Needs: No Transportation Needs (02/11/2023)  Received from [facility]   Lack of Transportation (Medical): No  Lack of Transportation (Non-Medical): No   Review of Systems:  A comprehensive 14 point ROS was performed, reviewed, and the pertinent orthopaedic findings are documented in the HPI.  Physical Exam: Vitals:  06/27/23 1304  Weight: 79.8 kg (176 lb)  Height: 167.6 cm (5\' 6" )   General/Constitutional: The patient appears to be well-nourished, well-developed, and in no acute distress. Neuro/Psych: Normal mood and affect, oriented to person, place and time. Eyes: Non-icteric. Pupils are equal, round, and reactive to light, and exhibit synchronous movement. ENT: Unremarkable. Lymphatic: No palpable adenopathy. Respiratory: Lungs clear to auscultation, Normal chest excursion, No wheezes, and Non-labored breathing Cardiovascular: Regular rate and rhythm. No murmurs. and No edema, swelling or tenderness, except as noted in detailed exam. Integumentary: No impressive skin lesions present, except as noted in detailed exam. Musculoskeletal: Unremarkable, except as noted in detailed exam.  Left hip/thigh exam: Skin inspection of the left hip and thigh is unremarkable. No swelling, erythema, ecchymosis, abrasions, or other skin abnormalities are identified. She is able to perform a straight leg raise and exhibits pain-free motion of her hip both actively and passively. She has mild tenderness to palpation along the mid thigh region. She is grossly neurovascularly intact to the left lower extremity and foot.  X-rays/MRI/Lab data:  AP and lateral x-rays of the left femur are obtained. These films demonstrate several  areas of metastatic disease involving the femoral neck, subtrochanteric, and distal shaft regions of the left femur. The hip joint itself appears to be well-maintained and without evidence for significant degenerative changes. There are no cortical changes around any of the lesions at this time.  Assessment: 1. Primary malignant neoplasm of lung  metastatic to other site, unspecified laterality (CMS/HHS-HCC)  2. 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 In addition, patient educational materials were provided regarding the diagnosis and treatment options. The patient understands the concern for a pathologic fracture of the femur, given her experience on the other side, and is ready to consider more aggressive treatment options. Therefore, I have recommended a surgical seizure, specifically a prophylactic intramedullary nailing of the left femur. The procedure was discussed with the patient, as were the potential risks (including bleeding, infection, nerve and/or blood vessel injury, persistent or recurrent pain, loosening and/or failure of the components, need for further surgery, blood clots, strokes, heart attacks and/or arhythmias, pneumonia, etc.) and benefits. The patient states his/her understanding and wishes to proceed. All of the patient's questions and concerns were answered. She can call any time with further concerns. She will follow up post-surgery, routine.    H&P reviewed and patient re-examined. No changes.

## 2023-07-01 ENCOUNTER — Inpatient Hospital Stay: Payer: 59

## 2023-07-01 ENCOUNTER — Encounter: Payer: Self-pay | Admitting: Surgery

## 2023-07-01 ENCOUNTER — Other Ambulatory Visit: Payer: Self-pay | Admitting: Oncology

## 2023-07-01 ENCOUNTER — Inpatient Hospital Stay: Payer: 59 | Admitting: Oncology

## 2023-07-01 DIAGNOSIS — Z860100 Personal history of colon polyps, unspecified: Secondary | ICD-10-CM | POA: Diagnosis not present

## 2023-07-01 DIAGNOSIS — C3491 Malignant neoplasm of unspecified part of right bronchus or lung: Secondary | ICD-10-CM

## 2023-07-01 DIAGNOSIS — M84453A Pathological fracture, unspecified femur, initial encounter for fracture: Secondary | ICD-10-CM | POA: Diagnosis present

## 2023-07-01 DIAGNOSIS — Z975 Presence of (intrauterine) contraceptive device: Secondary | ICD-10-CM | POA: Diagnosis not present

## 2023-07-01 DIAGNOSIS — Z79899 Other long term (current) drug therapy: Secondary | ICD-10-CM | POA: Diagnosis not present

## 2023-07-01 DIAGNOSIS — M84553A Pathological fracture in neoplastic disease, unspecified femur, initial encounter for fracture: Secondary | ICD-10-CM | POA: Diagnosis not present

## 2023-07-01 DIAGNOSIS — Z79891 Long term (current) use of opiate analgesic: Secondary | ICD-10-CM | POA: Diagnosis not present

## 2023-07-01 DIAGNOSIS — M84552A Pathological fracture in neoplastic disease, left femur, initial encounter for fracture: Secondary | ICD-10-CM | POA: Diagnosis present

## 2023-07-01 DIAGNOSIS — Z7952 Long term (current) use of systemic steroids: Secondary | ICD-10-CM | POA: Diagnosis not present

## 2023-07-01 DIAGNOSIS — Z888 Allergy status to other drugs, medicaments and biological substances status: Secondary | ICD-10-CM | POA: Diagnosis not present

## 2023-07-01 DIAGNOSIS — C7951 Secondary malignant neoplasm of bone: Secondary | ICD-10-CM | POA: Diagnosis present

## 2023-07-01 DIAGNOSIS — E2749 Other adrenocortical insufficiency: Secondary | ICD-10-CM | POA: Diagnosis present

## 2023-07-01 DIAGNOSIS — Z7901 Long term (current) use of anticoagulants: Secondary | ICD-10-CM | POA: Diagnosis not present

## 2023-07-01 DIAGNOSIS — Z87311 Personal history of (healed) other pathological fracture: Secondary | ICD-10-CM | POA: Diagnosis not present

## 2023-07-01 DIAGNOSIS — Z8262 Family history of osteoporosis: Secondary | ICD-10-CM | POA: Diagnosis not present

## 2023-07-01 DIAGNOSIS — I10 Essential (primary) hypertension: Secondary | ICD-10-CM | POA: Diagnosis present

## 2023-07-01 DIAGNOSIS — F32A Depression, unspecified: Secondary | ICD-10-CM | POA: Diagnosis present

## 2023-07-01 DIAGNOSIS — Z808 Family history of malignant neoplasm of other organs or systems: Secondary | ICD-10-CM | POA: Diagnosis not present

## 2023-07-01 DIAGNOSIS — F411 Generalized anxiety disorder: Secondary | ICD-10-CM | POA: Diagnosis present

## 2023-07-01 DIAGNOSIS — R000 Tachycardia, unspecified: Secondary | ICD-10-CM | POA: Diagnosis not present

## 2023-07-01 DIAGNOSIS — K219 Gastro-esophageal reflux disease without esophagitis: Secondary | ICD-10-CM | POA: Diagnosis present

## 2023-07-01 DIAGNOSIS — Z800 Family history of malignant neoplasm of digestive organs: Secondary | ICD-10-CM | POA: Diagnosis not present

## 2023-07-01 DIAGNOSIS — Z8249 Family history of ischemic heart disease and other diseases of the circulatory system: Secondary | ICD-10-CM | POA: Diagnosis not present

## 2023-07-01 DIAGNOSIS — Z87891 Personal history of nicotine dependence: Secondary | ICD-10-CM | POA: Diagnosis not present

## 2023-07-01 DIAGNOSIS — E785 Hyperlipidemia, unspecified: Secondary | ICD-10-CM | POA: Diagnosis present

## 2023-07-01 DIAGNOSIS — Z833 Family history of diabetes mellitus: Secondary | ICD-10-CM | POA: Diagnosis not present

## 2023-07-01 DIAGNOSIS — C3490 Malignant neoplasm of unspecified part of unspecified bronchus or lung: Secondary | ICD-10-CM | POA: Diagnosis present

## 2023-07-01 MED ORDER — ACETAMINOPHEN 500 MG PO TABS
ORAL_TABLET | ORAL | Status: AC
  Filled 2023-07-01: qty 1

## 2023-07-01 MED ORDER — APIXABAN 2.5 MG PO TABS
ORAL_TABLET | ORAL | Status: AC
  Filled 2023-07-01: qty 1

## 2023-07-01 MED ORDER — KETOROLAC TROMETHAMINE 15 MG/ML IJ SOLN
INTRAMUSCULAR | Status: AC
  Filled 2023-07-01: qty 1

## 2023-07-01 MED ORDER — CEFAZOLIN SODIUM-DEXTROSE 2-4 GM/100ML-% IV SOLN
INTRAVENOUS | Status: AC
  Filled 2023-07-01: qty 100

## 2023-07-01 MED ORDER — ACETAMINOPHEN 500 MG PO TABS
ORAL_TABLET | ORAL | Status: AC
  Filled 2023-07-01: qty 2

## 2023-07-01 MED ORDER — HYDROMORPHONE HCL 2 MG PO TABS
ORAL_TABLET | ORAL | Status: AC
  Filled 2023-07-01: qty 2

## 2023-07-01 MED ORDER — ALPRAZOLAM 0.5 MG PO TABS
ORAL_TABLET | ORAL | Status: AC
  Filled 2023-07-01: qty 1

## 2023-07-01 MED ORDER — APIXABAN 2.5 MG PO TABS: 2.5000 mg | ORAL_TABLET | Freq: Two times a day (BID) | ORAL | 0 refills | Status: DC

## 2023-07-01 MED ORDER — HYDROMORPHONE HCL 2 MG PO TABS
ORAL_TABLET | ORAL | Status: AC
  Filled 2023-07-01: qty 1

## 2023-07-01 MED ORDER — DOCUSATE SODIUM 100 MG PO CAPS
ORAL_CAPSULE | ORAL | Status: AC
  Filled 2023-07-01: qty 1

## 2023-07-01 NOTE — Evaluation (Signed)
Occupational Therapy Evaluation Patient Details Name: Gina Sanchez MRN: [id number] DOB: Nov 25, 1965 Today's Date: 07/01/2023   History of Present Illness Patient is a 57 y.o. female who presents for evaluation and treatment of her left thigh pain. The patient notes that the symptoms developed about 6 to 8 weeks ago and developed without any specific cause or injury. Current MD assessment: Left prophylactic intramedullary nailing of impending left subtrochanteric femur fracture.   Clinical Impression   Gina Sanchez was seen for OT evaluation this date. Prior to hospital admission, pt was IND. Pt currently requires CGA + RW for BSC t/f and pericare in sitting. MIN A don B socks in sitting. Pt would benefit from skilled OT to address noted impairments and functional limitations (see below for any additional details). Upon hospital discharge, recommend no OT follow up.    If plan is discharge home, recommend the following: A little help with walking and/or transfers;A little help with bathing/dressing/bathroom    Functional Status Assessment  Patient has had a recent decline in their functional status and demonstrates the ability to make significant improvements in function in a reasonable and predictable amount of time.  Equipment Recommendations  BSC/3in1    Recommendations for Other Services       Precautions / Restrictions Precautions Precautions: Fall Restrictions Weight Bearing Restrictions: Yes LLE Weight Bearing: Weight bearing as tolerated      Mobility Bed Mobility Overal bed mobility: Needs Assistance Bed Mobility: Sit to Supine       Sit to supine: Min assist        Transfers Overall transfer level: Needs assistance Equipment used: Rolling walker (2 wheels) Transfers: Sit to/from Stand Sit to Stand: Contact guard assist                  Balance Overall balance assessment: Needs assistance   Sitting balance-Leahy Scale: Good     Standing balance  support: Bilateral upper extremity supported, During functional activity, Reliant on assistive device for balance Standing balance-Leahy Scale: Fair                             ADL either performed or assessed with clinical judgement   ADL Overall ADL's : Needs assistance/impaired                                       General ADL Comments: CGA + RW for BSC t/f and pericare in sitting. MIN A don B socks in sitting      Pertinent Vitals/Pain Pain Assessment Pain Assessment: 0-10 Pain Score: 5  Pain Location: L hip Pain Descriptors / Indicators: Aching, Sore Pain Intervention(s): Limited activity within patient's tolerance, Repositioned     Extremity/Trunk Assessment Upper Extremity Assessment Upper Extremity Assessment: Overall WFL for tasks assessed   Lower Extremity Assessment Lower Extremity Assessment: Generalized weakness       Communication Communication Communication: No apparent difficulties   Cognition Arousal: Alert Behavior During Therapy: Flat affect Overall Cognitive Status: Within Functional Limits for tasks assessed                                                  Home Living Family/patient expects to be discharged to:: Private residence  Living Arrangements: Spouse/significant other Available Help at Discharge: Family;Available 24 hours/day Type of Home: House Home Access: Stairs to enter [employer] of Steps: 3 Entrance Stairs-Rails: Can reach both;Left;Right Home Layout: One level     Bathroom Shower/Tub: Producer, television/film/video: Standard Bathroom Accessibility: Yes   Home Equipment: Agricultural consultant (2 wheels);Shower seat          Prior Functioning/Environment Prior Level of Function : Independent/Modified Independent             Mobility Comments: full community amb ADLs Comments: Ind        OT Problem List: Decreased strength      OT  Treatment/Interventions: Self-care/ADL training;Therapeutic exercise;Energy conservation;DME and/or AE instruction    OT Goals(Current goals can be found in the care plan section) Acute Rehab OT Goals Patient Stated Goal: to go home OT Goal Formulation: With patient Time For Goal Achievement: 07/15/23 Potential to Achieve Goals: Good ADL Goals Pt Will Perform Grooming: with modified independence;standing Pt Will Perform Lower Body Dressing: with modified independence;sit to/from stand Pt Will Transfer to Toilet: with modified independence;ambulating;regular height toilet  OT Frequency: Min 1X/week    Co-evaluation              AM-PAC OT "6 Clicks" Daily Activity     Outcome Measure Help from another person eating meals?: None Help from another person taking care of personal grooming?: None Help from another person toileting, which includes using toliet, bedpan, or urinal?: A Little Help from another person bathing (including washing, rinsing, drying)?: A Little Help from another person to put on and taking off regular upper body clothing?: None Help from another person to put on and taking off regular lower body clothing?: A Little 6 Click Score: 21   End of Session Equipment Utilized During Treatment: Rolling walker (2 wheels) Nurse Communication: Mobility status  Activity Tolerance: Patient tolerated treatment well Patient left: in bed;with call bell/phone within reach  OT Visit Diagnosis: Other abnormalities of gait and mobility (R26.89);Muscle weakness (generalized) (M62.81)                Time: 2130-8657 OT Time Calculation (min): 15 min Charges:  OT General Charges $OT Visit: 1 Visit OT Evaluation $OT Eval Low Complexity: 1 Low  Kathie Dike, M.S. OTR/L  07/01/23, 3:57 PM  ascom [phone]

## 2023-07-01 NOTE — Discharge Summary (Signed)
Physician Discharge Summary  Patient ID: Gina Sanchez MRN: [id number] DOB/AGE: 11-12-1965 57 y.o.  Admit date: 06/30/2023 Discharge date: 07/03/2023  Admission Diagnoses:  Pathologic fracture of part of femur [facility]) [X91.478G]   Discharge Diagnoses: Patient Active Problem List   Diagnosis Date Noted   Pathologic fracture of part of femur (HCC) 06/30/2023   Cancer related pain 03/10/2023   Poor appetite 03/10/2023   Sinus tachycardia 02/10/2023   Secondary adenocarcinoma of lung (HCC)    Indeterminate colitis    Closed fracture of distal end of left radius 03/23/2021   Metastatic lung cancer (metastasis from lung to other site) (HCC) 10/19/2020   Goals of care, counseling/discussion 10/19/2020   Pathologic fracture    Mediastinal adenopathy    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   Metastatic cancer (HCC) 10/2020   lung, bone, lymph node, adrenal   Palpitations    Precordial chest pain    Wrist fracture 03/2021   left     Transfusion: none    Consultants (if any): Treatment Team:  Doristine Johns, [facility] Team 5, MD Charise Killian, MD  Discharged Condition: Improved  Hospital Course: Gina Sanchez is an 57 y.o. female who was admitted 06/30/2023 with a diagnosis of Pathologic fracture of part of femur (HCC) and went to the operating room on 06/30/2023 and underwent the above named procedures.    Surgeries: Procedure(s): Left prophylactic intramedullary nailing of impending left subtrochanteric femur fracture on 06/30/2023 Patient tolerated the surgery well. Taken to PACU where she was stabilized and then transferred to the orthopedic floor.  Started on Eliquis. TEDs and SCDs applied bilaterally. Heels elevated on bed. No evidence of DVT. Negative Homan. Physical therapy started on day #1 for gait training and transfer. OT started day #1 for ADL and assisted devices.  Patient made slow progress of physical therapy on postop day 1.  On postop day 2 patient noted to be tachycardic, heart rate 130.  Medicine was consulted and after evaluation and reviewing her chart it was determined that tachycardia was likely due to absence of beta-blocker and adrenal insufficiency.  She was given a stress dose of hydrocortisone and discontinued off her losartan and started back on a beta-blocker.  On postop day 3.  Heart rate improved back to baseline.  Patient was doing well, asymptomatic.  She made good progress of physical therapy.  No complaints of chest pain shortness of breath.  Patient eager to go home.  Patient stable and  ready for discharge to home.  Patient was able to safely and independently complete all PT goals. PT recommending discharge to home.   On post op day #3 patient was stable and ready for discharge to home with HHPT.  Implants: Prophylactic intramedullary nailing of left femur with [employer] TFN nail.    She was given perioperative antibiotics:  Anti-infectives (From admission, onward)    Start     Dose/Rate Route  Frequency Ordered Stop   06/30/23 2000  ceFAZolin (ANCEF) IVPB 2g/100 mL premix        2 g 200 mL/hr over 30 Minutes Intravenous Every 6 hours 06/30/23 1817 07/01/23 0904   06/30/23 1115  ceFAZolin (ANCEF) IVPB 2g/100 mL premix        2 g 200 mL/hr over 30 Minutes Intravenous On call to O.R. 06/30/23 1102 06/30/23 1415     .  She was given sequential compression devices, early ambulation, and Eliquis TEDs for DVT prophylaxis.  She benefited maximally from the hospital stay and there were no complications.    Recent vital signs:  Vitals:   07/03/23 0424 07/03/23 0746  BP: 108/77 116/84  Pulse: (!) 101 (!) 101  Resp: 18 16  Temp: 98.7 F (37.1 C) 97.7 F (36.5 C)  SpO2: 95% 99%    Recent laboratory studies:  Lab Results  Component Value Date   HGB 9.9 (L) 07/02/2023   HGB 10.4 (L) 06/15/2023   HGB 9.7 (L) 05/23/2023   Lab Results  Component Value Date   WBC 10.4 07/02/2023   PLT 217 07/02/2023   Lab Results  Component Value Date   INR 1.1 02/10/2023   Lab Results  Component Value Date   NA 134 (L) 06/15/2023   K 3.9 06/15/2023   CL 101 06/15/2023   CO2 22 06/15/2023   BUN 16 06/15/2023   CREATININE 0.69 06/15/2023   GLUCOSE 109 (H) 06/15/2023    Discharge Medications:   Allergies as of 07/03/2023       Reactions   Factive [gemifloxacin] Rash        Medication List     STOP taking these medications    losartan 50 MG tablet Commonly known as: COZAAR       TAKE these medications    acetaminophen 500 MG tablet Commonly known as: TYLENOL Take 500 mg by mouth every 6 (six) hours as needed.   ALPRAZolam 0.5 MG tablet Commonly known as: XANAX TAKE 1 TABLET BY MOUTH DAILY AS NEEDED FOR ANXIETY.   apixaban 2.5 MG Tabs tablet Commonly known as: ELIQUIS Take 1 tablet (2.5 mg total) by mouth 2 (two) times daily for 14 days.   citalopram 10 MG tablet Commonly known as: CELEXA Take 1 tablet (10 mg total) by mouth daily. What changed: when to  take this   Entyvio 108 MG/0.68ML Sopn Generic drug: Vedolizumab Inject 1 Pen into the skin every 14 (fourteen) days. What changed: when to take this   fentaNYL 25 MCG/HR Commonly known as: DURAGESIC Place 1 patch onto the skin every 3 (three) days.   HYDROcodone bit-homatropine 5-1.5 MG/5ML syrup Commonly known as: Hycodan Take 5 mLs by mouth every 6 (six) hours as needed for cough.   hydrocortisone 10 MG tablet Commonly known as: CORTEF TAKE 1 TABLET BY MOUTH NIGHTLY   hydrocortisone 20 MG tablet Commonly known as: CORTEF TAKE 1 TABLET BY MOUTH EVERY MORNING   HYDROmorphone 2 MG tablet Commonly known as: Dilaudid Take 1-2 tablets (2-4 mg total)  by mouth every 4 (four) hours as needed for severe pain.   lidocaine 5 % Commonly known as: LIDODERM Place 1 patch onto the skin daily. Remove & Discard patch within 12 hours or as directed by MD   metoprolol succinate 25 MG 24 hr tablet Commonly known as: TOPROL-XL Take 1 tablet (25 mg total) by mouth daily.   OLANZapine 5 MG tablet Commonly known as: ZyPREXA Take 1 tablet (5 mg total) by mouth at bedtime.   ondansetron 4 MG tablet Commonly known as: ZOFRAN TAKE 1 TABLET BY MOUTH EVERY 8 HOURS AS NEEDED FOR NAUSEA AND VOMITING What changed:  how much to take how to take this when to take this reasons to take this   prochlorperazine 10 MG tablet Commonly known as: COMPAZINE Take 1 tablet (10 mg total) by mouth every 6 (six) hours as needed for nausea or vomiting.               Durable Medical Equipment  (From admission, onward)           Start     Ordered   06/30/23 1818  DME Walker rolling  Once       Question Answer Comment  Walker: With 5 Inch Wheels   Patient needs a walker to treat with the following condition Pathologic fracture of part of femur (HCC)      06/30/23 1817   06/30/23 1818  DME Bedside commode  Once       Question:  Patient needs a bedside commode to treat with the following  condition  Answer:  Pathologic fracture of part of femur (HCC)   06/30/23 1817   06/30/23 1818  DME 3 n 1  Once        06/30/23 1817            Diagnostic Studies: DG Chest 1 View  Result Date: 07/02/2023 CLINICAL DATA:  Tachycardia EXAM: CHEST  1 VIEW COMPARISON:  None Available. FINDINGS: Abnormal mediastinal silhouette, compatible with known adenopathy. New somewhat nodular opacity in the right midlung. No visible pleural effusions or pneumothorax. Polyarticular degenerative change. IMPRESSION: 1. New somewhat nodular opacity in the right midlung which could represent progressive metastatic disease, treatment change, and/or infection. CT of the chest with contrast could further evaluate if clinically warranted 2. Abnormal mediastinal silhouette, compatible with known lymphadenopathy. Electronically Signed   By: Feliberto Harts M.D.   On: 07/02/2023 13:02   DG HIP UNILAT WITH PELVIS 2-3 VIEWS LEFT  Result Date: 06/30/2023 CLINICAL DATA:  Left hip surgery.  Intraoperative fluoroscopy. EXAM: DG HIP (WITH OR WITHOUT PELVIS) 2-3V LEFT COMPARISON:  Left femur radiographs 04/20/2023 FINDINGS: Images were performed intraoperatively without the presence of a radiologist. Interval left femoral long intramedullary nail fixation, spanning the multiple previously seen sclerotic left femoral bone diaphyses. No hardware complication is seen. Total fluoroscopy images: 4 Total fluoroscopy time: 120 seconds Total dose: Radiation Exposure Index (as provided by the fluoroscopic device): 20.73 mGy air Kerma Please see intraoperative findings for further detail. IMPRESSION: Intraoperative fluoroscopy for left femoral long intramedullary nail fixation. Electronically Signed   By: Neita Garnet M.D.   On: 06/30/2023 16:44   DG C-Arm 1-60 Min-No Report  Result Date: 06/30/2023 Fluoroscopy was utilized by the requesting physician.  No radiographic interpretation.   DG C-Arm 1-60 Min-No Report  Result Date:  06/30/2023 Fluoroscopy was utilized by the requesting physician.  No radiographic interpretation.    Disposition: Discharge disposition: 06-Home-Health Care Svc  Follow-up Information     Anson [place], PA-C Follow up in 2 week(s).   Specialty: Physician Assistant Contact information: [address] [place] [address] [phone]                  Signed: Patience Musca 07/03/2023, 9:40 AM

## 2023-07-01 NOTE — Discharge Instructions (Signed)
Diet: As you were doing prior to hospitalization   Shower:  May shower but avoid submerging dressing/incision under water  Activity:  Increase activity slowly as tolerated, but follow the weight bearing instructions below.  No lifting or driving for 6 weeks.  Weight Bearing:   Weight bearing as tolerated to left lower extremity  To prevent constipation: you may use a stool softener such as -  Colace (over the counter) 100 mg by mouth twice a day  Drink plenty of fluids (prune juice may be helpful) and high fiber foods Miralax (over the counter) for constipation as needed.    Itching:  If you experience itching with your medications, try taking only a single pain pill, or even half a pain pill at a time.  You may take up to 10 pain pills per day, and you can also use benadryl over the counter for itching or also to help with sleep.   Precautions:  If you experience chest pain or shortness of breath - call 911 immediately for transfer to the hospital emergency department!!  If you develop a fever greater that 101 F, purulent drainage from wound, increased redness or drainage from wound, or calf pain-Call [facility]                                               Follow- Up Appointment:  Please call for an appointment to be seen in 2 weeks at [facility]

## 2023-07-01 NOTE — Plan of Care (Signed)
Documented

## 2023-07-01 NOTE — Discharge Summary (Incomplete Revision)
Physician Discharge Summary  Patient ID: Gina Sanchez MRN: [id number] DOB/AGE: 56-Jan-1967 56 y.o.  Admit date: 06/30/2023 Discharge date: ***  Admission Diagnoses:  Pathologic fracture of part of femur [facility]) [V25.366Y]   Discharge Diagnoses: Patient Active Problem List   Diagnosis Date Noted   Pathologic fracture of part of femur (HCC) 06/30/2023   Cancer related pain 03/10/2023   Poor appetite 03/10/2023   Sinus tachycardia 02/10/2023   Secondary adenocarcinoma of lung (HCC)    Indeterminate colitis    Closed fracture of distal end of left radius 03/23/2021   Metastatic lung cancer (metastasis from lung to other site) (HCC) 10/19/2020   Goals of care, counseling/discussion 10/19/2020   Pathologic fracture    Mediastinal adenopathy    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   Metastatic cancer (HCC) 10/2020   lung, bone, lymph node, adrenal   Palpitations    Precordial chest pain    Wrist fracture 03/2021   left     Transfusion: none   Consultants  (if any):   Discharged Condition: Improved  Hospital Course: Gina Sanchez is an 57 y.o. female who was admitted 06/30/2023 with a diagnosis of Pathologic fracture of part of femur (HCC) and went to the operating room on 06/30/2023 and underwent the above named procedures.    Surgeries: Procedure(s): Left prophylactic intramedullary nailing of impending left subtrochanteric femur fracture on 06/30/2023 Patient tolerated the surgery well. Taken to PACU where she was stabilized and then transferred to the orthopedic floor.  Started on Eliquis. TEDs and SCDs applied bilaterally. Heels elevated on bed. No evidence of DVT. Negative Homan. Physical therapy started on day #1 for gait training and transfer. OT started day #1 for ADL and assisted devices.  Patient's IV was d/c on day #1. Patient was able to safely and independently complete all PT goals. PT recommending discharge to home.   On post op day #1 patient was stable and ready for discharge to home with HHPT. Implants: Prophylactic intramedullary nailing of left femur with Biomet Affixis TFN nail.    She was given perioperative antibiotics:  Anti-infectives (From admission, onward)    Start     Dose/Rate Route Frequency Ordered Stop   06/30/23 2000  ceFAZolin (ANCEF) IVPB 2g/100 mL premix        2 g 200 mL/hr over 30 Minutes Intravenous Every 6 hours 06/30/23 1817 07/01/23 1359   06/30/23 1115  ceFAZolin (ANCEF) IVPB 2g/100 mL premix  2 g 200 mL/hr over 30 Minutes Intravenous On call to O.R. 06/30/23 1102 06/30/23 1415     .  She was given sequential compression devices, early ambulation, and Eliquis TEDs for DVT prophylaxis.  She benefited maximally from the hospital stay and there were no complications.    Recent vital signs:  Vitals:   07/01/23 0245 07/01/23 0615  BP: 115/70 129/84  Pulse:  (!) 101  Resp:  16  Temp:  98.3 F (36.8 C)  SpO2: 97% 94%    Recent laboratory studies:  Lab Results  Component  Value Date   HGB 10.4 (L) 06/15/2023   HGB 9.7 (L) 05/23/2023   HGB 9.0 (L) 04/20/2023   Lab Results  Component Value Date   WBC 6.5 06/15/2023   PLT 304 06/15/2023   Lab Results  Component Value Date   INR 1.1 02/10/2023   Lab Results  Component Value Date   NA 134 (L) 06/15/2023   K 3.9 06/15/2023   CL 101 06/15/2023   CO2 22 06/15/2023   BUN 16 06/15/2023   CREATININE 0.69 06/15/2023   GLUCOSE 109 (H) 06/15/2023    Discharge Medications:   Allergies as of 07/01/2023       Reactions   Factive [gemifloxacin] Rash        Medication List     TAKE these medications    acetaminophen 500 MG tablet Commonly known as: TYLENOL Take 500 mg by mouth every 6 (six) hours as needed.   ALPRAZolam 0.5 MG tablet Commonly known as: XANAX TAKE 1 TABLET BY MOUTH DAILY AS NEEDED FOR ANXIETY.   apixaban 2.5 MG Tabs tablet Commonly known as: ELIQUIS Take 1 tablet (2.5 mg total) by mouth 2 (two) times daily for 14 days.   citalopram 10 MG tablet Commonly known as: CELEXA Take 1 tablet (10 mg total) by mouth daily. What changed: when to take this   Entyvio 108 MG/0.68ML Sopn Generic drug: Vedolizumab Inject 1 Pen into the skin every 14 (fourteen) days. What changed: when to take this   fentaNYL 25 MCG/HR Commonly known as: DURAGESIC Place 1 patch onto the skin every 3 (three) days.   HYDROcodone bit-homatropine 5-1.5 MG/5ML syrup Commonly known as: Hycodan Take 5 mLs by mouth every 6 (six) hours as needed for cough.   hydrocortisone 10 MG tablet Commonly known as: CORTEF TAKE 1 TABLET BY MOUTH NIGHTLY   hydrocortisone 20 MG tablet Commonly known as: CORTEF TAKE 1 TABLET BY MOUTH EVERY MORNING   HYDROmorphone 2 MG tablet Commonly known as: Dilaudid Take 1-2 tablets (2-4 mg total) by mouth every 4 (four) hours as needed for severe pain.   lidocaine 5 % Commonly known as: LIDODERM Place 1 patch onto the skin daily. Remove & Discard patch within 12 hours or as  directed by MD   losartan 50 MG tablet Commonly known as: COZAAR Take 50 mg by mouth daily.   OLANZapine 5 MG tablet Commonly known as: ZyPREXA Take 1 tablet (5 mg total) by mouth at bedtime.   ondansetron 4 MG tablet Commonly known as: ZOFRAN TAKE 1 TABLET BY MOUTH EVERY 8 HOURS AS NEEDED FOR NAUSEA AND VOMITING What changed:  how much to take how to take this when to take this reasons to take this   prochlorperazine 10 MG tablet Commonly known as: COMPAZINE Take 1 tablet (10 mg total) by mouth every 6 (six) hours as needed for nausea or vomiting.  Durable Medical Equipment  (From admission, onward)           Start     Ordered   06/30/23 1818  DME Walker rolling  Once       Question Answer Comment  Walker: With 5 Inch Wheels   Patient needs a walker to treat with the following condition Pathologic fracture of part of femur (HCC)      06/30/23 1817   06/30/23 1818  DME Bedside commode  Once       Question:  Patient needs a bedside commode to treat with the following condition  Answer:  Pathologic fracture of part of femur (HCC)   06/30/23 1817   06/30/23 1818  DME 3 n 1  Once        06/30/23 1817            Diagnostic Studies: DG HIP UNILAT WITH PELVIS 2-3 VIEWS LEFT  Result Date: 06/30/2023 CLINICAL DATA:  Left hip surgery.  Intraoperative fluoroscopy. EXAM: DG HIP (WITH OR WITHOUT PELVIS) 2-3V LEFT COMPARISON:  Left femur radiographs 04/20/2023 FINDINGS: Images were performed intraoperatively without the presence of a radiologist. Interval left femoral long intramedullary nail fixation, spanning the multiple previously seen sclerotic left femoral bone diaphyses. No hardware complication is seen. Total fluoroscopy images: 4 Total fluoroscopy time: 120 seconds Total dose: Radiation Exposure Index (as provided by the fluoroscopic device): 20.73 mGy air Kerma Please see intraoperative findings for further detail. IMPRESSION: Intraoperative  fluoroscopy for left femoral long intramedullary nail fixation. Electronically Signed   By: Neita Garnet M.D.   On: 06/30/2023 16:44   DG C-Arm 1-60 Min-No Report  Result Date: 06/30/2023 Fluoroscopy was utilized by the requesting physician.  No radiographic interpretation.   DG C-Arm 1-60 Min-No Report  Result Date: 06/30/2023 Fluoroscopy was utilized by the requesting physician.  No radiographic interpretation.    Disposition:      Follow-up Information     Anson Oregon, PA-C Follow up in 2 week(s).   Specialty: Physician Assistant Contact information: [address] [place] [address] [phone]                  Signed: Patience Musca 07/01/2023, 8:07 AM

## 2023-07-01 NOTE — Anesthesia Postprocedure Evaluation (Signed)
Anesthesia Post Note  Patient: Gina Sanchez  Procedure(s) Performed: Left prophylactic intramedullary nailing of impending left subtrochanteric femur fracture (Left: Hip)  Patient location during evaluation: Phase II Anesthesia Type: Spinal Level of consciousness: awake and alert and oriented Pain management: satisfactory to patient Vital Signs Assessment: post-procedure vital signs reviewed and stable Respiratory status: respiratory function stable Cardiovascular status: stable Postop Assessment: no headache, no backache, patient able to bend at knees, no apparent nausea or vomiting, able to ambulate and adequate PO intake Anesthetic complications: no   No notable events documented.   Last Vitals:  Vitals:   07/01/23 0245 07/01/23 0615  BP: 115/70 129/84  Pulse:  (!) 101  Resp:  16  Temp:  36.8 C  SpO2: 97% 94%    Last Pain:  Vitals:   07/01/23 0356  TempSrc:   PainSc: Stephan Minister

## 2023-07-01 NOTE — Progress Notes (Signed)
   Subjective: 1 Day Post-Op Procedure(s) (LRB): Left prophylactic intramedullary nailing of impending left subtrochanteric femur fracture (Left) Patient reports pain as mild.   Patient is well, and has had no acute complaints or problems Denies any CP, SOB, ABD pain. We will continue therapy today.  Plan is to go Home after hospital stay.  Objective: Vital signs in last 24 hours: Temp:  [97 F (36.1 C)-98.5 F (36.9 C)] 98.3 F (36.8 C) (10/18 0615) Pulse Rate:  [97-122] 101 (10/18 0615) Resp:  [11-20] 16 (10/18 0615) BP: (93-129)/(65-111) 129/84 (10/18 0615) SpO2:  [93 %-100 %] 94 % (10/18 0615) Weight:  [76.2 kg] 76.2 kg (10/17 1132)  Intake/Output from previous day: 10/17 0701 - 10/18 0700 In: 620 [P.O.:120; I.V.:500] Out: 750 [Urine:650; Blood:100] Intake/Output this shift: No intake/output data recorded.  No results for input(s): "HGB" in the last 72 hours. No results for input(s): "WBC", "RBC", "HCT", "PLT" in the last 72 hours. No results for input(s): "NA", "K", "CL", "CO2", "BUN", "CREATININE", "GLUCOSE", "CALCIUM" in the last 72 hours. No results for input(s): "LABPT", "INR" in the last 72 hours.  EXAM General - Patient is Alert, Appropriate, and Oriented Extremity - Neurovascular intact Sensation intact distally Intact pulses distally Dorsiflexion/Plantar flexion intact Dressing - dressing C/D/I and no drainage Motor Function - intact, moving foot and toes well on exam.   Past Medical History:  Diagnosis Date   Abnormal Pap smear of cervix    Anxiety    C. 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   Chronic diarrhea    Closed fracture of distal end of left radius 03/23/2021   Colon polyp    Depression    Diverticulosis    Essential hypertension    Femur fracture, right (HCC) 09/14/2020   GERD (gastroesophageal reflux disease)    Hyperlipidemia    Leukocytosis    Lumbar radiculopathy    Lung cancer (HCC)    metastasis  to bone and adrenal gland   Metastatic cancer (HCC) 10/2020   lung, bone, lymph node, adrenal   Palpitations    Precordial chest pain    Wrist fracture 03/2021   left    Assessment/Plan:   1 Day Post-Op Procedure(s) (LRB): Left prophylactic intramedullary nailing of impending left subtrochanteric femur fracture (Left) Principal Problem:   Pathologic fracture of part of femur (HCC)  Estimated body mass index is 27.12 kg/m as calculated from the following:   Height as of this encounter: 5\' 6"  (1.676 m).   Weight as of this encounter: 76.2 kg. Advance diet Up with therapy VSS Pain well controlled CM to assist with discharge to home with HHPT today pending safe completion of PT goals   DVT Prophylaxis - TED hose and Eliquis Weight-Bearing as tolerated to left leg   T. Cranston Neighbor, PA-C [facility] 07/01/2023, 7:59 AM

## 2023-07-01 NOTE — Plan of Care (Signed)
CHL Tonsillectomy/Adenoidectomy, Postoperative PEDS care plan entered in error.

## 2023-07-01 NOTE — Evaluation (Signed)
Physical Therapy Evaluation Patient Details Name: Gina Sanchez MRN: [id number] DOB: 1966/09/11 Today's Date: 07/01/2023  History of Present Illness  Patient is a 57 y.o. female who presents for evaluation and treatment of her left thigh pain. The patient notes that the symptoms developed about 6 to 8 weeks ago and developed without any specific cause or injury. Current MD assessment: Left prophylactic intramedullary nailing of impending left subtrochanteric femur fracture.  Clinical Impression  Pt was pleasant and slightly anxious of moving, but willing to participate during the session and put forth good effort throughout. She is currently Mod I for bed mobility with extended time. She needs Min A for the X2 STS's performed from lowered bed surface. Upon standing with first STS, pt very apprehensive to place weight on LLE, VC's provided for pt to breathe easy and relax grip on RW, pt reports feeling "weird" when standing; attempted to gather BP in standing but unable to gather accurate measurement. Once pt seated and resting for ~ 2 min pt BP and HR was WNL. Upon second STS pt able to take a few steps sideways and forwards/backwards with RW and CGA using a Step-to pattern. Pt will benefit from continued PT services upon discharge to safely address deficits listed in patient problem list for decreased caregiver assistance and eventual return to PLOF.          If plan is discharge home, recommend the following: Assist for transportation;Help with stairs or ramp for entrance;A little help with walking and/or transfers;A little help with bathing/dressing/bathroom;Assistance with cooking/housework   Can travel by private vehicle   Yes    Equipment Recommendations BSC/3in1;Other (comment) (likely benefit from [facility] if home; TBD for next venue of care otherwise)  Recommendations for Other Services       Functional Status Assessment Patient has had a recent decline in their functional status and  demonstrates the ability to make significant improvements in function in a reasonable and predictable amount of time.     Precautions / Restrictions Precautions Precautions: None Restrictions Weight Bearing Restrictions: Yes LLE Weight Bearing: Weight bearing as tolerated      Mobility  Bed Mobility Overal bed mobility: Modified Independent Bed Mobility: Supine to Sit, Sit to Supine     Supine to sit: Modified independent (Device/Increase time) Sit to supine: Modified independent (Device/Increase time)   General bed mobility comments: Pt performed mod I with extensive time    Transfers Overall transfer level: Needs assistance Equipment used: Rolling walker (2 wheels) Transfers: Sit to/from Stand Sit to Stand: Min assist                Ambulation/Gait Ambulation/Gait assistance: Contact guard assist Gait Distance (Feet): 5 Feet   Gait Pattern/deviations: Step-to pattern Gait velocity: dsecreased   Pre-gait activities: Weight shifting once standing General Gait Details: Pt able to take a few steps to the sides, forwards and backwards, overall apprehensive but able to follow step-to commands for forwards/backwards amb. Pt having notable decreased stance time on LLE but improved with VC's.  Stairs            Wheelchair Mobility     Tilt Bed    Modified Rankin (Stroke Patients Only)       Balance Overall balance assessment: Needs assistance   Sitting balance-Leahy Scale: Good       Standing balance-Leahy Scale: Fair Standing balance comment: Static standing with RW  Pertinent Vitals/Pain Pain Assessment Pain Assessment: 0-10 Pain Score: 8  Pain Location: L hip = 5/10 R foot = 8/10 Pain Descriptors / Indicators: Aching, Sore, Guarding, Discomfort Pain Intervention(s): Monitored during session, Premedicated before session, Limited activity within patient's tolerance    Home Living Family/patient expects  to be discharged to:: Private residence Living Arrangements: Spouse/significant other Available Help at Discharge: Family;Available 24 hours/day Type of Home: House Home Access: Stairs to enter Entrance Stairs-Rails: Can reach both;Left;Right Entrance Stairs-Number of Steps: 3   Home Layout: One level Home Equipment: Agricultural consultant (2 wheels);Shower seat      Prior Function Prior Level of Function : Independent/Modified Independent             Mobility Comments: full community amb ADLs Comments: Ind     Extremity/Trunk Assessment   Upper Extremity Assessment Upper Extremity Assessment: Overall WFL for tasks assessed    Lower Extremity Assessment Lower Extremity Assessment: Generalized weakness;LLE deficits/detail LLE Deficits / Details: WBAT; pt apprehensive to distribute weight to LLE       Communication   Communication Communication: No apparent difficulties  Cognition Arousal: Alert Behavior During Therapy: WFL for tasks assessed/performed, Anxious Overall Cognitive Status: Within Functional Limits for tasks assessed                                          General Comments      Exercises     Assessment/Plan    PT Assessment Patient needs continued PT services  PT Problem List Decreased strength;Decreased coordination;Decreased range of motion;Decreased activity tolerance;Decreased balance;Decreased mobility       PT Treatment Interventions DME instruction;Balance training;Gait training;Stair training;Functional mobility training;Therapeutic activities;Therapeutic exercise    PT Goals (Current goals can be found in the Care Plan section)  Acute Rehab PT Goals Patient Stated Goal: walk without pain PT Goal Formulation: With patient Time For Goal Achievement: 07/14/23 Potential to Achieve Goals: Fair    Frequency BID     Co-evaluation               AM-PAC PT "6 Clicks" Mobility  Outcome Measure Help needed turning from  your back to your side while in a flat bed without using bedrails?: A Little Help needed moving from lying on your back to sitting on the side of a flat bed without using bedrails?: A Little Help needed moving to and from a bed to a chair (including a wheelchair)?: A Little Help needed standing up from a chair using your arms (e.g., wheelchair or bedside chair)?: A Little Help needed to walk in hospital room?: A Little Help needed climbing 3-5 steps with a railing? : A Lot 6 Click Score: 17    End of Session Equipment Utilized During Treatment: Gait belt Activity Tolerance: Patient tolerated treatment well;Other (comment) (limited by apprehension) Patient left: in bed;with nursing/sitter in room;with bed alarm set;with call bell/phone within reach;with SCD's reapplied Nurse Communication: Mobility status;Other (comment) (Pt reports of new pain in R Foot) PT Visit Diagnosis: Other abnormalities of gait and mobility (R26.89);Difficulty in walking, not elsewhere classified (R26.2);Unsteadiness on feet (R26.81)    Time: 5284-1324 PT Time Calculation (min) (ACUTE ONLY): 39 min   Charges:                 Cecile Sheerer, SPT 07/01/23, 1:38 PM

## 2023-07-01 NOTE — Progress Notes (Signed)
Physical Therapy Treatment Patient Details Name: Gina Sanchez MRN: [id number] DOB: March 22, 1966 Today's Date: 07/01/2023   History of Present Illness Patient is a 57 y.o. female who presents for evaluation and treatment of her left thigh pain. The patient notes that the symptoms developed about 6 to 8 weeks ago and developed without any specific cause or injury. Current MD assessment: Left prophylactic intramedullary nailing of impending left subtrochanteric femur fracture.    PT Comments  Pt was pleasant and motivated to participate during the session and put forth good effort throughout. Pt required cuing for proper sequencing with transfers and gait but no physical assistance.  Pt was able to improve her max amb distance to 20 feet this session but with slow, cautious steps and mod lean on the RW for support.  Pt declined to attempt stair training this session secondary to not feeling physically strong enough to perform steps safely.  Will defer until next session as appropriate.  Pt reported no adverse symptoms during the session other than LLE pain.  Pt will benefit from continued PT services upon discharge to safely address deficits listed in patient problem list for decreased caregiver assistance and eventual return to PLOF.      If plan is discharge home, recommend the following: Assist for transportation;Help with stairs or ramp for entrance;A little help with walking and/or transfers;A little help with bathing/dressing/bathroom;Assistance with cooking/housework   Can travel by private vehicle     Yes  Equipment Recommendations  BSC/3in1    Recommendations for Other Services       Precautions / Restrictions Precautions Precautions: Fall Restrictions Weight Bearing Restrictions: Yes LLE Weight Bearing: Weight bearing as tolerated     Mobility  Bed Mobility               General bed mobility comments: NT, pt in recliner    Transfers Overall transfer level: Needs  assistance Equipment used: Rolling walker (2 wheels) Transfers: Sit to/from Stand Sit to Stand: Contact guard assist           General transfer comment: Min to mod verbal cues for sequencing with fair to good eccentric and concentric control with BUE assist    Ambulation/Gait Ambulation/Gait assistance: Contact guard assist Gait Distance (Feet): 20 Feet Assistive device: Rolling walker (2 wheels) Gait Pattern/deviations: Step-to pattern, Step-through pattern, Decreased stance time - left, Decreased step length - right, Antalgic Gait velocity: dsecreased     General Gait Details: Step-to pattern initially that quickly progressed to beginning step-to pattern but with decreased LLE stance time, mildly to moderately antalgic; 90 deg turn training x 4 for proper positioning/technique    Stairs Stairs: Yes       General stair comments: Pt declined to attempt stair training this session, requested to defer until next session   Wheelchair Mobility     Tilt Bed    Modified Rankin (Stroke Patients Only)       Balance Overall balance assessment: Needs assistance   Sitting balance-Leahy Scale: Good     Standing balance support: Bilateral upper extremity supported, During functional activity, Reliant on assistive device for balance Standing balance-Leahy Scale: Fair                              Cognition Arousal: Alert Behavior During Therapy: Flat affect Overall Cognitive Status: Within Functional Limits for tasks assessed  Exercises Total Joint Exercises Ankle Circles/Pumps: AROM, Strengthening, Both, 5 reps, 10 reps Quad Sets: Strengthening, Both, 5 reps, 10 reps Gluteal Sets: Strengthening, Both, 5 reps, 10 reps Long Arc Quad: Strengthening, Both, 10 reps Knee Flexion: Strengthening, Both, 10 reps Other Exercises Other Exercises: HEP education for BLE APs, QS, and GS x 10 each every 1-2 hrs  daily    General Comments        Pertinent Vitals/Pain Pain Assessment Pain Assessment: 0-10 Pain Score: 5  Pain Location: LLE, no R foot pain this session Pain Descriptors / Indicators: Aching, Sore Pain Intervention(s): Repositioned, Premedicated before session, Monitored during session    Home Living                          Prior Function            PT Goals (current goals can now be found in the care plan section) Progress towards PT goals: Progressing toward goals    Frequency    BID      PT Plan      Co-evaluation              AM-PAC PT "6 Clicks" Mobility   Outcome Measure  Help needed turning from your back to your side while in a flat bed without using bedrails?: A Little Help needed moving from lying on your back to sitting on the side of a flat bed without using bedrails?: A Little Help needed moving to and from a bed to a chair (including a wheelchair)?: A Little Help needed standing up from a chair using your arms (e.g., wheelchair or bedside chair)?: A Little Help needed to walk in hospital room?: A Little Help needed climbing 3-5 steps with a railing? : A Lot 6 Click Score: 17    End of Session Equipment Utilized During Treatment: Gait belt Activity Tolerance: Patient tolerated treatment well Patient left: in chair;Other (comment) (Pt left with OT at end of session) Nurse Communication: Mobility status;Weight bearing status PT Visit Diagnosis: Other abnormalities of gait and mobility (R26.89);Difficulty in walking, not elsewhere classified (R26.2);Unsteadiness on feet (R26.81);Pain;Muscle weakness (generalized) (M62.81) Pain - Right/Left: Left Pain - part of body: Leg     Time: 4010-2725 PT Time Calculation (min) (ACUTE ONLY): 20 min  Charges:    $Gait Training: 8-22 mins PT General Charges $$ ACUTE PT VISIT: 1 Visit                     D. Scott Amanda Pote PT, DPT 07/01/23, 3:42 PM

## 2023-07-02 ENCOUNTER — Inpatient Hospital Stay: Payer: 59

## 2023-07-02 DIAGNOSIS — R000 Tachycardia, unspecified: Secondary | ICD-10-CM | POA: Diagnosis not present

## 2023-07-02 LAB — URINALYSIS, COMPLETE (UACMP) WITH MICROSCOPIC
Bacteria, UA: NONE SEEN
Bilirubin Urine: NEGATIVE
Glucose, UA: NEGATIVE mg/dL
Hgb urine dipstick: NEGATIVE
Ketones, ur: NEGATIVE mg/dL
Nitrite: NEGATIVE
Protein, ur: NEGATIVE mg/dL
Specific Gravity, Urine: 1.021 (ref 1.005–1.030)
pH: 6 (ref 5.0–8.0)

## 2023-07-02 LAB — CBC
HCT: 30.4 % — ABNORMAL LOW (ref 36.0–46.0)
Hemoglobin: 9.9 g/dL — ABNORMAL LOW (ref 12.0–15.0)
MCH: 28 pg (ref 26.0–34.0)
MCHC: 32.6 g/dL (ref 30.0–36.0)
MCV: 86.1 fL (ref 80.0–100.0)
Platelets: 217 10*3/uL (ref 150–400)
RBC: 3.53 MIL/uL — ABNORMAL LOW (ref 3.87–5.11)
RDW: 16.1 % — ABNORMAL HIGH (ref 11.5–15.5)
WBC: 10.4 10*3/uL (ref 4.0–10.5)
nRBC: 0 % (ref 0.0–0.2)

## 2023-07-02 LAB — LACTIC ACID, PLASMA: Lactic Acid, Venous: 1.6 mmol/L (ref 0.5–1.9)

## 2023-07-02 MED ORDER — HYDROCORTISONE 10 MG PO TABS
40.0000 mg | ORAL_TABLET | Freq: Every morning | ORAL | Status: DC
  Administered 2023-07-02 – 2023-07-03 (×2): 40 mg via ORAL
  Filled 2023-07-02 (×2): qty 4

## 2023-07-02 MED ORDER — HYDROCORTISONE 10 MG PO TABS
20.0000 mg | ORAL_TABLET | Freq: Every day | ORAL | Status: DC
  Administered 2023-07-02: 20 mg via ORAL
  Filled 2023-07-02: qty 2

## 2023-07-02 MED ORDER — METOPROLOL SUCCINATE ER 25 MG PO TB24: 25.0000 mg | ORAL_TABLET | Freq: Every day | ORAL | 0 refills | Status: DC

## 2023-07-02 MED ORDER — METOPROLOL SUCCINATE ER 25 MG PO TB24
25.0000 mg | ORAL_TABLET | Freq: Every day | ORAL | Status: DC
  Administered 2023-07-02 – 2023-07-03 (×2): 25 mg via ORAL
  Filled 2023-07-02 (×2): qty 1

## 2023-07-02 NOTE — Progress Notes (Signed)
Physical Therapy Treatment Patient Details Name: Gina Sanchez MRN: [id number] DOB: 03-Dec-1965 Today's Date: 07/02/2023   History of Present Illness Patient is a 57 y.o. female who presents for evaluation and treatment of her left thigh pain. The patient notes that the symptoms developed about 6 to 8 weeks ago and developed without any specific cause or injury. Current MD assessment: Left prophylactic intramedullary nailing of impending left subtrochanteric femur fracture.    PT Comments  Pt was more alert during this pm session, needing less time to respond to questions and initiate mobility tasks.  Pt amb. with RW from recliner <>bathroom (71ftx2) performing bathroom hygiene with 1-0 UE support at an overall CGA to Supervision level.  Pt declined further walking or stair negotiation reporting fatigue.  Continued PT will assist pt towards greater activity tolerance and safety with mobility.    If plan is discharge home, recommend the following: Assist for transportation;Help with stairs or ramp for entrance;A little help with walking and/or transfers;A little help with bathing/dressing/bathroom;Assistance with cooking/housework   Can travel by private vehicle     Yes  Equipment Recommendations  BSC/3in1    Recommendations for Other Services       Precautions / Restrictions Precautions Precautions: Fall Restrictions Weight Bearing Restrictions: Yes LLE Weight Bearing: Weight bearing as tolerated     Mobility  Bed Mobility Overal bed mobility: Needs Assistance Bed Mobility: Sit to Supine, Supine to Sit     Supine to sit: Max assist Sit to supine: Max assist   General bed mobility comments: NT, pt up in recliner.    Transfers Overall transfer level: Needs assistance Equipment used: Rolling walker (2 wheels) Transfers: Sit to/from Stand, Bed to chair/wheelchair/BSC Sit to Stand: Contact guard assist   Step pivot transfers: Contact guard assist       General  transfer comment: Min cues for hand placement.    Ambulation/Gait Ambulation/Gait assistance: Contact guard assist Gait Distance (Feet): 15 Feet (x2) Assistive device: Rolling walker (2 wheels) Gait Pattern/deviations: Step-to pattern, Decreased stance time - left, Decreased step length - right, Antalgic Gait velocity: decreased, cues to decrease UE support as able.     General Gait Details: Step-to pattern with decreased LLE stance time, mildly to moderately antalgic   Stairs         General stair comments: unable pt demonstrating poor activity tolerance.   Wheelchair Mobility     Tilt Bed    Modified Rankin (Stroke Patients Only)       Balance Overall balance assessment: Needs assistance Sitting-balance support: Single extremity supported, Feet supported Sitting balance-Leahy Scale: Good     Standing balance support: During functional activity, Reliant on assistive device for balance, No upper extremity supported Standing balance-Leahy Scale: Good Standing balance comment: performed hand hygiene at bathroom sink.                            Cognition Arousal: Alert Behavior During Therapy: Flat affect                                   General Comments: More alert pm session.  Less time needed to respond to questions and able to follow simple commands better.        Exercises      General Comments        Pertinent Vitals/Pain Pain Assessment Pain Score: 7  Pain Location: L hip Pain Descriptors / Indicators: Aching, Sore Pain Intervention(s): Monitored during session    Home Living                          Prior Function            PT Goals (current goals can now be found in the care plan section) Acute Rehab PT Goals Patient Stated Goal: walk without pain PT Goal Formulation: With patient Time For Goal Achievement: 07/14/23 Potential to Achieve Goals: Fair Progress towards PT goals: Progressing toward  goals    Frequency    BID      PT Plan      Co-evaluation              AM-PAC PT "6 Clicks" Mobility   Outcome Measure  Help needed turning from your back to your side while in a flat bed without using bedrails?: A Lot Help needed moving from lying on your back to sitting on the side of a flat bed without using bedrails?: A Lot Help needed moving to and from a bed to a chair (including a wheelchair)?: A Little Help needed standing up from a chair using your arms (e.g., wheelchair or bedside chair)?: A Little Help needed to walk in hospital room?: A Little Help needed climbing 3-5 steps with a railing? : A Lot 6 Click Score: 15    End of Session Equipment Utilized During Treatment: Gait belt Activity Tolerance: Patient limited by lethargy;Patient limited by pain Patient left: in chair;with chair alarm set;with family/visitor present;with call bell/phone within reach Nurse Communication: Mobility status;Weight bearing status PT Visit Diagnosis: Other abnormalities of gait and mobility (R26.89);Difficulty in walking, not elsewhere classified (R26.2);Unsteadiness on feet (R26.81);Pain;Muscle weakness (generalized) (M62.81) Pain - Right/Left: Left Pain - part of body: Leg     Time: 1412-1430 PT Time Calculation (min) (ACUTE ONLY): 18 min  Charges:    $Gait Training: 8-22 mins PT General Charges $$ ACUTE PT VISIT: 1 Visit                     Hortencia Conradi, PTA  07/02/23, 2:52 PM

## 2023-07-02 NOTE — Progress Notes (Signed)
Physical Therapy Treatment Patient Details Name: Gina Sanchez MRN: [id number] DOB: [birth year] Today's Date: 07/02/2023   History of Present Illness Patient is a 57 y.o. female who presents for evaluation and treatment of her left thigh pain. The patient notes that the symptoms developed about 6 to 8 weeks ago and developed without any specific cause or injury. Current MD assessment: Left prophylactic intramedullary nailing of impending left subtrochanteric femur fracture.    PT Comments  Pt seems more lethargic this session, pt required multi modal cues for sequencing and body mechanics with a lot of repeating for simple commands for all mobility tasks.  Pt required Max assist for bed mobility and min A with transfers, CGA for gait with RW.  Pt demonstrates fatigue wanting to lie back down in bed at end of session and reported severe LLE pain with movement. Continued PT will assist pt towards greater activity tolerance and safety with functional mobility.    If plan is discharge home, recommend the following: Assist for transportation;Help with stairs or ramp for entrance;A little help with walking and/or transfers;A little help with bathing/dressing/bathroom;Assistance with cooking/housework   Can travel by private vehicle     Yes  Equipment Recommendations  BSC/3in1    Recommendations for Other Services       Precautions / Restrictions Precautions Precautions: Fall Restrictions Weight Bearing Restrictions: Yes LLE Weight Bearing: Weight bearing as tolerated     Mobility  Bed Mobility Overal bed mobility: Needs Assistance Bed Mobility: Sit to Supine, Supine to Sit     Supine to sit: Max assist Sit to supine: Max assist   General bed mobility comments: pt needed multi modal cues for sequencing and body mechanics with a lot of repeating for simple commands.    Transfers   Equipment used: Rolling walker (2 wheels) Transfers: Sit to/from Stand, Bed to  chair/wheelchair/BSC Sit to Stand: Min assist   Step pivot transfers: Min assist       General transfer comment: Min to mod verbal cues for sequencing with fair to good eccentric and concentric control with BUE assist    Ambulation/Gait Ambulation/Gait assistance: Contact guard assist Gait Distance (Feet): 10 Feet (x2) Assistive device: Rolling walker (2 wheels) Gait Pattern/deviations: Step-to pattern, Decreased stance time - left, Decreased step length - right, Antalgic Gait velocity: dsecreased     General Gait Details: Step-to pattern with decreased LLE stance time, mildly to moderately antalgic   Stairs         General stair comments: unable pt demonstrating poor activity tolerance and reporting severe LLE pain.   Wheelchair Mobility     Tilt Bed    Modified Rankin (Stroke Patients Only)       Balance Overall balance assessment: Needs assistance Sitting-balance support: Single extremity supported, Feet supported Sitting balance-Leahy Scale: Good     Standing balance support: Bilateral upper extremity supported, During functional activity, Reliant on assistive device for balance Standing balance-Leahy Scale: Fair Standing balance comment: Static standing with RW                            Cognition Arousal: Lethargic Behavior During Therapy: Flat affect                                   General Comments: Pt required increased time to respond to questions, often needing simple commands repeated this session.  Exercises      General Comments        Pertinent Vitals/Pain Pain Assessment Pain Score: 10-Worst pain ever Pain Location: L hip Pain Descriptors / Indicators: Aching, Sore Pain Intervention(s): Limited activity within patient's tolerance, Monitored during session, Repositioned    Home Living                          Prior Function            PT Goals (current goals can now be found in  the care plan section) Acute Rehab PT Goals Patient Stated Goal: walk without pain PT Goal Formulation: With patient Potential to Achieve Goals: Fair Progress towards PT goals: Not progressing toward goals - comment (pt needed multi modal cues for sequencing and body mechanics with a lot of repeating for simple commands for all mobility tasks and required more assist wit bed mobility this session.)    Frequency    BID      PT Plan      Co-evaluation              AM-PAC PT "6 Clicks" Mobility   Outcome Measure  Help needed turning from your back to your side while in a flat bed without using bedrails?: A Lot Help needed moving from lying on your back to sitting on the side of a flat bed without using bedrails?: A Lot Help needed moving to and from a bed to a chair (including a wheelchair)?: A Little Help needed standing up from a chair using your arms (e.g., wheelchair or bedside chair)?: A Little Help needed to walk in hospital room?: A Little Help needed climbing 3-5 steps with a railing? : A Lot 6 Click Score: 15    End of Session Equipment Utilized During Treatment: Gait belt Activity Tolerance: Patient limited by lethargy;Patient limited by pain Patient left: in bed;with bed alarm set;with family/visitor present;with call bell/phone within reach Nurse Communication: Mobility status;Weight bearing status PT Visit Diagnosis: Other abnormalities of gait and mobility (R26.89);Difficulty in walking, not elsewhere classified (R26.2);Unsteadiness on feet (R26.81);Pain;Muscle weakness (generalized) (M62.81) Pain - Right/Left: Left Pain - part of body: Leg     Time: 8295-6213 PT Time Calculation (min) (ACUTE ONLY): 39 min  Charges:    $Gait Training: 8-22 mins $Therapeutic Activity: 23-37 mins PT General Charges $$ ACUTE PT VISIT: 1 Visit                      Hortencia Conradi, PTA  07/02/23, 10:36 AM

## 2023-07-02 NOTE — TOC Initial Note (Signed)
Transition of Care [facility]) - Initial/Assessment Note    Patient Details  Name: Gina Sanchez MRN: [id number] Date of Birth: 1966/07/12  Transition of Care [facility]) CM/SW Contact:    Liliana Cline, LCSW Phone Number: 07/02/2023, 12:52 PM  Clinical Narrative:                 Met with patient's daughter in law Beverely Low) and patient at bedside. Patient sleeping. Jeannie called patient's spouse (Gnee) on speaker phone to be involved in conversation.  Patient lives at home with Gene who provides transportation. PCP is Franco Nones. Pharmacy is [facility]. Patient has a RW and shower chair at home. No SNF history. Gene and Jeannie understand the rec for SNF. They decline SNF, state they feel comfortable bringing patient home with [facility]. Patient was pre arranged with [facility] by the Surgeon's Office and Gene states they have already scheduled a visit with him. Gene is agreeable to 3in1 rec, requests home delivery and is aware it may take a few days (versus hospital room delivery). 3in1 ordered through [employer] with [employer] for home delivery.   Expected Discharge Plan: Home w Home Health Services Barriers to Discharge: Continued Medical Work up   Patient Goals and CMS Choice   CMS Medicare.gov Compare Post Acute Care list provided to:: Patient Choice offered to / list presented to : Patient      Expected Discharge Plan and Services In-house Referral: NA Discharge Planning Services: CM Consult   Living arrangements for the past 2 months: Single Family Home Expected Discharge Date: 07/01/23               DME Arranged: 3-N-1         HH Arranged: PT, OT HH Agency: [facility] Date HH Agency Contacted: 07/02/23   Representative spoke with at [facility] Agency: [place]  Prior Living Arrangements/Services Living arrangements for the past 2 months: Single Family Home Lives with:: Spouse Patient language and need for interpreter reviewed:: Yes Do you feel safe going back to the  place where you live?: Yes      Need for Family Participation in Patient Care: Yes (Comment) Care giver support system in place?: Yes (comment) Current home services: DME Criminal Activity/Legal Involvement Pertinent to Current Situation/Hospitalization: No - Comment as needed  Activities of Daily Living   ADL Screening (condition at time of admission) Independently performs ADLs?: Yes (appropriate for developmental age) Is the patient deaf or have difficulty hearing?: No Does the patient have difficulty seeing, even when wearing glasses/contacts?: No Does the patient have difficulty concentrating, remembering, or making decisions?: No  Permission Sought/Granted Permission sought to share information with : Facility Industrial/product designer granted to share information with : Yes, Verbal Permission Granted (by spouse)     Permission granted to share info w AGENCY: [facility], DME agencies        Emotional Assessment         Alcohol / Substance Use: Not Applicable Psych Involvement: No (comment)  Admission diagnosis:  Pathologic fracture of part of femur [facility]) [N82.956O] Patient Active Problem List   Diagnosis Date Noted   Pathologic fracture of part of femur (HCC) 06/30/2023   Cancer related pain 03/10/2023   Poor appetite 03/10/2023   Sinus tachycardia 02/10/2023   Secondary adenocarcinoma of lung (HCC)    Indeterminate colitis    Closed fracture of distal end of left radius 03/23/2021   Metastatic lung cancer (metastasis from lung to other site) [facility])  10/19/2020   Goals of care, counseling/discussion 10/19/2020   Pathologic fracture    Mediastinal adenopathy    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) 09/15/2020   Closed fracture of right femur, unspecified fracture morphology, initial encounter (HCC) 09/14/2020   Right femoral shaft fracture (HCC) 09/14/2020   Right hip pain 09/14/2020   GAD (generalized anxiety disorder)  09/14/2020   Lumbar radiculopathy 08/06/2020   Tobacco use 03/29/2017   Unstable angina (HCC) 03/25/2017   Essential hypertension    Hyperlipidemia    PCP:  Armando Gang, FNP Pharmacy:   [facility], [place] - [address] [place] [address] Phone: [phone] Fax: [phone]  [facility] - [place], [place] - [address] [place] [address] Phone: [phone] Fax: [phone]  [facility] - [place], [place] - [address] [place] [address] Phone: [phone] Fax: [phone]  [facility] - [facility] [address] [place] [address] Phone: [phone] Fax: [phone]  [facility] - [place], [place] - [address] [place] [address] Phone: [phone] Fax: [phone]     Social Determinants of Health (SDOH) Social History: SDOH Screenings   Food Insecurity: No Food Insecurity (06/30/2023)  Housing: Low Risk  (06/30/2023)  Transportation Needs: No Transportation Needs (06/30/2023)  Utilities: Not At Risk (06/30/2023)  Tobacco Use: Medium Risk (06/30/2023)   SDOH Interventions:     Readmission Risk Interventions    07/02/2023   12:51 PM  Readmission Risk Prevention Plan  Transportation Screening Complete  PCP or Specialist Appt within 5-7 Days Complete  Home Care Screening Complete  Medication Review (RN CM) Complete

## 2023-07-02 NOTE — Progress Notes (Signed)
   Subjective: 2 Days Post-Op Procedure(s) (LRB): Left prophylactic intramedullary nailing of impending left subtrochanteric femur fracture (Left) Patient reports pain as mild.   Patient with elevated heart rate.  Hospitalist consulted.  Patient denies any palpitations, shortness of breath, chest pain, diaphoresis, nausea, vomiting, abdominal pain. We will continue therapy today.  Plan is to go Home after hospital stay.  Objective: Vital signs in last 24 hours: Temp:  [98.3 F (36.8 C)-99.7 F (37.6 C)] 99.7 F (37.6 C) (10/19 0610) Pulse Rate:  [104-130] 130 (10/19 0610) Resp:  [14-20] 20 (10/19 0610) BP: (97-125)/(71-88) 125/84 (10/19 0610) SpO2:  [93 %-98 %] 93 % (10/19 0610)  Intake/Output from previous day: 10/18 0701 - 10/19 0700 In: -  Out: 200 [Urine:200] Intake/Output this shift: No intake/output data recorded.  No results for input(s): "HGB" in the last 72 hours. No results for input(s): "WBC", "RBC", "HCT", "PLT" in the last 72 hours. No results for input(s): "NA", "K", "CL", "CO2", "BUN", "CREATININE", "GLUCOSE", "CALCIUM" in the last 72 hours. No results for input(s): "LABPT", "INR" in the last 72 hours.  EXAM General - Patient is Alert, Appropriate, and lethargic. Extremity - Neurovascular intact Sensation intact distally Intact pulses distally Dorsiflexion/Plantar flexion intact Dressing - dressing C/D/I and no drainage Motor Function - intact, moving foot and toes well on exam.   Past Medical History:  Diagnosis Date   Abnormal Pap smear of cervix    Anxiety    C. 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   Weight as of this encounter: 76.2 kg. Advance diet Up with therapy Pain well controlled, patient is a little lethargic.  Would recommend cutting back on p.o. Dilaudid, avoiding 4 mg dosages. Tachycardic, hospitalist consulted.  Patient with history of baseline tachycardia.  Will start beta-blocker and will double hydrocortisone dosage today. CM to assist with discharge to home with HHPT.   DVT Prophylaxis - TED hose and Eliquis Weight-Bearing as tolerated to left leg   T. Cranston Neighbor, PA-C [facility] 07/02/2023, 8:21 AM

## 2023-07-02 NOTE — Progress Notes (Signed)
Due to yellow MEWS, charge RN will take care of this patient for remainder of the shift.    07/02/23 0610  Assess: MEWS Score  Temp 99.7 F (37.6 C)  BP 125/84  MAP (mmHg) 97  Pulse Rate (!) 130  Resp 20  SpO2 93 %  Assess: MEWS Score  MEWS Temp 0  MEWS Systolic 0  MEWS Pulse 3  MEWS RR 0  MEWS LOC 0  MEWS Score 3  MEWS Score Color Yellow  Assess: if the MEWS score is Yellow or Red  Were vital signs accurate and taken at a resting state? Yes  Does the patient meet 2 or more of the SIRS criteria? No  MEWS guidelines implemented  Yes, yellow  Treat  MEWS Interventions Considered administering scheduled or prn medications/treatments as ordered  Take Vital Signs  Increase Vital Sign Frequency  Yellow: Q2hr x1, continue Q4hrs until patient remains green for 12hrs  Escalate  MEWS: Escalate Yellow: Discuss with charge nurse and consider notifying provider and/or RRT  Notify: Charge Nurse/RN  Name of Charge Nurse/RN Notified Erie Noe, RN  Provider Notification  Provider Name/Title Dr. Ernest Pine  Date Provider Notified 07/02/23  Time Provider Notified [phone]  Method of Notification Page  Notification Reason Other (Comment) (Pt HR 130, yellow 3 MEWS)  Provider response See new orders  Date of Provider Response 07/02/23  Time of Provider Response 0620  Assess: SIRS CRITERIA  SIRS Temperature  0  SIRS Pulse 1  SIRS Respirations  0  SIRS WBC 0  SIRS Score Sum  1

## 2023-07-02 NOTE — Consult Note (Signed)
Initial Consultation Note   Patient: Gina Sanchez:[id number] DOB: [birth year] PCP: Armando Gang, FNP DOA: 06/30/2023 DOS: the patient was seen and examined on 07/02/2023 Primary service: Christena Flake, MD  Referring physician: Dr. Ernest Pine Reason for consult: Sinus tachycardia  Assessment/Plan:  Sinus tachycardia -Appears the patient has chronic sinus tachycardia with baseline heart rate 102-107 on 2 separate office visit on October 4 and October 14.  The patient told me that about 2 to 3 weeks ago her blood pressure medication was switched from metoprolol to losartan but she could not tell me about the details.  According to to records from [facility] of a recent oncology office visit on October 1, to follow was listed as her medication however on the visit patient reported that she is not taking metoprolol anymore and her heart rate was 107 on that  day's visit.  I believe her current tachycardia likely secondary to absence of beta blocker. Will D/C losartan and resume metoprolol XL 25 mg daily. 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  Hydrocortisone dosage was not adjusted during the surgery, which probably can cause tachycardia, or other stress like reactions. Given that she is post op day 2 and her current BP stable, will double her home dose of hydrocortisone.  Low suspicion for adrenal crisis, will not initiate stress dose of hydrocortisone -Will also check CBC, lactic acid and UA, chest xray.  To rule out acute anemia, hypoperfusion. -Patient reported that her surgical pain has been fairly controlled and denied any feeling of anxious. -She has also been started on prophylactic Eliquis since her hip surgery, DVT/PE risk is considered to be low at this point. Her most recent CTA study was in May this year, and was negative.  Metastatic stage IV lung  adenocarcinoma Impending pathological hip fracture -Pain controlled, on Eliquis for DVT prophylaxis -Outpatient follow-up with oncology   From medical point of view, if patient heart rate becomes controlled this afternoon, she can go home with p.o. metoprolol.  And she can switch back to her normal dose of hydrocortisone tomorrow. Will recommend she takes double dose of evening hydrocortisone for today only.  HPI: Gina Sanchez is a 57 y.o. female with past medical history of metastatic hip fracture came to the hospital for elective prophylactic intramedullary nailing of the left femoral to prevent progress of a lytic femoral lesion to cause impending fracture.  Patient does have a history of HTN and baseline sinus tachycardia.  Her tachycardia showed can be traced back to May this year, and a CTA was done at that point showed no PE.  Patient underwent left hip surgery 2 days ago and she recovered well after surgery and pain has been fairly controlled.  This morning however patient started to develop a tachycardia.  Patient however has no complaint of palpitations.  She is afebrile, and her surgical site has been clean denies any urinary symptoms, no cough no diarrhea.  Review of Systems: As mentioned in the history of present illness. All other systems reviewed and are negative. Past Medical History:  Diagnosis Date   Abnormal Pap smear of cervix    Anxiety    C. 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   Chronic diarrhea    Closed fracture of distal end of left radius 03/23/2021   Colon polyp    Depression    Diverticulosis  Essential hypertension    Femur fracture, right (HCC) 09/14/2020   GERD (gastroesophageal reflux disease)    Hyperlipidemia    Leukocytosis    Lumbar radiculopathy    Lung cancer (HCC)    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   Metastatic cancer (HCC) 10/2020   lung, bone, lymph node, adrenal   Palpitations    Precordial chest  pain    Wrist fracture 03/2021   left   Past Surgical History:  Procedure Laterality Date   BREAST BIOPSY Right 2017   benign   CERVICAL BIOPSY  W/ LOOP ELECTRODE EXCISION     COLONOSCOPY  06/11/2022   Procedure: COLONOSCOPY;  Surgeon: Toney Reil, MD;  Location: [facility] ENDOSCOPY;  Service: Gastroenterology;;   COLONOSCOPY WITH PROPOFOL N/A 07/03/2015   Procedure: COLONOSCOPY WITH PROPOFOL;  Surgeon: Wallace Cullens, MD;  Location: [facility] ENDOSCOPY;  Service: Gastroenterology;  Laterality: N/A;   COLONOSCOPY WITH PROPOFOL N/A 01/06/2022   Procedure: COLONOSCOPY WITH PROPOFOL;  Surgeon: Toney Reil, MD;  Location: [facility] ENDOSCOPY;  Service: Gastroenterology;  Laterality: N/A;   ESOPHAGOGASTRODUODENOSCOPY (EGD) WITH PROPOFOL N/A 07/03/2015   Procedure: ESOPHAGOGASTRODUODENOSCOPY (EGD) WITH PROPOFOL;  Surgeon: Wallace Cullens, MD;  Location[facility] ENDOSCOPY;  Service: Gastroenterology;  Laterality: N/A;   FEMUR IM NAIL Left 06/30/2023   Procedure: Left prophylactic intramedullary nailing of impending left subtrochanteric femur fracture;  Surgeon: Christena Flake, MD;  Location: [facility] ORS;  Service: Orthopedics;  Laterality: Left;   FLEXIBLE SIGMOIDOSCOPY N/A 03/12/2022   Procedure: FLEXIBLE SIGMOIDOSCOPY;  Surgeon: Toney Reil, MD;  Location: [facility] ENDOSCOPY;  Service: Gastroenterology;  Laterality: N/A;   FLEXIBLE SIGMOIDOSCOPY N/A 08/17/2022   Procedure: FLEXIBLE SIGMOIDOSCOPY;  Surgeon: Toney Reil, MD;  Location: [facility] ENDOSCOPY;  Service: Gastroenterology;  Laterality: N/A;   INTRAMEDULLARY (IM) NAIL INTERTROCHANTERIC Right 09/15/2020   Procedure: INTRAMEDULLARY (IM) NAIL INTERTROCHANTRIC;  Surgeon: Christena Flake, MD;  Location: [facility] ORS;  Service: Orthopedics;  Laterality: Right;   IR CV LINE INJECTION  07/14/2022   IR IMAGING GUIDED PORT INSERTION  11/28/2020   LEFT HEART CATH AND CORONARY ANGIOGRAPHY N/A 03/28/2017   Procedure: Left Heart Cath and Coronary Angiography;   Surgeon: Runell Gess, MD;  Location: [facility] INVASIVE CV LAB;  Service: Cardiovascular;  Laterality: N/A;   ORIF WRIST FRACTURE Left 03/31/2021   Procedure: OPEN REDUCTION INTERNAL FIXATION (ORIF) LEFT DISTAL RADIUS FRACTURE.;  Surgeon: Christena Flake, MD;  Location: [facility] ORS;  Service: Orthopedics;  Laterality: Left;   Social History:  reports that she quit smoking about 2 years ago. Her smoking use included cigarettes. She started smoking about 32 years ago. She has a 30 pack-year smoking history. She has never used smokeless tobacco. She reports that she does not currently use alcohol after a past usage of about 1.0 standard drink of alcohol per week. She reports that she does not use drugs.  Allergies  Allergen Reactions   Factive [Gemifloxacin] Rash    Family History  Problem Relation Age of Onset   Diabetes Mother        alive @ 60   Goiter Mother    Heart disease Father        died of MI @ 2   Osteoporosis Maternal Grandmother    Colon cancer Maternal Grandfather    Breast cancer Neg Hx    Ovarian cancer Neg Hx     Prior to Admission medications   Medication Sig Start Date End Date Taking? Authorizing Provider  acetaminophen (  TYLENOL) 500 MG tablet Take 500 mg by mouth every 6 (six) hours as needed.   Yes [provider]  ALPRAZolam (XANAX) 0.5 MG tablet TAKE 1 TABLET BY MOUTH DAILY AS NEEDED FOR ANXIETY. 06/01/23  Yes Creig Hines, MD  citalopram (CELEXA) 10 MG tablet Take 1 tablet (10 mg total) by mouth daily. Patient taking differently: Take 10 mg by mouth at bedtime. 02/17/23  Yes Creig Hines, MD  HYDROcodone bit-homatropine (HYCODAN) 5-1.5 MG/5ML syrup Take 5 mLs by mouth every 6 (six) hours as needed for cough. 05/24/23  Yes Creig Hines, MD  hydrocortisone (CORTEF) 10 MG tablet TAKE 1 TABLET BY MOUTH NIGHTLY 01/25/23  Yes Creig Hines, MD  hydrocortisone (CORTEF) 20 MG tablet TAKE 1 TABLET BY MOUTH EVERY MORNING 05/09/23  Yes Creig Hines, MD   HYDROmorphone (DILAUDID) 2 MG tablet Take 1-2 tablets (2-4 mg total) by mouth every 4 (four) hours as needed for severe pain. 06/20/23  Yes Borders, Daryl Eastern, NP  lidocaine (LIDODERM) 5 % Place 1 patch onto the skin daily. Remove & Discard patch within 12 hours or as directed by MD   Yes [provider]  losartan (COZAAR) 50 MG tablet Take 50 mg by mouth daily.   Yes [provider]  OLANZapine (ZYPREXA) 5 MG tablet Take 1 tablet (5 mg total) by mouth at bedtime. 04/25/23  Yes Borders, Daryl Eastern, NP  ondansetron (ZOFRAN) 4 MG tablet TAKE 1 TABLET BY MOUTH EVERY 8 HOURS AS NEEDED FOR NAUSEA AND VOMITING Patient taking differently: Take 4 mg by mouth every 8 (eight) hours as needed for nausea or vomiting. TAKE 1 TABLET BY MOUTH EVERY 8 HOURS AS NEEDED FOR NAUSEA AND VOMITING 12/29/22  Yes Covington, Sarah M, PA-C  Vedolizumab (ENTYVIO) 108 MG/0.68ML SOPN Inject 1 Pen into the skin every 14 (fourteen) days. Patient taking differently: Inject 1 Pen into the skin every 30 (thirty) days. 05/26/23  Yes Vanga, Loel Dubonnet, MD  apixaban (ELIQUIS) 2.5 MG TABS tablet Take 1 tablet (2.5 mg total) by mouth 2 (two) times daily for 14 days. 07/01/23 07/15/23  Evon Slack, PA-C  fentaNYL (DURAGESIC) 25 MCG/HR Place 1 patch onto the skin every 3 (three) days. 06/20/23   Borders, Daryl Eastern, NP  prochlorperazine (COMPAZINE) 10 MG tablet Take 1 tablet (10 mg total) by mouth every 6 (six) hours as needed for nausea or vomiting. 12/17/22   Creig Hines, MD    Physical Exam: Vitals:   07/01/23 1635 07/01/23 1758 07/01/23 2203 07/02/23 0610  BP: 99/81 118/87 121/88 125/84  Pulse: (!) 105 (!) 108 (!) 106 (!) 130  Resp: 18 14 16 20   Temp:  98.3 F (36.8 C) 98.5 F (36.9 C) 99.7 F (37.6 C)  TempSrc:      SpO2: 95% 97% 98% 93%  Weight:      Height:       Eyes: PERRL, lids and conjunctivae normal ENMT: Mucous membranes are moist. Posterior pharynx clear of any exudate or lesions.Normal dentition.   Neck: normal, supple, no masses, no thyromegaly Respiratory: clear to auscultation bilaterally, no wheezing, no crackles. Normal respiratory effort. No accessory muscle use.  Cardiovascular: Regular rate and rhythm, no murmurs / rubs / gallops. No extremity edema. 2+ pedal pulses. No carotid bruits.  Abdomen: no tenderness, no masses palpated. No hepatosplenomegaly. Bowel sounds positive.  Musculoskeletal: no clubbing / cyanosis. No joint deformity upper and lower extremities. Good ROM, no contractures. Normal muscle tone.  Skin:  no rashes, lesions, ulcers. No induration Neurologic: CN 2-12 grossly intact. Sensation intact, DTR normal.  Muscle strength 5/5 on both sides Psychiatric: Normal judgment and insight. Alert and oriented x 3. Normal mood.    Data Reviewed:    reviewed her reviewed her most recent office visit record from October 1 and CT study in May 2024  Family Communication: None at bedside Primary team communication: Ortho team Thank you very much for involving [place] in the care of your patient.  Author: Emeline General, MD 07/02/2023 8:31 AM  For on call review [email or web address].

## 2023-07-02 NOTE — Progress Notes (Signed)
   07/02/23 1110  Charting Type  Charting Type Full Reassessment Changes Noted  Neurological  Neuro (WDL) WDL  Respiratory  Respiratory (WDL) WDL  Respiratory Pattern Regular;Unlabored;Symmetrical  Chest Assessment Chest expansion symmetrical  Cardiac  Cardiac (WDL) X  Pulse Regular  Heart Sounds S1, S2  Jugular Venous Distention (JVD) Yes  ECG Monitor Yes  ECG Monitoring  Cardiac Rhythm ST  Gastrointestinal  Gastrointestinal (WDL) X  Abdomen Inspection Soft  Bowel Sounds Assessment Hypoactive     Primary RN at bedside to evaluate patient. Hospitalist informed.

## 2023-07-02 NOTE — Progress Notes (Signed)
ORTHOPAEDICS: Contacted by Nursing at about 6:30am about heart rate of 130. Bllod pressure was stable and pain was well controlled. Patient was reported to be alert but lethargic by nursing.  EKG ordered with findings of sinus tachycardia. Given the patient's history of unstable angina and metastatic breast carcinoma, Medicine consult was requested. I was eventually able to speak to the hospitalist who will evaluate the patient.  Vergene Marland P. Angie Fava M.D.

## 2023-07-02 NOTE — TOC Progression Note (Signed)
Transition of Care [facility]) - Progression Note    Patient Details  Name: Gina Sanchez MRN: [id number] Date of Birth: 05-25-1966  Transition of [facility]) CM/SW Contact  Liliana Cline, LCSW Phone Number: 07/02/2023, 3:49 PM  Clinical Narrative:    Ada with [employer] states they are not in network with patient's insurance, patient could private pay $75 if desired. Called Jermaine with [employer], he states they are unable to provide the 3in1. Called patient's husband, requested information so I can look up in network DME providers. He states a family member has a 3in1 that patient can borrow so they no longer want it to be ordered.  Expected Discharge Plan: Home w Home Health Services Barriers to Discharge: Continued Medical Work up  Expected Discharge Plan and Services In-house Referral: NA Discharge Planning Services: CM Consult   Living arrangements for the past 2 months: Single Family Home Expected Discharge Date: 07/01/23               DME Arranged: 3-N-1         HH Arranged: PT, OT HH Agency: [facility] Date HH Agency Contacted: 07/02/23   Representative spoke with at [facility] Agency: [place]   Social Determinants of Health (SDOH) Interventions SDOH Screenings   Food Insecurity: No Food Insecurity (06/30/2023)  Housing: Low Risk  (06/30/2023)  Transportation Needs: No Transportation Needs (06/30/2023)  Utilities: Not At Risk (06/30/2023)  Tobacco Use: Medium Risk (06/30/2023)    Readmission Risk Interventions    07/02/2023   12:51 PM  Readmission Risk Prevention Plan  Transportation Screening Complete  PCP or Specialist Appt within 5-7 Days Complete  Home Care Screening Complete  Medication Review (RN CM) Complete

## 2023-07-03 DIAGNOSIS — R000 Tachycardia, unspecified: Secondary | ICD-10-CM

## 2023-07-03 DIAGNOSIS — M84553A Pathological fracture in neoplastic disease, unspecified femur, initial encounter for fracture: Secondary | ICD-10-CM | POA: Diagnosis not present

## 2023-07-03 NOTE — Plan of Care (Signed)
  Problem: Education: Goal: Knowledge of General Education information will improve Description Including pain rating scale, medication(s)/side effects and non-pharmacologic comfort measures Outcome: Progressing   

## 2023-07-03 NOTE — Progress Notes (Signed)
Physical Therapy Treatment Patient Details Name: Gina Sanchez MRN: [id number] DOB: [birth year] Today's Date: 07/03/2023   History of Present Illness Patient is a 57 y.o. female who presents for evaluation and treatment of her left thigh pain. The patient notes that the symptoms developed about 6 to 8 weeks ago and developed without any specific cause or injury. Current MD assessment: Left prophylactic intramedullary nailing of impending left subtrochanteric femur fracture.    PT Comments  Pt I bed, states she feels better.  She is given time but is able to get to EOB on her own with rail assist.  Steady in sitting.  Stands with min a x 1 due to slight unsteadiness when upright and is able to walk 30', 60', 30' x 2 with RW and slow steady gait.  Cues for walker position but overall increased gait and tolerance today.  She is able to up/down 3 steps with B handrails with CGA x 1 and encouragement.    Discussed discharge plan at length.  She does not want to go to SNF and feels she is able to go home.  She demonstrated improved gait distances today and ability to do stairs.  She does have a gait belt in room and is encouraged to use it and have husband with her at all times for safety.  Voices understanding.  Stated she does have a walker but is unsure of BSC but will have husband check. She stated she feels she can manage at home with husband assist and would like to go home today if possible.  Will reach out to team.     If plan is discharge home, recommend the following: Assist for transportation;Help with stairs or ramp for entrance;A little help with walking and/or transfers;A little help with bathing/dressing/bathroom;Assistance with cooking/housework   Can travel by private vehicle        Equipment Recommendations  BSC/3in1    Recommendations for Other Services       Precautions / Restrictions Precautions Precautions: Fall Restrictions Weight Bearing Restrictions: Yes LLE Weight  Bearing: Weight bearing as tolerated     Mobility  Bed Mobility Overal bed mobility: Needs Assistance Bed Mobility: Supine to Sit     Supine to sit: Contact guard, Used rails     General bed mobility comments: increased time but is able to do without assist Patient Response: Cooperative  Transfers Overall transfer level: Needs assistance Equipment used: Rolling walker (2 wheels) Transfers: Sit to/from Stand Sit to Stand: Contact guard assist           General transfer comment: Min cues for hand placement.    Ambulation/Gait Ambulation/Gait assistance: Contact guard assist Gait Distance (Feet): 60 Feet Assistive device: Rolling walker (2 wheels) Gait Pattern/deviations: Step-to pattern, Decreased stance time - left, Decreased step length - right, Antalgic Gait velocity: min cues for walker positioning     General Gait Details: 20, 60, 30'x 2   Stairs Stairs: Yes Stairs assistance: Contact guard assist, Min assist Stair Management: Two rails, Forwards, Backwards, Step to pattern Number of Stairs: 4 General stair comments: 1 step up forward and down backwards x 3 - increased time but no phsyical asisst needed outside of guarding   Wheelchair Mobility     Tilt Bed Tilt Bed Patient Response: Cooperative  Modified Rankin (Stroke Patients Only)       Balance Overall balance assessment: Needs assistance Sitting-balance support: Single extremity supported, Feet supported Sitting balance-Leahy Scale: Good     Standing balance support:  During functional activity, Reliant on assistive device for balance, No upper extremity supported Standing balance-Leahy Scale: Fair                              Cognition Arousal: Alert Behavior During Therapy: WFL for tasks assessed/performed, Flat affect Overall Cognitive Status: Within Functional Limits for tasks assessed                                 General Comments: More alert pm session.   Less time needed to respond to questions and able to follow simple commands better.        Exercises      General Comments        Pertinent Vitals/Pain Pain Assessment Pain Assessment: Faces Faces Pain Scale: Hurts little more Pain Location: L hip Pain Descriptors / Indicators: Aching, Sore Pain Intervention(s): Limited activity within patient's tolerance, Monitored during session, Repositioned    Home Living                          Prior Function            PT Goals (current goals can now be found in the care plan section) Progress towards PT goals: Progressing toward goals    Frequency    BID      PT Plan      Co-evaluation              AM-PAC PT "6 Clicks" Mobility   Outcome Measure  Help needed turning from your back to your side while in a flat bed without using bedrails?: None Help needed moving from lying on your back to sitting on the side of a flat bed without using bedrails?: A Little Help needed moving to and from a bed to a chair (including a wheelchair)?: A Little Help needed standing up from a chair using your arms (e.g., wheelchair or bedside chair)?: A Little Help needed to walk in hospital room?: A Little Help needed climbing 3-5 steps with a railing? : A Little 6 Click Score: 19    End of Session Equipment Utilized During Treatment: Gait belt Activity Tolerance: Patient limited by lethargy;Patient limited by pain Patient left: in chair;with chair alarm set;with call bell/phone within reach Nurse Communication: Mobility status;Weight bearing status PT Visit Diagnosis: Other abnormalities of gait and mobility (R26.89);Difficulty in walking, not elsewhere classified (R26.2);Unsteadiness on feet (R26.81);Pain;Muscle weakness (generalized) (M62.81) Pain - Right/Left: Left Pain - part of body: Leg     Time: 0812-0854 PT Time Calculation (min) (ACUTE ONLY): 42 min  Charges:    $Gait Training: 38-52 mins PT General  Charges $$ ACUTE PT VISIT: 1 Visit                   Danielle Dess, PTA 07/03/23, 9:06 AM

## 2023-07-03 NOTE — Progress Notes (Signed)
   07/03/23 1000  Spiritual Encounters  Type of Visit Initial  Care provided to: Patient  Reason for visit Routine spiritual support  OnCall Visit Yes   Chaplain spontaneously engaged with patient as he was walking by to provide support and encouragement.

## 2023-07-03 NOTE — TOC Transition Note (Signed)
Transition of Care [facility]) - CM/SW Discharge Note   Patient Details  Name: AZAILYA MCGANNON MRN: [id number] Date of Birth: [birth year]  [facility] of [facility]) CM/SW Contact:  Liliana Cline, LCSW Phone Number: 07/03/2023, 11:21 AM   Clinical Narrative:    CSW notified [place] with [facility] that patient is discharging home today.   Final next level of care: Home w Home Health Services Barriers to Discharge: Barriers Resolved   Patient Goals and CMS Choice CMS Medicare.gov Compare Post Acute Care list provided to:: Patient Choice offered to / list presented to : Patient  Discharge Placement                         Discharge Plan and Services Additional resources added to the After Visit Summary for   In-house Referral: NA Discharge Planning Services: CM Consult            DME Arranged: 3-N-1         HH Arranged: PT, OT HH Agency: [facility] Date [facility] Agency Contacted: 07/03/23   Representative spoke with at [facility] Agency: Laurelyn Sickle  Social Determinants of Health (SDOH) Interventions SDOH Screenings   Food Insecurity: No Food Insecurity (06/30/2023)  Housing: Low Risk  (06/30/2023)  Transportation Needs: No Transportation Needs (06/30/2023)  Utilities: Not At Risk (06/30/2023)  Tobacco Use: Medium Risk (06/30/2023)     Readmission Risk Interventions    07/02/2023   12:51 PM  Readmission Risk Prevention Plan  Transportation Screening Complete  PCP or Specialist Appt within 5-7 Days Complete  Home Care Screening Complete  Medication Review (RN CM) Complete

## 2023-07-03 NOTE — Progress Notes (Signed)
PROGRESS NOTE    Gina Sanchez  VHQ:[id number] DOB: 01-Mar-1966 DOA: 06/30/2023 PCP: Armando Gang, FNP   Assessment & Plan:   Principal Problem:   Pathologic fracture of part of femur (HCC)  Assessment and Plan:  Sinus tachycardia: acute on chronic. Continue on metoprolol. D/c losartan  Pathologic fracture of femur: s/p repair as per ortho surg. 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 Continue one eliquis for DVT prophylaxis  Lung cancer: stage IV, w/ mets. Continue on fentanyl patch, lidocaine patch  Depression: severity unknown. Continue on home dose of citalopram  Possible acquired adrenal insufficiency: continue on home dose of hydrocortisone    DVT prophylaxis: eliquis  Code Status: full Family Communication:  Disposition Plan: as per primary attending   Level of care: Med-Surg Status is: Inpatient Remains inpatient appropriate because: management as per primary attending     Consultants:  Hospitalist   Procedures:   Antimicrobials:   Subjective: Pt c/o fatigue   Objective: Vitals:   07/02/23 1631 07/02/23 2039 07/03/23 0424 07/03/23 0746  BP: 105/81 105/78 108/77 116/84  Pulse: 95 98 (!) 101 (!) 101  Resp: 16 18 18 16   Temp: (!) 97.4 F (36.3 C) 97.7 F (36.5 C) 98.7 F (37.1 C) 97.7 F (36.5 C)  TempSrc: Oral Oral  Oral  SpO2: 96% 100% 95% 99%  Weight:      Height:        Intake/Output Summary (Last 24 hours) at 07/03/2023 0802 Last data filed at 07/03/2023 0430 Gross per 24 hour  Intake 240 ml  Output 725 ml  Net -485 ml   Filed Weights   06/30/23 1132  Weight: 76.2 kg    Examination:  General exam: Appears calm and comfortable  Respiratory system: decreased breath sounds b/l. Cardiovascular system: S1 & S2 +. No  rubs, gallops or clicks. Gastrointestinal system: Abdomen is nondistended, soft and nontender. Normal bowel sounds heard. Central nervous system: Alert and oriented. Moves all extremities  Psychiatry:  Judgement and insight appear normal.      Data Reviewed: I have personally reviewed following labs and imaging studies  CBC: Recent Labs  Lab 07/02/23 0903  WBC 10.4  HGB 9.9*  HCT 30.4*  MCV 86.1  PLT 217   Basic Metabolic Panel: No results for input(s): "NA", "K", "CL", "CO2", "GLUCOSE", "BUN", "CREATININE", "CALCIUM", "MG", "PHOS" in the last 168 hours. GFR: Estimated Creatinine Clearance: 81.9 mL/min (by C-G formula based on SCr of 0.69 mg/dL). Liver Function Tests: No results for input(s): "AST", "ALT", "ALKPHOS", "BILITOT", "PROT", "ALBUMIN" in the last 168 hours. No results for input(s): "LIPASE", "AMYLASE" in the last 168 hours. No results for input(s): "AMMONIA" in the last 168 hours. Coagulation Profile: No results for input(s): "INR", "PROTIME" in the last 168 hours. Cardiac Enzymes: No results for input(s): "CKTOTAL", "CKMB", "CKMBINDEX", "TROPONINI" in the last 168 hours. BNP (last 3 results) No results for input(s): "PROBNP" in the last 8760 hours. HbA1C: No results for input(s): "HGBA1C" in the last 72 hours. CBG: No results for input(s): "GLUCAP" in the last 168 hours. Lipid Profile: No results for input(s): "CHOL", "HDL", "LDLCALC", "TRIG", "CHOLHDL", "LDLDIRECT" in the last 72 hours. Thyroid Function Tests: No results for input(s): "TSH", "T4TOTAL", "FREET4", "T3FREE", "THYROIDAB" in the last 72 hours. Anemia Panel: No results for input(s): "VITAMINB12", "FOLATE", "FERRITIN", "TIBC", "IRON", "RETICCTPCT" in the last 72 hours. Sepsis Labs: Recent Labs  Lab 07/02/23 0903  LATICACIDVEN 1.6    No results found for this or any previous visit (  from the past 240 hour(s)).       Radiology Studies: DG Chest 1 View  Result Date: 07/02/2023 CLINICAL DATA:  Tachycardia EXAM: CHEST  1 VIEW COMPARISON:  None Available. FINDINGS: Abnormal mediastinal silhouette, compatible with known adenopathy. New somewhat nodular opacity in the right midlung. No visible  pleural effusions or pneumothorax. Polyarticular degenerative change. IMPRESSION: 1. New somewhat nodular opacity in the right midlung which could represent progressive metastatic disease, treatment change, and/or infection. CT of the chest with contrast could further evaluate if clinically warranted 2. Abnormal mediastinal silhouette, compatible with known lymphadenopathy. Electronically Signed   By: Feliberto Harts M.D.   On: 07/02/2023 13:02        Scheduled Meds:  apixaban  2.5 mg Oral BID   citalopram  10 mg Oral QHS   docusate sodium  100 mg Oral BID   fentaNYL  1 patch Transdermal Q72H   hydrocortisone  20 mg Oral QHS   hydrocortisone  40 mg Oral q morning   lidocaine  1 patch Transdermal Q24H   metoprolol succinate  25 mg Oral Daily   OLANZapine  5 mg Oral QHS   Continuous Infusions:   LOS: 2 days     Charise Killian, MD [facility] Pager [phone]  If 7PM-7AM, please contact night-coverage [email or web address] 07/03/2023, 8:02 AM

## 2023-07-03 NOTE — Progress Notes (Signed)
   Subjective: 3 Days Post-Op Procedure(s) (LRB): Left prophylactic intramedullary nailing of impending left subtrochanteric femur fracture (Left) Patient reports pain as mild.   Patient tolerated physical therapy well today.  Much improved from yesterday.  Heart rate improved.  Patient denies any palpitations, shortness of breath, chest pain, diaphoresis, nausea, vomiting, abdominal pain. We will continue therapy today.  Plan is to go Home after hospital stay.  Objective: Vital signs in last 24 hours: Temp:  [97.4 F (36.3 C)-100.6 F (38.1 C)] 97.7 F (36.5 C) (10/20 0746) Pulse Rate:  [95-116] 101 (10/20 0746) Resp:  [16-18] 16 (10/20 0746) BP: (94-116)/(70-84) 116/84 (10/20 0746) SpO2:  [90 %-100 %] 99 % (10/20 0746)  Intake/Output from previous day: 10/19 0701 - 10/20 0700 In: 240 [P.O.:240] Out: 725 [Urine:725] Intake/Output this shift: No intake/output data recorded.  Recent Labs    07/02/23 0903  HGB 9.9*   Recent Labs    07/02/23 0903  WBC 10.4  RBC 3.53*  HCT 30.4*  PLT 217   No results for input(s): "NA", "K", "CL", "CO2", "BUN", "CREATININE", "GLUCOSE", "CALCIUM" in the last 72 hours. No results for input(s): "LABPT", "INR" in the last 72 hours.  EXAM General - Patient is Alert, Appropriate, and Oriented Extremity - Neurovascular intact Sensation intact distally Intact pulses distally Dorsiflexion/Plantar flexion intact Dressing - dressing C/D/I and no drainage Motor Function - intact, moving foot and toes well on exam.   Past Medical History:  Diagnosis Date   Abnormal Pap smear of cervix    Anxiety    C. 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   Chronic diarrhea    Closed fracture of distal end of left radius 03/23/2021   Colon polyp    Depression    Diverticulosis    Essential hypertension    Femur fracture, right (HCC) 09/14/2020   GERD (gastroesophageal reflux disease)    Hyperlipidemia    Leukocytosis     Lumbar radiculopathy    Lung cancer (HCC)    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   Metastatic cancer (HCC) 10/2020   lung, bone, lymph node, adrenal   Palpitations    Precordial chest pain    Wrist fracture 03/2021   left    Assessment/Plan:   3 Days Post-Op Procedure(s) (LRB): Left prophylactic intramedullary nailing of impending left subtrochanteric femur fracture (Left) Principal Problem:   Pathologic fracture of part of femur (HCC)  Estimated body mass index is 27.12 kg/m as calculated from the following:   Height as of this encounter: 5\' 6"  (1.676 m).   Weight as of this encounter: 76.2 kg. Advance diet Up with therapy Pain well controlled Vital signs are stable.  Heart rate at baseline. Asymptomatic. Patient appears much more alert today.  She was able to tolerate physical therapy well.  Patient stable and ready for discharge to home with home health PT  DVT Prophylaxis - TED hose and Eliquis Weight-Bearing as tolerated to left leg   T. Cranston Neighbor, PA-C [facility] 07/03/2023, 9:33 AM

## 2023-07-04 ENCOUNTER — Other Ambulatory Visit: Payer: Self-pay

## 2023-07-04 ENCOUNTER — Inpatient Hospital Stay
Admission: EM | Admit: 2023-07-04 | Discharge: 2023-07-09 | DRG: 871 | Disposition: A | Discharge status: Home Health | Payer: 59 | Attending: Hospitalist | Admitting: Hospitalist

## 2023-07-04 DIAGNOSIS — F32A Depression, unspecified: Secondary | ICD-10-CM | POA: Diagnosis present

## 2023-07-04 DIAGNOSIS — C801 Malignant (primary) neoplasm, unspecified: Secondary | ICD-10-CM

## 2023-07-04 DIAGNOSIS — Z85118 Personal history of other malignant neoplasm of bronchus and lung: Secondary | ICD-10-CM | POA: Diagnosis not present

## 2023-07-04 DIAGNOSIS — I959 Hypotension, unspecified: Secondary | ICD-10-CM | POA: Diagnosis not present

## 2023-07-04 DIAGNOSIS — R457 State of emotional shock and stress, unspecified: Secondary | ICD-10-CM | POA: Diagnosis not present

## 2023-07-04 DIAGNOSIS — C7951 Secondary malignant neoplasm of bone: Secondary | ICD-10-CM | POA: Diagnosis present

## 2023-07-04 DIAGNOSIS — E2740 Unspecified adrenocortical insufficiency: Secondary | ICD-10-CM | POA: Diagnosis present

## 2023-07-04 DIAGNOSIS — Z1152 Encounter for screening for COVID-19: Secondary | ICD-10-CM

## 2023-07-04 DIAGNOSIS — C7931 Secondary malignant neoplasm of brain: Secondary | ICD-10-CM | POA: Diagnosis present

## 2023-07-04 DIAGNOSIS — S7222XA Displaced subtrochanteric fracture of left femur, initial encounter for closed fracture: Secondary | ICD-10-CM | POA: Diagnosis not present

## 2023-07-04 DIAGNOSIS — A419 Sepsis, unspecified organism: Secondary | ICD-10-CM | POA: Diagnosis not present

## 2023-07-04 DIAGNOSIS — G4089 Other seizures: Secondary | ICD-10-CM | POA: Diagnosis present

## 2023-07-04 DIAGNOSIS — Z79891 Long term (current) use of opiate analgesic: Secondary | ICD-10-CM

## 2023-07-04 DIAGNOSIS — Z8262 Family history of osteoporosis: Secondary | ICD-10-CM

## 2023-07-04 DIAGNOSIS — R918 Other nonspecific abnormal finding of lung field: Secondary | ICD-10-CM | POA: Diagnosis not present

## 2023-07-04 DIAGNOSIS — E785 Hyperlipidemia, unspecified: Secondary | ICD-10-CM | POA: Diagnosis present

## 2023-07-04 DIAGNOSIS — Z8249 Family history of ischemic heart disease and other diseases of the circulatory system: Secondary | ICD-10-CM

## 2023-07-04 DIAGNOSIS — F419 Anxiety disorder, unspecified: Secondary | ICD-10-CM | POA: Diagnosis present

## 2023-07-04 DIAGNOSIS — R000 Tachycardia, unspecified: Secondary | ICD-10-CM | POA: Diagnosis not present

## 2023-07-04 DIAGNOSIS — J188 Other pneumonia, unspecified organism: Secondary | ICD-10-CM

## 2023-07-04 DIAGNOSIS — Z79899 Other long term (current) drug therapy: Secondary | ICD-10-CM

## 2023-07-04 DIAGNOSIS — Z800 Family history of malignant neoplasm of digestive organs: Secondary | ICD-10-CM

## 2023-07-04 DIAGNOSIS — R569 Unspecified convulsions: Secondary | ICD-10-CM

## 2023-07-04 DIAGNOSIS — K449 Diaphragmatic hernia without obstruction or gangrene: Secondary | ICD-10-CM | POA: Diagnosis not present

## 2023-07-04 DIAGNOSIS — R4182 Altered mental status, unspecified: Principal | ICD-10-CM

## 2023-07-04 DIAGNOSIS — Z833 Family history of diabetes mellitus: Secondary | ICD-10-CM

## 2023-07-04 DIAGNOSIS — C779 Secondary and unspecified malignant neoplasm of lymph node, unspecified: Secondary | ICD-10-CM | POA: Diagnosis present

## 2023-07-04 DIAGNOSIS — Z7901 Long term (current) use of anticoagulants: Secondary | ICD-10-CM

## 2023-07-04 DIAGNOSIS — C7972 Secondary malignant neoplasm of left adrenal gland: Secondary | ICD-10-CM | POA: Diagnosis present

## 2023-07-04 DIAGNOSIS — C3491 Malignant neoplasm of unspecified part of right bronchus or lung: Secondary | ICD-10-CM | POA: Diagnosis present

## 2023-07-04 DIAGNOSIS — Z87891 Personal history of nicotine dependence: Secondary | ICD-10-CM

## 2023-07-04 DIAGNOSIS — C3490 Malignant neoplasm of unspecified part of unspecified bronchus or lung: Secondary | ICD-10-CM

## 2023-07-04 DIAGNOSIS — C7971 Secondary malignant neoplasm of right adrenal gland: Secondary | ICD-10-CM | POA: Diagnosis present

## 2023-07-04 DIAGNOSIS — K219 Gastro-esophageal reflux disease without esophagitis: Secondary | ICD-10-CM | POA: Diagnosis present

## 2023-07-04 DIAGNOSIS — I420 Dilated cardiomyopathy: Secondary | ICD-10-CM | POA: Diagnosis present

## 2023-07-04 DIAGNOSIS — J189 Pneumonia, unspecified organism: Secondary | ICD-10-CM | POA: Diagnosis present

## 2023-07-04 DIAGNOSIS — R0902 Hypoxemia: Secondary | ICD-10-CM | POA: Diagnosis not present

## 2023-07-04 DIAGNOSIS — I10 Essential (primary) hypertension: Secondary | ICD-10-CM | POA: Diagnosis present

## 2023-07-04 DIAGNOSIS — E872 Acidosis, unspecified: Secondary | ICD-10-CM | POA: Diagnosis present

## 2023-07-04 DIAGNOSIS — R6520 Severe sepsis without septic shock: Secondary | ICD-10-CM | POA: Diagnosis present

## 2023-07-04 DIAGNOSIS — D649 Anemia, unspecified: Secondary | ICD-10-CM | POA: Diagnosis present

## 2023-07-04 DIAGNOSIS — R0689 Other abnormalities of breathing: Secondary | ICD-10-CM | POA: Diagnosis not present

## 2023-07-04 DIAGNOSIS — Z515 Encounter for palliative care: Secondary | ICD-10-CM

## 2023-07-04 LAB — CBC WITH DIFFERENTIAL/PLATELET
Abs Immature Granulocytes: 0.11 10*3/uL — ABNORMAL HIGH (ref 0.00–0.07)
Basophils Absolute: 0.1 10*3/uL (ref 0.0–0.1)
Basophils Relative: 0 %
Eosinophils Absolute: 0.2 10*3/uL (ref 0.0–0.5)
Eosinophils Relative: 2 %
HCT: 31.6 % — ABNORMAL LOW (ref 36.0–46.0)
Hemoglobin: 10.2 g/dL — ABNORMAL LOW (ref 12.0–15.0)
Immature Granulocytes: 1 %
Lymphocytes Relative: 22 %
Lymphs Abs: 2.5 10*3/uL (ref 0.7–4.0)
MCH: 27.8 pg (ref 26.0–34.0)
MCHC: 32.3 g/dL (ref 30.0–36.0)
MCV: 86.1 fL (ref 80.0–100.0)
Monocytes Absolute: 1.2 10*3/uL — ABNORMAL HIGH (ref 0.1–1.0)
Monocytes Relative: 10 %
Neutro Abs: 7.5 10*3/uL (ref 1.7–7.7)
Neutrophils Relative %: 65 %
Platelets: 278 10*3/uL (ref 150–400)
RBC: 3.67 MIL/uL — ABNORMAL LOW (ref 3.87–5.11)
RDW: 15.9 % — ABNORMAL HIGH (ref 11.5–15.5)
WBC: 11.6 10*3/uL — ABNORMAL HIGH (ref 4.0–10.5)
nRBC: 0.2 % (ref 0.0–0.2)

## 2023-07-04 LAB — COMPREHENSIVE METABOLIC PANEL
ALT: 12 U/L (ref 0–44)
AST: 20 U/L (ref 15–41)
Albumin: 2.8 g/dL — ABNORMAL LOW (ref 3.5–5.0)
Alkaline Phosphatase: 116 U/L (ref 38–126)
Anion gap: 13 (ref 5–15)
BUN: 13 mg/dL (ref 6–20)
CO2: 20 mmol/L — ABNORMAL LOW (ref 22–32)
Calcium: 8.6 mg/dL — ABNORMAL LOW (ref 8.9–10.3)
Chloride: 97 mmol/L — ABNORMAL LOW (ref 98–111)
Creatinine, Ser: 0.56 mg/dL (ref 0.44–1.00)
GFR, Estimated: >60 mL/min (ref >60)
Glucose, Bld: 96 mg/dL (ref 70–99)
Potassium: 3.6 mmol/L (ref 3.5–5.1)
Sodium: 130 mmol/L — ABNORMAL LOW (ref 135–145)
Total Bilirubin: 1 mg/dL (ref 0.3–1.2)
Total Protein: 7 g/dL (ref 6.5–8.1)

## 2023-07-04 LAB — SURGICAL PATHOLOGY

## 2023-07-04 LAB — LACTIC ACID, PLASMA: Lactic Acid, Venous: 2.9 mmol/L (ref 0.5–1.9)

## 2023-07-04 MED ORDER — METRONIDAZOLE 500 MG/100ML IV SOLN
500.0000 mg | Freq: Once | INTRAVENOUS | Status: AC
  Administered 2023-07-05: 500 mg via INTRAVENOUS
  Filled 2023-07-04: qty 100

## 2023-07-04 MED ORDER — LACTATED RINGERS IV BOLUS (SEPSIS)
1000.0000 mL | Freq: Once | INTRAVENOUS | Status: AC
  Administered 2023-07-05: 1000 mL via INTRAVENOUS

## 2023-07-04 MED ORDER — LACTATED RINGERS IV BOLUS (SEPSIS)
500.0000 mL | Freq: Once | INTRAVENOUS | Status: AC
  Administered 2023-07-05: 500 mL via INTRAVENOUS

## 2023-07-04 MED ORDER — SODIUM CHLORIDE 0.9 % IV SOLN
2.0000 g | Freq: Once | INTRAVENOUS | Status: AC
  Administered 2023-07-04: 2 g via INTRAVENOUS
  Filled 2023-07-04: qty 12.5

## 2023-07-04 MED ORDER — VANCOMYCIN HCL IN DEXTROSE 1-5 GM/200ML-% IV SOLN
1000.0000 mg | Freq: Once | INTRAVENOUS | Status: DC
  Filled 2023-07-04: qty 200

## 2023-07-04 MED ORDER — LACTATED RINGERS IV BOLUS (SEPSIS)
1000.0000 mL | Freq: Once | INTRAVENOUS | Status: AC
  Administered 2023-07-04: 1000 mL via INTRAVENOUS

## 2023-07-04 MED ORDER — VANCOMYCIN HCL IN DEXTROSE 1-5 GM/200ML-% IV SOLN
1000.0000 mg | Freq: Once | INTRAVENOUS | Status: AC
  Administered 2023-07-05: 1000 mg via INTRAVENOUS
  Filled 2023-07-04: qty 200

## 2023-07-04 MED ORDER — ACETAMINOPHEN 500 MG PO TABS
1000.0000 mg | ORAL_TABLET | Freq: Once | ORAL | Status: AC
  Administered 2023-07-05: 1000 mg via ORAL
  Filled 2023-07-04: qty 2

## 2023-07-04 MED ORDER — VANCOMYCIN HCL 2000 MG/400ML IV SOLN
2000.0000 mg | Freq: Once | INTRAVENOUS | Status: DC
  Filled 2023-07-04: qty 400

## 2023-07-04 NOTE — ED Provider Notes (Signed)
[facility] Provider Note    Event Date/Time   First MD Initiated Contact with Patient 07/04/23 2302     (approximate)   History   Altered Mental Status   HPI  Gina Sanchez is a 57 y.o. female history of metastatic lung cancer not currently on chemotherapy, recent intramedullary nail to the left femur by Dr. Joice Lofts on 06/30/2023 who presents to the emergency department with altered mental status.  Last seen normal by husband last night.  She is febrile here.  He is not aware of any fever but states she has had cough.  No vomiting or diarrhea.  She denies any pain.   History provided by patient, husband.    Past Medical History:  Diagnosis Date   Abnormal Pap smear of cervix    Anxiety    C. 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   Metastatic cancer (HCC) 10/2020   lung, bone, lymph node, adrenal   Palpitations    Precordial chest pain    Wrist fracture 03/2021   left    Past Surgical History:  Procedure Laterality Date   BREAST BIOPSY Right 2017   benign   CERVICAL BIOPSY  W/ LOOP ELECTRODE EXCISION     COLONOSCOPY  06/11/2022   Procedure: COLONOSCOPY;  Surgeon: Toney Reil, MD;  Location: [facility] ENDOSCOPY;  Service: Gastroenterology;;   COLONOSCOPY WITH PROPOFOL N/A 07/03/2015   Procedure: COLONOSCOPY WITH PROPOFOL;  Surgeon: Wallace Cullens, MD;  Location: [facility] ENDOSCOPY;  Service: Gastroenterology;  Laterality: N/A;   COLONOSCOPY WITH PROPOFOL N/A 01/06/2022   Procedure: COLONOSCOPY WITH PROPOFOL;  Surgeon: Toney Reil, MD;  Location: [facility]  ENDOSCOPY;  Service: Gastroenterology;  Laterality: N/A;   ESOPHAGOGASTRODUODENOSCOPY (EGD) WITH PROPOFOL N/A 07/03/2015   Procedure: ESOPHAGOGASTRODUODENOSCOPY (EGD) WITH PROPOFOL;  Surgeon: Wallace Cullens, MD;  Location: [facility] ENDOSCOPY;  Service: Gastroenterology;  Laterality: N/A;   FEMUR IM NAIL Left 06/30/2023   Procedure: Left prophylactic intramedullary nailing of impending left subtrochanteric femur fracture;  Surgeon: Christena Flake, MD;  Location: [facility] ORS;  Service: Orthopedics;  Laterality: Left;   FLEXIBLE SIGMOIDOSCOPY N/A 03/12/2022   Procedure: FLEXIBLE SIGMOIDOSCOPY;  Surgeon: Toney Reil, MD;  Location: [facility] ENDOSCOPY;  Service: Gastroenterology;  Laterality: N/A;   FLEXIBLE SIGMOIDOSCOPY N/A 08/17/2022   Procedure: FLEXIBLE SIGMOIDOSCOPY;  Surgeon: Toney Reil, MD;  Location: [facility] ENDOSCOPY;  Service: Gastroenterology;  Laterality: N/A;   INTRAMEDULLARY (IM) NAIL INTERTROCHANTERIC Right 09/15/2020   Procedure: INTRAMEDULLARY (IM) NAIL INTERTROCHANTRIC;  Surgeon: Christena Flake, MD;  Location: [facility] ORS;  Service: Orthopedics;  Laterality: Right;   IR CV LINE INJECTION  07/14/2022   IR IMAGING GUIDED PORT INSERTION  11/28/2020   LEFT HEART CATH AND CORONARY ANGIOGRAPHY N/A 03/28/2017   Procedure: Left Heart Cath and Coronary Angiography;  Surgeon: Runell Gess, MD;  Location: [facility] INVASIVE CV LAB;  Service: Cardiovascular;  Laterality: N/A;   ORIF WRIST FRACTURE Left 03/31/2021   Procedure: OPEN REDUCTION INTERNAL FIXATION (ORIF) LEFT DISTAL RADIUS FRACTURE.;  Surgeon: Christena Flake, MD;  Location: [facility] ORS;  Service: Orthopedics;  Laterality: Left;    MEDICATIONS:  Prior to Admission medications   Medication Sig Start Date End Date Taking? Authorizing Provider  acetaminophen (TYLENOL) 500 MG tablet Take 500 mg by mouth every 6 (six) hours as needed.    [provider]  ALPRAZolam (XANAX) 0.5 MG tablet TAKE 1 TABLET BY MOUTH DAILY AS NEEDED FOR ANXIETY.  06/01/23   Creig Hines, MD  apixaban (ELIQUIS) 2.5 MG TABS tablet Take 1 tablet (2.5 mg total) by mouth 2 (two) times daily for 14 days. 07/01/23 07/15/23  Evon Slack, PA-C  citalopram (CELEXA) 10 MG tablet Take 1 tablet (10 mg total) by mouth daily. Patient taking differently: Take 10 mg by mouth at bedtime. 02/17/23   Creig Hines, MD  fentaNYL (DURAGESIC) 25 MCG/HR Place 1 patch onto the skin every 3 (three) days. 06/20/23   Borders, Daryl Eastern, NP  HYDROcodone bit-homatropine (HYCODAN) 5-1.5 MG/5ML syrup Take 5 mLs by mouth every 6 (six) hours as needed for cough. 05/24/23   Creig Hines, MD  hydrocortisone (CORTEF) 10 MG tablet TAKE 1 TABLET BY MOUTH NIGHTLY 01/25/23   Creig Hines, MD  hydrocortisone (CORTEF) 20 MG tablet TAKE 1 TABLET BY MOUTH EVERY MORNING 05/09/23   Creig Hines, MD  HYDROmorphone (DILAUDID) 2 MG tablet Take 1-2 tablets (2-4 mg total) by mouth every 4 (four) hours as needed for severe pain. 06/20/23   Borders, Daryl Eastern, NP  lidocaine (LIDODERM) 5 % Place 1 patch onto the skin daily. Remove & Discard patch within 12 hours or as directed by MD    [provider]  metoprolol succinate (TOPROL-XL) 25 MG 24 hr tablet Take 1 tablet (25 mg total) by mouth daily. 07/03/23   Emeline General, MD  OLANZapine (ZYPREXA) 5 MG tablet Take 1 tablet (5 mg total) by mouth at bedtime. 04/25/23   Borders, Daryl Eastern, NP  ondansetron (ZOFRAN) 4 MG tablet TAKE 1 TABLET BY MOUTH EVERY 8 HOURS AS NEEDED FOR NAUSEA AND VOMITING Patient taking differently: Take 4 mg by mouth every 8 (eight) hours as needed for nausea or vomiting. TAKE 1 TABLET BY MOUTH EVERY 8 HOURS AS NEEDED FOR NAUSEA AND VOMITING 12/29/22   Rushie Chestnut, PA-C  prochlorperazine (COMPAZINE) 10 MG tablet Take 1 tablet (10 mg total) by mouth every 6 (six) hours as needed for nausea or vomiting. 12/17/22   Creig Hines, MD  Vedolizumab (ENTYVIO) 108 MG/0.68ML SOPN Inject 1 Pen into the skin every 14 (fourteen)  days. Patient taking differently: Inject 1 Pen into the skin every 30 (thirty) days. 05/26/23   Toney Reil, MD    Physical Exam   Triage Vital Signs: ED Triage Vitals  Encounter Vitals Group     BP 07/04/23 2251 (!) 240/183     Systolic BP Percentile --      Diastolic BP Percentile --      Pulse Rate 07/04/23 2315 (!) 145     Resp 07/04/23 2251 20     Temp 07/04/23 2255 (!) 102.2 F (39 C)     Temp Source 07/04/23 2255 Axillary     SpO2 07/04/23 2315 94 %     Weight 07/04/23 2242 179 lb 6.4 oz (81.4 kg)     Height 07/04/23 2242 5\' 6"  (1.676 m)     Head Circumference --      Peak Flow --  Pain Score --      Pain Loc --      Pain Education --      Exclude from Growth Chart --     Most recent vital signs: Vitals:   07/05/23 0400 07/05/23 0433  BP: 99/71   Pulse:    Resp: 16   Temp:  97.9 F (36.6 C)  SpO2:      CONSTITUTIONAL: Alert, responds appropriately to questions.  Oriented to person, place but not year.  Febrile.  Chronically ill-appearing. HEAD: Normocephalic, atraumatic EYES: Conjunctivae clear, pupils appear equal, sclera nonicteric ENT: normal nose; moist mucous membranes NECK: Supple, normal ROM, no meningismus CARD: Regular and tachycardic; S1 and S2 appreciated RESP: Normal chest excursion without splinting or tachypnea; breath sounds clear and equal bilaterally; no wheezes, no rhonchi, no rales, no hypoxia or respiratory distress, speaking full sentences ABD/GI: Non-distended; soft, non-tender, no rebound, no guarding, no peritoneal signs BACK: The back appears normal EXT: Normal ROM in all joints; no deformity noted, no edema, surgical incision sites to the left lower extremity are clean, dry and intact without surrounding redness, warmth, fluctuance, induration or tenderness, 2+ left DP pulse SKIN: Normal color for age and race; warm; no rash on exposed skin NEURO: Moves all extremities equally, normal speech, no facial asymmetry, unable to  reliably test sensation PSYCH: The patient's mood and manner are appropriate.   ED Results / Procedures / Treatments   LABS: (all labs ordered are listed, but only abnormal results are displayed) Labs Reviewed  LACTIC ACID, PLASMA - Abnormal; Notable for the following components:      Result Value   Lactic Acid, Venous 2.9 (*)    All other components within normal limits  PROTIME-INR - Abnormal; Notable for the following components:   Prothrombin Time 16.2 (*)    INR 1.3 (*)    All other components within normal limits  APTT - Abnormal; Notable for the following components:   aPTT 43 (*)    All other components within normal limits  URINALYSIS, W/ REFLEX TO CULTURE (INFECTION SUSPECTED) - Abnormal; Notable for the following components:   Color, Urine YELLOW (*)    APPearance CLOUDY (*)    Leukocytes,Ua SMALL (*)    All other components within normal limits  CBC WITH DIFFERENTIAL/PLATELET - Abnormal; Notable for the following components:   WBC 11.6 (*)    RBC 3.67 (*)    Hemoglobin 10.2 (*)    HCT 31.6 (*)    RDW 15.9 (*)    Monocytes Absolute 1.2 (*)    Abs Immature Granulocytes 0.11 (*)    All other components within normal limits  COMPREHENSIVE METABOLIC PANEL - Abnormal; Notable for the following components:   Sodium 130 (*)    Chloride 97 (*)    CO2 20 (*)    Calcium 8.6 (*)    Albumin 2.8 (*)    All other components within normal limits  URINE DRUG SCREEN, QUALITATIVE ([facility] ONLY) - Abnormal; Notable for the following components:   Opiate, Ur Screen POSITIVE (*)    Benzodiazepine, Ur Scrn POSITIVE (*)    All other components within normal limits  RESP PANEL BY RT-PCR (RSV, FLU A&B, COVID)  RVPGX2  CULTURE, BLOOD (ROUTINE X 2)  CULTURE, BLOOD (ROUTINE X 2)  LACTIC ACID, PLASMA  CBC WITH DIFFERENTIAL/PLATELET  CBC WITH DIFFERENTIAL/PLATELET  COMPREHENSIVE METABOLIC PANEL     EKG:  EKG Interpretation Date/Time:  Monday July 04 2023 22:52:09  EDT Ventricular Rate:  136 PR Interval:  141 QRS Duration:  58 QT Interval:  326 QTC Calculation: 491 R Axis:   76  Text Interpretation: Sinus tachycardia Ventricular premature complex Borderline repolarization abnormality Borderline prolonged QT interval Confirmed by Rochele Raring [phone]) on 07/04/2023 11:23:19 PM         RADIOLOGY: My personal review and interpretation of imaging: CT imaging concerning for right-sided pneumonia.  I have personally reviewed all radiology reports.   CT CHEST ABDOMEN PELVIS W CONTRAST  Result Date: 07/05/2023 CLINICAL DATA:  Sepsis.  History of metastatic lung cancer. EXAM: CT CHEST, ABDOMEN, AND PELVIS WITH CONTRAST TECHNIQUE: Multidetector CT imaging of the chest, abdomen and pelvis was performed following the standard protocol during bolus administration of intravenous contrast. RADIATION DOSE REDUCTION: This exam was performed according to the departmental dose-optimization program which includes automated exposure control, adjustment of the mA and/or kV according to patient size and/or use of iterative reconstruction technique. CONTRAST:  OMNIPAQUE IOHEXOL 300 MG/ML  SOLN COMPARISON:  CT chest abdomen pelvis 04/19/2023 FINDINGS: CT CHEST FINDINGS Cardiovascular: Right chest wall accessed Port-A-Cath with tip at the superior cavoatrial junction. Normal heart size. No significant pericardial effusion. The thoracic aorta is normal in caliber. No atherosclerotic plaque of the thoracic aorta. No coronary artery calcifications. Mediastinum/Nodes: Stable 1 cm subcarinal lymph node. No enlarged mediastinal, hilar, or axillary lymph nodes. Trachea and esophagus demonstrate no significant findings. Tiny hiatal hernia. Subcentimeter hypodensity of the right thyroid gland-no further follow-up indicated. Lungs/Pleura: Right upper lobe and lower lobe patchy airspace opacities. Stable left lower lobe 8 x 7 mm subpleural nodule (4:50). No pulmonary mass. No pleural  effusion. No pneumothorax. Musculoskeletal: No chest wall abnormality. Chronic diffuse axial and appendicular sclerotic osseous lesions. No acute displaced fracture. Multilevel degenerative changes of the spine. CT ABDOMEN PELVIS FINDINGS Hepatobiliary: Vague hypodensity along the falciform ligament likely focal fatty infiltration. No gallstones, gallbladder wall thickening, or pericholecystic fluid. No biliary dilatation. Pancreas: No focal lesion. Normal pancreatic contour. No surrounding inflammatory changes. No main pancreatic ductal dilatation. Spleen: Normal in size. Slightly more conspicuous splenic hypodensities measuring of 2 cm (2:53). Adrenals/Urinary Tract: Stable left adrenal gland nodules measuring of to 2.4 x 1.2 cm. Stable right adrenal gland nodules measuring up to 4.6 x 1.6 cm. Bilateral kidneys enhance symmetrically. No hydronephrosis. No hydroureter. The urinary bladder is unremarkable. Stomach/Bowel: Stomach is within normal limits. No evidence of bowel wall thickening or dilatation. Appendix appears normal. Vascular/Lymphatic: No abdominal aorta or iliac aneurysm. Mild atherosclerotic plaque of the aorta and its branches. No abdominal, pelvic, or inguinal lymphadenopathy. Reproductive: Uterus and bilateral adnexa are unremarkable. Other: No intraperitoneal free fluid. No intraperitoneal free gas. No organized fluid collection. Musculoskeletal: No abdominal wall hernia or abnormality. Chronic diffuse axial and appendicular sclerotic osseous lesions. No acute displaced fracture. Six lumbar vertebral bodies with sacralization of the L6 level. Grade 1 anterolisthesis L4 on L5. Mild retrolisthesis of L1 on L2. Bilateral intramedullary nail fixation of the femurs. Persistent periprosthetic lucency along the right femoral shaft surgical hardware. IMPRESSION: 1. Multifocal right lung pneumonia versus aspiration pneumonia. Underlying malignancy not fully excluded. 2. Stable left lower lobe 8 x 7 mm  subpleural nodule. 3. Stable indeterminate bilateral adrenal gland nodules. 4. Stable indeterminate splenic hypodensities. 5. Chronic diffuse axial and appendicular sclerotic osseous metastases. Electronically Signed   By: Tish Frederickson M.D.   On: 07/05/2023 02:23   CT HEAD WO CONTRAST ( )  Result Date: 07/05/2023 CLINICAL DATA:  Mental status change, unknown cause Pt  discharged yesterday after rod placement in L leg - pt altered since d/c yesterday. EXAM: CT HEAD WITHOUT CONTRAST TECHNIQUE: Contiguous axial images were obtained from the base of the skull through the vertex without intravenous contrast. RADIATION DOSE REDUCTION: This exam was performed according to the departmental dose-optimization program which includes automated exposure control, adjustment of the mA and/or kV according to patient size and/or use of iterative reconstruction technique. COMPARISON:  CT head 09/16/2022 FINDINGS: Brain: No evidence of large-territorial acute infarction. No definite parenchymal hemorrhage. No mass lesion. No definite extra-axial collection. A 3 mm hyperdensity in the region of the posterior horn of the right lateral ventricle. Question vague 4 mm hyperdensity versus artifact within the left frontal lobe (3:14). No mass effect or midline shift. No hydrocephalus. Basilar cisterns are patent. Vascular: No hyperdense vessel. Skull: No acute fracture or focal lesion. Sinuses/Orbits: Paranasal sinuses and mastoid air cells are clear. The orbits are unremarkable. Other: None. IMPRESSION: 1. A 3 mm hyperdensity in the region of the posterior horn of the right lateral ventricle. Indeterminate etiology with hemorrhage not excluded. Recommend follow-up CT in 4-6 hours to evaluate for stability. 2. Question vague 4 mm hyperdensity versus artifact within the left frontal lobe. Recommended attention on follow-up Electronically Signed   By: Tish Frederickson M.D.   On: 07/05/2023 02:01     PROCEDURES:  Critical Care  performed: Yes, see critical care procedure note(s)   CRITICAL CARE Performed by: Baxter Hire Umer Harig   Total critical care time: 40 minutes  Critical care time was exclusive of separately billable procedures and treating other patients.  Critical care was necessary to treat or prevent imminent or life-threatening deterioration.  Critical care was time spent personally by me on the following activities: development of treatment plan with patient and/or surrogate as well as nursing, discussions with consultants, evaluation of patient's response to treatment, examination of patient, obtaining history from patient or surrogate, ordering and performing treatments and interventions, ordering and review of laboratory studies, ordering and review of radiographic studies, pulse oximetry and re-evaluation of patient's condition.   Marland Kitchen1-3 Lead EKG Interpretation  Performed by: Yulisa Chirico, Layla Maw, DO Authorized by: Halimah Bewick, Layla Maw, DO     Interpretation: abnormal     ECG rate:  112   ECG rate assessment: tachycardic     Rhythm: sinus tachycardia     Ectopy: none     Conduction: normal       IMPRESSION / MDM / ASSESSMENT AND PLAN / ED COURSE  I reviewed the triage vital signs and the nursing notes.    Patient here with fever, altered mental status.  The patient is on the cardiac monitor to evaluate for evidence of arrhythmia and/or significant heart rate changes.   DIFFERENTIAL DIAGNOSIS (includes but not limited to):   Sepsis, bacteremia, pneumonia, UTI, meningitis, encephalitis, metabolic encephalopathy, infection of the left lower extremity, CVA, intracranial hemorrhage, mets to the brain   Patient's presentation is most consistent with acute presentation with potential threat to life or bodily function.   PLAN: Will obtain labs, urine, cultures, CT of the head, chest, abdomen and pelvis.  Will give IV fluids, broad-spectrum antibiotics.  She will need admission.   MEDICATIONS GIVEN IN  ED: Medications  lactated ringers bolus 1,000 mL (0 mLs Intravenous Stopped 07/05/23 0137)    And  lactated ringers bolus 1,000 mL (0 mLs Intravenous Stopped 07/05/23 0313)    And  lactated ringers bolus 500 mL (0 mLs Intravenous Stopped 07/05/23 0335)  ceFEPIme (  MAXIPIME) 2 g in sodium chloride 0.9 % 100 mL IVPB (0 g Intravenous Stopped 07/05/23 0039)  metroNIDAZOLE (FLAGYL) IVPB 500 mg (0 mg Intravenous Stopped 07/05/23 0200)  acetaminophen (TYLENOL) tablet 1,000 mg (1,000 mg Oral Given 07/05/23 0000)  vancomycin (VANCOCIN) IVPB 1000 mg/200 mL premix (0 mg Intravenous Stopped 07/05/23 0429)    Followed by  vancomycin (VANCOCIN) IVPB 1000 mg/200 mL premix (0 mg Intravenous Stopped 07/05/23 0313)  iohexol (OMNIPAQUE) 300 MG/ML solution 100 mL (100 mLs Intravenous Contrast Given 07/05/23 0109)  fentaNYL (SUBLIMAZE) injection 50 mcg (50 mcg Intravenous Given 07/05/23 0208)  fentaNYL (SUBLIMAZE) injection 50 mcg (50 mcg Intravenous Given 07/05/23 0331)     ED COURSE: Labs show leukocytosis of 11.6.  Lactic of 2.9.  Urine shows small leuks, red blood cells, white blood cells but no bacteria.  Culture pending.  COVID and flu negative.  CT scans reviewed and interpreted by myself and the radiologist and show multifocal right-sided pneumonia.  No signs of infection around the recent intramedullary nail.  CT head concerning for 3 mm hyperdensity in the posterior horn of the right lateral ventricle.  Hemorrhage cannot be excluded per radiology.  She does states she is on Eliquis.  Denies headache, falls.  Her mental status has significantly improved and she is A&O x 4.  She denies any pain.  Her lungs are clear.  Heart rate and blood pressure are improving.  Will repeat head CT at 7 AM.  Will discuss with hospitalist for admission.   Sepsis - Repeat Assessment  Performed at:    4:48 AM  Vitals     Blood pressure 99/71, pulse (!) 112, temperature 97.9 F (36.6 C), temperature source Oral, resp. rate  16, height 5\' 6"  (1.676 m), weight 81.4 kg, last menstrual period 09/15/2018, SpO2 99%.  Heart:     Tachycardic  Lungs:    CTA  Capillary Refill:   <2 sec  Peripheral Pulse:   Radial pulse palpable  Skin:     Normal Color     CONSULTS:  Consulted and discussed patient's case with hospitalist, Dr. Arville Care.  I have recommended admission and consulting physician agrees and will place admission orders.  Patient (and family if present) agree with this plan.   I reviewed all nursing notes, vitals, pertinent previous records.  All labs, EKGs, imaging ordered have been independently reviewed and interpreted by myself.    OUTSIDE RECORDS REVIEWED: Reviewed recent orthopedic notes on 06/30/2023.       FINAL CLINICAL IMPRESSION(S) / ED DIAGNOSES   Final diagnoses:  Altered mental status, unspecified altered mental status type  Multifocal pneumonia  Acute sepsis (HCC)     Rx / DC Orders   ED Discharge Orders     None        Note:  This document was prepared using Dragon voice recognition software and may include unintentional dictation errors.   Jaquelinne Glendening, Layla Maw, DO 07/05/23 [phone]

## 2023-07-04 NOTE — ED Triage Notes (Signed)
Pt arrives via [facility] from home. Pt discharged yesterday after rod placement in L leg - pt altered since d/c yesterday. Pt oriented to person only at this time.

## 2023-07-04 NOTE — Sepsis Progress Note (Signed)
Elink monitoring for the code sepsis protocol.  

## 2023-07-05 ENCOUNTER — Telehealth: Payer: Self-pay

## 2023-07-05 ENCOUNTER — Emergency Department: Payer: 59

## 2023-07-05 ENCOUNTER — Inpatient Hospital Stay: Payer: 59

## 2023-07-05 ENCOUNTER — Ambulatory Visit: Payer: 59

## 2023-07-05 DIAGNOSIS — R918 Other nonspecific abnormal finding of lung field: Secondary | ICD-10-CM | POA: Diagnosis not present

## 2023-07-05 DIAGNOSIS — R569 Unspecified convulsions: Secondary | ICD-10-CM | POA: Diagnosis not present

## 2023-07-05 DIAGNOSIS — J189 Pneumonia, unspecified organism: Secondary | ICD-10-CM | POA: Diagnosis not present

## 2023-07-05 DIAGNOSIS — R4182 Altered mental status, unspecified: Secondary | ICD-10-CM | POA: Diagnosis not present

## 2023-07-05 DIAGNOSIS — E2740 Unspecified adrenocortical insufficiency: Secondary | ICD-10-CM | POA: Diagnosis present

## 2023-07-05 DIAGNOSIS — K219 Gastro-esophageal reflux disease without esophagitis: Secondary | ICD-10-CM | POA: Diagnosis present

## 2023-07-05 DIAGNOSIS — F32A Depression, unspecified: Secondary | ICD-10-CM | POA: Diagnosis present

## 2023-07-05 DIAGNOSIS — K449 Diaphragmatic hernia without obstruction or gangrene: Secondary | ICD-10-CM | POA: Diagnosis not present

## 2023-07-05 DIAGNOSIS — D649 Anemia, unspecified: Secondary | ICD-10-CM | POA: Diagnosis present

## 2023-07-05 DIAGNOSIS — Z85118 Personal history of other malignant neoplasm of bronchus and lung: Secondary | ICD-10-CM | POA: Diagnosis not present

## 2023-07-05 DIAGNOSIS — Z87891 Personal history of nicotine dependence: Secondary | ICD-10-CM | POA: Diagnosis not present

## 2023-07-05 DIAGNOSIS — C3490 Malignant neoplasm of unspecified part of unspecified bronchus or lung: Secondary | ICD-10-CM | POA: Diagnosis not present

## 2023-07-05 DIAGNOSIS — C801 Malignant (primary) neoplasm, unspecified: Secondary | ICD-10-CM | POA: Diagnosis not present

## 2023-07-05 DIAGNOSIS — E785 Hyperlipidemia, unspecified: Secondary | ICD-10-CM | POA: Diagnosis present

## 2023-07-05 DIAGNOSIS — G4089 Other seizures: Secondary | ICD-10-CM | POA: Diagnosis present

## 2023-07-05 DIAGNOSIS — C7972 Secondary malignant neoplasm of left adrenal gland: Secondary | ICD-10-CM | POA: Diagnosis present

## 2023-07-05 DIAGNOSIS — Z515 Encounter for palliative care: Secondary | ICD-10-CM | POA: Diagnosis not present

## 2023-07-05 DIAGNOSIS — A419 Sepsis, unspecified organism: Secondary | ICD-10-CM | POA: Diagnosis present

## 2023-07-05 DIAGNOSIS — I420 Dilated cardiomyopathy: Secondary | ICD-10-CM | POA: Diagnosis present

## 2023-07-05 DIAGNOSIS — E872 Acidosis, unspecified: Secondary | ICD-10-CM | POA: Diagnosis present

## 2023-07-05 DIAGNOSIS — Z1152 Encounter for screening for COVID-19: Secondary | ICD-10-CM | POA: Diagnosis not present

## 2023-07-05 DIAGNOSIS — Z79899 Other long term (current) drug therapy: Secondary | ICD-10-CM | POA: Diagnosis not present

## 2023-07-05 DIAGNOSIS — Z8249 Family history of ischemic heart disease and other diseases of the circulatory system: Secondary | ICD-10-CM | POA: Diagnosis not present

## 2023-07-05 DIAGNOSIS — G939 Disorder of brain, unspecified: Secondary | ICD-10-CM | POA: Diagnosis not present

## 2023-07-05 DIAGNOSIS — C7951 Secondary malignant neoplasm of bone: Secondary | ICD-10-CM | POA: Diagnosis not present

## 2023-07-05 DIAGNOSIS — C7971 Secondary malignant neoplasm of right adrenal gland: Secondary | ICD-10-CM | POA: Diagnosis present

## 2023-07-05 DIAGNOSIS — I10 Essential (primary) hypertension: Secondary | ICD-10-CM | POA: Diagnosis present

## 2023-07-05 DIAGNOSIS — C7931 Secondary malignant neoplasm of brain: Secondary | ICD-10-CM | POA: Diagnosis not present

## 2023-07-05 DIAGNOSIS — C3491 Malignant neoplasm of unspecified part of right bronchus or lung: Secondary | ICD-10-CM | POA: Diagnosis present

## 2023-07-05 DIAGNOSIS — C3480 Malignant neoplasm of overlapping sites of unspecified bronchus and lung: Secondary | ICD-10-CM | POA: Diagnosis not present

## 2023-07-05 DIAGNOSIS — R6520 Severe sepsis without septic shock: Secondary | ICD-10-CM | POA: Diagnosis present

## 2023-07-05 DIAGNOSIS — Z833 Family history of diabetes mellitus: Secondary | ICD-10-CM | POA: Diagnosis not present

## 2023-07-05 DIAGNOSIS — Z7901 Long term (current) use of anticoagulants: Secondary | ICD-10-CM | POA: Diagnosis not present

## 2023-07-05 DIAGNOSIS — C779 Secondary and unspecified malignant neoplasm of lymph node, unspecified: Secondary | ICD-10-CM | POA: Diagnosis present

## 2023-07-05 LAB — CBC WITH DIFFERENTIAL/PLATELET
Abs Immature Granulocytes: 0.1 10*3/uL — ABNORMAL HIGH (ref 0.00–0.07)
Basophils Absolute: 0 10*3/uL (ref 0.0–0.1)
Basophils Relative: 0 %
Eosinophils Absolute: 0.2 10*3/uL (ref 0.0–0.5)
Eosinophils Relative: 2 %
HCT: 27 % — ABNORMAL LOW (ref 36.0–46.0)
Hemoglobin: 8.6 g/dL — ABNORMAL LOW (ref 12.0–15.0)
Immature Granulocytes: 1 %
Lymphocytes Relative: 13 %
Lymphs Abs: 1.3 10*3/uL (ref 0.7–4.0)
MCH: 27.9 pg (ref 26.0–34.0)
MCHC: 31.9 g/dL (ref 30.0–36.0)
MCV: 87.7 fL (ref 80.0–100.0)
Monocytes Absolute: 0.8 10*3/uL (ref 0.1–1.0)
Monocytes Relative: 8 %
Neutro Abs: 7.6 10*3/uL (ref 1.7–7.7)
Neutrophils Relative %: 76 %
Platelets: 222 10*3/uL (ref 150–400)
RBC: 3.08 MIL/uL — ABNORMAL LOW (ref 3.87–5.11)
RDW: 16 % — ABNORMAL HIGH (ref 11.5–15.5)
WBC: 10 10*3/uL (ref 4.0–10.5)
nRBC: 0 % (ref 0.0–0.2)

## 2023-07-05 LAB — COMPREHENSIVE METABOLIC PANEL
ALT: 10 U/L (ref 0–44)
AST: 16 U/L (ref 15–41)
Albumin: 2.4 g/dL — ABNORMAL LOW (ref 3.5–5.0)
Alkaline Phosphatase: 99 U/L (ref 38–126)
Anion gap: 8 (ref 5–15)
BUN: 9 mg/dL (ref 6–20)
CO2: 23 mmol/L (ref 22–32)
Calcium: 8.2 mg/dL — ABNORMAL LOW (ref 8.9–10.3)
Chloride: 101 mmol/L (ref 98–111)
Creatinine, Ser: 0.44 mg/dL (ref 0.44–1.00)
GFR, Estimated: >60 mL/min (ref >60)
Glucose, Bld: 127 mg/dL — ABNORMAL HIGH (ref 70–99)
Potassium: 3.4 mmol/L — ABNORMAL LOW (ref 3.5–5.1)
Sodium: 132 mmol/L — ABNORMAL LOW (ref 135–145)
Total Bilirubin: 1 mg/dL (ref 0.3–1.2)
Total Protein: 6.1 g/dL — ABNORMAL LOW (ref 6.5–8.1)

## 2023-07-05 LAB — PROTIME-INR
INR: 1.3 — ABNORMAL HIGH (ref 0.8–1.2)
Prothrombin Time: 16.2 s — ABNORMAL HIGH (ref 11.4–15.2)

## 2023-07-05 LAB — RETICULOCYTES
Immature Retic Fract: 29.2 % — ABNORMAL HIGH (ref 2.3–15.9)
RBC.: 3.06 MIL/uL — ABNORMAL LOW (ref 3.87–5.11)
Retic Count, Absolute: 49.3 10*3/uL (ref 19.0–186.0)
Retic Ct Pct: 1.6 % (ref 0.4–3.1)

## 2023-07-05 LAB — URINALYSIS, W/ REFLEX TO CULTURE (INFECTION SUSPECTED)
Bacteria, UA: NONE SEEN
Bilirubin Urine: NEGATIVE
Glucose, UA: NEGATIVE mg/dL
Hgb urine dipstick: NEGATIVE
Ketones, ur: NEGATIVE mg/dL
Nitrite: NEGATIVE
Protein, ur: NEGATIVE mg/dL
Specific Gravity, Urine: 1.024 (ref 1.005–1.030)
pH: 7 (ref 5.0–8.0)

## 2023-07-05 LAB — RESP PANEL BY RT-PCR (RSV, FLU A&B, COVID)  RVPGX2
Influenza A by PCR: NEGATIVE
Influenza B by PCR: NEGATIVE
Resp Syncytial Virus by PCR: NEGATIVE
SARS Coronavirus 2 by RT PCR: NEGATIVE

## 2023-07-05 LAB — URINALYSIS, COMPLETE (UACMP) WITH MICROSCOPIC
Bacteria, UA: NONE SEEN
Bilirubin Urine: NEGATIVE
Glucose, UA: NEGATIVE mg/dL
Hgb urine dipstick: NEGATIVE
Ketones, ur: NEGATIVE mg/dL
Leukocytes,Ua: NEGATIVE
Nitrite: NEGATIVE
Protein, ur: NEGATIVE mg/dL
Specific Gravity, Urine: 1.021 (ref 1.005–1.030)
pH: 7 (ref 5.0–8.0)

## 2023-07-05 LAB — HIV ANTIBODY (ROUTINE TESTING W REFLEX): HIV Screen 4th Generation wRfx: NONREACTIVE

## 2023-07-05 LAB — IRON AND TIBC
Iron: 23 ug/dL — ABNORMAL LOW (ref 28–170)
Saturation Ratios: 12 % (ref 10.4–31.8)
TIBC: 193 ug/dL — ABNORMAL LOW (ref 250–450)
UIBC: 170 ug/dL

## 2023-07-05 LAB — PROCALCITONIN: Procalcitonin: 0.31 ng/mL

## 2023-07-05 LAB — URINE DRUG SCREEN, QUALITATIVE (ARMC ONLY)
Amphetamines, Ur Screen: NOT DETECTED
Barbiturates, Ur Screen: NOT DETECTED
Benzodiazepine, Ur Scrn: POSITIVE — AB
Cannabinoid 50 Ng, Ur ~~LOC~~: NOT DETECTED
Cocaine Metabolite,Ur ~~LOC~~: NOT DETECTED
MDMA (Ecstasy)Ur Screen: NOT DETECTED
Methadone Scn, Ur: NOT DETECTED
Opiate, Ur Screen: POSITIVE — AB
Phencyclidine (PCP) Ur S: NOT DETECTED
Tricyclic, Ur Screen: NOT DETECTED

## 2023-07-05 LAB — FERRITIN: Ferritin: 467 ng/mL — ABNORMAL HIGH (ref 11–307)

## 2023-07-05 LAB — LACTIC ACID, PLASMA: Lactic Acid, Venous: 1.4 mmol/L (ref 0.5–1.9)

## 2023-07-05 LAB — APTT: aPTT: 43 s — ABNORMAL HIGH (ref 24–36)

## 2023-07-05 MED ORDER — IOHEXOL 300 MG/ML  SOLN
100.0000 mL | Freq: Once | INTRAMUSCULAR | Status: AC | PRN
  Administered 2023-07-05: 100 mL via INTRAVENOUS

## 2023-07-05 MED ORDER — GADOBUTROL 1 MMOL/ML IV SOLN
7.5000 mL | Freq: Once | INTRAVENOUS | Status: AC | PRN
  Administered 2023-07-05: 7.5 mL via INTRAVENOUS

## 2023-07-05 MED ORDER — LEVETIRACETAM 500 MG PO TABS
500.0000 mg | ORAL_TABLET | Freq: Two times a day (BID) | ORAL | Status: DC
  Administered 2023-07-05 – 2023-07-06 (×2): 500 mg via ORAL
  Filled 2023-07-05 (×6): qty 1

## 2023-07-05 MED ORDER — SODIUM CHLORIDE 0.9 % IV SOLN
3.0000 g | Freq: Four times a day (QID) | INTRAVENOUS | Status: DC
  Administered 2023-07-05 – 2023-07-06 (×6): 3 g via INTRAVENOUS
  Filled 2023-07-05 (×9): qty 8

## 2023-07-05 MED ORDER — AZITHROMYCIN 250 MG PO TABS
500.0000 mg | ORAL_TABLET | Freq: Every day | ORAL | Status: DC
  Administered 2023-07-05 – 2023-07-09 (×5): 500 mg via ORAL
  Filled 2023-07-05: qty 1
  Filled 2023-07-05 (×2): qty 2
  Filled 2023-07-05: qty 1
  Filled 2023-07-05: qty 2

## 2023-07-05 MED ORDER — LEVETIRACETAM 500 MG PO TABS
500.0000 mg | ORAL_TABLET | Freq: Two times a day (BID) | ORAL | Status: DC
  Administered 2023-07-05: 500 mg via ORAL
  Filled 2023-07-05: qty 1

## 2023-07-05 MED ORDER — ENOXAPARIN SODIUM 40 MG/0.4ML IJ SOSY
40.0000 mg | PREFILLED_SYRINGE | INTRAMUSCULAR | Status: DC
Start: 2023-07-05 — End: 2023-07-09
  Administered 2023-07-05 – 2023-07-08 (×4): 40 mg via SUBCUTANEOUS
  Filled 2023-07-05 (×4): qty 0.4

## 2023-07-05 MED ORDER — ACETAMINOPHEN 500 MG PO TABS
500.0000 mg | ORAL_TABLET | Freq: Four times a day (QID) | ORAL | Status: DC | PRN
  Administered 2023-07-05: 500 mg via ORAL
  Filled 2023-07-05: qty 1

## 2023-07-05 MED ORDER — LEVETIRACETAM IN NACL 1500 MG/100ML IV SOLN
1500.0000 mg | Freq: Once | INTRAVENOUS | Status: AC
  Administered 2023-07-05: 1500 mg via INTRAVENOUS
  Filled 2023-07-05: qty 100

## 2023-07-05 MED ORDER — GUAIFENESIN ER 600 MG PO TB12
1200.0000 mg | ORAL_TABLET | Freq: Two times a day (BID) | ORAL | Status: DC
  Administered 2023-07-05 – 2023-07-09 (×8): 1200 mg via ORAL
  Filled 2023-07-05 (×9): qty 2

## 2023-07-05 MED ORDER — HYDROMORPHONE HCL 2 MG PO TABS
2.0000 mg | ORAL_TABLET | Freq: Once | ORAL | Status: DC
  Filled 2023-07-05: qty 1

## 2023-07-05 MED ORDER — HYDROMORPHONE HCL 2 MG PO TABS
2.0000 mg | ORAL_TABLET | ORAL | Status: DC | PRN
  Administered 2023-07-05: 4 mg via ORAL
  Administered 2023-07-05: 2 mg via ORAL
  Administered 2023-07-06: 4 mg via ORAL
  Administered 2023-07-06: 2 mg via ORAL
  Administered 2023-07-07 (×2): 4 mg via ORAL
  Filled 2023-07-05: qty 1
  Filled 2023-07-05 (×4): qty 2
  Filled 2023-07-05: qty 1

## 2023-07-05 MED ORDER — FENTANYL CITRATE PF 50 MCG/ML IJ SOSY
50.0000 ug | PREFILLED_SYRINGE | Freq: Once | INTRAMUSCULAR | Status: AC
  Administered 2023-07-05: 50 ug via INTRAVENOUS
  Filled 2023-07-05: qty 1

## 2023-07-05 MED ORDER — METOPROLOL TARTRATE 5 MG/5ML IV SOLN
2.5000 mg | INTRAVENOUS | Status: DC | PRN
  Administered 2023-07-05 – 2023-07-08 (×2): 2.5 mg via INTRAVENOUS
  Filled 2023-07-05 (×3): qty 5

## 2023-07-05 MED ORDER — LORAZEPAM 2 MG/ML IJ SOLN
0.5000 mg | Freq: Four times a day (QID) | INTRAMUSCULAR | Status: DC | PRN
  Administered 2023-07-05: 0.5 mg via INTRAVENOUS
  Filled 2023-07-05: qty 1

## 2023-07-05 MED ORDER — CITALOPRAM HYDROBROMIDE 20 MG PO TABS
10.0000 mg | ORAL_TABLET | Freq: Every day | ORAL | Status: DC
  Administered 2023-07-05 – 2023-07-08 (×4): 10 mg via ORAL
  Filled 2023-07-05 (×5): qty 1

## 2023-07-05 MED ORDER — ALPRAZOLAM 0.5 MG PO TABS
0.5000 mg | ORAL_TABLET | Freq: Every day | ORAL | Status: DC | PRN
  Administered 2023-07-05 – 2023-07-08 (×3): 0.5 mg via ORAL
  Filled 2023-07-05 (×3): qty 1

## 2023-07-05 MED ORDER — HYDROCORTISONE SOD SUC (PF) 100 MG IJ SOLR
100.0000 mg | Freq: Two times a day (BID) | INTRAMUSCULAR | Status: DC
  Administered 2023-07-05 – 2023-07-07 (×5): 100 mg via INTRAVENOUS
  Filled 2023-07-05 (×5): qty 2

## 2023-07-05 MED ORDER — ONDANSETRON HCL 4 MG/2ML IJ SOLN: 4.0000 mg | Freq: Four times a day (QID) | INTRAMUSCULAR | Status: DC | PRN

## 2023-07-05 MED ORDER — FENTANYL 25 MCG/HR TD PT72
1.0000 | MEDICATED_PATCH | TRANSDERMAL | Status: DC
  Administered 2023-07-05: 1 via TRANSDERMAL
  Filled 2023-07-05: qty 1

## 2023-07-05 NOTE — Progress Notes (Signed)
Pharmacy Antibiotic Note  Gina Sanchez is a 57 y.o. female admitted on 07/04/2023 with  aspiration pneumonia .  Pharmacy has been consulted for Unasyn dosing.  Patient has already received vancomycin 2 g IV x 1, cefepime 2 g IV x 1, and metronidazole 500 mg IV x 1   Plan: Start Unasyn 3 g IV q6h Patient is also on azithromycin 500 mg PO Q24H Continue to monitor renal function and follow culture results   Height: 5\' 6"  (167.6 cm) Weight: 81.4 kg (179 lb 6.4 oz) IBW/kg (Calculated) : 59.3  Temp (24hrs), Avg:99.5 F (37.5 C), Min:97.9 F (36.6 C), Max:102.2 F (39 C)  Recent Labs  Lab 07/02/23 0903 07/04/23 2310 07/05/23 0416  WBC 10.4 11.6* 10.0  CREATININE  --  0.56 0.44  LATICACIDVEN 1.6 2.9* 1.4    Estimated Creatinine Clearance: 84.4 mL/min (by C-G formula based on SCr of 0.44 mg/dL).    Allergies  Allergen Reactions   Factive [Gemifloxacin] Rash    Antimicrobials this admission: 10/21 Vancomycin x 1 dose 10/21 Cefepime x 1 dose 10/21 Metronidazole x 1 dose 10/22 Azithromycin>> 10/22 Unasyn>>  Dose adjustments this admission: None  Microbiology results: 10/21 BCx: IP   Thank you for allowing pharmacy to be a part of this patient's care.  Merryl Hacker, PharmD Clinical Pharmacist 07/05/2023 7:57 AM

## 2023-07-05 NOTE — ED Notes (Signed)
Pt medicated for pain and in anticipation of CT scan.

## 2023-07-05 NOTE — H&P (Signed)
History and Physical    Gina Sanchez ZOX:[id number] DOB: [birth year] DOA: 07/04/2023  PCP: Armando Gang, FNP (Confirm with patient/family/NH records and if not entered, this has to be entered at [facility] point of entry) Patient coming from: Home  I have personally briefly reviewed patient's old medical records in [facility]  Chief Complaint: Feeling weak, fever.  HPI: Gina Sanchez is a 57 y.o. 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  Patient still somewhat confused, provided only partial history.  Most of history provided by husband over the phone.  According to husband, patient underwent left hip surgery last week on 10/17.  Patient recovered pretty well and discharged home 10/18.  During her hospital stay, medicine was consulted for evaluation of tachycardia.  I went to see patient on 10/18, patient was afebrile and workup including checks x-ray negative for pneumonia.  Patient was discharged home with home PT 10/20.  Husband reported that since the discharge patient has been having frequent feeling of nausea but no vomiting, and patient continued to complain about poor oral intake.  Whole day yesterday patient just drank a bottle of Ensure but no real meal.  Patient had a normal PT evaluation yesterday and vital signs were normal, afebrile as per PT record.  Yesterday evening, after dinnertime, patient suddenly became confused, and warm to touch.  And husband called EMS.  ED Course: Temperature retinyl 2.2, tachycardia blood pressure initially elevated but later developed hypotension blood pressure SBP in the range of 80-90, DBP 50-70.  Tachypneic and O2 saturation 100% on room air.  Blood work showed WBC 10,  hemoglobin 8.6 compared to 10.2, 2 days ago.  Patient was given vancomycin and cefepime and blood cultures drawn.  Review of Systems: Unable to perform, patient is confused.  Past Medical History:  Diagnosis Date   Abnormal Pap smear of cervix    Anxiety    C. 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   Metastatic cancer (HCC) 10/2020   lung, bone, lymph node, adrenal   Palpitations    Precordial chest pain    Wrist fracture 03/2021   left    Past Surgical History:  Procedure Laterality Date   BREAST BIOPSY Right 2017   benign   CERVICAL BIOPSY  W/ LOOP ELECTRODE EXCISION     COLONOSCOPY  06/11/2022   Procedure: COLONOSCOPY;  Surgeon: Toney Reil, MD;  Location: [facility] ENDOSCOPY;  Service: Gastroenterology;;   COLONOSCOPY WITH PROPOFOL N/A 07/03/2015   Procedure: COLONOSCOPY WITH PROPOFOL;  Surgeon: Wallace Cullens, MD;  Location: [facility] ENDOSCOPY;  Service: Gastroenterology;  Laterality: N/A;   COLONOSCOPY WITH PROPOFOL N/A 01/06/2022   Procedure: COLONOSCOPY WITH PROPOFOL;  Surgeon: Toney Reil, MD;  Location: [facility] ENDOSCOPY;  Service: Gastroenterology;  Laterality: N/A;   ESOPHAGOGASTRODUODENOSCOPY (EGD) WITH PROPOFOL N/A 07/03/2015  Procedure: ESOPHAGOGASTRODUODENOSCOPY (EGD) WITH PROPOFOL;  Surgeon: Wallace Cullens, MD;  Location: [facility] ENDOSCOPY;  Service: Gastroenterology;  Laterality: N/A;   FEMUR IM NAIL Left 06/30/2023   Procedure: Left prophylactic intramedullary nailing of impending left subtrochanteric femur fracture;  Surgeon: Christena Flake, MD;  Location: [facility] ORS;  Service:  Orthopedics;  Laterality: Left;   FLEXIBLE SIGMOIDOSCOPY N/A 03/12/2022   Procedure: FLEXIBLE SIGMOIDOSCOPY;  Surgeon: Toney Reil, MD;  Location: [facility] ENDOSCOPY;  Service: Gastroenterology;  Laterality: N/A;   FLEXIBLE SIGMOIDOSCOPY N/A 08/17/2022   Procedure: FLEXIBLE SIGMOIDOSCOPY;  Surgeon: Toney Reil, MD;  Location: [facility] ENDOSCOPY;  Service: Gastroenterology;  Laterality: N/A;   INTRAMEDULLARY (IM) NAIL INTERTROCHANTERIC Right 09/15/2020   Procedure: INTRAMEDULLARY (IM) NAIL INTERTROCHANTRIC;  Surgeon: Christena Flake, MD;  Location: [facility] ORS;  Service: Orthopedics;  Laterality: Right;   IR CV LINE INJECTION  07/14/2022   IR IMAGING GUIDED PORT INSERTION  11/28/2020   LEFT HEART CATH AND CORONARY ANGIOGRAPHY N/A 03/28/2017   Procedure: Left Heart Cath and Coronary Angiography;  Surgeon: Runell Gess, MD;  Location: [facility] INVASIVE CV LAB;  Service: Cardiovascular;  Laterality: N/A;   ORIF WRIST FRACTURE Left 03/31/2021   Procedure: OPEN REDUCTION INTERNAL FIXATION (ORIF) LEFT DISTAL RADIUS FRACTURE.;  Surgeon: Christena Flake, MD;  Location: [facility] ORS;  Service: Orthopedics;  Laterality: Left;     reports that she quit smoking about 2 years ago. Her smoking use included cigarettes. She started smoking about 32 years ago. She has a 30 pack-year smoking history. She has never used smokeless tobacco. She reports that she does not currently use alcohol after a past usage of about 1.0 standard drink of alcohol per week. She reports that she does not use drugs.  Allergies  Allergen Reactions   Factive [Gemifloxacin] Rash    Family History  Problem Relation Age of Onset   Diabetes Mother        alive @ 25   Goiter Mother    Heart disease Father        died of MI @ 41   Osteoporosis Maternal Grandmother    Colon cancer Maternal Grandfather    Breast cancer Neg Hx    Ovarian cancer Neg Hx      Prior to Admission medications   Medication Sig Start Date End Date Taking?  Authorizing Provider  acetaminophen (TYLENOL) 500 MG tablet Take 500 mg by mouth every 6 (six) hours as needed.   Yes [provider]  ALPRAZolam (XANAX) 0.5 MG tablet TAKE 1 TABLET BY MOUTH DAILY AS NEEDED FOR ANXIETY. 06/01/23  Yes Creig Hines, MD  citalopram (CELEXA) 10 MG tablet Take 1 tablet (10 mg total) by mouth daily. Patient taking differently: Take 10 mg by mouth at bedtime. 02/17/23  Yes Creig Hines, MD  fentaNYL (DURAGESIC) 25 MCG/HR Place 1 patch onto the skin every 3 (three) days. 06/20/23  Yes Borders, Daryl Eastern, NP  HYDROcodone bit-homatropine (HYCODAN) 5-1.5 MG/5ML syrup Take 5 mLs by mouth every 6 (six) hours as needed for cough. 05/24/23  Yes Creig Hines, MD  hydrocortisone (CORTEF) 10 MG tablet TAKE 1 TABLET BY MOUTH NIGHTLY 01/25/23  Yes Creig Hines, MD  hydrocortisone (CORTEF) 20 MG tablet TAKE 1 TABLET BY MOUTH EVERY MORNING 05/09/23  Yes Creig Hines, MD  HYDROmorphone (DILAUDID) 2 MG tablet Take 1-2 tablets (2-4 mg total) by mouth every 4 (four) hours as needed for severe pain. 06/20/23  Yes Borders, Daryl Eastern, NP  lidocaine (LIDODERM) 5 % Place 1 patch onto the skin daily. Remove & Discard patch within 12 hours or as directed by MD   Yes [provider]  metoprolol succinate (TOPROL-XL) 25 MG 24 hr tablet Take 1 tablet (25 mg total) by mouth daily. 07/03/23  Yes Rahma Meller, Renae Fickle, MD  OLANZapine (ZYPREXA) 5 MG tablet Take 1 tablet (5 mg total) by mouth at bedtime. 04/25/23  Yes Borders, Daryl Eastern, NP  ondansetron (ZOFRAN) 4 MG tablet TAKE 1 TABLET BY MOUTH EVERY 8 HOURS AS NEEDED FOR NAUSEA AND VOMITING Patient taking differently: Take 4 mg by mouth every 8 (eight) hours as needed for nausea or vomiting. TAKE 1 TABLET BY MOUTH EVERY 8 HOURS AS NEEDED FOR NAUSEA AND VOMITING 12/29/22  Yes Covington, Sarah M, PA-C  prochlorperazine (COMPAZINE) 10 MG tablet Take 1 tablet (10 mg total) by mouth every 6 (six) hours as needed for nausea or vomiting. 12/17/22  Yes Creig Hines, MD  apixaban (ELIQUIS) 2.5 MG TABS tablet Take 1 tablet (2.5 mg total) by mouth 2 (two) times daily for 14 days. Patient not taking: Reported on 07/05/2023 07/01/23 07/15/23  Evon Slack, PA-C  Vedolizumab (ENTYVIO) 108 MG/0.68ML SOPN Inject 1 Pen into the skin every 14 (fourteen) days. Patient taking differently: Inject 1 Pen into the skin every 30 (thirty) days. 05/26/23   Toney Reil, MD    Physical Exam: Vitals:   07/05/23 0530 07/05/23 0600 07/05/23 0630 07/05/23 0730  BP: 91/73 101/72 98/76 112/72  Pulse: (!) 109 (!) 110 (!) 112 (!) 109  Resp: 15 19 18 14   Temp:      TempSrc:      SpO2: 99% 100% 100% 98%  Weight:      Height:        Constitutional: NAD, calm, comfortable Vitals:   07/05/23 0530 07/05/23 0600 07/05/23 0630 07/05/23 0730  BP: 91/73 101/72 98/76 112/72  Pulse: (!) 109 (!) 110 (!) 112 (!) 109  Resp: 15 19 18 14   Temp:      TempSrc:      SpO2: 99% 100% 100% 98%  Weight:      Height:       Eyes: PERRL, lids and conjunctivae normal ENMT: Mucous membranes are moist. Posterior pharynx clear of any exudate or lesions.Normal dentition.  Neck: normal, supple, no masses, no thyromegaly Respiratory: clear to auscultation bilaterally, no wheezing, no crackles. Normal respiratory effort. No accessory muscle use.  Cardiovascular: Regular rate and rhythm, no murmurs / rubs / gallops. No extremity edema. 2+ pedal pulses. No carotid bruits.  Abdomen: no tenderness, no masses palpated. No hepatosplenomegaly. Bowel sounds positive.  Musculoskeletal: no clubbing / cyanosis. No joint deformity upper and lower extremities. Good ROM, no contractures. Normal muscle tone.  Skin: no rashes, lesions, ulcers. No induration.  Surgical site clean, no acute bleeding or discharge Neurologic: No facial droops, moving all limbs, following simple commands.  During conversation, patient had frequent absent-minded episodes Psychiatric: Oriented to person and place,  confused about time    Labs on Admission: I have personally reviewed following labs and imaging studies  CBC: Recent Labs  Lab 07/02/23 0903 07/04/23 2310 07/05/23 0416  WBC 10.4 11.6* 10.0  NEUTROABS  --  7.5 7.6  HGB 9.9* 10.2* 8.6*  HCT 30.4* 31.6* 27.0*  MCV 86.1 86.1 87.7  PLT 217 278 222   Basic Metabolic Panel: Recent Labs  Lab 07/04/23 2310 07/05/23 0416  NA 130* 132*  K 3.6  3.4*  CL 97* 101  CO2 20* 23  GLUCOSE 96 127*  BUN 13 9  CREATININE 0.56 0.44  CALCIUM 8.6* 8.2*   GFR: Estimated Creatinine Clearance: 84.4 mL/min (by C-G formula based on SCr of 0.44 mg/dL). Liver Function Tests: Recent Labs  Lab 07/04/23 2310 07/05/23 0416  AST 20 16  ALT 12 10  ALKPHOS 116 99  BILITOT 1.0 1.0  PROT 7.0 6.1*  ALBUMIN 2.8* 2.4*   No results for input(s): "LIPASE", "AMYLASE" in the last 168 hours. No results for input(s): "AMMONIA" in the last 168 hours. Coagulation Profile: Recent Labs  Lab 07/04/23 2310  INR 1.3*   Cardiac Enzymes: No results for input(s): "CKTOTAL", "CKMB", "CKMBINDEX", "TROPONINI" in the last 168 hours. BNP (last 3 results) No results for input(s): "PROBNP" in the last 8760 hours. HbA1C: No results for input(s): "HGBA1C" in the last 72 hours. CBG: No results for input(s): "GLUCAP" in the last 168 hours. Lipid Profile: No results for input(s): "CHOL", "HDL", "LDLCALC", "TRIG", "CHOLHDL", "LDLDIRECT" in the last 72 hours. Thyroid Function Tests: No results for input(s): "TSH", "T4TOTAL", "FREET4", "T3FREE", "THYROIDAB" in the last 72 hours. Anemia Panel: No results for input(s): "VITAMINB12", "FOLATE", "FERRITIN", "TIBC", "IRON", "RETICCTPCT" in the last 72 hours. Urine analysis:    Component Value Date/Time   COLORURINE YELLOW (A) 07/04/2023 0150   APPEARANCEUR CLOUDY (A) 07/04/2023 0150   APPEARANCEUR Clear 04/25/2018 0925   LABSPEC 1.024 07/04/2023 0150   PHURINE 7.0 07/04/2023 0150   GLUCOSEU NEGATIVE 07/04/2023 0150    HGBUR NEGATIVE 07/04/2023 0150   BILIRUBINUR NEGATIVE 07/04/2023 0150   BILIRUBINUR Negative 04/25/2018 0925   KETONESUR NEGATIVE 07/04/2023 0150   PROTEINUR NEGATIVE 07/04/2023 0150   UROBILINOGEN 0.2 12/26/2013 1525   NITRITE NEGATIVE 07/04/2023 0150   LEUKOCYTESUR SMALL (A) 07/04/2023 0150    Radiological Exams on Admission: CT HEAD WO CONTRAST ( )  Result Date: 07/05/2023 CLINICAL DATA:  57 year old female altered mental status. Possible trace hemorrhage layering in the right occipital horn on head CT 0055 hours today., and small left anterior frontal lobe hyperdensity also. Metastatic lung cancer. EXAM: CT HEAD WITHOUT CONTRAST TECHNIQUE: Contiguous axial images were obtained from the base of the skull through the vertex without intravenous contrast. RADIATION DOSE REDUCTION: This exam was performed according to the departmental dose-optimization program which includes automated exposure control, adjustment of the mA and/or kV according to patient size and/or use of iterative reconstruction technique. COMPARISON:  Head CT 0055 hours today.  Brain MRI 01/20/2021. FINDINGS: Brain: Unchanged small 5 mm hyperdense foci in both the left anterior frontal and right occipital lobes both visible on series 2, image 13 now. No associated edema or mass effect by CT. The right occipital lesion now is seen to be separate from the occipital horn which is on series 2, image 15. These appear to be parenchymal. Furthermore, there is a subtle 3rd hyperdense lesion in the right anterior inferior temporal lobe sagittal image 21. And possibly additional punctate lesions such as the left frontal lobe on series 2, image 17. All of these are new since 09/16/2022. No midline shift or intracranial mass effect. No ventriculomegaly. No extra-axial blood identified. No cortically based acute infarct identified. Normal basilar cisterns. Vascular: No suspicious intracranial vascular hyperdensity. Skull: Abnormal left frontal  bone appears infiltrated with abnormal sclerosis and periosteal reaction on series 3, image 51, new since 09/16/2022. This is compatible with skull metastasis. No superimposed skull fracture is identified. No lytic bone lesion is identified. Sinuses/Orbits: Visualized  paranasal sinuses and mastoids are stable and well aerated. Other: Visualized orbits and scalp soft tissues are within normal limits. IMPRESSION: 1. 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 2. At least 3 subcentimeter hyperdense foci in the bilateral brain are more likely small hyperdense Brain Metastases than areas of intracranial blood. Brain MRI without and with contrast should be confirmatory and could be compared to 01/20/2021. 3. No associated intracranial mass effect.  No cerebral edema by CT. Electronically Signed   By: Odessa Fleming M.D.   On: 07/05/2023 07:54   CT CHEST ABDOMEN PELVIS W CONTRAST  Result Date: 07/05/2023 CLINICAL DATA:  Sepsis.  History of metastatic lung cancer. EXAM: CT CHEST, ABDOMEN, AND PELVIS WITH CONTRAST TECHNIQUE: Multidetector CT imaging of the chest, abdomen and pelvis was performed following the standard protocol during bolus administration of intravenous contrast. RADIATION DOSE REDUCTION: This exam was performed according to the departmental dose-optimization program which includes automated exposure control, adjustment of the mA and/or kV according to patient size and/or use of iterative reconstruction technique. CONTRAST:  OMNIPAQUE IOHEXOL 300 MG/ML  SOLN COMPARISON:  CT chest abdomen pelvis 04/19/2023 FINDINGS: CT CHEST FINDINGS Cardiovascular: Right chest wall accessed Port-A-Cath with tip at the superior cavoatrial junction. Normal heart size. No significant pericardial effusion. The thoracic aorta is normal in caliber. No atherosclerotic plaque of the thoracic aorta. No coronary artery calcifications. Mediastinum/Nodes: Stable 1 cm subcarinal lymph node. No enlarged  mediastinal, hilar, or axillary lymph nodes. Trachea and esophagus demonstrate no significant findings. Tiny hiatal hernia. Subcentimeter hypodensity of the right thyroid gland-no further follow-up indicated. Lungs/Pleura: Right upper lobe and lower lobe patchy airspace opacities. Stable left lower lobe 8 x 7 mm subpleural nodule (4:50). No pulmonary mass. No pleural effusion. No pneumothorax. Musculoskeletal: No chest wall abnormality. Chronic diffuse axial and appendicular sclerotic osseous lesions. No acute displaced fracture. Multilevel degenerative changes of the spine. CT ABDOMEN PELVIS FINDINGS Hepatobiliary: Vague hypodensity along the falciform ligament likely focal fatty infiltration. No gallstones, gallbladder wall thickening, or pericholecystic fluid. No biliary dilatation. Pancreas: No focal lesion. Normal pancreatic contour. No surrounding inflammatory changes. No main pancreatic ductal dilatation. Spleen: Normal in size. Slightly more conspicuous splenic hypodensities measuring of 2 cm (2:53). Adrenals/Urinary Tract: Stable left adrenal gland nodules measuring of to 2.4 x 1.2 cm. Stable right adrenal gland nodules measuring up to 4.6 x 1.6 cm. Bilateral kidneys enhance symmetrically. No hydronephrosis. No hydroureter. The urinary bladder is unremarkable. Stomach/Bowel: Stomach is within normal limits. No evidence of bowel wall thickening or dilatation. Appendix appears normal. Vascular/Lymphatic: No abdominal aorta or iliac aneurysm. Mild atherosclerotic plaque of the aorta and its branches. No abdominal, pelvic, or inguinal lymphadenopathy. Reproductive: Uterus and bilateral adnexa are unremarkable. Other: No intraperitoneal free fluid. No intraperitoneal free gas. No organized fluid collection. Musculoskeletal: No abdominal wall hernia or abnormality. Chronic diffuse axial and appendicular sclerotic osseous lesions. No acute displaced fracture. Six lumbar vertebral bodies with sacralization of the  L6 level. Grade 1 anterolisthesis L4 on L5. Mild retrolisthesis of L1 on L2. Bilateral intramedullary nail fixation of the femurs. Persistent periprosthetic lucency along the right femoral shaft surgical hardware. IMPRESSION: 1. Multifocal right lung pneumonia versus aspiration pneumonia. Underlying malignancy not fully excluded. 2. Stable left lower lobe 8 x 7 mm subpleural nodule. 3. Stable indeterminate bilateral adrenal gland nodules. 4. Stable indeterminate splenic hypodensities. 5. Chronic diffuse axial and appendicular sclerotic osseous metastases. Electronically Signed   By: Tish Frederickson M.D.   On:  07/05/2023 02:23   CT HEAD WO CONTRAST ( )  Result Date: 07/05/2023 CLINICAL DATA:  Mental status change, unknown cause Pt discharged yesterday after rod placement in L leg - pt altered since d/c yesterday. EXAM: CT HEAD WITHOUT CONTRAST TECHNIQUE: Contiguous axial images were obtained from the base of the skull through the vertex without intravenous contrast. RADIATION DOSE REDUCTION: This exam was performed according to the departmental dose-optimization program which includes automated exposure control, adjustment of the mA and/or kV according to patient size and/or use of iterative reconstruction technique. COMPARISON:  CT head 09/16/2022 FINDINGS: Brain: No evidence of large-territorial acute infarction. No definite parenchymal hemorrhage. No mass lesion. No definite extra-axial collection. A 3 mm hyperdensity in the region of the posterior horn of the right lateral ventricle. Question vague 4 mm hyperdensity versus artifact within the left frontal lobe (3:14). No mass effect or midline shift. No hydrocephalus. Basilar cisterns are patent. Vascular: No hyperdense vessel. Skull: No acute fracture or focal lesion. Sinuses/Orbits: Paranasal sinuses and mastoid air cells are clear. The orbits are unremarkable. Other: None. IMPRESSION: 1. A 3 mm hyperdensity in the region of the posterior horn of the  right lateral ventricle. Indeterminate etiology with hemorrhage not excluded. Recommend follow-up CT in 4-6 hours to evaluate for stability. 2. Question vague 4 mm hyperdensity versus artifact within the left frontal lobe. Recommended attention on follow-up Electronically Signed   By: Tish Frederickson M.D.   On: 07/05/2023 02:01    EKG: Independently reviewed.  Sinus rhythm, no acute ST changes.  Assessment/Plan Principal Problem:   Sepsis due to pneumonia [facility]) Active Problems:   Sepsis (HCC)  (please populate well all problems here in Problem List. (For example, if patient is on BP meds at home and you resume or decide to hold them, it is a problem that needs to be her. Same for CAD, COPD, HLD and so on)  Sepsis -Evidenced by tachycardia, new onset fever, elevated lactic acid level, with symptoms signs on end organ damage of acute metabolic encephalopathy.  Source of infection likely right lower lobe multifocal pneumonia, aspiration pneumonia suspected. -UA showed no UTI, surgical site clean no signs of infection, de-escalate antibiotics to Unasyn.  Will also cover atypical pneumonia for now, and will check atypical study -Appears to be euvolemic, hold off further IV boluses. -Aspiration precaution, HOB 30 degree, speech evaluation  Acute metabolic encephalopathy New onset of brain metastasis -Etiology likely secondary to sepsis -Other DDx.  Patient does have frequent episode of absent-minded and staring during my conversation.  Initial CT scan was suspicious for right posterior horn hemorrhage, repeat CT head showed patient has significant bilateral metastatic lesions which is new compared to previous brain images.  New onset seizure suspected, but ordered a spot EEG.  Send message to neurology with intention to discuss about further image study and antiseizure treatment. -Seizure precaution  Acute on chronic normocytic anemia -No symptoms signs of active bleeding, recheck H&H this  afternoon -Iron study  Acquired adrenal insufficiency -Switch to stress dose of hydrocortisone 50 mg every 6 hours  HTN Sinus tachycardia, chronic -BP borderline low, change metoprolol to as needed Lopressor  Left-sided femur surgery -Stable, no concern about active infection -P.o. Dilaudid for pain control -Change prophylactic Eliquis to Lovenox for now  Metastatic non-small lung CA -On chemo and immunotherapy, both on hold for hip surgery.  DVT prophylaxis: Lovenox Code Status: Full code Family Communication: Husband over the phone Disposition Plan: Patient is sepsis with mentation changes, requiring inpatient  IV antibiotics as well as inpatient neurology workup to rule out new onset of seizure, expect  Consults called: Sent neurology message Admission status: Tele admit  Emeline General MD [facility] Pager [phone]  07/05/2023, 8:01 AM

## 2023-07-05 NOTE — Progress Notes (Signed)
SLP Cancellation Note  Patient Details Name: LIZMARI TOOMEY MRN: [id number] DOB: 1966/04/26   Cancelled treatment:       Reason Eval/Treat Not Completed: Patient at procedure or test/unavailable (chart reviewed; consulted Tech in room, NSG)   EEG test being initiated upon coming to room. ST services will f/u this afternoon time permitting, or first thing in the morning. Met briefly w/ husband who agreed. NSG updated, agreed.   Jerilynn Som, MS, CCC-SLP Speech Language Pathologist Rehab Services; [facility] [phone] (ascom) Misheel Gowans 07/05/2023, 12:35 PM

## 2023-07-05 NOTE — Telephone Encounter (Signed)
Called [employer] appeal department at [phone] and they said that the see the case was canceled so appeal did not need to be done. She said I would need to talk to the prior authorization department. She said the call reference number was [id number] and another number she gave me was [phone]. Was transfer to the prior authorization department and they state that it was cancel because the PA needs to go through the speciality pharmacy first since the medication is Entyvio. She transfer me to the pharmacy prior authorization department and he states that it was approved on 12/15/2022 till 12/15/2023 no authorization is needed at this time. Approval number [id number] Sent mychart message to patient informing patient of this information

## 2023-07-05 NOTE — Progress Notes (Signed)
PHARMACY -  BRIEF ANTIBIOTIC NOTE   Pharmacy has received consult(s) for Vanc, Cefepime from an ED provider.  The patient's profile has been reviewed for ht/wt/allergies/indication/available labs.    One time order(s) placed for Vancomycin 2 gm and Cefepime 2 gm   Further antibiotics/pharmacy consults should be ordered by admitting physician if indicated.                       Thank you, Antuan Limes D 07/05/2023  12:33 AM

## 2023-07-05 NOTE — ED Notes (Signed)
Pt ate about 10% of meal.

## 2023-07-05 NOTE — Progress Notes (Signed)
PHARMACIST - PHYSICIAN COMMUNICATION   CONCERNING: Hydrocortisone IV    Current order: Hydrocortisone IV 50mg  every 6 hours     DESCRIPTION: Per [place] Protocol:   IV hydrocortisone will be converted to either a q8h or q12h frequency with the same total daily dose (TDD). Ordered Dose:   1 to 200 mg TDD; convert to: TDD div q12h. Ordered Dose: 201 to 300 mg TDD; convert to: TDD div q8h. Ordered Dose: >300 mg TDD; DAW.   Order has been adjusted to: Methylprednisolone IV 100mg  every 12 hours   Gardner Candle , PharmD, BCPS Clinical Pharmacist  07/05/2023 7:30 AM

## 2023-07-05 NOTE — ED Notes (Signed)
Pt ate about 10% of dinner

## 2023-07-05 NOTE — ED Notes (Signed)
This RN came out of another pt's room and found pt sitting in hallway by nurses station. Pt said she was lost and had to make a phone call. This RN assisted pt back to bed, replaced heart monitor. Pt family soon returned to room to sit with pt.

## 2023-07-05 NOTE — Progress Notes (Signed)
CODE SEPSIS - PHARMACY COMMUNICATION  **Broad Spectrum Antibiotics should be administered within 1 hour of Sepsis diagnosis**  Time Code Sepsis Called/Page Received:  10/22 @ 2322   Antibiotics Ordered: cefepime, vanc, metronidazole   Time of 1st antibiotic administration: cefepime 2 gm IV X 1 on 10/22 @ 2344   Additional action taken by pharmacy:   If necessary, Name of Provider/Nurse Contacted:     Trammell Bowden D ,PharmD Clinical Pharmacist  07/05/2023  12:34 AM

## 2023-07-05 NOTE — ED Notes (Signed)
Patient transported to CT 

## 2023-07-05 NOTE — Consult Note (Signed)
NEUROLOGY CONSULTATION NOTE   Date of service: July 06, 2023 Patient Name: Gina Sanchez MRN:  [id number] DOB:  27-Aug-1966 Reason for consult: new brain mets on imaging, staring spells Requesting physician: Dr. 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  Patient underwent surgery on a pathologic lytic bone lesion in her left hip on October 17.  She did well and is 24 hours after the surgery was discharged home.  Over the next few days she had poor oral intake and was just drinking Ensure but not taking actual meals.  The night prior to admission patient became increasingly confused and warm to the touch.  Her mental status waxes and wanes and she has discrete episodes of staring after which she does not recall conversation.  On admission she was found to have pneumonia and patient was started on vanc and cefepime.  She had a head CT that showed at least 3 subcentimeter hyperdense foci in the bilateral brain parenchyma favored to be brain metastases given the patient's history.  She was also noted to have left frontal bone sclerotic skull mets new since CT in January.  Brain MRI showed innumerable subcentimeter brain lesions which are new since most recent MRI brain in 2022.  The majority of these are seen in the gray-white junction which is concerning for brain metastases.  The postcontrast images are motion degraded.  CNS imaging was personally reviewed and I agree with radiology interpretation.   ROS   Per HPI: all other systems reviewed and are negative  Past History   I have reviewed the following:  Past Medical History:  Diagnosis Date   Abnormal Pap smear of cervix    Anxiety    C. 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   Chronic diarrhea    Closed fracture of distal end of left radius 03/23/2021   Colon polyp    Depression    Diverticulosis    Essential hypertension    Femur fracture, right (HCC) 09/14/2020   GERD (gastroesophageal reflux disease)    Hyperlipidemia    Leukocytosis    Lumbar radiculopathy    Lung cancer (HCC)    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   Metastatic cancer (HCC) 10/2020   lung, bone, lymph node, adrenal   Palpitations    Precordial chest pain    Wrist fracture 03/2021   left   Past Surgical History:  Procedure Laterality Date   BREAST BIOPSY Right 2017   benign   CERVICAL BIOPSY  W/ LOOP ELECTRODE EXCISION     COLONOSCOPY  06/11/2022   Procedure: COLONOSCOPY;  Surgeon: Toney Reil, MD;  Location: [facility] ENDOSCOPY;  Service: Gastroenterology;;   COLONOSCOPY WITH PROPOFOL N/A 07/03/2015   Procedure: COLONOSCOPY WITH PROPOFOL;  Surgeon: Wallace Cullens, MD;  Location: [facility] ENDOSCOPY;  Service: Gastroenterology;  Laterality: N/A;   COLONOSCOPY WITH PROPOFOL N/A 01/06/2022   Procedure: COLONOSCOPY WITH PROPOFOL;  Surgeon: Toney Reil, MD;  Location: [facility] ENDOSCOPY;  Service: Gastroenterology;  Laterality: N/A;   ESOPHAGOGASTRODUODENOSCOPY (EGD) WITH PROPOFOL N/A 07/03/2015   Procedure: ESOPHAGOGASTRODUODENOSCOPY (EGD) WITH PROPOFOL;  Surgeon: Wallace Cullens,  MD;  Location: [facility] ENDOSCOPY;  Service: Gastroenterology;  Laterality: N/A;   FEMUR IM NAIL Left 06/30/2023   Procedure: Left prophylactic intramedullary nailing of impending left subtrochanteric femur fracture;  Surgeon: Christena Flake, MD;  Location: [facility] ORS;  Service: Orthopedics;  Laterality: Left;   FLEXIBLE SIGMOIDOSCOPY N/A 03/12/2022   Procedure: FLEXIBLE SIGMOIDOSCOPY;  Surgeon: Toney Reil, MD;  Location: [facility] ENDOSCOPY;  Service: Gastroenterology;  Laterality: N/A;   FLEXIBLE SIGMOIDOSCOPY N/A 08/17/2022   Procedure: FLEXIBLE SIGMOIDOSCOPY;  Surgeon: Toney Reil, MD;  Location: [facility] ENDOSCOPY;  Service: Gastroenterology;  Laterality: N/A;   INTRAMEDULLARY (IM) NAIL INTERTROCHANTERIC Right 09/15/2020   Procedure: INTRAMEDULLARY (IM) NAIL INTERTROCHANTRIC;  Surgeon: Christena Flake, MD;  Location: [facility] ORS;  Service: Orthopedics;  Laterality: Right;   IR CV LINE INJECTION  07/14/2022   IR IMAGING GUIDED PORT INSERTION  11/28/2020   LEFT HEART CATH AND CORONARY ANGIOGRAPHY N/A 03/28/2017   Procedure: Left Heart Cath and Coronary Angiography;  Surgeon: Runell Gess, MD;  Location: [facility] INVASIVE CV LAB;  Service: Cardiovascular;  Laterality: N/A;   ORIF WRIST FRACTURE Left 03/31/2021   Procedure: OPEN REDUCTION INTERNAL FIXATION (ORIF) LEFT DISTAL RADIUS FRACTURE.;  Surgeon: Christena Flake, MD;  Location: [facility] ORS;  Service: Orthopedics;  Laterality: Left;   Family History  Problem Relation Age of Onset   Diabetes Mother        alive @ 28   Goiter Mother    Heart disease Father        died of MI @ 12   Osteoporosis Maternal Grandmother    Colon cancer Maternal Grandfather    Breast cancer Neg Hx    Ovarian cancer Neg Hx    Social History   Socioeconomic History   Marital status: Married    Spouse name: Gene   Number of children: 2   Years of education: Not on file   Highest education level: Not on file  Occupational History   Not on file  Tobacco Use   Smoking status: Former    Current packs/day: 0.00    Average packs/day: 1 pack/day for 30.0 years (30.0 ttl pk-yrs)    Types: Cigarettes    Start date: 09/17/1990    Quit date: 09/17/2020    Years since quitting: 2.8   Smokeless tobacco: Never  Vaping Use   Vaping status: Never Used  Substance and Sexual Activity   Alcohol use: Not Currently    Alcohol/week: 1.0 standard drink of alcohol    Types: 1 Standard drinks or equivalent per week    Comment: beer occ.   Drug use: No   Sexual activity: Yes    Birth control/protection: Post-menopausal  Other Topics Concern   Not on  file  Social History Narrative   Lives in [place] with her husband.  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   The labs and vital signs and weight was done in infusion   Social Determinants of Health   Financial Resource Strain: Not on file  Food Insecurity: No Food Insecurity (06/30/2023)   Hunger Vital Sign    Worried About Running Out of Food in the Last Year: Never true    Ran Out of Food in the Last Year: Never true  Transportation Needs: No Transportation Needs (06/30/2023)   PRAPARE - Administrator, Civil Service (Medical): No    Lack of Transportation (Non-Medical): No  Physical Activity: Not on file  Stress: Not on file  Social Connections: Not on file   Allergies  Allergen Reactions   Factive [Gemifloxacin] Rash    Medications   (Not in a hospital admission)     Current Facility-Administered Medications:    acetaminophen (TYLENOL) tablet 500 mg, 500 mg, Oral, Q6H PRN, Mikey College T, MD, 500 mg at 07/05/23 2158   ALPRAZolam Prudy Feeler) tablet 0.5 mg, 0.5 mg, Oral, Daily PRN, Chipper Herb, Ping T, MD, 0.5 mg at 07/05/23 2159   Ampicillin-Sulbactam (UNASYN) 3 g in sodium chloride 0.9 % 100 mL IVPB, 3 g, Intravenous, Q6H, Hunt, Madison H, RPH, Stopped at 07/06/23 0539   azithromycin (ZITHROMAX) tablet 500 mg, 500 mg, Oral, Daily, Chipper Herb, Ping T, MD, 500 mg at 07/05/23 1610   citalopram (CELEXA) tablet 10 mg, 10 mg, Oral, QHS, Mikey College T, MD, 10 mg at 07/05/23 2154   enoxaparin (LOVENOX) injection 40 mg, 40 mg, Subcutaneous, Q24H, Mikey College T, MD, 40 mg at 07/05/23 0906   fentaNYL (DURAGESIC) 25 MCG/HR 1 patch, 1 patch, Transdermal, Q72H, Mikey College T, MD, 1 patch at 07/05/23 0900   guaiFENesin (MUCINEX) 12 hr tablet 1,200 mg, 1,200 mg, Oral, BID, Mikey College T, MD, 1,200 mg at 07/05/23 2154   hydrocortisone sodium succinate (SOLU-CORTEF) 100 MG injection 100 mg, 100 mg, Intravenous, Q12H, Mikey College T, MD, 100 mg at  07/05/23 2349   HYDROmorphone (DILAUDID) tablet 2-4 mg, 2-4 mg, Oral, Q4H PRN, Mikey College T, MD, 4 mg at 07/05/23 1242   [COMPLETED] levETIRAcetam (KEPPRA) IVPB 1500 mg/ 100 mL premix, 1,500 mg, Intravenous, Once, Stopped at 07/05/23 1815 **FOLLOWED BY** levETIRAcetam (KEPPRA) tablet 500 mg, 500 mg, Oral, BID, Bing Neighbors M, MD, 500 mg at 07/05/23 2154   LORazepam (ATIVAN) injection 0.5 mg, 0.5 mg, Intravenous, Q6H PRN, Mikey College T, MD, 0.5 mg at 07/05/23 1037   metoprolol tartrate (LOPRESSOR) injection 2.5 mg, 2.5 mg, Intravenous, Q4H PRN, Mikey College T, MD, 2.5 mg at 07/05/23 2052   ondansetron (ZOFRAN) injection 4 mg, 4 mg, Intravenous, Q6H PRN, Emeline General, MD  Current Outpatient Medications:    acetaminophen (TYLENOL) 500 MG tablet, Take 500 mg by mouth every 6 (six) hours as needed., Disp: , Rfl:    ALPRAZolam (XANAX) 0.5 MG tablet, TAKE 1 TABLET BY MOUTH DAILY AS NEEDED FOR ANXIETY., Disp: 30 tablet, Rfl: 0   citalopram (CELEXA) 10 MG tablet, Take 1 tablet (10 mg total) by mouth daily. (Patient taking differently: Take 10 mg by mouth at bedtime.), Disp: 30 tablet, Rfl: 3   fentaNYL (DURAGESIC) 25 MCG/HR, Place 1 patch onto the skin every 3 (three) days., Disp: 10 patch, Rfl: 0   HYDROcodone bit-homatropine (HYCODAN) 5-1.5 MG/5ML syrup, Take 5 mLs by mouth every 6 (six) hours as needed for cough., Disp: 240 mL, Rfl: 0   hydrocortisone (CORTEF) 10 MG tablet, TAKE 1 TABLET BY MOUTH NIGHTLY, Disp: 30 tablet, Rfl: 3   hydrocortisone (CORTEF) 20 MG tablet, TAKE 1 TABLET BY MOUTH EVERY MORNING, Disp: 30 tablet, Rfl: 3   HYDROmorphone (DILAUDID) 2 MG tablet, Take 1-2 tablets (2-4 mg total) by mouth every 4 (four) hours as needed for severe pain., Disp: 120 tablet, Rfl: 0   lidocaine (LIDODERM) 5 %, Place 1 patch onto the skin daily. Remove & Discard patch within 12 hours or as directed by MD, Disp: , Rfl:    metoprolol succinate (TOPROL-XL) 25 MG 24 hr tablet, Take 1 tablet (25 mg total) by  mouth daily., Disp: 90 tablet,  Rfl: 0   OLANZapine (ZYPREXA) 5 MG tablet, Take 1 tablet (5 mg total) by mouth at bedtime., Disp: 30 tablet, Rfl: 1   ondansetron (ZOFRAN) 4 MG tablet, TAKE 1 TABLET BY MOUTH EVERY 8 HOURS AS NEEDED FOR NAUSEA AND VOMITING (Patient taking differently: Take 4 mg by mouth every 8 (eight) hours as needed for nausea or vomiting. TAKE 1 TABLET BY MOUTH EVERY 8 HOURS AS NEEDED FOR NAUSEA AND VOMITING), Disp: 20 tablet, Rfl: 1   prochlorperazine (COMPAZINE) 10 MG tablet, Take 1 tablet (10 mg total) by mouth every 6 (six) hours as needed for nausea or vomiting., Disp: 30 tablet, Rfl: 1   apixaban (ELIQUIS) 2.5 MG TABS tablet, Take 1 tablet (2.5 mg total) by mouth 2 (two) times daily for 14 days. (Patient not taking: Reported on 07/05/2023), Disp: 28 tablet, Rfl: 0   Vedolizumab (ENTYVIO) 108 MG/0.68ML SOPN, Inject 1 Pen into the skin every 14 (fourteen) days. (Patient taking differently: Inject 1 Pen into the skin every 30 (thirty) days.), Disp: 1.36 mL, Rfl: 12  Facility-Administered Medications Ordered in Other Encounters:    sodium chloride flush (NS) 0.9 % injection 10 mL, 10 mL, Intravenous, PRN, Creig Hines, MD, 10 mL at 03/09/21 0906  Vitals   Vitals:   07/06/23 0000 07/06/23 0238 07/06/23 0300 07/06/23 0332  BP: 123/87 99/74 105/74   Pulse: (!) 108 (!) 106 99   Resp: 20 20 17    Temp:    97.6 F (36.4 C)  TempSrc:    Oral  SpO2: 97% 95% 90%   Weight:      Height:         Body mass index is 28.96 kg/m.  Physical Exam   Gen: patient lying in bed, NAD CV: extremities appear well-perfused Resp: normal WOB  Neurologic exam MS: alert, oriented x4, follows commands Speech: no dysarthria, no aphasia CN: PERRL, VFF, EOMI, sensation intact, face symmetric, hearing intact to voice Motor: 4+/5 BUE, 4-/5 RLE, LLE pain-limited after recent hip surgery Sensory: SILT Reflexes: 2+ symm with toes down bilat Coordination: FNF intact bilat Gait:  deferred   Labs   CBC:  Recent Labs  Lab 07/04/23 2310 07/05/23 0416 07/06/23 0328  WBC 11.6* 10.0 9.5  NEUTROABS 7.5 7.6  --   HGB 10.2* 8.6* 8.7*  HCT 31.6* 27.0* 27.8*  MCV 86.1 87.7 87.7  PLT 278 222 223    Basic Metabolic Panel:  Lab Results  Component Value Date   NA 134 (L) 07/06/2023   K 3.7 07/06/2023   CO2 22 07/06/2023   GLUCOSE 168 (H) 07/06/2023   BUN 11 07/06/2023   CREATININE 0.54 07/06/2023   CALCIUM 8.2 (L) 07/06/2023   GFRNONAA >60 07/06/2023   GFRAA 105 03/14/2018   Lipid Panel:  Lab Results  Component Value Date   LDLCALC UNABLE TO CALCULATE IF TRIGLYCERIDE OVER 400 mg/dL 16/06/9603   [id number]:  Lab Results  Component Value Date   HGBA1C 5.7 (H) 03/25/2017   Urine Drug Screen:     Component Value Date/Time   LABOPIA POSITIVE (A) 07/05/2023 0150   COCAINSCRNUR NONE DETECTED 07/05/2023 0150   LABBENZ POSITIVE (A) 07/05/2023 0150   AMPHETMU NONE DETECTED 07/05/2023 0150   THCU NONE DETECTED 07/05/2023 0150   LABBARB NONE DETECTED 07/05/2023 0150    Alcohol Level No results found for: "ETH"  MRI brain 1. Innumerable subcentimeter brain lesions which are new since 2022. Although most are difficult to identify on motion degraded post-contrast images, the  constellation of MRI findings is more suggestive of brain metastases than embolic infarcts or other differential considerations. And it is possible that most of the lesions are treated metastases, with no associated vasogenic edema or mass effect. A short interval repeat MRI in 2 months may be valuable.   2. Progressed skull metastases since 2022 but no destructive osseous lesion is identified.  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  Her brain MRI shows innumerable lesions at gray white junction new since most recent MRI in 2022 and concerning for brain metastases. Given this finding her staring spells most likely represent focal seizures with impaired awareness. I reviewed the brain MRI actual images with patient and her husband. Oncology has been consulted.  Recommendations   - rEEG - Keppra 2g IV load f/b 500mg  po bid - Patient counseled she should not drive x6 mos after last seizure - Oncology consulted for new brain mets - Ambulatory referral to neurology for outpatient f/u  Will f/u on EEG and otherwise be available prn for questions going forward ______________________________________________________________________   Thank you for the opportunity to take part in the care of this patient. If you have any further questions, please contact the neurology consultation attending.  Signed,  Bing Neighbors, MD [facility] [phone]  If 7pm- 7am, please page neurology on call as listed in AMION.  **Any copied and pasted documentation in this note was written by me in another application not billed for and pasted by me into this document.

## 2023-07-05 NOTE — Progress Notes (Signed)
Eeg done 

## 2023-07-06 ENCOUNTER — Encounter: Payer: Self-pay | Admitting: Internal Medicine

## 2023-07-06 DIAGNOSIS — A419 Sepsis, unspecified organism: Secondary | ICD-10-CM | POA: Diagnosis not present

## 2023-07-06 DIAGNOSIS — R569 Unspecified convulsions: Secondary | ICD-10-CM | POA: Diagnosis not present

## 2023-07-06 DIAGNOSIS — J189 Pneumonia, unspecified organism: Secondary | ICD-10-CM | POA: Diagnosis not present

## 2023-07-06 LAB — CBC
HCT: 27.8 % — ABNORMAL LOW (ref 36.0–46.0)
Hemoglobin: 8.7 g/dL — ABNORMAL LOW (ref 12.0–15.0)
MCH: 27.4 pg (ref 26.0–34.0)
MCHC: 31.3 g/dL (ref 30.0–36.0)
MCV: 87.7 fL (ref 80.0–100.0)
Platelets: 223 10*3/uL (ref 150–400)
RBC: 3.17 MIL/uL — ABNORMAL LOW (ref 3.87–5.11)
RDW: 15.8 % — ABNORMAL HIGH (ref 11.5–15.5)
WBC: 9.5 10*3/uL (ref 4.0–10.5)
nRBC: 0 % (ref 0.0–0.2)

## 2023-07-06 LAB — MYCOPLASMA PNEUMONIAE ANTIBODY, IGM: Mycoplasma pneumo IgM: <770 U/mL (ref 0–769)

## 2023-07-06 LAB — LEGIONELLA PNEUMOPHILA SEROGP 1 UR AG: L. pneumophila Serogp 1 Ur Ag: NEGATIVE

## 2023-07-06 LAB — BASIC METABOLIC PANEL
Anion gap: 10 (ref 5–15)
BUN: 11 mg/dL (ref 6–20)
CO2: 22 mmol/L (ref 22–32)
Calcium: 8.2 mg/dL — ABNORMAL LOW (ref 8.9–10.3)
Chloride: 102 mmol/L (ref 98–111)
Creatinine, Ser: 0.54 mg/dL (ref 0.44–1.00)
GFR, Estimated: >60 mL/min (ref >60)
Glucose, Bld: 168 mg/dL — ABNORMAL HIGH (ref 70–99)
Potassium: 3.7 mmol/L (ref 3.5–5.1)
Sodium: 134 mmol/L — ABNORMAL LOW (ref 135–145)

## 2023-07-06 MED ORDER — CHLORHEXIDINE GLUCONATE CLOTH 2 % EX PADS
6.0000 | MEDICATED_PAD | Freq: Every day | CUTANEOUS | Status: DC
  Administered 2023-07-06 – 2023-07-08 (×3): 6 via TOPICAL

## 2023-07-06 MED ORDER — ACETAMINOPHEN 500 MG PO TABS
500.0000 mg | ORAL_TABLET | Freq: Four times a day (QID) | ORAL | Status: DC | PRN
  Administered 2023-07-06 – 2023-07-07 (×3): 500 mg via ORAL
  Filled 2023-07-06 (×3): qty 1

## 2023-07-06 MED ORDER — SODIUM CHLORIDE 0.9 % IV SOLN
1.0000 g | INTRAVENOUS | Status: DC
  Administered 2023-07-06 – 2023-07-08 (×3): 1 g via INTRAVENOUS
  Filled 2023-07-06 (×3): qty 10

## 2023-07-06 NOTE — ED Notes (Signed)
Pt unable to have dilaudid at this time. Tylenol given for pain

## 2023-07-06 NOTE — Progress Notes (Signed)
PROGRESS NOTE    Gina Sanchez  QQV:[id number] DOB: 05/12/1966 DOA: 07/04/2023 PCP: Armando Gang, FNP  [id number]  LOS: 1 day   Brief hospital course:   Assessment & Plan: Gina Sanchez is a 57 y.o. 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    Severe Sepsis -Evidenced by tachycardia, fever 102.2, elevated lactic acid level.  Source of infection likely multifocal pneumonia.  Multifocal PNA --thought to be due to aspiration, abx narrowed to Unasyn on admission. --SLP eval found normal swallow function.  Given Right upper lobe and lower lobe patchy airspace opacities, less likely to be due to aspiration.   Plan: --switch abx to ceftriaxone --cont azithromycin   Acute metabolic encephalopathy likely due to seizures New onset of brain metastasis --CT head showed patient has significant bilateral metastatic lesions which is new compared to previous brain images.  New onset seizure suspected.  Neuro consulted. --loaded on IV keppra Plan: --EEG --cont Keppra - Ambulatory referral to neurology for outpatient f/u    Acute on chronic normocytic anemia -No symptoms signs of active bleeding, recheck H&H this afternoon -Iron study   Acquired adrenal insufficiency --started on stress dose steroid  --cont Solu-cortef 100 mg q12h   HTN -BP borderline low,   Recent Left-sided femur surgery -Stable, no concern about active infection -P.o. Dilaudid for pain control   Metastatic non-small lung CA -On chemo and immunotherapy, both on hold for hip surgery. --oncology consult, Dr. Smith Robert to see   DVT prophylaxis: Lovenox SQ Code Status: Full code  Family Communication: Gina Sanchez updated at bedside  today Level of care: Telemetry Medical Dispo:   The patient is from: home Anticipated d/c is to: home Anticipated d/c date is: 2-3 days   Subjective and Interval History:  Gina Sanchez reported cough at night time, but that's chronic.  No dysuria.  No diarrhea.     Objective: Vitals:   07/06/23 1300 07/06/23 1400 07/06/23 1423 07/06/23 1720  BP: (!) 127/91 (!) 113/90 117/88 116/89  Pulse: (!) 106 (!) 110 (!) 110 (!) 103  Resp: (!) 22 18 18 20   Temp:  97.6 F (36.4 C) 98.2 F (36.8 C) (!) 97.2 F (36.2 C)  TempSrc:  Oral    SpO2: 98% 96% 97% 93%  Weight:      Height:        Intake/Output Summary (Last 24 hours) at 07/06/2023 1817 Last data filed at 07/06/2023 0539 Gross per 24 hour  Intake 200 ml  Output --  Net 200 ml   Filed Weights   07/04/23 2242  Weight: 81.4 kg    Examination:   Constitutional: NAD, AAOx3 HEENT: conjunctivae and lids normal, EOMI CV: No cyanosis.   RESP: normal respiratory effort, on RA Extremities: TED hose in BLE SKIN: warm, dry Neuro: II - XII grossly intact.   Psych: Normal mood and affect.     Data Reviewed: I have personally reviewed labs and imaging studies  Time spent: 50 minutes  Darlin Priestly, MD [facility] If 7PM-7AM, please contact night-coverage 07/06/2023, 6:17 PM

## 2023-07-06 NOTE — ED Notes (Signed)
Pt transported via stretcher to unit at this time. Called unit to make aware of patient's soon arrival. Pt stable at time of departure.

## 2023-07-06 NOTE — Plan of Care (Signed)

## 2023-07-06 NOTE — Evaluation (Signed)
Clinical/Bedside Swallow Evaluation Patient Details  Name: Gina Sanchez MRN: [id number] Date of Birth: [birth year]  Today's Date: 07/06/2023 Time: SLP Start Time (ACUTE ONLY): 0920 SLP Stop Time (ACUTE ONLY): 1015 SLP Time Calculation (min) (ACUTE ONLY): 55 min  Past Medical History:  Past Medical History:  Diagnosis Date   Abnormal Pap smear of cervix    Anxiety    C. 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   Metastatic cancer (HCC) 10/2020   lung, bone, lymph node, adrenal   Palpitations    Precordial chest pain    Wrist fracture 03/2021   left   Past Surgical History:  Past Surgical History:  Procedure Laterality Date   BREAST BIOPSY Right 2017   benign   CERVICAL BIOPSY  W/ LOOP ELECTRODE EXCISION     COLONOSCOPY  06/11/2022   Procedure: COLONOSCOPY;  Surgeon: Toney Reil, MD;  Location: [facility] ENDOSCOPY;  Service: Gastroenterology;;   COLONOSCOPY WITH PROPOFOL N/A 07/03/2015   Procedure: COLONOSCOPY WITH PROPOFOL;  Surgeon: Wallace Cullens, MD;  Location: [facility] ENDOSCOPY;  Service: Gastroenterology;  Laterality: N/A;   COLONOSCOPY WITH PROPOFOL N/A 01/06/2022   Procedure: COLONOSCOPY WITH PROPOFOL;  Surgeon: Toney Reil, MD;  Location: [facility] ENDOSCOPY;  Service: Gastroenterology;  Laterality: N/A;   ESOPHAGOGASTRODUODENOSCOPY (EGD) WITH PROPOFOL N/A 07/03/2015   Procedure: ESOPHAGOGASTRODUODENOSCOPY (EGD) WITH PROPOFOL;  Surgeon: Wallace Cullens, MD;  Location: [facility] ENDOSCOPY;  Service: Gastroenterology;  Laterality: N/A;   FEMUR IM NAIL Left 06/30/2023   Procedure: Left prophylactic  intramedullary nailing of impending left subtrochanteric femur fracture;  Surgeon: Christena Flake, MD;  Location: [facility] ORS;  Service: Orthopedics;  Laterality: Left;   FLEXIBLE SIGMOIDOSCOPY N/A 03/12/2022   Procedure: FLEXIBLE SIGMOIDOSCOPY;  Surgeon: Toney Reil, MD;  Location: [facility] ENDOSCOPY;  Service: Gastroenterology;  Laterality: N/A;   FLEXIBLE SIGMOIDOSCOPY N/A 08/17/2022   Procedure: FLEXIBLE SIGMOIDOSCOPY;  Surgeon: Toney Reil, MD;  Location: [facility] ENDOSCOPY;  Service: Gastroenterology;  Laterality: N/A;   INTRAMEDULLARY (IM) NAIL INTERTROCHANTERIC Right 09/15/2020   Procedure: INTRAMEDULLARY (IM) NAIL INTERTROCHANTRIC;  Surgeon: Christena Flake, MD;  Location: [facility] ORS;  Service: Orthopedics;  Laterality: Right;   IR CV LINE INJECTION  07/14/2022   IR IMAGING GUIDED PORT INSERTION  11/28/2020   LEFT HEART CATH AND CORONARY ANGIOGRAPHY N/A 03/28/2017   Procedure: Left Heart Cath and Coronary Angiography;  Surgeon: Runell Gess, MD;  Location: [facility] INVASIVE CV LAB;  Service: Cardiovascular;  Laterality: N/A;   ORIF WRIST FRACTURE Left 03/31/2021   Procedure: OPEN REDUCTION INTERNAL FIXATION (ORIF) LEFT DISTAL RADIUS FRACTURE.;  Surgeon: Christena Flake, MD;  Location: [facility] ORS;  Service: Orthopedics;  Laterality: Left;   HPI:  Pt is a 57 year old female with a history of metastatic lung cancer who developed increased left thigh pain over the past month. X-rays have demonstrated the presence of multiple blastic lesions involving the intertrochanteric, subtrochanteric, and femoral shaft regions of the left femur. The patient presents at this time to Orthopedic Surgery for prophylactic intramedullary nailing of  the left femur.  Per MD notes, she did well initially post surgery and was discharged home.  Over the next few days, she had poor oral intake and was just drinking Ensure but not taking actual meals.  The night prior to admission, patient became increasingly confused,  hallucinating per Husband, and warm to the touch.  Her mental status has waxed and waned and she has discrete episodes of staring after which she does not recall conversation.  On admission she was found to have pneumonia and patient was started on vanc and cefepime.  She had a head CT that showed at least 3 subcentimeter hyperdense foci in the bilateral brain parenchyma favored to be brain metastases given the patient's history. She was also noted to have left frontal bone sclerotic skull mets new since CT in January.     MRI: Innumerable subcentimeter brain lesions which are new since 2022.  Although most are difficult to identify on motion degraded  post-contrast images, the constellation of MRI findings is more  suggestive of brain metastases than embolic infarcts or other  differential considerations.  And it is possible that most of the lesions are treated metastases, with no associated vasogenic edema or mass effect.  2. Progressed skull metastases since 2022 but no destructive osseous  lesion is identified.  Concern for brain metastasis per Neurology note.    Assessment / Plan / Recommendation  Clinical Impression   Pt seen for BSE this morning. Pt awake, verbal but seemed mildly anxiout. She did report Pain of "8/10" -- NSG informed. Pt followed instructions appropriately. Noted hoarse vocal quality - vocal cord dysfunction which Husband stated was Baseline. Pt finishing her breakfast meal. Coffee brought in by Husband.  On RA, afebrile. WBC WNL.  Pt appears to present w/ functional oropharyngeal phase swallowing w/ No oropharyngeal phase dysphagia noted, No neuromuscular deficits noted during po intake. Pt consumed po trials w/ No overt, clinical s/s of aspiration during po trials.  Pt appears at reduced risk for aspiration following general aspiration precautions. However, pt does have challenging factors that could impact her oropharyngeal swallowing to include Pain/discomfort(NSG aware),  deconditioning/weakness, mild Confusion at times, Chronic illness/Cancer progression w/ vocal cord involvement per report of Oncologist to pt/Husband. These factors can increase risk for aspiration, dysphagia as well as decreased oral intake overall.   During po trials, pt consumed all consistencies w/ no overt coughing, decline in vocal quality, or change in respiratory presentation during/post trials. O2 sats remained upper 90s. Oral phase appeared [facility] w/ timely bolus management, mastication, and control of bolus propulsion for A-P transfer for swallowing. Oral clearing achieved w/ all trial consistencies -- moistened, soft foods given.  OM Exam appeared [facility] w/ no overt unilateral weakness noted. Speech Clear but w/ reduced vocal quality and volume -- of note, when pt utilized the Head Turn R(toward weak cord reported), vocal quality and volume improved per pt/Husband and this SLP. Discussed use of this strategy and vocal hygiene for ongoing use. Pt fed self w/ setup support.   Recommend continue a fairly Regular consistency diet w/ well-Cut meats, moistened foods; Thin liquids -- carefully monitor straw use, and pt to Hold Cup when drinking. Recommend general aspiration precautions, reduce distractions including Talking when eating/drinking. Tray setup and support at meals as needed. Pills WHOLE in Puree for safer, easier swallowing IF any difficulty swallowing w/ Water w/ NSG.  Education given on Pills in Puree; food consistencies and easy to eat options; general aspiration precautions to pt  and Husband as well as vocal hygiene; handouts given. NSG to reconsult if any new needs arise. NSG updated, agreed. MD updated. Recommend Dietician and Palliative Care f/u for support. SLP Visit Diagnosis: Dysphagia, unspecified (R13.10)    Aspiration Risk   (reduced following general aspiration precautions)    Diet Recommendation   Thin;Age appropriate regular (cut, moist foods) = a fairly Regular consistency  diet w/ well-Cut meats, moistened foods; Thin liquids -- carefully monitor straw use, and pt to Hold Cup when drinking. Recommend general aspiration precautions, reduce distractions including Talking when eating/drinking. Tray setup and support at meals as needed.   Medication Administration: Whole meds with liquid (vs Whole in Puree if needed for ease of swallowing)    Other  Recommendations Recommended Consults:  (Dietician f/u; Palliative Care f/u) Oral Care Recommendations: Oral care BID;Oral care before and after PO;Patient independent with oral care (staff support)    Recommendations for follow up therapy are one component of a multi-disciplinary discharge planning process, led by the attending physician.  Recommendations may be updated based on patient status, additional functional criteria and insurance authorization.  Follow up Recommendations No SLP follow up      Assistance Recommended at Discharge  intermittent  Functional Status Assessment Patient has not had a recent decline in their functional status  Frequency and Duration  (n/a)   (n/a)       Prognosis Prognosis for improved oropharyngeal function: Good (for swallowing) Barriers to Reach Goals:  (none) Barriers/Prognosis Comment: generalized weakness w/ ongoing Ca issues      Swallow Study   General Date of Onset: 07/05/23 HPI: Pt is a 57 year old female with a history of metastatic lung cancer who developed increased left thigh pain over the past month. X-rays have demonstrated the presence of multiple blastic lesions involving the intertrochanteric, subtrochanteric, and femoral shaft regions of the left femur. The patient presents at this time to Orthopedic Surgery for prophylactic intramedullary nailing of the left femur.  Per MD notes, she did well initially post surgery and was discharged home.  Over the next few days, she had poor oral intake and was just drinking Ensure but not taking actual meals.  The night prior  to admission, patient became increasingly confused, hallucinating per Husband, and warm to the touch.  Her mental status has waxed and waned and she has discrete episodes of staring after which she does not recall conversation.  On admission she was found to have pneumonia and patient was started on vanc and cefepime.  She had a head CT that showed at least 3 subcentimeter hyperdense foci in the bilateral brain parenchyma favored to be brain metastases given the patient's history. She was also noted to have left frontal bone sclerotic skull mets new since CT in January.     MRI: Innumerable subcentimeter brain lesions which are new since 2022.  Although most are difficult to identify on motion degraded  post-contrast images, the constellation of MRI findings is more  suggestive of brain metastases than embolic infarcts or other  differential considerations.  And it is possible that most of the lesions are treated metastases, with no associated vasogenic edema or mass effect.  2. Progressed skull metastases since 2022 but no destructive osseous  lesion is identified.  Concern for brain metastasis per Neurology note. Type of Study: Bedside Swallow Evaluation Previous Swallow Assessment: none Diet Prior to this Study: Regular;Thin liquids (Level 0) Temperature Spikes Noted: No (wbc 9.5) Respiratory Status: Room air History of Recent Intubation:  No Behavior/Cognition: Alert;Cooperative;Pleasant mood;Distractible;Requires cueing (Pain - NSG aware) Oral Cavity Assessment: Within Functional Limits Oral Care Completed by SLP: Recent completion by staff Oral Cavity - Dentition: Poor condition (min) Vision: Functional for self-feeding Self-Feeding Abilities: Able to feed self;Needs assist;Needs set up (overall weakness) Patient Positioning: Upright in bed (needed positioning support) Baseline Vocal Quality: Hoarse;Low vocal intensity;Suspected CN X (Vagus) 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; baseline w/ Ca involvement/progression) Volitional Cough: Strong Volitional Swallow: Able to elicit    Oral/Motor/Sensory Function Overall Oral Motor/Sensory Function: Within functional limits   Ice Chips Ice chips: Not tested   Thin Liquid Thin Liquid: Within functional limits Presentation: Cup;Self Fed (10+ trials)    Nectar Thick Nectar Thick Liquid: Not tested   Honey Thick Honey Thick Liquid: Not tested   Puree Puree: Not tested   Solid     Solid: Within functional limits Presentation: Self Fed (2 trials) Other Comments: pancake         Jerilynn Som, MS, CCC-SLP Speech Language Pathologist Rehab Services; [facility] [phone] (ascom) Nasiya Pascual 07/06/2023,10:35 AM

## 2023-07-06 NOTE — ED Notes (Signed)
Pt assisted up to bedside commode to void.

## 2023-07-06 NOTE — Consult Note (Signed)
Hematology/Oncology Consult note [facility] [phone] [phone]  Patient Care Team: Armando Gang, FNP as PCP - General (Family Medicine) Glory Buff, RN as Oncology Nurse Navigator Carmina Miller, MD as Radiation Oncologist (Radiation Oncology) Vida Rigger, MD as Consulting Physician (Pulmonary Disease) Creig Hines, MD as Consulting Physician (Oncology)   Name of the patient: Gina Sanchez  [id number]  Feb 02, 1966    Reason for referral- ***   Referring physician- ***  Date of visit: @TODAY @   History of presenting illness- ***  ECOG PS- ***  Pain scale- ***   Review of systems- ROS  Allergies  Allergen Reactions   Factive [Gemifloxacin] Rash    Patient Active Problem List   Diagnosis Date Noted   Sepsis due to pneumonia (HCC) 07/05/2023   Sepsis (HCC) 07/05/2023   Pathologic fracture of part of femur (HCC) 06/30/2023   Cancer related pain 03/10/2023   Poor appetite 03/10/2023   Sinus tachycardia 02/10/2023   Secondary adenocarcinoma of lung (HCC)    Indeterminate colitis    Closed fracture of distal end of left radius 03/23/2021   Metastatic lung cancer (metastasis from lung to other site) (HCC) 10/19/2020   Goals of care, counseling/discussion 10/19/2020   Pathologic fracture    Mediastinal adenopathy    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   Chronic diarrhea    Closed fracture of distal end of left radius 03/23/2021   Colon polyp    Depression    Diverticulosis    Essential hypertension    Femur fracture, right (HCC) 09/14/2020   GERD (gastroesophageal reflux disease)    Hyperlipidemia    Leukocytosis    Lumbar radiculopathy    Lung cancer (HCC)    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   Metastatic cancer (HCC) 10/2020   lung, bone, lymph node, adrenal   Palpitations    Precordial chest pain    Wrist fracture 03/2021   left     Past Surgical History:  Procedure Laterality Date   BREAST BIOPSY Right 2017   benign   CERVICAL BIOPSY  W/ LOOP ELECTRODE EXCISION     COLONOSCOPY  06/11/2022   Procedure: COLONOSCOPY;  Surgeon: Toney Reil, MD;  Location: [facility] ENDOSCOPY;  Service: Gastroenterology;;   COLONOSCOPY WITH PROPOFOL N/A 07/03/2015   Procedure: COLONOSCOPY WITH PROPOFOL;  Surgeon: Wallace Cullens, MD;  Location: [facility] ENDOSCOPY;  Service: Gastroenterology;  Laterality: N/A;   COLONOSCOPY WITH PROPOFOL N/A 01/06/2022   Procedure: COLONOSCOPY WITH PROPOFOL;  Surgeon: Toney Reil, MD;  Location: [facility] ENDOSCOPY;  Service: Gastroenterology;  Laterality: N/A;   ESOPHAGOGASTRODUODENOSCOPY (EGD) WITH PROPOFOL N/A 07/03/2015   Procedure: ESOPHAGOGASTRODUODENOSCOPY (EGD) WITH PROPOFOL;  Surgeon: Wallace Cullens, MD;  Location: [facility] ENDOSCOPY;  Service: Gastroenterology;  Laterality: N/A;   FEMUR IM NAIL Left 06/30/2023   Procedure: Left prophylactic intramedullary nailing of impending left subtrochanteric femur fracture;  Surgeon: Christena Flake, MD;  Location: [facility] ORS;  Service: Orthopedics;  Laterality: Left;   FLEXIBLE SIGMOIDOSCOPY N/A 03/12/2022   Procedure: FLEXIBLE SIGMOIDOSCOPY;  Surgeon: Toney Reil, MD;  Location: [facility] ENDOSCOPY;  Service: Gastroenterology;  Laterality: N/A;   FLEXIBLE SIGMOIDOSCOPY N/A 08/17/2022   Procedure: FLEXIBLE SIGMOIDOSCOPY;  Surgeon: Toney Reil,  MD;  Location: [facility] ENDOSCOPY;  Service: Gastroenterology;  Laterality: N/A;   INTRAMEDULLARY (IM) NAIL INTERTROCHANTERIC Right 09/15/2020   Procedure: INTRAMEDULLARY (IM) NAIL INTERTROCHANTRIC;  Surgeon: Christena Flake, MD;  Location: [facility] ORS;  Service: Orthopedics;  Laterality: Right;   IR CV LINE INJECTION  07/14/2022   IR IMAGING GUIDED PORT INSERTION  11/28/2020   LEFT HEART CATH AND CORONARY ANGIOGRAPHY N/A 03/28/2017   Procedure: Left Heart Cath and Coronary Angiography;  Surgeon: Runell Gess, MD;  Location: [facility] INVASIVE CV LAB;  Service: Cardiovascular;  Laterality: N/A;   ORIF WRIST FRACTURE Left 03/31/2021   Procedure: OPEN REDUCTION INTERNAL FIXATION (ORIF) LEFT DISTAL RADIUS FRACTURE.;  Surgeon: Christena Flake, MD;  Location: [facility] ORS;  Service: Orthopedics;  Laterality: Left;    Social History   Socioeconomic History   Marital status: Married    Spouse name: Gene   Number of children: 2   Years of education: Not on file   Highest education level: Not on file  Occupational History   Not on file  Tobacco Use   Smoking status: Former    Current packs/day: 0.00    Average packs/day: 1 pack/day for 30.0 years (30.0 ttl pk-yrs)    Types: Cigarettes    Start date: 09/17/1990    Quit date: 09/17/2020    Years since quitting: 2.8   Smokeless tobacco: Never  Vaping Use   Vaping status: Never Used  Substance and Sexual Activity   Alcohol use: Not Currently    Alcohol/week: 1.0 standard drink of alcohol    Types: 1 Standard drinks or equivalent per week    Comment: beer occ.   Drug use: No   Sexual activity: Yes    Birth control/protection: Post-menopausal  Other Topics Concern   Not on file  Social History Narrative   Lives in [place] with her husband.  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   The labs and vital signs and weight was done in infusion   Social Determinants of Health   Financial Resource Strain: Not on file   Food Insecurity: No Food Insecurity (06/30/2023)   Hunger Vital Sign    Worried About Running Out of Food in the Last Year: Never true    Ran Out of Food in the Last Year: Never true  Transportation Needs: No Transportation Needs (06/30/2023)   PRAPARE - Administrator, Civil Service (Medical): No    Lack of Transportation (Non-Medical): No  Physical Activity: Not on file  Stress: Not on file  Social Connections: Not on file  Intimate Partner Violence: Not At Risk (06/30/2023)   Humiliation, Afraid, Rape, and Kick questionnaire    Fear of Current or Ex-Partner: No    Emotionally Abused: No    Physically Abused: No    Sexually Abused: No     Family History  Problem Relation Age of Onset   Diabetes Mother        alive @ 14   Goiter Mother    Heart disease Father        died of MI @ 58  Osteoporosis Maternal Grandmother    Colon cancer Maternal Grandfather    Breast cancer Neg Hx    Ovarian cancer Neg Hx      Current Facility-Administered Medications:    acetaminophen (TYLENOL) tablet 500 mg, 500 mg, Oral, Q6H PRN, Ronnald Ramp, RPH, 500 mg at 07/06/23 1204   ALPRAZolam (XANAX) tablet 0.5 mg, 0.5 mg, Oral, Daily PRN, Mikey College T, MD, 0.5 mg at 07/05/23 2159   Ampicillin-Sulbactam (UNASYN) 3 g in sodium chloride 0.9 % 100 mL IVPB, 3 g, Intravenous, Q6H, Hunt, Madison H, RPH, Stopped at 07/06/23 1053   azithromycin (ZITHROMAX) tablet 500 mg, 500 mg, Oral, Daily, Chipper Herb, Ping T, MD, 500 mg at 07/06/23 1010   Chlorhexidine Gluconate Cloth 2 % PADS 6 each, 6 each, Topical, Daily, Darlin Priestly, MD   citalopram (CELEXA) tablet 10 mg, 10 mg, Oral, QHS, Mikey College T, MD, 10 mg at 07/05/23 2154   enoxaparin (LOVENOX) injection 40 mg, 40 mg, Subcutaneous, Q24H, Mikey College T, MD, 40 mg at 07/06/23 1011   fentaNYL (DURAGESIC) 25 MCG/HR 1 patch, 1 patch, Transdermal, Q72H, Mikey College T, MD, 1 patch at 07/05/23 0900   guaiFENesin (MUCINEX) 12 hr tablet 1,200 mg, 1,200 mg,  Oral, BID, Mikey College T, MD, 1,200 mg at 07/06/23 1010   hydrocortisone sodium succinate (SOLU-CORTEF) 100 MG injection 100 mg, 100 mg, Intravenous, Q12H, Mikey College T, MD, 100 mg at 07/06/23 1011   HYDROmorphone (DILAUDID) tablet 2-4 mg, 2-4 mg, Oral, Q4H PRN, Mikey College T, MD, 2 mg at 07/06/23 1009   [COMPLETED] levETIRAcetam (KEPPRA) IVPB 1500 mg/ 100 mL premix, 1,500 mg, Intravenous, Once, Stopped at 07/05/23 1815 **FOLLOWED BY** levETIRAcetam (KEPPRA) tablet 500 mg, 500 mg, Oral, BID, Jefferson Fuel, MD, 500 mg at 07/06/23 1010   LORazepam (ATIVAN) injection 0.5 mg, 0.5 mg, Intravenous, Q6H PRN, Mikey College T, MD, 0.5 mg at 07/05/23 1037   metoprolol tartrate (LOPRESSOR) injection 2.5 mg, 2.5 mg, Intravenous, Q4H PRN, Mikey College T, MD, 2.5 mg at 07/05/23 2052   ondansetron (ZOFRAN) injection 4 mg, 4 mg, Intravenous, Q6H PRN, Emeline General, MD  Facility-Administered Medications Ordered in Other Encounters:    sodium chloride flush (NS) 0.9 % injection 10 mL, 10 mL, Intravenous, PRN, Creig Hines, MD, 10 mL at 03/09/21 0906   Physical exam:  Vitals:   07/06/23 1200 07/06/23 1300 07/06/23 1400 07/06/23 1423  BP: (!) 120/92 (!) 127/91 (!) 113/90 117/88  Pulse: (!) 101 (!) 106 (!) 110 (!) 110  Resp: 19 (!) 22 18 18   Temp:   97.6 F (36.4 C) 98.2 F (36.8 C)  TempSrc:   Oral   SpO2: 93% 98% 96% 97%  Weight:      Height:       Physical Exam        Latest Ref Rng & Units 07/06/2023    3:28 AM  CMP  Glucose 70 - 99 mg/dL 409   BUN 6 - 20 mg/dL 11   Creatinine 8.11 - 1.00 mg/dL 9.14   Sodium 782 - 956 mmol/L 134   Potassium 3.5 - 5.1 mmol/L 3.7   Chloride 98 - 111 mmol/L 102   CO2 22 - 32 mmol/L 22   Calcium 8.9 - 10.3 mg/dL 8.2       Latest Ref Rng & Units 07/06/2023    3:28 AM  CBC  WBC 4.0 - 10.5 K/uL 9.5   Hemoglobin 12.0 - 15.0 g/dL 8.7   Hematocrit 21.3 -  46.0 % 27.8   Platelets 150 - 400 K/uL 223     @IMAGES @  MR BRAIN W WO CONTRAST  Result Date:  07/05/2023 CLINICAL DATA:  57 year old female altered mental status. 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 Subsequent encounter. EXAM: MRI HEAD WITHOUT AND WITH CONTRAST TECHNIQUE: Multiplanar, multiecho pulse sequences of the brain and surrounding structures were obtained without and with intravenous contrast. CONTRAST:  7.54mL GADAVIST GADOBUTROL 1 MMOL/ML IV SOLN COMPARISON:  Head CT this morning.  Brain MRI 01/20/2021. FINDINGS: Brain: Motion artifact on postcontrast imaging despite repeated imaging attempts. There are numerable subcentimeter FLAIR and T2 hyperintense foci scattered throughout the bilateral brain (see series 9), some of the largest of which correspond to the hyperdense foci by CT this morning (including adjacent to the right occipital horn series 9, image 24 measuring 9 mm). None of these are identified on SWI with susceptibility. However, some are mildly T1 hyperintense (such as in the left anterior frontal lobe series 7, image 16). But nearly all of the lesions are conspicuous on diffusion imaging, some are restricted and some have ring like morphology (series 13, image 28). None of the lesions are very apparent on motion degraded axial postcontrast imaging. But a few of them do appear to be enhancing on coronal postcontrast imaging in the posterior left hemisphere (series 21). Furthermore, when comparing pre and postcontrast sagittal T1 weighted imaging, it is suspected that many of the small lesions do abnormally enhance (see series 22). No associated vasogenic edema. And no significant intracranial mass effect. No dural thickening or enhancement. No superimposed restricted diffusion which is strongly suggestive of acute infarction. No ventriculomegaly, extra-axial collection. Cervicomedullary junction and pituitary are within normal limits. Vascular: Major  intracranial vascular flow voids are stable. Following contrast the major dural venous sinuses are enhancing and appear to be patent. Skull and upper cervical spine: Thick left superior frontal bone metastasis was better demonstrated by CT this morning, decreased T2 signal there is new since the 2022 MRI. No destructive osseous lesion is identified. Sinuses/Orbits: Stable, negative. Other: Mastoids remain well aerated. Negative visible scalp and face. IMPRESSION: 1. Innumerable subcentimeter brain lesions which are new since 2022. Although most are difficult to identify on motion degraded post-contrast images, the constellation of MRI findings is more suggestive of brain metastases than embolic infarcts or other differential considerations. And it is possible that most of the lesions are treated metastases, with no associated vasogenic edema or mass effect. A short interval repeat MRI in 2 months may be valuable. 2. Progressed skull metastases since 2022 but no destructive osseous lesion is identified. Electronically Signed   By: Odessa Fleming M.D.   On: 07/05/2023 13:24   CT HEAD WO CONTRAST ( )  Result Date: 07/05/2023 CLINICAL DATA:  57 year old female altered mental status. Possible trace hemorrhage layering in the right occipital horn on head CT 0055 hours today., and small left anterior frontal lobe hyperdensity also. Metastatic lung cancer. EXAM: CT HEAD WITHOUT CONTRAST TECHNIQUE: Contiguous axial images were obtained from the base of the skull through the vertex without intravenous contrast. RADIATION DOSE REDUCTION: This exam was performed according to the departmental dose-optimization program which includes automated exposure control, adjustment of the mA and/or kV according to patient size and/or use of iterative reconstruction technique. COMPARISON:  Head CT 0055 hours today.  Brain MRI 01/20/2021. FINDINGS: Brain: Unchanged small 5 mm hyperdense foci  in both the left anterior frontal and right  occipital lobes both visible on series 2, image 13 now. No associated edema or mass effect by CT. The right occipital lesion now is seen to be separate from the occipital horn which is on series 2, image 15. These appear to be parenchymal. Furthermore, there is a subtle 3rd hyperdense lesion in the right anterior inferior temporal lobe sagittal image 21. And possibly additional punctate lesions such as the left frontal lobe on series 2, image 17. All of these are new since 09/16/2022. No midline shift or intracranial mass effect. No ventriculomegaly. No extra-axial blood identified. No cortically based acute infarct identified. Normal basilar cisterns. Vascular: No suspicious intracranial vascular hyperdensity. Skull: Abnormal left frontal bone appears infiltrated with abnormal sclerosis and periosteal reaction on series 3, image 51, new since 09/16/2022. This is compatible with skull metastasis. No superimposed skull fracture is identified. No lytic bone lesion is identified. Sinuses/Orbits: Visualized paranasal sinuses and mastoids are stable and well aerated. Other: Visualized orbits and scalp soft tissues are within normal limits. IMPRESSION: 1. 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 2. At least 3 subcentimeter hyperdense foci in the bilateral brain are more likely small hyperdense Brain Metastases than areas of intracranial blood. Brain MRI without and with contrast should be confirmatory and could be compared to 01/20/2021. 3. No associated intracranial mass effect.  No cerebral edema by CT. Electronically Signed   By: Odessa Fleming M.D.   On: 07/05/2023 07:54   CT CHEST ABDOMEN PELVIS W CONTRAST  Result Date: 07/05/2023 CLINICAL DATA:  Sepsis.  History of metastatic lung cancer. EXAM: CT CHEST, ABDOMEN, AND PELVIS WITH CONTRAST TECHNIQUE: Multidetector CT imaging of the chest, abdomen and pelvis was performed following the standard protocol during bolus administration of  intravenous contrast. RADIATION DOSE REDUCTION: This exam was performed according to the departmental dose-optimization program which includes automated exposure control, adjustment of the mA and/or kV according to patient size and/or use of iterative reconstruction technique. CONTRAST:  OMNIPAQUE IOHEXOL 300 MG/ML  SOLN COMPARISON:  CT chest abdomen pelvis 04/19/2023 FINDINGS: CT CHEST FINDINGS Cardiovascular: Right chest wall accessed Port-A-Cath with tip at the superior cavoatrial junction. Normal heart size. No significant pericardial effusion. The thoracic aorta is normal in caliber. No atherosclerotic plaque of the thoracic aorta. No coronary artery calcifications. Mediastinum/Nodes: Stable 1 cm subcarinal lymph node. No enlarged mediastinal, hilar, or axillary lymph nodes. Trachea and esophagus demonstrate no significant findings. Tiny hiatal hernia. Subcentimeter hypodensity of the right thyroid gland-no further follow-up indicated. Lungs/Pleura: Right upper lobe and lower lobe patchy airspace opacities. Stable left lower lobe 8 x 7 mm subpleural nodule (4:50). No pulmonary mass. No pleural effusion. No pneumothorax. Musculoskeletal: No chest wall abnormality. Chronic diffuse axial and appendicular sclerotic osseous lesions. No acute displaced fracture. Multilevel degenerative changes of the spine. CT ABDOMEN PELVIS FINDINGS Hepatobiliary: Vague hypodensity along the falciform ligament likely focal fatty infiltration. No gallstones, gallbladder wall thickening, or pericholecystic fluid. No biliary dilatation. Pancreas: No focal lesion. Normal pancreatic contour. No surrounding inflammatory changes. No main pancreatic ductal dilatation. Spleen: Normal in size. Slightly more conspicuous splenic hypodensities measuring of 2 cm (2:53). Adrenals/Urinary Tract: Stable left adrenal gland nodules measuring of to 2.4 x 1.2 cm. Stable right adrenal gland nodules measuring up to 4.6 x 1.6 cm. Bilateral kidneys  enhance symmetrically. No hydronephrosis. No hydroureter. The urinary bladder is unremarkable. Stomach/Bowel: Stomach is within normal limits. No evidence of bowel wall thickening  or dilatation. Appendix appears normal. Vascular/Lymphatic: No abdominal aorta or iliac aneurysm. Mild atherosclerotic plaque of the aorta and its branches. No abdominal, pelvic, or inguinal lymphadenopathy. Reproductive: Uterus and bilateral adnexa are unremarkable. Other: No intraperitoneal free fluid. No intraperitoneal free gas. No organized fluid collection. Musculoskeletal: No abdominal wall hernia or abnormality. Chronic diffuse axial and appendicular sclerotic osseous lesions. No acute displaced fracture. Six lumbar vertebral bodies with sacralization of the L6 level. Grade 1 anterolisthesis L4 on L5. Mild retrolisthesis of L1 on L2. Bilateral intramedullary nail fixation of the femurs. Persistent periprosthetic lucency along the right femoral shaft surgical hardware. IMPRESSION: 1. Multifocal right lung pneumonia versus aspiration pneumonia. Underlying malignancy not fully excluded. 2. Stable left lower lobe 8 x 7 mm subpleural nodule. 3. Stable indeterminate bilateral adrenal gland nodules. 4. Stable indeterminate splenic hypodensities. 5. Chronic diffuse axial and appendicular sclerotic osseous metastases. Electronically Signed   By: Tish Frederickson M.D.   On: 07/05/2023 02:23   CT HEAD WO CONTRAST ( )  Result Date: 07/05/2023 CLINICAL DATA:  Mental status change, unknown cause Pt discharged yesterday after rod placement in L leg - pt altered since d/c yesterday. EXAM: CT HEAD WITHOUT CONTRAST TECHNIQUE: Contiguous axial images were obtained from the base of the skull through the vertex without intravenous contrast. RADIATION DOSE REDUCTION: This exam was performed according to the departmental dose-optimization program which includes automated exposure control, adjustment of the mA and/or kV according to patient size  and/or use of iterative reconstruction technique. COMPARISON:  CT head 09/16/2022 FINDINGS: Brain: No evidence of large-territorial acute infarction. No definite parenchymal hemorrhage. No mass lesion. No definite extra-axial collection. A 3 mm hyperdensity in the region of the posterior horn of the right lateral ventricle. Question vague 4 mm hyperdensity versus artifact within the left frontal lobe (3:14). No mass effect or midline shift. No hydrocephalus. Basilar cisterns are patent. Vascular: No hyperdense vessel. Skull: No acute fracture or focal lesion. Sinuses/Orbits: Paranasal sinuses and mastoid air cells are clear. The orbits are unremarkable. Other: None. IMPRESSION: 1. A 3 mm hyperdensity in the region of the posterior horn of the right lateral ventricle. Indeterminate etiology with hemorrhage not excluded. Recommend follow-up CT in 4-6 hours to evaluate for stability. 2. Question vague 4 mm hyperdensity versus artifact within the left frontal lobe. Recommended attention on follow-up Electronically Signed   By: Tish Frederickson M.D.   On: 07/05/2023 02:01   DG Chest 1 View  Result Date: 07/02/2023 CLINICAL DATA:  Tachycardia EXAM: CHEST  1 VIEW COMPARISON:  None Available. FINDINGS: Abnormal mediastinal silhouette, compatible with known adenopathy. New somewhat nodular opacity in the right midlung. No visible pleural effusions or pneumothorax. Polyarticular degenerative change. IMPRESSION: 1. New somewhat nodular opacity in the right midlung which could represent progressive metastatic disease, treatment change, and/or infection. CT of the chest with contrast could further evaluate if clinically warranted 2. Abnormal mediastinal silhouette, compatible with known lymphadenopathy. Electronically Signed   By: Feliberto Harts M.D.   On: 07/02/2023 13:02   DG HIP UNILAT WITH PELVIS 2-3 VIEWS LEFT  Result Date: 06/30/2023 CLINICAL DATA:  Left hip surgery.  Intraoperative fluoroscopy. EXAM: DG HIP  (WITH OR WITHOUT PELVIS) 2-3V LEFT COMPARISON:  Left femur radiographs 04/20/2023 FINDINGS: Images were performed intraoperatively without the presence of a radiologist. Interval left femoral long intramedullary nail fixation, spanning the multiple previously seen sclerotic left femoral bone diaphyses. No hardware complication is seen. Total fluoroscopy images: 4 Total fluoroscopy time: 120 seconds Total dose: Radiation Exposure  Index (as provided by the fluoroscopic device): 20.73 mGy air Kerma Please see intraoperative findings for further detail. IMPRESSION: Intraoperative fluoroscopy for left femoral long intramedullary nail fixation. Electronically Signed   By: Neita Garnet M.D.   On: 06/30/2023 16:44   DG C-Arm 1-60 Min-No Report  Result Date: 06/30/2023 Fluoroscopy was utilized by the requesting physician.  No radiographic interpretation.   DG C-Arm 1-60 Min-No Report  Result Date: 06/30/2023 Fluoroscopy was utilized by the requesting physician.  No radiographic interpretation.    Assessment and plan- Patient is a 57 y.o. female ***   Thank you for this kind referral and the opportunity to participate in the care of this  Patient   Visit Diagnosis 1. Altered mental status, unspecified altered mental status type   2. Multifocal pneumonia   3. Acute sepsis (HCC)   4. Secondary malignant neoplasm of bone and bone marrow (HCC)   5. 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 Owens Shark, MD, MPH [facility] [phone] 07/06/2023

## 2023-07-06 NOTE — TOC Initial Note (Signed)
Transition of Care [facility]) - Initial/Assessment Note    Patient Details  Name: Gina Sanchez MRN: [id number] Date of Birth: 1965-10-28  Transition of Care [facility]) CM/SW Contact:    Marquita Palms, LCSW Phone Number: 07/06/2023, 9:20 AM  Clinical Narrative:                  CSW met with patient bedside; she presented drowsy. Patient reported she uses [facility]. Patient reported that upon discharge her husband will take her home. Patient reported that she does not have DME at home. No other needs at this time.        Patient Goals and CMS Choice            Expected Discharge Plan and Services                                              Prior Living Arrangements/Services                       Activities of Daily Living      Permission Sought/Granted                  Emotional Assessment              Admission diagnosis:  Sepsis due to pneumonia (HCC) [J18.9, A41.9] Sepsis (HCC) [A41.9] Patient Active Problem List   Diagnosis Date Noted   Sepsis due to pneumonia (HCC) 07/05/2023   Sepsis (HCC) 07/05/2023   Pathologic fracture of part of femur (HCC) 06/30/2023   Cancer related pain 03/10/2023   Poor appetite 03/10/2023   Sinus tachycardia 02/10/2023   Secondary adenocarcinoma of lung (HCC)    Indeterminate colitis    Closed fracture of distal end of left radius 03/23/2021   Metastatic lung cancer (metastasis from lung to other site) [facility]) 10/19/2020   Goals of care, counseling/discussion 10/19/2020   Pathologic fracture    Mediastinal adenopathy    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) 09/15/2020   Closed fracture of right femur, unspecified fracture morphology, initial encounter (HCC) 09/14/2020   Right femoral shaft fracture (HCC) 09/14/2020   Right hip pain 09/14/2020   GAD (generalized anxiety disorder) 09/14/2020   Lumbar radiculopathy 08/06/2020   Tobacco use 03/29/2017    Unstable angina (HCC) 03/25/2017   Essential hypertension    Hyperlipidemia    PCP:  Armando Gang, FNP Pharmacy:   [facility] [place], [place] - [address] [place] [address] Phone: [phone] Fax: [phone]  [facility] - [place], [place] - [address] [place] [address] Phone: [phone] Fax: [phone]  [facility] - [place], [place] - [address] [place] [address] Phone: [phone] Fax: [phone]  [facility] [address] [place] [address] Phone: [phone] Fax: [phone]  [facility] - [place], [place] - [address] [place] [address] Phone: [phone] Fax: [phone]     Social Determinants of Health (SDOH) Social History: SDOH Screenings   Food Insecurity: No Food Insecurity (06/30/2023)  Housing: Low Risk  (06/30/2023)  Transportation Needs: No Transportation Needs (06/30/2023)  Utilities: Not At Risk (06/30/2023)  Tobacco Use: Medium Risk (06/30/2023)  SDOH Interventions:     Readmission Risk Interventions    07/02/2023   12:51 PM  Readmission Risk Prevention Plan  Transportation Screening Complete  PCP or Specialist Appt within 5-7 Days Complete  Home Care Screening Complete  Medication Review (RN CM) Complete

## 2023-07-07 ENCOUNTER — Inpatient Hospital Stay
Admit: 2023-07-07 | Discharge: 2023-07-07 | Disposition: A | Discharge status: Home / Self Care | Payer: 59 | Attending: Radiation Oncology | Admitting: Radiation Oncology

## 2023-07-07 ENCOUNTER — Ambulatory Visit: Payer: 59 | Admitting: Radiation Oncology

## 2023-07-07 ENCOUNTER — Ambulatory Visit: Payer: 59

## 2023-07-07 ENCOUNTER — Inpatient Hospital Stay: Admit: 2023-07-07 | Payer: 59

## 2023-07-07 ENCOUNTER — Encounter: Payer: Self-pay | Admitting: Oncology

## 2023-07-07 ENCOUNTER — Other Ambulatory Visit: Payer: Self-pay

## 2023-07-07 DIAGNOSIS — Z5112 Encounter for antineoplastic immunotherapy: Secondary | ICD-10-CM | POA: Insufficient documentation

## 2023-07-07 DIAGNOSIS — C7951 Secondary malignant neoplasm of bone: Secondary | ICD-10-CM | POA: Insufficient documentation

## 2023-07-07 DIAGNOSIS — C7970 Secondary malignant neoplasm of unspecified adrenal gland: Secondary | ICD-10-CM | POA: Insufficient documentation

## 2023-07-07 DIAGNOSIS — Z510 Encounter for antineoplastic radiation therapy: Secondary | ICD-10-CM | POA: Insufficient documentation

## 2023-07-07 DIAGNOSIS — J189 Pneumonia, unspecified organism: Secondary | ICD-10-CM | POA: Diagnosis not present

## 2023-07-07 DIAGNOSIS — R112 Nausea with vomiting, unspecified: Secondary | ICD-10-CM | POA: Insufficient documentation

## 2023-07-07 DIAGNOSIS — C7931 Secondary malignant neoplasm of brain: Secondary | ICD-10-CM | POA: Insufficient documentation

## 2023-07-07 DIAGNOSIS — A419 Sepsis, unspecified organism: Secondary | ICD-10-CM | POA: Diagnosis not present

## 2023-07-07 DIAGNOSIS — I10 Essential (primary) hypertension: Secondary | ICD-10-CM | POA: Insufficient documentation

## 2023-07-07 DIAGNOSIS — G893 Neoplasm related pain (acute) (chronic): Secondary | ICD-10-CM | POA: Insufficient documentation

## 2023-07-07 DIAGNOSIS — C3490 Malignant neoplasm of unspecified part of unspecified bronchus or lung: Secondary | ICD-10-CM | POA: Insufficient documentation

## 2023-07-07 DIAGNOSIS — K219 Gastro-esophageal reflux disease without esophagitis: Secondary | ICD-10-CM | POA: Insufficient documentation

## 2023-07-07 DIAGNOSIS — C7800 Secondary malignant neoplasm of unspecified lung: Secondary | ICD-10-CM | POA: Insufficient documentation

## 2023-07-07 DIAGNOSIS — Z79899 Other long term (current) drug therapy: Secondary | ICD-10-CM | POA: Insufficient documentation

## 2023-07-07 DIAGNOSIS — C801 Malignant (primary) neoplasm, unspecified: Secondary | ICD-10-CM | POA: Insufficient documentation

## 2023-07-07 MED ORDER — ACETAMINOPHEN 500 MG PO TABS
500.0000 mg | ORAL_TABLET | Freq: Four times a day (QID) | ORAL | Status: DC | PRN
  Administered 2023-07-07 – 2023-07-08 (×2): 500 mg via ORAL
  Filled 2023-07-07 (×2): qty 1

## 2023-07-07 MED ORDER — HYDROMORPHONE HCL 2 MG PO TABS
4.0000 mg | ORAL_TABLET | ORAL | Status: DC | PRN
  Administered 2023-07-07 – 2023-07-08 (×6): 6 mg via ORAL
  Administered 2023-07-08: 4 mg via ORAL
  Administered 2023-07-08 – 2023-07-09 (×2): 6 mg via ORAL
  Filled 2023-07-07 (×2): qty 3
  Filled 2023-07-07: qty 2
  Filled 2023-07-07: qty 3
  Filled 2023-07-07: qty 2
  Filled 2023-07-07: qty 3
  Filled 2023-07-07: qty 2
  Filled 2023-07-07 (×3): qty 3

## 2023-07-07 MED ORDER — DEXAMETHASONE 4 MG PO TABS
4.0000 mg | ORAL_TABLET | Freq: Two times a day (BID) | ORAL | Status: DC
  Administered 2023-07-07 – 2023-07-09 (×4): 4 mg via ORAL
  Filled 2023-07-07 (×4): qty 1

## 2023-07-07 MED ORDER — HYDROCORTISONE 10 MG PO TABS
10.0000 mg | ORAL_TABLET | Freq: Every evening | ORAL | Status: DC
  Administered 2023-07-07 – 2023-07-08 (×2): 10 mg via ORAL
  Filled 2023-07-07 (×3): qty 1

## 2023-07-07 MED ORDER — FENTANYL 25 MCG/HR TD PT72
1.0000 | MEDICATED_PATCH | TRANSDERMAL | Status: DC
  Administered 2023-07-08: 1 via TRANSDERMAL
  Filled 2023-07-07 (×4): qty 1

## 2023-07-07 MED ORDER — ALPRAZOLAM 0.5 MG PO TABS
0.5000 mg | ORAL_TABLET | Freq: Once | ORAL | Status: AC
  Administered 2023-07-08: 0.5 mg via ORAL
  Filled 2023-07-07: qty 1

## 2023-07-07 MED ORDER — FENTANYL 50 MCG/HR TD PT72
1.0000 | MEDICATED_PATCH | TRANSDERMAL | Status: DC
  Filled 2023-07-07: qty 1

## 2023-07-07 MED ORDER — LIDOCAINE 5 % EX PTCH
3.0000 | MEDICATED_PATCH | CUTANEOUS | Status: DC
  Administered 2023-07-07 – 2023-07-08 (×2): 3 via TRANSDERMAL
  Filled 2023-07-07 (×2): qty 3

## 2023-07-07 MED ORDER — HYDROCORTISONE 10 MG PO TABS
20.0000 mg | ORAL_TABLET | Freq: Every day | ORAL | Status: DC
  Administered 2023-07-08 – 2023-07-09 (×2): 20 mg via ORAL
  Filled 2023-07-07 (×2): qty 2

## 2023-07-07 NOTE — Progress Notes (Signed)
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 Patient refused her Keppra. Patient states the physician is supposed to be discontinuing this medication. I am notifying you because it is a red medication refusal   Patient is complaining of 5/10 back pain. She received 6mg  PO dilaudid at 2042. She doesn't have any medication available for pain to be administered. Her tylenol is not able to be administered until 0005. She is also requesting a xanax but her prn order is for prn daily and she already took 1 dose today   Pertinent findings on chart review:   Assessment and  Interventions   Assessment:    07/07/2023    7:57 PM 07/07/2023    3:29 PM 07/07/2023    8:53 AM  Vitals with BMI  Systolic 149 142 409  Diastolic 94 96 106  Pulse 99 105 110    Plan:  Additional dose of xanax ordered Acetaminophen can be given 30 min early       Gina Mesa NP [facility] Cross Cover 7pm-7am - check amion for availability Pager [phone]

## 2023-07-07 NOTE — TOC Progression Note (Signed)
Transition of Care [facility]) - Progression Note    Patient Details  Name: Gina Sanchez MRN: [id number] Date of Birth: [birth year]  [facility]) CM/SW Contact  Truddie Hidden, RN Phone Number: 07/07/2023, 2:35 PM  Clinical Narrative:    Per [place] at [facility]. Patient is active until 12/19 for PT.         Expected Discharge Plan and Services                                               Social Determinants of Health (SDOH) Interventions SDOH Screenings   Food Insecurity: No Food Insecurity (07/06/2023)  Housing: Low Risk  (07/06/2023)  Transportation Needs: No Transportation Needs (07/06/2023)  Utilities: Not At Risk (07/06/2023)  Tobacco Use: Medium Risk (07/06/2023)    Readmission Risk Interventions    07/06/2023    9:25 AM 07/02/2023   12:51 PM  Readmission Risk Prevention Plan  Transportation Screening Complete Complete  PCP or Specialist Appt within 5-7 Days  Complete  PCP or Specialist Appt within 3-5 Days Complete   Home Care Screening  Complete  Medication Review (RN CM)  Complete  HRI or Home Care Consult Patient refused   Social Work Consult for Recovery Care Planning/Counseling Patient refused   Palliative Care Screening Patient Refused   Medication Review [employer]) Complete

## 2023-07-07 NOTE — Progress Notes (Signed)
PROGRESS NOTE    Gina Sanchez  ZOX:[id number] DOB: March 20, 1966 DOA: 07/04/2023 PCP: Armando Gang, FNP  [id number]  LOS: 2 days   Brief hospital course:   Assessment & Plan: Gina Sanchez is a 57 y.o. 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    Severe Sepsis -Evidenced by tachycardia, fever 102.2, elevated lactic acid level.  Source of infection likely multifocal pneumonia.  Multifocal PNA --thought to be due to aspiration, abx narrowed to Unasyn on admission. --SLP eval found normal swallow function.  Given Right upper lobe and lower lobe patchy airspace opacities, less likely to be due to aspiration.  Switched abx back to ceftriaxone Plan: --cont ceftriaxone and azithromycin   Acute metabolic encephalopathy likely due to seizures --CT head showed patient has significant bilateral metastatic lesions which is new compared to previous brain images.  New onset seizure suspected.  Neuro consulted. --loaded on IV keppra Plan: --cont Keppra - Ambulatory referral to neurology for outpatient f/u    Acute on chronic normocytic anemia -No symptoms signs of active bleeding.   Acquired adrenal insufficiency --started on stress dose steroid on admission --switch back to home Cortef today   HTN -BP borderline low,   Recent Left-sided femur surgery -Stable, no concern about active infection   Metastatic non-small lung CA New onset of brain metastasis -On chemo and immunotherapy, both on hold for hip surgery. --oncology consult, with Dr. Smith Robert  --start decadron 4 mg BID, per Dr. Smith Robert --refer to radiation oncology --cont pain management   DVT prophylaxis: Lovenox SQ Code Status: Full code   Family Communication:  Level of care: Telemetry Medical Dispo:   The patient is from: home Anticipated d/c is to: home Anticipated d/c date is: 2-3 days   Subjective and Interval History:  Pt reported pain uncontrolled and requested increased dose of oral dilaudid.     Objective: Vitals:   07/06/23 1928 07/07/23 0324 07/07/23 0853 07/07/23 1529  BP: (!) 128/93 (!) 143/103 (!) 154/106 (!) 142/96  Pulse: (!) 110 (!) 108 (!) 110 (!) 105  Resp: 18 16 17 17   Temp: 97.9 F (36.6 C) (!) 97.5 F (36.4 C) 98.3 F (36.8 C) 98.2 F (36.8 C)  TempSrc:  Oral    SpO2: 99% 98% 98% 99%  Weight:      Height:        Intake/Output Summary (Last 24 hours) at 07/07/2023 1904 Last data filed at 07/07/2023 1031 Gross per 24 hour  Intake 240 ml  Output --  Net 240 ml   Filed Weights   07/04/23 2242  Weight: 81.4 kg    Examination:   Constitutional: NAD, AAOx3 HEENT: conjunctivae and lids normal, EOMI CV: No cyanosis.   RESP: normal respiratory effort, on RA Neuro: II - XII grossly intact.     Data Reviewed: I have personally reviewed labs and imaging studies  Time spent: 35 minutes  Darlin Priestly, MD [facility] If 7PM-7AM, please contact night-coverage 07/07/2023, 7:04 PM

## 2023-07-07 NOTE — Clinical Note (Incomplete)
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 Patient refused her Keppra. Patient states the physician is supposed to be discontinuing this medication. I am notifying you because it is a red medication refusal   Patient is complaining of 5/10 back pain. She received 6mg  PO dilaudid at 2042. She doesn't have any medication available for pain to be administered. Her tylenol is not able to be administered until 0005. She is also requesting a xanax but her prn order is for prn daily and she already took 1 dose today   Pertinent findings on chart review:   Assessment and  Interventions   Assessment:    07/07/2023    7:57 PM 07/07/2023    3:29 PM 07/07/2023    8:53 AM  Vitals with BMI  Systolic 149 142 875  Diastolic 94 96 106  Pulse 99 105 110    Plan:  Additional dose of xanax ordered Acetaminophen can be given 30 min early X

## 2023-07-07 NOTE — Progress Notes (Signed)
Problem: Patient refused keppra dose and states the physician said this medication is going to be discontinued. RN educated the patient on the importance of this medication. Continues to refuse.  RN notified Manuela Schwartz, NP and Larkin Ina, NP of the above information

## 2023-07-08 DIAGNOSIS — Z515 Encounter for palliative care: Secondary | ICD-10-CM | POA: Diagnosis not present

## 2023-07-08 DIAGNOSIS — A419 Sepsis, unspecified organism: Secondary | ICD-10-CM | POA: Diagnosis not present

## 2023-07-08 DIAGNOSIS — J189 Pneumonia, unspecified organism: Secondary | ICD-10-CM | POA: Diagnosis not present

## 2023-07-08 MED ORDER — METOPROLOL SUCCINATE ER 25 MG PO TB24
25.0000 mg | ORAL_TABLET | Freq: Every day | ORAL | Status: DC
  Administered 2023-07-08 – 2023-07-09 (×2): 25 mg via ORAL
  Filled 2023-07-08 (×2): qty 1

## 2023-07-08 MED ORDER — LEVETIRACETAM 500 MG PO TABS
500.0000 mg | ORAL_TABLET | Freq: Two times a day (BID) | ORAL | Status: DC
  Administered 2023-07-08 – 2023-07-09 (×3): 500 mg via ORAL
  Filled 2023-07-08 (×3): qty 1

## 2023-07-08 MED ORDER — FENTANYL 50 MCG/HR TD PT72
1.0000 | MEDICATED_PATCH | TRANSDERMAL | Status: DC
  Administered 2023-07-08: 1 via TRANSDERMAL
  Filled 2023-07-08: qty 1

## 2023-07-08 MED ORDER — POLYETHYLENE GLYCOL 3350 17 G PO PACK
17.0000 g | PACK | Freq: Once | ORAL | Status: DC
  Administered 2023-07-08: 17 g via ORAL
  Filled 2023-07-08: qty 1

## 2023-07-08 MED ORDER — MELATONIN 5 MG PO TABS
5.0000 mg | ORAL_TABLET | Freq: Once | ORAL | Status: AC
  Administered 2023-07-08: 5 mg via ORAL
  Filled 2023-07-08: qty 1

## 2023-07-08 NOTE — Plan of Care (Signed)

## 2023-07-08 NOTE — Consult Note (Signed)
Palliative Medicine [facility] at [facility] [phone] [phone]   Name: Gina Sanchez Date: 07/08/2023 MRN: [id number]  DOB: 14-Mar-1966  Patient Care Team: Armando Gang, FNP as PCP - General (Family Medicine) Glory Buff, RN as Oncology Nurse Navigator Carmina Miller, MD as Radiation Oncologist (Radiation Oncology) Vida Rigger, MD as Consulting Physician (Pulmonary Disease) Creig Hines, MD as Consulting Physician (Oncology)    REASON FOR CONSULTATION: Gina Sanchez is a 57 y.o. 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  Patient was hospitalized 06/30/2023 to 07/03/2023 with pathologic left femur fracture requiring pinning.  Patient was readmitted on 07/05/2023 with sepsis and acute metabolic encephalopathy from pneumonia.  Palliative care was consulted to address goals and manage ongoing symptoms.  SOCIAL HISTORY:     reports that she quit smoking about 2 years ago. Her smoking use included cigarettes. She started smoking about 32 years ago. She has a 30 pack-year smoking history. She has never used smokeless tobacco. She reports that she does not currently use alcohol after a past usage of about 1.0 standard drink of alcohol per week. She reports that she does not use drugs.  Patient is married lives at home with her husband.  ADVANCE DIRECTIVES:  Not on file  CODE STATUS: Full code  PAST MEDICAL HISTORY: Past Medical History:  Diagnosis Date   Abnormal Pap smear of cervix    Anxiety    C. 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   Chronic diarrhea    Closed fracture of distal end of left radius 03/23/2021   Colon polyp    Depression    Diverticulosis    Essential hypertension    Femur fracture, right (HCC) 09/14/2020   GERD (gastroesophageal reflux disease)    Hyperlipidemia     Leukocytosis    Lumbar radiculopathy    Lung cancer (HCC)    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   Metastatic cancer (HCC) 10/2020   lung, bone, lymph node, adrenal   Palpitations    Precordial chest pain    Wrist fracture 03/2021   left    PAST SURGICAL HISTORY:  Past Surgical History:  Procedure Laterality Date   BREAST BIOPSY Right 2017   benign   CERVICAL BIOPSY  W/ LOOP ELECTRODE EXCISION     COLONOSCOPY  06/11/2022   Procedure: COLONOSCOPY;  Surgeon: Toney Reil, MD;  Location: [facility] ENDOSCOPY;  Service: Gastroenterology;;   COLONOSCOPY WITH PROPOFOL N/A 07/03/2015   Procedure: COLONOSCOPY WITH PROPOFOL;  Surgeon: Wallace Cullens, MD;  Location: [facility] ENDOSCOPY;  Service: Gastroenterology;  Laterality: N/A;   COLONOSCOPY WITH PROPOFOL N/A 01/06/2022   Procedure: COLONOSCOPY WITH PROPOFOL;  Surgeon: Toney Reil, MD;  Location: [facility] ENDOSCOPY;  Service: Gastroenterology;  Laterality: N/A;   ESOPHAGOGASTRODUODENOSCOPY (EGD) WITH PROPOFOL N/A 07/03/2015   Procedure: ESOPHAGOGASTRODUODENOSCOPY (EGD) WITH PROPOFOL;  Surgeon: Wallace Cullens, MD;  Location: [facility] ENDOSCOPY;  Service: Gastroenterology;  Laterality: N/A;   FEMUR IM NAIL Left 06/30/2023   Procedure: Left prophylactic intramedullary nailing of impending left subtrochanteric femur fracture;  Surgeon: Christena Flake, MD;  Location: [facility] ORS;  Service: Orthopedics;  Laterality: Left;   FLEXIBLE SIGMOIDOSCOPY N/A 03/12/2022   Procedure: FLEXIBLE SIGMOIDOSCOPY;  Surgeon: Toney Reil, MD;  Location: [facility] ENDOSCOPY;  Service: Gastroenterology;  Laterality: N/A;   FLEXIBLE SIGMOIDOSCOPY N/A 08/17/2022   Procedure: FLEXIBLE  SIGMOIDOSCOPY;  Surgeon: Toney Reil, MD;  Location: [facility] ENDOSCOPY;  Service: Gastroenterology;  Laterality: N/A;   INTRAMEDULLARY (IM) NAIL INTERTROCHANTERIC Right 09/15/2020   Procedure: INTRAMEDULLARY (IM) NAIL INTERTROCHANTRIC;  Surgeon: Christena Flake, MD;  Location: [facility] ORS;  Service:  Orthopedics;  Laterality: Right;   IR CV LINE INJECTION  07/14/2022   IR IMAGING GUIDED PORT INSERTION  11/28/2020   LEFT HEART CATH AND CORONARY ANGIOGRAPHY N/A 03/28/2017   Procedure: Left Heart Cath and Coronary Angiography;  Surgeon: Runell Gess, MD;  Location: [facility] INVASIVE CV LAB;  Service: Cardiovascular;  Laterality: N/A;   ORIF WRIST FRACTURE Left 03/31/2021   Procedure: OPEN REDUCTION INTERNAL FIXATION (ORIF) LEFT DISTAL RADIUS FRACTURE.;  Surgeon: Christena Flake, MD;  Location: [facility] ORS;  Service: Orthopedics;  Laterality: Left;    HEMATOLOGY/ONCOLOGY HISTORY:  Oncology History  Metastatic lung cancer (metastasis from lung to other site) [facility])  10/19/2020 Initial Diagnosis   Metastatic lung cancer (metastasis from lung to other site) [facility])   10/19/2020 Cancer Staging   Staging form: Lung, AJCC 8th Edition - Clinical: Stage IV (cT1, cN3, pM1) - Signed by Creig Hines, MD on 10/19/2020   10/27/2020 - 02/12/2022 Chemotherapy   Patient is on Treatment Plan : LUNG NSCLC Pemetrexed (Alimta) / Carboplatin q21d x 1 cycles     06/08/2022 - 09/15/2022 Chemotherapy   Patient is on Treatment Plan : LUNG NSCLC Pemetrexed + Carboplatin q21d x 4 Cycles     02/17/2023 -  Chemotherapy   Patient is on Treatment Plan : LUNG NSCLC Carboplatin + Paclitaxel + Bevacizumab q21d       ALLERGIES:  is allergic to factive [gemifloxacin].  MEDICATIONS:  Current Facility-Administered Medications  Medication Dose Route Frequency Provider Last Rate Last Admin   acetaminophen (TYLENOL) tablet 500 mg  500 mg Oral Q6H PRN Darlin Priestly, MD   500 mg at 07/08/23 0004   ALPRAZolam Prudy Feeler) tablet 0.5 mg  0.5 mg Oral Daily PRN Mikey College T, MD   0.5 mg at 07/07/23 0854   azithromycin (ZITHROMAX) tablet 500 mg  500 mg Oral Daily Mikey College T, MD   500 mg at 07/08/23 1610   cefTRIAXone (ROCEPHIN) 1 g in sodium chloride 0.9 % 100 mL IVPB  1 g Intravenous Q24H Darlin Priestly, MD 200 mL/hr at 07/07/23 1757 1 g at 07/07/23 1757    Chlorhexidine Gluconate Cloth 2 % PADS 6 each  6 each Topical Daily Darlin Priestly, MD   6 each at 07/08/23 0937   citalopram (CELEXA) tablet 10 mg  10 mg Oral QHS Mikey College T, MD   10 mg at 07/07/23 2043   dexamethasone (DECADRON) tablet 4 mg  4 mg Oral BID Darlin Priestly, MD   4 mg at 07/08/23 0937   enoxaparin (LOVENOX) injection 40 mg  40 mg Subcutaneous Q24H Mikey College T, MD   40 mg at 07/08/23 0937   fentaNYL (DURAGESIC) 50 MCG/HR 1 patch  1 patch Transdermal Q72H Eugenio Dollins, Daryl Eastern, NP   1 patch at 07/08/23 1349   guaiFENesin (MUCINEX) 12 hr tablet 1,200 mg  1,200 mg Oral BID Mikey College T, MD   1,200 mg at 07/08/23 9604   hydrocortisone (CORTEF) tablet 10 mg  10 mg Oral QPM Darlin Priestly, MD   10 mg at 07/07/23 1753   hydrocortisone (CORTEF) tablet 20 mg  20 mg Oral Daily Darlin Priestly, MD   20 mg at 07/08/23 0936   HYDROmorphone (DILAUDID) tablet 4-6 mg  4-6 mg Oral Q4H PRN Darlin Priestly, MD   6 mg at 07/08/23 1342   levETIRAcetam (KEPPRA) tablet 500 mg  500 mg Oral BID Darlin Priestly, MD   500 mg at 07/08/23 1338   lidocaine (LIDODERM) 5 % 3 patch  3 patch Transdermal Q24H Darlin Priestly, MD   3 patch at 07/07/23 1816   LORazepam (ATIVAN) injection 0.5 mg  0.5 mg Intravenous Q6H PRN Mikey College T, MD   0.5 mg at 07/05/23 1037   metoprolol succinate (TOPROL-XL) 24 hr tablet 25 mg  25 mg Oral Daily Darlin Priestly, MD   25 mg at 07/08/23 1100   metoprolol tartrate (LOPRESSOR) injection 2.5 mg  2.5 mg Intravenous Q4H PRN Mikey College T, MD   2.5 mg at 07/08/23 0939   ondansetron (ZOFRAN) injection 4 mg  4 mg Intravenous Q6H PRN Emeline General, MD       Facility-Administered Medications Ordered in Other Encounters  Medication Dose Route Frequency Provider Last Rate Last Admin   sodium chloride flush (NS) 0.9 % injection 10 mL  10 mL Intravenous PRN Creig Hines, MD   10 mL at 03/09/21 0906    VITAL SIGNS: BP (!) 158/111   Pulse 97   Temp (!) 97.4 F (36.3 C) (Oral)   Resp 18   Ht 5\' 6"  (1.676 m)   Wt 179 lb 6.4 oz  (81.4 kg)   LMP 09/15/2018   SpO2 100%   BMI 28.96 kg/m  Filed Weights   07/04/23 2242  Weight: 179 lb 6.4 oz (81.4 kg)    Estimated body mass index is 28.96 kg/m as calculated from the following:   Height as of this encounter: 5\' 6"  (1.676 m).   Weight as of this encounter: 179 lb 6.4 oz (81.4 kg).  LABS: CBC:    Component Value Date/Time   WBC 9.5 07/06/2023 0328   HGB 8.7 (L) 07/06/2023 0328   HGB 10.4 (L) 06/15/2023 1240   HGB 13.0 05/31/2022 1156   HCT 27.8 (L) 07/06/2023 0328   HCT 40.9 05/31/2022 1156   PLT 223 07/06/2023 0328   PLT 304 06/15/2023 1240   PLT 331 05/31/2022 1156   MCV 87.7 07/06/2023 0328   MCV 87 05/31/2022 1156   NEUTROABS 7.6 07/05/2023 0416   NEUTROABS 6.5 03/14/2018 1343   LYMPHSABS 1.3 07/05/2023 0416   LYMPHSABS 4.1 (H) 03/14/2018 1343   MONOABS 0.8 07/05/2023 0416   EOSABS 0.2 07/05/2023 0416   EOSABS 0.4 03/14/2018 1343   BASOSABS 0.0 07/05/2023 0416   BASOSABS 0.0 03/14/2018 1343   Comprehensive Metabolic Panel:    Component Value Date/Time   NA 134 (L) 07/06/2023 0328   NA 141 05/31/2022 1156   K 3.7 07/06/2023 0328   CL 102 07/06/2023 0328   CO2 22 07/06/2023 0328   BUN 11 07/06/2023 0328   BUN 8 05/31/2022 1156   CREATININE 0.54 07/06/2023 0328   CREATININE 0.69 06/15/2023 1240   GLUCOSE 168 (H) 07/06/2023 0328   CALCIUM 8.2 (L) 07/06/2023 0328   AST 16 07/05/2023 0416   AST 20 06/15/2023 1240   ALT 10 07/05/2023 0416   ALT 12 06/15/2023 1240   ALKPHOS 99 07/05/2023 0416   BILITOT 1.0 07/05/2023 0416   BILITOT 0.4 06/15/2023 1240   PROT 6.1 (L) 07/05/2023 0416   PROT 5.9 (L) 05/31/2022 1156   ALBUMIN 2.4 (L) 07/05/2023 0416   ALBUMIN 3.4 (L) 05/31/2022 1156    RADIOGRAPHIC STUDIES: EEG adult  Result Date: 07/06/2023 Jefferson Fuel, MD     07/08/2023  4:13 AM Routine EEG Report Gina Sanchez is a 57 y.o. female with a history of seizures who is undergoing an EEG to evaluate for seizures. Report: This EEG was  acquired with electrodes placed according to the International 10-20 electrode system (including Fp1, Fp2, F3, F4, C3, C4, P3, P4, O1, O2, T3, T4, T5, T6, A1, A2, Fz, Cz, Pz). The following electrodes were missing or displaced: none. The occipital dominant rhythm was 7 Hz with intermittent more pronounced diffuse slowing. This activity is reactive to stimulation. Drowsiness was manifested by background fragmentation; deeper stages of sleep were not identified. There was no focal slowing. There were no interictal epileptiform discharges. There were no electrographic seizures identified. There was no abnormal response to photic stimulation or hyperventilation. Impression and clinical correlation: This EEG was obtained while awake and drowsy and is abnormal due to mild diffuse slowing indicative of global cerebral dysfunction. Epileptiform abnormalities were not seen during this recording. Bing Neighbors, MD [facility] [phone] If 7pm- 7am, please page neurology on call as listed in AMION.   MR BRAIN W WO CONTRAST  Result Date: 07/05/2023 CLINICAL DATA:  57 year old female altered mental status. 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 Subsequent encounter. EXAM: MRI HEAD WITHOUT AND WITH CONTRAST TECHNIQUE: Multiplanar, multiecho pulse sequences of the brain and surrounding structures were obtained without and with intravenous contrast. CONTRAST:  7.56mL GADAVIST GADOBUTROL 1 MMOL/ML IV SOLN COMPARISON:  Head CT this morning.  Brain MRI 01/20/2021. FINDINGS: Brain: Motion artifact on postcontrast imaging despite repeated imaging attempts. There are numerable subcentimeter FLAIR and T2 hyperintense foci scattered throughout the bilateral brain (see series 9), some of the largest of which correspond to the hyperdense foci by CT this morning (including adjacent to the  right occipital horn series 9, image 24 measuring 9 mm). None of these are identified on SWI with susceptibility. However, some are mildly T1 hyperintense (such as in the left anterior frontal lobe series 7, image 16). But nearly all of the lesions are conspicuous on diffusion imaging, some are restricted and some have ring like morphology (series 13, image 28). None of the lesions are very apparent on motion degraded axial postcontrast imaging. But a few of them do appear to be enhancing on coronal postcontrast imaging in the posterior left hemisphere (series 21). Furthermore, when comparing pre and postcontrast sagittal T1 weighted imaging, it is suspected that many of the small lesions do abnormally enhance (see series 22). No associated vasogenic edema. And no significant intracranial mass effect. No dural thickening or enhancement. No superimposed restricted diffusion which is strongly suggestive of acute infarction. No ventriculomegaly, extra-axial collection. Cervicomedullary junction and pituitary are within normal limits. Vascular: Major intracranial vascular flow voids are stable. Following contrast the major dural venous sinuses are enhancing and appear to be patent. Skull and upper cervical spine: Thick left superior frontal bone metastasis was better demonstrated by CT this morning, decreased T2 signal there is new since the 2022 MRI. No destructive osseous lesion is identified. Sinuses/Orbits: Stable, negative. Other: Mastoids remain well aerated. Negative visible scalp and face. IMPRESSION: 1. Innumerable subcentimeter brain lesions which are new since 2022. Although most are difficult to identify on motion degraded post-contrast images, the constellation of MRI findings is more suggestive of brain metastases than embolic infarcts or other differential considerations. And  it is possible that most of the lesions are treated metastases, with no associated vasogenic edema or mass effect. A short interval  repeat MRI in 2 months may be valuable. 2. Progressed skull metastases since 2022 but no destructive osseous lesion is identified. Electronically Signed   By: Odessa Fleming M.D.   On: 07/05/2023 13:24   CT HEAD WO CONTRAST ( )  Result Date: 07/05/2023 CLINICAL DATA:  57 year old female altered mental status. Possible trace hemorrhage layering in the right occipital horn on head CT 0055 hours today., and small left anterior frontal lobe hyperdensity also. Metastatic lung cancer. EXAM: CT HEAD WITHOUT CONTRAST TECHNIQUE: Contiguous axial images were obtained from the base of the skull through the vertex without intravenous contrast. RADIATION DOSE REDUCTION: This exam was performed according to the departmental dose-optimization program which includes automated exposure control, adjustment of the mA and/or kV according to patient size and/or use of iterative reconstruction technique. COMPARISON:  Head CT 0055 hours today.  Brain MRI 01/20/2021. FINDINGS: Brain: Unchanged small 5 mm hyperdense foci in both the left anterior frontal and right occipital lobes both visible on series 2, image 13 now. No associated edema or mass effect by CT. The right occipital lesion now is seen to be separate from the occipital horn which is on series 2, image 15. These appear to be parenchymal. Furthermore, there is a subtle 3rd hyperdense lesion in the right anterior inferior temporal lobe sagittal image 21. And possibly additional punctate lesions such as the left frontal lobe on series 2, image 17. All of these are new since 09/16/2022. No midline shift or intracranial mass effect. No ventriculomegaly. No extra-axial blood identified. No cortically based acute infarct identified. Normal basilar cisterns. Vascular: No suspicious intracranial vascular hyperdensity. Skull: Abnormal left frontal bone appears infiltrated with abnormal sclerosis and periosteal reaction on series 3, image 51, new since 09/16/2022. This is compatible with  skull metastasis. No superimposed skull fracture is identified. No lytic bone lesion is identified. Sinuses/Orbits: Visualized paranasal sinuses and mastoids are stable and well aerated. Other: Visualized orbits and scalp soft tissues are within normal limits. IMPRESSION: 1. 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 2. At least 3 subcentimeter hyperdense foci in the bilateral brain are more likely small hyperdense Brain Metastases than areas of intracranial blood. Brain MRI without and with contrast should be confirmatory and could be compared to 01/20/2021. 3. No associated intracranial mass effect.  No cerebral edema by CT. Electronically Signed   By: Odessa Fleming M.D.   On: 07/05/2023 07:54   CT CHEST ABDOMEN PELVIS W CONTRAST  Result Date: 07/05/2023 CLINICAL DATA:  Sepsis.  History of metastatic lung cancer. EXAM: CT CHEST, ABDOMEN, AND PELVIS WITH CONTRAST TECHNIQUE: Multidetector CT imaging of the chest, abdomen and pelvis was performed following the standard protocol during bolus administration of intravenous contrast. RADIATION DOSE REDUCTION: This exam was performed according to the departmental dose-optimization program which includes automated exposure control, adjustment of the mA and/or kV according to patient size and/or use of iterative reconstruction technique. CONTRAST:  OMNIPAQUE IOHEXOL 300 MG/ML  SOLN COMPARISON:  CT chest abdomen pelvis 04/19/2023 FINDINGS: CT CHEST FINDINGS Cardiovascular: Right chest wall accessed Port-A-Cath with tip at the superior cavoatrial junction. Normal heart size. No significant pericardial effusion. The thoracic aorta is normal in caliber. No atherosclerotic plaque of the thoracic aorta. No coronary artery calcifications. Mediastinum/Nodes: Stable 1 cm subcarinal lymph node. No enlarged mediastinal, hilar, or axillary lymph nodes. Trachea and  esophagus demonstrate no significant findings. Tiny hiatal hernia. Subcentimeter  hypodensity of the right thyroid gland-no further follow-up indicated. Lungs/Pleura: Right upper lobe and lower lobe patchy airspace opacities. Stable left lower lobe 8 x 7 mm subpleural nodule (4:50). No pulmonary mass. No pleural effusion. No pneumothorax. Musculoskeletal: No chest wall abnormality. Chronic diffuse axial and appendicular sclerotic osseous lesions. No acute displaced fracture. Multilevel degenerative changes of the spine. CT ABDOMEN PELVIS FINDINGS Hepatobiliary: Vague hypodensity along the falciform ligament likely focal fatty infiltration. No gallstones, gallbladder wall thickening, or pericholecystic fluid. No biliary dilatation. Pancreas: No focal lesion. Normal pancreatic contour. No surrounding inflammatory changes. No main pancreatic ductal dilatation. Spleen: Normal in size. Slightly more conspicuous splenic hypodensities measuring of 2 cm (2:53). Adrenals/Urinary Tract: Stable left adrenal gland nodules measuring of to 2.4 x 1.2 cm. Stable right adrenal gland nodules measuring up to 4.6 x 1.6 cm. Bilateral kidneys enhance symmetrically. No hydronephrosis. No hydroureter. The urinary bladder is unremarkable. Stomach/Bowel: Stomach is within normal limits. No evidence of bowel wall thickening or dilatation. Appendix appears normal. Vascular/Lymphatic: No abdominal aorta or iliac aneurysm. Mild atherosclerotic plaque of the aorta and its branches. No abdominal, pelvic, or inguinal lymphadenopathy. Reproductive: Uterus and bilateral adnexa are unremarkable. Other: No intraperitoneal free fluid. No intraperitoneal free gas. No organized fluid collection. Musculoskeletal: No abdominal wall hernia or abnormality. Chronic diffuse axial and appendicular sclerotic osseous lesions. No acute displaced fracture. Six lumbar vertebral bodies with sacralization of the L6 level. Grade 1 anterolisthesis L4 on L5. Mild retrolisthesis of L1 on L2. Bilateral intramedullary nail fixation of the femurs.  Persistent periprosthetic lucency along the right femoral shaft surgical hardware. IMPRESSION: 1. Multifocal right lung pneumonia versus aspiration pneumonia. Underlying malignancy not fully excluded. 2. Stable left lower lobe 8 x 7 mm subpleural nodule. 3. Stable indeterminate bilateral adrenal gland nodules. 4. Stable indeterminate splenic hypodensities. 5. Chronic diffuse axial and appendicular sclerotic osseous metastases. Electronically Signed   By: Tish Frederickson M.D.   On: 07/05/2023 02:23   CT HEAD WO CONTRAST ( )  Result Date: 07/05/2023 CLINICAL DATA:  Mental status change, unknown cause Pt discharged yesterday after rod placement in L leg - pt altered since d/c yesterday. EXAM: CT HEAD WITHOUT CONTRAST TECHNIQUE: Contiguous axial images were obtained from the base of the skull through the vertex without intravenous contrast. RADIATION DOSE REDUCTION: This exam was performed according to the departmental dose-optimization program which includes automated exposure control, adjustment of the mA and/or kV according to patient size and/or use of iterative reconstruction technique. COMPARISON:  CT head 09/16/2022 FINDINGS: Brain: No evidence of large-territorial acute infarction. No definite parenchymal hemorrhage. No mass lesion. No definite extra-axial collection. A 3 mm hyperdensity in the region of the posterior horn of the right lateral ventricle. Question vague 4 mm hyperdensity versus artifact within the left frontal lobe (3:14). No mass effect or midline shift. No hydrocephalus. Basilar cisterns are patent. Vascular: No hyperdense vessel. Skull: No acute fracture or focal lesion. Sinuses/Orbits: Paranasal sinuses and mastoid air cells are clear. The orbits are unremarkable. Other: None. IMPRESSION: 1. A 3 mm hyperdensity in the region of the posterior horn of the right lateral ventricle. Indeterminate etiology with hemorrhage not excluded. Recommend follow-up CT in 4-6 hours to evaluate for  stability. 2. Question vague 4 mm hyperdensity versus artifact within the left frontal lobe. Recommended attention on follow-up Electronically Signed   By: Tish Frederickson M.D.   On: 07/05/2023 02:01   DG Chest 1 View  Result  Date: 07/02/2023 CLINICAL DATA:  Tachycardia EXAM: CHEST  1 VIEW COMPARISON:  None Available. FINDINGS: Abnormal mediastinal silhouette, compatible with known adenopathy. New somewhat nodular opacity in the right midlung. No visible pleural effusions or pneumothorax. Polyarticular degenerative change. IMPRESSION: 1. New somewhat nodular opacity in the right midlung which could represent progressive metastatic disease, treatment change, and/or infection. CT of the chest with contrast could further evaluate if clinically warranted 2. Abnormal mediastinal silhouette, compatible with known lymphadenopathy. Electronically Signed   By: Feliberto Harts M.D.   On: 07/02/2023 13:02   DG HIP UNILAT WITH PELVIS 2-3 VIEWS LEFT  Result Date: 06/30/2023 CLINICAL DATA:  Left hip surgery.  Intraoperative fluoroscopy. EXAM: DG HIP (WITH OR WITHOUT PELVIS) 2-3V LEFT COMPARISON:  Left femur radiographs 04/20/2023 FINDINGS: Images were performed intraoperatively without the presence of a radiologist. Interval left femoral long intramedullary nail fixation, spanning the multiple previously seen sclerotic left femoral bone diaphyses. No hardware complication is seen. Total fluoroscopy images: 4 Total fluoroscopy time: 120 seconds Total dose: Radiation Exposure Index (as provided by the fluoroscopic device): 20.73 mGy air Kerma Please see intraoperative findings for further detail. IMPRESSION: Intraoperative fluoroscopy for left femoral long intramedullary nail fixation. Electronically Signed   By: Neita Garnet M.D.   On: 06/30/2023 16:44   DG C-Arm 1-60 Min-No Report  Result Date: 06/30/2023 Fluoroscopy was utilized by the requesting physician.  No radiographic interpretation.   DG C-Arm 1-60  Min-No Report  Result Date: 06/30/2023 Fluoroscopy was utilized by the requesting physician.  No radiographic interpretation.    PERFORMANCE STATUS (ECOG) : 2 - Symptomatic, <50% confined to bed  Review of Systems Unless otherwise noted, a complete review of systems is negative.  Physical Exam General: NAD Cardiovascular: regular rate and rhythm Pulmonary: clear ant fields Abdomen: soft, nontender, + bowel sounds GU: no suprapubic tenderness Extremities: no edema, no joint deformities Skin: no rashes Neurological: Weakness but otherwise nonfocal  IMPRESSION: Patient well-known to me from the clinic.  She was recently hospitalized with pathologic left femur fracture requiring surgical intervention.  Now readmitted with pneumonia.    Patient has chronic low back pain for which she has been taking fentanyl and hydromorphone with good success.  However, over the last several days, patient says that her low back pain has been poorly controlled.  I spoke with hospitalist yesterday who liberalize dosing of oral hydromorphone.  Patient says that that has not helped much.  Lidoderm patch were also applied.  She describes pain in the low back which does not radiate.  CT of the chest abdomen pelvis on 07/05/2023 showed anterolisthesis of L4-L5 and retrolisthesis of L1 and L2.  However, would not anticipate these findings as significantly contributing to worsening of pain.  Discussed option of getting additional spine specific imaging but patient was reluctant.  She also says that she would not be interested in any interventional approaches.  Will therefore liberalize dosing of transdermal fentanyl.  Continue oral hydromorphone as needed for breakthrough pain.  Patient has advanced directives at home but they are not on file.  Would recommend that they be scanned into her chart.  She would want her husband to be involved in decision-making if needed.  Patient says that she has not given much  consideration to CODE STATUS.   PLAN: -Continue current scope of treatment -Increase transdermal fentanyl 50 mcg every 72 hours -Continue oral hydromorphone as needed for breakthrough pain -PT -Daily bowel regimen -Will follow-up outpatient  Case and plan discussed with  Dr. Smith Robert   Time Total: 45 minutes  Visit consisted of counseling and education dealing with the complex and emotionally intense issues of symptom management and palliative care in the setting of serious and potentially life-threatening illness.Greater than 50%  of this time was spent counseling and coordinating care related to the above assessment and plan.  Signed by: Laurette Schimke, PhD, NP-C

## 2023-07-08 NOTE — Procedures (Signed)
Routine EEG Report  VERNELLA STUDENT is a 57 y.o. female with a history of seizures who is undergoing an EEG to evaluate for seizures.  Report: This EEG was acquired with electrodes placed according to the International 10-20 electrode system (including Fp1, Fp2, F3, F4, C3, C4, P3, P4, O1, O2, T3, T4, T5, T6, A1, A2, Fz, Cz, Pz). The following electrodes were missing or displaced: none.  The occipital dominant rhythm was 7 Hz with intermittent more pronounced diffuse slowing. This activity is reactive to stimulation. Drowsiness was manifested by background fragmentation; deeper stages of sleep were not identified. There was no focal slowing. There were no interictal epileptiform discharges. There were no electrographic seizures identified. There was no abnormal response to photic stimulation or hyperventilation.   Impression and clinical correlation: This EEG was obtained while awake and drowsy and is abnormal due to mild diffuse slowing indicative of global cerebral dysfunction. Epileptiform abnormalities were not seen during this recording.  Bing Neighbors, MD [facility] [phone]  If 7pm- 7am, please page neurology on call as listed in AMION.

## 2023-07-08 NOTE — Progress Notes (Signed)
PROGRESS NOTE    Gina Sanchez  LKG:[id number] DOB: 29-Mar-1966 DOA: 07/04/2023 PCP: Armando Gang, FNP  [id number]  LOS: 3 days   Brief hospital course:   Assessment & Plan: MYIA KEGEL is a 57 y.o. 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    Severe Sepsis -Evidenced by tachycardia, fever 102.2, elevated lactic acid level.  Source of infection likely multifocal pneumonia.  Multifocal PNA --thought to be due to aspiration, abx narrowed to Unasyn on admission. --SLP eval found normal swallow function.  Given Right upper lobe and lower lobe patchy airspace opacities, less likely to be due to aspiration.  Switched abx back to ceftriaxone Plan: --cont ceftriaxone and azithro for 5 days   Acute metabolic encephalopathy likely due to seizures --CT head showed patient has significant bilateral metastatic lesions which is new compared to previous brain images.  New onset seizure suspected.  Neuro consulted. --loaded on IV keppra Plan: --cont Keppra - Ambulatory referral to neurology for outpatient f/u    Acute on chronic normocytic anemia -No symptoms signs of active bleeding.   Acquired adrenal insufficiency --received stress dose steroid  --cont home Cortef now   HTN --BP trending up --resume home Toprol   Recent Left-sided femur surgery -Stable, no concern about active infection   Metastatic non-small lung CA New onset of brain metastasis -On chemo and immunotherapy, both on hold for hip surgery. --oncology consult, with Dr. Smith Robert  --started on decadron 4 mg BID, per Dr. Smith Robert --refer to radiation oncology --cont pain management, per onc palliative   DVT prophylaxis: Lovenox SQ Code  Status: Full code  Family Communication: husband updated at bedside today Level of care: Telemetry Medical Dispo:   The patient is from: home Anticipated d/c is to: home Anticipated d/c date is: tomorrow   Subjective and Interval History:  Pt continued to complain of pain.     Objective: Vitals:   07/08/23 0802 07/08/23 0914 07/08/23 1019 07/08/23 1100  BP: (!) 168/117 (!) 160/117 (!) 158/105 (!) 158/111  Pulse: (!) 126 (!) 117 94 97  Resp: 18     Temp: (!) 97.4 F (36.3 C)     TempSrc: Oral     SpO2: 99% 100%    Weight:      Height:        Intake/Output Summary (Last 24 hours) at 07/08/2023 1639 Last data filed at 07/08/2023 1541 Gross per 24 hour  Intake 580 ml  Output --  Net 580 ml   Filed Weights   07/04/23 2242  Weight: 81.4 kg    Examination:   Constitutional: NAD, AAOx3 HEENT: conjunctivae and lids normal, EOMI CV: No cyanosis.   RESP: normal respiratory effort, on RA Neuro: II - XII grossly intact.     Data Reviewed: I have personally reviewed labs and imaging studies  Time spent: 35 minutes  Darlin Priestly, MD [facility] If 7PM-7AM, please contact night-coverage 07/08/2023, 4:39 PM

## 2023-07-09 DIAGNOSIS — A419 Sepsis, unspecified organism: Secondary | ICD-10-CM | POA: Diagnosis not present

## 2023-07-09 DIAGNOSIS — J189 Pneumonia, unspecified organism: Secondary | ICD-10-CM | POA: Diagnosis not present

## 2023-07-09 LAB — CULTURE, BLOOD (ROUTINE X 2)
Culture: NO GROWTH
Culture: NO GROWTH
Special Requests: ADEQUATE
Special Requests: ADEQUATE

## 2023-07-09 MED ORDER — HYDROMORPHONE HCL 2 MG PO TABS: 2.0000 mg | ORAL_TABLET | ORAL | 0 refills | Status: DC | PRN

## 2023-07-09 MED ORDER — DEXAMETHASONE 4 MG PO TABS: 4.0000 mg | ORAL_TABLET | Freq: Two times a day (BID) | ORAL | 0 refills | Status: DC

## 2023-07-09 MED ORDER — LEVETIRACETAM 500 MG PO TABS: 500.0000 mg | ORAL_TABLET | Freq: Two times a day (BID) | ORAL | 2 refills | Status: DC

## 2023-07-09 MED ORDER — SODIUM CHLORIDE 0.9 % IV SOLN
1.0000 g | Freq: Once | INTRAVENOUS | Status: AC
  Administered 2023-07-09: 1 g via INTRAVENOUS
  Filled 2023-07-09: qty 10

## 2023-07-09 MED ORDER — HEPARIN SOD (PORK) LOCK FLUSH 100 UNIT/ML IV SOLN
500.0000 [IU] | Freq: Once | INTRAVENOUS | Status: DC
  Filled 2023-07-09: qty 5

## 2023-07-09 NOTE — TOC Transition Note (Signed)
Transition of Care [facility]) - CM/SW Discharge Note   Patient Details  Name: Gina Sanchez MRN: [id number] Date of Birth: 09-Nov-1965  [facility] CM/SW Contact:  Kemper Durie, RN Phone Number: 07/09/2023, 9:36 AM   Clinical Narrative:     Patient aware of discharge orders today, state family will provide transportation home.  HHPT order received, Katina with [facility] aware that patient will be going home today.  TOC will sign off.   Final next level of care: Home w Home Health Services Barriers to Discharge: Barriers Resolved   Patient Goals and CMS Choice CMS Medicare.gov Compare Post Acute Care list provided to:: Patient Choice offered to / list presented to : Patient  Discharge Placement                         Discharge Plan and Services Additional resources added to the After Visit Summary for                            [facility] Arranged: PT HH Agency: [facility] Date [facility] Agency Contacted: 07/09/23 Time HH Agency Contacted: [phone] Representative spoke with at [facility] Agency: Laurelyn Sickle  Social Determinants of Health (SDOH) Interventions SDOH Screenings   Food Insecurity: No Food Insecurity (07/06/2023)  Housing: Low Risk  (07/06/2023)  Transportation Needs: No Transportation Needs (07/06/2023)  Utilities: Not At Risk (07/06/2023)  Tobacco Use: Medium Risk (07/06/2023)     Readmission Risk Interventions    07/06/2023    9:25 AM 07/02/2023   12:51 PM  Readmission Risk Prevention Plan  Transportation Screening Complete Complete  PCP or Specialist Appt within 5-7 Days  Complete  PCP or Specialist Appt within 3-5 Days Complete   Home Care Screening  Complete  Medication Review (RN CM)  Complete  HRI or Home Care Consult Patient refused   Social Work Consult for Recovery Care Planning/Counseling Patient refused   Palliative Care Screening Patient Refused   Medication Review [employer]) Complete

## 2023-07-09 NOTE — Consult Note (Signed)
History of Present Illness The patient, with a known diagnosis of stage 4 adenocarcinoma of the lung, has been previously treated with palliative radiation therapy to multiple areas including the left spinocyte joints, chest for SVC, and right femur. The disease has metastasized to the bone, lymph nodes, and adrenal glands. Recently, the patient was hospitalized due to sepsis and acute metabolic encephalopathy secondary to pneumonia.  Physical Exam HEENT: Crude visual fields within normal range. CHEST: Lungs clear to auscultation. CARDIOVASCULAR: Cardiac examination essentially unremarkable. ABDOMEN: Benign. NEUROLOGICAL: Motor sensory and DTR levels equal and symmetric in upper and lower extremities. Proprioception intact.  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 Recent hospitalization for sepsis and acute metabolic encephalopathy secondary to pneumonia. Innumerable subcentimeter brain lesions consistent with brain metastasis. Progressive skull metastasis noted but no destructive osseous lesions identified. Responded well to steroids. - Proceed with whole brain radiation therapy (30 Gy in 10 fractions). - Risks and benefits of treatment including hair loss, fatigue, alteration of blood counts, and skin reaction were reviewed with the patient. - Planning simulation for radiation therapy scheduled for later in the week.

## 2023-07-09 NOTE — Progress Notes (Signed)
Discharge instructions reviewed with the husband and the patient. Patient sent out via wheelchair with belongings

## 2023-07-09 NOTE — Discharge Summary (Signed)
Physician Discharge Summary   Gina Sanchez  female DOB: [birth year]  ZOX:[id number]  PCP: Armando Gang, FNP  Admit date: 07/04/2023 Discharge date: 07/09/2023  Admitted From: home Disposition:  home Husband updated at bedside prior to discharge. Home Health: Yes CODE STATUS: Full code  Discharge Instructions     Ambulatory referral to Neurology   Complete by: As directed    No wound care   Complete by: As directed       Hospital Course:  For full details, please see H&P, progress notes, consult notes and ancillary notes.  Briefly,  Gina Sanchez is a 57 y.o. 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    Severe Sepsis -Evidenced by tachycardia, fever 102.2, elevated lactic acid level.  Source of infection likely multifocal pneumonia.   Multifocal PNA --Initially thought to be due to aspiration, abx narrowed to Unasyn on admission. --SLP eval found normal swallow function.  Given Right upper lobe and lower lobe patchy airspace opacities, less likely to be due to aspiration.  Switched abx back to ceftriaxone --Pt completed 5 days of ceftriaxone and azithro.  Respiratory status stable and on room air.   Acute metabolic encephalopathy likely due to seizures --CT head showed patient has significant bilateral metastatic lesions which is new compared to previous brain images.  New onset seizure suspected.  Neuro consulted. --loaded on IV keppra and started on oral Keppra. - Ambulatory referral to neurology for outpatient f/u    Acute on chronic normocytic anemia -No symptoms signs of active bleeding.   Acquired adrenal insufficiency --received stress dose steroid then resumed home Cortef.    HTN --cont home Toprol   Recent Left-sided femur surgery --Stable, no concern about active infection --per pt and husband, Eliquis was prescribed for DVT ppx, however, pt wasn't taking it PTA.   Metastatic non-small lung CA New onset of brain metastasis -On chemo and immunotherapy, both on hold for hip surgery. --oncology consult, with Dr. Smith Robert (pt's outpatient oncologist). --started on decadron 4 mg BID, per Dr. Smith Robert --referred to radiation oncology --cont pain management, per onc palliative   Unless noted above, medications under "STOP" list are ones pt was not taking PTA.  Discharge Diagnoses:  Principal Problem:   Sepsis due to pneumonia [facility]) Active Problems:   Sepsis [facility])   Palliative care encounter   Multifocal pneumonia   30 Day Unplanned Readmission Risk Score    Flowsheet Row ED to Hosp-Admission (Current) from 07/04/2023 in [facility]  30 Day Unplanned Readmission Risk Score (%) 18.63 Filed at 07/09/2023 0400       This score is the patient's risk of an unplanned readmission within 30 days of being discharged (0 -100%). The score is based on dignosis, age, lab data, medications, orders, and past utilization.   Low:  0-14.9   Medium: 15-21.9   High: 22-29.9   Extreme: 30 and above         Discharge Instructions:  Allergies as of 07/09/2023       Reactions   Factive [gemifloxacin] Rash        Medication List     STOP taking these medications    apixaban 2.5 MG Tabs tablet Commonly known as: ELIQUIS   OLANZapine 5 MG tablet Commonly  known as: ZyPREXA       TAKE these medications    acetaminophen 500 MG tablet Commonly known as: TYLENOL Take 500 mg by mouth every 6 (six) hours as needed.   ALPRAZolam 0.5 MG tablet Commonly known as: XANAX TAKE 1 TABLET BY MOUTH DAILY AS NEEDED FOR ANXIETY.   citalopram 10 MG tablet Commonly known as: CELEXA Take 1 tablet (10 mg total) by mouth daily. What  changed: when to take this   dexamethasone 4 MG tablet Commonly known as: DECADRON Take 1 tablet (4 mg total) by mouth 2 (two) times daily.   Entyvio 108 MG/0.68ML Sopn Generic drug: Vedolizumab Inject 1 Pen into the skin every 14 (fourteen) days. What changed: when to take this   fentaNYL 25 MCG/HR Commonly known as: DURAGESIC Place 1 patch onto the skin every 3 (three) days.   HYDROcodone bit-homatropine 5-1.5 MG/5ML syrup Commonly known as: Hycodan Take 5 mLs by mouth every 6 (six) hours as needed for cough.   hydrocortisone 10 MG tablet Commonly known as: CORTEF TAKE 1 TABLET BY MOUTH NIGHTLY   hydrocortisone 20 MG tablet Commonly known as: CORTEF TAKE 1 TABLET BY MOUTH EVERY MORNING   HYDROmorphone 2 MG tablet Commonly known as: Dilaudid Take 1-2 tablets (2-4 mg total) by mouth every 4 (four) hours as needed for severe pain. What changed: Another medication with the same name was added. Make sure you understand how and when to take each.   HYDROmorphone 2 MG tablet Commonly known as: Dilaudid Take 1-2 tablets (2-4 mg total) by mouth every 4 (four) hours as needed for severe pain (pain score 7-10) or moderate pain (pain score 4-6). What changed: You were already taking a medication with the same name, and this prescription was added. Make sure you understand how and when to take each.   levETIRAcetam 500 MG tablet Commonly known as: KEPPRA Take 1 tablet (500 mg total) by mouth 2 (two) times daily.   lidocaine 5 % Commonly known as: LIDODERM Place 1 patch onto the skin daily. Remove & Discard patch within 12 hours or as directed by MD   metoprolol succinate 25 MG 24 hr tablet Commonly known as: TOPROL-XL Take 1 tablet (25 mg total) by mouth daily.   ondansetron 4 MG tablet Commonly known as: ZOFRAN TAKE 1 TABLET BY MOUTH EVERY 8 HOURS AS NEEDED FOR NAUSEA AND VOMITING What changed:  how much to take how to take this when to take this reasons to take this    prochlorperazine 10 MG tablet Commonly known as: COMPAZINE Take 1 tablet (10 mg total) by mouth every 6 (six) hours as needed for nausea or vomiting.         Follow-up Information     Creig Hines, MD Follow up in 1 week(s).   Specialty: Oncology Contact information: [address] [place] [address] [phone]                 Allergies  Allergen Reactions   Factive [Gemifloxacin] Rash     The results of significant diagnostics from this hospitalization (including imaging, microbiology, ancillary and laboratory) are listed below for reference.   Consultations:   Procedures/Studies: EEG adult  Result Date: 07/14/2023 Jefferson Fuel, MD     07/08/2023  4:13 AM Routine EEG Report ADLAI SOS is a 57 y.o. female with a history of seizures who is undergoing an EEG to evaluate for seizures. Report: This EEG was acquired with electrodes placed according  to the International 10-20 electrode system (including Fp1, Fp2, F3, F4, C3, C4, P3, P4, O1, O2, T3, T4, T5, T6, A1, A2, Fz, Cz, Pz). The following electrodes were missing or displaced: none. The occipital dominant rhythm was 7 Hz with intermittent more pronounced diffuse slowing. This activity is reactive to stimulation. Drowsiness was manifested by background fragmentation; deeper stages of sleep were not identified. There was no focal slowing. There were no interictal epileptiform discharges. There were no electrographic seizures identified. There was no abnormal response to photic stimulation or hyperventilation. Impression and clinical correlation: This EEG was obtained while awake and drowsy and is abnormal due to mild diffuse slowing indicative of global cerebral dysfunction. Epileptiform abnormalities were not seen during this recording. Bing Neighbors, MD [facility] [phone] If 7pm- 7am, please page neurology on call as listed in AMION.   MR BRAIN W WO CONTRAST  Result Date:  07/05/2023 CLINICAL DATA:  57 year old female altered mental status. 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 Subsequent encounter. EXAM: MRI HEAD WITHOUT AND WITH CONTRAST TECHNIQUE: Multiplanar, multiecho pulse sequences of the brain and surrounding structures were obtained without and with intravenous contrast. CONTRAST:  7.68mL GADAVIST GADOBUTROL 1 MMOL/ML IV SOLN COMPARISON:  Head CT this morning.  Brain MRI 01/20/2021. FINDINGS: Brain: Motion artifact on postcontrast imaging despite repeated imaging attempts. There are numerable subcentimeter FLAIR and T2 hyperintense foci scattered throughout the bilateral brain (see series 9), some of the largest of which correspond to the hyperdense foci by CT this morning (including adjacent to the right occipital horn series 9, image 24 measuring 9 mm). None of these are identified on SWI with susceptibility. However, some are mildly T1 hyperintense (such as in the left anterior frontal lobe series 7, image 16). But nearly all of the lesions are conspicuous on diffusion imaging, some are restricted and some have ring like morphology (series 13, image 28). None of the lesions are very apparent on motion degraded axial postcontrast imaging. But a few of them do appear to be enhancing on coronal postcontrast imaging in the posterior left hemisphere (series 21). Furthermore, when comparing pre and postcontrast sagittal T1 weighted imaging, it is suspected that many of the small lesions do abnormally enhance (see series 22). No associated vasogenic edema. And no significant intracranial mass effect. No dural thickening or enhancement. No superimposed restricted diffusion which is strongly suggestive of acute infarction. No ventriculomegaly, extra-axial collection. Cervicomedullary junction and pituitary are within normal limits. Vascular: Major  intracranial vascular flow voids are stable. Following contrast the major dural venous sinuses are enhancing and appear to be patent. Skull and upper cervical spine: Thick left superior frontal bone metastasis was better demonstrated by CT this morning, decreased T2 signal there is new since the 2022 MRI. No destructive osseous lesion is identified. Sinuses/Orbits: Stable, negative. Other: Mastoids remain well aerated. Negative visible scalp and face. IMPRESSION: 1. Innumerable subcentimeter brain lesions which are new since 2022. Although most are difficult to identify on motion degraded post-contrast images, the constellation of MRI findings is more suggestive of brain metastases than embolic infarcts or other differential considerations. And it is possible that most of the lesions are treated metastases, with no associated vasogenic edema or mass effect. A short interval repeat MRI in 2 months may be valuable. 2. Progressed skull metastases since 2022 but no destructive osseous lesion is identified. Electronically Signed   By: Rexene Edison  Margo Aye M.D.   On: 07/05/2023 13:24   CT HEAD WO CONTRAST ( )  Result Date: 07/05/2023 CLINICAL DATA:  57 year old female altered mental status. Possible trace hemorrhage layering in the right occipital horn on head CT 0055 hours today., and small left anterior frontal lobe hyperdensity also. Metastatic lung cancer. EXAM: CT HEAD WITHOUT CONTRAST TECHNIQUE: Contiguous axial images were obtained from the base of the skull through the vertex without intravenous contrast. RADIATION DOSE REDUCTION: This exam was performed according to the departmental dose-optimization program which includes automated exposure control, adjustment of the mA and/or kV according to patient size and/or use of iterative reconstruction technique. COMPARISON:  Head CT 0055 hours today.  Brain MRI 01/20/2021. FINDINGS: Brain: Unchanged small 5 mm hyperdense foci in both the left anterior frontal and right  occipital lobes both visible on series 2, image 13 now. No associated edema or mass effect by CT. The right occipital lesion now is seen to be separate from the occipital horn which is on series 2, image 15. These appear to be parenchymal. Furthermore, there is a subtle 3rd hyperdense lesion in the right anterior inferior temporal lobe sagittal image 21. And possibly additional punctate lesions such as the left frontal lobe on series 2, image 17. All of these are new since 09/16/2022. No midline shift or intracranial mass effect. No ventriculomegaly. No extra-axial blood identified. No cortically based acute infarct identified. Normal basilar cisterns. Vascular: No suspicious intracranial vascular hyperdensity. Skull: Abnormal left frontal bone appears infiltrated with abnormal sclerosis and periosteal reaction on series 3, image 51, new since 09/16/2022. This is compatible with skull metastasis. No superimposed skull fracture is identified. No lytic bone lesion is identified. Sinuses/Orbits: Visualized paranasal sinuses and mastoids are stable and well aerated. Other: Visualized orbits and scalp soft tissues are within normal limits. IMPRESSION: 1. 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 2. At least 3 subcentimeter hyperdense foci in the bilateral brain are more likely small hyperdense Brain Metastases than areas of intracranial blood. Brain MRI without and with contrast should be confirmatory and could be compared to 01/20/2021. 3. No associated intracranial mass effect.  No cerebral edema by CT. Electronically Signed   By: Odessa Fleming M.D.   On: 07/05/2023 07:54   CT CHEST ABDOMEN PELVIS W CONTRAST  Result Date: 07/05/2023 CLINICAL DATA:  Sepsis.  History of metastatic lung cancer. EXAM: CT CHEST, ABDOMEN, AND PELVIS WITH CONTRAST TECHNIQUE: Multidetector CT imaging of the chest, abdomen and pelvis was performed following the standard protocol during bolus administration of  intravenous contrast. RADIATION DOSE REDUCTION: This exam was performed according to the departmental dose-optimization program which includes automated exposure control, adjustment of the mA and/or kV according to patient size and/or use of iterative reconstruction technique. CONTRAST:  OMNIPAQUE IOHEXOL 300 MG/ML  SOLN COMPARISON:  CT chest abdomen pelvis 04/19/2023 FINDINGS: CT CHEST FINDINGS Cardiovascular: Right chest wall accessed Port-A-Cath with tip at the superior cavoatrial junction. Normal heart size. No significant pericardial effusion. The thoracic aorta is normal in caliber. No atherosclerotic plaque of the thoracic aorta. No coronary artery calcifications. Mediastinum/Nodes: Stable 1 cm subcarinal lymph node. No enlarged mediastinal, hilar, or axillary lymph nodes. Trachea and esophagus demonstrate no significant findings. Tiny hiatal hernia. Subcentimeter hypodensity of the right thyroid gland-no further follow-up indicated. Lungs/Pleura: Right upper lobe and lower lobe patchy airspace opacities. Stable left lower lobe 8 x 7 mm subpleural nodule (4:50). No pulmonary mass. No pleural effusion. No pneumothorax. Musculoskeletal: No chest  wall abnormality. Chronic diffuse axial and appendicular sclerotic osseous lesions. No acute displaced fracture. Multilevel degenerative changes of the spine. CT ABDOMEN PELVIS FINDINGS Hepatobiliary: Vague hypodensity along the falciform ligament likely focal fatty infiltration. No gallstones, gallbladder wall thickening, or pericholecystic fluid. No biliary dilatation. Pancreas: No focal lesion. Normal pancreatic contour. No surrounding inflammatory changes. No main pancreatic ductal dilatation. Spleen: Normal in size. Slightly more conspicuous splenic hypodensities measuring of 2 cm (2:53). Adrenals/Urinary Tract: Stable left adrenal gland nodules measuring of to 2.4 x 1.2 cm. Stable right adrenal gland nodules measuring up to 4.6 x 1.6 cm. Bilateral kidneys  enhance symmetrically. No hydronephrosis. No hydroureter. The urinary bladder is unremarkable. Stomach/Bowel: Stomach is within normal limits. No evidence of bowel wall thickening or dilatation. Appendix appears normal. Vascular/Lymphatic: No abdominal aorta or iliac aneurysm. Mild atherosclerotic plaque of the aorta and its branches. No abdominal, pelvic, or inguinal lymphadenopathy. Reproductive: Uterus and bilateral adnexa are unremarkable. Other: No intraperitoneal free fluid. No intraperitoneal free gas. No organized fluid collection. Musculoskeletal: No abdominal wall hernia or abnormality. Chronic diffuse axial and appendicular sclerotic osseous lesions. No acute displaced fracture. Six lumbar vertebral bodies with sacralization of the L6 level. Grade 1 anterolisthesis L4 on L5. Mild retrolisthesis of L1 on L2. Bilateral intramedullary nail fixation of the femurs. Persistent periprosthetic lucency along the right femoral shaft surgical hardware. IMPRESSION: 1. Multifocal right lung pneumonia versus aspiration pneumonia. Underlying malignancy not fully excluded. 2. Stable left lower lobe 8 x 7 mm subpleural nodule. 3. Stable indeterminate bilateral adrenal gland nodules. 4. Stable indeterminate splenic hypodensities. 5. Chronic diffuse axial and appendicular sclerotic osseous metastases. Electronically Signed   By: Tish Frederickson M.D.   On: 07/05/2023 02:23   CT HEAD WO CONTRAST ( )  Result Date: 07/05/2023 CLINICAL DATA:  Mental status change, unknown cause Pt discharged yesterday after rod placement in L leg - pt altered since d/c yesterday. EXAM: CT HEAD WITHOUT CONTRAST TECHNIQUE: Contiguous axial images were obtained from the base of the skull through the vertex without intravenous contrast. RADIATION DOSE REDUCTION: This exam was performed according to the departmental dose-optimization program which includes automated exposure control, adjustment of the mA and/or kV according to patient size  and/or use of iterative reconstruction technique. COMPARISON:  CT head 09/16/2022 FINDINGS: Brain: No evidence of large-territorial acute infarction. No definite parenchymal hemorrhage. No mass lesion. No definite extra-axial collection. A 3 mm hyperdensity in the region of the posterior horn of the right lateral ventricle. Question vague 4 mm hyperdensity versus artifact within the left frontal lobe (3:14). No mass effect or midline shift. No hydrocephalus. Basilar cisterns are patent. Vascular: No hyperdense vessel. Skull: No acute fracture or focal lesion. Sinuses/Orbits: Paranasal sinuses and mastoid air cells are clear. The orbits are unremarkable. Other: None. IMPRESSION: 1. A 3 mm hyperdensity in the region of the posterior horn of the right lateral ventricle. Indeterminate etiology with hemorrhage not excluded. Recommend follow-up CT in 4-6 hours to evaluate for stability. 2. Question vague 4 mm hyperdensity versus artifact within the left frontal lobe. Recommended attention on follow-up Electronically Signed   By: Tish Frederickson M.D.   On: 07/05/2023 02:01   DG Chest 1 View  Result Date: 07/02/2023 CLINICAL DATA:  Tachycardia EXAM: CHEST  1 VIEW COMPARISON:  None Available. FINDINGS: Abnormal mediastinal silhouette, compatible with known adenopathy. New somewhat nodular opacity in the right midlung. No visible pleural effusions or pneumothorax. Polyarticular degenerative change. IMPRESSION: 1. New somewhat nodular opacity in the right midlung which  could represent progressive metastatic disease, treatment change, and/or infection. CT of the chest with contrast could further evaluate if clinically warranted 2. Abnormal mediastinal silhouette, compatible with known lymphadenopathy. Electronically Signed   By: Feliberto Harts M.D.   On: 07/02/2023 13:02   DG HIP UNILAT WITH PELVIS 2-3 VIEWS LEFT  Result Date: 06/30/2023 CLINICAL DATA:  Left hip surgery.  Intraoperative fluoroscopy. EXAM: DG HIP  (WITH OR WITHOUT PELVIS) 2-3V LEFT COMPARISON:  Left femur radiographs 04/20/2023 FINDINGS: Images were performed intraoperatively without the presence of a radiologist. Interval left femoral long intramedullary nail fixation, spanning the multiple previously seen sclerotic left femoral bone diaphyses. No hardware complication is seen. Total fluoroscopy images: 4 Total fluoroscopy time: 120 seconds Total dose: Radiation Exposure Index (as provided by the fluoroscopic device): 20.73 mGy air Kerma Please see intraoperative findings for further detail. IMPRESSION: Intraoperative fluoroscopy for left femoral long intramedullary nail fixation. Electronically Signed   By: Neita Garnet M.D.   On: 06/30/2023 16:44   DG C-Arm 1-60 Min-No Report  Result Date: 06/30/2023 Fluoroscopy was utilized by the requesting physician.  No radiographic interpretation.   DG C-Arm 1-60 Min-No Report  Result Date: 06/30/2023 Fluoroscopy was utilized by the requesting physician.  No radiographic interpretation.      Labs: BNP (last 3 results) No results for input(s): "BNP" in the last 8760 hours. Basic Metabolic Panel: Recent Labs  Lab 07/04/23 2310 07/05/23 0416 07/06/23 0328  NA 130* 132* 134*  K 3.6 3.4* 3.7  CL 97* 101 102  CO2 20* 23 22  GLUCOSE 96 127* 168*  BUN 13 9 11   CREATININE 0.56 0.44 0.54  CALCIUM 8.6* 8.2* 8.2*   Liver Function Tests: Recent Labs  Lab 07/04/23 2310 07/05/23 0416  AST 20 16  ALT 12 10  ALKPHOS 116 99  BILITOT 1.0 1.0  PROT 7.0 6.1*  ALBUMIN 2.8* 2.4*   No results for input(s): "LIPASE", "AMYLASE" in the last 168 hours. No results for input(s): "AMMONIA" in the last 168 hours. CBC: Recent Labs  Lab 07/02/23 0903 07/04/23 2310 07/05/23 0416 07/06/23 0328  WBC 10.4 11.6* 10.0 9.5  NEUTROABS  --  7.5 7.6  --   HGB 9.9* 10.2* 8.6* 8.7*  HCT 30.4* 31.6* 27.0* 27.8*  MCV 86.1 86.1 87.7 87.7  PLT 217 278 222 223   Cardiac Enzymes: No results for input(s):  "CKTOTAL", "CKMB", "CKMBINDEX", "TROPONINI" in the last 168 hours. BNP: Invalid input(s): "POCBNP" CBG: No results for input(s): "GLUCAP" in the last 168 hours. D-Dimer No results for input(s): "DDIMER" in the last 72 hours. Hgb A1c No results for input(s): "HGBA1C" in the last 72 hours. Lipid Profile No results for input(s): "CHOL", "HDL", "LDLCALC", "TRIG", "CHOLHDL", "LDLDIRECT" in the last 72 hours. Thyroid function studies No results for input(s): "TSH", "T4TOTAL", "T3FREE", "THYROIDAB" in the last 72 hours.  Invalid input(s): "FREET3" Anemia work up No results for input(s): "VITAMINB12", "FOLATE", "FERRITIN", "TIBC", "IRON", "RETICCTPCT" in the last 72 hours. Urinalysis    Component Value Date/Time   COLORURINE YELLOW (A) 07/05/2023 0858   APPEARANCEUR CLEAR (A) 07/05/2023 0858   APPEARANCEUR Clear 04/25/2018 0925   LABSPEC 1.021 07/05/2023 0858   PHURINE 7.0 07/05/2023 0858   GLUCOSEU NEGATIVE 07/05/2023 0858   HGBUR NEGATIVE 07/05/2023 0858   BILIRUBINUR NEGATIVE 07/05/2023 0858   BILIRUBINUR Negative 04/25/2018 0925   KETONESUR NEGATIVE 07/05/2023 0858   PROTEINUR NEGATIVE 07/05/2023 0858   UROBILINOGEN 0.2 12/26/2013 1525   NITRITE NEGATIVE 07/05/2023 8295  LEUKOCYTESUR NEGATIVE 07/05/2023 0858   Sepsis Labs Recent Labs  Lab 07/02/23 0903 07/04/23 2310 07/05/23 0416 07/06/23 0328  WBC 10.4 11.6* 10.0 9.5   Microbiology Recent Results (from the past 240 hour(s))  Resp panel by RT-PCR (RSV, Flu A&B, Covid) Anterior Nasal Swab     Status: None   Collection Time: 07/04/23 11:10 PM   Specimen: Anterior Nasal Swab  Result Value Ref Range Status   SARS Coronavirus 2 by RT PCR NEGATIVE NEGATIVE Final    Comment: (NOTE) SARS-CoV-2 target nucleic acids are NOT DETECTED.  The SARS-CoV-2 RNA is generally detectable in upper respiratory specimens during the acute phase of infection. The lowest concentration of SARS-CoV-2 viral copies this assay can detect is 138  copies/mL. A negative result does not preclude SARS-Cov-2 infection and should not be used as the sole basis for treatment or other patient management decisions. A negative result may occur with  improper specimen collection/handling, submission of specimen other than nasopharyngeal swab, presence of viral mutation(s) within the areas targeted by this assay, and inadequate number of viral copies(<138 copies/mL). A negative result must be combined with clinical observations, patient history, and epidemiological information. The expected result is Negative.  Fact Sheet for Patients:  BloggerCourse.com  Fact Sheet for Healthcare Providers:  SeriousBroker.it  This test is no t yet approved or cleared by the [place] FDA and  has been authorized for detection and/or diagnosis of SARS-CoV-2 by FDA under an Emergency Use Authorization (EUA). This EUA will remain  in effect (meaning this test can be used) for the duration of the COVID-19 declaration under Section 564(b)(1) of the Act, 21 U.S.C.section 360bbb-3(b)(1), unless the authorization is terminated  or revoked sooner.       Influenza A by PCR NEGATIVE NEGATIVE Final   Influenza B by PCR NEGATIVE NEGATIVE Final    Comment: (NOTE) The Xpert Xpress SARS-CoV-2/FLU/RSV plus assay is intended as an aid in the diagnosis of influenza from Nasopharyngeal swab specimens and should not be used as a sole basis for treatment. Nasal washings and aspirates are unacceptable for Xpert Xpress SARS-CoV-2/FLU/RSV testing.  Fact Sheet for Patients: BloggerCourse.com  Fact Sheet for Healthcare Providers: SeriousBroker.it  This test is not yet approved or cleared by the [place] FDA and has been authorized for detection and/or diagnosis of SARS-CoV-2 by FDA under an Emergency Use Authorization (EUA). This EUA will remain in effect (meaning  this test can be used) for the duration of the COVID-19 declaration under Section 564(b)(1) of the Act, 21 U.S.C. section 360bbb-3(b)(1), unless the authorization is terminated or revoked.     Resp Syncytial Virus by PCR NEGATIVE NEGATIVE Final    Comment: (NOTE) Fact Sheet for Patients: BloggerCourse.com  Fact Sheet for Healthcare Providers: SeriousBroker.it  This test is not yet approved or cleared by the [place] FDA and has been authorized for detection and/or diagnosis of SARS-CoV-2 by FDA under an Emergency Use Authorization (EUA). This EUA will remain in effect (meaning this test can be used) for the duration of the COVID-19 declaration under Section 564(b)(1) of the Act, 21 U.S.C. section 360bbb-3(b)(1), unless the authorization is terminated or revoked.  Performed at [facility], [address] [place], [place] [address]   Blood Culture (routine x 2)     Status: None   Collection Time: 07/04/23 11:10 PM   Specimen: BLOOD  Result Value Ref Range Status   Specimen Description BLOOD PORTA CATH  Final   Special Requests   Final  BOTTLES DRAWN AEROBIC AND ANAEROBIC Blood Culture adequate volume   Culture   Final    NO GROWTH 5 DAYS Performed at [facility], [address] [place], [place] [address]    Report Status 07/09/2023 FINAL  Final  Blood Culture (routine x 2)     Status: None   Collection Time: 07/04/23 11:10 PM   Specimen: BLOOD  Result Value Ref Range Status   Specimen Description BLOOD PORTA CATH  Final   Special Requests   Final    BOTTLES DRAWN AEROBIC AND ANAEROBIC Blood Culture adequate volume   Culture   Final    NO GROWTH 5 DAYS Performed at [facility], [address], [place], [place] [address]    Report Status 07/09/2023 FINAL  Final     Total time spend on discharging this patient, including the last patient exam, discussing the hospital stay,  instructions for ongoing care as it relates to all pertinent caregivers, as well as preparing the medical discharge records, prescriptions, and/or referrals as applicable, is 45 minutes.    Darlin Priestly, MD  [facility] 07/09/2023, 8:32 AM

## 2023-07-11 ENCOUNTER — Other Ambulatory Visit: Payer: Self-pay

## 2023-07-11 ENCOUNTER — Other Ambulatory Visit: Payer: Self-pay | Admitting: *Deleted

## 2023-07-11 ENCOUNTER — Ambulatory Visit
Admission: RE | Admit: 2023-07-11 | Discharge: 2023-07-11 | Disposition: A | Discharge status: Home / Self Care | Payer: 59 | Source: Ambulatory Visit | Attending: Radiation Oncology | Admitting: Radiation Oncology

## 2023-07-11 DIAGNOSIS — Z510 Encounter for antineoplastic radiation therapy: Secondary | ICD-10-CM | POA: Diagnosis not present

## 2023-07-11 DIAGNOSIS — Z87891 Personal history of nicotine dependence: Secondary | ICD-10-CM | POA: Diagnosis not present

## 2023-07-11 DIAGNOSIS — C7931 Secondary malignant neoplasm of brain: Secondary | ICD-10-CM | POA: Diagnosis not present

## 2023-07-11 DIAGNOSIS — C3480 Malignant neoplasm of overlapping sites of unspecified bronchus and lung: Secondary | ICD-10-CM | POA: Diagnosis not present

## 2023-07-11 LAB — RAD ONC ARIA SESSION SUMMARY
Course Elapsed Days: 0
Plan Fractions Treated to Date: 1
Plan Prescribed Dose Per Fraction: 3 Gy
Plan Total Fractions Prescribed: 10
Plan Total Prescribed Dose: 30 Gy
Reference Point Dosage Given to Date: 3 Gy
Reference Point Session Dosage Given: 3 Gy
Session Number: 1

## 2023-07-11 MED ORDER — LIDOCAINE 5 % EX PTCH: 1.0000 | MEDICATED_PATCH | CUTANEOUS | 1 refills | Status: DC

## 2023-07-12 ENCOUNTER — Ambulatory Visit
Admission: RE | Admit: 2023-07-12 | Discharge: 2023-07-12 | Disposition: A | Discharge status: Home / Self Care | Payer: 59 | Source: Ambulatory Visit | Attending: Radiation Oncology | Admitting: Radiation Oncology

## 2023-07-12 ENCOUNTER — Other Ambulatory Visit: Payer: Self-pay

## 2023-07-12 DIAGNOSIS — Z510 Encounter for antineoplastic radiation therapy: Secondary | ICD-10-CM | POA: Diagnosis not present

## 2023-07-12 LAB — RAD ONC ARIA SESSION SUMMARY
Course Elapsed Days: 1
Plan Fractions Treated to Date: 2
Plan Prescribed Dose Per Fraction: 3 Gy
Plan Total Fractions Prescribed: 10
Plan Total Prescribed Dose: 30 Gy
Reference Point Dosage Given to Date: 6 Gy
Reference Point Session Dosage Given: 3 Gy
Session Number: 2

## 2023-07-13 ENCOUNTER — Ambulatory Visit
Admission: RE | Admit: 2023-07-13 | Discharge: 2023-07-13 | Disposition: A | Discharge status: Home / Self Care | Payer: 59 | Source: Ambulatory Visit | Attending: Radiation Oncology | Admitting: Radiation Oncology

## 2023-07-13 ENCOUNTER — Other Ambulatory Visit: Payer: Self-pay

## 2023-07-13 DIAGNOSIS — Z604 Social exclusion and rejection: Secondary | ICD-10-CM | POA: Diagnosis not present

## 2023-07-13 DIAGNOSIS — K5790 Diverticulosis of intestine, part unspecified, without perforation or abscess without bleeding: Secondary | ICD-10-CM | POA: Diagnosis not present

## 2023-07-13 DIAGNOSIS — K523 Indeterminate colitis: Secondary | ICD-10-CM | POA: Diagnosis not present

## 2023-07-13 DIAGNOSIS — Z8701 Personal history of pneumonia (recurrent): Secondary | ICD-10-CM | POA: Diagnosis not present

## 2023-07-13 DIAGNOSIS — E785 Hyperlipidemia, unspecified: Secondary | ICD-10-CM | POA: Diagnosis not present

## 2023-07-13 DIAGNOSIS — Z860100 Personal history of colon polyps, unspecified: Secondary | ICD-10-CM | POA: Diagnosis not present

## 2023-07-13 DIAGNOSIS — C770 Secondary and unspecified malignant neoplasm of lymph nodes of head, face and neck: Secondary | ICD-10-CM | POA: Diagnosis not present

## 2023-07-13 DIAGNOSIS — I10 Essential (primary) hypertension: Secondary | ICD-10-CM | POA: Diagnosis not present

## 2023-07-13 DIAGNOSIS — R32 Unspecified urinary incontinence: Secondary | ICD-10-CM | POA: Diagnosis not present

## 2023-07-13 DIAGNOSIS — C7951 Secondary malignant neoplasm of bone: Secondary | ICD-10-CM | POA: Diagnosis not present

## 2023-07-13 DIAGNOSIS — K219 Gastro-esophageal reflux disease without esophagitis: Secondary | ICD-10-CM | POA: Diagnosis not present

## 2023-07-13 DIAGNOSIS — M84552D Pathological fracture in neoplastic disease, left femur, subsequent encounter for fracture with routine healing: Secondary | ICD-10-CM | POA: Diagnosis not present

## 2023-07-13 DIAGNOSIS — C7970 Secondary malignant neoplasm of unspecified adrenal gland: Secondary | ICD-10-CM | POA: Diagnosis not present

## 2023-07-13 DIAGNOSIS — K5909 Other constipation: Secondary | ICD-10-CM | POA: Diagnosis not present

## 2023-07-13 DIAGNOSIS — M5416 Radiculopathy, lumbar region: Secondary | ICD-10-CM | POA: Diagnosis not present

## 2023-07-13 DIAGNOSIS — F411 Generalized anxiety disorder: Secondary | ICD-10-CM | POA: Diagnosis not present

## 2023-07-13 DIAGNOSIS — G893 Neoplasm related pain (acute) (chronic): Secondary | ICD-10-CM | POA: Diagnosis not present

## 2023-07-13 DIAGNOSIS — Z87891 Personal history of nicotine dependence: Secondary | ICD-10-CM | POA: Diagnosis not present

## 2023-07-13 DIAGNOSIS — Z510 Encounter for antineoplastic radiation therapy: Secondary | ICD-10-CM | POA: Diagnosis not present

## 2023-07-13 DIAGNOSIS — C3491 Malignant neoplasm of unspecified part of right bronchus or lung: Secondary | ICD-10-CM | POA: Diagnosis not present

## 2023-07-13 DIAGNOSIS — F32A Depression, unspecified: Secondary | ICD-10-CM | POA: Diagnosis not present

## 2023-07-13 DIAGNOSIS — Z556 Problems related to health literacy: Secondary | ICD-10-CM | POA: Diagnosis not present

## 2023-07-13 LAB — RAD ONC ARIA SESSION SUMMARY
Course Elapsed Days: 2
Plan Fractions Treated to Date: 3
Plan Prescribed Dose Per Fraction: 3 Gy
Plan Total Fractions Prescribed: 10
Plan Total Prescribed Dose: 30 Gy
Reference Point Dosage Given to Date: 9 Gy
Reference Point Session Dosage Given: 3 Gy
Session Number: 3

## 2023-07-14 ENCOUNTER — Ambulatory Visit
Admission: RE | Admit: 2023-07-14 | Discharge: 2023-07-14 | Disposition: A | Discharge status: Home / Self Care | Payer: 59 | Source: Ambulatory Visit | Attending: Radiation Oncology | Admitting: Radiation Oncology

## 2023-07-14 ENCOUNTER — Inpatient Hospital Stay (HOSPITAL_BASED_OUTPATIENT_CLINIC_OR_DEPARTMENT_OTHER): Payer: 59 | Admitting: Hospice and Palliative Medicine

## 2023-07-14 ENCOUNTER — Inpatient Hospital Stay: Payer: 59

## 2023-07-14 ENCOUNTER — Encounter: Payer: Self-pay | Admitting: *Deleted

## 2023-07-14 ENCOUNTER — Other Ambulatory Visit: Payer: Self-pay

## 2023-07-14 VITALS — BP 119/104 | HR 106

## 2023-07-14 VITALS — BP 136/103 | HR 94

## 2023-07-14 DIAGNOSIS — E860 Dehydration: Secondary | ICD-10-CM

## 2023-07-14 DIAGNOSIS — Z510 Encounter for antineoplastic radiation therapy: Secondary | ICD-10-CM | POA: Diagnosis not present

## 2023-07-14 DIAGNOSIS — C3491 Malignant neoplasm of unspecified part of right bronchus or lung: Secondary | ICD-10-CM | POA: Diagnosis not present

## 2023-07-14 LAB — RAD ONC ARIA SESSION SUMMARY
Course Elapsed Days: 3
Plan Fractions Treated to Date: 4
Plan Prescribed Dose Per Fraction: 3 Gy
Plan Total Fractions Prescribed: 10
Plan Total Prescribed Dose: 30 Gy
Reference Point Dosage Given to Date: 12 Gy
Reference Point Session Dosage Given: 3 Gy
Session Number: 4

## 2023-07-14 LAB — CMP (CANCER CENTER ONLY)
ALT: 33 U/L (ref 0–44)
AST: 25 U/L (ref 15–41)
Albumin: 3.7 g/dL (ref 3.5–5.0)
Alkaline Phosphatase: 202 U/L — ABNORMAL HIGH (ref 38–126)
Anion gap: 13 (ref 5–15)
BUN: 27 mg/dL — ABNORMAL HIGH (ref 6–20)
CO2: 23 mmol/L (ref 22–32)
Calcium: 9.2 mg/dL (ref 8.9–10.3)
Chloride: 93 mmol/L — ABNORMAL LOW (ref 98–111)
Creatinine: 0.54 mg/dL (ref 0.44–1.00)
GFR, Estimated: >60 mL/min (ref >60)
Glucose, Bld: 129 mg/dL — ABNORMAL HIGH (ref 70–99)
Potassium: 5.1 mmol/L (ref 3.5–5.1)
Sodium: 129 mmol/L — ABNORMAL LOW (ref 135–145)
Total Bilirubin: 0.8 mg/dL (ref 0.3–1.2)
Total Protein: 7.7 g/dL (ref 6.5–8.1)

## 2023-07-14 LAB — CBC WITH DIFFERENTIAL (CANCER CENTER ONLY)
Abs Immature Granulocytes: 0.26 10*3/uL — ABNORMAL HIGH (ref 0.00–0.07)
Basophils Absolute: 0 10*3/uL (ref 0.0–0.1)
Basophils Relative: 0 %
Eosinophils Absolute: 0.1 10*3/uL (ref 0.0–0.5)
Eosinophils Relative: 0 %
HCT: 40.6 % (ref 36.0–46.0)
Hemoglobin: 13.2 g/dL (ref 12.0–15.0)
Immature Granulocytes: 1 %
Lymphocytes Relative: 5 %
Lymphs Abs: 0.9 10*3/uL (ref 0.7–4.0)
MCH: 28.1 pg (ref 26.0–34.0)
MCHC: 32.5 g/dL (ref 30.0–36.0)
MCV: 86.4 fL (ref 80.0–100.0)
Monocytes Absolute: 0.7 10*3/uL (ref 0.1–1.0)
Monocytes Relative: 4 %
Neutro Abs: 17 10*3/uL — ABNORMAL HIGH (ref 1.7–7.7)
Neutrophils Relative %: 90 %
Platelet Count: 373 10*3/uL (ref 150–400)
RBC: 4.7 MIL/uL (ref 3.87–5.11)
RDW: 16.3 % — ABNORMAL HIGH (ref 11.5–15.5)
WBC Count: 19 10*3/uL — ABNORMAL HIGH (ref 4.0–10.5)
nRBC: 0 % (ref 0.0–0.2)

## 2023-07-14 MED ORDER — SODIUM CHLORIDE 0.9 % IV SOLN
INTRAVENOUS | Status: DC
  Filled 2023-07-14 (×2): qty 250

## 2023-07-14 MED ORDER — HEPARIN SOD (PORK) LOCK FLUSH 100 UNIT/ML IV SOLN
500.0000 [IU] | Freq: Once | INTRAVENOUS | Status: AC
Start: 2023-07-14 — End: 2023-07-14
  Administered 2023-07-14: 500 [IU] via INTRAVENOUS
  Filled 2023-07-14: qty 5

## 2023-07-14 MED ORDER — SODIUM CHLORIDE 0.9% FLUSH
10.0000 mL | Freq: Once | INTRAVENOUS | Status: AC
  Administered 2023-07-14: 10 mL via INTRAVENOUS
  Filled 2023-07-14: qty 10

## 2023-07-14 MED ORDER — HYDROCORTISONE 20 MG PO TABS: 20.0000 mg | ORAL_TABLET | Freq: Every morning | ORAL | 3 refills | Status: DC

## 2023-07-14 MED ORDER — HYDROCORTISONE 10 MG PO TABS: 10.0000 mg | ORAL_TABLET | Freq: Every day | ORAL | 3 refills | Status: DC

## 2023-07-14 MED ORDER — CITALOPRAM HYDROBROMIDE 10 MG PO TABS: 10.0000 mg | ORAL_TABLET | Freq: Every day | ORAL | 3 refills | Status: DC

## 2023-07-14 MED ORDER — LIDOCAINE 5 % EX PTCH: 1.0000 | MEDICATED_PATCH | CUTANEOUS | 1 refills | Status: DC

## 2023-07-14 MED ORDER — HYDROCODONE BIT-HOMATROP MBR 5-1.5 MG/5ML PO SOLN: 5.0000 mL | Freq: Four times a day (QID) | ORAL | 0 refills | Status: DC | PRN

## 2023-07-15 ENCOUNTER — Telehealth: Payer: Self-pay | Admitting: *Deleted

## 2023-07-15 ENCOUNTER — Ambulatory Visit
Admission: RE | Admit: 2023-07-15 | Discharge: 2023-07-15 | Disposition: A | Discharge status: Home / Self Care | Payer: 59 | Source: Ambulatory Visit | Attending: Radiation Oncology | Admitting: Radiation Oncology

## 2023-07-15 ENCOUNTER — Inpatient Hospital Stay: Payer: 59

## 2023-07-15 ENCOUNTER — Other Ambulatory Visit: Payer: Self-pay | Admitting: Oncology

## 2023-07-15 ENCOUNTER — Inpatient Hospital Stay (HOSPITAL_BASED_OUTPATIENT_CLINIC_OR_DEPARTMENT_OTHER): Payer: 59 | Admitting: Hospice and Palliative Medicine

## 2023-07-15 ENCOUNTER — Other Ambulatory Visit: Payer: Self-pay

## 2023-07-15 ENCOUNTER — Encounter: Payer: Self-pay | Admitting: Oncology

## 2023-07-15 ENCOUNTER — Other Ambulatory Visit: Payer: Self-pay | Admitting: *Deleted

## 2023-07-15 ENCOUNTER — Inpatient Hospital Stay: Payer: 59 | Attending: Oncology

## 2023-07-15 VITALS — BP 123/93 | HR 98 | Temp 94.1°F | Resp 22

## 2023-07-15 DIAGNOSIS — C7931 Secondary malignant neoplasm of brain: Secondary | ICD-10-CM | POA: Insufficient documentation

## 2023-07-15 DIAGNOSIS — R6883 Chills (without fever): Secondary | ICD-10-CM

## 2023-07-15 DIAGNOSIS — R112 Nausea with vomiting, unspecified: Secondary | ICD-10-CM | POA: Insufficient documentation

## 2023-07-15 DIAGNOSIS — K219 Gastro-esophageal reflux disease without esophagitis: Secondary | ICD-10-CM | POA: Insufficient documentation

## 2023-07-15 DIAGNOSIS — R531 Weakness: Secondary | ICD-10-CM | POA: Insufficient documentation

## 2023-07-15 DIAGNOSIS — C801 Malignant (primary) neoplasm, unspecified: Secondary | ICD-10-CM | POA: Insufficient documentation

## 2023-07-15 DIAGNOSIS — R5383 Other fatigue: Secondary | ICD-10-CM | POA: Diagnosis not present

## 2023-07-15 DIAGNOSIS — Z79899 Other long term (current) drug therapy: Secondary | ICD-10-CM | POA: Insufficient documentation

## 2023-07-15 DIAGNOSIS — I700 Atherosclerosis of aorta: Secondary | ICD-10-CM | POA: Diagnosis not present

## 2023-07-15 DIAGNOSIS — M4316 Spondylolisthesis, lumbar region: Secondary | ICD-10-CM | POA: Diagnosis not present

## 2023-07-15 DIAGNOSIS — C7800 Secondary malignant neoplasm of unspecified lung: Secondary | ICD-10-CM | POA: Insufficient documentation

## 2023-07-15 DIAGNOSIS — Z9221 Personal history of antineoplastic chemotherapy: Secondary | ICD-10-CM | POA: Diagnosis not present

## 2023-07-15 DIAGNOSIS — Z510 Encounter for antineoplastic radiation therapy: Secondary | ICD-10-CM | POA: Insufficient documentation

## 2023-07-15 DIAGNOSIS — I10 Essential (primary) hypertension: Secondary | ICD-10-CM | POA: Insufficient documentation

## 2023-07-15 DIAGNOSIS — C3490 Malignant neoplasm of unspecified part of unspecified bronchus or lung: Secondary | ICD-10-CM | POA: Insufficient documentation

## 2023-07-15 DIAGNOSIS — Z5112 Encounter for antineoplastic immunotherapy: Secondary | ICD-10-CM | POA: Insufficient documentation

## 2023-07-15 DIAGNOSIS — Z860101 Personal history of adenomatous and serrated colon polyps: Secondary | ICD-10-CM | POA: Diagnosis not present

## 2023-07-15 DIAGNOSIS — Z7952 Long term (current) use of systemic steroids: Secondary | ICD-10-CM | POA: Diagnosis not present

## 2023-07-15 DIAGNOSIS — Z923 Personal history of irradiation: Secondary | ICD-10-CM | POA: Insufficient documentation

## 2023-07-15 DIAGNOSIS — I959 Hypotension, unspecified: Secondary | ICD-10-CM | POA: Insufficient documentation

## 2023-07-15 DIAGNOSIS — C7951 Secondary malignant neoplasm of bone: Secondary | ICD-10-CM | POA: Insufficient documentation

## 2023-07-15 DIAGNOSIS — C3491 Malignant neoplasm of unspecified part of right bronchus or lung: Secondary | ICD-10-CM

## 2023-07-15 DIAGNOSIS — M545 Low back pain, unspecified: Secondary | ICD-10-CM | POA: Diagnosis not present

## 2023-07-15 DIAGNOSIS — J439 Emphysema, unspecified: Secondary | ICD-10-CM | POA: Insufficient documentation

## 2023-07-15 DIAGNOSIS — E785 Hyperlipidemia, unspecified: Secondary | ICD-10-CM | POA: Insufficient documentation

## 2023-07-15 DIAGNOSIS — Z87891 Personal history of nicotine dependence: Secondary | ICD-10-CM | POA: Diagnosis not present

## 2023-07-15 DIAGNOSIS — C7970 Secondary malignant neoplasm of unspecified adrenal gland: Secondary | ICD-10-CM | POA: Insufficient documentation

## 2023-07-15 DIAGNOSIS — G9341 Metabolic encephalopathy: Secondary | ICD-10-CM | POA: Diagnosis not present

## 2023-07-15 DIAGNOSIS — K5900 Constipation, unspecified: Secondary | ICD-10-CM | POA: Diagnosis not present

## 2023-07-15 DIAGNOSIS — E875 Hyperkalemia: Secondary | ICD-10-CM | POA: Diagnosis not present

## 2023-07-15 DIAGNOSIS — G893 Neoplasm related pain (acute) (chronic): Secondary | ICD-10-CM | POA: Insufficient documentation

## 2023-07-15 DIAGNOSIS — E860 Dehydration: Secondary | ICD-10-CM

## 2023-07-15 DIAGNOSIS — C3411 Malignant neoplasm of upper lobe, right bronchus or lung: Secondary | ICD-10-CM | POA: Diagnosis present

## 2023-07-15 DIAGNOSIS — C779 Secondary and unspecified malignant neoplasm of lymph node, unspecified: Secondary | ICD-10-CM | POA: Diagnosis present

## 2023-07-15 DIAGNOSIS — C3480 Malignant neoplasm of overlapping sites of unspecified bronchus and lung: Secondary | ICD-10-CM | POA: Diagnosis not present

## 2023-07-15 LAB — CMP (CANCER CENTER ONLY)
ALT: 33 U/L (ref 0–44)
AST: 24 U/L (ref 15–41)
Albumin: 3.7 g/dL (ref 3.5–5.0)
Alkaline Phosphatase: 202 U/L — ABNORMAL HIGH (ref 38–126)
Anion gap: 13 (ref 5–15)
BUN: 27 mg/dL — ABNORMAL HIGH (ref 6–20)
CO2: 23 mmol/L (ref 22–32)
Calcium: 9.1 mg/dL (ref 8.9–10.3)
Chloride: 92 mmol/L — ABNORMAL LOW (ref 98–111)
Creatinine: 0.56 mg/dL (ref 0.44–1.00)
GFR, Estimated: >60 mL/min (ref >60)
Glucose, Bld: 143 mg/dL — ABNORMAL HIGH (ref 70–99)
Potassium: 5.2 mmol/L — ABNORMAL HIGH (ref 3.5–5.1)
Sodium: 128 mmol/L — ABNORMAL LOW (ref 135–145)
Total Bilirubin: 0.9 mg/dL (ref 0.3–1.2)
Total Protein: 7.5 g/dL (ref 6.5–8.1)

## 2023-07-15 LAB — CBC WITH DIFFERENTIAL (CANCER CENTER ONLY)
Abs Immature Granulocytes: 0.19 10*3/uL — ABNORMAL HIGH (ref 0.00–0.07)
Basophils Absolute: 0 10*3/uL (ref 0.0–0.1)
Basophils Relative: 0 %
Eosinophils Absolute: 0 10*3/uL (ref 0.0–0.5)
Eosinophils Relative: 0 %
HCT: 39.3 % (ref 36.0–46.0)
Hemoglobin: 12.7 g/dL (ref 12.0–15.0)
Immature Granulocytes: 1 %
Lymphocytes Relative: 5 %
Lymphs Abs: 0.7 10*3/uL (ref 0.7–4.0)
MCH: 27.6 pg (ref 26.0–34.0)
MCHC: 32.3 g/dL (ref 30.0–36.0)
MCV: 85.4 fL (ref 80.0–100.0)
Monocytes Absolute: 0.4 10*3/uL (ref 0.1–1.0)
Monocytes Relative: 3 %
Neutro Abs: 13.3 10*3/uL — ABNORMAL HIGH (ref 1.7–7.7)
Neutrophils Relative %: 91 %
Platelet Count: 333 10*3/uL (ref 150–400)
RBC: 4.6 MIL/uL (ref 3.87–5.11)
RDW: 16.4 % — ABNORMAL HIGH (ref 11.5–15.5)
WBC Count: 14.6 10*3/uL — ABNORMAL HIGH (ref 4.0–10.5)
nRBC: 0 % (ref 0.0–0.2)

## 2023-07-15 LAB — RAD ONC ARIA SESSION SUMMARY
Course Elapsed Days: 4
Plan Fractions Treated to Date: 5
Plan Prescribed Dose Per Fraction: 3 Gy
Plan Total Fractions Prescribed: 10
Plan Total Prescribed Dose: 30 Gy
Reference Point Dosage Given to Date: 15 Gy
Reference Point Session Dosage Given: 3 Gy
Session Number: 5

## 2023-07-15 LAB — URINALYSIS, COMPLETE (UACMP) WITH MICROSCOPIC
Bacteria, UA: NONE SEEN
Bilirubin Urine: NEGATIVE
Glucose, UA: NEGATIVE mg/dL
Hgb urine dipstick: NEGATIVE
Ketones, ur: NEGATIVE mg/dL
Leukocytes,Ua: NEGATIVE
Nitrite: NEGATIVE
Protein, ur: NEGATIVE mg/dL
Specific Gravity, Urine: 1.012 (ref 1.005–1.030)
Squamous Epithelial / HPF: 0 /[HPF] (ref 0–5)
pH: 6 (ref 5.0–8.0)

## 2023-07-15 MED ORDER — SODIUM CHLORIDE 0.9% FLUSH
10.0000 mL | Freq: Once | INTRAVENOUS | Status: AC
  Administered 2023-07-15: 10 mL via INTRAVENOUS
  Filled 2023-07-15: qty 10

## 2023-07-15 MED ORDER — SODIUM CHLORIDE 0.9 % IV SOLN
INTRAVENOUS | Status: DC
  Filled 2023-07-15 (×2): qty 250

## 2023-07-15 MED ORDER — HEPARIN SOD (PORK) LOCK FLUSH 100 UNIT/ML IV SOLN
500.0000 [IU] | Freq: Once | INTRAVENOUS | Status: AC
  Administered 2023-07-15: 500 [IU] via INTRAVENOUS
  Filled 2023-07-15: qty 5

## 2023-07-15 MED ORDER — HYDROMORPHONE HCL 1 MG/ML IJ SOLN
0.5000 mg | Freq: Once | INTRAMUSCULAR | Status: AC
  Administered 2023-07-15: 0.5 mg via INTRAVENOUS
  Filled 2023-07-15: qty 1

## 2023-07-15 NOTE — Telephone Encounter (Signed)
Nothing to change with regards to her BP meds. Just ask her to bring home BP readings

## 2023-07-15 NOTE — Telephone Encounter (Signed)
-----   Message from Chriss Czar sent at 07/15/2023 10:53 AM EDT ----- Regarding: NOTICE OF APPROVAL- AETNA NOTICE OF APPROVAL- AETNA for Lidocaine sent to her chart.

## 2023-07-15 NOTE — Telephone Encounter (Signed)
[facility] will [facility] today. Spoke with pt's husband - per spouse, KC has not checked a temp reading on patient today. Husband did not check bp this morning.   If patient is having high fever - she will need to go to ER per Josh, P

## 2023-07-15 NOTE — Telephone Encounter (Signed)
CAll from [facility] stating patient was here for radiation therapy themn came there and patient husband asked her to call us re her BP being 88/56, patient is clammy and has cold chills. I spoke with Theodore Demark in radiation therapy who reports that patient was not like that when she left and even stated that she felt better than yesterday. Please advise what needs to be done. Doe s she need to go to ER or see [facility] here [place] said we can call husband at [phone] or we can call her back [phone]

## 2023-07-15 NOTE — Telephone Encounter (Signed)
Pa submitted for lidocaine patches -  Gina Sanchez (Key: [id number]) - [id number] Lidocaine 5% patches status: PA Response - ApprovedCreated: October 31st, 2024 [id number] Sent: November 1st, 2024

## 2023-07-15 NOTE — Progress Notes (Signed)
1 Liter NS given over an hour, 0.5 mg Dilaudid IV given in clinic per [facility] for 10/10 pain. Urine sample obtained. Pt and husband instructed to go to ED over the weekend if patient starts feeling worse.

## 2023-07-15 NOTE — Progress Notes (Signed)
[facility]pt[facility] at [facility] [phone] [phone]  Patient Care Team: Armando Gang, FNP as PCP - General (Family Medicine) Glory Buff, RN as Oncology Nurse Navigator Carmina Miller, MD as Radiation Oncologist (Radiation Oncology) Vida Rigger, MD as Consulting Physician (Pulmonary Disease) Creig Hines, MD as Consulting Physician (Oncology)   NAME OF PATIENT: Gina Sanchez  [id number]  [birth year]   DATE OF VISIT: 07/15/23  REASON FOR CONSULT: Gina Sanchez is a 57 y.o. 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    INTERVAL HISTORY: Patient hospitalized 07/04/2023 to 07/09/2023 with sepsis from multifocal pneumonia.  She was also started on Keppra for possible seizures.  CT of the head showed significant bilateral metastatic disease, new when compared to previous images.  Patient was started on whole brain radiation.  Patient seen yesterday in [facility] for weakness and fatigue.  Received IV fluids and supportive care.  She was being evaluated at [facility] and we received notification that patient was clammy and hypotensive.  Patient brought over to [facility] for evaluation.  Here, patient says that she does not really feel any different today than she did yesterday.  Feels weak and fatigued.  Felt better after IV fluids yesterday.  Denies fever or chills.  Says that she feels cold and clammy but she says that is not a new problem.  Oral intake is minimal.  Denies fever or chills.  No cough, congestion, shortness of breath, or chest pain.  Denies nausea or vomiting.  Does have constipation.  Denies any urinary symptoms.    PAST MEDICAL HISTORY: Past Medical History:  Diagnosis Date   Abnormal Pap smear of cervix    Anxiety    C. 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   Chronic diarrhea     Closed fracture of distal end of left radius 03/23/2021   Colon polyp    Depression    Diverticulosis    Essential hypertension    Femur fracture, right (HCC) 09/14/2020   GERD (gastroesophageal reflux disease)    Hyperlipidemia    Leukocytosis    Lumbar radiculopathy    Lung cancer (HCC)    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   Metastatic cancer (HCC) 10/2020   lung, bone, lymph node, adrenal   Palpitations    Precordial chest pain    Wrist fracture 03/2021   left    PAST SURGICAL HISTORY:  Past Surgical History:  Procedure Laterality Date   BREAST BIOPSY Right 2017   benign   CERVICAL BIOPSY  W/ LOOP ELECTRODE EXCISION     COLONOSCOPY  06/11/2022   Procedure: COLONOSCOPY;  Surgeon: Toney Reil, MD;  Location: [facility] ENDOSCOPY;  Service: Gastroenterology;;   COLONOSCOPY WITH PROPOFOL N/A 07/03/2015   Procedure: COLONOSCOPY WITH PROPOFOL;  Surgeon: Wallace Cullens, MD;  Location: [facility] ENDOSCOPY;  Service: Gastroenterology;  Laterality: N/A;   COLONOSCOPY WITH PROPOFOL N/A 01/06/2022   Procedure: COLONOSCOPY WITH PROPOFOL;  Surgeon: Toney Reil, MD;  Location: [facility] ENDOSCOPY;  Service: Gastroenterology;  Laterality: N/A;   ESOPHAGOGASTRODUODENOSCOPY (EGD) WITH PROPOFOL N/A 07/03/2015   Procedure: ESOPHAGOGASTRODUODENOSCOPY (EGD) WITH PROPOFOL;  Surgeon: Wallace Cullens, MD;  Location: [facility] ENDOSCOPY;  Service: Gastroenterology;  Laterality: N/A;   FEMUR IM NAIL Left 06/30/2023   Procedure: Left prophylactic intramedullary nailing of impending left subtrochanteric femur fracture;  Surgeon: Christena Flake, MD;  Location: [facility] ORS;  Service: Orthopedics;  Laterality: Left;   FLEXIBLE SIGMOIDOSCOPY N/A 03/12/2022   Procedure: FLEXIBLE SIGMOIDOSCOPY;  Surgeon: Toney Reil, MD;  Location: [facility] ENDOSCOPY;  Service: Gastroenterology;  Laterality: N/A;   FLEXIBLE SIGMOIDOSCOPY N/A 08/17/2022   Procedure: FLEXIBLE SIGMOIDOSCOPY;  Surgeon: Toney Reil, MD;  Location: [facility]   ENDOSCOPY;  Service: Gastroenterology;  Laterality: N/A;   INTRAMEDULLARY (IM) NAIL INTERTROCHANTERIC Right 09/15/2020   Procedure: INTRAMEDULLARY (IM) NAIL INTERTROCHANTRIC;  Surgeon: Christena Flake, MD;  Location: [facility] ORS;  Service: Orthopedics;  Laterality: Right;   IR CV LINE INJECTION  07/14/2022   IR IMAGING GUIDED PORT INSERTION  11/28/2020   LEFT HEART CATH AND CORONARY ANGIOGRAPHY N/A 03/28/2017   Procedure: Left Heart Cath and Coronary Angiography;  Surgeon: Runell Gess, MD;  Location: [facility] INVASIVE CV LAB;  Service: Cardiovascular;  Laterality: N/A;   ORIF WRIST FRACTURE Left 03/31/2021   Procedure: OPEN REDUCTION INTERNAL FIXATION (ORIF) LEFT DISTAL RADIUS FRACTURE.;  Surgeon: Christena Flake, MD;  Location: [facility] ORS;  Service: Orthopedics;  Laterality: Left;    HEMATOLOGY/ONCOLOGY HISTORY:  Oncology History  Metastatic lung cancer (metastasis from lung to other site) [facility])  10/19/2020 Initial Diagnosis   Metastatic lung cancer (metastasis from lung to other site) [facility])   10/19/2020 Cancer Staging   Staging form: Lung, AJCC 8th Edition - Clinical: Stage IV (cT1, cN3, pM1) - Signed by Creig Hines, MD on 10/19/2020   10/27/2020 - 02/12/2022 Chemotherapy   Patient is on Treatment Plan : LUNG NSCLC Pemetrexed (Alimta) / Carboplatin q21d x 1 cycles     06/08/2022 - 09/15/2022 Chemotherapy   Patient is on Treatment Plan : LUNG NSCLC Pemetrexed + Carboplatin q21d x 4 Cycles     02/17/2023 -  Chemotherapy   Patient is on Treatment Plan : LUNG NSCLC Carboplatin + Paclitaxel + Bevacizumab q21d       ALLERGIES:  is allergic to factive [gemifloxacin].  MEDICATIONS:  Current Outpatient Medications  Medication Sig Dispense Refill   acetaminophen (TYLENOL) 500 MG tablet Take 500 mg by mouth every 6 (six) hours as needed.     ALPRAZolam (XANAX) 0.5 MG tablet TAKE 1 TABLET BY MOUTH DAILY AS NEEDED FOR ANXIETY. 30 tablet 0   citalopram (CELEXA) 10 MG tablet Take 1 tablet (10 mg total) by  mouth daily. 30 tablet 3   dexamethasone (DECADRON) 4 MG tablet Take 1 tablet (4 mg total) by mouth 2 (two) times daily. 60 tablet 0   fentaNYL (DURAGESIC) 25 MCG/HR Place 1 patch onto the skin every 3 (three) days. 10 patch 0   HYDROcodone bit-homatropine (HYCODAN) 5-1.5 MG/5ML syrup Take 5 mLs by mouth every 6 (six) hours as needed for cough. 240 mL 0   hydrocortisone (CORTEF) 10 MG tablet Take 1 tablet (10 mg total) by mouth at bedtime. 30 tablet 3   hydrocortisone (CORTEF) 20 MG tablet Take 1 tablet (20 mg total) by mouth every morning. 30 tablet 3   HYDROmorphone (DILAUDID) 2 MG tablet Take 1-2 tablets (2-4 mg total) by mouth every 4 (four) hours as needed for severe pain. 120 tablet 0   HYDROmorphone (DILAUDID) 2 MG tablet Take 1-2 tablets (2-4 mg total) by mouth every 4 (four) hours as needed for severe pain (pain score 7-10) or moderate pain (pain score 4-6). 20 tablet 0   levETIRAcetam (KEPPRA) 500 MG tablet Take 1 tablet (500 mg total) by mouth 2 (two) times daily. 60 tablet 2   lidocaine (LIDODERM) 5 %  Place 1 patch onto the skin daily. Remove & Discard patch within 12 hours or as directed by MD 30 patch 1   metoprolol succinate (TOPROL-XL) 25 MG 24 hr tablet Take 1 tablet (25 mg total) by mouth daily. 90 tablet 0   ondansetron (ZOFRAN) 4 MG tablet TAKE 1 TABLET BY MOUTH EVERY 8 HOURS AS NEEDED FOR NAUSEA AND VOMITING (Patient taking differently: Take 4 mg by mouth every 8 (eight) hours as needed for nausea or vomiting. TAKE 1 TABLET BY MOUTH EVERY 8 HOURS AS NEEDED FOR NAUSEA AND VOMITING) 20 tablet 1   prochlorperazine (COMPAZINE) 10 MG tablet Take 1 tablet (10 mg total) by mouth every 6 (six) hours as needed for nausea or vomiting. 30 tablet 1   Vedolizumab (ENTYVIO) 108 MG/0.68ML SOPN Inject 1 Pen into the skin every 14 (fourteen) days. (Patient taking differently: Inject 1 Pen into the skin every 30 (thirty) days.) 1.36 mL 12   No current facility-administered medications for this  visit.   Facility-Administered Medications Ordered in Other Visits  Medication Dose Route Frequency Provider Last Rate Last Admin   0.9 %  sodium chloride infusion   Intravenous Continuous Marsi Turvey, Daryl Eastern, NP 999 mL/hr at 07/15/23 1413 New Bag at 07/15/23 1413   heparin lock flush 100 unit/mL  500 Units Intravenous Once Emonte Dieujuste, Ivin Booty R, NP       sodium chloride flush (NS) 0.9 % injection 10 mL  10 mL Intravenous PRN Creig Hines, MD   10 mL at 03/09/21 0906    VITAL SIGNS: BP (!) 123/93   Pulse 98   Temp (!) 94.1 F (34.5 C) (Tympanic)   Resp (!) 22   LMP 09/15/2018  There were no vitals filed for this visit.   Estimated body mass index is 28.96 kg/m as calculated from the following:   Height as of 07/04/23: 5\' 6"  (1.676 m).   Weight as of 07/04/23: 179 lb 6.4 oz (81.4 kg).  LABS: CBC:    Component Value Date/Time   WBC 14.6 (H) 07/15/2023 1350   WBC 9.5 07/06/2023 0328   HGB 12.7 07/15/2023 1350   HGB 13.0 05/31/2022 1156   HCT 39.3 07/15/2023 1350   HCT 40.9 05/31/2022 1156   PLT 333 07/15/2023 1350   PLT 331 05/31/2022 1156   MCV 85.4 07/15/2023 1350   MCV 87 05/31/2022 1156   NEUTROABS 13.3 (H) 07/15/2023 1350   NEUTROABS 6.5 03/14/2018 1343   LYMPHSABS 0.7 07/15/2023 1350   LYMPHSABS 4.1 (H) 03/14/2018 1343   MONOABS 0.4 07/15/2023 1350   EOSABS 0.0 07/15/2023 1350   EOSABS 0.4 03/14/2018 1343   BASOSABS 0.0 07/15/2023 1350   BASOSABS 0.0 03/14/2018 1343   Comprehensive Metabolic Panel:    Component Value Date/Time   NA 128 (L) 07/15/2023 1350   NA 141 05/31/2022 1156   K 5.2 (H) 07/15/2023 1350   CL 92 (L) 07/15/2023 1350   CO2 23 07/15/2023 1350   BUN 27 (H) 07/15/2023 1350   BUN 8 05/31/2022 1156   CREATININE 0.56 07/15/2023 1350   GLUCOSE 143 (H) 07/15/2023 1350   CALCIUM 9.1 07/15/2023 1350   AST 24 07/15/2023 1350   ALT 33 07/15/2023 1350   ALKPHOS 202 (H) 07/15/2023 1350   BILITOT 0.9 07/15/2023 1350   PROT 7.5 07/15/2023 1350   PROT  5.9 (L) 05/31/2022 1156   ALBUMIN 3.7 07/15/2023 1350   ALBUMIN 3.4 (L) 05/31/2022 1156    RADIOGRAPHIC STUDIES: EEG adult  Result  Date: 07/06/2023 Jefferson Fuel, MD     07/08/2023  4:13 AM Routine EEG Report JASSLYN FINKEL is a 57 y.o. female with a history of seizures who is undergoing an EEG to evaluate for seizures. Report: This EEG was acquired with electrodes placed according to the International 10-20 electrode system (including Fp1, Fp2, F3, F4, C3, C4, P3, P4, O1, O2, T3, T4, T5, T6, A1, A2, Fz, Cz, Pz). The following electrodes were missing or displaced: none. The occipital dominant rhythm was 7 Hz with intermittent more pronounced diffuse slowing. This activity is reactive to stimulation. Drowsiness was manifested by background fragmentation; deeper stages of sleep were not identified. There was no focal slowing. There were no interictal epileptiform discharges. There were no electrographic seizures identified. There was no abnormal response to photic stimulation or hyperventilation. Impression and clinical correlation: This EEG was obtained while awake and drowsy and is abnormal due to mild diffuse slowing indicative of global cerebral dysfunction. Epileptiform abnormalities were not seen during this recording. Bing Neighbors, MD [facility] [phone] If 7pm- 7am, please page neurology on call as listed in AMION.   MR BRAIN W WO CONTRAST  Result Date: 07/05/2023 CLINICAL DATA:  57 year old female altered mental status. 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 Subsequent encounter. EXAM: MRI HEAD WITHOUT AND WITH CONTRAST TECHNIQUE: Multiplanar, multiecho pulse sequences of the brain and surrounding structures were obtained without and with intravenous contrast. CONTRAST:  7.78mL GADAVIST GADOBUTROL 1 MMOL/ML IV SOLN COMPARISON:  Head CT this  morning.  Brain MRI 01/20/2021. FINDINGS: Brain: Motion artifact on postcontrast imaging despite repeated imaging attempts. There are numerable subcentimeter FLAIR and T2 hyperintense foci scattered throughout the bilateral brain (see series 9), some of the largest of which correspond to the hyperdense foci by CT this morning (including adjacent to the right occipital horn series 9, image 24 measuring 9 mm). None of these are identified on SWI with susceptibility. However, some are mildly T1 hyperintense (such as in the left anterior frontal lobe series 7, image 16). But nearly all of the lesions are conspicuous on diffusion imaging, some are restricted and some have ring like morphology (series 13, image 28). None of the lesions are very apparent on motion degraded axial postcontrast imaging. But a few of them do appear to be enhancing on coronal postcontrast imaging in the posterior left hemisphere (series 21). Furthermore, when comparing pre and postcontrast sagittal T1 weighted imaging, it is suspected that many of the small lesions do abnormally enhance (see series 22). No associated vasogenic edema. And no significant intracranial mass effect. No dural thickening or enhancement. No superimposed restricted diffusion which is strongly suggestive of acute infarction. No ventriculomegaly, extra-axial collection. Cervicomedullary junction and pituitary are within normal limits. Vascular: Major intracranial vascular flow voids are stable. Following contrast the major dural venous sinuses are enhancing and appear to be patent. Skull and upper cervical spine: Thick left superior frontal bone metastasis was better demonstrated by CT this morning, decreased T2 signal there is new since the 2022 MRI. No destructive osseous lesion is identified. Sinuses/Orbits: Stable, negative. Other: Mastoids remain well aerated. Negative visible scalp and face. IMPRESSION: 1. Innumerable subcentimeter brain lesions which are new since  2022. Although most are difficult to identify on motion degraded post-contrast images, the constellation of MRI findings is more suggestive of brain metastases than embolic infarcts or other differential considerations. And it  is possible that most of the lesions are treated metastases, with no associated vasogenic edema or mass effect. A short interval repeat MRI in 2 months may be valuable. 2. Progressed skull metastases since 2022 but no destructive osseous lesion is identified. Electronically Signed   By: Odessa Fleming M.D.   On: 07/05/2023 13:24   CT HEAD WO CONTRAST ( )  Result Date: 07/05/2023 CLINICAL DATA:  57 year old female altered mental status. Possible trace hemorrhage layering in the right occipital horn on head CT 0055 hours today., and small left anterior frontal lobe hyperdensity also. Metastatic lung cancer. EXAM: CT HEAD WITHOUT CONTRAST TECHNIQUE: Contiguous axial images were obtained from the base of the skull through the vertex without intravenous contrast. RADIATION DOSE REDUCTION: This exam was performed according to the departmental dose-optimization program which includes automated exposure control, adjustment of the mA and/or kV according to patient size and/or use of iterative reconstruction technique. COMPARISON:  Head CT 0055 hours today.  Brain MRI 01/20/2021. FINDINGS: Brain: Unchanged small 5 mm hyperdense foci in both the left anterior frontal and right occipital lobes both visible on series 2, image 13 now. No associated edema or mass effect by CT. The right occipital lesion now is seen to be separate from the occipital horn which is on series 2, image 15. These appear to be parenchymal. Furthermore, there is a subtle 3rd hyperdense lesion in the right anterior inferior temporal lobe sagittal image 21. And possibly additional punctate lesions such as the left frontal lobe on series 2, image 17. All of these are new since 09/16/2022. No midline shift or intracranial mass effect.  No ventriculomegaly. No extra-axial blood identified. No cortically based acute infarct identified. Normal basilar cisterns. Vascular: No suspicious intracranial vascular hyperdensity. Skull: Abnormal left frontal bone appears infiltrated with abnormal sclerosis and periosteal reaction on series 3, image 51, new since 09/16/2022. This is compatible with skull metastasis. No superimposed skull fracture is identified. No lytic bone lesion is identified. Sinuses/Orbits: Visualized paranasal sinuses and mastoids are stable and well aerated. Other: Visualized orbits and scalp soft tissues are within normal limits. IMPRESSION: 1. 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 2. At least 3 subcentimeter hyperdense foci in the bilateral brain are more likely small hyperdense Brain Metastases than areas of intracranial blood. Brain MRI without and with contrast should be confirmatory and could be compared to 01/20/2021. 3. No associated intracranial mass effect.  No cerebral edema by CT. Electronically Signed   By: Odessa Fleming M.D.   On: 07/05/2023 07:54   CT CHEST ABDOMEN PELVIS W CONTRAST  Result Date: 07/05/2023 CLINICAL DATA:  Sepsis.  History of metastatic lung cancer. EXAM: CT CHEST, ABDOMEN, AND PELVIS WITH CONTRAST TECHNIQUE: Multidetector CT imaging of the chest, abdomen and pelvis was performed following the standard protocol during bolus administration of intravenous contrast. RADIATION DOSE REDUCTION: This exam was performed according to the departmental dose-optimization program which includes automated exposure control, adjustment of the mA and/or kV according to patient size and/or use of iterative reconstruction technique. CONTRAST:  OMNIPAQUE IOHEXOL 300 MG/ML  SOLN COMPARISON:  CT chest abdomen pelvis 04/19/2023 FINDINGS: CT CHEST FINDINGS Cardiovascular: Right chest wall accessed Port-A-Cath with tip at the superior cavoatrial junction. Normal heart size. No significant  pericardial effusion. The thoracic aorta is normal in caliber. No atherosclerotic plaque of the thoracic aorta. No coronary artery calcifications. Mediastinum/Nodes: Stable 1 cm subcarinal lymph node. No enlarged mediastinal, hilar, or axillary lymph nodes. Trachea and esophagus  demonstrate no significant findings. Tiny hiatal hernia. Subcentimeter hypodensity of the right thyroid gland-no further follow-up indicated. Lungs/Pleura: Right upper lobe and lower lobe patchy airspace opacities. Stable left lower lobe 8 x 7 mm subpleural nodule (4:50). No pulmonary mass. No pleural effusion. No pneumothorax. Musculoskeletal: No chest wall abnormality. Chronic diffuse axial and appendicular sclerotic osseous lesions. No acute displaced fracture. Multilevel degenerative changes of the spine. CT ABDOMEN PELVIS FINDINGS Hepatobiliary: Vague hypodensity along the falciform ligament likely focal fatty infiltration. No gallstones, gallbladder wall thickening, or pericholecystic fluid. No biliary dilatation. Pancreas: No focal lesion. Normal pancreatic contour. No surrounding inflammatory changes. No main pancreatic ductal dilatation. Spleen: Normal in size. Slightly more conspicuous splenic hypodensities measuring of 2 cm (2:53). Adrenals/Urinary Tract: Stable left adrenal gland nodules measuring of to 2.4 x 1.2 cm. Stable right adrenal gland nodules measuring up to 4.6 x 1.6 cm. Bilateral kidneys enhance symmetrically. No hydronephrosis. No hydroureter. The urinary bladder is unremarkable. Stomach/Bowel: Stomach is within normal limits. No evidence of bowel wall thickening or dilatation. Appendix appears normal. Vascular/Lymphatic: No abdominal aorta or iliac aneurysm. Mild atherosclerotic plaque of the aorta and its branches. No abdominal, pelvic, or inguinal lymphadenopathy. Reproductive: Uterus and bilateral adnexa are unremarkable. Other: No intraperitoneal free fluid. No intraperitoneal free gas. No organized fluid  collection. Musculoskeletal: No abdominal wall hernia or abnormality. Chronic diffuse axial and appendicular sclerotic osseous lesions. No acute displaced fracture. Six lumbar vertebral bodies with sacralization of the L6 level. Grade 1 anterolisthesis L4 on L5. Mild retrolisthesis of L1 on L2. Bilateral intramedullary nail fixation of the femurs. Persistent periprosthetic lucency along the right femoral shaft surgical hardware. IMPRESSION: 1. Multifocal right lung pneumonia versus aspiration pneumonia. Underlying malignancy not fully excluded. 2. Stable left lower lobe 8 x 7 mm subpleural nodule. 3. Stable indeterminate bilateral adrenal gland nodules. 4. Stable indeterminate splenic hypodensities. 5. Chronic diffuse axial and appendicular sclerotic osseous metastases. Electronically Signed   By: Tish Frederickson M.D.   On: 07/05/2023 02:23   CT HEAD WO CONTRAST ( )  Result Date: 07/05/2023 CLINICAL DATA:  Mental status change, unknown cause Pt discharged yesterday after rod placement in L leg - pt altered since d/c yesterday. EXAM: CT HEAD WITHOUT CONTRAST TECHNIQUE: Contiguous axial images were obtained from the base of the skull through the vertex without intravenous contrast. RADIATION DOSE REDUCTION: This exam was performed according to the departmental dose-optimization program which includes automated exposure control, adjustment of the mA and/or kV according to patient size and/or use of iterative reconstruction technique. COMPARISON:  CT head 09/16/2022 FINDINGS: Brain: No evidence of large-territorial acute infarction. No definite parenchymal hemorrhage. No mass lesion. No definite extra-axial collection. A 3 mm hyperdensity in the region of the posterior horn of the right lateral ventricle. Question vague 4 mm hyperdensity versus artifact within the left frontal lobe (3:14). No mass effect or midline shift. No hydrocephalus. Basilar cisterns are patent. Vascular: No hyperdense vessel. Skull: No  acute fracture or focal lesion. Sinuses/Orbits: Paranasal sinuses and mastoid air cells are clear. The orbits are unremarkable. Other: None. IMPRESSION: 1. A 3 mm hyperdensity in the region of the posterior horn of the right lateral ventricle. Indeterminate etiology with hemorrhage not excluded. Recommend follow-up CT in 4-6 hours to evaluate for stability. 2. Question vague 4 mm hyperdensity versus artifact within the left frontal lobe. Recommended attention on follow-up Electronically Signed   By: Tish Frederickson M.D.   On: 07/05/2023 02:01   DG Chest 1 View  Result Date: 07/02/2023  CLINICAL DATA:  Tachycardia EXAM: CHEST  1 VIEW COMPARISON:  None Available. FINDINGS: Abnormal mediastinal silhouette, compatible with known adenopathy. New somewhat nodular opacity in the right midlung. No visible pleural effusions or pneumothorax. Polyarticular degenerative change. IMPRESSION: 1. New somewhat nodular opacity in the right midlung which could represent progressive metastatic disease, treatment change, and/or infection. CT of the chest with contrast could further evaluate if clinically warranted 2. Abnormal mediastinal silhouette, compatible with known lymphadenopathy. Electronically Signed   By: Feliberto Harts M.D.   On: 07/02/2023 13:02   DG HIP UNILAT WITH PELVIS 2-3 VIEWS LEFT  Result Date: 06/30/2023 CLINICAL DATA:  Left hip surgery.  Intraoperative fluoroscopy. EXAM: DG HIP (WITH OR WITHOUT PELVIS) 2-3V LEFT COMPARISON:  Left femur radiographs 04/20/2023 FINDINGS: Images were performed intraoperatively without the presence of a radiologist. Interval left femoral long intramedullary nail fixation, spanning the multiple previously seen sclerotic left femoral bone diaphyses. No hardware complication is seen. Total fluoroscopy images: 4 Total fluoroscopy time: 120 seconds Total dose: Radiation Exposure Index (as provided by the fluoroscopic device): 20.73 mGy air Kerma Please see intraoperative findings  for further detail. IMPRESSION: Intraoperative fluoroscopy for left femoral long intramedullary nail fixation. Electronically Signed   By: Neita Garnet M.D.   On: 06/30/2023 16:44   DG C-Arm 1-60 Min-No Report  Result Date: 06/30/2023 Fluoroscopy was utilized by the requesting physician.  No radiographic interpretation.   DG C-Arm 1-60 Min-No Report  Result Date: 06/30/2023 Fluoroscopy was utilized by the requesting physician.  No radiographic interpretation.    PERFORMANCE STATUS (ECOG) : 1 - Symptomatic but completely ambulatory  Review of Systems Unless otherwise noted, a complete review of systems is negative.  Physical Exam General: NAD Cardiac: tachy, regular rhythm Pulmonary: clear anterior/posterior fields Abdominal: Soft, nontender to palp Extremities: no edema, no joint deformities Skin: no rashes Neurological: Weakness but otherwise nonfocal  IMPRESSION/PLAN: Stage IV non-small cell lung cancer -maintenance Avastin on hold due to recent left hip surgery and whole brain radiation  Fatigue/Weakness -patient reportedly hypotensive at Ortho.  However, was normotensive upon presentation to our clinic.  Initially, she was cold and clammy but denied other symptomatic complaints.  Patient received supportive care and IV fluids and patient reportedly felt much improved.  Discussed ED triggers in detail with patient's husband.  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  Patient was in pain during clinic visit.  She received hydromorphone 0.5 mg IV x 1.  Hyperkalemia -order line.  Discussed with Dr. Smith Robert and will trend labs when patient follows up next week.  Follow-up next week for labs/fluid clinic.  Case and plan discussed with Dr. Smith Robert.   Patient expressed understanding and was in agreement with this plan. She also understands that She can call clinic at any time with any questions, concerns, or complaints.   Thank you for allowing me to  participate in the care of this very pleasant patient.   Time Total: 25 minutes  Visit consisted of counseling and education dealing with the complex and emotionally intense issues of symptom management in the setting of serious illness.Greater than 50%  of this time was spent counseling and coordinating care related to the above assessment and plan.  Signed by: Laurette Schimke, PhD, NP-C

## 2023-07-15 NOTE — Telephone Encounter (Signed)
I called patient's husband to f/u on pt's blood pressure readings. Per husband, bp has been running at 140/82 and HR 96. Pt's husband advised that patient needs to still f/u with pcp. He stated that he has already been in discussion about the hypertension with Dr. Assunta Gambles team and would prefer her input as well before changing any medications. He said he had already spoke to [place], [place] about this.

## 2023-07-17 ENCOUNTER — Other Ambulatory Visit: Payer: Self-pay

## 2023-07-17 ENCOUNTER — Other Ambulatory Visit: Payer: Self-pay | Admitting: *Deleted

## 2023-07-17 DIAGNOSIS — I959 Hypotension, unspecified: Secondary | ICD-10-CM

## 2023-07-17 DIAGNOSIS — C3491 Malignant neoplasm of unspecified part of right bronchus or lung: Secondary | ICD-10-CM

## 2023-07-17 DIAGNOSIS — G893 Neoplasm related pain (acute) (chronic): Secondary | ICD-10-CM

## 2023-07-17 LAB — URINE CULTURE: Culture: 30000 — AB

## 2023-07-18 ENCOUNTER — Ambulatory Visit: Payer: 59

## 2023-07-18 ENCOUNTER — Inpatient Hospital Stay (HOSPITAL_BASED_OUTPATIENT_CLINIC_OR_DEPARTMENT_OTHER): Payer: 59 | Admitting: Hospice and Palliative Medicine

## 2023-07-18 ENCOUNTER — Inpatient Hospital Stay: Payer: 59

## 2023-07-18 ENCOUNTER — Other Ambulatory Visit: Payer: Self-pay | Admitting: *Deleted

## 2023-07-18 ENCOUNTER — Telehealth: Payer: 59 | Admitting: Hospice and Palliative Medicine

## 2023-07-18 ENCOUNTER — Encounter: Payer: Self-pay | Admitting: Oncology

## 2023-07-18 VITALS — BP 106/80 | HR 82 | Temp 94.1°F | Resp 16

## 2023-07-18 DIAGNOSIS — G893 Neoplasm related pain (acute) (chronic): Secondary | ICD-10-CM

## 2023-07-18 DIAGNOSIS — E860 Dehydration: Secondary | ICD-10-CM

## 2023-07-18 DIAGNOSIS — C3491 Malignant neoplasm of unspecified part of right bronchus or lung: Secondary | ICD-10-CM | POA: Diagnosis not present

## 2023-07-18 DIAGNOSIS — I959 Hypotension, unspecified: Secondary | ICD-10-CM

## 2023-07-18 DIAGNOSIS — C7951 Secondary malignant neoplasm of bone: Secondary | ICD-10-CM | POA: Diagnosis not present

## 2023-07-18 DIAGNOSIS — K5900 Constipation, unspecified: Secondary | ICD-10-CM | POA: Diagnosis not present

## 2023-07-18 LAB — CBC WITH DIFFERENTIAL/PLATELET
Abs Immature Granulocytes: 0.23 10*3/uL — ABNORMAL HIGH (ref 0.00–0.07)
Basophils Absolute: 0.1 10*3/uL (ref 0.0–0.1)
Basophils Relative: 0 %
Eosinophils Absolute: 0 10*3/uL (ref 0.0–0.5)
Eosinophils Relative: 0 %
HCT: 40.9 % (ref 36.0–46.0)
Hemoglobin: 13.2 g/dL (ref 12.0–15.0)
Immature Granulocytes: 2 %
Lymphocytes Relative: 5 %
Lymphs Abs: 0.7 10*3/uL (ref 0.7–4.0)
MCH: 27.7 pg (ref 26.0–34.0)
MCHC: 32.3 g/dL (ref 30.0–36.0)
MCV: 85.7 fL (ref 80.0–100.0)
Monocytes Absolute: 0.4 10*3/uL (ref 0.1–1.0)
Monocytes Relative: 2 %
Neutro Abs: 14.2 10*3/uL — ABNORMAL HIGH (ref 1.7–7.7)
Neutrophils Relative %: 91 %
Platelets: 340 10*3/uL (ref 150–400)
RBC: 4.77 MIL/uL (ref 3.87–5.11)
RDW: 16.7 % — ABNORMAL HIGH (ref 11.5–15.5)
WBC: 15.6 10*3/uL — ABNORMAL HIGH (ref 4.0–10.5)
nRBC: 0 % (ref 0.0–0.2)

## 2023-07-18 LAB — COMPREHENSIVE METABOLIC PANEL
ALT: 35 U/L (ref 0–44)
AST: 27 U/L (ref 15–41)
Albumin: 3.7 g/dL (ref 3.5–5.0)
Alkaline Phosphatase: 183 U/L — ABNORMAL HIGH (ref 38–126)
Anion gap: 13 (ref 5–15)
BUN: 28 mg/dL — ABNORMAL HIGH (ref 6–20)
CO2: 22 mmol/L (ref 22–32)
Calcium: 8.7 mg/dL — ABNORMAL LOW (ref 8.9–10.3)
Chloride: 91 mmol/L — ABNORMAL LOW (ref 98–111)
Creatinine, Ser: 0.65 mg/dL (ref 0.44–1.00)
GFR, Estimated: >60 mL/min (ref >60)
Glucose, Bld: 135 mg/dL — ABNORMAL HIGH (ref 70–99)
Potassium: 5 mmol/L (ref 3.5–5.1)
Sodium: 126 mmol/L — ABNORMAL LOW (ref 135–145)
Total Bilirubin: 1.3 mg/dL — ABNORMAL HIGH (ref <1.2)
Total Protein: 7.4 g/dL (ref 6.5–8.1)

## 2023-07-18 MED ORDER — HEPARIN SOD (PORK) LOCK FLUSH 100 UNIT/ML IV SOLN
500.0000 [IU] | Freq: Once | INTRAVENOUS | Status: AC
  Administered 2023-07-18: 500 [IU] via INTRAVENOUS
  Filled 2023-07-18: qty 5

## 2023-07-18 MED ORDER — SODIUM CHLORIDE 0.9% FLUSH
10.0000 mL | Freq: Once | INTRAVENOUS | Status: AC
  Administered 2023-07-18: 10 mL via INTRAVENOUS
  Filled 2023-07-18: qty 10

## 2023-07-18 MED ORDER — HYDROMORPHONE HCL 1 MG/ML IJ SOLN
0.5000 mg | Freq: Once | INTRAMUSCULAR | Status: AC
  Administered 2023-07-18: 0.5 mg via INTRAVENOUS
  Filled 2023-07-18: qty 1

## 2023-07-18 MED ORDER — HYDROMORPHONE HCL 2 MG PO TABS: 2.0000 mg | ORAL_TABLET | ORAL | 0 refills | Status: DC | PRN

## 2023-07-18 MED ORDER — SODIUM CHLORIDE 0.9 % IV SOLN
INTRAVENOUS | Status: DC
  Filled 2023-07-18 (×2): qty 250

## 2023-07-18 NOTE — Progress Notes (Signed)
Symptom Management [facility] [phone]) [phone] [phone]) [phone]  Patient Care Team: Armando Gang, FNP as PCP - General (Family Medicine) Glory Buff, RN as Oncology Nurse Navigator Carmina Miller, MD as Radiation Oncologist (Radiation Oncology) Vida Rigger, MD as Consulting Physician (Pulmonary Disease) Creig Hines, MD as Consulting Physician (Oncology)   NAME OF PATIENT: Gina Sanchez  [id number]  1965-10-27   DATE OF VISIT: 07/18/23  REASON FOR CONSULT: Gina Sanchez is a 57 y.o. 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    Patient hospitalized 07/04/2023 to 07/09/2023 with sepsis from multifocal pneumonia.  She was also started on Keppra for possible seizures.  CT of the head showed significant bilateral metastatic disease, new when compared to previous images.  Patient was started on whole brain radiation.  INTERVAL HISTORY: Patient seen several times in [facility] [place] last week for weakness and fatigue and received IV fluids.  Patient returns to clinic today for recheck labs/supportive care.  Today, patient says that she feels okay.  Denies acute changes or new symptomatic complaints.  Endorses constipation is unclear when she had a last bowel movement.  Denies nausea, vomiting, or abdominal pain.  Says she ate a good dinner last night.  Denies fever or chills.  No cough, congestion, shortness of breath, or chest pain.  Denies nausea or vomiting.  Does have constipation.  Denies any urinary symptoms.  Denies other symptomatic complaints.  PAST MEDICAL HISTORY: Past Medical History:  Diagnosis Date   Abnormal Pap smear of cervix    Anxiety    C. 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   Chronic diarrhea    Closed fracture of distal end of left radius 03/23/2021   Colon polyp    Depression     Diverticulosis    Essential hypertension    Femur fracture, right (HCC) 09/14/2020   GERD (gastroesophageal reflux disease)    Hyperlipidemia    Leukocytosis    Lumbar radiculopathy    Lung cancer (HCC)    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   Metastatic cancer (HCC) 10/2020   lung, bone, lymph node, adrenal   Palpitations    Precordial chest pain    Wrist fracture 03/2021   left    PAST SURGICAL HISTORY:  Past Surgical History:  Procedure Laterality Date   BREAST BIOPSY Right 2017   benign   CERVICAL BIOPSY  W/ LOOP ELECTRODE EXCISION     COLONOSCOPY  06/11/2022   Procedure: COLONOSCOPY;  Surgeon: Toney Reil, MD;  Location: [facility] ENDOSCOPY;  Service: Gastroenterology;;   COLONOSCOPY WITH PROPOFOL N/A 07/03/2015   Procedure: COLONOSCOPY WITH PROPOFOL;  Surgeon: Wallace Cullens, MD;  Location: [facility] ENDOSCOPY;  Service: Gastroenterology;  Laterality: N/A;   COLONOSCOPY WITH PROPOFOL N/A 01/06/2022   Procedure: COLONOSCOPY WITH PROPOFOL;  Surgeon: Toney Reil, MD;  Location: [facility] ENDOSCOPY;  Service: Gastroenterology;  Laterality: N/A;   ESOPHAGOGASTRODUODENOSCOPY (EGD) WITH PROPOFOL N/A 07/03/2015   Procedure: ESOPHAGOGASTRODUODENOSCOPY (EGD) WITH PROPOFOL;  Surgeon: Wallace Cullens, MD;  Location: [facility] ENDOSCOPY;  Service: Gastroenterology;  Laterality: N/A;   FEMUR IM NAIL Left 06/30/2023   Procedure: Left prophylactic intramedullary nailing of impending left subtrochanteric femur fracture;  Surgeon: Christena Flake, MD;  Location: [facility] ORS;  Service: Orthopedics;  Laterality: Left;   FLEXIBLE SIGMOIDOSCOPY N/A 03/12/2022   Procedure: FLEXIBLE SIGMOIDOSCOPY;  Surgeon: Lannette Donath  Betti Cruz, MD;  Location: [facility] ENDOSCOPY;  Service: Gastroenterology;  Laterality: N/A;   FLEXIBLE SIGMOIDOSCOPY N/A 08/17/2022   Procedure: FLEXIBLE SIGMOIDOSCOPY;  Surgeon: Toney Reil, MD;  Location: [facility] ENDOSCOPY;  Service: Gastroenterology;  Laterality: N/A;   INTRAMEDULLARY (IM) NAIL  INTERTROCHANTERIC Right 09/15/2020   Procedure: INTRAMEDULLARY (IM) NAIL INTERTROCHANTRIC;  Surgeon: Christena Flake, MD;  Location: [facility] ORS;  Service: Orthopedics;  Laterality: Right;   IR CV LINE INJECTION  07/14/2022   IR IMAGING GUIDED PORT INSERTION  11/28/2020   LEFT HEART CATH AND CORONARY ANGIOGRAPHY N/A 03/28/2017   Procedure: Left Heart Cath and Coronary Angiography;  Surgeon: Runell Gess, MD;  Location: [facility] INVASIVE CV LAB;  Service: Cardiovascular;  Laterality: N/A;   ORIF WRIST FRACTURE Left 03/31/2021   Procedure: OPEN REDUCTION INTERNAL FIXATION (ORIF) LEFT DISTAL RADIUS FRACTURE.;  Surgeon: Christena Flake, MD;  Location: [facility] ORS;  Service: Orthopedics;  Laterality: Left;    HEMATOLOGY/ONCOLOGY HISTORY:  Oncology History  Metastatic lung cancer (metastasis from lung to other site) [facility])  10/19/2020 Initial Diagnosis   Metastatic lung cancer (metastasis from lung to other site) [facility])   10/19/2020 Cancer Staging   Staging form: Lung, AJCC 8th Edition - Clinical: Stage IV (cT1, cN3, pM1) - Signed by Creig Hines, MD on 10/19/2020   10/27/2020 - 02/12/2022 Chemotherapy   Patient is on Treatment Plan : LUNG NSCLC Pemetrexed (Alimta) / Carboplatin q21d x 1 cycles     06/08/2022 - 09/15/2022 Chemotherapy   Patient is on Treatment Plan : LUNG NSCLC Pemetrexed + Carboplatin q21d x 4 Cycles     02/17/2023 -  Chemotherapy   Patient is on Treatment Plan : LUNG NSCLC Carboplatin + Paclitaxel + Bevacizumab q21d       ALLERGIES:  is allergic to factive [gemifloxacin].  MEDICATIONS:  Current Outpatient Medications  Medication Sig Dispense Refill   acetaminophen (TYLENOL) 500 MG tablet Take 500 mg by mouth every 6 (six) hours as needed.     ALPRAZolam (XANAX) 0.5 MG tablet TAKE 1 TABLET BY MOUTH DAILY AS NEEDED FOR ANXIETY. 30 tablet 0   citalopram (CELEXA) 10 MG tablet Take 1 tablet (10 mg total) by mouth daily. 30 tablet 3   dexamethasone (DECADRON) 4 MG tablet Take 1 tablet (4 mg  total) by mouth 2 (two) times daily. 60 tablet 0   fentaNYL (DURAGESIC) 25 MCG/HR Place 1 patch onto the skin every 3 (three) days. 10 patch 0   HYDROcodone bit-homatropine (HYCODAN) 5-1.5 MG/5ML syrup Take 5 mLs by mouth every 6 (six) hours as needed for cough. 240 mL 0   hydrocortisone (CORTEF) 10 MG tablet Take 1 tablet (10 mg total) by mouth at bedtime. 30 tablet 3   hydrocortisone (CORTEF) 20 MG tablet Take 1 tablet (20 mg total) by mouth every morning. 30 tablet 3   HYDROmorphone (DILAUDID) 2 MG tablet Take 1-2 tablets (2-4 mg total) by mouth every 4 (four) hours as needed for severe pain. 120 tablet 0   HYDROmorphone (DILAUDID) 2 MG tablet Take 1-2 tablets (2-4 mg total) by mouth every 4 (four) hours as needed for severe pain (pain score 7-10) or moderate pain (pain score 4-6). 20 tablet 0   levETIRAcetam (KEPPRA) 500 MG tablet Take 1 tablet (500 mg total) by mouth 2 (two) times daily. 60 tablet 2   lidocaine (LIDODERM) 5 % Place 1 patch onto the skin daily. Remove & Discard patch within 12 hours or as directed by MD 30 patch 1  metoprolol succinate (TOPROL-XL) 25 MG 24 hr tablet Take 1 tablet (25 mg total) by mouth daily. 90 tablet 0   ondansetron (ZOFRAN) 4 MG tablet TAKE 1 TABLET BY MOUTH EVERY 8 HOURS AS NEEDED FOR NAUSEA AND VOMITING (Patient taking differently: Take 4 mg by mouth every 8 (eight) hours as needed for nausea or vomiting. TAKE 1 TABLET BY MOUTH EVERY 8 HOURS AS NEEDED FOR NAUSEA AND VOMITING) 20 tablet 1   prochlorperazine (COMPAZINE) 10 MG tablet Take 1 tablet (10 mg total) by mouth every 6 (six) hours as needed for nausea or vomiting. 30 tablet 1   Vedolizumab (ENTYVIO) 108 MG/0.68ML SOPN Inject 1 Pen into the skin every 14 (fourteen) days. (Patient taking differently: Inject 1 Pen into the skin every 30 (thirty) days.) 1.36 mL 12   No current facility-administered medications for this visit.   Facility-Administered Medications Ordered in Other Visits  Medication Dose  Route Frequency Provider Last Rate Last Admin   sodium chloride flush (NS) 0.9 % injection 10 mL  10 mL Intravenous PRN Creig Hines, MD   10 mL at 03/09/21 0906    VITAL SIGNS: LMP 09/15/2018  There were no vitals filed for this visit.   Estimated body mass index is 28.96 kg/m as calculated from the following:   Height as of 07/04/23: 5\' 6"  (1.676 m).   Weight as of 07/04/23: 179 lb 6.4 oz (81.4 kg).  LABS: CBC:    Component Value Date/Time   WBC 14.6 (H) 07/15/2023 1350   WBC 9.5 03-Aug-2023 0328   HGB 12.7 07/15/2023 1350   HGB 13.0 05/31/2022 1156   HCT 39.3 07/15/2023 1350   HCT 40.9 05/31/2022 1156   PLT 333 07/15/2023 1350   PLT 331 05/31/2022 1156   MCV 85.4 07/15/2023 1350   MCV 87 05/31/2022 1156   NEUTROABS 13.3 (H) 07/15/2023 1350   NEUTROABS 6.5 03/14/2018 1343   LYMPHSABS 0.7 07/15/2023 1350   LYMPHSABS 4.1 (H) 03/14/2018 1343   MONOABS 0.4 07/15/2023 1350   EOSABS 0.0 07/15/2023 1350   EOSABS 0.4 03/14/2018 1343   BASOSABS 0.0 07/15/2023 1350   BASOSABS 0.0 03/14/2018 1343   Comprehensive Metabolic Panel:    Component Value Date/Time   NA 128 (L) 07/15/2023 1350   NA 141 05/31/2022 1156   K 5.2 (H) 07/15/2023 1350   CL 92 (L) 07/15/2023 1350   CO2 23 07/15/2023 1350   BUN 27 (H) 07/15/2023 1350   BUN 8 05/31/2022 1156   CREATININE 0.56 07/15/2023 1350   GLUCOSE 143 (H) 07/15/2023 1350   CALCIUM 9.1 07/15/2023 1350   AST 24 07/15/2023 1350   ALT 33 07/15/2023 1350   ALKPHOS 202 (H) 07/15/2023 1350   BILITOT 0.9 07/15/2023 1350   PROT 7.5 07/15/2023 1350   PROT 5.9 (L) 05/31/2022 1156   ALBUMIN 3.7 07/15/2023 1350   ALBUMIN 3.4 (L) 05/31/2022 1156    RADIOGRAPHIC STUDIES: EEG adult  Result Date: 08/03/23 Jefferson Fuel, MD     07/08/2023  4:13 AM Routine EEG Report CASSANDR CEDERBERG is a 57 y.o. female with a history of seizures who is undergoing an EEG to evaluate for seizures. Report: This EEG was acquired with electrodes placed  according to the International 10-20 electrode system (including Fp1, Fp2, F3, F4, C3, C4, P3, P4, O1, O2, T3, T4, T5, T6, A1, A2, Fz, Cz, Pz). The following electrodes were missing or displaced: none. The occipital dominant rhythm was 7 Hz with intermittent more pronounced  diffuse slowing. This activity is reactive to stimulation. Drowsiness was manifested by background fragmentation; deeper stages of sleep were not identified. There was no focal slowing. There were no interictal epileptiform discharges. There were no electrographic seizures identified. There was no abnormal response to photic stimulation or hyperventilation. Impression and clinical correlation: This EEG was obtained while awake and drowsy and is abnormal due to mild diffuse slowing indicative of global cerebral dysfunction. Epileptiform abnormalities were not seen during this recording. Bing Neighbors, MD [facility] [phone] If 7pm- 7am, please page neurology on call as listed in AMION.   MR BRAIN W WO CONTRAST  Result Date: 07/05/2023 CLINICAL DATA:  57 year old female altered mental status. 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 Subsequent encounter. EXAM: MRI HEAD WITHOUT AND WITH CONTRAST TECHNIQUE: Multiplanar, multiecho pulse sequences of the brain and surrounding structures were obtained without and with intravenous contrast. CONTRAST:  7.97mL GADAVIST GADOBUTROL 1 MMOL/ML IV SOLN COMPARISON:  Head CT this morning.  Brain MRI 01/20/2021. FINDINGS: Brain: Motion artifact on postcontrast imaging despite repeated imaging attempts. There are numerable subcentimeter FLAIR and T2 hyperintense foci scattered throughout the bilateral brain (see series 9), some of the largest of which correspond to the hyperdense foci by CT this morning (including adjacent to the right occipital horn series 9,  image 24 measuring 9 mm). None of these are identified on SWI with susceptibility. However, some are mildly T1 hyperintense (such as in the left anterior frontal lobe series 7, image 16). But nearly all of the lesions are conspicuous on diffusion imaging, some are restricted and some have ring like morphology (series 13, image 28). None of the lesions are very apparent on motion degraded axial postcontrast imaging. But a few of them do appear to be enhancing on coronal postcontrast imaging in the posterior left hemisphere (series 21). Furthermore, when comparing pre and postcontrast sagittal T1 weighted imaging, it is suspected that many of the small lesions do abnormally enhance (see series 22). No associated vasogenic edema. And no significant intracranial mass effect. No dural thickening or enhancement. No superimposed restricted diffusion which is strongly suggestive of acute infarction. No ventriculomegaly, extra-axial collection. Cervicomedullary junction and pituitary are within normal limits. Vascular: Major intracranial vascular flow voids are stable. Following contrast the major dural venous sinuses are enhancing and appear to be patent. Skull and upper cervical spine: Thick left superior frontal bone metastasis was better demonstrated by CT this morning, decreased T2 signal there is new since the 2022 MRI. No destructive osseous lesion is identified. Sinuses/Orbits: Stable, negative. Other: Mastoids remain well aerated. Negative visible scalp and face. IMPRESSION: 1. Innumerable subcentimeter brain lesions which are new since 2022. Although most are difficult to identify on motion degraded post-contrast images, the constellation of MRI findings is more suggestive of brain metastases than embolic infarcts or other differential considerations. And it is possible that most of the lesions are treated metastases, with no associated vasogenic edema or mass effect. A short interval repeat MRI in 2 months may be  valuable. 2. Progressed skull metastases since 2022 but no destructive osseous lesion is identified. Electronically Signed   By: Odessa Fleming M.D.   On: 07/05/2023 13:24   CT HEAD WO CONTRAST ( )  Result Date: 07/05/2023 CLINICAL DATA:  57 year old female altered mental status. Possible trace hemorrhage layering in the right occipital horn on head CT 0055 hours today., and small left  anterior frontal lobe hyperdensity also. Metastatic lung cancer. EXAM: CT HEAD WITHOUT CONTRAST TECHNIQUE: Contiguous axial images were obtained from the base of the skull through the vertex without intravenous contrast. RADIATION DOSE REDUCTION: This exam was performed according to the departmental dose-optimization program which includes automated exposure control, adjustment of the mA and/or kV according to patient size and/or use of iterative reconstruction technique. COMPARISON:  Head CT 0055 hours today.  Brain MRI 01/20/2021. FINDINGS: Brain: Unchanged small 5 mm hyperdense foci in both the left anterior frontal and right occipital lobes both visible on series 2, image 13 now. No associated edema or mass effect by CT. The right occipital lesion now is seen to be separate from the occipital horn which is on series 2, image 15. These appear to be parenchymal. Furthermore, there is a subtle 3rd hyperdense lesion in the right anterior inferior temporal lobe sagittal image 21. And possibly additional punctate lesions such as the left frontal lobe on series 2, image 17. All of these are new since 09/16/2022. No midline shift or intracranial mass effect. No ventriculomegaly. No extra-axial blood identified. No cortically based acute infarct identified. Normal basilar cisterns. Vascular: No suspicious intracranial vascular hyperdensity. Skull: Abnormal left frontal bone appears infiltrated with abnormal sclerosis and periosteal reaction on series 3, image 51, new since 09/16/2022. This is compatible with skull metastasis. No  superimposed skull fracture is identified. No lytic bone lesion is identified. Sinuses/Orbits: Visualized paranasal sinuses and mastoids are stable and well aerated. Other: Visualized orbits and scalp soft tissues are within normal limits. IMPRESSION: 1. 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 2. At least 3 subcentimeter hyperdense foci in the bilateral brain are more likely small hyperdense Brain Metastases than areas of intracranial blood. Brain MRI without and with contrast should be confirmatory and could be compared to 01/20/2021. 3. No associated intracranial mass effect.  No cerebral edema by CT. Electronically Signed   By: Odessa Fleming M.D.   On: 07/05/2023 07:54   CT CHEST ABDOMEN PELVIS W CONTRAST  Result Date: 07/05/2023 CLINICAL DATA:  Sepsis.  History of metastatic lung cancer. EXAM: CT CHEST, ABDOMEN, AND PELVIS WITH CONTRAST TECHNIQUE: Multidetector CT imaging of the chest, abdomen and pelvis was performed following the standard protocol during bolus administration of intravenous contrast. RADIATION DOSE REDUCTION: This exam was performed according to the departmental dose-optimization program which includes automated exposure control, adjustment of the mA and/or kV according to patient size and/or use of iterative reconstruction technique. CONTRAST:  OMNIPAQUE IOHEXOL 300 MG/ML  SOLN COMPARISON:  CT chest abdomen pelvis 04/19/2023 FINDINGS: CT CHEST FINDINGS Cardiovascular: Right chest wall accessed Port-A-Cath with tip at the superior cavoatrial junction. Normal heart size. No significant pericardial effusion. The thoracic aorta is normal in caliber. No atherosclerotic plaque of the thoracic aorta. No coronary artery calcifications. Mediastinum/Nodes: Stable 1 cm subcarinal lymph node. No enlarged mediastinal, hilar, or axillary lymph nodes. Trachea and esophagus demonstrate no significant findings. Tiny hiatal hernia. Subcentimeter hypodensity of the right  thyroid gland-no further follow-up indicated. Lungs/Pleura: Right upper lobe and lower lobe patchy airspace opacities. Stable left lower lobe 8 x 7 mm subpleural nodule (4:50). No pulmonary mass. No pleural effusion. No pneumothorax. Musculoskeletal: No chest wall abnormality. Chronic diffuse axial and appendicular sclerotic osseous lesions. No acute displaced fracture. Multilevel degenerative changes of the spine. CT ABDOMEN PELVIS FINDINGS Hepatobiliary: Vague hypodensity along the falciform ligament likely focal fatty infiltration. No gallstones, gallbladder wall thickening, or pericholecystic fluid. No  biliary dilatation. Pancreas: No focal lesion. Normal pancreatic contour. No surrounding inflammatory changes. No main pancreatic ductal dilatation. Spleen: Normal in size. Slightly more conspicuous splenic hypodensities measuring of 2 cm (2:53). Adrenals/Urinary Tract: Stable left adrenal gland nodules measuring of to 2.4 x 1.2 cm. Stable right adrenal gland nodules measuring up to 4.6 x 1.6 cm. Bilateral kidneys enhance symmetrically. No hydronephrosis. No hydroureter. The urinary bladder is unremarkable. Stomach/Bowel: Stomach is within normal limits. No evidence of bowel wall thickening or dilatation. Appendix appears normal. Vascular/Lymphatic: No abdominal aorta or iliac aneurysm. Mild atherosclerotic plaque of the aorta and its branches. No abdominal, pelvic, or inguinal lymphadenopathy. Reproductive: Uterus and bilateral adnexa are unremarkable. Other: No intraperitoneal free fluid. No intraperitoneal free gas. No organized fluid collection. Musculoskeletal: No abdominal wall hernia or abnormality. Chronic diffuse axial and appendicular sclerotic osseous lesions. No acute displaced fracture. Six lumbar vertebral bodies with sacralization of the L6 level. Grade 1 anterolisthesis L4 on L5. Mild retrolisthesis of L1 on L2. Bilateral intramedullary nail fixation of the femurs. Persistent periprosthetic lucency  along the right femoral shaft surgical hardware. IMPRESSION: 1. Multifocal right lung pneumonia versus aspiration pneumonia. Underlying malignancy not fully excluded. 2. Stable left lower lobe 8 x 7 mm subpleural nodule. 3. Stable indeterminate bilateral adrenal gland nodules. 4. Stable indeterminate splenic hypodensities. 5. Chronic diffuse axial and appendicular sclerotic osseous metastases. Electronically Signed   By: Tish Frederickson M.D.   On: 07/05/2023 02:23   CT HEAD WO CONTRAST ( )  Result Date: 07/05/2023 CLINICAL DATA:  Mental status change, unknown cause Pt discharged yesterday after rod placement in L leg - pt altered since d/c yesterday. EXAM: CT HEAD WITHOUT CONTRAST TECHNIQUE: Contiguous axial images were obtained from the base of the skull through the vertex without intravenous contrast. RADIATION DOSE REDUCTION: This exam was performed according to the departmental dose-optimization program which includes automated exposure control, adjustment of the mA and/or kV according to patient size and/or use of iterative reconstruction technique. COMPARISON:  CT head 09/16/2022 FINDINGS: Brain: No evidence of large-territorial acute infarction. No definite parenchymal hemorrhage. No mass lesion. No definite extra-axial collection. A 3 mm hyperdensity in the region of the posterior horn of the right lateral ventricle. Question vague 4 mm hyperdensity versus artifact within the left frontal lobe (3:14). No mass effect or midline shift. No hydrocephalus. Basilar cisterns are patent. Vascular: No hyperdense vessel. Skull: No acute fracture or focal lesion. Sinuses/Orbits: Paranasal sinuses and mastoid air cells are clear. The orbits are unremarkable. Other: None. IMPRESSION: 1. A 3 mm hyperdensity in the region of the posterior horn of the right lateral ventricle. Indeterminate etiology with hemorrhage not excluded. Recommend follow-up CT in 4-6 hours to evaluate for stability. 2. Question vague 4 mm  hyperdensity versus artifact within the left frontal lobe. Recommended attention on follow-up Electronically Signed   By: Tish Frederickson M.D.   On: 07/05/2023 02:01   DG Chest 1 View  Result Date: 07/02/2023 CLINICAL DATA:  Tachycardia EXAM: CHEST  1 VIEW COMPARISON:  None Available. FINDINGS: Abnormal mediastinal silhouette, compatible with known adenopathy. New somewhat nodular opacity in the right midlung. No visible pleural effusions or pneumothorax. Polyarticular degenerative change. IMPRESSION: 1. New somewhat nodular opacity in the right midlung which could represent progressive metastatic disease, treatment change, and/or infection. CT of the chest with contrast could further evaluate if clinically warranted 2. Abnormal mediastinal silhouette, compatible with known lymphadenopathy. Electronically Signed   By: Feliberto Harts M.D.   On: 07/02/2023 13:02  DG HIP UNILAT WITH PELVIS 2-3 VIEWS LEFT  Result Date: 06/30/2023 CLINICAL DATA:  Left hip surgery.  Intraoperative fluoroscopy. EXAM: DG HIP (WITH OR WITHOUT PELVIS) 2-3V LEFT COMPARISON:  Left femur radiographs 04/20/2023 FINDINGS: Images were performed intraoperatively without the presence of a radiologist. Interval left femoral long intramedullary nail fixation, spanning the multiple previously seen sclerotic left femoral bone diaphyses. No hardware complication is seen. Total fluoroscopy images: 4 Total fluoroscopy time: 120 seconds Total dose: Radiation Exposure Index (as provided by the fluoroscopic device): 20.73 mGy air Kerma Please see intraoperative findings for further detail. IMPRESSION: Intraoperative fluoroscopy for left femoral long intramedullary nail fixation. Electronically Signed   By: Neita Garnet M.D.   On: 06/30/2023 16:44   DG C-Arm 1-60 Min-No Report  Result Date: 06/30/2023 Fluoroscopy was utilized by the requesting physician.  No radiographic interpretation.   DG C-Arm 1-60 Min-No Report  Result Date:  06/30/2023 Fluoroscopy was utilized by the requesting physician.  No radiographic interpretation.    PERFORMANCE STATUS (ECOG) : 1 - Symptomatic but completely ambulatory  Review of Systems Unless otherwise noted, a complete review of systems is negative.  Physical Exam General: NAD Cardiac: tachy, regular rhythm Pulmonary: clear anterior/posterior fields Abdominal: Soft, nontender to palp Extremities: no edema, no joint deformities Skin: no rashes Neurological: Weakness but otherwise nonfocal  IMPRESSION/PLAN: Stage IV non-small cell lung cancer -maintenance Avastin on hold due to recent left hip surgery and whole brain radiation  Fatigue/Weakness -patient feels better when she receives IV fluids.  Will proceed with IV fluids and supportive care today.  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  Patient in pain during clinic visit.  Will proceed with hydromorphone 0.5 mg IV x 1  Constipation -likely secondary to opioids.  Abdomen soft.  Patient without nausea or vomiting.  Exam does not appear consistent with obstruction.  Discussed bowel regimen in detail with patient's husband.  Recommended liberalizing MiraLAX to twice daily dosing as well as adding Senokot.  He says that he plans also try a Dulcolax suppository.  Target 1 soft bowel movement daily or every other day.  Follow-up next week for labs/fluid clinic.     Patient expressed understanding and was in agreement with this plan. She also understands that She can call clinic at any time with any questions, concerns, or complaints.   Thank you for allowing me to participate in the care of this very pleasant patient.   Time Total: 20 minutes  Visit consisted of counseling and education dealing with the complex and emotionally intense issues of symptom management in the setting of serious illness.Greater than 50%  of this time was spent counseling and coordinating care related to the above  assessment and plan.  Signed by: Laurette Schimke, PhD, NP-C

## 2023-07-19 ENCOUNTER — Emergency Department: Payer: 59

## 2023-07-19 ENCOUNTER — Inpatient Hospital Stay: Payer: 59

## 2023-07-19 ENCOUNTER — Other Ambulatory Visit: Payer: Self-pay | Admitting: *Deleted

## 2023-07-19 ENCOUNTER — Inpatient Hospital Stay (HOSPITAL_BASED_OUTPATIENT_CLINIC_OR_DEPARTMENT_OTHER): Payer: 59 | Admitting: Hospice and Palliative Medicine

## 2023-07-19 ENCOUNTER — Ambulatory Visit: Payer: 59

## 2023-07-19 ENCOUNTER — Telehealth: Payer: Self-pay

## 2023-07-19 ENCOUNTER — Other Ambulatory Visit: Payer: Self-pay

## 2023-07-19 ENCOUNTER — Inpatient Hospital Stay
Admission: EM | Admit: 2023-07-19 | Discharge: 2023-07-23 | DRG: 054 | Disposition: A | Discharge status: Home Health | Payer: 59 | Source: Ambulatory Visit | Attending: Internal Medicine | Admitting: Internal Medicine

## 2023-07-19 ENCOUNTER — Encounter: Payer: Self-pay | Admitting: Emergency Medicine

## 2023-07-19 DIAGNOSIS — N281 Cyst of kidney, acquired: Secondary | ICD-10-CM | POA: Diagnosis not present

## 2023-07-19 DIAGNOSIS — C3491 Malignant neoplasm of unspecified part of right bronchus or lung: Secondary | ICD-10-CM

## 2023-07-19 DIAGNOSIS — R6510 Systemic inflammatory response syndrome (SIRS) of non-infectious origin without acute organ dysfunction: Secondary | ICD-10-CM | POA: Diagnosis not present

## 2023-07-19 DIAGNOSIS — I959 Hypotension, unspecified: Secondary | ICD-10-CM | POA: Diagnosis not present

## 2023-07-19 DIAGNOSIS — Z833 Family history of diabetes mellitus: Secondary | ICD-10-CM | POA: Diagnosis not present

## 2023-07-19 DIAGNOSIS — C7970 Secondary malignant neoplasm of unspecified adrenal gland: Secondary | ICD-10-CM | POA: Diagnosis not present

## 2023-07-19 DIAGNOSIS — D8989 Other specified disorders involving the immune mechanism, not elsewhere classified: Secondary | ICD-10-CM | POA: Diagnosis not present

## 2023-07-19 DIAGNOSIS — Z8262 Family history of osteoporosis: Secondary | ICD-10-CM

## 2023-07-19 DIAGNOSIS — R5381 Other malaise: Secondary | ICD-10-CM | POA: Diagnosis present

## 2023-07-19 DIAGNOSIS — Z800 Family history of malignant neoplasm of digestive organs: Secondary | ICD-10-CM

## 2023-07-19 DIAGNOSIS — Z66 Do not resuscitate: Secondary | ICD-10-CM | POA: Diagnosis not present

## 2023-07-19 DIAGNOSIS — A419 Sepsis, unspecified organism: Secondary | ICD-10-CM | POA: Diagnosis not present

## 2023-07-19 DIAGNOSIS — Z1152 Encounter for screening for COVID-19: Secondary | ICD-10-CM | POA: Diagnosis not present

## 2023-07-19 DIAGNOSIS — K5900 Constipation, unspecified: Secondary | ICD-10-CM

## 2023-07-19 DIAGNOSIS — S72301A Unspecified fracture of shaft of right femur, initial encounter for closed fracture: Secondary | ICD-10-CM | POA: Diagnosis not present

## 2023-07-19 DIAGNOSIS — D72829 Elevated white blood cell count, unspecified: Secondary | ICD-10-CM | POA: Diagnosis not present

## 2023-07-19 DIAGNOSIS — K5902 Outlet dysfunction constipation: Secondary | ICD-10-CM | POA: Diagnosis not present

## 2023-07-19 DIAGNOSIS — E878 Other disorders of electrolyte and fluid balance, not elsewhere classified: Secondary | ICD-10-CM | POA: Insufficient documentation

## 2023-07-19 DIAGNOSIS — C3490 Malignant neoplasm of unspecified part of unspecified bronchus or lung: Secondary | ICD-10-CM | POA: Diagnosis not present

## 2023-07-19 DIAGNOSIS — C7951 Secondary malignant neoplasm of bone: Secondary | ICD-10-CM | POA: Diagnosis present

## 2023-07-19 DIAGNOSIS — Z8249 Family history of ischemic heart disease and other diseases of the circulatory system: Secondary | ICD-10-CM | POA: Diagnosis not present

## 2023-07-19 DIAGNOSIS — R000 Tachycardia, unspecified: Secondary | ICD-10-CM | POA: Diagnosis present

## 2023-07-19 DIAGNOSIS — E785 Hyperlipidemia, unspecified: Secondary | ICD-10-CM | POA: Diagnosis present

## 2023-07-19 DIAGNOSIS — G9340 Encephalopathy, unspecified: Secondary | ICD-10-CM

## 2023-07-19 DIAGNOSIS — Z87891 Personal history of nicotine dependence: Secondary | ICD-10-CM

## 2023-07-19 DIAGNOSIS — Z515 Encounter for palliative care: Secondary | ICD-10-CM

## 2023-07-19 DIAGNOSIS — G9341 Metabolic encephalopathy: Secondary | ICD-10-CM | POA: Diagnosis not present

## 2023-07-19 DIAGNOSIS — C779 Secondary and unspecified malignant neoplasm of lymph node, unspecified: Secondary | ICD-10-CM | POA: Diagnosis not present

## 2023-07-19 DIAGNOSIS — R9082 White matter disease, unspecified: Secondary | ICD-10-CM | POA: Diagnosis not present

## 2023-07-19 DIAGNOSIS — I10 Essential (primary) hypertension: Secondary | ICD-10-CM | POA: Diagnosis not present

## 2023-07-19 DIAGNOSIS — E236 Other disorders of pituitary gland: Secondary | ICD-10-CM | POA: Diagnosis present

## 2023-07-19 DIAGNOSIS — R4182 Altered mental status, unspecified: Principal | ICD-10-CM

## 2023-07-19 DIAGNOSIS — S7291XA Unspecified fracture of right femur, initial encounter for closed fracture: Secondary | ICD-10-CM | POA: Diagnosis not present

## 2023-07-19 DIAGNOSIS — I2510 Atherosclerotic heart disease of native coronary artery without angina pectoris: Secondary | ICD-10-CM | POA: Diagnosis not present

## 2023-07-19 DIAGNOSIS — C7889 Secondary malignant neoplasm of other digestive organs: Secondary | ICD-10-CM | POA: Diagnosis not present

## 2023-07-19 DIAGNOSIS — C7931 Secondary malignant neoplasm of brain: Principal | ICD-10-CM | POA: Diagnosis present

## 2023-07-19 DIAGNOSIS — I9589 Other hypotension: Secondary | ICD-10-CM | POA: Diagnosis not present

## 2023-07-19 DIAGNOSIS — G893 Neoplasm related pain (acute) (chronic): Secondary | ICD-10-CM | POA: Diagnosis not present

## 2023-07-19 DIAGNOSIS — I6523 Occlusion and stenosis of bilateral carotid arteries: Secondary | ICD-10-CM | POA: Diagnosis not present

## 2023-07-19 DIAGNOSIS — E871 Hypo-osmolality and hyponatremia: Secondary | ICD-10-CM | POA: Diagnosis not present

## 2023-07-19 DIAGNOSIS — Z79899 Other long term (current) drug therapy: Secondary | ICD-10-CM

## 2023-07-19 DIAGNOSIS — Z888 Allergy status to other drugs, medicaments and biological substances status: Secondary | ICD-10-CM

## 2023-07-19 DIAGNOSIS — R911 Solitary pulmonary nodule: Secondary | ICD-10-CM | POA: Diagnosis not present

## 2023-07-19 DIAGNOSIS — Z7189 Other specified counseling: Secondary | ICD-10-CM | POA: Diagnosis not present

## 2023-07-19 DIAGNOSIS — R410 Disorientation, unspecified: Secondary | ICD-10-CM

## 2023-07-19 DIAGNOSIS — Z860100 Personal history of colon polyps, unspecified: Secondary | ICD-10-CM

## 2023-07-19 DIAGNOSIS — B379 Candidiasis, unspecified: Secondary | ICD-10-CM | POA: Diagnosis present

## 2023-07-19 DIAGNOSIS — K5909 Other constipation: Secondary | ICD-10-CM | POA: Diagnosis not present

## 2023-07-19 DIAGNOSIS — R590 Localized enlarged lymph nodes: Secondary | ICD-10-CM | POA: Diagnosis not present

## 2023-07-19 DIAGNOSIS — B370 Candidal stomatitis: Secondary | ICD-10-CM | POA: Diagnosis not present

## 2023-07-19 LAB — CBC WITH DIFFERENTIAL/PLATELET
Abs Immature Granulocytes: 0.31 10*3/uL — ABNORMAL HIGH (ref 0.00–0.07)
Basophils Absolute: 0 10*3/uL (ref 0.0–0.1)
Basophils Relative: 0 %
Eosinophils Absolute: 0 10*3/uL (ref 0.0–0.5)
Eosinophils Relative: 0 %
HCT: 40.3 % (ref 36.0–46.0)
Hemoglobin: 13.3 g/dL (ref 12.0–15.0)
Immature Granulocytes: 2 %
Lymphocytes Relative: 5 %
Lymphs Abs: 0.9 10*3/uL (ref 0.7–4.0)
MCH: 27.7 pg (ref 26.0–34.0)
MCHC: 33 g/dL (ref 30.0–36.0)
MCV: 83.8 fL (ref 80.0–100.0)
Monocytes Absolute: 0.9 10*3/uL (ref 0.1–1.0)
Monocytes Relative: 5 %
Neutro Abs: 17 10*3/uL — ABNORMAL HIGH (ref 1.7–7.7)
Neutrophils Relative %: 88 %
Platelets: 355 10*3/uL (ref 150–400)
RBC: 4.81 MIL/uL (ref 3.87–5.11)
RDW: 16.7 % — ABNORMAL HIGH (ref 11.5–15.5)
WBC: 19.1 10*3/uL — ABNORMAL HIGH (ref 4.0–10.5)
nRBC: 0.1 % (ref 0.0–0.2)

## 2023-07-19 LAB — CMP (CANCER CENTER ONLY)
ALT: 40 U/L (ref 0–44)
AST: 38 U/L (ref 15–41)
Albumin: 3.6 g/dL (ref 3.5–5.0)
Alkaline Phosphatase: 201 U/L — ABNORMAL HIGH (ref 38–126)
Anion gap: 12 (ref 5–15)
BUN: 27 mg/dL — ABNORMAL HIGH (ref 6–20)
CO2: 22 mmol/L (ref 22–32)
Calcium: 8.9 mg/dL (ref 8.9–10.3)
Chloride: 95 mmol/L — ABNORMAL LOW (ref 98–111)
Creatinine: 0.58 mg/dL (ref 0.44–1.00)
GFR, Estimated: >60 mL/min (ref >60)
Glucose, Bld: 122 mg/dL — ABNORMAL HIGH (ref 70–99)
Potassium: 5 mmol/L (ref 3.5–5.1)
Sodium: 129 mmol/L — ABNORMAL LOW (ref 135–145)
Total Bilirubin: 1.4 mg/dL — ABNORMAL HIGH (ref <1.2)
Total Protein: 7.4 g/dL (ref 6.5–8.1)

## 2023-07-19 LAB — RESP PANEL BY RT-PCR (RSV, FLU A&B, COVID)  RVPGX2
Influenza A by PCR: NEGATIVE
Influenza B by PCR: NEGATIVE
Resp Syncytial Virus by PCR: NEGATIVE
SARS Coronavirus 2 by RT PCR: NEGATIVE

## 2023-07-19 LAB — URINALYSIS, W/ REFLEX TO CULTURE (INFECTION SUSPECTED)
Bacteria, UA: NONE SEEN
Bilirubin Urine: NEGATIVE
Glucose, UA: NEGATIVE mg/dL
Hgb urine dipstick: NEGATIVE
Ketones, ur: NEGATIVE mg/dL
Leukocytes,Ua: NEGATIVE
Nitrite: NEGATIVE
Protein, ur: NEGATIVE mg/dL
Specific Gravity, Urine: 1.031 — ABNORMAL HIGH (ref 1.005–1.030)
pH: 6 (ref 5.0–8.0)

## 2023-07-19 LAB — CBC WITH DIFFERENTIAL (CANCER CENTER ONLY)
Abs Immature Granulocytes: 0.26 10*3/uL — ABNORMAL HIGH (ref 0.00–0.07)
Basophils Absolute: 0 10*3/uL (ref 0.0–0.1)
Basophils Relative: 0 %
Eosinophils Absolute: 0 10*3/uL (ref 0.0–0.5)
Eosinophils Relative: 0 %
HCT: 40.3 % (ref 36.0–46.0)
Hemoglobin: 13.2 g/dL (ref 12.0–15.0)
Immature Granulocytes: 1 %
Lymphocytes Relative: 5 %
Lymphs Abs: 0.9 10*3/uL (ref 0.7–4.0)
MCH: 27.8 pg (ref 26.0–34.0)
MCHC: 32.8 g/dL (ref 30.0–36.0)
MCV: 84.8 fL (ref 80.0–100.0)
Monocytes Absolute: 1.2 10*3/uL — ABNORMAL HIGH (ref 0.1–1.0)
Monocytes Relative: 6 %
Neutro Abs: 16.3 10*3/uL — ABNORMAL HIGH (ref 1.7–7.7)
Neutrophils Relative %: 88 %
Platelet Count: 306 10*3/uL (ref 150–400)
RBC: 4.75 MIL/uL (ref 3.87–5.11)
RDW: 16.6 % — ABNORMAL HIGH (ref 11.5–15.5)
WBC Count: 18.6 10*3/uL — ABNORMAL HIGH (ref 4.0–10.5)
nRBC: 0 % (ref 0.0–0.2)

## 2023-07-19 LAB — LACTIC ACID, PLASMA
Lactic Acid, Venous: 3.3 mmol/L (ref 0.5–1.9)
Lactic Acid, Venous: 1.3 mmol/L (ref 0.5–1.9)

## 2023-07-19 LAB — PROTIME-INR
INR: 1.2 (ref 0.8–1.2)
Prothrombin Time: 15 s (ref 11.4–15.2)

## 2023-07-19 LAB — CBG MONITORING, ED: Glucose-Capillary: 113 mg/dL — ABNORMAL HIGH (ref 70–99)

## 2023-07-19 LAB — COMPREHENSIVE METABOLIC PANEL
ALT: 41 U/L (ref 0–44)
AST: 37 U/L (ref 15–41)
Albumin: 3.6 g/dL (ref 3.5–5.0)
Alkaline Phosphatase: 219 U/L — ABNORMAL HIGH (ref 38–126)
Anion gap: 12 (ref 5–15)
BUN: 28 mg/dL — ABNORMAL HIGH (ref 6–20)
CO2: 21 mmol/L — ABNORMAL LOW (ref 22–32)
Calcium: 8.6 mg/dL — ABNORMAL LOW (ref 8.9–10.3)
Chloride: 96 mmol/L — ABNORMAL LOW (ref 98–111)
Creatinine, Ser: 0.59 mg/dL (ref 0.44–1.00)
GFR, Estimated: >60 mL/min (ref >60)
Glucose, Bld: 150 mg/dL — ABNORMAL HIGH (ref 70–99)
Potassium: 4.7 mmol/L (ref 3.5–5.1)
Sodium: 129 mmol/L — ABNORMAL LOW (ref 135–145)
Total Bilirubin: 1.6 mg/dL — ABNORMAL HIGH (ref <1.2)
Total Protein: 7.4 g/dL (ref 6.5–8.1)

## 2023-07-19 LAB — APTT: aPTT: 27 s (ref 24–36)

## 2023-07-19 MED ORDER — ACETAMINOPHEN 325 MG PO TABS
650.0000 mg | ORAL_TABLET | Freq: Four times a day (QID) | ORAL | Status: DC | PRN
  Administered 2023-07-20 – 2023-07-22 (×2): 650 mg via ORAL
  Filled 2023-07-19 (×2): qty 2

## 2023-07-19 MED ORDER — SODIUM CHLORIDE 0.9% FLUSH
3.0000 mL | Freq: Two times a day (BID) | INTRAVENOUS | Status: DC
  Administered 2023-07-19 – 2023-07-23 (×8): 3 mL via INTRAVENOUS

## 2023-07-19 MED ORDER — HYDROCORTISONE 10 MG PO TABS
10.0000 mg | ORAL_TABLET | Freq: Every day | ORAL | Status: DC
  Administered 2023-07-20 – 2023-07-22 (×3): 10 mg via ORAL
  Filled 2023-07-19 (×3): qty 1

## 2023-07-19 MED ORDER — VANCOMYCIN HCL IN DEXTROSE 1-5 GM/200ML-% IV SOLN: 1000.0000 mg | Freq: Once | INTRAVENOUS | Status: DC

## 2023-07-19 MED ORDER — SODIUM CHLORIDE 0.9 % IV BOLUS
500.0000 mL | Freq: Once | INTRAVENOUS | Status: AC
  Administered 2023-07-19: 500 mL via INTRAVENOUS

## 2023-07-19 MED ORDER — HEPARIN SOD (PORK) LOCK FLUSH 100 UNIT/ML IV SOLN
500.0000 [IU] | Freq: Once | INTRAVENOUS | Status: DC
  Filled 2023-07-19: qty 5

## 2023-07-19 MED ORDER — VANCOMYCIN HCL 1750 MG/350ML IV SOLN
1750.0000 mg | Freq: Once | INTRAVENOUS | Status: AC
  Administered 2023-07-19: 1750 mg via INTRAVENOUS
  Filled 2023-07-19: qty 350

## 2023-07-19 MED ORDER — ENOXAPARIN SODIUM 40 MG/0.4ML IJ SOSY
40.0000 mg | PREFILLED_SYRINGE | INTRAMUSCULAR | Status: DC
  Administered 2023-07-19 – 2023-07-22 (×4): 40 mg via SUBCUTANEOUS
  Filled 2023-07-19 (×4): qty 0.4

## 2023-07-19 MED ORDER — SODIUM CHLORIDE 0.9 % IV BOLUS
1000.0000 mL | Freq: Once | INTRAVENOUS | Status: AC
  Administered 2023-07-19: 1000 mL via INTRAVENOUS

## 2023-07-19 MED ORDER — ONDANSETRON HCL 4 MG/2ML IJ SOLN: 4.0000 mg | Freq: Four times a day (QID) | INTRAMUSCULAR | Status: DC | PRN

## 2023-07-19 MED ORDER — CITALOPRAM HYDROBROMIDE 20 MG PO TABS
10.0000 mg | ORAL_TABLET | Freq: Every day | ORAL | Status: DC
  Administered 2023-07-20 – 2023-07-23 (×4): 10 mg via ORAL
  Filled 2023-07-19 (×5): qty 1

## 2023-07-19 MED ORDER — HYDROCORTISONE SOD SUC (PF) 100 MG IJ SOLR
100.0000 mg | Freq: Once | INTRAMUSCULAR | Status: AC
  Administered 2023-07-19: 100 mg via INTRAVENOUS
  Filled 2023-07-19: qty 2

## 2023-07-19 MED ORDER — ONDANSETRON HCL 4 MG PO TABS: 4.0000 mg | ORAL_TABLET | Freq: Four times a day (QID) | ORAL | Status: DC | PRN

## 2023-07-19 MED ORDER — IOHEXOL 350 MG/ML SOLN
100.0000 mL | Freq: Once | INTRAVENOUS | Status: AC | PRN
  Administered 2023-07-19: 100 mL via INTRAVENOUS

## 2023-07-19 MED ORDER — HYDROMORPHONE HCL 2 MG PO TABS
2.0000 mg | ORAL_TABLET | ORAL | Status: DC | PRN
  Administered 2023-07-20 – 2023-07-21 (×5): 4 mg via ORAL
  Administered 2023-07-22 (×2): 2 mg via ORAL
  Administered 2023-07-22 – 2023-07-23 (×2): 4 mg via ORAL
  Filled 2023-07-19 (×7): qty 2
  Filled 2023-07-19: qty 1
  Filled 2023-07-19 (×2): qty 2

## 2023-07-19 MED ORDER — SODIUM CHLORIDE 0.9% FLUSH
10.0000 mL | Freq: Once | INTRAVENOUS | Status: AC
  Administered 2023-07-19: 10 mL via INTRAVENOUS
  Filled 2023-07-19: qty 10

## 2023-07-19 MED ORDER — LEVETIRACETAM 500 MG PO TABS
500.0000 mg | ORAL_TABLET | Freq: Two times a day (BID) | ORAL | Status: DC
  Administered 2023-07-19 – 2023-07-23 (×8): 500 mg via ORAL
  Filled 2023-07-19 (×8): qty 1

## 2023-07-19 MED ORDER — FENTANYL CITRATE PF 50 MCG/ML IJ SOSY
50.0000 ug | PREFILLED_SYRINGE | Freq: Once | INTRAMUSCULAR | Status: AC
  Administered 2023-07-19: 50 ug via INTRAVENOUS
  Filled 2023-07-19: qty 1

## 2023-07-19 MED ORDER — SODIUM CHLORIDE 0.9 % IV SOLN
2.0000 g | Freq: Once | INTRAVENOUS | Status: AC
  Administered 2023-07-19: 2 g via INTRAVENOUS
  Filled 2023-07-19: qty 12.5

## 2023-07-19 MED ORDER — ALPRAZOLAM 0.5 MG PO TABS
0.5000 mg | ORAL_TABLET | Freq: Every day | ORAL | Status: DC | PRN
  Administered 2023-07-21: 0.5 mg via ORAL
  Filled 2023-07-19: qty 1

## 2023-07-19 MED ORDER — DEXAMETHASONE 4 MG PO TABS
4.0000 mg | ORAL_TABLET | Freq: Two times a day (BID) | ORAL | Status: DC
  Administered 2023-07-19 – 2023-07-22 (×6): 4 mg via ORAL
  Filled 2023-07-19 (×6): qty 1

## 2023-07-19 MED ORDER — HYDROCORTISONE 10 MG PO TABS
20.0000 mg | ORAL_TABLET | Freq: Every morning | ORAL | Status: DC
  Administered 2023-07-20 – 2023-07-23 (×4): 20 mg via ORAL
  Filled 2023-07-19 (×4): qty 2

## 2023-07-19 MED ORDER — ACETAMINOPHEN 650 MG RE SUPP: 650.0000 mg | Freq: Four times a day (QID) | RECTAL | Status: DC | PRN

## 2023-07-19 MED ORDER — METRONIDAZOLE 500 MG/100ML IV SOLN
500.0000 mg | Freq: Once | INTRAVENOUS | Status: AC
Start: 2023-07-19 — End: 2023-07-19
  Administered 2023-07-19: 500 mg via INTRAVENOUS
  Filled 2023-07-19: qty 100

## 2023-07-19 MED ORDER — FENTANYL 25 MCG/HR TD PT72
1.0000 | MEDICATED_PATCH | TRANSDERMAL | Status: DC
  Administered 2023-07-20 – 2023-07-23 (×2): 1 via TRANSDERMAL
  Filled 2023-07-19 (×2): qty 1

## 2023-07-19 MED ORDER — MAGIC MOUTHWASH W/LIDOCAINE
5.0000 mL | Freq: Four times a day (QID) | ORAL | Status: DC
  Administered 2023-07-19 – 2023-07-23 (×10): 5 mL via ORAL
  Filled 2023-07-19 (×16): qty 5

## 2023-07-19 NOTE — Assessment & Plan Note (Addendum)
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  Appreciate oncology consultation.  Overall prognosis is poor.  Waiting for hospital bed to be delivered.  Decrease Decadron dose.

## 2023-07-19 NOTE — Assessment & Plan Note (Addendum)
Continue usual Cortef.

## 2023-07-19 NOTE — Telephone Encounter (Signed)
Patients husband left message wanting to speak to Cordelia Pen, states he has questions about Yamilee

## 2023-07-19 NOTE — Assessment & Plan Note (Addendum)
Continue fentanyl patch and Dilaudid (patient has these medications at home)

## 2023-07-19 NOTE — ED Notes (Signed)
First nurse note: To ED from cancer center for symptom mgmt appt Noted to have AMS and low BP Only answering own name to all orientation questions 50/40 BP, HR 110, 95% on RA, temp 93 CBC and CMP drawn and pending at cancer center today Pt has known brain metastasis from lung cancer Port to R chest, currently accessed

## 2023-07-19 NOTE — Consult Note (Signed)
PHARMACY -  BRIEF ANTIBIOTIC NOTE   Pharmacy has received consult(s) for Vancomycin and Cefepime from an ED provider.  The patient's profile has been reviewed for ht/wt/allergies/indication/available labs.    One time order(s) placed for Vancomycin 1750mg  IV and Cefepime 2g IV.  Further antibiotics/pharmacy consults should be ordered by admitting physician if indicated.                       Thank you, Bettey Costa 07/19/2023  3:36 PM

## 2023-07-19 NOTE — Assessment & Plan Note (Addendum)
Hyponatremia with sodium 1 point less than normal range.  Encouraged eating.  Patient is drinking Ensure even though she is not eating that much.

## 2023-07-19 NOTE — Assessment & Plan Note (Addendum)
White blood cell count down to 12.5

## 2023-07-19 NOTE — Progress Notes (Signed)
Pt here for altered mental status per husband. Upon arrival BP 52/43, HR 110. Pt restless and in pain. Port accessed, labs obtained. Josh Borders, NP in to see pt. Per Sharia Reeve pt to be transported to ED for further evaluation. Escorted to ED by staff via wheelchair.

## 2023-07-19 NOTE — H&P (Signed)
History and Physical    Patient: Gina Sanchez EXB:[id number] DOB: 1966/02/20 DOA: 07/19/2023 DOS: the patient was seen and examined on 07/19/2023 PCP: Armando Gang, FNP  Patient coming from: Home  Chief Complaint:  Chief Complaint  Patient presents with   Altered Mental Status   HPI: Gina Sanchez is a 57 y.o. 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  History obtained from patient's daughter and husband at bedside due to patient's altered mental status.  They state that since patient was discharged from the hospital on 07/09/2023, she started whole brain radiation and since then, they have noticed a decline in her functional status.  Over the last several days, she has been more confused although they state confusion initially began 3 weeks ago, it has become more pronounced since starting radiation.  During this time, she has had very poor p.o. intake.  Anytime she tries to stand, she cries out due to left leg pain.  They have not noticed any cough or shortness of breath, vomiting, abdominal pain.  ED course: At oncologist office, patient's blood pressure was noted to be 52/43 with heart rate of 110.  She was hypothermic 93 using a tympanic monitor.  On transfer to the ED, repeat vitals demonstrated improvement in blood pressure at 95/65 with heart rate of 98 (without any intervention).  Rectal temperature 96.6.  She was saturating at 100% on room air.  Initial workup notable for WBC of 19.1, sodium 129, bicarb 21, glucose 150, BUN 28, creatinine 0.59, alkaline phosphatase 219 and GFR above 60.  Lactic acid 3.3 with improvement to 1.3.  Urinalysis negative.  COVID-19, influenza and RSV PCR negative.  CT of the head, CTA chest, and CT  abdomen were obtained with no acute abnormalities noted. Patient started on IV fluids, IV Solu-Cortef, cefepime, vancomycin and Flagyl. TRH contacted for admission.   Review of Systems: unable to review all systems due to the inability of the patient to answer questions.  Past Medical History:  Diagnosis Date   Abnormal Pap smear of cervix    Anxiety    C. 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   Metastatic cancer (HCC) 10/2020   lung, bone, lymph node, adrenal   Palpitations    Precordial chest pain    Wrist fracture 03/2021   left   Past Surgical History:  Procedure Laterality Date   BREAST BIOPSY Right 2017   benign   CERVICAL BIOPSY  W/ LOOP ELECTRODE EXCISION     COLONOSCOPY  06/11/2022   Procedure: COLONOSCOPY;  Surgeon: Toney Reil, MD;  Location: [facility] ENDOSCOPY;  Service: Gastroenterology;;   COLONOSCOPY WITH PROPOFOL N/A 07/03/2015   Procedure: COLONOSCOPY WITH PROPOFOL;  Surgeon: Wallace Cullens, MD;  Location: [facility] ENDOSCOPY;  Service: Gastroenterology;  Laterality: N/A;   COLONOSCOPY WITH PROPOFOL N/A 01/06/2022  Procedure: COLONOSCOPY WITH PROPOFOL;  Surgeon: Toney Reil, MD;  Location: [facility] ENDOSCOPY;  Service: Gastroenterology;  Laterality: N/A;   ESOPHAGOGASTRODUODENOSCOPY (EGD) WITH PROPOFOL N/A 07/03/2015   Procedure: ESOPHAGOGASTRODUODENOSCOPY (EGD) WITH PROPOFOL;  Surgeon: Wallace Cullens, MD;  Location: [facility] ENDOSCOPY;  Service: Gastroenterology;  Laterality: N/A;   FEMUR IM NAIL Left 06/30/2023   Procedure: Left prophylactic intramedullary nailing of impending left  subtrochanteric femur fracture;  Surgeon: Christena Flake, MD;  Location: [facility] ORS;  Service: Orthopedics;  Laterality: Left;   FLEXIBLE SIGMOIDOSCOPY N/A 03/12/2022   Procedure: FLEXIBLE SIGMOIDOSCOPY;  Surgeon: Toney Reil, MD;  Location: [facility] ENDOSCOPY;  Service: Gastroenterology;  Laterality: N/A;   FLEXIBLE SIGMOIDOSCOPY N/A 08/17/2022   Procedure: FLEXIBLE SIGMOIDOSCOPY;  Surgeon: Toney Reil, MD;  Location: [facility] ENDOSCOPY;  Service: Gastroenterology;  Laterality: N/A;   INTRAMEDULLARY (IM) NAIL INTERTROCHANTERIC Right 09/15/2020   Procedure: INTRAMEDULLARY (IM) NAIL INTERTROCHANTRIC;  Surgeon: Christena Flake, MD;  Location: [facility] ORS;  Service: Orthopedics;  Laterality: Right;   IR CV LINE INJECTION  07/14/2022   IR IMAGING GUIDED PORT INSERTION  11/28/2020   LEFT HEART CATH AND CORONARY ANGIOGRAPHY N/A 03/28/2017   Procedure: Left Heart Cath and Coronary Angiography;  Surgeon: Runell Gess, MD;  Location: [facility] INVASIVE CV LAB;  Service: Cardiovascular;  Laterality: N/A;   ORIF WRIST FRACTURE Left 03/31/2021   Procedure: OPEN REDUCTION INTERNAL FIXATION (ORIF) LEFT DISTAL RADIUS FRACTURE.;  Surgeon: Christena Flake, MD;  Location: [facility] ORS;  Service: Orthopedics;  Laterality: Left;   Social History:  reports that she quit smoking about 2 years ago. Her smoking use included cigarettes. She started smoking about 32 years ago. She has a 30 pack-year smoking history. She has never used smokeless tobacco. She reports that she does not currently use alcohol after a past usage of about 1.0 standard drink of alcohol per week. She reports that she does not use drugs.  Allergies  Allergen Reactions   Factive [Gemifloxacin] Rash    Family History  Problem Relation Age of Onset   Diabetes Mother        alive @ 50   Goiter Mother    Heart disease Father        died of MI @ 38   Osteoporosis Maternal Grandmother    Colon cancer Maternal Grandfather    Breast cancer Neg Hx     Ovarian cancer Neg Hx     Prior to Admission medications   Medication Sig Start Date End Date Taking? Authorizing Provider  acetaminophen (TYLENOL) 500 MG tablet Take 500 mg by mouth every 6 (six) hours as needed.    [provider]  ALPRAZolam (XANAX) 0.5 MG tablet TAKE 1 TABLET BY MOUTH DAILY AS NEEDED FOR ANXIETY. 06/01/23   Creig Hines, MD  citalopram (CELEXA) 10 MG tablet Take 1 tablet (10 mg total) by mouth daily. 07/14/23   Borders, Daryl Eastern, NP  dexamethasone (DECADRON) 4 MG tablet Take 1 tablet (4 mg total) by mouth 2 (two) times daily. 07/09/23 08/08/23  Darlin Priestly, MD  fentaNYL (DURAGESIC) 25 MCG/HR Place 1 patch onto the skin every 3 (three) days. 06/20/23   Borders, Daryl Eastern, NP  HYDROcodone bit-homatropine (HYCODAN) 5-1.5 MG/5ML syrup Take 5 mLs by mouth every 6 (six) hours as needed for cough. Patient not taking: Reported on 07/18/2023 07/14/23   Borders, Daryl Eastern, NP  hydrocortisone (CORTEF) 10 MG tablet Take 1 tablet (10 mg total) by mouth at bedtime. 07/14/23  Borders, Daryl Eastern, NP  hydrocortisone (CORTEF) 20 MG tablet Take 1 tablet (20 mg total) by mouth every morning. 07/14/23   Borders, Daryl Eastern, NP  HYDROmorphone (DILAUDID) 2 MG tablet Take 1-2 tablets (2-4 mg total) by mouth every 4 (four) hours as needed for severe pain (pain score 7-10) or moderate pain (pain score 4-6). 07/09/23   Darlin Priestly, MD  HYDROmorphone (DILAUDID) 2 MG tablet Take 1-2 tablets (2-4 mg total) by mouth every 4 (four) hours as needed for severe pain (pain score 7-10). 07/18/23   Borders, Daryl Eastern, NP  levETIRAcetam (KEPPRA) 500 MG tablet Take 1 tablet (500 mg total) by mouth 2 (two) times daily. 07/09/23   Darlin Priestly, MD  lidocaine (LIDODERM) 5 % Place 1 patch onto the skin daily. Remove & Discard patch within 12 hours or as directed by MD 07/14/23   Borders, Daryl Eastern, NP  metoprolol succinate (TOPROL-XL) 25 MG 24 hr tablet Take 1 tablet (25 mg total) by mouth daily. 07/03/23   Emeline General, MD   ondansetron (ZOFRAN) 4 MG tablet TAKE 1 TABLET BY MOUTH EVERY 8 HOURS AS NEEDED FOR NAUSEA AND VOMITING Patient taking differently: Take 4 mg by mouth every 8 (eight) hours as needed for nausea or vomiting. TAKE 1 TABLET BY MOUTH EVERY 8 HOURS AS NEEDED FOR NAUSEA AND VOMITING 12/29/22   Rushie Chestnut, PA-C  polyethylene glycol powder (GLYCOLAX/MIRALAX) 17 GM/SCOOP powder Take 1 Container by mouth once.    [provider]  prochlorperazine (COMPAZINE) 10 MG tablet Take 1 tablet (10 mg total) by mouth every 6 (six) hours as needed for nausea or vomiting. 12/17/22   Creig Hines, MD  Vedolizumab (ENTYVIO) 108 MG/0.68ML SOPN Inject 1 Pen into the skin every 14 (fourteen) days. Patient taking differently: Inject 1 Pen into the skin every 30 (thirty) days. 05/26/23   Toney Reil, MD    Physical Exam: Vitals:   07/19/23 1846 07/19/23 2015 07/19/23 2024 07/19/23 2109  BP:  (!) 119/95  (!) 145/97  Pulse:  81  83  Resp:  15  18  Temp: (!) 97.5 F (36.4 C)  (!) 97.5 F (36.4 C) 97.6 F (36.4 C)  TempSrc: Oral  Rectal Oral  SpO2:  100%  100%  Weight:      Height:       Physical Exam Vitals and nursing note reviewed.  Constitutional:      Comments: Lethargic, but awakens easily  HENT:     Head: Normocephalic and atraumatic.     Mouth/Throat:     Mouth: Mucous membranes are dry.     Comments: White film noted throughout consistent with severe thrush Eyes:     Conjunctiva/sclera: Conjunctivae normal.     Pupils: Pupils are equal, round, and reactive to light.  Cardiovascular:     Rate and Rhythm: Normal rate and regular rhythm.     Heart sounds: No murmur heard. Pulmonary:     Effort: Pulmonary effort is normal.  Abdominal:     General: Bowel sounds are normal.     Palpations: Abdomen is soft.  Musculoskeletal:     Right lower leg: No edema.     Left lower leg: No edema.  Skin:    General: Skin is warm and dry.     Comments: Incisions on superior lateral left  thigh appear to be healing well without any surrounding erythema or purulent drainage  Neurological:     Mental Status: She is alert.  Comments:  Patient is alert to self but not situation.  Repeats statements and unable to answer questions appropriately.  Following commands though.  Moving all extremities against gravity.  No tremor    Data Reviewed: CBC with WBC of 19.1, hemoglobin of 13.3, MCV of 83.8 and platelets of 355 CMP with sodium of 129, potassium 4.7, bicarb 21, BUN 28, creatinine 0.58, alkaline phosphatase 219, AST 37, ALT 41 and GFR above 60 Initial lactic acid of 3.3 with improvement to 1.3 INR 1.2 Urinalysis with negative leukocytes, nitrites, bacteria or WBC Negative COVID-19, influenza and RSV PCR  EKG personally reviewed.  Sinus rhythm with rate of 107.  Nonspecific repolarization changes  CT ABDOMEN PELVIS W CONTRAST  Result Date: 07/19/2023 CLINICAL DATA:  Pulmonary embolism, hypertension, altered mental status EXAM: CT ANGIOGRAPHY CHEST CT ABDOMEN AND PELVIS WITH CONTRAST TECHNIQUE: Multidetector CT imaging of the chest was performed using the standard protocol during bolus administration of intravenous contrast. Multiplanar CT image reconstructions and MIPs were obtained to evaluate the vascular anatomy. Multidetector CT imaging of the abdomen and pelvis was performed using the standard protocol during bolus administration of intravenous contrast. RADIATION DOSE REDUCTION: This exam was performed according to the departmental dose-optimization program which includes automated exposure control, adjustment of the mA and/or kV according to patient size and/or use of iterative reconstruction technique. CONTRAST:  OMNIPAQUE IOHEXOL 350 MG/ML SOLN COMPARISON:  07/05/2023 FINDINGS: CTA CHEST FINDINGS Cardiovascular: Minimal coronary artery calcification. Global cardiac size within normal limits. No pericardial effusion. Central pulmonary arteries are adequately opacified  and there is no intraluminal filling defect identified through the segmental level. Central pulmonary arteries are of normal caliber. The thoracic aorta is unremarkable. Right internal jugular chest port tip is seen within the right atrium. Mediastinum/Nodes: There is infiltrative soft tissue within the right paratracheal space, best seen on axial image # 45/4, which is stable since prior examination and is associated with narrowing of the superior vena cava in this region. This reflects the sequela of treated disease, better seen on PET CT examination of 10/02/2020. Borderline left pre-vascular lymph node is stable, nonspecific. No new pathologic thoracic adenopathy. Esophagus is unremarkable. Visualized thyroid is unremarkable. Lungs/Pleura: Previously noted focal consolidation within the a posterior right upper lobe and scattered ground-glass infiltrate within the right apex has improved in the interval in keeping with interval resolving infectious or inflammatory process. Residual scarring within the right upper lobe and right lower lobe noted. Mild emphysema. Stable 7 mm pulmonary nodule within the subpleural left upper lobe, axial image # 50/6, indeterminate. While stable since immediate prior examination, this first appeared on prior examination of 04/19/2023 and is indeterminate. No new focal pulmonary nodules or infiltrates. No pneumothorax or pleural effusion. Central airways are widely patent. Musculoskeletal: Widespread sclerotic metastases are again seen throughout the visualized axial skeleton, grossly stable since prior examination. No pathologic fracture. Review of the MIP images confirms the above findings. CT ABDOMEN and PELVIS FINDINGS Hepatobiliary: No focal liver abnormality is seen. No gallstones, gallbladder wall thickening, or biliary dilatation. Pancreas: Unremarkable Spleen: 19 and 16 mm hypoenhancing subcapsular masses within the spleen are stable since prior examination, compatible with  splenic metastases. No new intrasplenic lesions. The spleen is of normal size. Adrenals/Urinary Tract: Bilateral adrenal metastases are grossly stable measuring 2.2 x 5.3 cm on the right and 1.3 x 2.5 cm on the left. The kidneys are unremarkable save for a simple cortical cyst within the lower pole the left kidney for which no specific  follow-up imaging is recommended. The bladder is unremarkable. Stomach/Bowel: Stomach is within normal limits. Appendix appears normal. No evidence of bowel wall thickening, distention, or inflammatory changes. Vascular/Lymphatic: Aortic atherosclerosis. No enlarged abdominal or pelvic lymph nodes. Reproductive: Uterus and bilateral adnexa are unremarkable. Other: No abdominal wall hernia or abnormality. No abdominopelvic ascites. Musculoskeletal: Bilateral hip ORIF has been performed. Widespread sclerotic metastases are again seen throughout the visualized axial skeleton, grossly stable since prior examination. No pathologic fracture identified. Review of the MIP images confirms the above findings. IMPRESSION: 1. No pulmonary embolism. No acute intrathoracic pathology identified. 2. Minimal coronary artery calcification. 3. Stable infiltrative soft tissue within the right paratracheal space, associated with narrowing of the superior vena cava in this region. This reflects the sequela of treated disease, better seen on PET CT examination of 10/02/2020. 4. Stable 7 mm pulmonary nodule within the subpleural left upper lobe, indeterminate. While stable since immediate prior examination, this first appeared on prior examination of 04/19/2023 and a a isolated pulmonary metastasis is not excluded. Close observation on subsequent surveillance examination is warranted. 5. Widespread sclerotic metastases throughout the visualized axial skeleton, grossly stable since prior examination. No pathologic fracture identified. 6. Stable bilateral adrenal metastases. 7. Stable splenic metastases.  Aortic Atherosclerosis (ICD10-I70.0) and Emphysema (ICD10-J43.9). Electronically Signed   By: Helyn Numbers M.D.   On: 07/19/2023 18:54   CT Angio Chest PE W and/or Wo Contrast  Result Date: 07/19/2023 CLINICAL DATA:  Pulmonary embolism, hypertension, altered mental status EXAM: CT ANGIOGRAPHY CHEST CT ABDOMEN AND PELVIS WITH CONTRAST TECHNIQUE: Multidetector CT imaging of the chest was performed using the standard protocol during bolus administration of intravenous contrast. Multiplanar CT image reconstructions and MIPs were obtained to evaluate the vascular anatomy. Multidetector CT imaging of the abdomen and pelvis was performed using the standard protocol during bolus administration of intravenous contrast. RADIATION DOSE REDUCTION: This exam was performed according to the departmental dose-optimization program which includes automated exposure control, adjustment of the mA and/or kV according to patient size and/or use of iterative reconstruction technique. CONTRAST:  OMNIPAQUE IOHEXOL 350 MG/ML SOLN COMPARISON:  07/05/2023 FINDINGS: CTA CHEST FINDINGS Cardiovascular: Minimal coronary artery calcification. Global cardiac size within normal limits. No pericardial effusion. Central pulmonary arteries are adequately opacified and there is no intraluminal filling defect identified through the segmental level. Central pulmonary arteries are of normal caliber. The thoracic aorta is unremarkable. Right internal jugular chest port tip is seen within the right atrium. Mediastinum/Nodes: There is infiltrative soft tissue within the right paratracheal space, best seen on axial image # 45/4, which is stable since prior examination and is associated with narrowing of the superior vena cava in this region. This reflects the sequela of treated disease, better seen on PET CT examination of 10/02/2020. Borderline left pre-vascular lymph node is stable, nonspecific. No new pathologic thoracic adenopathy. Esophagus is  unremarkable. Visualized thyroid is unremarkable. Lungs/Pleura: Previously noted focal consolidation within the a posterior right upper lobe and scattered ground-glass infiltrate within the right apex has improved in the interval in keeping with interval resolving infectious or inflammatory process. Residual scarring within the right upper lobe and right lower lobe noted. Mild emphysema. Stable 7 mm pulmonary nodule within the subpleural left upper lobe, axial image # 50/6, indeterminate. While stable since immediate prior examination, this first appeared on prior examination of 04/19/2023 and is indeterminate. No new focal pulmonary nodules or infiltrates. No pneumothorax or pleural effusion. Central airways are widely patent. Musculoskeletal: Widespread sclerotic metastases are again  seen throughout the visualized axial skeleton, grossly stable since prior examination. No pathologic fracture. Review of the MIP images confirms the above findings. CT ABDOMEN and PELVIS FINDINGS Hepatobiliary: No focal liver abnormality is seen. No gallstones, gallbladder wall thickening, or biliary dilatation. Pancreas: Unremarkable Spleen: 19 and 16 mm hypoenhancing subcapsular masses within the spleen are stable since prior examination, compatible with splenic metastases. No new intrasplenic lesions. The spleen is of normal size. Adrenals/Urinary Tract: Bilateral adrenal metastases are grossly stable measuring 2.2 x 5.3 cm on the right and 1.3 x 2.5 cm on the left. The kidneys are unremarkable save for a simple cortical cyst within the lower pole the left kidney for which no specific follow-up imaging is recommended. The bladder is unremarkable. Stomach/Bowel: Stomach is within normal limits. Appendix appears normal. No evidence of bowel wall thickening, distention, or inflammatory changes. Vascular/Lymphatic: Aortic atherosclerosis. No enlarged abdominal or pelvic lymph nodes. Reproductive: Uterus and bilateral adnexa are  unremarkable. Other: No abdominal wall hernia or abnormality. No abdominopelvic ascites. Musculoskeletal: Bilateral hip ORIF has been performed. Widespread sclerotic metastases are again seen throughout the visualized axial skeleton, grossly stable since prior examination. No pathologic fracture identified. Review of the MIP images confirms the above findings. IMPRESSION: 1. No pulmonary embolism. No acute intrathoracic pathology identified. 2. Minimal coronary artery calcification. 3. Stable infiltrative soft tissue within the right paratracheal space, associated with narrowing of the superior vena cava in this region. This reflects the sequela of treated disease, better seen on PET CT examination of 10/02/2020. 4. Stable 7 mm pulmonary nodule within the subpleural left upper lobe, indeterminate. While stable since immediate prior examination, this first appeared on prior examination of 04/19/2023 and a a isolated pulmonary metastasis is not excluded. Close observation on subsequent surveillance examination is warranted. 5. Widespread sclerotic metastases throughout the visualized axial skeleton, grossly stable since prior examination. No pathologic fracture identified. 6. Stable bilateral adrenal metastases. 7. Stable splenic metastases. Aortic Atherosclerosis (ICD10-I70.0) and Emphysema (ICD10-J43.9). Electronically Signed   By: Helyn Numbers M.D.   On: 07/19/2023 18:54   DG Chest Port 1 View  Result Date: 07/19/2023 CLINICAL DATA:  Sepsis EXAM: PORTABLE CHEST 1 VIEW COMPARISON:  07/02/2023 FINDINGS: Lungs are clear. No pneumothorax or pleural effusion. Right internal jugular chest port tip seen within the right atrium. Cardiac size within normal limits. Pulmonary vascularity is normal. No acute bone abnormality. IMPRESSION: 1. No active disease. Electronically Signed   By: Helyn Numbers M.D.   On: 07/19/2023 18:25   CT Head Wo Contrast  Result Date: 07/19/2023 CLINICAL DATA:  Hypotension and altered  mental status EXAM: CT HEAD WITHOUT CONTRAST TECHNIQUE: Contiguous axial images were obtained from the base of the skull through the vertex without intravenous contrast. RADIATION DOSE REDUCTION: This exam was performed according to the departmental dose-optimization program which includes automated exposure control, adjustment of the mA and/or kV according to patient size and/or use of iterative reconstruction technique. COMPARISON:  Brain MRI 07/05/2023 FINDINGS: Brain: There is no acute intracranial hemorrhage, extra-axial fluid collection, or acute infarct. Parenchymal volume is normal. The ventricles are normal in size. Gray-white differentiation is preserved. The pituitary and suprasellar region are normal. Small hyperdense lesions are seen in the left frontal subcortical white matter and right peritrigonal white matter corresponding to known metastatic lesions, better evaluated on recent brain MRI. There is no parenchymal edema or mass effect. There is no midline shift. Vascular: There is calcification of the bilateral carotid siphons. Skull: Normal. Negative for fracture or  focal lesion. Sinuses/Orbits: The imaged paranasal sinuses are clear. The globes and orbits are unremarkable. Other: The mastoid air cells and middle ear cavities are clear. IMPRESSION: 1. No acute intracranial pathology. 2. No parenchymal edema or mass effect related to the known intracranial metastatic disease which was better seen on recent brain MRI. Electronically Signed   By: Lesia Hausen M.D.   On: 07/19/2023 17:27    Results are pending, will review when available.  Assessment and Plan:  * SIRS (systemic inflammatory response syndrome) (HCC) Patient presented to her oncologist office earlier today with notable hypotension, hypothermia, tachycardia and elevated WBC.  Blood pressure was rechecked on arrival to the ER without any interventions and blood pressure improved to 95/65, so wonder if initial blood pressure was  accurate.  Tachycardia has resolved with IV fluids and temperature has improved to normal. Given over the last several days, patient has had extremely poor p.o. intake and has started whole brain radiation, I suspect this not related to infection.  No localizing infectious symptoms noted and workup so far has been negative.  - Telemetry monitoring - S/p 2.5 L of normal saline - Continue maintenance fluids if patient is unable to tolerate p.o. intake - S/p vancomycin, cefepime and Flagyl - Hold off on further IV fluids at this time - Blood cultures pending - Full respiratory viral panel  Acute encephalopathy Patient's family notes that her mental status has gradually worsened over the last 3 weeks and declined when she started whole brain radiation.  Over the last couple days though it has been relatively stable with fluctuating disorientation throughout the day and fatigue.  - Bedside swallow study prior to initiating p.o. intake - Will consider MRI of the brain if any changes are noted - Will need EEG if seizure is noted or suspected - Continue home Keppra for seizure prophylaxis - Continue home Decadron  Leukocytosis Persistent leukocytosis since starting whole brain radiation.  Workup for infectious etiology including urinalysis, COVID-19/RSV/influenza PCR, and CT pan-scan has been negative.  - Daily CBC - Blood cultures pending  Autoimmune hypophysitis (HCC) - S/p hydrocortisone 100 mg IV once - Resume home hydrocortisone in the a.m.  Electrolyte abnormality In the setting of poor p.o. intake.  - Electrolyte monitoring per pharmacy protocol - Dietitian consulted  Cancer related pain - Resume home regimen when tolerating p.o. intake  Sinus tachycardia - Hold home metoprolol for now  Goals of care, counseling/discussion On discussion with patient's husband regarding CODE STATUS, he initially opted for full code.  We had an in-depth discussion of CPR and what it entails,  including traumatic injuries.  He said that he would not want his wife to suffer any further and due to this, he would not want CPR for her but would want all prearrest interventions including intubation.  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    - Consult oncology in a.m.  Advance Care Planning:   Code Status: Do not attempt resuscitation (DNR) PRE-ARREST INTERVENTIONS DESIRED please see goals of care discussion noted above  Consults: None  Family Communication: Patient's husband and daughter updated at bedside  Severity of Illness: The appropriate patient status for this patient is INPATIENT. Inpatient status is judged to be reasonable and necessary in order to provide the required intensity of service to ensure the patient's safety. The patient's presenting symptoms,  physical exam findings, and initial radiographic and laboratory data in the context of their chronic comorbidities is felt to place them at high risk for further clinical deterioration. Furthermore, it is not anticipated that the patient will be medically stable for discharge from the hospital within 2 midnights of admission.   * I certify that at the point of admission it is my clinical judgment that the patient will require inpatient hospital care spanning beyond 2 midnights from the point of admission due to high intensity of service, high risk for further deterioration and high frequency of surveillance required.*  Author: Verdene Lennert, MD 07/19/2023 9:29 PM  For on call review [email or web address].

## 2023-07-19 NOTE — Sepsis Progress Note (Signed)
Sepsis protocol monitored by e[employer] ?

## 2023-07-19 NOTE — ED Triage Notes (Signed)
[facility] for [facility] and AMS. Patient alert to self. BP in the 50's at the cancer center. Pt inconsolable in triage.

## 2023-07-19 NOTE — Progress Notes (Signed)
Sympt[facility] Management [facility] at [facility] [phone] [phone]  Patient Care Team: Armando Gang, FNP as PCP - General (Family Medicine) Glory Buff, RN as Oncology Nurse Navigator Carmina Miller, MD as Radiation Oncologist (Radiation Oncology) Vida Rigger, MD as Consulting Physician (Pulmonary Disease) Creig Hines, MD as Consulting Physician (Oncology)   NAME OF PATIENT: Gina Sanchez  [id number]  1965/11/02   DATE OF VISIT: 07/19/23  REASON FOR CONSULT: CERENITI CURB is a 57 y.o. 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    Patient hospitalized 07/04/2023 to 07/09/2023 with sepsis from multifocal pneumonia.  She was also started on Keppra for possible seizures.  CT of the head showed significant bilateral metastatic disease, new when compared to previous images.  Patient was started on whole brain radiation.  INTERVAL HISTORY: Patient seen several times in [facility] recently weakness and fatigue and received IV fluids.  Today, patient presents to [facility] with reported confusion.  Was found to be hypotensive.  Husband reports that PCP recently increased dose of metoprolol.  Denies fever or chills.  No cough, congestion, shortness of breath, or chest pain.  Denies nausea or vomiting.  Does have constipation.  Denies any urinary symptoms.  Denies other symptomatic complaints.  PAST MEDICAL HISTORY: Past Medical History:  Diagnosis Date   Abnormal Pap smear of cervix    Anxiety    C. 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   Chronic diarrhea    Closed fracture of distal end of left radius 03/23/2021   Colon polyp    Depression    Diverticulosis    Essential hypertension    Femur fracture, right (HCC) 09/14/2020   GERD (gastroesophageal reflux disease)    Hyperlipidemia    Leukocytosis     Lumbar radiculopathy    Lung cancer (HCC)    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   Metastatic cancer (HCC) 10/2020   lung, bone, lymph node, adrenal   Palpitations    Precordial chest pain    Wrist fracture 03/2021   left    PAST SURGICAL HISTORY:  Past Surgical History:  Procedure Laterality Date   BREAST BIOPSY Right 2017   benign   CERVICAL BIOPSY  W/ LOOP ELECTRODE EXCISION     COLONOSCOPY  06/11/2022   Procedure: COLONOSCOPY;  Surgeon: Toney Reil, MD;  Location: [facility] ENDOSCOPY;  Service: Gastroenterology;;   COLONOSCOPY WITH PROPOFOL N/A 07/03/2015   Procedure: COLONOSCOPY WITH PROPOFOL;  Surgeon: Wallace Cullens, MD;  Location: [facility] ENDOSCOPY;  Service: Gastroenterology;  Laterality: N/A;   COLONOSCOPY WITH PROPOFOL N/A 01/06/2022   Procedure: COLONOSCOPY WITH PROPOFOL;  Surgeon: Toney Reil, MD;  Location: [facility] ENDOSCOPY;  Service: Gastroenterology;  Laterality: N/A;   ESOPHAGOGASTRODUODENOSCOPY (EGD) WITH PROPOFOL N/A 07/03/2015   Procedure: ESOPHAGOGASTRODUODENOSCOPY (EGD) WITH PROPOFOL;  Surgeon: Wallace Cullens, MD;  Location: [facility] ENDOSCOPY;  Service: Gastroenterology;  Laterality: N/A;   FEMUR IM NAIL Left 06/30/2023   Procedure: Left prophylactic intramedullary nailing of impending left subtrochanteric femur fracture;  Surgeon: Christena Flake, MD;  Location: [facility] ORS;  Service: Orthopedics;  Laterality: Left;   FLEXIBLE SIGMOIDOSCOPY N/A 03/12/2022   Procedure: FLEXIBLE SIGMOIDOSCOPY;  Surgeon: Toney Reil, MD;  Location: [facility] ENDOSCOPY;  Service: Gastroenterology;  Laterality: N/A;   FLEXIBLE SIGMOIDOSCOPY N/A 08/17/2022   Procedure: FLEXIBLE SIGMOIDOSCOPY;  Surgeon: Toney Reil, MD;  Location:  [facility] ENDOSCOPY;  Service: Gastroenterology;  Laterality: N/A;   INTRAMEDULLARY (IM) NAIL INTERTROCHANTERIC Right 09/15/2020   Procedure: INTRAMEDULLARY (IM) NAIL INTERTROCHANTRIC;  Surgeon: Christena Flake, MD;  Location: [facility] ORS;  Service: Orthopedics;   Laterality: Right;   IR CV LINE INJECTION  07/14/2022   IR IMAGING GUIDED PORT INSERTION  11/28/2020   LEFT HEART CATH AND CORONARY ANGIOGRAPHY N/A 03/28/2017   Procedure: Left Heart Cath and Coronary Angiography;  Surgeon: Runell Gess, MD;  Location[facility] INVASIVE CV LAB;  Service: Cardiovascular;  Laterality: N/A;   ORIF WRIST FRACTURE Left 03/31/2021   Procedure: OPEN REDUCTION INTERNAL FIXATION (ORIF) LEFT DISTAL RADIUS FRACTURE.;  Surgeon: Christena Flake, MD;  Location: [facility] ORS;  Service: Orthopedics;  Laterality: Left;    HEMATOLOGY/ONCOLOGY HISTORY:  Oncology History  Metastatic lung cancer (metastasis from lung to other site) [facility])  10/19/2020 Initial Diagnosis   Metastatic lung cancer (metastasis from lung to other site) [facility])   10/19/2020 Cancer Staging   Staging form: Lung, AJCC 8th Edition - Clinical: Stage IV (cT1, cN3, pM1) - Signed by Creig Hines, MD on 10/19/2020   10/27/2020 - 02/12/2022 Chemotherapy   Patient is on Treatment Plan : LUNG NSCLC Pemetrexed (Alimta) / Carboplatin q21d x 1 cycles     06/08/2022 - 09/15/2022 Chemotherapy   Patient is on Treatment Plan : LUNG NSCLC Pemetrexed + Carboplatin q21d x 4 Cycles     02/17/2023 -  Chemotherapy   Patient is on Treatment Plan : LUNG NSCLC Carboplatin + Paclitaxel + Bevacizumab q21d       ALLERGIES:  is allergic to factive [gemifloxacin].  MEDICATIONS:  No current facility-administered medications for this visit.   Current Outpatient Medications  Medication Sig Dispense Refill   acetaminophen (TYLENOL) 500 MG tablet Take 500 mg by mouth every 6 (six) hours as needed.     ALPRAZolam (XANAX) 0.5 MG tablet TAKE 1 TABLET BY MOUTH DAILY AS NEEDED FOR ANXIETY. 30 tablet 0   citalopram (CELEXA) 10 MG tablet Take 1 tablet (10 mg total) by mouth daily. 30 tablet 3   dexamethasone (DECADRON) 4 MG tablet Take 1 tablet (4 mg total) by mouth 2 (two) times daily. 60 tablet 0   fentaNYL (DURAGESIC) 25 MCG/HR Place 1 patch onto the  skin every 3 (three) days. 10 patch 0   HYDROcodone bit-homatropine (HYCODAN) 5-1.5 MG/5ML syrup Take 5 mLs by mouth every 6 (six) hours as needed for cough. (Patient not taking: Reported on 07/18/2023) 240 mL 0   hydrocortisone (CORTEF) 10 MG tablet Take 1 tablet (10 mg total) by mouth at bedtime. 30 tablet 3   hydrocortisone (CORTEF) 20 MG tablet Take 1 tablet (20 mg total) by mouth every morning. 30 tablet 3   HYDROmorphone (DILAUDID) 2 MG tablet Take 1-2 tablets (2-4 mg total) by mouth every 4 (four) hours as needed for severe pain (pain score 7-10) or moderate pain (pain score 4-6). 20 tablet 0   HYDROmorphone (DILAUDID) 2 MG tablet Take 1-2 tablets (2-4 mg total) by mouth every 4 (four) hours as needed for severe pain (pain score 7-10). 120 tablet 0   levETIRAcetam (KEPPRA) 500 MG tablet Take 1 tablet (500 mg total) by mouth 2 (two) times daily. 60 tablet 2   lidocaine (LIDODERM) 5 % Place 1 patch onto the skin daily. Remove & Discard patch within 12 hours or as directed by MD 30 patch 1   metoprolol succinate (TOPROL-XL) 25 MG 24 hr tablet Take 1 tablet (25  mg total) by mouth daily. 90 tablet 0   ondansetron (ZOFRAN) 4 MG tablet TAKE 1 TABLET BY MOUTH EVERY 8 HOURS AS NEEDED FOR NAUSEA AND VOMITING (Patient taking differently: Take 4 mg by mouth every 8 (eight) hours as needed for nausea or vomiting. TAKE 1 TABLET BY MOUTH EVERY 8 HOURS AS NEEDED FOR NAUSEA AND VOMITING) 20 tablet 1   polyethylene glycol powder (GLYCOLAX/MIRALAX) 17 GM/SCOOP powder Take 1 Container by mouth once.     prochlorperazine (COMPAZINE) 10 MG tablet Take 1 tablet (10 mg total) by mouth every 6 (six) hours as needed for nausea or vomiting. 30 tablet 1   Vedolizumab (ENTYVIO) 108 MG/0.68ML SOPN Inject 1 Pen into the skin every 14 (fourteen) days. (Patient taking differently: Inject 1 Pen into the skin every 30 (thirty) days.) 1.36 mL 12   Facility-Administered Medications Ordered in Other Visits  Medication Dose Route  Frequency Provider Last Rate Last Admin   sodium chloride flush (NS) 0.9 % injection 10 mL  10 mL Intravenous PRN Creig Hines, MD   10 mL at 03/09/21 0906    VITAL SIGNS: BP (!) (P) 52/43   Pulse (P) 91   Temp (!) (P) 93 F (33.9 C) (Tympanic)   LMP 09/15/2018  There were no vitals filed for this visit.   Estimated body mass index is 28.82 kg/m as calculated from the following:   Height as of an earlier encounter on 07/19/23: 5\' 6"  (1.676 m).   Weight as of an earlier encounter on 07/19/23: 178 lb 9.2 oz (81 kg).  LABS: CBC:    Component Value Date/Time   WBC 18.6 (H) 07/19/2023 1354   WBC 15.6 (H) 07/18/2023 1125   HGB 13.2 07/19/2023 1354   HGB 13.0 05/31/2022 1156   HCT 40.3 07/19/2023 1354   HCT 40.9 05/31/2022 1156   PLT 306 07/19/2023 1354   PLT 331 05/31/2022 1156   MCV 84.8 07/19/2023 1354   MCV 87 05/31/2022 1156   NEUTROABS 16.3 (H) 07/19/2023 1354   NEUTROABS 6.5 03/14/2018 1343   LYMPHSABS 0.9 07/19/2023 1354   LYMPHSABS 4.1 (H) 03/14/2018 1343   MONOABS 1.2 (H) 07/19/2023 1354   EOSABS 0.0 07/19/2023 1354   EOSABS 0.4 03/14/2018 1343   BASOSABS 0.0 07/19/2023 1354   BASOSABS 0.0 03/14/2018 1343   Comprehensive Metabolic Panel:    Component Value Date/Time   NA 129 (L) 07/19/2023 1354   NA 141 05/31/2022 1156   K 5.0 07/19/2023 1354   CL 95 (L) 07/19/2023 1354   CO2 22 07/19/2023 1354   BUN 27 (H) 07/19/2023 1354   BUN 8 05/31/2022 1156   CREATININE 0.58 07/19/2023 1354   GLUCOSE 122 (H) 07/19/2023 1354   CALCIUM 8.9 07/19/2023 1354   AST 38 07/19/2023 1354   ALT 40 07/19/2023 1354   ALKPHOS 201 (H) 07/19/2023 1354   BILITOT 1.4 (H) 07/19/2023 1354   PROT 7.4 07/19/2023 1354   PROT 5.9 (L) 05/31/2022 1156   ALBUMIN 3.6 07/19/2023 1354   ALBUMIN 3.4 (L) 05/31/2022 1156    RADIOGRAPHIC STUDIES: EEG adult  Result Date: 07-23-2023 Jefferson Fuel, MD     07/08/2023  4:13 AM Routine EEG Report LATRELLE FUSTON is a 57 y.o. female with a  history of seizures who is undergoing an EEG to evaluate for seizures. Report: This EEG was acquired with electrodes placed according to the International 10-20 electrode system (including Fp1, Fp2, F3, F4, C3, C4, P3, P4, O1, O2, T3,  T4, T5, T6, A1, A2, Fz, Cz, Pz). The following electrodes were missing or displaced: none. The occipital dominant rhythm was 7 Hz with intermittent more pronounced diffuse slowing. This activity is reactive to stimulation. Drowsiness was manifested by background fragmentation; deeper stages of sleep were not identified. There was no focal slowing. There were no interictal epileptiform discharges. There were no electrographic seizures identified. There was no abnormal response to photic stimulation or hyperventilation. Impression and clinical correlation: This EEG was obtained while awake and drowsy and is abnormal due to mild diffuse slowing indicative of global cerebral dysfunction. Epileptiform abnormalities were not seen during this recording. Bing Neighbors, MD [facility] [phone] If 7pm- 7am, please page neurology on call as listed in AMION.   MR BRAIN W WO CONTRAST  Result Date: 07/05/2023 CLINICAL DATA:  57 year old female altered mental status. 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 Subsequent encounter. EXAM: MRI HEAD WITHOUT AND WITH CONTRAST TECHNIQUE: Multiplanar, multiecho pulse sequences of the brain and surrounding structures were obtained without and with intravenous contrast. CONTRAST:  7.6mL GADAVIST GADOBUTROL 1 MMOL/ML IV SOLN COMPARISON:  Head CT this morning.  Brain MRI 01/20/2021. FINDINGS: Brain: Motion artifact on postcontrast imaging despite repeated imaging attempts. There are numerable subcentimeter FLAIR and T2 hyperintense foci scattered throughout the bilateral brain (see series 9), some of the largest  of which correspond to the hyperdense foci by CT this morning (including adjacent to the right occipital horn series 9, image 24 measuring 9 mm). None of these are identified on SWI with susceptibility. However, some are mildly T1 hyperintense (such as in the left anterior frontal lobe series 7, image 16). But nearly all of the lesions are conspicuous on diffusion imaging, some are restricted and some have ring like morphology (series 13, image 28). None of the lesions are very apparent on motion degraded axial postcontrast imaging. But a few of them do appear to be enhancing on coronal postcontrast imaging in the posterior left hemisphere (series 21). Furthermore, when comparing pre and postcontrast sagittal T1 weighted imaging, it is suspected that many of the small lesions do abnormally enhance (see series 22). No associated vasogenic edema. And no significant intracranial mass effect. No dural thickening or enhancement. No superimposed restricted diffusion which is strongly suggestive of acute infarction. No ventriculomegaly, extra-axial collection. Cervicomedullary junction and pituitary are within normal limits. Vascular: Major intracranial vascular flow voids are stable. Following contrast the major dural venous sinuses are enhancing and appear to be patent. Skull and upper cervical spine: Thick left superior frontal bone metastasis was better demonstrated by CT this morning, decreased T2 signal there is new since the 2022 MRI. No destructive osseous lesion is identified. Sinuses/Orbits: Stable, negative. Other: Mastoids remain well aerated. Negative visible scalp and face. IMPRESSION: 1. Innumerable subcentimeter brain lesions which are new since 2022. Although most are difficult to identify on motion degraded post-contrast images, the constellation of MRI findings is more suggestive of brain metastases than embolic infarcts or other differential considerations. And it is possible that most of the lesions  are treated metastases, with no associated vasogenic edema or mass effect. A short interval repeat MRI in 2 months may be valuable. 2. Progressed skull metastases since 2022 but no destructive osseous lesion is identified. Electronically Signed   By: Odessa Fleming M.D.   On: 07/05/2023 13:24   CT HEAD WO CONTRAST ( )  Result Date:  07/05/2023 CLINICAL DATA:  57 year old female altered mental status. Possible trace hemorrhage layering in the right occipital horn on head CT 0055 hours today., and small left anterior frontal lobe hyperdensity also. Metastatic lung cancer. EXAM: CT HEAD WITHOUT CONTRAST TECHNIQUE: Contiguous axial images were obtained from the base of the skull through the vertex without intravenous contrast. RADIATION DOSE REDUCTION: This exam was performed according to the departmental dose-optimization program which includes automated exposure control, adjustment of the mA and/or kV according to patient size and/or use of iterative reconstruction technique. COMPARISON:  Head CT 0055 hours today.  Brain MRI 01/20/2021. FINDINGS: Brain: Unchanged small 5 mm hyperdense foci in both the left anterior frontal and right occipital lobes both visible on series 2, image 13 now. No associated edema or mass effect by CT. The right occipital lesion now is seen to be separate from the occipital horn which is on series 2, image 15. These appear to be parenchymal. Furthermore, there is a subtle 3rd hyperdense lesion in the right anterior inferior temporal lobe sagittal image 21. And possibly additional punctate lesions such as the left frontal lobe on series 2, image 17. All of these are new since 09/16/2022. No midline shift or intracranial mass effect. No ventriculomegaly. No extra-axial blood identified. No cortically based acute infarct identified. Normal basilar cisterns. Vascular: No suspicious intracranial vascular hyperdensity. Skull: Abnormal left frontal bone appears infiltrated with abnormal sclerosis  and periosteal reaction on series 3, image 51, new since 09/16/2022. This is compatible with skull metastasis. No superimposed skull fracture is identified. No lytic bone lesion is identified. Sinuses/Orbits: Visualized paranasal sinuses and mastoids are stable and well aerated. Other: Visualized orbits and scalp soft tissues are within normal limits. IMPRESSION: 1. 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 2. At least 3 subcentimeter hyperdense foci in the bilateral brain are more likely small hyperdense Brain Metastases than areas of intracranial blood. Brain MRI without and with contrast should be confirmatory and could be compared to 01/20/2021. 3. No associated intracranial mass effect.  No cerebral edema by CT. Electronically Signed   By: Odessa Fleming M.D.   On: 07/05/2023 07:54   CT CHEST ABDOMEN PELVIS W CONTRAST  Result Date: 07/05/2023 CLINICAL DATA:  Sepsis.  History of metastatic lung cancer. EXAM: CT CHEST, ABDOMEN, AND PELVIS WITH CONTRAST TECHNIQUE: Multidetector CT imaging of the chest, abdomen and pelvis was performed following the standard protocol during bolus administration of intravenous contrast. RADIATION DOSE REDUCTION: This exam was performed according to the departmental dose-optimization program which includes automated exposure control, adjustment of the mA and/or kV according to patient size and/or use of iterative reconstruction technique. CONTRAST:  OMNIPAQUE IOHEXOL 300 MG/ML  SOLN COMPARISON:  CT chest abdomen pelvis 04/19/2023 FINDINGS: CT CHEST FINDINGS Cardiovascular: Right chest wall accessed Port-A-Cath with tip at the superior cavoatrial junction. Normal heart size. No significant pericardial effusion. The thoracic aorta is normal in caliber. No atherosclerotic plaque of the thoracic aorta. No coronary artery calcifications. Mediastinum/Nodes: Stable 1 cm subcarinal lymph node. No enlarged mediastinal, hilar, or axillary lymph nodes. Trachea  and esophagus demonstrate no significant findings. Tiny hiatal hernia. Subcentimeter hypodensity of the right thyroid gland-no further follow-up indicated. Lungs/Pleura: Right upper lobe and lower lobe patchy airspace opacities. Stable left lower lobe 8 x 7 mm subpleural nodule (4:50). No pulmonary mass. No pleural effusion. No pneumothorax. Musculoskeletal: No chest wall abnormality. Chronic diffuse axial and appendicular sclerotic osseous lesions. No acute displaced fracture. Multilevel degenerative changes  of the spine. CT ABDOMEN PELVIS FINDINGS Hepatobiliary: Vague hypodensity along the falciform ligament likely focal fatty infiltration. No gallstones, gallbladder wall thickening, or pericholecystic fluid. No biliary dilatation. Pancreas: No focal lesion. Normal pancreatic contour. No surrounding inflammatory changes. No main pancreatic ductal dilatation. Spleen: Normal in size. Slightly more conspicuous splenic hypodensities measuring of 2 cm (2:53). Adrenals/Urinary Tract: Stable left adrenal gland nodules measuring of to 2.4 x 1.2 cm. Stable right adrenal gland nodules measuring up to 4.6 x 1.6 cm. Bilateral kidneys enhance symmetrically. No hydronephrosis. No hydroureter. The urinary bladder is unremarkable. Stomach/Bowel: Stomach is within normal limits. No evidence of bowel wall thickening or dilatation. Appendix appears normal. Vascular/Lymphatic: No abdominal aorta or iliac aneurysm. Mild atherosclerotic plaque of the aorta and its branches. No abdominal, pelvic, or inguinal lymphadenopathy. Reproductive: Uterus and bilateral adnexa are unremarkable. Other: No intraperitoneal free fluid. No intraperitoneal free gas. No organized fluid collection. Musculoskeletal: No abdominal wall hernia or abnormality. Chronic diffuse axial and appendicular sclerotic osseous lesions. No acute displaced fracture. Six lumbar vertebral bodies with sacralization of the L6 level. Grade 1 anterolisthesis L4 on L5. Mild  retrolisthesis of L1 on L2. Bilateral intramedullary nail fixation of the femurs. Persistent periprosthetic lucency along the right femoral shaft surgical hardware. IMPRESSION: 1. Multifocal right lung pneumonia versus aspiration pneumonia. Underlying malignancy not fully excluded. 2. Stable left lower lobe 8 x 7 mm subpleural nodule. 3. Stable indeterminate bilateral adrenal gland nodules. 4. Stable indeterminate splenic hypodensities. 5. Chronic diffuse axial and appendicular sclerotic osseous metastases. Electronically Signed   By: Tish Frederickson M.D.   On: 07/05/2023 02:23   CT HEAD WO CONTRAST ( )  Result Date: 07/05/2023 CLINICAL DATA:  Mental status change, unknown cause Pt discharged yesterday after rod placement in L leg - pt altered since d/c yesterday. EXAM: CT HEAD WITHOUT CONTRAST TECHNIQUE: Contiguous axial images were obtained from the base of the skull through the vertex without intravenous contrast. RADIATION DOSE REDUCTION: This exam was performed according to the departmental dose-optimization program which includes automated exposure control, adjustment of the mA and/or kV according to patient size and/or use of iterative reconstruction technique. COMPARISON:  CT head 09/16/2022 FINDINGS: Brain: No evidence of large-territorial acute infarction. No definite parenchymal hemorrhage. No mass lesion. No definite extra-axial collection. A 3 mm hyperdensity in the region of the posterior horn of the right lateral ventricle. Question vague 4 mm hyperdensity versus artifact within the left frontal lobe (3:14). No mass effect or midline shift. No hydrocephalus. Basilar cisterns are patent. Vascular: No hyperdense vessel. Skull: No acute fracture or focal lesion. Sinuses/Orbits: Paranasal sinuses and mastoid air cells are clear. The orbits are unremarkable. Other: None. IMPRESSION: 1. A 3 mm hyperdensity in the region of the posterior horn of the right lateral ventricle. Indeterminate etiology with  hemorrhage not excluded. Recommend follow-up CT in 4-6 hours to evaluate for stability. 2. Question vague 4 mm hyperdensity versus artifact within the left frontal lobe. Recommended attention on follow-up Electronically Signed   By: Tish Frederickson M.D.   On: 07/05/2023 02:01   DG Chest 1 View  Result Date: 07/02/2023 CLINICAL DATA:  Tachycardia EXAM: CHEST  1 VIEW COMPARISON:  None Available. FINDINGS: Abnormal mediastinal silhouette, compatible with known adenopathy. New somewhat nodular opacity in the right midlung. No visible pleural effusions or pneumothorax. Polyarticular degenerative change. IMPRESSION: 1. New somewhat nodular opacity in the right midlung which could represent progressive metastatic disease, treatment change, and/or infection. CT of the chest with contrast could further  evaluate if clinically warranted 2. Abnormal mediastinal silhouette, compatible with known lymphadenopathy. Electronically Signed   By: Feliberto Harts M.D.   On: 07/02/2023 13:02   DG HIP UNILAT WITH PELVIS 2-3 VIEWS LEFT  Result Date: 06/30/2023 CLINICAL DATA:  Left hip surgery.  Intraoperative fluoroscopy. EXAM: DG HIP (WITH OR WITHOUT PELVIS) 2-3V LEFT COMPARISON:  Left femur radiographs 04/20/2023 FINDINGS: Images were performed intraoperatively without the presence of a radiologist. Interval left femoral long intramedullary nail fixation, spanning the multiple previously seen sclerotic left femoral bone diaphyses. No hardware complication is seen. Total fluoroscopy images: 4 Total fluoroscopy time: 120 seconds Total dose: Radiation Exposure Index (as provided by the fluoroscopic device): 20.73 mGy air Kerma Please see intraoperative findings for further detail. IMPRESSION: Intraoperative fluoroscopy for left femoral long intramedullary nail fixation. Electronically Signed   By: Neita Garnet M.D.   On: 06/30/2023 16:44   DG C-Arm 1-60 Min-No Report  Result Date: 06/30/2023 Fluoroscopy was utilized by the  requesting physician.  No radiographic interpretation.   DG C-Arm 1-60 Min-No Report  Result Date: 06/30/2023 Fluoroscopy was utilized by the requesting physician.  No radiographic interpretation.    PERFORMANCE STATUS (ECOG) : 1 - Symptomatic but completely ambulatory  Review of Systems Unless otherwise noted, a complete review of systems is negative.  Physical Exam General: NAD Cardiac: tachy, regular rhythm Pulmonary: clear anterior/posterior fields Abdominal: Soft, nontender to palp Extremities: no edema, no joint deformities Skin: no rashes Neurological: Weakness but otherwise nonfocal  IMPRESSION/PLAN: Stage IV non-small cell lung cancer -maintenance Avastin on hold due to recent left hip surgery and whole brain radiation  Hypotension/hypothermia-patient alert but confused.  Oriented only to person.  Systolic blood pressure in the 50s on recheck.  Discussed with Dr. Smith Robert and will send patient to the ED for further evaluation management.   Patient expressed understanding and was in agreement with this plan. She also understands that She can call clinic at any time with any questions, concerns, or complaints.   Thank you for allowing me to participate in the care of this very pleasant patient.   Time Total: 20 minutes  Visit consisted of counseling and education dealing with the complex and emotionally intense issues of symptom management in the setting of serious illness.Greater than 50%  of this time was spent counseling and coordinating care related to the above assessment and plan.  Signed by: Laurette Schimke, PhD, NP-C

## 2023-07-19 NOTE — Assessment & Plan Note (Signed)
Patient made DNR with intubation intervention if needed on admission

## 2023-07-19 NOTE — Telephone Encounter (Signed)
Josh, NP spoke with pt's husband. Pt experiencing AMS. She was instructed by [facility] APP to come to clinic now for further work up and evaluation.

## 2023-07-19 NOTE — Assessment & Plan Note (Signed)
Patient's family notes that her mental status has gradually worsened over the last 3 weeks and declined when she started whole brain radiation.  Over the last couple days though it has been relatively stable with fluctuating disorientation throughout the day and fatigue.  - Bedside swallow study prior to initiating p.o. intake - Will consider MRI of the brain if any changes are noted - Will need EEG if seizure is noted or suspected - Continue home Keppra for seizure prophylaxis - Continue home Decadron

## 2023-07-19 NOTE — Assessment & Plan Note (Addendum)
Resolved.

## 2023-07-19 NOTE — ED Provider Notes (Signed)
[facility] Provider Note    Event Date/Time   First MD Initiated Contact with Patient 07/19/23 1510     (approximate)   History   Altered Mental Status   HPI  Gina Sanchez is a 57 y.o. female with metastatic lung cancer on chemotherapy who comes in with concerns for altered mental status.  I reviewed the note from 07/04/2023 patient was also admitted for altered mental status noted to be febrile.  At that time she was found to have multifocal pneumonia, new metastatic lesions concern for possible new seizures and loaded and started on Keppra.  Patient was seen in oncology today with weakness and fatigue she did have her metoprolol recently increased.  Patient herself is not able to eat give any symptoms.  She is able to state what her name is and where she is located but not the year.  When I ask her questions about her prior history she is not able to answer them such as when her last radiation treatment was her last chemotherapy.  Patient does seem confused.  Cording to family member they have felt like they have noted some foul-smelling urine and that she is been more weak and difficulty getting around.  They deny any known falls.  She is not on any blood thinners.     IMPRESSION: 1. Innumerable subcentimeter brain lesions which are new since 2022. Although most are difficult to identify on motion degraded post-contrast images, the constellation of MRI findings is more suggestive of brain metastases than embolic infarcts or other differential considerations. And it is possible that most of the lesions are treated metastases, with no associated vasogenic edema or mass effect. A short interval repeat MRI in 2 months may be valuable.   2. Progressed skull metastases since 2022 but no destructive osseous lesion is identified.   Physical Exam   Triage Vital Signs: ED Triage Vitals  Encounter Vitals Group     BP 07/19/23 1437 95/65     Systolic  BP Percentile --      Diastolic BP Percentile --      Pulse Rate 07/19/23 1437 (!) 103     Resp 07/19/23 1437 18     Temp 07/19/23 1437 (!) 96.6 F (35.9 C)     Temp Source 07/19/23 1437 Rectal     SpO2 07/19/23 1437 100 %     Weight 07/19/23 1425 178 lb 9.2 oz (81 kg)     Height 07/19/23 1425 5\' 6"  (1.676 m)     Head Circumference --      Peak Flow --      Pain Score --      Pain Loc --      Pain Education --      Exclude from Growth Chart --     Most recent vital signs: Vitals:   07/19/23 1437  BP: 95/65  Pulse: (!) 103  Resp: 18  Temp: (!) 96.6 F (35.9 C)  SpO2: 100%     General: Awake, no distress.  CV:  Good peripheral perfusion.  Resp:  Normal effort.  Abd:  No distention.  Other:  Patient able to squeeze with both hands.  She is got no redness or warmth noted of the left hip.  She is able to wiggle bilateral toes.  No obvious tenderness in her abdomen however patient does appear confused.   ED Results / Procedures / Treatments   Labs (all labs ordered are listed, but only abnormal  results are displayed) Labs Reviewed  CULTURE, BLOOD (ROUTINE X 2)  CULTURE, BLOOD (ROUTINE X 2)  COMPREHENSIVE METABOLIC PANEL  LACTIC ACID, PLASMA  LACTIC ACID, PLASMA  CBC WITH DIFFERENTIAL/PLATELET  PROTIME-INR  APTT  URINALYSIS, W/ REFLEX TO CULTURE (INFECTION SUSPECTED)  CBG MONITORING, ED     EKG  My interpretation of EKG:  Sinus tachycardia 09/13/2005 without any ST elevation, T wave version in V6, normal intervals  RADIOLOGY I reviewed CT head personally interpreted and no new acute pathology.   PROCEDURES:  Critical Care performed: Yes, see critical care procedure note(s)  .1-3 Lead EKG Interpretation  Performed by: Concha Se, MD Authorized by: Concha Se, MD     Interpretation: normal     ECG rate:  90   ECG rate assessment: normal     Rhythm: sinus rhythm     Ectopy: none     Conduction: normal   .Critical Care  Performed by: Concha Se, MD Authorized by: Concha Se, MD   Critical care provider statement:    Critical care time (minutes):  30   Critical care was necessary to treat or prevent imminent or life-threatening deterioration of the following conditions:  Sepsis   Critical care was time spent personally by me on the following activities:  Development of treatment plan with patient or surrogate, discussions with consultants, evaluation of patient's response to treatment, examination of patient, ordering and review of laboratory studies, ordering and review of radiographic studies, ordering and performing treatments and interventions, pulse oximetry, re-evaluation of patient's condition and review of old charts    MEDICATIONS ORDERED IN ED: Medications  ceFEPIme (MAXIPIME) 2 g in sodium chloride 0.9 % 100 mL IVPB (has no administration in time range)  metroNIDAZOLE (FLAGYL) IVPB 500 mg (has no administration in time range)  hydrocortisone sodium succinate (SOLU-CORTEF) 100 MG injection 100 mg (has no administration in time range)  iohexol (OMNIPAQUE) 350 MG/ML injection 100 mL (has no administration in time range)  vancomycin (VANCOREADY) IVPB 1750 mg/350 mL (has no administration in time range)  sodium chloride 0.9 % bolus 1,000 mL (1,000 mLs Intravenous New Bag/Given 07/19/23 1506)     IMPRESSION / MDM / ASSESSMENT AND PLAN / ED COURSE  I reviewed the triage vital signs and the nursing notes.   Patient's presentation is most consistent with acute presentation with potential threat to life or bodily function.   Patient comes in hypotensive, hypothermic.  Sepsis alert was called patient is brought with broad-spectrum antibiotics.  Fluid resuscitation was ordered stress dose steroids ordered.  Given the altered mental status and unclear patient's symptoms.  CT scan will be ordered to evaluate for potential infectious sources as well as CT head to evaluate for intracranial hemorrhage status post radiation  therapy.  Patient will require admission to the hospitalist.  Full 30 cc/kg fluid resuscitation was ordered.  COVID, flu were negative.  Lactate slightly elevated patient getting full fluid resuscitation.  Her white count is elevated met sepsis criteria already on broad-spectrum antibiotics.  Urine without evidence of UTI.  CT head was negative.  I discussed the hospitalist admission for sepsis rule out her CT PE and CT abdomen are still pending but given the significant amount of time for these reads to be done I have discussed with them that these are still pending at time of admission.  The patient is on the cardiac monitor to evaluate for evidence of arrhythmia and/or significant heart rate changes.  FINAL CLINICAL IMPRESSION(S) / ED DIAGNOSES   Final diagnoses:  Altered mental status, unspecified altered mental status type  Sepsis, due to unspecified organism, unspecified whether acute organ dysfunction present [facility])     Rx / DC Orders   ED Discharge Orders     None        Note:  This document was prepared using Dragon voice recognition software and may include unintentional dictation errors.   Concha Se, MD 07/19/23 [phone]

## 2023-07-19 NOTE — Assessment & Plan Note (Addendum)
Improved with restarting Toprol.

## 2023-07-19 NOTE — Consult Note (Signed)
CODE SEPSIS - PHARMACY COMMUNICATION  **Broad Spectrum Antibiotics should be administered within 1 hour of Sepsis diagnosis**  Time Code Sepsis Called/Page Received: 1530  Antibiotics Ordered: vancomycin, cefepime, flagyl  Time of 1st antibiotic administration: 1620  Additional action taken by pharmacy: n/a  If necessary, Name of Provider/Nurse Contacted: n/a    Bettey Costa ,PharmD Clinical Pharmacist  07/19/2023  3:37 PM

## 2023-07-20 ENCOUNTER — Ambulatory Visit: Payer: 59

## 2023-07-20 DIAGNOSIS — Z515 Encounter for palliative care: Secondary | ICD-10-CM | POA: Diagnosis not present

## 2023-07-20 DIAGNOSIS — G9341 Metabolic encephalopathy: Secondary | ICD-10-CM

## 2023-07-20 DIAGNOSIS — I959 Hypotension, unspecified: Secondary | ICD-10-CM

## 2023-07-20 DIAGNOSIS — K5902 Outlet dysfunction constipation: Secondary | ICD-10-CM

## 2023-07-20 DIAGNOSIS — K5900 Constipation, unspecified: Secondary | ICD-10-CM

## 2023-07-20 DIAGNOSIS — D72829 Elevated white blood cell count, unspecified: Secondary | ICD-10-CM

## 2023-07-20 DIAGNOSIS — I9589 Other hypotension: Secondary | ICD-10-CM

## 2023-07-20 DIAGNOSIS — C3490 Malignant neoplasm of unspecified part of unspecified bronchus or lung: Secondary | ICD-10-CM | POA: Diagnosis not present

## 2023-07-20 LAB — COMPREHENSIVE METABOLIC PANEL
ALT: 35 U/L (ref 0–44)
AST: 28 U/L (ref 15–41)
Albumin: 3 g/dL — ABNORMAL LOW (ref 3.5–5.0)
Alkaline Phosphatase: 181 U/L — ABNORMAL HIGH (ref 38–126)
Anion gap: 11 (ref 5–15)
BUN: 18 mg/dL (ref 6–20)
CO2: 21 mmol/L — ABNORMAL LOW (ref 22–32)
Calcium: 8.3 mg/dL — ABNORMAL LOW (ref 8.9–10.3)
Chloride: 102 mmol/L (ref 98–111)
Creatinine, Ser: <0.3 mg/dL — ABNORMAL LOW (ref 0.44–1.00)
Glucose, Bld: 104 mg/dL — ABNORMAL HIGH (ref 70–99)
Potassium: 4.2 mmol/L (ref 3.5–5.1)
Sodium: 134 mmol/L — ABNORMAL LOW (ref 135–145)
Total Bilirubin: 1.3 mg/dL — ABNORMAL HIGH (ref <1.2)
Total Protein: 6.2 g/dL — ABNORMAL LOW (ref 6.5–8.1)

## 2023-07-20 LAB — CBC WITH DIFFERENTIAL/PLATELET
Abs Immature Granulocytes: 0.13 10*3/uL — ABNORMAL HIGH (ref 0.00–0.07)
Basophils Absolute: 0 10*3/uL (ref 0.0–0.1)
Basophils Relative: 0 %
Eosinophils Absolute: 0 10*3/uL (ref 0.0–0.5)
Eosinophils Relative: 0 %
HCT: 33.8 % — ABNORMAL LOW (ref 36.0–46.0)
Hemoglobin: 11.2 g/dL — ABNORMAL LOW (ref 12.0–15.0)
Immature Granulocytes: 1 %
Lymphocytes Relative: 4 %
Lymphs Abs: 0.5 10*3/uL — ABNORMAL LOW (ref 0.7–4.0)
MCH: 28.1 pg (ref 26.0–34.0)
MCHC: 33.1 g/dL (ref 30.0–36.0)
MCV: 84.9 fL (ref 80.0–100.0)
Monocytes Absolute: 0.6 10*3/uL (ref 0.1–1.0)
Monocytes Relative: 5 %
Neutro Abs: 10.6 10*3/uL — ABNORMAL HIGH (ref 1.7–7.7)
Neutrophils Relative %: 90 %
Platelets: 240 10*3/uL (ref 150–400)
RBC: 3.98 MIL/uL (ref 3.87–5.11)
RDW: 16.7 % — ABNORMAL HIGH (ref 11.5–15.5)
WBC: 11.9 10*3/uL — ABNORMAL HIGH (ref 4.0–10.5)
nRBC: 0 % (ref 0.0–0.2)

## 2023-07-20 LAB — MAGNESIUM: Magnesium: 2.4 mg/dL (ref 1.7–2.4)

## 2023-07-20 LAB — PHOSPHORUS: Phosphorus: 3.6 mg/dL (ref 2.5–4.6)

## 2023-07-20 MED ORDER — ADULT MULTIVITAMIN W/MINERALS CH
1.0000 | ORAL_TABLET | Freq: Every day | ORAL | Status: DC
  Administered 2023-07-20 – 2023-07-23 (×4): 1 via ORAL
  Filled 2023-07-20 (×4): qty 1

## 2023-07-20 MED ORDER — POLYETHYLENE GLYCOL 3350 17 G PO PACK
17.0000 g | PACK | Freq: Every day | ORAL | Status: DC
  Administered 2023-07-20 – 2023-07-22 (×2): 17 g via ORAL
  Filled 2023-07-20 (×3): qty 1

## 2023-07-20 MED ORDER — CHLORHEXIDINE GLUCONATE CLOTH 2 % EX PADS
6.0000 | MEDICATED_PAD | Freq: Every day | CUTANEOUS | Status: DC
  Administered 2023-07-20 – 2023-07-23 (×4): 6 via TOPICAL

## 2023-07-20 MED ORDER — ENSURE ENLIVE PO LIQD
237.0000 mL | Freq: Three times a day (TID) | ORAL | Status: DC
  Administered 2023-07-20 – 2023-07-23 (×9): 237 mL via ORAL

## 2023-07-20 MED ORDER — LACTULOSE 10 GM/15ML PO SOLN
30.0000 g | Freq: Three times a day (TID) | ORAL | Status: DC
  Administered 2023-07-20: 30 g via ORAL
  Filled 2023-07-20 (×4): qty 60

## 2023-07-20 NOTE — Consult Note (Signed)
Hematology/Oncology Consult note [facility] [phone] [phone]  Patient Care Team: Armando Gang, FNP as PCP - General (Family Medicine) Glory Buff, RN as Oncology Nurse Navigator Carmina Miller, MD as Radiation Oncologist (Radiation Oncology) Vida Rigger, MD as Consulting Physician (Pulmonary Disease) Creig Hines, MD as Consulting Physician (Oncology)   Name of the patient: Gina Sanchez  [id number]  [birth year]    Reason for consult: Metastatic lung adenocarcinoma admitted for acute encephalopathy   Requesting physician: Dr. Renae Gloss  Date of visit: 07/20/2023    History of presenting illness-patient is a 57 year old female with history of metastatic adenocarcinoma of the lung presently on third line therapy with maintenance a Avastin.  Her treatment was on hold after she underwent prophylactic ORIF involving the left femur by Dr. Joice Lofts about 1 month ago.  Shortly following the surgery patient was admitted to the hospital with altered mental status and underwent MRI brain which showed multiple brain metastases for which she is receiving whole brain radiation.  During this process patient was found to be getting progressively weaker in the outpatient setting requiring periodic IV fluids.  She was asked to go to the ER from the cancer center when she was found to be significantly hypotensive.  ECOG PS- 2  Pain scale- 3   Review of systems- Review of Systems  Constitutional:  Positive for malaise/fatigue. Negative for chills, fever and weight loss.  HENT:  Negative for congestion, ear discharge and nosebleeds.   Eyes:  Negative for blurred vision.  Respiratory:  Negative for cough, hemoptysis, sputum production, shortness of breath and wheezing.   Cardiovascular:  Negative for chest pain, palpitations, orthopnea and claudication.  Gastrointestinal:  Negative for abdominal pain, blood in stool, constipation, diarrhea,  heartburn, melena, nausea and vomiting.  Genitourinary:  Negative for dysuria, flank pain, frequency, hematuria and urgency.  Musculoskeletal:  Negative for back pain, joint pain and myalgias.  Skin:  Negative for rash.  Neurological:  Negative for dizziness, tingling, focal weakness, seizures, weakness and headaches.  Endo/Heme/Allergies:  Does not bruise/bleed easily.  Psychiatric/Behavioral:  Negative for depression and suicidal ideas. The patient does not have insomnia.     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   Palliative care encounter 07/08/2023   Multifocal pneumonia 07/08/2023   Sepsis due to pneumonia (HCC) 07/05/2023   Sepsis (HCC) 07/05/2023   Pathologic fracture of part of femur (HCC) 06/30/2023   Cancer related pain 03/10/2023   Poor appetite 03/10/2023   Sinus tachycardia 02/10/2023   Secondary adenocarcinoma of lung (HCC)    Indeterminate colitis    Closed fracture of distal end of left radius 03/23/2021   Metastatic lung cancer (metastasis from lung to other site) (HCC) 10/19/2020   Goals of care, counseling/discussion 10/19/2020   Pathologic fracture    Mediastinal adenopathy    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   Metastatic cancer (HCC) 10/2020   lung, bone, lymph node, adrenal   Palpitations    Precordial chest pain    Wrist fracture 03/2021   left     Past Surgical History:  Procedure Laterality Date   BREAST BIOPSY Right 2017   benign   CERVICAL BIOPSY  W/ LOOP ELECTRODE EXCISION     COLONOSCOPY  06/11/2022   Procedure: COLONOSCOPY;  Surgeon: Toney Reil, MD;  Location: [facility] ENDOSCOPY;  Service: Gastroenterology;;   COLONOSCOPY WITH PROPOFOL N/A 07/03/2015   Procedure: COLONOSCOPY WITH PROPOFOL;  Surgeon: Wallace Cullens, MD;  Location: [facility] ENDOSCOPY;  Service: Gastroenterology;  Laterality: N/A;   COLONOSCOPY WITH PROPOFOL N/A 01/06/2022   Procedure: COLONOSCOPY WITH PROPOFOL;  Surgeon: Toney Reil, MD;  Location: [facility] ENDOSCOPY;  Service: Gastroenterology;  Laterality: N/A;   ESOPHAGOGASTRODUODENOSCOPY (EGD) WITH PROPOFOL N/A 07/03/2015   Procedure: ESOPHAGOGASTRODUODENOSCOPY (EGD) WITH PROPOFOL;  Surgeon: Wallace Cullens, MD;  Location: [facility] ENDOSCOPY;  Service: Gastroenterology;  Laterality: N/A;   FEMUR IM NAIL Left 06/30/2023   Procedure: Left prophylactic intramedullary nailing of impending left subtrochanteric femur fracture;  Surgeon: Christena Flake, MD;  Location: [facility] ORS;  Service: Orthopedics;  Laterality: Left;   FLEXIBLE SIGMOIDOSCOPY N/A 03/12/2022   Procedure: FLEXIBLE SIGMOIDOSCOPY;  Surgeon: Toney Reil, MD;  Location: [facility] ENDOSCOPY;   Service: Gastroenterology;  Laterality: N/A;   FLEXIBLE SIGMOIDOSCOPY N/A 08/17/2022   Procedure: FLEXIBLE SIGMOIDOSCOPY;  Surgeon: Toney Reil, MD;  Location: [facility] ENDOSCOPY;  Service: Gastroenterology;  Laterality: N/A;   INTRAMEDULLARY (IM) NAIL INTERTROCHANTERIC Right 09/15/2020   Procedure: INTRAMEDULLARY (IM) NAIL INTERTROCHANTRIC;  Surgeon: Christena Flake, MD;  Location: [facility] ORS;  Service: Orthopedics;  Laterality: Right;   IR CV LINE INJECTION  07/14/2022   IR IMAGING GUIDED PORT INSERTION  11/28/2020   LEFT HEART CATH AND CORONARY ANGIOGRAPHY N/A 03/28/2017   Procedure: Left Heart Cath and Coronary Angiography;  Surgeon: Runell Gess, MD;  Location: [facility] INVASIVE CV LAB;  Service: Cardiovascular;  Laterality: N/A;   ORIF WRIST FRACTURE Left 03/31/2021   Procedure: OPEN REDUCTION INTERNAL FIXATION (ORIF) LEFT DISTAL RADIUS FRACTURE.;  Surgeon: Christena Flake, MD;  Location: [facility] ORS;  Service: Orthopedics;  Laterality: Left;    Social History   Socioeconomic History   Marital status: Married    Spouse name: Gene   Number of children: 2   Years of education: Not on file   Highest education level: Not on file  Occupational History   Not on file  Tobacco Use   Smoking status: Former    Current packs/day: 0.00    Average packs/day: 1 pack/day for 30.0 years (30.0 ttl pk-yrs)    Types: Cigarettes    Start date: 09/17/1990    Quit date:  09/17/2020    Years since quitting: 2.8   Smokeless tobacco: Never  Vaping Use   Vaping status: Never Used  Substance and Sexual Activity   Alcohol use: Not Currently    Alcohol/week: 1.0 standard drink of alcohol    Types: 1 Standard drinks or equivalent per week    Comment: beer occ.   Drug use: No   Sexual activity: Yes    Birth control/protection: Post-menopausal  Other Topics Concern   Not on file  Social History Narrative   Lives in [place] with her husband.  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  Does not  routinely exercise.   The labs and vital signs and weight was done in infusion   Social Determinants of Health   Financial Resource Strain: Not on file  Food Insecurity: No Food Insecurity (07/19/2023)   Hunger Vital Sign    Worried About Running Out of Food in the Last Year: Never true    Ran Out of Food in the Last Year: Never true  Transportation Needs: No Transportation Needs (07/19/2023)   PRAPARE - Administrator, Civil Service (Medical): No    Lack of Transportation (Non-Medical): No  Physical Activity: Not on file  Stress: Not on file  Social Connections: Not on file  Intimate Partner Violence: Not At Risk (07/19/2023)   Humiliation, Afraid, Rape, and Kick questionnaire    Fear of Current or Ex-Partner: No    Emotionally Abused: No    Physically Abused: No    Sexually Abused: No     Family History  Problem Relation Age of Onset   Diabetes Mother        alive @ 35   Goiter Mother    Heart disease Father        died of MI @ 67   Osteoporosis Maternal Grandmother    Colon cancer Maternal Grandfather    Breast cancer Neg Hx    Ovarian cancer Neg Hx      Current Facility-Administered Medications:    acetaminophen (TYLENOL) tablet 650 mg, 650 mg, Oral, Q6H PRN, 650 mg at 07/20/23 1336 **OR** acetaminophen (TYLENOL) suppository 650 mg, 650 mg, Rectal, Q6H PRN, Verdene Lennert, MD   ALPRAZolam Prudy Feeler) tablet 0.5 mg, 0.5 mg, Oral, Daily PRN, Verdene Lennert, MD   Chlorhexidine Gluconate Cloth 2 % PADS 6 each, 6 each, Topical, Daily, Renae Gloss, Richard, MD, 6 each at 07/20/23 1000   citalopram (CELEXA) tablet 10 mg, 10 mg, Oral, Daily, Verdene Lennert, MD, 10 mg at 07/20/23 1400   dexamethasone (DECADRON) tablet 4 mg, 4 mg, Oral, BID, Verdene Lennert, MD, 4 mg at 07/20/23 0920   enoxaparin (LOVENOX) injection 40 mg, 40 mg, Subcutaneous, Q24H, Verdene Lennert, MD, 40 mg at 07/19/23 2303   feeding supplement (ENSURE ENLIVE / ENSURE PLUS) liquid 237 mL, 237 mL, Oral,  TID BM, Wieting, Richard, MD, 237 mL at 07/20/23 1409   fentaNYL (DURAGESIC) 25 MCG/HR 1 patch, 1 patch, Transdermal, Q72H, Verdene Lennert, MD, 1 patch at 07/20/23 1400   hydrocortisone (CORTEF) tablet 10 mg, 10 mg, Oral, QHS, Basaraba, Iulia, MD   hydrocortisone (CORTEF) tablet 20 mg, 20 mg, Oral, q morning, Verdene Lennert, MD, 20 mg at 07/20/23 0920   HYDROmorphone (DILAUDID) tablet 2-4 mg, 2-4 mg, Oral, Q4H PRN, Verdene Lennert, MD, 4 mg at 07/20/23 1400   lactulose (CHRONULAC) 10 GM/15ML solution 30 g, 30 g, Oral, TID, Renae Gloss, Richard, MD, 30 g at 07/20/23 1400   levETIRAcetam (KEPPRA)  tablet 500 mg, 500 mg, Oral, BID, Verdene Lennert, MD, 500 mg at 07/20/23 0920   magic mouthwash w/lidocaine, 5 mL, Oral, QID, Verdene Lennert, MD, 5 mL at 07/20/23 1408   multivitamin with minerals tablet 1 tablet, 1 tablet, Oral, Daily, Renae Gloss, Richard, MD, 1 tablet at 07/20/23 1400   ondansetron (ZOFRAN) tablet 4 mg, 4 mg, Oral, Q6H PRN **OR** ondansetron (ZOFRAN) injection 4 mg, 4 mg, Intravenous, Q6H PRN, Verdene Lennert, MD   polyethylene glycol (MIRALAX / GLYCOLAX) packet 17 g, 17 g, Oral, Daily, Wieting, Richard, MD, 17 g at 07/20/23 0919   sodium chloride flush (NS) 0.9 % injection 3 mL, 3 mL, Intravenous, Q12H, Verdene Lennert, MD, 3 mL at 07/20/23 2952  Facility-Administered Medications Ordered in Other Encounters:    sodium chloride flush (NS) 0.9 % injection 10 mL, 10 mL, Intravenous, PRN, Creig Hines, MD, 10 mL at 03/09/21 0906   Physical exam:  Vitals:   07/19/23 2316 07/20/23 0359 07/20/23 0849 07/20/23 1251  BP: 113/89 119/88 (!) 129/97 (!) 123/92  Pulse: 86 91 91 93  Resp: 18 18 18 18   Temp: 98.5 F (36.9 C) 97.7 F (36.5 C) 97.8 F (36.6 C) 97.9 F (36.6 C)  TempSrc:  Oral Oral   SpO2: 100% 100% 100% 100%  Weight:      Height:       Physical Exam Cardiovascular:     Rate and Rhythm: Normal rate and regular rhythm.     Heart sounds: Normal heart sounds.  Pulmonary:      Effort: Pulmonary effort is normal.     Breath sounds: Normal breath sounds.  Abdominal:     General: Bowel sounds are normal.     Palpations: Abdomen is soft.  Skin:    General: Skin is warm and dry.  Neurological:     Mental Status: She is alert and oriented to person, place, and time.     Comments: She recognized me but tends to drift away in between conversation           Latest Ref Rng & Units 07/20/2023    5:40 AM  CMP  Glucose 70 - 99 mg/dL 841   BUN 6 - 20 mg/dL 18   Creatinine 3.24 - 1.00 mg/dL <4.01   Sodium 027 - 253 mmol/L 134   Potassium 3.5 - 5.1 mmol/L 4.2   Chloride 98 - 111 mmol/L 102   CO2 22 - 32 mmol/L 21   Calcium 8.9 - 10.3 mg/dL 8.3   Total Protein 6.5 - 8.1 g/dL 6.2   Total Bilirubin <6.6 mg/dL 1.3   Alkaline Phos 38 - 126 U/L 181   AST 15 - 41 U/L 28   ALT 0 - 44 U/L 35       Latest Ref Rng & Units 07/20/2023    5:40 AM  CBC  WBC 4.0 - 10.5 K/uL 11.9   Hemoglobin 12.0 - 15.0 g/dL 44.0   Hematocrit 34.7 - 46.0 % 33.8   Platelets 150 - 400 K/uL 240     @IMAGES @  CT ABDOMEN PELVIS W CONTRAST  Result Date: 07/19/2023 CLINICAL DATA:  Pulmonary embolism, hypertension, altered mental status EXAM: CT ANGIOGRAPHY CHEST CT ABDOMEN AND PELVIS WITH CONTRAST TECHNIQUE: Multidetector CT imaging of the chest was performed using the standard protocol during bolus administration of intravenous contrast. Multiplanar CT image reconstructions and MIPs were obtained to evaluate the vascular anatomy. Multidetector CT imaging of the abdomen and pelvis was performed using the standard  protocol during bolus administration of intravenous contrast. RADIATION DOSE REDUCTION: This exam was performed according to the departmental dose-optimization program which includes automated exposure control, adjustment of the mA and/or kV according to patient size and/or use of iterative reconstruction technique. CONTRAST:  OMNIPAQUE IOHEXOL 350 MG/ML SOLN COMPARISON:  07/05/2023  FINDINGS: CTA CHEST FINDINGS Cardiovascular: Minimal coronary artery calcification. Global cardiac size within normal limits. No pericardial effusion. Central pulmonary arteries are adequately opacified and there is no intraluminal filling defect identified through the segmental level. Central pulmonary arteries are of normal caliber. The thoracic aorta is unremarkable. Right internal jugular chest port tip is seen within the right atrium. Mediastinum/Nodes: There is infiltrative soft tissue within the right paratracheal space, best seen on axial image # 45/4, which is stable since prior examination and is associated with narrowing of the superior vena cava in this region. This reflects the sequela of treated disease, better seen on PET CT examination of 10/02/2020. Borderline left pre-vascular lymph node is stable, nonspecific. No new pathologic thoracic adenopathy. Esophagus is unremarkable. Visualized thyroid is unremarkable. Lungs/Pleura: Previously noted focal consolidation within the a posterior right upper lobe and scattered ground-glass infiltrate within the right apex has improved in the interval in keeping with interval resolving infectious or inflammatory process. Residual scarring within the right upper lobe and right lower lobe noted. Mild emphysema. Stable 7 mm pulmonary nodule within the subpleural left upper lobe, axial image # 50/6, indeterminate. While stable since immediate prior examination, this first appeared on prior examination of 04/19/2023 and is indeterminate. No new focal pulmonary nodules or infiltrates. No pneumothorax or pleural effusion. Central airways are widely patent. Musculoskeletal: Widespread sclerotic metastases are again seen throughout the visualized axial skeleton, grossly stable since prior examination. No pathologic fracture. Review of the MIP images confirms the above findings. CT ABDOMEN and PELVIS FINDINGS Hepatobiliary: No focal liver abnormality is seen. No  gallstones, gallbladder wall thickening, or biliary dilatation. Pancreas: Unremarkable Spleen: 19 and 16 mm hypoenhancing subcapsular masses within the spleen are stable since prior examination, compatible with splenic metastases. No new intrasplenic lesions. The spleen is of normal size. Adrenals/Urinary Tract: Bilateral adrenal metastases are grossly stable measuring 2.2 x 5.3 cm on the right and 1.3 x 2.5 cm on the left. The kidneys are unremarkable save for a simple cortical cyst within the lower pole the left kidney for which no specific follow-up imaging is recommended. The bladder is unremarkable. Stomach/Bowel: Stomach is within normal limits. Appendix appears normal. No evidence of bowel wall thickening, distention, or inflammatory changes. Vascular/Lymphatic: Aortic atherosclerosis. No enlarged abdominal or pelvic lymph nodes. Reproductive: Uterus and bilateral adnexa are unremarkable. Other: No abdominal wall hernia or abnormality. No abdominopelvic ascites. Musculoskeletal: Bilateral hip ORIF has been performed. Widespread sclerotic metastases are again seen throughout the visualized axial skeleton, grossly stable since prior examination. No pathologic fracture identified. Review of the MIP images confirms the above findings. IMPRESSION: 1. No pulmonary embolism. No acute intrathoracic pathology identified. 2. Minimal coronary artery calcification. 3. Stable infiltrative soft tissue within the right paratracheal space, associated with narrowing of the superior vena cava in this region. This reflects the sequela of treated disease, better seen on PET CT examination of 10/02/2020. 4. Stable 7 mm pulmonary nodule within the subpleural left upper lobe, indeterminate. While stable since immediate prior examination, this first appeared on prior examination of 04/19/2023 and a a isolated pulmonary metastasis is not excluded. Close observation on subsequent surveillance examination is warranted. 5. Widespread  sclerotic metastases  throughout the visualized axial skeleton, grossly stable since prior examination. No pathologic fracture identified. 6. Stable bilateral adrenal metastases. 7. Stable splenic metastases. Aortic Atherosclerosis (ICD10-I70.0) and Emphysema (ICD10-J43.9). Electronically Signed   By: Helyn Numbers M.D.   On: 07/19/2023 18:54   CT Angio Chest PE W and/or Wo Contrast  Result Date: 07/19/2023 CLINICAL DATA:  Pulmonary embolism, hypertension, altered mental status EXAM: CT ANGIOGRAPHY CHEST CT ABDOMEN AND PELVIS WITH CONTRAST TECHNIQUE: Multidetector CT imaging of the chest was performed using the standard protocol during bolus administration of intravenous contrast. Multiplanar CT image reconstructions and MIPs were obtained to evaluate the vascular anatomy. Multidetector CT imaging of the abdomen and pelvis was performed using the standard protocol during bolus administration of intravenous contrast. RADIATION DOSE REDUCTION: This exam was performed according to the departmental dose-optimization program which includes automated exposure control, adjustment of the mA and/or kV according to patient size and/or use of iterative reconstruction technique. CONTRAST:  OMNIPAQUE IOHEXOL 350 MG/ML SOLN COMPARISON:  07/05/2023 FINDINGS: CTA CHEST FINDINGS Cardiovascular: Minimal coronary artery calcification. Global cardiac size within normal limits. No pericardial effusion. Central pulmonary arteries are adequately opacified and there is no intraluminal filling defect identified through the segmental level. Central pulmonary arteries are of normal caliber. The thoracic aorta is unremarkable. Right internal jugular chest port tip is seen within the right atrium. Mediastinum/Nodes: There is infiltrative soft tissue within the right paratracheal space, best seen on axial image # 45/4, which is stable since prior examination and is associated with narrowing of the superior vena cava in this region.  This reflects the sequela of treated disease, better seen on PET CT examination of 10/02/2020. Borderline left pre-vascular lymph node is stable, nonspecific. No new pathologic thoracic adenopathy. Esophagus is unremarkable. Visualized thyroid is unremarkable. Lungs/Pleura: Previously noted focal consolidation within the a posterior right upper lobe and scattered ground-glass infiltrate within the right apex has improved in the interval in keeping with interval resolving infectious or inflammatory process. Residual scarring within the right upper lobe and right lower lobe noted. Mild emphysema. Stable 7 mm pulmonary nodule within the subpleural left upper lobe, axial image # 50/6, indeterminate. While stable since immediate prior examination, this first appeared on prior examination of 04/19/2023 and is indeterminate. No new focal pulmonary nodules or infiltrates. No pneumothorax or pleural effusion. Central airways are widely patent. Musculoskeletal: Widespread sclerotic metastases are again seen throughout the visualized axial skeleton, grossly stable since prior examination. No pathologic fracture. Review of the MIP images confirms the above findings. CT ABDOMEN and PELVIS FINDINGS Hepatobiliary: No focal liver abnormality is seen. No gallstones, gallbladder wall thickening, or biliary dilatation. Pancreas: Unremarkable Spleen: 19 and 16 mm hypoenhancing subcapsular masses within the spleen are stable since prior examination, compatible with splenic metastases. No new intrasplenic lesions. The spleen is of normal size. Adrenals/Urinary Tract: Bilateral adrenal metastases are grossly stable measuring 2.2 x 5.3 cm on the right and 1.3 x 2.5 cm on the left. The kidneys are unremarkable save for a simple cortical cyst within the lower pole the left kidney for which no specific follow-up imaging is recommended. The bladder is unremarkable. Stomach/Bowel: Stomach is within normal limits. Appendix appears normal. No  evidence of bowel wall thickening, distention, or inflammatory changes. Vascular/Lymphatic: Aortic atherosclerosis. No enlarged abdominal or pelvic lymph nodes. Reproductive: Uterus and bilateral adnexa are unremarkable. Other: No abdominal wall hernia or abnormality. No abdominopelvic ascites. Musculoskeletal: Bilateral hip ORIF has been performed. Widespread sclerotic metastases are again seen throughout the  visualized axial skeleton, grossly stable since prior examination. No pathologic fracture identified. Review of the MIP images confirms the above findings. IMPRESSION: 1. No pulmonary embolism. No acute intrathoracic pathology identified. 2. Minimal coronary artery calcification. 3. Stable infiltrative soft tissue within the right paratracheal space, associated with narrowing of the superior vena cava in this region. This reflects the sequela of treated disease, better seen on PET CT examination of 10/02/2020. 4. Stable 7 mm pulmonary nodule within the subpleural left upper lobe, indeterminate. While stable since immediate prior examination, this first appeared on prior examination of 04/19/2023 and a a isolated pulmonary metastasis is not excluded. Close observation on subsequent surveillance examination is warranted. 5. Widespread sclerotic metastases throughout the visualized axial skeleton, grossly stable since prior examination. No pathologic fracture identified. 6. Stable bilateral adrenal metastases. 7. Stable splenic metastases. Aortic Atherosclerosis (ICD10-I70.0) and Emphysema (ICD10-J43.9). Electronically Signed   By: Helyn Numbers M.D.   On: 07/19/2023 18:54   DG Chest Port 1 View  Result Date: 07/19/2023 CLINICAL DATA:  Sepsis EXAM: PORTABLE CHEST 1 VIEW COMPARISON:  07/02/2023 FINDINGS: Lungs are clear. No pneumothorax or pleural effusion. Right internal jugular chest port tip seen within the right atrium. Cardiac size within normal limits. Pulmonary vascularity is normal. No acute bone  abnormality. IMPRESSION: 1. No active disease. Electronically Signed   By: Helyn Numbers M.D.   On: 07/19/2023 18:25   CT Head Wo Contrast  Result Date: 07/19/2023 CLINICAL DATA:  Hypotension and altered mental status EXAM: CT HEAD WITHOUT CONTRAST TECHNIQUE: Contiguous axial images were obtained from the base of the skull through the vertex without intravenous contrast. RADIATION DOSE REDUCTION: This exam was performed according to the departmental dose-optimization program which includes automated exposure control, adjustment of the mA and/or kV according to patient size and/or use of iterative reconstruction technique. COMPARISON:  Brain MRI 07/05/2023 FINDINGS: Brain: There is no acute intracranial hemorrhage, extra-axial fluid collection, or acute infarct. Parenchymal volume is normal. The ventricles are normal in size. Gray-white differentiation is preserved. The pituitary and suprasellar region are normal. Small hyperdense lesions are seen in the left frontal subcortical white matter and right peritrigonal white matter corresponding to known metastatic lesions, better evaluated on recent brain MRI. There is no parenchymal edema or mass effect. There is no midline shift. Vascular: There is calcification of the bilateral carotid siphons. Skull: Normal. Negative for fracture or focal lesion. Sinuses/Orbits: The imaged paranasal sinuses are clear. The globes and orbits are unremarkable. Other: The mastoid air cells and middle ear cavities are clear. IMPRESSION: 1. No acute intracranial pathology. 2. No parenchymal edema or mass effect related to the known intracranial metastatic disease which was better seen on recent brain MRI. Electronically Signed   By: Lesia Hausen M.D.   On: 07/19/2023 17:27   EEG adult  Result Date: 07/06/2023 Jefferson Fuel, MD     07/08/2023  4:13 AM Routine EEG Report LOISTINE EBERLIN is a 57 y.o. female with a history of seizures who is undergoing an EEG to evaluate for  seizures. Report: This EEG was acquired with electrodes placed according to the International 10-20 electrode system (including Fp1, Fp2, F3, F4, C3, C4, P3, P4, O1, O2, T3, T4, T5, T6, A1, A2, Fz, Cz, Pz). The following electrodes were missing or displaced: none. The occipital dominant rhythm was 7 Hz with intermittent more pronounced diffuse slowing. This activity is reactive to stimulation. Drowsiness was manifested by background fragmentation; deeper stages of sleep were not identified. There  was no focal slowing. There were no interictal epileptiform discharges. There were no electrographic seizures identified. There was no abnormal response to photic stimulation or hyperventilation. Impression and clinical correlation: This EEG was obtained while awake and drowsy and is abnormal due to mild diffuse slowing indicative of global cerebral dysfunction. Epileptiform abnormalities were not seen during this recording. Bing Neighbors, MD [facility] [phone] If 7pm- 7am, please page neurology on call as listed in AMION.   MR BRAIN W WO CONTRAST  Result Date: 07/05/2023 CLINICAL DATA:  57 year old female altered mental status. 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 Subsequent encounter. EXAM: MRI HEAD WITHOUT AND WITH CONTRAST TECHNIQUE: Multiplanar, multiecho pulse sequences of the brain and surrounding structures were obtained without and with intravenous contrast. CONTRAST:  7.49mL GADAVIST GADOBUTROL 1 MMOL/ML IV SOLN COMPARISON:  Head CT this morning.  Brain MRI 01/20/2021. FINDINGS: Brain: Motion artifact on postcontrast imaging despite repeated imaging attempts. There are numerable subcentimeter FLAIR and T2 hyperintense foci scattered throughout the bilateral brain (see series 9), some of the largest of which correspond to the hyperdense foci by CT this morning  (including adjacent to the right occipital horn series 9, image 24 measuring 9 mm). None of these are identified on SWI with susceptibility. However, some are mildly T1 hyperintense (such as in the left anterior frontal lobe series 7, image 16). But nearly all of the lesions are conspicuous on diffusion imaging, some are restricted and some have ring like morphology (series 13, image 28). None of the lesions are very apparent on motion degraded axial postcontrast imaging. But a few of them do appear to be enhancing on coronal postcontrast imaging in the posterior left hemisphere (series 21). Furthermore, when comparing pre and postcontrast sagittal T1 weighted imaging, it is suspected that many of the small lesions do abnormally enhance (see series 22). No associated vasogenic edema. And no significant intracranial mass effect. No dural thickening or enhancement. No superimposed restricted diffusion which is strongly suggestive of acute infarction. No ventriculomegaly, extra-axial collection. Cervicomedullary junction and pituitary are within normal limits. Vascular: Major intracranial vascular flow voids are stable. Following contrast the major dural venous sinuses are enhancing and appear to be patent. Skull and upper cervical spine: Thick left superior frontal bone metastasis was better demonstrated by CT this morning, decreased T2 signal there is new since the 2022 MRI. No destructive osseous lesion is identified. Sinuses/Orbits: Stable, negative. Other: Mastoids remain well aerated. Negative visible scalp and face. IMPRESSION: 1. Innumerable subcentimeter brain lesions which are new since 2022. Although most are difficult to identify on motion degraded post-contrast images, the constellation of MRI findings is more suggestive of brain metastases than embolic infarcts or other differential considerations. And it is possible that most of the lesions are treated metastases, with no associated vasogenic edema or  mass effect. A short interval repeat MRI in 2 months may be valuable. 2. Progressed skull metastases since 2022 but no destructive osseous lesion is identified. Electronically Signed   By: Odessa Fleming M.D.   On: 07/05/2023 13:24   CT HEAD WO CONTRAST ( )  Result Date: 07/05/2023 CLINICAL DATA:  57 year old female altered mental status. Possible trace hemorrhage layering in the right occipital horn on head CT 0055 hours today., and small left anterior frontal lobe hyperdensity also. Metastatic lung cancer. EXAM: CT HEAD WITHOUT CONTRAST TECHNIQUE: Contiguous axial images were obtained from the base  of the skull through the vertex without intravenous contrast. RADIATION DOSE REDUCTION: This exam was performed according to the departmental dose-optimization program which includes automated exposure control, adjustment of the mA and/or kV according to patient size and/or use of iterative reconstruction technique. COMPARISON:  Head CT 0055 hours today.  Brain MRI 01/20/2021. FINDINGS: Brain: Unchanged small 5 mm hyperdense foci in both the left anterior frontal and right occipital lobes both visible on series 2, image 13 now. No associated edema or mass effect by CT. The right occipital lesion now is seen to be separate from the occipital horn which is on series 2, image 15. These appear to be parenchymal. Furthermore, there is a subtle 3rd hyperdense lesion in the right anterior inferior temporal lobe sagittal image 21. And possibly additional punctate lesions such as the left frontal lobe on series 2, image 17. All of these are new since 09/16/2022. No midline shift or intracranial mass effect. No ventriculomegaly. No extra-axial blood identified. No cortically based acute infarct identified. Normal basilar cisterns. Vascular: No suspicious intracranial vascular hyperdensity. Skull: Abnormal left frontal bone appears infiltrated with abnormal sclerosis and periosteal reaction on series 3, image 51, new since  09/16/2022. This is compatible with skull metastasis. No superimposed skull fracture is identified. No lytic bone lesion is identified. Sinuses/Orbits: Visualized paranasal sinuses and mastoids are stable and well aerated. Other: Visualized orbits and scalp soft tissues are within normal limits. IMPRESSION: 1. 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 2. At least 3 subcentimeter hyperdense foci in the bilateral brain are more likely small hyperdense Brain Metastases than areas of intracranial blood. Brain MRI without and with contrast should be confirmatory and could be compared to 01/20/2021. 3. No associated intracranial mass effect.  No cerebral edema by CT. Electronically Signed   By: Odessa Fleming M.D.   On: 07/05/2023 07:54   CT CHEST ABDOMEN PELVIS W CONTRAST  Result Date: 07/05/2023 CLINICAL DATA:  Sepsis.  History of metastatic lung cancer. EXAM: CT CHEST, ABDOMEN, AND PELVIS WITH CONTRAST TECHNIQUE: Multidetector CT imaging of the chest, abdomen and pelvis was performed following the standard protocol during bolus administration of intravenous contrast. RADIATION DOSE REDUCTION: This exam was performed according to the departmental dose-optimization program which includes automated exposure control, adjustment of the mA and/or kV according to patient size and/or use of iterative reconstruction technique. CONTRAST:  OMNIPAQUE IOHEXOL 300 MG/ML  SOLN COMPARISON:  CT chest abdomen pelvis 04/19/2023 FINDINGS: CT CHEST FINDINGS Cardiovascular: Right chest wall accessed Port-A-Cath with tip at the superior cavoatrial junction. Normal heart size. No significant pericardial effusion. The thoracic aorta is normal in caliber. No atherosclerotic plaque of the thoracic aorta. No coronary artery calcifications. Mediastinum/Nodes: Stable 1 cm subcarinal lymph node. No enlarged mediastinal, hilar, or axillary lymph nodes. Trachea and esophagus demonstrate no significant findings. Tiny  hiatal hernia. Subcentimeter hypodensity of the right thyroid gland-no further follow-up indicated. Lungs/Pleura: Right upper lobe and lower lobe patchy airspace opacities. Stable left lower lobe 8 x 7 mm subpleural nodule (4:50). No pulmonary mass. No pleural effusion. No pneumothorax. Musculoskeletal: No chest wall abnormality. Chronic diffuse axial and appendicular sclerotic osseous lesions. No acute displaced fracture. Multilevel degenerative changes of the spine. CT ABDOMEN PELVIS FINDINGS Hepatobiliary: Vague hypodensity along the falciform ligament likely focal fatty infiltration. No gallstones, gallbladder wall thickening, or pericholecystic fluid. No biliary dilatation. Pancreas: No focal lesion. Normal pancreatic contour. No surrounding inflammatory changes. No main pancreatic ductal dilatation. Spleen: Normal in size.  Slightly more conspicuous splenic hypodensities measuring of 2 cm (2:53). Adrenals/Urinary Tract: Stable left adrenal gland nodules measuring of to 2.4 x 1.2 cm. Stable right adrenal gland nodules measuring up to 4.6 x 1.6 cm. Bilateral kidneys enhance symmetrically. No hydronephrosis. No hydroureter. The urinary bladder is unremarkable. Stomach/Bowel: Stomach is within normal limits. No evidence of bowel wall thickening or dilatation. Appendix appears normal. Vascular/Lymphatic: No abdominal aorta or iliac aneurysm. Mild atherosclerotic plaque of the aorta and its branches. No abdominal, pelvic, or inguinal lymphadenopathy. Reproductive: Uterus and bilateral adnexa are unremarkable. Other: No intraperitoneal free fluid. No intraperitoneal free gas. No organized fluid collection. Musculoskeletal: No abdominal wall hernia or abnormality. Chronic diffuse axial and appendicular sclerotic osseous lesions. No acute displaced fracture. Six lumbar vertebral bodies with sacralization of the L6 level. Grade 1 anterolisthesis L4 on L5. Mild retrolisthesis of L1 on L2. Bilateral intramedullary nail  fixation of the femurs. Persistent periprosthetic lucency along the right femoral shaft surgical hardware. IMPRESSION: 1. Multifocal right lung pneumonia versus aspiration pneumonia. Underlying malignancy not fully excluded. 2. Stable left lower lobe 8 x 7 mm subpleural nodule. 3. Stable indeterminate bilateral adrenal gland nodules. 4. Stable indeterminate splenic hypodensities. 5. Chronic diffuse axial and appendicular sclerotic osseous metastases. Electronically Signed   By: Tish Frederickson M.D.   On: 07/05/2023 02:23   CT HEAD WO CONTRAST ( )  Result Date: 07/05/2023 CLINICAL DATA:  Mental status change, unknown cause Pt discharged yesterday after rod placement in L leg - pt altered since d/c yesterday. EXAM: CT HEAD WITHOUT CONTRAST TECHNIQUE: Contiguous axial images were obtained from the base of the skull through the vertex without intravenous contrast. RADIATION DOSE REDUCTION: This exam was performed according to the departmental dose-optimization program which includes automated exposure control, adjustment of the mA and/or kV according to patient size and/or use of iterative reconstruction technique. COMPARISON:  CT head 09/16/2022 FINDINGS: Brain: No evidence of large-territorial acute infarction. No definite parenchymal hemorrhage. No mass lesion. No definite extra-axial collection. A 3 mm hyperdensity in the region of the posterior horn of the right lateral ventricle. Question vague 4 mm hyperdensity versus artifact within the left frontal lobe (3:14). No mass effect or midline shift. No hydrocephalus. Basilar cisterns are patent. Vascular: No hyperdense vessel. Skull: No acute fracture or focal lesion. Sinuses/Orbits: Paranasal sinuses and mastoid air cells are clear. The orbits are unremarkable. Other: None. IMPRESSION: 1. A 3 mm hyperdensity in the region of the posterior horn of the right lateral ventricle. Indeterminate etiology with hemorrhage not excluded. Recommend follow-up CT in 4-6  hours to evaluate for stability. 2. Question vague 4 mm hyperdensity versus artifact within the left frontal lobe. Recommended attention on follow-up Electronically Signed   By: Tish Frederickson M.D.   On: 07/05/2023 02:01   DG Chest 1 View  Result Date: 07/02/2023 CLINICAL DATA:  Tachycardia EXAM: CHEST  1 VIEW COMPARISON:  None Available. FINDINGS: Abnormal mediastinal silhouette, compatible with known adenopathy. New somewhat nodular opacity in the right midlung. No visible pleural effusions or pneumothorax. Polyarticular degenerative change. IMPRESSION: 1. New somewhat nodular opacity in the right midlung which could represent progressive metastatic disease, treatment change, and/or infection. CT of the chest with contrast could further evaluate if clinically warranted 2. Abnormal mediastinal silhouette, compatible with known lymphadenopathy. Electronically Signed   By: Feliberto Harts M.D.   On: 07/02/2023 13:02   DG HIP UNILAT WITH PELVIS 2-3 VIEWS LEFT  Result Date: 06/30/2023 CLINICAL DATA:  Left hip surgery.  Intraoperative fluoroscopy.  EXAM: DG HIP (WITH OR WITHOUT PELVIS) 2-3V LEFT COMPARISON:  Left femur radiographs 04/20/2023 FINDINGS: Images were performed intraoperatively without the presence of a radiologist. Interval left femoral long intramedullary nail fixation, spanning the multiple previously seen sclerotic left femoral bone diaphyses. No hardware complication is seen. Total fluoroscopy images: 4 Total fluoroscopy time: 120 seconds Total dose: Radiation Exposure Index (as provided by the fluoroscopic device): 20.73 mGy air Kerma Please see intraoperative findings for further detail. IMPRESSION: Intraoperative fluoroscopy for left femoral long intramedullary nail fixation. Electronically Signed   By: Neita Garnet M.D.   On: 06/30/2023 16:44   DG C-Arm 1-60 Min-No Report  Result Date: 06/30/2023 Fluoroscopy was utilized by the requesting physician.  No radiographic interpretation.    DG C-Arm 1-60 Min-No Report  Result Date: 06/30/2023 Fluoroscopy was utilized by the requesting physician.  No radiographic interpretation.    Assessment and plan- Patient is a 57 y.o. female with history of metastatic adenocarcinoma of the lung admitted for acute encephalopathy and hypotension  Acute encephalopathy possibly secondary to hyponatremia.  Has improved after IV fluids.  CT head did not show any acute findings.  Metastatic lung adenocarcinoma: She had a CT chest abdomen and pelvis with contrast which overall does not show any evidence of progressive disease.  She does have widespread metastases involving lymph nodes spleen and bone which all appear stable presently.  Plan is to restart a Avastin in 2 weeks if overall patient feels better.  Hypotension: Improved with IV hydration.  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  Neoplasm related pain: Continue as needed Dilaudid and fentanyl patch  Goals of care: Palliative care is also on board. Overall if patient continues to improve her strength and has a better quality of life we will resume outpatient  Avastin.   Thank you for this kind referral and the opportunity to participate in the care of this  Patient   Visit Diagnosis 1. Altered mental status, unspecified altered mental status type   2. Sepsis, due to unspecified organism, unspecified whether acute organ dysfunction present [facility])     Dr. Owens Shark, MD, MPH [facility] [phone] 07/20/2023

## 2023-07-20 NOTE — Consult Note (Signed)
Palliative Medicine [facility] at [facility] [phone] [phone]   Name: Gina Sanchez Date: 07/20/2023 MRN: [id number]  DOB: 1966/01/07  Patient Care Team: Armando Gang, FNP as PCP - General (Family Medicine) Glory Buff, RN as Oncology Nurse Navigator Carmina Miller, MD as Radiation Oncologist (Radiation Oncology) Vida Rigger, MD as Consulting Physician (Pulmonary Disease) Creig Hines, MD as Consulting Physician (Oncology)    REASON FOR CONSULTATION: Gina Sanchez is a 57 y.o. 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  Patient was hospitalized 06/30/2023 to 07/03/2023 with pathologic left femur fracture requiring pinning.  Patient was readmitted on 07/05/2023 - 07/09/2023 with sepsis and acute metabolic encephalopathy from pneumonia.  She is now readmitted on 07/19/2023 with SIRS and AMS. Palliative care was consulted to address goals and manage ongoing symptoms.  SOCIAL HISTORY:     reports that she quit smoking about 2 years ago. Her smoking use included cigarettes. She started smoking about 32 years ago. She has a 30 pack-year smoking history. She has never used smokeless tobacco. She reports that she does not currently use alcohol after a past usage of about 1.0 standard drink of alcohol per week. She reports that she does not use drugs.  Patient is married lives at home with her husband.  ADVANCE DIRECTIVES:  Not on file  CODE STATUS: DNR  PAST MEDICAL HISTORY: Past Medical History:  Diagnosis Date   Abnormal Pap smear of cervix    Anxiety    C. 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   Chronic diarrhea    Closed fracture of distal end of left radius 03/23/2021   Colon polyp    Depression    Diverticulosis    Essential hypertension    Femur fracture, right (HCC) 09/14/2020   GERD  (gastroesophageal reflux disease)    Hyperlipidemia    Leukocytosis    Lumbar radiculopathy    Lung cancer (HCC)    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   Metastatic cancer (HCC) 10/2020   lung, bone, lymph node, adrenal   Palpitations    Precordial chest pain    Wrist fracture 03/2021   left    PAST SURGICAL HISTORY:  Past Surgical History:  Procedure Laterality Date   BREAST BIOPSY Right 2017   benign   CERVICAL BIOPSY  W/ LOOP ELECTRODE EXCISION     COLONOSCOPY  06/11/2022   Procedure: COLONOSCOPY;  Surgeon: Toney Reil, MD;  Location: [facility] ENDOSCOPY;  Service: Gastroenterology;;   COLONOSCOPY WITH PROPOFOL N/A 07/03/2015   Procedure: COLONOSCOPY WITH PROPOFOL;  Surgeon: Wallace Cullens, MD;  Location: [facility] ENDOSCOPY;  Service: Gastroenterology;  Laterality: N/A;   COLONOSCOPY WITH PROPOFOL N/A 01/06/2022   Procedure: COLONOSCOPY WITH PROPOFOL;  Surgeon: Toney Reil, MD;  Location: [facility] ENDOSCOPY;  Service: Gastroenterology;  Laterality: N/A;   ESOPHAGOGASTRODUODENOSCOPY (EGD) WITH PROPOFOL N/A 07/03/2015   Procedure: ESOPHAGOGASTRODUODENOSCOPY (EGD) WITH PROPOFOL;  Surgeon: Wallace Cullens, MD;  Location: [facility] ENDOSCOPY;  Service: Gastroenterology;  Laterality: N/A;   FEMUR IM NAIL Left 06/30/2023   Procedure: Left prophylactic intramedullary nailing of impending left subtrochanteric femur fracture;  Surgeon: Christena Flake, MD;  Location: [facility] ORS;  Service: Orthopedics;  Laterality: Left;   FLEXIBLE SIGMOIDOSCOPY N/A 03/12/2022   Procedure: FLEXIBLE SIGMOIDOSCOPY;  Surgeon: Toney Reil, MD;  Location: [facility] ENDOSCOPY;  Service: Gastroenterology;  Laterality:  N/A;   FLEXIBLE SIGMOIDOSCOPY N/A 08/17/2022   Procedure: FLEXIBLE SIGMOIDOSCOPY;  Surgeon: Toney Reil, MD;  Location: [facility] ENDOSCOPY;  Service: Gastroenterology;  Laterality: N/A;   INTRAMEDULLARY (IM) NAIL INTERTROCHANTERIC Right 09/15/2020   Procedure: INTRAMEDULLARY (IM) NAIL INTERTROCHANTRIC;   Surgeon: Christena Flake, MD;  Location: [facility] ORS;  Service: Orthopedics;  Laterality: Right;   IR CV LINE INJECTION  07/14/2022   IR IMAGING GUIDED PORT INSERTION  11/28/2020   LEFT HEART CATH AND CORONARY ANGIOGRAPHY N/A 03/28/2017   Procedure: Left Heart Cath and Coronary Angiography;  Surgeon: Runell Gess, MD;  Location: [facility] INVASIVE CV LAB;  Service: Cardiovascular;  Laterality: N/A;   ORIF WRIST FRACTURE Left 03/31/2021   Procedure: OPEN REDUCTION INTERNAL FIXATION (ORIF) LEFT DISTAL RADIUS FRACTURE.;  Surgeon: Christena Flake, MD;  Location: [facility] ORS;  Service: Orthopedics;  Laterality: Left;    HEMATOLOGY/ONCOLOGY HISTORY:  Oncology History  Metastatic lung cancer (metastasis from lung to other site) [facility])  10/19/2020 Initial Diagnosis   Metastatic lung cancer (metastasis from lung to other site) [facility])   10/19/2020 Cancer Staging   Staging form: Lung, AJCC 8th Edition - Clinical: Stage IV (cT1, cN3, pM1) - Signed by Creig Hines, MD on 10/19/2020   10/27/2020 - 02/12/2022 Chemotherapy   Patient is on Treatment Plan : LUNG NSCLC Pemetrexed (Alimta) / Carboplatin q21d x 1 cycles     06/08/2022 - 09/15/2022 Chemotherapy   Patient is on Treatment Plan : LUNG NSCLC Pemetrexed + Carboplatin q21d x 4 Cycles     02/17/2023 -  Chemotherapy   Patient is on Treatment Plan : LUNG NSCLC Carboplatin + Paclitaxel + Bevacizumab q21d       ALLERGIES:  is allergic to factive [gemifloxacin].  MEDICATIONS:  Current Facility-Administered Medications  Medication Dose Route Frequency Provider Last Rate Last Admin   acetaminophen (TYLENOL) tablet 650 mg  650 mg Oral Q6H PRN Verdene Lennert, MD       Or   acetaminophen (TYLENOL) suppository 650 mg  650 mg Rectal Q6H PRN Verdene Lennert, MD       ALPRAZolam Prudy Feeler) tablet 0.5 mg  0.5 mg Oral Daily PRN Verdene Lennert, MD       Chlorhexidine Gluconate Cloth 2 % PADS 6 each  6 each Topical Daily Wieting, Richard, MD       citalopram (CELEXA) tablet 10 mg   10 mg Oral Daily Verdene Lennert, MD       dexamethasone (DECADRON) tablet 4 mg  4 mg Oral BID Verdene Lennert, MD   4 mg at 07/20/23 0920   enoxaparin (LOVENOX) injection 40 mg  40 mg Subcutaneous Q24H Verdene Lennert, MD   40 mg at 07/19/23 2303   feeding supplement (ENSURE ENLIVE / ENSURE PLUS) liquid 237 mL  237 mL Oral TID BM Wieting, Richard, MD       fentaNYL (DURAGESIC) 25 MCG/HR 1 patch  1 patch Transdermal Q72H Verdene Lennert, MD       hydrocortisone (CORTEF) tablet 10 mg  10 mg Oral QHS Verdene Lennert, MD       hydrocortisone (CORTEF) tablet 20 mg  20 mg Oral q morning Verdene Lennert, MD   20 mg at 07/20/23 0920   HYDROmorphone (DILAUDID) tablet 2-4 mg  2-4 mg Oral Q4H PRN Verdene Lennert, MD   4 mg at 07/20/23 0919   lactulose (CHRONULAC) 10 GM/15ML solution 30 g  30 g Oral TID Alford Highland, MD       levETIRAcetam (KEPPRA) tablet  500 mg  500 mg Oral BID Verdene Lennert, MD   500 mg at 07/20/23 0920   magic mouthwash w/lidocaine  5 mL Oral QID Verdene Lennert, MD   5 mL at 07/20/23 0920   multivitamin with minerals tablet 1 tablet  1 tablet Oral Daily Alford Highland, MD       ondansetron [facility]) tablet 4 mg  4 mg Oral Q6H PRN Verdene Lennert, MD       Or   ondansetron (ZOFRAN) injection 4 mg  4 mg Intravenous Q6H PRN Verdene Lennert, MD       polyethylene glycol (MIRALAX / GLYCOLAX) packet 17 g  17 g Oral Daily Alford Highland, MD   17 g at 07/20/23 0919   sodium chloride flush (NS) 0.9 % injection 3 mL  3 mL Intravenous Q12H Verdene Lennert, MD   3 mL at 07/20/23 1610   Facility-Administered Medications Ordered in Other Encounters  Medication Dose Route Frequency Provider Last Rate Last Admin   sodium chloride flush (NS) 0.9 % injection 10 mL  10 mL Intravenous PRN Creig Hines, MD   10 mL at 03/09/21 0906    VITAL SIGNS: BP (!) 123/92 (BP Location: Left Arm)   Pulse 93   Temp 97.9 F (36.6 C)   Resp 18   Ht 5\' 6"  (1.676 m)   Wt 178 lb 9.2 oz (81 kg)   LMP  09/15/2018   SpO2 100%   BMI 28.82 kg/m  Filed Weights   07/19/23 1425  Weight: 178 lb 9.2 oz (81 kg)    Estimated body mass index is 28.82 kg/m as calculated from the following:   Height as of this encounter: 5\' 6"  (1.676 m).   Weight as of this encounter: 178 lb 9.2 oz (81 kg).  LABS: CBC:    Component Value Date/Time   WBC 11.9 (H) 07/20/2023 0540   HGB 11.2 (L) 07/20/2023 0540   HGB 13.2 07/19/2023 1354   HGB 13.0 05/31/2022 1156   HCT 33.8 (L) 07/20/2023 0540   HCT 40.9 05/31/2022 1156   PLT 240 07/20/2023 0540   PLT 306 07/19/2023 1354   PLT 331 05/31/2022 1156   MCV 84.9 07/20/2023 0540   MCV 87 05/31/2022 1156   NEUTROABS 10.6 (H) 07/20/2023 0540   NEUTROABS 6.5 03/14/2018 1343   LYMPHSABS 0.5 (L) 07/20/2023 0540   LYMPHSABS 4.1 (H) 03/14/2018 1343   MONOABS 0.6 07/20/2023 0540   EOSABS 0.0 07/20/2023 0540   EOSABS 0.4 03/14/2018 1343   BASOSABS 0.0 07/20/2023 0540   BASOSABS 0.0 03/14/2018 1343   Comprehensive Metabolic Panel:    Component Value Date/Time   NA 134 (L) 07/20/2023 0540   NA 141 05/31/2022 1156   K 4.2 07/20/2023 0540   CL 102 07/20/2023 0540   CO2 21 (L) 07/20/2023 0540   BUN 18 07/20/2023 0540   BUN 8 05/31/2022 1156   CREATININE <0.30 (L) 07/20/2023 0540   CREATININE 0.58 07/19/2023 1354   GLUCOSE 104 (H) 07/20/2023 0540   CALCIUM 8.3 (L) 07/20/2023 0540   AST 28 07/20/2023 0540   AST 38 07/19/2023 1354   ALT 35 07/20/2023 0540   ALT 40 07/19/2023 1354   ALKPHOS 181 (H) 07/20/2023 0540   BILITOT 1.3 (H) 07/20/2023 0540   BILITOT 1.4 (H) 07/19/2023 1354   PROT 6.2 (L) 07/20/2023 0540   PROT 5.9 (L) 05/31/2022 1156   ALBUMIN 3.0 (L) 07/20/2023 0540   ALBUMIN 3.4 (L) 05/31/2022 1156  RADIOGRAPHIC STUDIES: CT ABDOMEN PELVIS W CONTRAST  Result Date: 07/19/2023 CLINICAL DATA:  Pulmonary embolism, hypertension, altered mental status EXAM: CT ANGIOGRAPHY CHEST CT ABDOMEN AND PELVIS WITH CONTRAST TECHNIQUE: Multidetector CT  imaging of the chest was performed using the standard protocol during bolus administration of intravenous contrast. Multiplanar CT image reconstructions and MIPs were obtained to evaluate the vascular anatomy. Multidetector CT imaging of the abdomen and pelvis was performed using the standard protocol during bolus administration of intravenous contrast. RADIATION DOSE REDUCTION: This exam was performed according to the departmental dose-optimization program which includes automated exposure control, adjustment of the mA and/or kV according to patient size and/or use of iterative reconstruction technique. CONTRAST:  OMNIPAQUE IOHEXOL 350 MG/ML SOLN COMPARISON:  07/05/2023 FINDINGS: CTA CHEST FINDINGS Cardiovascular: Minimal coronary artery calcification. Global cardiac size within normal limits. No pericardial effusion. Central pulmonary arteries are adequately opacified and there is no intraluminal filling defect identified through the segmental level. Central pulmonary arteries are of normal caliber. The thoracic aorta is unremarkable. Right internal jugular chest port tip is seen within the right atrium. Mediastinum/Nodes: There is infiltrative soft tissue within the right paratracheal space, best seen on axial image # 45/4, which is stable since prior examination and is associated with narrowing of the superior vena cava in this region. This reflects the sequela of treated disease, better seen on PET CT examination of 10/02/2020. Borderline left pre-vascular lymph node is stable, nonspecific. No new pathologic thoracic adenopathy. Esophagus is unremarkable. Visualized thyroid is unremarkable. Lungs/Pleura: Previously noted focal consolidation within the a posterior right upper lobe and scattered ground-glass infiltrate within the right apex has improved in the interval in keeping with interval resolving infectious or inflammatory process. Residual scarring within the right upper lobe and right lower lobe  noted. Mild emphysema. Stable 7 mm pulmonary nodule within the subpleural left upper lobe, axial image # 50/6, indeterminate. While stable since immediate prior examination, this first appeared on prior examination of 04/19/2023 and is indeterminate. No new focal pulmonary nodules or infiltrates. No pneumothorax or pleural effusion. Central airways are widely patent. Musculoskeletal: Widespread sclerotic metastases are again seen throughout the visualized axial skeleton, grossly stable since prior examination. No pathologic fracture. Review of the MIP images confirms the above findings. CT ABDOMEN and PELVIS FINDINGS Hepatobiliary: No focal liver abnormality is seen. No gallstones, gallbladder wall thickening, or biliary dilatation. Pancreas: Unremarkable Spleen: 19 and 16 mm hypoenhancing subcapsular masses within the spleen are stable since prior examination, compatible with splenic metastases. No new intrasplenic lesions. The spleen is of normal size. Adrenals/Urinary Tract: Bilateral adrenal metastases are grossly stable measuring 2.2 x 5.3 cm on the right and 1.3 x 2.5 cm on the left. The kidneys are unremarkable save for a simple cortical cyst within the lower pole the left kidney for which no specific follow-up imaging is recommended. The bladder is unremarkable. Stomach/Bowel: Stomach is within normal limits. Appendix appears normal. No evidence of bowel wall thickening, distention, or inflammatory changes. Vascular/Lymphatic: Aortic atherosclerosis. No enlarged abdominal or pelvic lymph nodes. Reproductive: Uterus and bilateral adnexa are unremarkable. Other: No abdominal wall hernia or abnormality. No abdominopelvic ascites. Musculoskeletal: Bilateral hip ORIF has been performed. Widespread sclerotic metastases are again seen throughout the visualized axial skeleton, grossly stable since prior examination. No pathologic fracture identified. Review of the MIP images confirms the above findings. IMPRESSION:  1. No pulmonary embolism. No acute intrathoracic pathology identified. 2. Minimal coronary artery calcification. 3. Stable infiltrative soft tissue within the right paratracheal  space, associated with narrowing of the superior vena cava in this region. This reflects the sequela of treated disease, better seen on PET CT examination of 10/02/2020. 4. Stable 7 mm pulmonary nodule within the subpleural left upper lobe, indeterminate. While stable since immediate prior examination, this first appeared on prior examination of 04/19/2023 and a a isolated pulmonary metastasis is not excluded. Close observation on subsequent surveillance examination is warranted. 5. Widespread sclerotic metastases throughout the visualized axial skeleton, grossly stable since prior examination. No pathologic fracture identified. 6. Stable bilateral adrenal metastases. 7. Stable splenic metastases. Aortic Atherosclerosis (ICD10-I70.0) and Emphysema (ICD10-J43.9). Electronically Signed   By: Helyn Numbers M.D.   On: 07/19/2023 18:54   CT Angio Chest PE W and/or Wo Contrast  Result Date: 07/19/2023 CLINICAL DATA:  Pulmonary embolism, hypertension, altered mental status EXAM: CT ANGIOGRAPHY CHEST CT ABDOMEN AND PELVIS WITH CONTRAST TECHNIQUE: Multidetector CT imaging of the chest was performed using the standard protocol during bolus administration of intravenous contrast. Multiplanar CT image reconstructions and MIPs were obtained to evaluate the vascular anatomy. Multidetector CT imaging of the abdomen and pelvis was performed using the standard protocol during bolus administration of intravenous contrast. RADIATION DOSE REDUCTION: This exam was performed according to the departmental dose-optimization program which includes automated exposure control, adjustment of the mA and/or kV according to patient size and/or use of iterative reconstruction technique. CONTRAST:  OMNIPAQUE IOHEXOL 350 MG/ML SOLN COMPARISON:  07/05/2023  FINDINGS: CTA CHEST FINDINGS Cardiovascular: Minimal coronary artery calcification. Global cardiac size within normal limits. No pericardial effusion. Central pulmonary arteries are adequately opacified and there is no intraluminal filling defect identified through the segmental level. Central pulmonary arteries are of normal caliber. The thoracic aorta is unremarkable. Right internal jugular chest port tip is seen within the right atrium. Mediastinum/Nodes: There is infiltrative soft tissue within the right paratracheal space, best seen on axial image # 45/4, which is stable since prior examination and is associated with narrowing of the superior vena cava in this region. This reflects the sequela of treated disease, better seen on PET CT examination of 10/02/2020. Borderline left pre-vascular lymph node is stable, nonspecific. No new pathologic thoracic adenopathy. Esophagus is unremarkable. Visualized thyroid is unremarkable. Lungs/Pleura: Previously noted focal consolidation within the a posterior right upper lobe and scattered ground-glass infiltrate within the right apex has improved in the interval in keeping with interval resolving infectious or inflammatory process. Residual scarring within the right upper lobe and right lower lobe noted. Mild emphysema. Stable 7 mm pulmonary nodule within the subpleural left upper lobe, axial image # 50/6, indeterminate. While stable since immediate prior examination, this first appeared on prior examination of 04/19/2023 and is indeterminate. No new focal pulmonary nodules or infiltrates. No pneumothorax or pleural effusion. Central airways are widely patent. Musculoskeletal: Widespread sclerotic metastases are again seen throughout the visualized axial skeleton, grossly stable since prior examination. No pathologic fracture. Review of the MIP images confirms the above findings. CT ABDOMEN and PELVIS FINDINGS Hepatobiliary: No focal liver abnormality is seen. No  gallstones, gallbladder wall thickening, or biliary dilatation. Pancreas: Unremarkable Spleen: 19 and 16 mm hypoenhancing subcapsular masses within the spleen are stable since prior examination, compatible with splenic metastases. No new intrasplenic lesions. The spleen is of normal size. Adrenals/Urinary Tract: Bilateral adrenal metastases are grossly stable measuring 2.2 x 5.3 cm on the right and 1.3 x 2.5 cm on the left. The kidneys are unremarkable save for a simple cortical cyst within the lower pole the  left kidney for which no specific follow-up imaging is recommended. The bladder is unremarkable. Stomach/Bowel: Stomach is within normal limits. Appendix appears normal. No evidence of bowel wall thickening, distention, or inflammatory changes. Vascular/Lymphatic: Aortic atherosclerosis. No enlarged abdominal or pelvic lymph nodes. Reproductive: Uterus and bilateral adnexa are unremarkable. Other: No abdominal wall hernia or abnormality. No abdominopelvic ascites. Musculoskeletal: Bilateral hip ORIF has been performed. Widespread sclerotic metastases are again seen throughout the visualized axial skeleton, grossly stable since prior examination. No pathologic fracture identified. Review of the MIP images confirms the above findings. IMPRESSION: 1. No pulmonary embolism. No acute intrathoracic pathology identified. 2. Minimal coronary artery calcification. 3. Stable infiltrative soft tissue within the right paratracheal space, associated with narrowing of the superior vena cava in this region. This reflects the sequela of treated disease, better seen on PET CT examination of 10/02/2020. 4. Stable 7 mm pulmonary nodule within the subpleural left upper lobe, indeterminate. While stable since immediate prior examination, this first appeared on prior examination of 04/19/2023 and a a isolated pulmonary metastasis is not excluded. Close observation on subsequent surveillance examination is warranted. 5. Widespread  sclerotic metastases throughout the visualized axial skeleton, grossly stable since prior examination. No pathologic fracture identified. 6. Stable bilateral adrenal metastases. 7. Stable splenic metastases. Aortic Atherosclerosis (ICD10-I70.0) and Emphysema (ICD10-J43.9). Electronically Signed   By: Helyn Numbers M.D.   On: 07/19/2023 18:54   DG Chest Port 1 View  Result Date: 07/19/2023 CLINICAL DATA:  Sepsis EXAM: PORTABLE CHEST 1 VIEW COMPARISON:  07/02/2023 FINDINGS: Lungs are clear. No pneumothorax or pleural effusion. Right internal jugular chest port tip seen within the right atrium. Cardiac size within normal limits. Pulmonary vascularity is normal. No acute bone abnormality. IMPRESSION: 1. No active disease. Electronically Signed   By: Helyn Numbers M.D.   On: 07/19/2023 18:25   CT Head Wo Contrast  Result Date: 07/19/2023 CLINICAL DATA:  Hypotension and altered mental status EXAM: CT HEAD WITHOUT CONTRAST TECHNIQUE: Contiguous axial images were obtained from the base of the skull through the vertex without intravenous contrast. RADIATION DOSE REDUCTION: This exam was performed according to the departmental dose-optimization program which includes automated exposure control, adjustment of the mA and/or kV according to patient size and/or use of iterative reconstruction technique. COMPARISON:  Brain MRI 07/05/2023 FINDINGS: Brain: There is no acute intracranial hemorrhage, extra-axial fluid collection, or acute infarct. Parenchymal volume is normal. The ventricles are normal in size. Gray-white differentiation is preserved. The pituitary and suprasellar region are normal. Small hyperdense lesions are seen in the left frontal subcortical white matter and right peritrigonal white matter corresponding to known metastatic lesions, better evaluated on recent brain MRI. There is no parenchymal edema or mass effect. There is no midline shift. Vascular: There is calcification of the bilateral carotid  siphons. Skull: Normal. Negative for fracture or focal lesion. Sinuses/Orbits: The imaged paranasal sinuses are clear. The globes and orbits are unremarkable. Other: The mastoid air cells and middle ear cavities are clear. IMPRESSION: 1. No acute intracranial pathology. 2. No parenchymal edema or mass effect related to the known intracranial metastatic disease which was better seen on recent brain MRI. Electronically Signed   By: Lesia Hausen M.D.   On: 07/19/2023 17:27   EEG adult  Result Date: 07/06/2023 Jefferson Fuel, MD     07/08/2023  4:13 AM Routine EEG Report SAMINA WEEKES is a 56 y.o. female with a history of seizures who is undergoing an EEG to evaluate for seizures. Report:  This EEG was acquired with electrodes placed according to the International 10-20 electrode system (including Fp1, Fp2, F3, F4, C3, C4, P3, P4, O1, O2, T3, T4, T5, T6, A1, A2, Fz, Cz, Pz). The following electrodes were missing or displaced: none. The occipital dominant rhythm was 7 Hz with intermittent more pronounced diffuse slowing. This activity is reactive to stimulation. Drowsiness was manifested by background fragmentation; deeper stages of sleep were not identified. There was no focal slowing. There were no interictal epileptiform discharges. There were no electrographic seizures identified. There was no abnormal response to photic stimulation or hyperventilation. Impression and clinical correlation: This EEG was obtained while awake and drowsy and is abnormal due to mild diffuse slowing indicative of global cerebral dysfunction. Epileptiform abnormalities were not seen during this recording. Bing Neighbors, MD [facility] [phone] If 7pm- 7am, please page neurology on call as listed in AMION.   MR BRAIN W WO CONTRAST  Result Date: 07/05/2023 CLINICAL DATA:  57 year old female altered mental status. Evidence of progressed left frontal bone skull metastasis since 2022 on CT today, and multiple  subcentimeter hypodense foci in the brain suspicious for cerebral metastases. Subsequent encounter. EXAM: MRI HEAD WITHOUT AND WITH CONTRAST TECHNIQUE: Multiplanar, multiecho pulse sequences of the brain and surrounding structures were obtained without and with intravenous contrast. CONTRAST:  7.70mL GADAVIST GADOBUTROL 1 MMOL/ML IV SOLN COMPARISON:  Head CT this morning.  Brain MRI 01/20/2021. FINDINGS: Brain: Motion artifact on postcontrast imaging despite repeated imaging attempts. There are numerable subcentimeter FLAIR and T2 hyperintense foci scattered throughout the bilateral brain (see series 9), some of the largest of which correspond to the hyperdense foci by CT this morning (including adjacent to the right occipital horn series 9, image 24 measuring 9 mm). None of these are identified on SWI with susceptibility. However, some are mildly T1 hyperintense (such as in the left anterior frontal lobe series 7, image 16). But nearly all of the lesions are conspicuous on diffusion imaging, some are restricted and some have ring like morphology (series 13, image 28). None of the lesions are very apparent on motion degraded axial postcontrast imaging. But a few of them do appear to be enhancing on coronal postcontrast imaging in the posterior left hemisphere (series 21). Furthermore, when comparing pre and postcontrast sagittal T1 weighted imaging, it is suspected that many of the small lesions do abnormally enhance (see series 22). No associated vasogenic edema. And no significant intracranial mass effect. No dural thickening or enhancement. No superimposed restricted diffusion which is strongly suggestive of acute infarction. No ventriculomegaly, extra-axial collection. Cervicomedullary junction and pituitary are within normal limits. Vascular: Major intracranial vascular flow voids are stable. Following contrast the major dural venous sinuses are enhancing and appear to be patent. Skull and upper cervical spine:  Thick left superior frontal bone metastasis was better demonstrated by CT this morning, decreased T2 signal there is new since the 2022 MRI. No destructive osseous lesion is identified. Sinuses/Orbits: Stable, negative. Other: Mastoids remain well aerated. Negative visible scalp and face. IMPRESSION: 1. Innumerable subcentimeter brain lesions which are new since 2022. Although most are difficult to identify on motion degraded post-contrast images, the constellation of MRI findings is more suggestive of brain metastases than embolic infarcts or other differential considerations. And it is possible that most of the lesions are treated metastases, with no associated vasogenic edema or mass effect. A short interval repeat MRI in 2 months may be valuable. 2. Progressed skull metastases since 2022 but no destructive osseous lesion  is identified. Electronically Signed   By: Odessa Fleming M.D.   On: 07/05/2023 13:24   CT HEAD WO CONTRAST ( )  Result Date: 07/05/2023 CLINICAL DATA:  57 year old female altered mental status. Possible trace hemorrhage layering in the right occipital horn on head CT 0055 hours today., and small left anterior frontal lobe hyperdensity also. Metastatic lung cancer. EXAM: CT HEAD WITHOUT CONTRAST TECHNIQUE: Contiguous axial images were obtained from the base of the skull through the vertex without intravenous contrast. RADIATION DOSE REDUCTION: This exam was performed according to the departmental dose-optimization program which includes automated exposure control, adjustment of the mA and/or kV according to patient size and/or use of iterative reconstruction technique. COMPARISON:  Head CT 0055 hours today.  Brain MRI 01/20/2021. FINDINGS: Brain: Unchanged small 5 mm hyperdense foci in both the left anterior frontal and right occipital lobes both visible on series 2, image 13 now. No associated edema or mass effect by CT. The right occipital lesion now is seen to be separate from the occipital  horn which is on series 2, image 15. These appear to be parenchymal. Furthermore, there is a subtle 3rd hyperdense lesion in the right anterior inferior temporal lobe sagittal image 21. And possibly additional punctate lesions such as the left frontal lobe on series 2, image 17. All of these are new since 09/16/2022. No midline shift or intracranial mass effect. No ventriculomegaly. No extra-axial blood identified. No cortically based acute infarct identified. Normal basilar cisterns. Vascular: No suspicious intracranial vascular hyperdensity. Skull: Abnormal left frontal bone appears infiltrated with abnormal sclerosis and periosteal reaction on series 3, image 51, new since 09/16/2022. This is compatible with skull metastasis. No superimposed skull fracture is identified. No lytic bone lesion is identified. Sinuses/Orbits: Visualized paranasal sinuses and mastoids are stable and well aerated. Other: Visualized orbits and scalp soft tissues are within normal limits. IMPRESSION: 1. 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 2. At least 3 subcentimeter hyperdense foci in the bilateral brain are more likely small hyperdense Brain Metastases than areas of intracranial blood. Brain MRI without and with contrast should be confirmatory and could be compared to 01/20/2021. 3. No associated intracranial mass effect.  No cerebral edema by CT. Electronically Signed   By: Odessa Fleming M.D.   On: 07/05/2023 07:54   CT CHEST ABDOMEN PELVIS W CONTRAST  Result Date: 07/05/2023 CLINICAL DATA:  Sepsis.  History of metastatic lung cancer. EXAM: CT CHEST, ABDOMEN, AND PELVIS WITH CONTRAST TECHNIQUE: Multidetector CT imaging of the chest, abdomen and pelvis was performed following the standard protocol during bolus administration of intravenous contrast. RADIATION DOSE REDUCTION: This exam was performed according to the departmental dose-optimization program which includes automated exposure control,  adjustment of the mA and/or kV according to patient size and/or use of iterative reconstruction technique. CONTRAST:  OMNIPAQUE IOHEXOL 300 MG/ML  SOLN COMPARISON:  CT chest abdomen pelvis 04/19/2023 FINDINGS: CT CHEST FINDINGS Cardiovascular: Right chest wall accessed Port-A-Cath with tip at the superior cavoatrial junction. Normal heart size. No significant pericardial effusion. The thoracic aorta is normal in caliber. No atherosclerotic plaque of the thoracic aorta. No coronary artery calcifications. Mediastinum/Nodes: Stable 1 cm subcarinal lymph node. No enlarged mediastinal, hilar, or axillary lymph nodes. Trachea and esophagus demonstrate no significant findings. Tiny hiatal hernia. Subcentimeter hypodensity of the right thyroid gland-no further follow-up indicated. Lungs/Pleura: Right upper lobe and lower lobe patchy airspace opacities. Stable left lower lobe 8 x 7 mm subpleural nodule (4:50). No pulmonary  mass. No pleural effusion. No pneumothorax. Musculoskeletal: No chest wall abnormality. Chronic diffuse axial and appendicular sclerotic osseous lesions. No acute displaced fracture. Multilevel degenerative changes of the spine. CT ABDOMEN PELVIS FINDINGS Hepatobiliary: Vague hypodensity along the falciform ligament likely focal fatty infiltration. No gallstones, gallbladder wall thickening, or pericholecystic fluid. No biliary dilatation. Pancreas: No focal lesion. Normal pancreatic contour. No surrounding inflammatory changes. No main pancreatic ductal dilatation. Spleen: Normal in size. Slightly more conspicuous splenic hypodensities measuring of 2 cm (2:53). Adrenals/Urinary Tract: Stable left adrenal gland nodules measuring of to 2.4 x 1.2 cm. Stable right adrenal gland nodules measuring up to 4.6 x 1.6 cm. Bilateral kidneys enhance symmetrically. No hydronephrosis. No hydroureter. The urinary bladder is unremarkable. Stomach/Bowel: Stomach is within normal limits. No evidence of bowel wall  thickening or dilatation. Appendix appears normal. Vascular/Lymphatic: No abdominal aorta or iliac aneurysm. Mild atherosclerotic plaque of the aorta and its branches. No abdominal, pelvic, or inguinal lymphadenopathy. Reproductive: Uterus and bilateral adnexa are unremarkable. Other: No intraperitoneal free fluid. No intraperitoneal free gas. No organized fluid collection. Musculoskeletal: No abdominal wall hernia or abnormality. Chronic diffuse axial and appendicular sclerotic osseous lesions. No acute displaced fracture. Six lumbar vertebral bodies with sacralization of the L6 level. Grade 1 anterolisthesis L4 on L5. Mild retrolisthesis of L1 on L2. Bilateral intramedullary nail fixation of the femurs. Persistent periprosthetic lucency along the right femoral shaft surgical hardware. IMPRESSION: 1. Multifocal right lung pneumonia versus aspiration pneumonia. Underlying malignancy not fully excluded. 2. Stable left lower lobe 8 x 7 mm subpleural nodule. 3. Stable indeterminate bilateral adrenal gland nodules. 4. Stable indeterminate splenic hypodensities. 5. Chronic diffuse axial and appendicular sclerotic osseous metastases. Electronically Signed   By: Tish Frederickson M.D.   On: 07/05/2023 02:23   CT HEAD WO CONTRAST ( )  Result Date: 07/05/2023 CLINICAL DATA:  Mental status change, unknown cause Pt discharged yesterday after rod placement in L leg - pt altered since d/c yesterday. EXAM: CT HEAD WITHOUT CONTRAST TECHNIQUE: Contiguous axial images were obtained from the base of the skull through the vertex without intravenous contrast. RADIATION DOSE REDUCTION: This exam was performed according to the departmental dose-optimization program which includes automated exposure control, adjustment of the mA and/or kV according to patient size and/or use of iterative reconstruction technique. COMPARISON:  CT head 09/16/2022 FINDINGS: Brain: No evidence of large-territorial acute infarction. No definite  parenchymal hemorrhage. No mass lesion. No definite extra-axial collection. A 3 mm hyperdensity in the region of the posterior horn of the right lateral ventricle. Question vague 4 mm hyperdensity versus artifact within the left frontal lobe (3:14). No mass effect or midline shift. No hydrocephalus. Basilar cisterns are patent. Vascular: No hyperdense vessel. Skull: No acute fracture or focal lesion. Sinuses/Orbits: Paranasal sinuses and mastoid air cells are clear. The orbits are unremarkable. Other: None. IMPRESSION: 1. A 3 mm hyperdensity in the region of the posterior horn of the right lateral ventricle. Indeterminate etiology with hemorrhage not excluded. Recommend follow-up CT in 4-6 hours to evaluate for stability. 2. Question vague 4 mm hyperdensity versus artifact within the left frontal lobe. Recommended attention on follow-up Electronically Signed   By: Tish Frederickson M.D.   On: 07/05/2023 02:01   DG Chest 1 View  Result Date: 07/02/2023 CLINICAL DATA:  Tachycardia EXAM: CHEST  1 VIEW COMPARISON:  None Available. FINDINGS: Abnormal mediastinal silhouette, compatible with known adenopathy. New somewhat nodular opacity in the right midlung. No visible pleural effusions or pneumothorax. Polyarticular degenerative change. IMPRESSION: 1.  New somewhat nodular opacity in the right midlung which could represent progressive metastatic disease, treatment change, and/or infection. CT of the chest with contrast could further evaluate if clinically warranted 2. Abnormal mediastinal silhouette, compatible with known lymphadenopathy. Electronically Signed   By: Feliberto Harts M.D.   On: 07/02/2023 13:02   DG HIP UNILAT WITH PELVIS 2-3 VIEWS LEFT  Result Date: 06/30/2023 CLINICAL DATA:  Left hip surgery.  Intraoperative fluoroscopy. EXAM: DG HIP (WITH OR WITHOUT PELVIS) 2-3V LEFT COMPARISON:  Left femur radiographs 04/20/2023 FINDINGS: Images were performed intraoperatively without the presence of a  radiologist. Interval left femoral long intramedullary nail fixation, spanning the multiple previously seen sclerotic left femoral bone diaphyses. No hardware complication is seen. Total fluoroscopy images: 4 Total fluoroscopy time: 120 seconds Total dose: Radiation Exposure Index (as provided by the fluoroscopic device): 20.73 mGy air Kerma Please see intraoperative findings for further detail. IMPRESSION: Intraoperative fluoroscopy for left femoral long intramedullary nail fixation. Electronically Signed   By: Neita Garnet M.D.   On: 06/30/2023 16:44   DG C-Arm 1-60 Min-No Report  Result Date: 06/30/2023 Fluoroscopy was utilized by the requesting physician.  No radiographic interpretation.   DG C-Arm 1-60 Min-No Report  Result Date: 06/30/2023 Fluoroscopy was utilized by the requesting physician.  No radiographic interpretation.    PERFORMANCE STATUS (ECOG) : 2 - Symptomatic, <50% confined to bed  Review of Systems Unless otherwise noted, a complete review of systems is negative.  Physical Exam General: NAD Cardiovascular: regular rate and rhythm Pulmonary: clear ant fields Abdomen: soft, nontender, + bowel sounds GU: no suprapubic tenderness Extremities: no edema, no joint deformities Skin: no rashes Neurological: Weakness but otherwise nonfocal  IMPRESSION: Patient seen in clinic yesterday and found to have severe hypotension and altered mental status and was subsequently admitted for same.  Today, patient says that she is feeling much improved.  She did recognize me and seems to be essentially back to her cognitive baseline.  Patient denies significant symptomatic complaints.  She does have constipation and note plan for lactulose today.  Husband is not at bedside.  Discussed with Dr. Smith Robert and plan will be for outpatient follow-up or we can further discuss goals.  PLAN: -Continue current scope of treatment -Continue fentanyl/hydromorphone -Daily bowel regimen -Will  follow-up outpatient  Case and plan discussed with Dr. Smith Robert   Time Total: 30 minutes  Visit consisted of counseling and education dealing with the complex and emotionally intense issues of symptom management and palliative care in the setting of serious and potentially life-threatening illness.Greater than 50%  of this time was spent counseling and coordinating care related to the above assessment and plan.  Signed by: Laurette Schimke, PhD, NP-C

## 2023-07-20 NOTE — Assessment & Plan Note (Addendum)
Resolved.  Patient had diarrhea today.  Make lactulose as needed.

## 2023-07-20 NOTE — Progress Notes (Signed)
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  She also has essential hypertension hyperlipidemia presented with altered mental status.  She was sent into the hospital with low blood pressure.  11/6.  Patient feeling better today.  Told me that she has not had a bowel movement in 3 weeks.  Complains of a lot of pain in her legs.  Patient is a little forgetful.    Assessment and Plan: * Acute metabolic encephalopathy Continue pain medications for pain.  Patient does have some confusion.  Patient does have brain metastases.  Constipation Patient has not had a bowel movement in 3 weeks.  Will give lactulose 3 times daily until bowel movement.  Metastatic lung cancer (metastasis from lung to other site) [facility]) Patient has a history of stage IV adenocarcinoma of the lung with metastasis to the brain, bone, lymph nodes and adrenal glands.  Oncology to see today.  Hypotension Blood pressure improved from presentation.  SIRS (systemic inflammatory response syndrome) (HCC) Holding off on antibiotics since I am not quite sure what I am treating.  Leukocytosis White blood cell count down to 11.9.  Autoimmune hypophysitis (HCC) - S/p hydrocortisone 100 mg IV once - Continue oral Cortef.  Electrolyte abnormality Hyponatremia with sodium 1 point less than normal range.  Cancer related pain Continue fentanyl patch and Dilaudid  Sinus tachycardia - Hold home metoprolol for now  Goals of care, counseling/discussion Patient made DNR with intubation intervention if needed on admission        Subjective: Patient complains of a lot of pain in her legs.  Patient states that she did not have a bowel movement in about 3  weeks.  She is passing gas.  No nausea vomiting but does have a poor appetite.  Sent into the hospital for very low blood pressure yesterday.  Physical Exam: Vitals:   07/19/23 2109 07/19/23 2316 07/20/23 0359 07/20/23 0849  BP: (!) 145/97 113/89 119/88 (!) 129/97  Pulse: 83 86 91 91  Resp: 18 18 18 18   Temp: 97.6 F (36.4 C) 98.5 F (36.9 C) 97.7 F (36.5 C) 97.8 F (36.6 C)  TempSrc: Oral  Oral Oral  SpO2: 100% 100% 100% 100%  Weight:      Height:       Physical Exam HENT:     Head: Normocephalic.     Mouth/Throat:     Pharynx: No oropharyngeal exudate.  Eyes:     General: Lids are normal.     Conjunctiva/sclera: Conjunctivae normal.  Cardiovascular:     Rate and Rhythm: Normal rate and regular rhythm.     Heart sounds: Normal heart sounds, S1 normal and S2 normal.  Pulmonary:     Breath sounds: No decreased breath sounds, wheezing, rhonchi or rales.  Abdominal:     General: Bowel sounds are normal.     Palpations: Abdomen is soft.     Tenderness: There is no abdominal tenderness.  Musculoskeletal:     Right lower leg: No swelling.     Left lower leg: No swelling.  Skin:    General: Skin is warm.     Findings: No rash.  Neurological:     Mental Status: She is alert. She is confused.  Data Reviewed: Sodium 134, creatinine 0.30, white blood cell count 11.9, hemoglobin 11.2, platelet count 240, initial lactic acid 3.3  Family Communication: Husband at bedside  Disposition: Status is: Inpatient Remains inpatient appropriate because: Since patient has not had a bowel movement in 3 weeks we will work on trying to get a bowel movement going today.  Planned Discharge Destination: To be determined    Time spent: 28 minutes  Author: Alford Highland, MD 07/20/2023 12:36 PM  For on call review [email or web address].

## 2023-07-20 NOTE — Progress Notes (Signed)
Initial Nutrition Assessment  DOCUMENTATION CODES:   Not applicable  INTERVENTION:   -Ensure Enlive po TID, each supplement provides 350 kcal and 20 grams of protein.  -MVI with minerals daily -Continue dysphagia 3 diet  NUTRITION DIAGNOSIS:   Increased nutrient needs related to cancer and cancer related treatments as evidenced by estimated needs.  GOAL:   Patient will meet greater than or equal to 90% of their needs  MONITOR:   PO intake, Supplement acceptance  REASON FOR ASSESSMENT:   Consult Assessment of nutrition requirement/status, Poor PO  ASSESSMENT:   Pt with medical history significant of stage IV lung adenocarcinoma with metastasis to the brain (currently undergoing whole brain radiation), lymph nodes, adrenal glands and bone, essential hypertension, hyperlipidemia, diverticulosis, who presents due to altered mental status.  Pt admitted with SIRS and acute encephalopathy.   Reviewed I/O's: +25.1 L x 24 hours  UOP: 1.1 L x 24 hours   Oncology following for stage IV lung cancer with mets to brain, bone, lymph nodes, and adrenal glands; maintenance avastatin on hold secondary to recent lt hip recently and whole brain radiation.   Spoke with pt and husband at bedside. Pt reports feeling better today and is excited to eat coffee and eggs for breakfast. Pt husband shares that pt has had a decline in health over the past 3 weeks due to recovering from hip surgery and recovering from hospitalization from pneumonia. Pt reports she ate very little as she had a poor appetite and did not feel like eating anything. Prior to this, pt was having a good appetite, usually consuming 2-3 meals per day (pt does not follow a restricted diet).   Pt does not thinks she has lost weight. Wt stable over the past 3 months. Pt reports that she has been more weak secondary to acute illness, but hopeful that she will continue to improve (she shares she went on a beach trip with her friends 3  weeks ago).   Discussed importance of good meal and supplement intake to promote healing. Pt has tried Boost at home and is amenable to Ensure here. Discussed different supplements available in retail stores and parameters for choosing nutrient dense supplements.   Medications reviewed and include decadron, lovenox, and keppra.  Lab Results  Component Value Date   HGBA1C 5.7 (H) 03/25/2017   PTA DM medications are .   Labs reviewed: Na: 134, CBGS: 113 (inpatient orders for glycemic control are none).    NUTRITION - FOCUSED PHYSICAL EXAM:  Flowsheet Row Most Recent Value  Orbital Region No depletion  Upper Arm Region Mild depletion  Thoracic and Lumbar Region No depletion  Buccal Region No depletion  Temple Region No depletion  Clavicle Bone Region Mild depletion  Clavicle and Acromion Bone Region Mild depletion  Scapular Bone Region Mild depletion  Dorsal Hand Mild depletion  Patellar Region No depletion  Anterior Thigh Region No depletion  Posterior Calf Region No depletion  Edema (RD Assessment) None  Hair Reviewed  Eyes Reviewed  Mouth Reviewed  Skin Reviewed  Nails Reviewed       Diet Order:   Diet Order             DIET DYS 3 Room service appropriate? Yes; Fluid consistency: Thin  Diet effective now                   EDUCATION NEEDS:   Education needs have been addressed  Skin:  Skin Assessment: Reviewed RN Assessment  Last  BM:  Uknown  Height:   Ht Readings from Last 1 Encounters:  07/19/23 5\' 6"  (1.676 m)    Weight:   Wt Readings from Last 1 Encounters:  07/19/23 81 kg    Ideal Body Weight:  59.1 kg  BMI:  Body mass index is 28.82 kg/m.  Estimated Nutritional Needs:   Kcal:  1750-1950  Protein:  90-105 grams  Fluid:  > 1.7 L    Levada Schilling, RD, LDN, CDCES Registered Dietitian III Certified Diabetes Care and Education Specialist Please refer to [facility] for RD and/or RD on-call/weekend/after hours pager

## 2023-07-20 NOTE — Assessment & Plan Note (Addendum)
Resolved from admission.

## 2023-07-20 NOTE — Assessment & Plan Note (Addendum)
Continue pain medications for pain.  Patient does have some confusion but is able to follow commands and communicate.  Patient does have brain metastases.  Decrease Decadron dose to daily.

## 2023-07-20 NOTE — Hospital Course (Addendum)
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  She also has essential hypertension hyperlipidemia presented with altered mental status.  She was sent into the hospital with low blood pressure.  11/6.  Patient feeling better today.  Told me that she has not had a bowel movement in 3 weeks.  Complains of a lot of pain in her legs.  Patient is a little forgetful. 11/7.  Patient had a bowel movement yesterday.  Continue lactulose for now.  Patient agreeable to work with physical therapy.  Still having leg pains. 11/8.  Patient did not eat very well today.  Will give a fluid bolus.  Awaiting for hospital bed to be delivered home. 11/9.  Hospital bed delivered last night.  Patient interested in going home.  Encouraged to eat.  Case discussed with husband about following up with palliative care and oncology as outpatient and discussing plan.

## 2023-07-20 NOTE — Plan of Care (Signed)

## 2023-07-20 NOTE — TOC Initial Note (Signed)
Transition of Care [facility]) - Initial/Assessment Note    Patient Details  Name: Gina Sanchez MRN: [id number] Date of Birth: [birth year]  Transition of Care [facility]) CM/SW Contact:    Truddie Hidden, RN Phone Number: 07/20/2023, 3:51 PM  Clinical Narrative:                 RA completed 10/23.           Patient Goals and CMS Choice            Expected Discharge Plan and Services                                              Prior Living Arrangements/Services                       Activities of Daily Living   ADL Screening (condition at time of admission) Independently performs ADLs?: No Does the patient have a NEW difficulty with bathing/dressing/toileting/self-feeding that is expected to last >3 days?: No Does the patient have a NEW difficulty with getting in/out of bed, walking, or climbing stairs that is expected to last >3 days?: No Does the patient have a NEW difficulty with communication that is expected to last >3 days?: No Is the patient deaf or have difficulty hearing?: No Does the patient have difficulty seeing, even when wearing glasses/contacts?: No Does the patient have difficulty concentrating, remembering, or making decisions?: No  Permission Sought/Granted                  Emotional Assessment              Admission diagnosis:  SIRS (systemic inflammatory response syndrome) (HCC) [R65.10] Altered mental status, unspecified altered mental status type [R41.82] Sepsis, due to unspecified organism, unspecified whether acute organ dysfunction present [facility]) [A41.9] Patient Active Problem List   Diagnosis Date Noted   Acute metabolic encephalopathy 07/20/2023   Hypotension 07/20/2023   Constipation 07/20/2023   SIRS (systemic inflammatory response syndrome) (HCC) 07/19/2023   Electrolyte abnormality 07/19/2023   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   Palliative care encounter 07/08/2023   Multifocal pneumonia  07/08/2023   Sepsis due to pneumonia (HCC) 07/05/2023   Sepsis (HCC) 07/05/2023   Pathologic fracture of part of femur (HCC) 06/30/2023   Cancer related pain 03/10/2023   Poor appetite 03/10/2023   Sinus tachycardia 02/10/2023   Secondary adenocarcinoma of lung (HCC)    Indeterminate colitis    Closed fracture of distal end of left radius 03/23/2021   Metastatic lung cancer (metastasis from lung to other site) (HCC) 10/19/2020   Goals of care, counseling/discussion 10/19/2020   Pathologic fracture    Mediastinal adenopathy    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 [place] [address] Phone: [phone] Fax: [phone]  [facility] -  [place], [place] - [address] [place] [address] Phone: [phone] Fax: [phone]  [facility] - [place], [place] - [address] Renee [place]er [place] [address] Phone: [phone] Fax: [phone]  [facility] - [facility] [address] [place] [address] Phone: [phone] Fax: [phone]  [facility] - [place], [place] - [address] [place] [address] Phone: [phone] Fax: [phone]     Social Determinants of Health (SDOH) Social History: SDOH Screenings   Food Insecurity: No Food Insecurity (07/19/2023)   Housing: Low Risk  (07/19/2023)  Transportation Needs: No Transportation Needs (07/19/2023)  Utilities: Not At Risk (07/19/2023)  Tobacco Use: Medium Risk (07/19/2023)   SDOH Interventions:     Readmission Risk Interventions    07/06/2023    9:25 AM 07/02/2023   12:51 PM  Readmission Risk Prevention Plan  Transportation Screening Complete Complete  PCP or Specialist Appt within 5-7 Days  Complete  PCP or Specialist Appt within 3-5 Days Complete   Home Care Screening  Complete  Medication Review (RN CM)  Complete  HRI or Home Care Consult Patient refused   Social Work Consult for Recovery Care Planning/Counseling Patient refused   Palliative Care Screening Patient Refused   Medication Review Oceanographer) Complete

## 2023-07-20 NOTE — Plan of Care (Signed)
  Problem: Health Behavior/Discharge Planning: Goal: Ability to manage health-related needs will improve Outcome: Progressing   Problem: Clinical Measurements: Goal: Will remain free from infection Outcome: Progressing   Problem: Clinical Measurements: Goal: Respiratory complications will improve Outcome: Progressing   Problem: Clinical Measurements: Goal: Cardiovascular complication will be avoided Outcome: Progressing   Problem: Elimination: Goal: Will not experience complications related to bowel motility Outcome: Progressing   Problem: Elimination: Goal: Will not experience complications related to urinary retention Outcome: Progressing   Problem: Pain Management: Goal: General experience of comfort will improve Outcome: Progressing   Problem: Safety: Goal: Ability to remain free from injury will improve Outcome: Progressing

## 2023-07-21 ENCOUNTER — Other Ambulatory Visit: Payer: Self-pay

## 2023-07-21 ENCOUNTER — Inpatient Hospital Stay: Payer: 59

## 2023-07-21 ENCOUNTER — Ambulatory Visit: Payer: Self-pay

## 2023-07-21 DIAGNOSIS — I9589 Other hypotension: Secondary | ICD-10-CM | POA: Diagnosis not present

## 2023-07-21 DIAGNOSIS — C3490 Malignant neoplasm of unspecified part of unspecified bronchus or lung: Secondary | ICD-10-CM | POA: Diagnosis not present

## 2023-07-21 DIAGNOSIS — K5909 Other constipation: Secondary | ICD-10-CM

## 2023-07-21 DIAGNOSIS — G9341 Metabolic encephalopathy: Secondary | ICD-10-CM | POA: Diagnosis not present

## 2023-07-21 DIAGNOSIS — C7951 Secondary malignant neoplasm of bone: Secondary | ICD-10-CM | POA: Diagnosis not present

## 2023-07-21 LAB — RAD ONC ARIA SESSION SUMMARY
Course Elapsed Days: 10
Plan Fractions Treated to Date: 6
Plan Prescribed Dose Per Fraction: 3 Gy
Plan Total Fractions Prescribed: 10
Plan Total Prescribed Dose: 30 Gy
Reference Point Dosage Given to Date: 18 Gy
Reference Point Session Dosage Given: 3 Gy
Session Number: 6

## 2023-07-21 MED ORDER — METOPROLOL SUCCINATE ER 25 MG PO TB24
25.0000 mg | ORAL_TABLET | Freq: Every day | ORAL | Status: DC
  Administered 2023-07-21: 25 mg via ORAL
  Filled 2023-07-21: qty 1

## 2023-07-21 MED ORDER — LACTULOSE 10 GM/15ML PO SOLN
30.0000 g | Freq: Two times a day (BID) | ORAL | Status: DC
  Administered 2023-07-21 – 2023-07-22 (×2): 30 g via ORAL
  Filled 2023-07-21 (×2): qty 60

## 2023-07-21 NOTE — TOC Initial Note (Addendum)
Transition of Care [facility]) - Initial/Assessment Note    Patient Details  Name: Gina Sanchez MRN: [id number] Date of Birth: 1965/12/13  Transition of Care [facility]) CM/SW Contact:    Margarito Liner, LCSW Phone Number: 07/21/2023, 4:08 PM  Clinical Narrative:  CSW met with patient. Sister-in-law at bedside. CSW introduced role and explained that discharge planning would be discussed. Patient confirmed she is active with [facility] for PT. Liaison said they can add OT and RN. Patient is agreeable to this. DME recommendations for hospital bed and wheelchair. Sister-in-law asked CSW to call husband about DME. CSW called husband who is agreeable to hospital bed. No agency preference. Ordered through [employer]. He stated they already have a wheelchair and BSC at home. Husband asked for information on home [employer] system. CSW printed off information and placed in her discharge packet. Per MD, potential discharge tomorrow. No further concerns. CSW encouraged patient and her family to contact CSW as needed. CSW will continue to follow patient and her family for support and facilitate return home once stable. Husband will transport her home at discharge.                Expected Discharge Plan: Home w Home Health Services Barriers to Discharge: Continued Medical Work up   Patient Goals and CMS Choice     Choice offered to / list presented to : Patient, Spouse      Expected Discharge Plan and Services     Post Acute Care Choice: Resumption of Svcs/PTA Provider, Durable Medical Equipment Living arrangements for the past 2 months: Single Family Home                 DME Arranged: Hospital bed DME Agency: [employer] Date DME Agency Contacted: 07/21/23   Representative spoke with at DME Agency: Mickeal Needy HH Arranged: RN, PT, OT [facility] Agency: [facility] Date [facility] Agency Contacted: 07/21/23   Representative spoke with at [facility] Agency: Gearldine Bienenstock  Prior Living  Arrangements/Services Living arrangements for the past 2 months: Single Family Home Lives with:: Spouse Patient language and need for interpreter reviewed:: Yes Do you feel safe going back to the place where you live?: Yes      Need for Family Participation in Patient Care: Yes (Comment) Care giver support system in place?: Yes (comment) Current home services: DME, Home PT Criminal Activity/Legal Involvement Pertinent to Current Situation/Hospitalization: No - Comment as needed  Activities of Daily Living   ADL Screening (condition at time of admission) Independently performs ADLs?: No Does the patient have a NEW difficulty with bathing/dressing/toileting/self-feeding that is expected to last >3 days?: No Does the patient have a NEW difficulty with getting in/out of bed, walking, or climbing stairs that is expected to last >3 days?: No Does the patient have a NEW difficulty with communication that is expected to last >3 days?: No Is the patient deaf or have difficulty hearing?: No Does the patient have difficulty seeing, even when wearing glasses/contacts?: No Does the patient have difficulty concentrating, remembering, or making decisions?: No  Permission Sought/Granted Permission sought to share information with : Facility Medical sales representative, Family Supports Permission granted to share information with : Yes, Verbal Permission Granted  Share Information with NAME: Gene Moize  Permission granted to share info w AGENCY: [facility]  Permission granted to share info w Relationship: Husband  Permission granted to share info w Contact Information: [phone]  Emotional Assessment Appearance:: Appears stated age Attitude/Demeanor/Rapport: Engaged, Gracious Affect (typically observed):  Accepting, Appropriate, Calm, Pleasant Orientation: : Oriented to Self, Oriented to Place Alcohol / Substance Use: Not Applicable Psych Involvement: No (comment)  Admission diagnosis:   SIRS (systemic inflammatory response syndrome) (HCC) [R65.10] Altered mental status, unspecified altered mental status type [R41.82] Sepsis, due to unspecified organism, unspecified whether acute organ dysfunction present [facility]) [A41.9] Patient Active Problem List   Diagnosis Date Noted   Acute metabolic encephalopathy 07/20/2023   Hypotension 07/20/2023   Constipation 07/20/2023   SIRS (systemic inflammatory response syndrome) (HCC) 07/19/2023   Electrolyte abnormality 07/19/2023   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   Palliative care encounter 07/08/2023   Multifocal pneumonia 07/08/2023   Sepsis due to pneumonia (HCC) 07/05/2023   Sepsis (HCC) 07/05/2023   Pathologic fracture of part of femur (HCC) 06/30/2023   Cancer related pain 03/10/2023   Poor appetite 03/10/2023   Sinus tachycardia 02/10/2023   Secondary adenocarcinoma of lung (HCC)    Indeterminate colitis    Closed fracture of distal end of left radius 03/23/2021   Metastatic lung cancer (metastasis from lung to other site) [facility]) 10/19/2020   Goals of care, counseling/discussion 10/19/2020   Pathologic fracture    Mediastinal adenopathy    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) 09/15/2020   Closed fracture of right femur, unspecified fracture morphology, initial encounter (HCC) 09/14/2020   Right femoral shaft fracture (HCC) 09/14/2020   Right hip pain 09/14/2020   Leukocytosis 09/14/2020   GAD (generalized anxiety disorder) 09/14/2020   Lumbar radiculopathy 08/06/2020   Tobacco use 03/29/2017   Unstable angina (HCC) 03/25/2017   Essential hypertension    Hyperlipidemia    PCP:  Armando Gang, FNP Pharmacy:   [facility] - [place], [place] - [address] [place] [address] Phone: [phone] Fax: [phone]  [facility] - [place], [place] - [address] [place] [address] Phone:  [phone] Fax: [phone]  [facility] - [place], [place] - [address] [place] [address] Phone: [phone] Fax: [phone]  [facility] - [facility] [address] [place] [address] Phone: [phone] Fax: [phone]  [facility] - [place], [place] - [address] [place] [address] Phone: [phone] Fax: [phone]     Social Determinants of Health (SDOH) Social History: SDOH Screenings   Food Insecurity: No Food Insecurity (07/19/2023)  Housing: Low Risk  (07/19/2023)  Transportation Needs: No Transportation Needs (07/19/2023)  Utilities: Not At Risk (07/19/2023)  Tobacco Use: Medium Risk (07/19/2023)   SDOH Interventions:     Readmission Risk Interventions    07/06/2023    9:25 AM 07/02/2023   12:51 PM  Readmission Risk Prevention Plan  Transportation Screening Complete Complete  PCP or Specialist Appt within 5-7 Days  Complete  PCP or Specialist Appt within 3-5 Days Complete   Home Care Screening  Complete  Medication Review (RN CM)  Complete  HRI or Home Care Consult Patient refused   Social Work Consult for Recovery Care Planning/Counseling Patient refused   Palliative Care Screening Patient Refused   Medication Review Oceanographer) Complete

## 2023-07-21 NOTE — Evaluation (Signed)
Physical Therapy Evaluation Patient Details Name: Gina Sanchez MRN: [id number] DOB: 06-19-1966 Today's Date: 07/21/2023  History of Present Illness  Pt is a 57 y.o. 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  She also has essential hypertension hyperlipidemia presented with altered mental status.  She was sent into the hospital with low blood pressure.  Clinical Impression  Pt is received in bed with family/friends at bedside, she is agreeable to PT session. At baseline Pt reports amb with use of RW although increased difficulty recently and requires assist with ADL/IADLs especially recently. Pt is flat in affect and requires increased response time to one command/questions-Pt family/friends reports this impairment may be a cause of medication. Pt performs bed mobility CGA-minA and transfers CGA for safety. Pt requires minA for initiation of bed mobility and hand placement but able to progress to CGA. Additionally, Pt able to perform step pivot transfer from bed>recliner with use of RW CGA and intermittent assist from PT for AD advancement/management secondary to slow processing or difficulty sequencing during mobility. Pt would benefit from skilled PT to address above deficits and promote optimal return to PLOF.       If plan is discharge home, recommend the following: Assist for transportation;Help with stairs or ramp for entrance;A little help with walking and/or transfers;A little help with bathing/dressing/bathroom;Assistance with cooking/housework   Can travel by private vehicle   Yes    Equipment Recommendations Hospital bed  Recommendations for Other Services       Functional Status Assessment Patient has had a recent decline in their functional status and demonstrates the ability to make significant improvements in function in a reasonable and predictable amount of time.     Precautions  / Restrictions Precautions Precautions: Fall Restrictions Weight Bearing Restrictions: No      Mobility  Bed Mobility Overal bed mobility: Needs Assistance Bed Mobility: Supine to Sit     Supine to sit: Min assist, HOB elevated, Used rails, Contact guard     General bed mobility comments: Pt able to perform bed mobility primarily CGA but requires minA for initial mobility and frequent cuing for use of railings and hand placement; increased time required    Transfers Overall transfer level: Needs assistance Equipment used: Rolling walker (2 wheels) Transfers: Bed to chair/wheelchair/BSC     Step pivot transfers: Contact guard assist       General transfer comment: Pt performs step pivot transfer from bed>recliner CGA with use of RW; Pt requires assist with AD advancement secondary to delayed response/difficulty with sequencing    Ambulation/Gait               General Gait Details: Deferred at this time secondary to generalized weakness and reports of pain  Stairs            Wheelchair Mobility     Tilt Bed    Modified Rankin (Stroke Patients Only)       Balance Overall balance assessment: Needs assistance Sitting-balance support: Single extremity supported, Feet supported Sitting balance-Leahy Scale: Fair Sitting balance - Comments: Pt able to maintain static seated EOB balance during functional activities but requires 1 UE support during changing of gown   Standing balance support: During functional activity, Bilateral upper extremity supported Standing balance-Leahy Scale: Fair Standing balance comment: Pt able to maintain standing balance during functional activities with use of RW  Pertinent Vitals/Pain Pain Assessment Pain Assessment: 0-10 Pain Score: 6  Pain Location: BUE (L>R) and BLEs Pain Descriptors / Indicators: Aching, Sore, Discomfort, Guarding Pain Intervention(s): Limited activity within  patient's tolerance, Repositioned    Home Living Family/patient expects to be discharged to:: Private residence Living Arrangements: Spouse/significant other Available Help at Discharge: Family;Available 24 hours/day Type of Home: House Home Access: Stairs to enter Entrance Stairs-Rails: Can reach both;Left;Right Entrance Stairs-Number of Steps: 3   Home Layout: One level Home Equipment: Agricultural consultant (2 wheels);Shower seat;BSC/3in1      Prior Function Prior Level of Function : Needs assist  Cognitive Assist : ADLs (cognitive) Mobility (Cognitive): Set up cues ADLs (Cognitive): Intermittent cues Physical Assist : Mobility (physical) Mobility (physical): Transfers;Gait   Mobility Comments: Amb with use of RW ADLs Comments: More assistance required in last few days with all ADLs and IADLs than usual     Extremity/Trunk Assessment   Upper Extremity Assessment Upper Extremity Assessment: Defer to OT evaluation;Generalized weakness    Lower Extremity Assessment Lower Extremity Assessment: Generalized weakness    Cervical / Trunk Assessment Cervical / Trunk Assessment: Normal  Communication   Communication Communication: Difficulty communicating thoughts/reduced clarity of speech;Difficulty following commands/understanding Following commands: Follows one step commands with increased time Cueing Techniques: Verbal cues;Tactile cues  Cognition Arousal: Alert Behavior During Therapy: Flat affect Overall Cognitive Status: Impaired/Different from baseline Area of Impairment: Orientation, Attention, Memory, Following commands, Safety/judgement, Awareness, Problem solving                 Orientation Level: Disoriented to, Time Current Attention Level: Sustained Memory: Decreased short-term memory Following Commands: Follows one step commands with increased time Safety/Judgement: Decreased awareness of safety Awareness: Emergent Problem Solving: Slow processing,  Decreased initiation, Difficulty sequencing, Requires verbal cues General Comments: AO x3 with disorientation to time; Pt requires increased time to respond to questions, possible slow processing; Pt's family reports this delayed response is new and claim it may be caused by medication        General Comments      Exercises     Assessment/Plan    PT Assessment Patient needs continued PT services  PT Problem List Decreased strength;Decreased coordination;Decreased range of motion;Decreased activity tolerance;Decreased balance;Decreased mobility       PT Treatment Interventions DME instruction;Balance training;Gait training;Stair training;Functional mobility training;Therapeutic activities;Therapeutic exercise    PT Goals (Current goals can be found in the Care Plan section)  Acute Rehab PT Goals Patient Stated Goal: to go home PT Goal Formulation: With patient Time For Goal Achievement: 08/04/23 Potential to Achieve Goals: Fair    Frequency Min 1X/week     Co-evaluation               AM-PAC PT "6 Clicks" Mobility  Outcome Measure Help needed turning from your back to your side while in a flat bed without using bedrails?: A Little Help needed moving from lying on your back to sitting on the side of a flat bed without using bedrails?: A Little Help needed moving to and from a bed to a chair (including a wheelchair)?: A Little Help needed standing up from a chair using your arms (e.g., wheelchair or bedside chair)?: A Little Help needed to walk in hospital room?: A Lot Help needed climbing 3-5 steps with a railing? : A Lot 6 Click Score: 16    End of Session Equipment Utilized During Treatment: Gait belt Activity Tolerance: Patient limited by lethargy;Patient limited by pain Patient left: in chair;with  chair alarm set;with call bell/phone within reach;with family/visitor present Nurse Communication: Mobility status PT Visit Diagnosis: Other abnormalities of gait and  mobility (R26.89);Difficulty in walking, not elsewhere classified (R26.2);Unsteadiness on feet (R26.81);Pain;Muscle weakness (generalized) (M62.81) Pain - Right/Left: Left Pain - part of body: Leg;Shoulder (B shoulder (L>R))    Time: 1610-9604 PT Time Calculation (min) (ACUTE ONLY): 20 min   Charges:                 Elmon Else, SPT   Roch Quach 07/21/2023, 2:17 PM

## 2023-07-21 NOTE — Progress Notes (Signed)
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  She also has essential hypertension hyperlipidemia presented with altered mental status.  She was sent into the hospital with low blood pressure.  11/6.  Patient feeling better today.  Told me that she has not had a bowel movement in 3 weeks.  Complains of a lot of pain in her legs.  Patient is a little forgetful. 11/7.  Patient had a bowel movement yesterday.  Continue lactulose for now.  Patient agreeable to work with physical therapy.  Still having leg pains.   Assessment and Plan: * Acute metabolic encephalopathy Continue pain medications for pain.  Patient does have some confusion but is able to follow commands and communicate..  Patient does have brain metastases.  Constipation Patient had not had a bowel movement in 3 weeks prior to yesterday's bowel movement.  Decrease lactulose to twice a day dosing.  Metastatic lung cancer (metastasis from lung to other site) [facility]) Patient has a history of stage IV adenocarcinoma of the lung with metastasis to the brain, bone, lymph nodes and adrenal glands.  Appreciate oncology consultation.  Hypotension Blood pressure improved from presentation.  Restart lower dose Toprol since blood pressure now up.  SIRS (systemic inflammatory response syndrome) (HCC) Holding off on antibiotics since I am not quite sure what I am treating.  Leukocytosis White blood cell count down to 11.9.  Autoimmune hypophysitis (HCC) - S/p hydrocortisone 100 mg IV once - Continue oral Cortef.  Electrolyte abnormality Hyponatremia with sodium 1 point less than normal range.  Cancer related pain Continue fentanyl patch and Dilaudid  Sinus  tachycardia Restart lower dose Toprol since blood pressure improved from admission.  Goals of care, counseling/discussion Patient made DNR with intubation intervention if needed on admission        Subjective: Patient had a bowel movement yesterday.  No fever or chills.  Still having pain.  Willing to see if she can work with physical therapy.  Physical Exam: Vitals:   07/21/23 0026 07/21/23 0424 07/21/23 0801 07/21/23 1215  BP: (!) 131/96 (!) 136/103 (!) 154/107 (!) 148/111  Pulse: (!) 106 (!) 103 98 (!) 103  Resp: 18 18    Temp: 98.1 F (36.7 C) 97.8 F (36.6 C) 98.2 F (36.8 C) (!) 97.3 F (36.3 C)  TempSrc:      SpO2: 99% 100% 99% 98%  Weight:      Height:       Physical Exam HENT:     Head: Normocephalic.     Mouth/Throat:     Pharynx: No oropharyngeal exudate.  Eyes:     General: Lids are normal.     Conjunctiva/sclera: Conjunctivae normal.  Cardiovascular:     Rate and Rhythm: Normal rate and regular rhythm.     Heart sounds: Normal heart sounds, S1 normal and S2 normal.  Pulmonary:     Breath sounds: No decreased breath sounds, wheezing, rhonchi or rales.  Abdominal:     General: Bowel sounds are normal.     Palpations: Abdomen is soft.     Tenderness: There is no abdominal tenderness.  Musculoskeletal:     Right lower leg: No swelling.     Left lower leg: No swelling.  Skin:    General: Skin is warm.     Findings: No rash.  Neurological:     Mental Status: She is alert. She is confused.     Data Reviewed: Blood cultures negative for 2 days, sodium 134, creatinine 0.3, white blood cell count 11.9, hemoglobin 11.2, platelet count 240  Family Communication: Spoke with husband at the bedside  Disposition: Status is: Inpatient Remains inpatient appropriate because: Patient agreeable to work with physical therapy and Occupational Therapy.  PT no what she is capable of doing before making a decision on disposition.  Planned Discharge Destination: To  be determined    Time spent: 27 minutes  Author: Alford Highland, MD 07/21/2023 12:22 PM  For on call review [email or web address].

## 2023-07-21 NOTE — TOC CM/SW Note (Signed)
    Durable Medical Equipment  (From admission, onward)           Start     Ordered   07/21/23 1529  For home use only DME Hospital bed  Once       Question Answer Comment  Length of Need Lifetime   The above medical condition requires: Patient requires the ability to reposition frequently   Head must be elevated greater than: 45 degrees   Bed type Semi-electric   Support Surface: Gel Overlay      07/21/23 1530

## 2023-07-21 NOTE — Evaluation (Signed)
Occupational Therapy Evaluation Patient Details Name: ALEE GRESSMAN MRN: [id number] DOB: [birth year] Today's Date: 07/21/2023   History of Present Illness Pt is a 57 y.o. 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  She also has essential hypertension hyperlipidemia presented with altered mental status.  She was sent into the hospital with low blood pressure.   Clinical Impression   Pt was seen for OT evaluation this date. Prior to hospital admission, pt was requiring an increase in assistance in all ADL/IADL, specifically mentioned; dressing, bathing and amb with RW. Pt lives with her husband in one level home with 3 steps to enter. Husband states pt's cognition has improved since admitted on 11/5. Pt requires increased time to respond to questions and frequent verbal cues for sequencing and safety during session. Pt presents to acute OT demonstrating impaired ADL performance, functional mobility and activity tolerance. (See OT problem list for additional functional deficits). Pt currently requires CGA + use of bed rails for bed mobility. MAX A for LB dressing at bed level on this date. Husband states he has started assisting in all dressing tasks. Pt initially required CGA for STS at EOB with RW, in standing Pt stated feeling lightheaded, pt returned to sitting on EOB. BP in sitting: 144/104 (MAP113), HR 106 bpm. Pt completed seated oral care with set up assistance and verbal cues for initiation of task. Amb 3 lateral steps up the bed with RW + CGA, no reports lightheadedness. Pt returned to bed per her request with husband at bedside.  Pt would benefit from skilled OT services to address noted impairments and functional limitations (see below for any additional details) in order to maximize safety and independence while minimizing falls risk and caregiver burden. OT will follow acutely.       If plan is  discharge home, recommend the following: A little help with walking and/or transfers;A lot of help with bathing/dressing/bathroom;Direct supervision/assist for medications management;Supervision due to cognitive status;Direct supervision/assist for financial management;Assist for transportation;Assistance with cooking/housework;Help with stairs or ramp for entrance    Functional Status Assessment  Patient has had a recent decline in their functional status and demonstrates the ability to make significant improvements in function in a reasonable and predictable amount of time.  Equipment Recommendations  Wheelchair (measurements OT);Hospital bed    Recommendations for Other Services       Precautions / Restrictions Precautions Precautions: Fall Restrictions Weight Bearing Restrictions: No      Mobility Bed Mobility Overal bed mobility: Needs Assistance Bed Mobility: Supine to Sit, Sit to Supine     Supine to sit: Contact guard, Used rails, HOB elevated Sit to supine: Contact guard assist, HOB elevated, Used rails   General bed mobility comments: verbal cues throughout for sequencing, increased time required but no physcial assistance needed    Transfers Overall transfer level: Needs assistance Equipment used: Rolling walker (2 wheels) Transfers: Sit to/from Stand Sit to Stand: Contact guard assist           General transfer comment: Pt stated feeling lightheaded when standing, pt returned to sitting on EOB. BP in sitting: 144/104 (MAP113), HR 106 bpm. Lateral steps up the bed with RW + CGA, no reports lightheadedness      Balance Overall balance assessment: Needs assistance Sitting-balance support: Single extremity supported, Feet supported Sitting balance-Leahy Scale: Fair Sitting balance - Comments: static sitting is fair but when adjusting position pt unstable.  Standing balance support: During functional activity, Bilateral upper extremity supported Standing  balance-Leahy Scale: Fair Standing balance comment: Stood for ~30 seconds before stating feeling lightheaded                           ADL either performed or assessed with clinical judgement   ADL Overall ADL's : Needs assistance/impaired     Grooming: Oral care;Sitting;Set up;Supervision/safety;Contact guard assist Grooming Details (indicate cue type and reason): Seated on EOB, LOB noted when pt attempted to adjust her position, pt unable to react in time for self correction on this date             Lower Body Dressing: Maximal assistance;Bed level Lower Body Dressing Details (indicate cue type and reason): Donning socks                     Vision Baseline Vision/History: 0 No visual deficits       Perception Perception: Within Functional Limits       Praxis Praxis: Impaired Praxis Impairment Details: Initiation, Motor planning     Pertinent Vitals/Pain Pain Assessment Pain Assessment: Faces Faces Pain Scale: Hurts a little bit Pain Location: under chest and abdomine Pain Intervention(s): Limited activity within patient's tolerance, Monitored during session     Extremity/Trunk Assessment Upper Extremity Assessment Upper Extremity Assessment: Defer to OT evaluation;Generalized weakness   Lower Extremity Assessment Lower Extremity Assessment: Generalized weakness   Cervical / Trunk Assessment Cervical / Trunk Assessment: Normal   Communication Communication Communication: Difficulty communicating thoughts/reduced clarity of speech;Difficulty following commands/understanding Following commands: Follows one step commands with increased time Cueing Techniques: Verbal cues;Tactile cues   Cognition Arousal: Alert Behavior During Therapy: WFL for tasks assessed/performed, Flat affect Overall Cognitive Status: Impaired/Different from baseline Area of Impairment: Orientation, Attention, Memory, Following commands, Safety/judgement, Awareness,  Problem solving                 Orientation Level: Disoriented to, Time Current Attention Level: Sustained Memory: Decreased short-term memory Following Commands: Follows one step commands with increased time Safety/Judgement: Decreased awareness of safety Awareness: Emergent Problem Solving: Slow processing, Decreased initiation, Difficulty sequencing, Requires verbal cues General Comments: Alert and oriented to self, situaltion, place, ?date. Pt was impulsive with movements throughout.     General Comments       Exercises Other Exercises Other Exercises: Edu pt/husband: Role of OT, DME need for safe ADL completion at d/c, energy conservation techniques   Shoulder Instructions      Home Living Family/patient expects to be discharged to:: Private residence Living Arrangements: Spouse/significant other Available Help at Discharge: Family;Available 24 hours/day Type of Home: House Home Access: Stairs to enter [employer] of Steps: 3 Entrance Stairs-Rails: Can reach both;Left;Right Home Layout: One level     Bathroom Shower/Tub: Producer, television/film/video: Standard Bathroom Accessibility: Yes   Home Equipment: Agricultural consultant (2 wheels);Shower seat;BSC/3in1          Prior Functioning/Environment Prior Level of Function : Needs assist  Cognitive Assist : ADLs (cognitive) Mobility (Cognitive): Set up cues ADLs (Cognitive): Intermittent cues Physical Assist : Mobility (physical) Mobility (physical): Transfers;Gait   Mobility Comments: Amb with use of RW ADLs Comments: More assistance required in last few days with all ADLs and IADLs than usual        OT Problem List: Decreased strength;Decreased coordination;Decreased cognition;Decreased safety awareness;Decreased activity tolerance;Impaired balance (sitting and/or standing);Decreased knowledge of use of DME or  AE      OT Treatment/Interventions: Self-care/ADL training;Therapeutic  exercise;Energy conservation;DME and/or AE instruction;Balance training;Patient/family education;Cognitive remediation/compensation;Therapeutic activities    OT Goals(Current goals can be found in the care plan section) Acute Rehab OT Goals Patient Stated Goal: to get stronger OT Goal Formulation: With patient/family Time For Goal Achievement: 08/04/23 Potential to Achieve Goals: Good ADL Goals Pt Will Perform Grooming: standing;with supervision Pt Will Perform Lower Body Dressing: with modified independence;sit to/from stand Pt Will Transfer to Toilet: with supervision;ambulating Pt Will Perform Toileting - Clothing Manipulation and hygiene: sit to/from stand;with supervision  OT Frequency: Min 1X/week    Co-evaluation              AM-PAC OT "6 Clicks" Daily Activity     Outcome Measure Help from another person eating meals?: A Little Help from another person taking care of personal grooming?: A Lot Help from another person toileting, which includes using toliet, bedpan, or urinal?: A Lot Help from another person bathing (including washing, rinsing, drying)?: A Lot Help from another person to put on and taking off regular upper body clothing?: A Little Help from another person to put on and taking off regular lower body clothing?: A Little 6 Click Score: 15   End of Session Equipment Utilized During Treatment: Gait belt;Rolling walker (2 wheels)  Activity Tolerance: Patient tolerated treatment well Patient left: in bed;with call bell/phone within reach;with bed alarm set;with family/visitor present  OT Visit Diagnosis: Other abnormalities of gait and mobility (R26.89);Muscle weakness (generalized) (M62.81);Unsteadiness on feet (R26.81)                Time: 1610-9604 OT Time Calculation (min): 23 min Charges:  OT General Charges $OT Visit: 1 Visit OT Evaluation $OT Eval Moderate Complexity: 1 Mod  [employer], OTS

## 2023-07-22 ENCOUNTER — Ambulatory Visit: Payer: 59

## 2023-07-22 DIAGNOSIS — K5909 Other constipation: Secondary | ICD-10-CM | POA: Diagnosis not present

## 2023-07-22 DIAGNOSIS — C3490 Malignant neoplasm of unspecified part of unspecified bronchus or lung: Secondary | ICD-10-CM | POA: Diagnosis not present

## 2023-07-22 DIAGNOSIS — I10 Essential (primary) hypertension: Secondary | ICD-10-CM | POA: Diagnosis not present

## 2023-07-22 DIAGNOSIS — G9341 Metabolic encephalopathy: Secondary | ICD-10-CM | POA: Diagnosis not present

## 2023-07-22 LAB — CBC
HCT: 37.4 % (ref 36.0–46.0)
Hemoglobin: 12.3 g/dL (ref 12.0–15.0)
MCH: 28.1 pg (ref 26.0–34.0)
MCHC: 32.9 g/dL (ref 30.0–36.0)
MCV: 85.4 fL (ref 80.0–100.0)
Platelets: 204 10*3/uL (ref 150–400)
RBC: 4.38 MIL/uL (ref 3.87–5.11)
RDW: 16.5 % — ABNORMAL HIGH (ref 11.5–15.5)
WBC: 12.5 10*3/uL — ABNORMAL HIGH (ref 4.0–10.5)
nRBC: 0 % (ref 0.0–0.2)

## 2023-07-22 LAB — BASIC METABOLIC PANEL
Anion gap: 11 (ref 5–15)
BUN: 28 mg/dL — ABNORMAL HIGH (ref 6–20)
CO2: 24 mmol/L (ref 22–32)
Calcium: 8.9 mg/dL (ref 8.9–10.3)
Chloride: 99 mmol/L (ref 98–111)
Creatinine, Ser: 0.39 mg/dL — ABNORMAL LOW (ref 0.44–1.00)
GFR, Estimated: >60 mL/min (ref >60)
Glucose, Bld: 117 mg/dL — ABNORMAL HIGH (ref 70–99)
Potassium: 4.4 mmol/L (ref 3.5–5.1)
Sodium: 134 mmol/L — ABNORMAL LOW (ref 135–145)

## 2023-07-22 MED ORDER — LACTULOSE 10 GM/15ML PO SOLN: 30.0000 g | Freq: Two times a day (BID) | ORAL | Status: DC | PRN

## 2023-07-22 MED ORDER — LOSARTAN POTASSIUM 50 MG PO TABS
50.0000 mg | ORAL_TABLET | Freq: Every day | ORAL | Status: DC
Start: 2023-07-22 — End: 2023-07-23
  Administered 2023-07-22 – 2023-07-23 (×2): 50 mg via ORAL
  Filled 2023-07-22 (×2): qty 1

## 2023-07-22 MED ORDER — DEXAMETHASONE 4 MG PO TABS
4.0000 mg | ORAL_TABLET | Freq: Every day | ORAL | Status: DC
  Administered 2023-07-23: 4 mg via ORAL
  Filled 2023-07-22: qty 1

## 2023-07-22 MED ORDER — METOPROLOL SUCCINATE ER 50 MG PO TB24
50.0000 mg | ORAL_TABLET | Freq: Every day | ORAL | Status: DC
  Administered 2023-07-22 – 2023-07-23 (×2): 50 mg via ORAL
  Filled 2023-07-22 (×2): qty 1

## 2023-07-22 MED ORDER — SODIUM CHLORIDE 0.9 % IV BOLUS
500.0000 mL | Freq: Once | INTRAVENOUS | Status: AC
  Administered 2023-07-22: 500 mL via INTRAVENOUS

## 2023-07-22 NOTE — Progress Notes (Signed)
Physical Therapy Treatment Patient Details Name: Gina Sanchez MRN: [id number] DOB: 1965/11/04 Today's Date: 07/22/2023   History of Present Illness Pt is a 57 y.o. 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  She also has essential hypertension hyperlipidemia presented with altered mental status.  She was sent into the hospital with low blood pressure.    PT Comments  Pt in be don entry, husband at bedside. Pt awake and interactive, is immediately fixated on getting to BR for bowel movement. Pt requires heavy verbal/visual cues for task initiation and reorientation. MinA required to transfers close to baseline per husband. Pt able to AMB to toilet, needs occasional assist moving RW, but cognitively is frequently challenged with attending to task. Pt left in BR with husband at side for safety, NSG notified of pericare needs. Husband reports to feel pt's AMS is a bit worse today compared to previous day.    If plan is discharge home, recommend the following: Assist for transportation;Help with stairs or ramp for entrance;A little help with walking and/or transfers;A little help with bathing/dressing/bathroom;Assistance with cooking/housework   Can travel by private vehicle        Equipment Recommendations  Hospital bed;Rolling walker (2 wheels)    Recommendations for Other Services       Precautions / Restrictions Precautions Precautions: Fall Restrictions LLE Weight Bearing: Weight bearing as tolerated     Mobility  Bed Mobility Overal bed mobility: Needs Assistance Bed Mobility: Supine to Sit     Supine to sit: Supervision     General bed mobility comments: heavy cues, diffcitulty attending to task, following commands at time    Transfers Overall transfer level: Needs assistance Equipment used: Rolling walker (2 wheels) Transfers: Sit to/from Stand Sit to Stand: Min assist, From  elevated surface           General transfer comment: husband reports typically she needs assist with this    Ambulation/Gait Ambulation/Gait assistance: Min assist, Mod assist Gait Distance (Feet): 25 Feet (Left EOB to Toilet) Assistive device: Rolling walker (2 wheels)         General Gait Details: stop frequently due to cognitive focus deficits, impulsivity; requires cues for attending to task, intermittent assist with RW   Stairs             Wheelchair Mobility     Tilt Bed    Modified Rankin (Stroke Patients Only)       Balance                                            Cognition Arousal: Alert Behavior During Therapy: Flat affect, Impulsive                                            Exercises      General Comments        Pertinent Vitals/Pain Pain Assessment Pain Assessment: No/denies pain    Home Living                          Prior Function            PT Goals (current goals  can now be found in the care plan section) Acute Rehab PT Goals Patient Stated Goal: to go home PT Goal Formulation: With patient Time For Goal Achievement: 08/04/23 Potential to Achieve Goals: Fair Progress towards PT goals: Not progressing toward goals - comment    Frequency    Min 1X/week      PT Plan      Co-evaluation              AM-PAC PT "6 Clicks" Mobility   Outcome Measure  Help needed turning from your back to your side while in a flat bed without using bedrails?: A Little Help needed moving from lying on your back to sitting on the side of a flat bed without using bedrails?: A Little Help needed moving to and from a bed to a chair (including a wheelchair)?: A Lot Help needed standing up from a chair using your arms (e.g., wheelchair or bedside chair)?: A Lot Help needed to walk in hospital room?: A Lot Help needed climbing 3-5 steps with a railing? : A Lot 6 Click Score: 14     End of Session   Activity Tolerance: No increased pain Patient left: in chair;with family/visitor present;with call bell/phone within reach Nurse Communication: Mobility status PT Visit Diagnosis: Other abnormalities of gait and mobility (R26.89);Difficulty in walking, not elsewhere classified (R26.2);Unsteadiness on feet (R26.81);Pain;Muscle weakness (generalized) (M62.81)     Time: 0981-1914 PT Time Calculation (min) (ACUTE ONLY): 10 min  Charges:    $Therapeutic Activity: 8-22 mins PT General Charges $$ ACUTE PT VISIT: 1 Visit                    3:12 PM, 07/22/23 Rosamaria Lints, PT, DPT Physical Therapist - [facility]  [phone] (ASCOM)    Gina Sanchez C 07/22/2023, 3:10 PM

## 2023-07-22 NOTE — Progress Notes (Signed)
Occupational Therapy Treatment Patient Details Name: Gina Sanchez MRN: [id number] DOB: 08-27-1966 Today's Date: 07/22/2023   History of present illness Pt is a 57 y.o. 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  She also has essential hypertension hyperlipidemia presented with altered mental status.  She was sent into the hospital with low blood pressure.   OT comments  Chart reviewed, pt greeted while on toilet, agreeable to OT tx session targeting improved ADL performance. Pt husband is present throughout. Pt with large liquid BM, reporting dizziness; vitals recorded and WNL; pt performs short bouts of amb with CGA, STS with CGA-MIN A. Pt requires step by step multi modal cues for safety with husband reporting cognition is impaired as compared to evaluation yesterday. MAX A for peri care following BM. Education/discussion re: discharge recommendations, DME use for safe mobility/ADL completion at home. All question answered within scope. Pt is left in bedside chair, all needs met and safety maintained. OT will follow acutely.      If plan is discharge home, recommend the following:  A little help with walking and/or transfers;A lot of help with bathing/dressing/bathroom;Direct supervision/assist for medications management;Supervision due to cognitive status;Direct supervision/assist for financial management;Assist for transportation;Assistance with cooking/housework;Help with stairs or ramp for entrance   Equipment Recommendations  Wheelchair (measurements OT);Hospital bed;Other (comment) (2WW)    Recommendations for Other Services      Precautions / Restrictions Precautions Precautions: Fall       Mobility Bed Mobility               General bed mobility comments: NT on commode then in chair post session    Transfers Overall transfer level: Needs assistance Equipment used: Rolling  walker (2 wheels) Transfers: Sit to/from Stand, Bed to chair/wheelchair/BSC Sit to Stand: Contact guard assist     Step pivot transfers: Contact guard assist     General transfer comment: step by step multi modal cues for safety     Balance Overall balance assessment: Needs assistance Sitting-balance support: Single extremity supported, Feet supported Sitting balance-Leahy Scale: Fair     Standing balance support: During functional activity, Bilateral upper extremity supported Standing balance-Leahy Scale: Poor                             ADL either performed or assessed with clinical judgement   ADL Overall ADL's : Needs assistance/impaired Eating/Feeding: Moderate assistance                   Lower Body Dressing: Maximal assistance;Cueing for back precautions Lower Body Dressing Details (indicate cue type and reason): underwear Toilet Transfer: Minimal assistance;Rolling walker (2 wheels);Ambulation Toilet Transfer Details (indicate cue type and reason): step by step vcs for safety Toileting- Clothing Manipulation and Hygiene: Maximal assistance;Sit to/from stand;Sitting/lateral lean Toileting - Clothing Manipulation Details (indicate cue type and reason): after continent BM     Functional mobility during ADLs: Contact guard assist;Minimal assistance;Rolling walker (2 wheels);Cueing for sequencing;Cueing for safety      Extremity/Trunk Assessment              Vision       Perception     Praxis      Cognition Arousal: Alert Behavior During Therapy: Flat affect, Impulsive Overall Cognitive Status: Impaired/Different from baseline Area of Impairment: Orientation, Attention, Memory, Following commands, Safety/judgement, Awareness  Orientation Level: Disoriented to, Time, Situation Current Attention Level: Focused Memory: Decreased short-term memory Following Commands: Follows one step commands with increased  time Safety/Judgement: Decreased awareness of safety Awareness: Emergent Problem Solving: Slow processing, Decreased initiation, Difficulty sequencing, Requires verbal cues General Comments: Pt husband reports cognition is worse today as compared to yesterday        Exercises Other Exercises Other Exercises: edu husband re: use of mwc for household mobility, RW for safe transfers with gait belt; assist with ADLs    Shoulder Instructions       General Comments      Pertinent Vitals/ Pain       Pain Assessment Pain Assessment: Faces Faces Pain Scale: Hurts a little bit Pain Location: bottom after BM Pain Descriptors / Indicators: Discomfort Pain Intervention(s): Limited activity within patient's tolerance, Monitored during session, Repositioned  Home Living                                          Prior Functioning/Environment              Frequency  Min 1X/week        Progress Toward Goals  OT Goals(current goals can now be found in the care plan section)  Progress towards OT goals: Progressing toward goals     Plan      Co-evaluation                 AM-PAC OT "6 Clicks" Daily Activity     Outcome Measure   Help from another person eating meals?: A Little Help from another person taking care of personal grooming?: A Lot Help from another person toileting, which includes using toliet, bedpan, or urinal?: A Lot Help from another person bathing (including washing, rinsing, drying)?: A Lot Help from another person to put on and taking off regular upper body clothing?: A Little Help from another person to put on and taking off regular lower body clothing?: A Lot 6 Click Score: 14    End of Session Equipment Utilized During Treatment: Gait belt;Rolling walker (2 wheels)  OT Visit Diagnosis: Other abnormalities of gait and mobility (R26.89);Muscle weakness (generalized) (M62.81);Unsteadiness on feet (R26.81)   Activity Tolerance  Patient tolerated treatment well   Patient Left in chair;with call bell/phone within reach;with chair alarm set;with family/visitor present   Nurse Communication Mobility status (team re: status)        Time: 4401-0272 OT Time Calculation (min): 28 min  Charges: OT General Charges $OT Visit: 1 Visit OT Treatments $Self Care/Home Management : 23-37 mins  Oleta Mouse, OTD OTR/L  07/22/23, 2:52 PM

## 2023-07-22 NOTE — Assessment & Plan Note (Signed)
Patient had hypotension on presentation and now back on Toprol-XL 50 mg daily and losartan 50 mg daily.

## 2023-07-22 NOTE — Plan of Care (Signed)
  Problem: Education: Goal: Knowledge of General Education information will improve Description: Including pain rating scale, medication(s)/side effects and non-pharmacologic comfort measures Outcome: Progressing   Problem: Clinical Measurements: Goal: Respiratory complications will improve Outcome: Progressing   Problem: Clinical Measurements: Goal: Cardiovascular complication will be avoided Outcome: Progressing   Problem: Activity: Goal: Risk for activity intolerance will decrease Outcome: Progressing   Problem: Pain Management: Goal: General experience of comfort will improve Outcome: Progressing   Problem: Safety: Goal: Ability to remain free from injury will improve Outcome: Progressing

## 2023-07-22 NOTE — Progress Notes (Signed)
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  She also has essential hypertension hyperlipidemia presented with altered mental status.  She was sent into the hospital with low blood pressure.  11/6.  Patient feeling better today.  Told me that she has not had a bowel movement in 3 weeks.  Complains of a lot of pain in her legs.  Patient is a little forgetful. 11/7.  Patient had a bowel movement yesterday.  Continue lactulose for now.  Patient agreeable to work with physical therapy.  Still having leg pains. 11/8.  Patient did not eat very well today.  Will give a fluid bolus.  Awaiting for hospital bed to be delivered home.  Assessment and Plan: * Acute metabolic encephalopathy Continue pain medications for pain.  Patient does have some confusion but is able to follow commands and communicate.  Patient does have brain metastases.  Decrease Decadron dose to daily.  Metastatic lung cancer (metastasis from lung to other site) [facility]) Patient has a history of stage IV adenocarcinoma of the lung with metastasis to the brain, bone, lymph nodes and adrenal glands.  Appreciate oncology consultation.  Overall prognosis is poor.  Waiting for hospital bed to be delivered.  Decrease Decadron dose.  Constipation Resolved.  Patient had diarrhea today.  Make lactulose as needed.  Essential hypertension Blood pressure elevated the last couple days.  Put back on usual dose of Toprol 50 mg daily and losartan.  Leukocytosis White blood cell count down to 12.5  Autoimmune hypophysitis (HCC) Continue usual Cortef.  SIRS (systemic inflammatory response syndrome) (HCC) Resolved.  Sinus tachycardia Continue Toprol at usual  dose.  Hypotension Resolved from admission.  Electrolyte abnormality Hyponatremia with sodium 1 point less than normal range.  Cancer related pain Continue fentanyl patch and Dilaudid  Goals of care, counseling/discussion Patient made DNR with intubation intervention if needed on admission        Subjective: Patient's husband concerned about patient not eating very well today.  Patient had diarrhea today.  Lactulose made as needed.  Admitted with altered mental status.  Physical Exam: Vitals:   07/22/23 0500 07/22/23 0519 07/22/23 0725 07/22/23 1209  BP:  (!) 144/101 (!) 130/101 (!) 129/95  Pulse:  94 91 95  Resp:  18 18 18   Temp:  98.1 F (36.7 C) 98.1 F (36.7 C) 98.1 F (36.7 C)  TempSrc:  Oral Oral Oral  SpO2:  97% 98% 99%  Weight: 81.7 kg     Height:       Physical Exam HENT:     Head: Normocephalic.     Mouth/Throat:     Pharynx: No oropharyngeal exudate.  Eyes:     General: Lids are normal.     Conjunctiva/sclera: Conjunctivae normal.  Cardiovascular:     Rate and Rhythm: Normal rate and regular rhythm.     Heart sounds: Normal heart sounds, S1 normal and S2 normal.  Pulmonary:     Breath sounds: No decreased breath sounds, wheezing, rhonchi or rales.  Abdominal:     General: Bowel sounds are normal.     Palpations: Abdomen is soft.     Tenderness: There is no abdominal tenderness.  Musculoskeletal:     Right lower leg:  No swelling.     Left lower leg: No swelling.  Skin:    General: Skin is warm.     Findings: No rash.  Neurological:     Mental Status: She is alert. She is confused.     Data Reviewed: Creatinine 0.39, sodium 134, white blood cell count 12.5, hemoglobin 12.3, platelet count 204  Family Communication: Spoke with husband numerous times today.  Disposition: Status is: Inpatient Remains inpatient appropriate because: Patient did not eat very well today.  Fluid bolus ordered.  Awaiting hospital bed to be delivered home.   Planned Discharge Destination: Home with Home Health    Time spent: 27 minutes  Author: Alford Highland, MD 07/22/2023 4:33 PM  For on call review [email or web address].

## 2023-07-22 NOTE — TOC Progression Note (Addendum)
Transition of Care [facility]) - Progression Note    Patient Details  Name: Gina Sanchez MRN: [id number] Date of Birth: 1966/09/02  Transition of Care [facility]) CM/SW Contact  Truddie Hidden, RN Phone Number: 07/22/2023, 1:12 PM  Clinical Narrative:    Message sent to Cletis Athens from [employer] regarding status of bed. Awaiting reply.   4:00pm Per Cletis Athens [employer] was trying unsuccessful to reach  patient's spouse to obtain a payment method to secure her DME.   4:01pm  Spoke with patient's spouse, He was advised [employer] was attempting to reach him and anticipate that call.He stated he has his payment method ready.   4:01pm RW request sent to [facility]. He was advised patient's spouse was waiting to be contacted by [employer].    Expected Discharge Plan: Home w Home Health Services Barriers to Discharge: Continued Medical Work up  Expected Discharge Plan and Services     Post Acute Care Choice: Resumption of Svcs/PTA Provider, Durable Medical Equipment Living arrangements for the past 2 months: Single Family Home                 DME Arranged: Hospital bed DME Agency: [employer] Date DME Agency Contacted: 07/21/23   Representative spoke with at DME Agency: Mickeal Needy HH Arranged: RN, PT, OT [facility] Agency: [facility] Date [facility] Agency Contacted: 07/21/23   Representative spoke with at [facility] Agency: Gearldine Bienenstock   Social Determinants of Health (SDOH) Interventions SDOH Screenings   Food Insecurity: No Food Insecurity (07/19/2023)  Housing: Low Risk  (07/19/2023)  Transportation Needs: No Transportation Needs (07/19/2023)  Utilities: Not At Risk (07/19/2023)  Tobacco Use: Medium Risk (07/19/2023)    Readmission Risk Interventions    07/06/2023    9:25 AM 07/02/2023   12:51 PM  Readmission Risk Prevention Plan  Transportation Screening Complete Complete  PCP or Specialist Appt within 5-7 Days  Complete  PCP or Specialist Appt within 3-5 Days Complete   Home Care Screening  Complete  Medication Review (RN  CM)  Complete  HRI or Home Care Consult Patient refused   Social Work Consult for Recovery Care Planning/Counseling Patient refused   Palliative Care Screening Patient Refused   Medication Review [employer]) Complete

## 2023-07-23 DIAGNOSIS — G9341 Metabolic encephalopathy: Secondary | ICD-10-CM | POA: Diagnosis not present

## 2023-07-23 DIAGNOSIS — C3490 Malignant neoplasm of unspecified part of unspecified bronchus or lung: Secondary | ICD-10-CM | POA: Diagnosis not present

## 2023-07-23 DIAGNOSIS — I10 Essential (primary) hypertension: Secondary | ICD-10-CM | POA: Diagnosis not present

## 2023-07-23 DIAGNOSIS — B370 Candidal stomatitis: Secondary | ICD-10-CM | POA: Insufficient documentation

## 2023-07-23 DIAGNOSIS — K5909 Other constipation: Secondary | ICD-10-CM | POA: Diagnosis not present

## 2023-07-23 MED ORDER — FLUCONAZOLE 100 MG PO TABS: 100.0000 mg | ORAL_TABLET | Freq: Every day | ORAL | Status: DC

## 2023-07-23 MED ORDER — HEPARIN SOD (PORK) LOCK FLUSH 100 UNIT/ML IV SOLN
500.0000 [IU] | INTRAVENOUS | Status: DC | PRN
  Filled 2023-07-23: qty 5

## 2023-07-23 MED ORDER — ENSURE ENLIVE PO LIQD: 237.0000 mL | Freq: Three times a day (TID) | ORAL | 0 refills | Status: DC

## 2023-07-23 MED ORDER — LACTULOSE 10 GM/15ML PO SOLN: 30.0000 g | Freq: Two times a day (BID) | ORAL | 1 refills | Status: DC | PRN

## 2023-07-23 MED ORDER — FLUCONAZOLE 100 MG PO TABS
200.0000 mg | ORAL_TABLET | Freq: Once | ORAL | Status: AC
  Administered 2023-07-23: 200 mg via ORAL
  Filled 2023-07-23: qty 2

## 2023-07-23 MED ORDER — DEXAMETHASONE 4 MG PO TABS: 4.0000 mg | ORAL_TABLET | Freq: Every day | ORAL | Status: DC

## 2023-07-23 MED ORDER — LOSARTAN POTASSIUM 50 MG PO TABS: 50.0000 mg | ORAL_TABLET | Freq: Every day | ORAL | 0 refills | Status: DC

## 2023-07-23 MED ORDER — NYSTATIN 100000 UNIT/ML MT SUSP: 5.0000 mL | Freq: Four times a day (QID) | OROMUCOSAL | 0 refills | Status: AC

## 2023-07-23 MED ORDER — METOPROLOL SUCCINATE ER 50 MG PO TB24: 50.0000 mg | ORAL_TABLET | Freq: Every day | ORAL | 0 refills | Status: DC

## 2023-07-23 MED ORDER — FLUCONAZOLE 100 MG PO TABS: 100.0000 mg | ORAL_TABLET | Freq: Every day | ORAL | 0 refills | Status: DC

## 2023-07-23 NOTE — Discharge Instructions (Addendum)
I changed metoprolol over to Metoprolol ER (Toprol XL)

## 2023-07-23 NOTE — Discharge Summary (Signed)
Physician Discharge Summary   Patient: Gina Sanchez MRN: [id number] DOB: Apr 10, 1966  Admit date:     07/19/2023  Discharge date: 07/23/23  Discharge Physician: Alford Highland   PCP: Armando Gang, FNP   Recommendations at discharge:   Follow-up oncology and palliative care next week  Discharge Diagnoses: Principal Problem:   Acute metabolic encephalopathy Active Problems:   Metastatic lung cancer (metastasis from lung to other site) [facility])   Constipation   Essential hypertension   Leukocytosis   Autoimmune hypophysitis (HCC)   Sinus tachycardia   SIRS (systemic inflammatory response syndrome) (HCC)   Hypotension   Goals of care, counseling/discussion   Cancer related pain   Electrolyte abnormality   [facility] Course: 57 year old female past medical history of 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  She also has essential hypertension hyperlipidemia presented with altered mental status.  She was sent into the hospital with low blood pressure.  11/6.  Patient feeling better today.  Told me that she has not had a bowel movement in 3 weeks.  Complains of a lot of pain in her legs.  Patient is a little forgetful. 11/7.  Patient had a bowel movement yesterday.  Continue lactulose for now.  Patient agreeable to work with physical therapy.  Still having leg pains. 11/8.  Patient did not eat very well today.  Will give a fluid bolus.  Awaiting for hospital bed to be delivered home. 11/9.  Hospital bed delivered last night.  Patient interested in going home.  Encouraged to eat.  Case discussed with husband about following up with palliative care and oncology as outpatient and discussing plan.  Assessment and Plan: * Acute metabolic encephalopathy Continue pain medications for pain.  Patient does have some confusion but is able to follow commands and communicate.  Patient does have brain metastases.   Decrease Decadron dose to daily.  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  Appreciate oncology consultation.  Overall prognosis is poor.  Spoke with patient and husband about having a good conversation with oncology and palliative care as outpatient for treatment option and plan.  If they decide no further treatment, patient will be a good candidate for hospice.  Hospital bed delivered last night.  Constipation Resolved.  Make lactulose as needed.  Essential hypertension Patient had hypotension on presentation and now back on Toprol-XL 50 mg daily and losartan 50 mg daily.  Leukocytosis White blood cell count down to 12.5  Autoimmune hypophysitis (HCC) Continue usual Cortef.  SIRS (systemic inflammatory response syndrome) (HCC) Resolved.  Sepsis ruled out.  Urine analysis on admission was negative.  Blood cultures negative for 4 days.  Sinus tachycardia Improved with restarting Toprol.  Hypotension Resolved from admission.  Thrush Diflucan 200 mg x 1 then 100 mg daily for 13 days after that.  Electrolyte abnormality Hyponatremia with sodium 1 point less than normal range.  Encouraged eating.  Patient is drinking Ensure even though she is not eating that much.  Cancer related pain Continue fentanyl patch and Dilaudid (patient has these medications at home)  Goals of care, counseling/discussion Patient made DNR with intubation intervention if needed on admission         Consultants: Oncology and palliative care Procedures performed: None Disposition: Home health Diet recommendation:  Regular diet DISCHARGE MEDICATION: Allergies as of 07/23/2023       Reactions   Factive [gemifloxacin] Rash        Medication List     STOP taking these medications    Entyvio 108 MG/0.68ML Sopn Generic drug: Vedolizumab    HYDROcodone bit-homatropine 5-1.5 MG/5ML syrup Commonly known as: Hycodan   lidocaine 5 % Commonly known as: LIDODERM   metoprolol tartrate 50 MG tablet Commonly known as: LOPRESSOR   polyethylene glycol powder 17 GM/SCOOP powder Commonly known as: GLYCOLAX/MIRALAX       TAKE these medications    acetaminophen 500 MG tablet Commonly known as: TYLENOL Take 500 mg by mouth every 6 (six) hours as needed.   ALPRAZolam 0.5 MG tablet Commonly known as: XANAX TAKE 1 TABLET BY MOUTH DAILY AS NEEDED FOR ANXIETY.   citalopram 10 MG tablet Commonly known as: CELEXA Take 1 tablet (10 mg total) by mouth daily.   dexamethasone 4 MG tablet Commonly known as: DECADRON Take 1 tablet (4 mg total) by mouth daily. What changed: when to take this   feeding supplement Liqd Take 237 mLs by mouth 3 (three) times daily between meals.   fentaNYL 25 MCG/HR Commonly known as: DURAGESIC Place 1 patch onto the skin every 3 (three) days.   fluconazole 100 MG tablet Commonly known as: DIFLUCAN Take 1 tablet (100 mg total) by mouth daily. Start taking on: July 24, 2023   hydrocortisone 20 MG tablet Commonly known as: CORTEF Take 1 tablet (20 mg total) by mouth every morning.   hydrocortisone 10 MG tablet Commonly known as: CORTEF Take 1 tablet (10 mg total) by mouth at bedtime.   HYDROmorphone 2 MG tablet Commonly known as: Dilaudid Take 1-2 tablets (2-4 mg total) by mouth every 4 (four) hours as needed for severe pain (pain score 7-10) or moderate pain (pain score 4-6).   HYDROmorphone 2 MG tablet Commonly known as: Dilaudid Take 1-2 tablets (2-4 mg total) by mouth every 4 (four) hours as needed for severe pain (pain score 7-10).   lactulose 10 GM/15ML solution Commonly known as: CHRONULAC Take 45 mLs (30 g total) by mouth 2 (two) times daily as needed for mild constipation.   levETIRAcetam 500 MG tablet Commonly known as: KEPPRA Take 1 tablet (500 mg total) by mouth 2 (two)  times daily.   losartan 50 MG tablet Commonly known as: COZAAR Take 1 tablet (50 mg total) by mouth daily. Start taking on: July 24, 2023   metoprolol succinate 50 MG 24 hr tablet Commonly known as: TOPROL-XL Take 1 tablet (50 mg total) by mouth daily. Take with or immediately following a meal. Start taking on: July 24, 2023   nystatin 100000 UNIT/ML suspension Commonly known as: MYCOSTATIN Take 5 mLs (500,000 Units total) by mouth 4 (four) times daily for 10 days.   ondansetron 4 MG tablet Commonly known as: ZOFRAN TAKE 1 TABLET BY MOUTH EVERY 8 HOURS AS NEEDED FOR NAUSEA AND VOMITING What changed:  how much to take how to take this when to take this reasons to take this   prochlorperazine 10 MG tablet Commonly known as: COMPAZINE Take 1 tablet (10 mg total) by mouth every 6 (six) hours as needed for nausea or vomiting.               Durable Medical Equipment  (From admission, onward)           Start     Ordered   07/22/23 1601  For home use only DME Walker rolling  Once       Question Answer Comment  Walker: With 5 Inch Wheels   Patient needs a walker to treat with the following condition Unsteady gait when walking      07/22/23 1600   07/21/23 1531  For home use only DME Bedside commode  Once       Question:  Patient needs a bedside commode to treat with the following condition  Answer:  Unsteady gait when walking   07/21/23 1530   07/21/23 1530  For home use only DME lightweight manual wheelchair with seat cushion  Once       Comments: Patient suffers from metastatic cancer which impairs their ability to perform daily activities like toileting in the home.  A walker will not resolve  issue with performing activities of daily living. A wheelchair will allow patient to safely perform daily activities. Patient is not able to propel themselves in the home using a standard weight wheelchair due to general weakness and pain. Patient can self propel in the  lightweight wheelchair. Length of need Lifetime. Accessories: elevating leg rests (ELRs), wheel locks, extensions and anti-tippers.   07/21/23 1530   07/21/23 1529  For home use only DME Hospital bed  Once       Question Answer Comment  Length of Need Lifetime   The above medical condition requires: Patient requires the ability to reposition frequently   Head must be elevated greater than: 45 degrees   Bed type Semi-electric   Support Surface: Gel Overlay      07/21/23 1530            Follow-up Information     Creig Hines, MD Follow up in 5 day(s).   Specialty: Oncology Contact information: [address] [place] [address] [phone]                Discharge Exam: Ceasar Mons Weights   07/19/23 1425 07/22/23 0500 07/23/23 0435  Weight: 81 kg 81.7 kg 67.4 kg   Physical Exam HENT:     Head: Normocephalic.     Mouth/Throat:     Pharynx: No oropharyngeal exudate.  Eyes:     General: Lids are normal.     Conjunctiva/sclera: Conjunctivae normal.  Cardiovascular:     Rate and Rhythm: Normal rate and regular rhythm.     Heart sounds: Normal heart sounds, S1 normal and S2 normal.  Pulmonary:     Breath sounds: No decreased breath sounds, wheezing, rhonchi or rales.  Abdominal:     General: Bowel sounds are normal.     Palpations: Abdomen is soft.     Tenderness: There is no abdominal tenderness.  Musculoskeletal:     Right lower leg: No swelling.     Left lower leg: No swelling.  Skin:    General: Skin is warm.     Findings: No rash.  Neurological:     Mental Status: She is alert. She is confused.      Condition at discharge: fair  The results of significant diagnostics from this hospitalization (including imaging, microbiology, ancillary and laboratory) are listed below for reference.   Imaging Studies: CT ABDOMEN PELVIS W CONTRAST  Result Date: 07/19/2023 CLINICAL DATA:  Pulmonary embolism, hypertension, altered mental status EXAM: CT  ANGIOGRAPHY CHEST CT ABDOMEN AND PELVIS WITH CONTRAST TECHNIQUE: Multidetector CT imaging of the chest was performed using the standard protocol during bolus administration of intravenous contrast. Multiplanar CT image reconstructions  and MIPs were obtained to evaluate the vascular anatomy. Multidetector CT imaging of the abdomen and pelvis was performed using the standard protocol during bolus administration of intravenous contrast. RADIATION DOSE REDUCTION: This exam was performed according to the departmental dose-optimization program which includes automated exposure control, adjustment of the mA and/or kV according to patient size and/or use of iterative reconstruction technique. CONTRAST:  OMNIPAQUE IOHEXOL 350 MG/ML SOLN COMPARISON:  07/05/2023 FINDINGS: CTA CHEST FINDINGS Cardiovascular: Minimal coronary artery calcification. Global cardiac size within normal limits. No pericardial effusion. Central pulmonary arteries are adequately opacified and there is no intraluminal filling defect identified through the segmental level. Central pulmonary arteries are of normal caliber. The thoracic aorta is unremarkable. Right internal jugular chest port tip is seen within the right atrium. Mediastinum/Nodes: There is infiltrative soft tissue within the right paratracheal space, best seen on axial image # 45/4, which is stable since prior examination and is associated with narrowing of the superior vena cava in this region. This reflects the sequela of treated disease, better seen on PET CT examination of 10/02/2020. Borderline left pre-vascular lymph node is stable, nonspecific. No new pathologic thoracic adenopathy. Esophagus is unremarkable. Visualized thyroid is unremarkable. Lungs/Pleura: Previously noted focal consolidation within the a posterior right upper lobe and scattered ground-glass infiltrate within the right apex has improved in the interval in keeping with interval resolving infectious or  inflammatory process. Residual scarring within the right upper lobe and right lower lobe noted. Mild emphysema. Stable 7 mm pulmonary nodule within the subpleural left upper lobe, axial image # 50/6, indeterminate. While stable since immediate prior examination, this first appeared on prior examination of 04/19/2023 and is indeterminate. No new focal pulmonary nodules or infiltrates. No pneumothorax or pleural effusion. Central airways are widely patent. Musculoskeletal: Widespread sclerotic metastases are again seen throughout the visualized axial skeleton, grossly stable since prior examination. No pathologic fracture. Review of the MIP images confirms the above findings. CT ABDOMEN and PELVIS FINDINGS Hepatobiliary: No focal liver abnormality is seen. No gallstones, gallbladder wall thickening, or biliary dilatation. Pancreas: Unremarkable Spleen: 19 and 16 mm hypoenhancing subcapsular masses within the spleen are stable since prior examination, compatible with splenic metastases. No new intrasplenic lesions. The spleen is of normal size. Adrenals/Urinary Tract: Bilateral adrenal metastases are grossly stable measuring 2.2 x 5.3 cm on the right and 1.3 x 2.5 cm on the left. The kidneys are unremarkable save for a simple cortical cyst within the lower pole the left kidney for which no specific follow-up imaging is recommended. The bladder is unremarkable. Stomach/Bowel: Stomach is within normal limits. Appendix appears normal. No evidence of bowel wall thickening, distention, or inflammatory changes. Vascular/Lymphatic: Aortic atherosclerosis. No enlarged abdominal or pelvic lymph nodes. Reproductive: Uterus and bilateral adnexa are unremarkable. Other: No abdominal wall hernia or abnormality. No abdominopelvic ascites. Musculoskeletal: Bilateral hip ORIF has been performed. Widespread sclerotic metastases are again seen throughout the visualized axial skeleton, grossly stable since prior examination. No  pathologic fracture identified. Review of the MIP images confirms the above findings. IMPRESSION: 1. No pulmonary embolism. No acute intrathoracic pathology identified. 2. Minimal coronary artery calcification. 3. Stable infiltrative soft tissue within the right paratracheal space, associated with narrowing of the superior vena cava in this region. This reflects the sequela of treated disease, better seen on PET CT examination of 10/02/2020. 4. Stable 7 mm pulmonary nodule within the subpleural left upper lobe, indeterminate. While stable since immediate prior examination, this first appeared on prior examination of 04/19/2023  and a a isolated pulmonary metastasis is not excluded. Close observation on subsequent surveillance examination is warranted. 5. Widespread sclerotic metastases throughout the visualized axial skeleton, grossly stable since prior examination. No pathologic fracture identified. 6. Stable bilateral adrenal metastases. 7. Stable splenic metastases. Aortic Atherosclerosis (ICD10-I70.0) and Emphysema (ICD10-J43.9). Electronically Signed   By: Helyn Numbers M.D.   On: 07/19/2023 18:54   CT Angio Chest PE W and/or Wo Contrast  Result Date: 07/19/2023 CLINICAL DATA:  Pulmonary embolism, hypertension, altered mental status EXAM: CT ANGIOGRAPHY CHEST CT ABDOMEN AND PELVIS WITH CONTRAST TECHNIQUE: Multidetector CT imaging of the chest was performed using the standard protocol during bolus administration of intravenous contrast. Multiplanar CT image reconstructions and MIPs were obtained to evaluate the vascular anatomy. Multidetector CT imaging of the abdomen and pelvis was performed using the standard protocol during bolus administration of intravenous contrast. RADIATION DOSE REDUCTION: This exam was performed according to the departmental dose-optimization program which includes automated exposure control, adjustment of the mA and/or kV according to patient size and/or use of iterative  reconstruction technique. CONTRAST:  OMNIPAQUE IOHEXOL 350 MG/ML SOLN COMPARISON:  07/05/2023 FINDINGS: CTA CHEST FINDINGS Cardiovascular: Minimal coronary artery calcification. Global cardiac size within normal limits. No pericardial effusion. Central pulmonary arteries are adequately opacified and there is no intraluminal filling defect identified through the segmental level. Central pulmonary arteries are of normal caliber. The thoracic aorta is unremarkable. Right internal jugular chest port tip is seen within the right atrium. Mediastinum/Nodes: There is infiltrative soft tissue within the right paratracheal space, best seen on axial image # 45/4, which is stable since prior examination and is associated with narrowing of the superior vena cava in this region. This reflects the sequela of treated disease, better seen on PET CT examination of 10/02/2020. Borderline left pre-vascular lymph node is stable, nonspecific. No new pathologic thoracic adenopathy. Esophagus is unremarkable. Visualized thyroid is unremarkable. Lungs/Pleura: Previously noted focal consolidation within the a posterior right upper lobe and scattered ground-glass infiltrate within the right apex has improved in the interval in keeping with interval resolving infectious or inflammatory process. Residual scarring within the right upper lobe and right lower lobe noted. Mild emphysema. Stable 7 mm pulmonary nodule within the subpleural left upper lobe, axial image # 50/6, indeterminate. While stable since immediate prior examination, this first appeared on prior examination of 04/19/2023 and is indeterminate. No new focal pulmonary nodules or infiltrates. No pneumothorax or pleural effusion. Central airways are widely patent. Musculoskeletal: Widespread sclerotic metastases are again seen throughout the visualized axial skeleton, grossly stable since prior examination. No pathologic fracture. Review of the MIP images confirms the above  findings. CT ABDOMEN and PELVIS FINDINGS Hepatobiliary: No focal liver abnormality is seen. No gallstones, gallbladder wall thickening, or biliary dilatation. Pancreas: Unremarkable Spleen: 19 and 16 mm hypoenhancing subcapsular masses within the spleen are stable since prior examination, compatible with splenic metastases. No new intrasplenic lesions. The spleen is of normal size. Adrenals/Urinary Tract: Bilateral adrenal metastases are grossly stable measuring 2.2 x 5.3 cm on the right and 1.3 x 2.5 cm on the left. The kidneys are unremarkable save for a simple cortical cyst within the lower pole the left kidney for which no specific follow-up imaging is recommended. The bladder is unremarkable. Stomach/Bowel: Stomach is within normal limits. Appendix appears normal. No evidence of bowel wall thickening, distention, or inflammatory changes. Vascular/Lymphatic: Aortic atherosclerosis. No enlarged abdominal or pelvic lymph nodes. Reproductive: Uterus and bilateral adnexa are unremarkable. Other: No abdominal wall  hernia or abnormality. No abdominopelvic ascites. Musculoskeletal: Bilateral hip ORIF has been performed. Widespread sclerotic metastases are again seen throughout the visualized axial skeleton, grossly stable since prior examination. No pathologic fracture identified. Review of the MIP images confirms the above findings. IMPRESSION: 1. No pulmonary embolism. No acute intrathoracic pathology identified. 2. Minimal coronary artery calcification. 3. Stable infiltrative soft tissue within the right paratracheal space, associated with narrowing of the superior vena cava in this region. This reflects the sequela of treated disease, better seen on PET CT examination of 10/02/2020. 4. Stable 7 mm pulmonary nodule within the subpleural left upper lobe, indeterminate. While stable since immediate prior examination, this first appeared on prior examination of 04/19/2023 and a a isolated pulmonary metastasis is not  excluded. Close observation on subsequent surveillance examination is warranted. 5. Widespread sclerotic metastases throughout the visualized axial skeleton, grossly stable since prior examination. No pathologic fracture identified. 6. Stable bilateral adrenal metastases. 7. Stable splenic metastases. Aortic Atherosclerosis (ICD10-I70.0) and Emphysema (ICD10-J43.9). Electronically Signed   By: Helyn Numbers M.D.   On: 07/19/2023 18:54   DG Chest Port 1 View  Result Date: 07/19/2023 CLINICAL DATA:  Sepsis EXAM: PORTABLE CHEST 1 VIEW COMPARISON:  07/02/2023 FINDINGS: Lungs are clear. No pneumothorax or pleural effusion. Right internal jugular chest port tip seen within the right atrium. Cardiac size within normal limits. Pulmonary vascularity is normal. No acute bone abnormality. IMPRESSION: 1. No active disease. Electronically Signed   By: Helyn Numbers M.D.   On: 07/19/2023 18:25   CT Head Wo Contrast  Result Date: 07/19/2023 CLINICAL DATA:  Hypotension and altered mental status EXAM: CT HEAD WITHOUT CONTRAST TECHNIQUE: Contiguous axial images were obtained from the base of the skull through the vertex without intravenous contrast. RADIATION DOSE REDUCTION: This exam was performed according to the departmental dose-optimization program which includes automated exposure control, adjustment of the mA and/or kV according to patient size and/or use of iterative reconstruction technique. COMPARISON:  Brain MRI 07/05/2023 FINDINGS: Brain: There is no acute intracranial hemorrhage, extra-axial fluid collection, or acute infarct. Parenchymal volume is normal. The ventricles are normal in size. Gray-white differentiation is preserved. The pituitary and suprasellar region are normal. Small hyperdense lesions are seen in the left frontal subcortical white matter and right peritrigonal white matter corresponding to known metastatic lesions, better evaluated on recent brain MRI. There is no parenchymal edema or mass  effect. There is no midline shift. Vascular: There is calcification of the bilateral carotid siphons. Skull: Normal. Negative for fracture or focal lesion. Sinuses/Orbits: The imaged paranasal sinuses are clear. The globes and orbits are unremarkable. Other: The mastoid air cells and middle ear cavities are clear. IMPRESSION: 1. No acute intracranial pathology. 2. No parenchymal edema or mass effect related to the known intracranial metastatic disease which was better seen on recent brain MRI. Electronically Signed   By: Lesia Hausen M.D.   On: 07/19/2023 17:27   EEG adult  Result Date: 07/06/2023 Jefferson Fuel, MD     07/08/2023  4:13 AM Routine EEG Report AFRIN STASTNY is a 57 y.o. female with a history of seizures who is undergoing an EEG to evaluate for seizures. Report: This EEG was acquired with electrodes placed according to the International 10-20 electrode system (including Fp1, Fp2, F3, F4, C3, C4, P3, P4, O1, O2, T3, T4, T5, T6, A1, A2, Fz, Cz, Pz). The following electrodes were missing or displaced: none. The occipital dominant rhythm was 7 Hz with intermittent more pronounced diffuse  slowing. This activity is reactive to stimulation. Drowsiness was manifested by background fragmentation; deeper stages of sleep were not identified. There was no focal slowing. There were no interictal epileptiform discharges. There were no electrographic seizures identified. There was no abnormal response to photic stimulation or hyperventilation. Impression and clinical correlation: This EEG was obtained while awake and drowsy and is abnormal due to mild diffuse slowing indicative of global cerebral dysfunction. Epileptiform abnormalities were not seen during this recording. Bing Neighbors, MD [facility] [phone] If 7pm- 7am, please page neurology on call as listed in AMION.   MR BRAIN W WO CONTRAST  Result Date: 07/05/2023 CLINICAL DATA:  57 year old female altered mental status. Evidence  of progressed left frontal bone skull metastasis since 2022 on CT today, and multiple subcentimeter hypodense foci in the brain suspicious for cerebral metastases. Subsequent encounter. EXAM: MRI HEAD WITHOUT AND WITH CONTRAST TECHNIQUE: Multiplanar, multiecho pulse sequences of the brain and surrounding structures were obtained without and with intravenous contrast. CONTRAST:  7.33mL GADAVIST GADOBUTROL 1 MMOL/ML IV SOLN COMPARISON:  Head CT this morning.  Brain MRI 01/20/2021. FINDINGS: Brain: Motion artifact on postcontrast imaging despite repeated imaging attempts. There are numerable subcentimeter FLAIR and T2 hyperintense foci scattered throughout the bilateral brain (see series 9), some of the largest of which correspond to the hyperdense foci by CT this morning (including adjacent to the right occipital horn series 9, image 24 measuring 9 mm). None of these are identified on SWI with susceptibility. However, some are mildly T1 hyperintense (such as in the left anterior frontal lobe series 7, image 16). But nearly all of the lesions are conspicuous on diffusion imaging, some are restricted and some have ring like morphology (series 13, image 28). None of the lesions are very apparent on motion degraded axial postcontrast imaging. But a few of them do appear to be enhancing on coronal postcontrast imaging in the posterior left hemisphere (series 21). Furthermore, when comparing pre and postcontrast sagittal T1 weighted imaging, it is suspected that many of the small lesions do abnormally enhance (see series 22). No associated vasogenic edema. And no significant intracranial mass effect. No dural thickening or enhancement. No superimposed restricted diffusion which is strongly suggestive of acute infarction. No ventriculomegaly, extra-axial collection. Cervicomedullary junction and pituitary are within normal limits. Vascular: Major intracranial vascular flow voids are stable. Following contrast the major dural  venous sinuses are enhancing and appear to be patent. Skull and upper cervical spine: Thick left superior frontal bone metastasis was better demonstrated by CT this morning, decreased T2 signal there is new since the 2022 MRI. No destructive osseous lesion is identified. Sinuses/Orbits: Stable, negative. Other: Mastoids remain well aerated. Negative visible scalp and face. IMPRESSION: 1. Innumerable subcentimeter brain lesions which are new since 2022. Although most are difficult to identify on motion degraded post-contrast images, the constellation of MRI findings is more suggestive of brain metastases than embolic infarcts or other differential considerations. And it is possible that most of the lesions are treated metastases, with no associated vasogenic edema or mass effect. A short interval repeat MRI in 2 months may be valuable. 2. Progressed skull metastases since 2022 but no destructive osseous lesion is identified. Electronically Signed   By: Odessa Fleming M.D.   On: 07/05/2023 13:24   CT HEAD WO CONTRAST ( )  Result Date: 07/05/2023 CLINICAL DATA:  57 year old female altered mental status. Possible trace hemorrhage layering in the right occipital horn on head CT 0055 hours today., and small left anterior  frontal lobe hyperdensity also. Metastatic lung cancer. EXAM: CT HEAD WITHOUT CONTRAST TECHNIQUE: Contiguous axial images were obtained from the base of the skull through the vertex without intravenous contrast. RADIATION DOSE REDUCTION: This exam was performed according to the departmental dose-optimization program which includes automated exposure control, adjustment of the mA and/or kV according to patient size and/or use of iterative reconstruction technique. COMPARISON:  Head CT 0055 hours today.  Brain MRI 01/20/2021. FINDINGS: Brain: Unchanged small 5 mm hyperdense foci in both the left anterior frontal and right occipital lobes both visible on series 2, image 13 now. No associated edema or mass  effect by CT. The right occipital lesion now is seen to be separate from the occipital horn which is on series 2, image 15. These appear to be parenchymal. Furthermore, there is a subtle 3rd hyperdense lesion in the right anterior inferior temporal lobe sagittal image 21. And possibly additional punctate lesions such as the left frontal lobe on series 2, image 17. All of these are new since 09/16/2022. No midline shift or intracranial mass effect. No ventriculomegaly. No extra-axial blood identified. No cortically based acute infarct identified. Normal basilar cisterns. Vascular: No suspicious intracranial vascular hyperdensity. Skull: Abnormal left frontal bone appears infiltrated with abnormal sclerosis and periosteal reaction on series 3, image 51, new since 09/16/2022. This is compatible with skull metastasis. No superimposed skull fracture is identified. No lytic bone lesion is identified. Sinuses/Orbits: Visualized paranasal sinuses and mastoids are stable and well aerated. Other: Visualized orbits and scalp soft tissues are within normal limits. IMPRESSION: 1. 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 2. At least 3 subcentimeter hyperdense foci in the bilateral brain are more likely small hyperdense Brain Metastases than areas of intracranial blood. Brain MRI without and with contrast should be confirmatory and could be compared to 01/20/2021. 3. No associated intracranial mass effect.  No cerebral edema by CT. Electronically Signed   By: Odessa Fleming M.D.   On: 07/05/2023 07:54   CT CHEST ABDOMEN PELVIS W CONTRAST  Result Date: 07/05/2023 CLINICAL DATA:  Sepsis.  History of metastatic lung cancer. EXAM: CT CHEST, ABDOMEN, AND PELVIS WITH CONTRAST TECHNIQUE: Multidetector CT imaging of the chest, abdomen and pelvis was performed following the standard protocol during bolus administration of intravenous contrast. RADIATION DOSE REDUCTION: This exam was performed according to the  departmental dose-optimization program which includes automated exposure control, adjustment of the mA and/or kV according to patient size and/or use of iterative reconstruction technique. CONTRAST:  OMNIPAQUE IOHEXOL 300 MG/ML  SOLN COMPARISON:  CT chest abdomen pelvis 04/19/2023 FINDINGS: CT CHEST FINDINGS Cardiovascular: Right chest wall accessed Port-A-Cath with tip at the superior cavoatrial junction. Normal heart size. No significant pericardial effusion. The thoracic aorta is normal in caliber. No atherosclerotic plaque of the thoracic aorta. No coronary artery calcifications. Mediastinum/Nodes: Stable 1 cm subcarinal lymph node. No enlarged mediastinal, hilar, or axillary lymph nodes. Trachea and esophagus demonstrate no significant findings. Tiny hiatal hernia. Subcentimeter hypodensity of the right thyroid gland-no further follow-up indicated. Lungs/Pleura: Right upper lobe and lower lobe patchy airspace opacities. Stable left lower lobe 8 x 7 mm subpleural nodule (4:50). No pulmonary mass. No pleural effusion. No pneumothorax. Musculoskeletal: No chest wall abnormality. Chronic diffuse axial and appendicular sclerotic osseous lesions. No acute displaced fracture. Multilevel degenerative changes of the spine. CT ABDOMEN PELVIS FINDINGS Hepatobiliary: Vague hypodensity along the falciform ligament likely focal fatty infiltration. No gallstones, gallbladder wall thickening, or pericholecystic fluid. No biliary  dilatation. Pancreas: No focal lesion. Normal pancreatic contour. No surrounding inflammatory changes. No main pancreatic ductal dilatation. Spleen: Normal in size. Slightly more conspicuous splenic hypodensities measuring of 2 cm (2:53). Adrenals/Urinary Tract: Stable left adrenal gland nodules measuring of to 2.4 x 1.2 cm. Stable right adrenal gland nodules measuring up to 4.6 x 1.6 cm. Bilateral kidneys enhance symmetrically. No hydronephrosis. No hydroureter. The urinary bladder is  unremarkable. Stomach/Bowel: Stomach is within normal limits. No evidence of bowel wall thickening or dilatation. Appendix appears normal. Vascular/Lymphatic: No abdominal aorta or iliac aneurysm. Mild atherosclerotic plaque of the aorta and its branches. No abdominal, pelvic, or inguinal lymphadenopathy. Reproductive: Uterus and bilateral adnexa are unremarkable. Other: No intraperitoneal free fluid. No intraperitoneal free gas. No organized fluid collection. Musculoskeletal: No abdominal wall hernia or abnormality. Chronic diffuse axial and appendicular sclerotic osseous lesions. No acute displaced fracture. Six lumbar vertebral bodies with sacralization of the L6 level. Grade 1 anterolisthesis L4 on L5. Mild retrolisthesis of L1 on L2. Bilateral intramedullary nail fixation of the femurs. Persistent periprosthetic lucency along the right femoral shaft surgical hardware. IMPRESSION: 1. Multifocal right lung pneumonia versus aspiration pneumonia. Underlying malignancy not fully excluded. 2. Stable left lower lobe 8 x 7 mm subpleural nodule. 3. Stable indeterminate bilateral adrenal gland nodules. 4. Stable indeterminate splenic hypodensities. 5. Chronic diffuse axial and appendicular sclerotic osseous metastases. Electronically Signed   By: Tish Frederickson M.D.   On: 07/05/2023 02:23   CT HEAD WO CONTRAST ( )  Result Date: 07/05/2023 CLINICAL DATA:  Mental status change, unknown cause Pt discharged yesterday after rod placement in L leg - pt altered since d/c yesterday. EXAM: CT HEAD WITHOUT CONTRAST TECHNIQUE: Contiguous axial images were obtained from the base of the skull through the vertex without intravenous contrast. RADIATION DOSE REDUCTION: This exam was performed according to the departmental dose-optimization program which includes automated exposure control, adjustment of the mA and/or kV according to patient size and/or use of iterative reconstruction technique. COMPARISON:  CT head 09/16/2022  FINDINGS: Brain: No evidence of large-territorial acute infarction. No definite parenchymal hemorrhage. No mass lesion. No definite extra-axial collection. A 3 mm hyperdensity in the region of the posterior horn of the right lateral ventricle. Question vague 4 mm hyperdensity versus artifact within the left frontal lobe (3:14). No mass effect or midline shift. No hydrocephalus. Basilar cisterns are patent. Vascular: No hyperdense vessel. Skull: No acute fracture or focal lesion. Sinuses/Orbits: Paranasal sinuses and mastoid air cells are clear. The orbits are unremarkable. Other: None. IMPRESSION: 1. A 3 mm hyperdensity in the region of the posterior horn of the right lateral ventricle. Indeterminate etiology with hemorrhage not excluded. Recommend follow-up CT in 4-6 hours to evaluate for stability. 2. Question vague 4 mm hyperdensity versus artifact within the left frontal lobe. Recommended attention on follow-up Electronically Signed   By: Tish Frederickson M.D.   On: 07/05/2023 02:01   DG Chest 1 View  Result Date: 07/02/2023 CLINICAL DATA:  Tachycardia EXAM: CHEST  1 VIEW COMPARISON:  None Available. FINDINGS: Abnormal mediastinal silhouette, compatible with known adenopathy. New somewhat nodular opacity in the right midlung. No visible pleural effusions or pneumothorax. Polyarticular degenerative change. IMPRESSION: 1. New somewhat nodular opacity in the right midlung which could represent progressive metastatic disease, treatment change, and/or infection. CT of the chest with contrast could further evaluate if clinically warranted 2. Abnormal mediastinal silhouette, compatible with known lymphadenopathy. Electronically Signed   By: Feliberto Harts M.D.   On: 07/02/2023 13:02  DG HIP UNILAT WITH PELVIS 2-3 VIEWS LEFT  Result Date: 06/30/2023 CLINICAL DATA:  Left hip surgery.  Intraoperative fluoroscopy. EXAM: DG HIP (WITH OR WITHOUT PELVIS) 2-3V LEFT COMPARISON:  Left femur radiographs 04/20/2023  FINDINGS: Images were performed intraoperatively without the presence of a radiologist. Interval left femoral long intramedullary nail fixation, spanning the multiple previously seen sclerotic left femoral bone diaphyses. No hardware complication is seen. Total fluoroscopy images: 4 Total fluoroscopy time: 120 seconds Total dose: Radiation Exposure Index (as provided by the fluoroscopic device): 20.73 mGy air Kerma Please see intraoperative findings for further detail. IMPRESSION: Intraoperative fluoroscopy for left femoral long intramedullary nail fixation. Electronically Signed   By: Neita Garnet M.D.   On: 06/30/2023 16:44   DG C-Arm 1-60 Min-No Report  Result Date: 06/30/2023 Fluoroscopy was utilized by the requesting physician.  No radiographic interpretation.   DG C-Arm 1-60 Min-No Report  Result Date: 06/30/2023 Fluoroscopy was utilized by the requesting physician.  No radiographic interpretation.    Microbiology: Results for orders placed or performed during the hospital encounter of 07/19/23  Blood Culture (routine x 2)     Status: None (Preliminary result)   Collection Time: 07/19/23  2:45 PM   Specimen: BLOOD  Result Value Ref Range Status   Specimen Description BLOOD BLOOD LEFT ARM  Final   Special Requests   Final    BOTTLES DRAWN AEROBIC AND ANAEROBIC Blood Culture adequate volume   Culture   Final    NO GROWTH 4 DAYS Performed at [facility], [address] [place], [place] [address]    Report Status PENDING  Incomplete  Blood Culture (routine x 2)     Status: None (Preliminary result)   Collection Time: 07/19/23  3:20 PM   Specimen: BLOOD  Result Value Ref Range Status   Specimen Description BLOOD BLOOD RIGHT ARM  Final   Special Requests   Final    BOTTLES DRAWN AEROBIC AND ANAEROBIC Blood Culture adequate volume   Culture   Final    NO GROWTH 4 DAYS Performed at [facility], [address] [place], [place] [address]    Report Status  PENDING  Incomplete  Resp panel by RT-PCR (RSV, Flu A&B, Covid) Anterior Nasal Swab     Status: None   Collection Time: 07/19/23  3:20 PM   Specimen: Anterior Nasal Swab  Result Value Ref Range Status   SARS Coronavirus 2 by RT PCR NEGATIVE NEGATIVE Final    Comment: (NOTE) SARS-CoV-2 target nucleic acids are NOT DETECTED.  The SARS-CoV-2 RNA is generally detectable in upper respiratory specimens during the acute phase of infection. The lowest concentration of SARS-CoV-2 viral copies this assay can detect is 138 copies/mL. A negative result does not preclude SARS-Cov-2 infection and should not be used as the sole basis for treatment or other patient management decisions. A negative result may occur with  improper specimen collection/handling, submission of specimen other than nasopharyngeal swab, presence of viral mutation(s) within the areas targeted by this assay, and inadequate number of viral copies(<138 copies/mL). A negative result must be combined with clinical observations, patient history, and epidemiological information. The expected result is Negative.  Fact Sheet for Patients:  BloggerCourse.com  Fact Sheet for Healthcare Providers:  SeriousBroker.it  This test is no t yet approved or cleared by the [place] FDA and  has been authorized for detection and/or diagnosis of SARS-CoV-2 by FDA under an Emergency Use Authorization (EUA). This EUA will remain  in effect (  meaning this test can be used) for the duration of the COVID-19 declaration under Section 564(b)(1) of the Act, 21 U.S.C.section 360bbb-3(b)(1), unless the authorization is terminated  or revoked sooner.       Influenza A by PCR NEGATIVE NEGATIVE Final   Influenza B by PCR NEGATIVE NEGATIVE Final    Comment: (NOTE) The Xpert Xpress SARS-CoV-2/FLU/RSV plus assay is intended as an aid in the diagnosis of influenza from Nasopharyngeal swab specimens  and should not be used as a sole basis for treatment. Nasal washings and aspirates are unacceptable for Xpert Xpress SARS-CoV-2/FLU/RSV testing.  Fact Sheet for Patients: BloggerCourse.com  Fact Sheet for Healthcare Providers: SeriousBroker.it  This test is not yet approved or cleared by the [place] FDA and has been authorized for detection and/or diagnosis of SARS-CoV-2 by FDA under an Emergency Use Authorization (EUA). This EUA will remain in effect (meaning this test can be used) for the duration of the COVID-19 declaration under Section 564(b)(1) of the Act, 21 U.S.C. section 360bbb-3(b)(1), unless the authorization is terminated or revoked.     Resp Syncytial Virus by PCR NEGATIVE NEGATIVE Final    Comment: (NOTE) Fact Sheet for Patients: BloggerCourse.com  Fact Sheet for Healthcare Providers: SeriousBroker.it  This test is not yet approved or cleared by the [place] FDA and has been authorized for detection and/or diagnosis of SARS-CoV-2 by FDA under an Emergency Use Authorization (EUA). This EUA will remain in effect (meaning this test can be used) for the duration of the COVID-19 declaration under Section 564(b)(1) of the Act, 21 U.S.C. section 360bbb-3(b)(1), unless the authorization is terminated or revoked.  Performed at [facility], [address] [place], [place] [address]     Labs: CBC: Recent Labs  Lab 07/18/23 1125 07/19/23 1354 07/19/23 1445 07/20/23 0540 07/22/23 0515  WBC 15.6* 18.6* 19.1* 11.9* 12.5*  NEUTROABS 14.2* 16.3* 17.0* 10.6*  --   HGB 13.2 13.2 13.3 11.2* 12.3  HCT 40.9 40.3 40.3 33.8* 37.4  MCV 85.7 84.8 83.8 84.9 85.4  PLT 340 306 355 240 204   Basic Metabolic Panel: Recent Labs  Lab 07/18/23 1125 07/19/23 1354 07/19/23 1426 07/20/23 0540 07/22/23 0515  NA 126* 129* 129* 134* 134*  K 5.0 5.0 4.7 4.2  4.4  CL 91* 95* 96* 102 99  CO2 22 22 21* 21* 24  GLUCOSE 135* 122* 150* 104* 117*  BUN 28* 27* 28* 18 28*  CREATININE 0.65 0.58 0.59 <0.30* 0.39*  CALCIUM 8.7* 8.9 8.6* 8.3* 8.9  MG  --   --   --  2.4  --   PHOS  --   --   --  3.6  --    Liver Function Tests: Recent Labs  Lab 07/18/23 1125 07/19/23 1354 07/19/23 1426 07/20/23 0540  AST 27 38 37 28  ALT 35 40 41 35  ALKPHOS 183* 201* 219* 181*  BILITOT 1.3* 1.4* 1.6* 1.3*  PROT 7.4 7.4 7.4 6.2*  ALBUMIN 3.7 3.6 3.6 3.0*   CBG: Recent Labs  Lab 07/19/23 1511  GLUCAP 113*    Discharge time spent: greater than 30 minutes.  Signed: Alford Highland, MD [facility] 07/23/2023

## 2023-07-23 NOTE — Assessment & Plan Note (Signed)
Diflucan 200 mg x 1 then 100 mg daily for 13 days after that.

## 2023-07-24 LAB — CULTURE, BLOOD (ROUTINE X 2)
Culture: NO GROWTH
Culture: NO GROWTH
Special Requests: ADEQUATE
Special Requests: ADEQUATE

## 2023-07-25 ENCOUNTER — Telehealth: Payer: Self-pay | Admitting: *Deleted

## 2023-07-25 ENCOUNTER — Ambulatory Visit: Payer: 59

## 2023-07-25 ENCOUNTER — Inpatient Hospital Stay: Payer: 59 | Admitting: Hospice and Palliative Medicine

## 2023-07-25 DIAGNOSIS — E785 Hyperlipidemia, unspecified: Secondary | ICD-10-CM | POA: Diagnosis not present

## 2023-07-25 DIAGNOSIS — F32A Depression, unspecified: Secondary | ICD-10-CM | POA: Diagnosis not present

## 2023-07-25 DIAGNOSIS — Z87891 Personal history of nicotine dependence: Secondary | ICD-10-CM | POA: Diagnosis not present

## 2023-07-25 DIAGNOSIS — C770 Secondary and unspecified malignant neoplasm of lymph nodes of head, face and neck: Secondary | ICD-10-CM | POA: Diagnosis not present

## 2023-07-25 DIAGNOSIS — M84552D Pathological fracture in neoplastic disease, left femur, subsequent encounter for fracture with routine healing: Secondary | ICD-10-CM | POA: Diagnosis not present

## 2023-07-25 DIAGNOSIS — C3491 Malignant neoplasm of unspecified part of right bronchus or lung: Secondary | ICD-10-CM | POA: Diagnosis not present

## 2023-07-25 DIAGNOSIS — F411 Generalized anxiety disorder: Secondary | ICD-10-CM | POA: Diagnosis not present

## 2023-07-25 DIAGNOSIS — R32 Unspecified urinary incontinence: Secondary | ICD-10-CM | POA: Diagnosis not present

## 2023-07-25 DIAGNOSIS — K219 Gastro-esophageal reflux disease without esophagitis: Secondary | ICD-10-CM | POA: Diagnosis not present

## 2023-07-25 DIAGNOSIS — K5790 Diverticulosis of intestine, part unspecified, without perforation or abscess without bleeding: Secondary | ICD-10-CM | POA: Diagnosis not present

## 2023-07-25 DIAGNOSIS — C7951 Secondary malignant neoplasm of bone: Secondary | ICD-10-CM | POA: Diagnosis not present

## 2023-07-25 DIAGNOSIS — K5909 Other constipation: Secondary | ICD-10-CM | POA: Diagnosis not present

## 2023-07-25 DIAGNOSIS — Z8701 Personal history of pneumonia (recurrent): Secondary | ICD-10-CM | POA: Diagnosis not present

## 2023-07-25 DIAGNOSIS — K523 Indeterminate colitis: Secondary | ICD-10-CM | POA: Diagnosis not present

## 2023-07-25 DIAGNOSIS — M5416 Radiculopathy, lumbar region: Secondary | ICD-10-CM | POA: Diagnosis not present

## 2023-07-25 DIAGNOSIS — I10 Essential (primary) hypertension: Secondary | ICD-10-CM | POA: Diagnosis not present

## 2023-07-25 DIAGNOSIS — Z860100 Personal history of colon polyps, unspecified: Secondary | ICD-10-CM | POA: Diagnosis not present

## 2023-07-25 DIAGNOSIS — G893 Neoplasm related pain (acute) (chronic): Secondary | ICD-10-CM | POA: Diagnosis not present

## 2023-07-25 DIAGNOSIS — Z604 Social exclusion and rejection: Secondary | ICD-10-CM | POA: Diagnosis not present

## 2023-07-25 DIAGNOSIS — C7970 Secondary malignant neoplasm of unspecified adrenal gland: Secondary | ICD-10-CM | POA: Diagnosis not present

## 2023-07-25 DIAGNOSIS — Z556 Problems related to health literacy: Secondary | ICD-10-CM | POA: Diagnosis not present

## 2023-07-25 NOTE — Telephone Encounter (Signed)
S/o called reporting that patient states that she could not come today because she needs more energy first. He says that home health nurse is supposed to be at home at 230 today and that he wil get her in tomorrow to see you. I told him that she needs an appointment for tat so please call him back with an appointment for tomorrow. I notified radiation therapy that she is opt coming today

## 2023-07-25 NOTE — Telephone Encounter (Signed)
Msg sent to scheduling to change apt to tomorrow.

## 2023-07-26 ENCOUNTER — Inpatient Hospital Stay: Payer: 59

## 2023-07-26 ENCOUNTER — Inpatient Hospital Stay (HOSPITAL_BASED_OUTPATIENT_CLINIC_OR_DEPARTMENT_OTHER): Payer: 59 | Admitting: Hospice and Palliative Medicine

## 2023-07-26 ENCOUNTER — Other Ambulatory Visit: Payer: Self-pay

## 2023-07-26 ENCOUNTER — Encounter: Payer: Self-pay | Admitting: Hospice and Palliative Medicine

## 2023-07-26 ENCOUNTER — Other Ambulatory Visit: Payer: Self-pay | Admitting: *Deleted

## 2023-07-26 ENCOUNTER — Ambulatory Visit
Admission: RE | Admit: 2023-07-26 | Discharge: 2023-07-26 | Disposition: A | Discharge status: Home / Self Care | Payer: 59 | Source: Ambulatory Visit | Attending: Radiation Oncology | Admitting: Radiation Oncology

## 2023-07-26 ENCOUNTER — Ambulatory Visit: Payer: 59

## 2023-07-26 VITALS — BP 90/56 | HR 100 | Temp 93.6°F | Resp 18

## 2023-07-26 DIAGNOSIS — E860 Dehydration: Secondary | ICD-10-CM

## 2023-07-26 DIAGNOSIS — C3491 Malignant neoplasm of unspecified part of right bronchus or lung: Secondary | ICD-10-CM

## 2023-07-26 DIAGNOSIS — G893 Neoplasm related pain (acute) (chronic): Secondary | ICD-10-CM

## 2023-07-26 DIAGNOSIS — C7951 Secondary malignant neoplasm of bone: Secondary | ICD-10-CM | POA: Diagnosis not present

## 2023-07-26 LAB — RAD ONC ARIA SESSION SUMMARY
Course Elapsed Days: 15
Plan Fractions Treated to Date: 7
Plan Prescribed Dose Per Fraction: 3 Gy
Plan Total Fractions Prescribed: 10
Plan Total Prescribed Dose: 30 Gy
Reference Point Dosage Given to Date: 21 Gy
Reference Point Session Dosage Given: 3 Gy
Session Number: 7

## 2023-07-26 LAB — CMP (CANCER CENTER ONLY)
ALT: 44 U/L (ref 0–44)
AST: 28 U/L (ref 15–41)
Albumin: 3 g/dL — ABNORMAL LOW (ref 3.5–5.0)
Alkaline Phosphatase: 266 U/L — ABNORMAL HIGH (ref 38–126)
Anion gap: 13 (ref 5–15)
BUN: 31 mg/dL — ABNORMAL HIGH (ref 6–20)
CO2: 23 mmol/L (ref 22–32)
Calcium: 8.6 mg/dL — ABNORMAL LOW (ref 8.9–10.3)
Chloride: 89 mmol/L — ABNORMAL LOW (ref 98–111)
Creatinine: 0.52 mg/dL (ref 0.44–1.00)
GFR, Estimated: >60 mL/min (ref >60)
Glucose, Bld: 124 mg/dL — ABNORMAL HIGH (ref 70–99)
Potassium: 5.3 mmol/L — ABNORMAL HIGH (ref 3.5–5.1)
Sodium: 125 mmol/L — ABNORMAL LOW (ref 135–145)
Total Bilirubin: 1 mg/dL (ref <1.2)
Total Protein: 6.9 g/dL (ref 6.5–8.1)

## 2023-07-26 LAB — CBC WITH DIFFERENTIAL (CANCER CENTER ONLY)
Abs Immature Granulocytes: 0.39 10*3/uL — ABNORMAL HIGH (ref 0.00–0.07)
Basophils Absolute: 0 10*3/uL (ref 0.0–0.1)
Basophils Relative: 0 %
Eosinophils Absolute: 0 10*3/uL (ref 0.0–0.5)
Eosinophils Relative: 0 %
HCT: 38.6 % (ref 36.0–46.0)
Hemoglobin: 12.6 g/dL (ref 12.0–15.0)
Immature Granulocytes: 3 %
Lymphocytes Relative: 5 %
Lymphs Abs: 0.8 10*3/uL (ref 0.7–4.0)
MCH: 27.6 pg (ref 26.0–34.0)
MCHC: 32.6 g/dL (ref 30.0–36.0)
MCV: 84.5 fL (ref 80.0–100.0)
Monocytes Absolute: 0.4 10*3/uL (ref 0.1–1.0)
Monocytes Relative: 2 %
Neutro Abs: 13.9 10*3/uL — ABNORMAL HIGH (ref 1.7–7.7)
Neutrophils Relative %: 90 %
Platelet Count: 190 10*3/uL (ref 150–400)
RBC: 4.57 MIL/uL (ref 3.87–5.11)
RDW: 16.6 % — ABNORMAL HIGH (ref 11.5–15.5)
WBC Count: 15.6 10*3/uL — ABNORMAL HIGH (ref 4.0–10.5)
nRBC: 0 % (ref 0.0–0.2)

## 2023-07-26 MED ORDER — HYDROCORTISONE 5 MG PO TABS: ORAL_TABLET | ORAL | 3 refills | Status: DC

## 2023-07-26 MED ORDER — SODIUM CHLORIDE 0.9 % IV SOLN
INTRAVENOUS | Status: DC
Start: 2023-07-26 — End: 2023-07-26
  Filled 2023-07-26 (×2): qty 250

## 2023-07-26 MED ORDER — SODIUM CHLORIDE 0.9% FLUSH
10.0000 mL | Freq: Once | INTRAVENOUS | Status: AC
Start: 2023-07-26 — End: 2023-07-26
  Administered 2023-07-26: 10 mL via INTRAVENOUS
  Filled 2023-07-26: qty 10

## 2023-07-26 MED ORDER — HEPARIN SOD (PORK) LOCK FLUSH 100 UNIT/ML IV SOLN
500.0000 [IU] | Freq: Once | INTRAVENOUS | Status: DC
  Administered 2023-07-26: 500 [IU] via INTRAVENOUS
  Filled 2023-07-26: qty 5

## 2023-07-26 MED ORDER — HYDROCORTISONE 20 MG PO TABS: ORAL_TABLET | ORAL | 3 refills | Status: DC

## 2023-07-26 NOTE — Progress Notes (Signed)
[facility]pt[facility] [phone] [phone]  Patient Care Team: Armando Gang, FNP as PCP - General (Family Medicine) Glory Buff, RN as Oncology Nurse Navigator Carmina Miller, MD as Radiation Oncologist (Radiation Oncology) Vida Rigger, MD as Consulting Physician (Pulmonary Disease) Creig Hines, MD as Consulting Physician (Oncology)   NAME OF PATIENT: Gina Sanchez  [id number]  03-06-1966   DATE OF VISIT: 07/26/23  REASON FOR CONSULT: Gina Sanchez is a 57 y.o. 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    Patient hospitalized 07/04/2023 to 07/09/2023 with sepsis from multifocal pneumonia.  She was also started on Keppra for possible seizures.  CT of the head showed significant bilateral metastatic disease, new when compared to previous images.  Patient was started on whole brain radiation.  Patient hospitalized again 07/19/2023 09/21/2022 with severe hypotension and acute metabolic encephalopathy.  INTERVAL HISTORY: Patient presents to [facility] for posthospital follow-up.  Today, patient says that she is feeling well better.  She denies symptomatic complaints or concerns at present.  Patient is accompanied by her sister-in-law, who says that patient is still not eating or drinking much.  Patient is also mostly bedbound and it is becoming increasingly difficult for her to get to clinic for appointments.  Denies fever or chills.  No cough, congestion, shortness of breath, or chest pain.  Denies nausea or vomiting.  Does have constipation.  Denies any urinary symptoms.  Denies other symptomatic complaints.  PAST MEDICAL HISTORY: Past Medical History:  Diagnosis Date   Abnormal Pap smear of cervix    Anxiety    C. 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   Chronic diarrhea     Closed fracture of distal end of left radius 03/23/2021   Colon polyp    Depression    Diverticulosis    Essential hypertension    Femur fracture, right (HCC) 09/14/2020   GERD (gastroesophageal reflux disease)    Hyperlipidemia    Leukocytosis    Lumbar radiculopathy    Lung cancer (HCC)    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   Metastatic cancer (HCC) 10/2020   lung, bone, lymph node, adrenal   Palpitations    Precordial chest pain    Wrist fracture 03/2021   left    PAST SURGICAL HISTORY:  Past Surgical History:  Procedure Laterality Date   BREAST BIOPSY Right 2017   benign   CERVICAL BIOPSY  W/ LOOP ELECTRODE EXCISION     COLONOSCOPY  06/11/2022   Procedure: COLONOSCOPY;  Surgeon: Toney Reil, MD;  Location: [facility] ENDOSCOPY;  Service: Gastroenterology;;   COLONOSCOPY WITH PROPOFOL N/A 07/03/2015   Procedure: COLONOSCOPY WITH PROPOFOL;  Surgeon: Wallace Cullens, MD;  Location: [facility] ENDOSCOPY;  Service: Gastroenterology;  Laterality: N/A;   COLONOSCOPY WITH PROPOFOL N/A 01/06/2022   Procedure: COLONOSCOPY WITH PROPOFOL;  Surgeon: Toney Reil, MD;  Location: [facility] ENDOSCOPY;  Service: Gastroenterology;  Laterality: N/A;   ESOPHAGOGASTRODUODENOSCOPY (EGD) WITH PROPOFOL N/A 07/03/2015   Procedure: ESOPHAGOGASTRODUODENOSCOPY (EGD) WITH PROPOFOL;  Surgeon: Wallace Cullens, MD;  Location: [facility] ENDOSCOPY;  Service: Gastroenterology;  Laterality: N/A;   FEMUR IM NAIL Left 06/30/2023   Procedure: Left prophylactic intramedullary nailing of impending left subtrochanteric femur fracture;  Surgeon: Christena Flake, MD;  Location: [facility] ORS;  Service: Orthopedics;  Laterality: Left;   FLEXIBLE SIGMOIDOSCOPY N/A 03/12/2022  Procedure: FLEXIBLE SIGMOIDOSCOPY;  Surgeon: Toney Reil, MD;  Location: [facility] ENDOSCOPY;  Service: Gastroenterology;  Laterality: N/A;   FLEXIBLE SIGMOIDOSCOPY N/A 08/17/2022   Procedure: FLEXIBLE SIGMOIDOSCOPY;  Surgeon: Toney Reil, MD;  Location: [facility]  ENDOSCOPY;  Service: Gastroenterology;  Laterality: N/A;   INTRAMEDULLARY (IM) NAIL INTERTROCHANTERIC Right 09/15/2020   Procedure: INTRAMEDULLARY (IM) NAIL INTERTROCHANTRIC;  Surgeon: Christena Flake, MD;  Location: [facility] ORS;  Service: Orthopedics;  Laterality: Right;   IR CV LINE INJECTION  07/14/2022   IR IMAGING GUIDED PORT INSERTION  11/28/2020   LEFT HEART CATH AND CORONARY ANGIOGRAPHY N/A 03/28/2017   Procedure: Left Heart Cath and Coronary Angiography;  Surgeon: Runell Gess, MD;  Location: [facility] INVASIVE CV LAB;  Service: Cardiovascular;  Laterality: N/A;   ORIF WRIST FRACTURE Left 03/31/2021   Procedure: OPEN REDUCTION INTERNAL FIXATION (ORIF) LEFT DISTAL RADIUS FRACTURE.;  Surgeon: Christena Flake, MD;  Location: [facility] ORS;  Service: Orthopedics;  Laterality: Left;    HEMATOLOGY/ONCOLOGY HISTORY:  Oncology History  Metastatic lung cancer (metastasis from lung to other site) [facility])  10/19/2020 Initial Diagnosis   Metastatic lung cancer (metastasis from lung to other site) [facility])   10/19/2020 Cancer Staging   Staging form: Lung, AJCC 8th Edition - Clinical: Stage IV (cT1, cN3, pM1) - Signed by Creig Hines, MD on 10/19/2020   10/27/2020 - 02/12/2022 Chemotherapy   Patient is on Treatment Plan : LUNG NSCLC Pemetrexed (Alimta) / Carboplatin q21d x 1 cycles     06/08/2022 - 09/15/2022 Chemotherapy   Patient is on Treatment Plan : LUNG NSCLC Pemetrexed + Carboplatin q21d x 4 Cycles     02/17/2023 -  Chemotherapy   Patient is on Treatment Plan : LUNG NSCLC Carboplatin + Paclitaxel + Bevacizumab q21d       ALLERGIES:  is allergic to factive [gemifloxacin].  MEDICATIONS:  Current Outpatient Medications  Medication Sig Dispense Refill   acetaminophen (TYLENOL) 500 MG tablet Take 500 mg by mouth every 6 (six) hours as needed.     ALPRAZolam (XANAX) 0.5 MG tablet TAKE 1 TABLET BY MOUTH DAILY AS NEEDED FOR ANXIETY. 30 tablet 0   citalopram (CELEXA) 10 MG tablet Take 1 tablet (10 mg total) by  mouth daily. 30 tablet 3   dexamethasone (DECADRON) 4 MG tablet Take 1 tablet (4 mg total) by mouth daily.     feeding supplement (ENSURE ENLIVE / ENSURE PLUS) LIQD Take 237 mLs by mouth 3 (three) times daily between meals. 21330 mL 0   fentaNYL (DURAGESIC) 25 MCG/HR Place 1 patch onto the skin every 3 (three) days. 10 patch 0   fluconazole (DIFLUCAN) 100 MG tablet Take 1 tablet (100 mg total) by mouth daily. 13 tablet 0   hydrocortisone (CORTEF) 10 MG tablet Take 1 tablet (10 mg total) by mouth at bedtime. 30 tablet 3   hydrocortisone (CORTEF) 20 MG tablet Take 1 tablet (20 mg total) by mouth every morning. 30 tablet 3   HYDROmorphone (DILAUDID) 2 MG tablet Take 1-2 tablets (2-4 mg total) by mouth every 4 (four) hours as needed for severe pain (pain score 7-10) or moderate pain (pain score 4-6). 20 tablet 0   HYDROmorphone (DILAUDID) 2 MG tablet Take 1-2 tablets (2-4 mg total) by mouth every 4 (four) hours as needed for severe pain (pain score 7-10). 120 tablet 0   lactulose (CHRONULAC) 10 GM/15ML solution Take 45 mLs (30 g total) by mouth 2 (two) times daily as needed for mild constipation. 946 mL  1   levETIRAcetam (KEPPRA) 500 MG tablet Take 1 tablet (500 mg total) by mouth 2 (two) times daily. 60 tablet 2   losartan (COZAAR) 50 MG tablet Take 1 tablet (50 mg total) by mouth daily. 30 tablet 0   metoprolol succinate (TOPROL-XL) 50 MG 24 hr tablet Take 50 mg by mouth daily. Take with or immediately following a meal.     nystatin (MYCOSTATIN) 100000 UNIT/ML suspension Take 5 mLs (500,000 Units total) by mouth 4 (four) times daily for 10 days. 200 mL 0   ondansetron (ZOFRAN) 4 MG tablet TAKE 1 TABLET BY MOUTH EVERY 8 HOURS AS NEEDED FOR NAUSEA AND VOMITING (Patient taking differently: Take 4 mg by mouth every 8 (eight) hours as needed for nausea or vomiting. TAKE 1 TABLET BY MOUTH EVERY 8 HOURS AS NEEDED FOR NAUSEA AND VOMITING) 20 tablet 1   prochlorperazine (COMPAZINE) 10 MG tablet Take 1 tablet (10  mg total) by mouth every 6 (six) hours as needed for nausea or vomiting. 30 tablet 1   No current facility-administered medications for this visit.   Facility-Administered Medications Ordered in Other Visits  Medication Dose Route Frequency Provider Last Rate Last Admin   heparin lock flush 100 unit/mL  500 Units Intravenous Once Creig Hines, MD       sodium chloride flush (NS) 0.9 % injection 10 mL  10 mL Intravenous PRN Creig Hines, MD   10 mL at 03/09/21 0906   sodium chloride flush (NS) 0.9 % injection 10 mL  10 mL Intravenous Once Creig Hines, MD        VITAL SIGNS: BP (!) 90/56   Pulse 100   Temp (!) 93.6 F (34.2 C) (Tympanic)   Resp 18   LMP 09/15/2018  There were no vitals filed for this visit.   Estimated body mass index is 23.98 kg/m as calculated from the following:   Height as of 07/19/23: 5\' 6"  (1.676 m).   Weight as of 07/23/23: 148 lb 9.4 oz (67.4 kg).  LABS: CBC:    Component Value Date/Time   WBC 12.5 (H) 07/22/2023 0515   HGB 12.3 07/22/2023 0515   HGB 13.2 07/19/2023 1354   HGB 13.0 05/31/2022 1156   HCT 37.4 07/22/2023 0515   HCT 40.9 05/31/2022 1156   PLT 204 07/22/2023 0515   PLT 306 07/19/2023 1354   PLT 331 05/31/2022 1156   MCV 85.4 07/22/2023 0515   MCV 87 05/31/2022 1156   NEUTROABS 10.6 (H) 07/20/2023 0540   NEUTROABS 6.5 03/14/2018 1343   LYMPHSABS 0.5 (L) 07/20/2023 0540   LYMPHSABS 4.1 (H) 03/14/2018 1343   MONOABS 0.6 07/20/2023 0540   EOSABS 0.0 07/20/2023 0540   EOSABS 0.4 03/14/2018 1343   BASOSABS 0.0 07/20/2023 0540   BASOSABS 0.0 03/14/2018 1343   Comprehensive Metabolic Panel:    Component Value Date/Time   NA 134 (L) 07/22/2023 0515   NA 141 05/31/2022 1156   K 4.4 07/22/2023 0515   CL 99 07/22/2023 0515   CO2 24 07/22/2023 0515   BUN 28 (H) 07/22/2023 0515   BUN 8 05/31/2022 1156   CREATININE 0.39 (L) 07/22/2023 0515   CREATININE 0.58 07/19/2023 1354   GLUCOSE 117 (H) 07/22/2023 0515   CALCIUM 8.9  07/22/2023 0515   AST 28 07/20/2023 0540   AST 38 07/19/2023 1354   ALT 35 07/20/2023 0540   ALT 40 07/19/2023 1354   ALKPHOS 181 (H) 07/20/2023 0540   BILITOT 1.3 (H)  07/20/2023 0540   BILITOT 1.4 (H) 07/19/2023 1354   PROT 6.2 (L) 07/20/2023 0540   PROT 5.9 (L) 05/31/2022 1156   ALBUMIN 3.0 (L) 07/20/2023 0540   ALBUMIN 3.4 (L) 05/31/2022 1156    RADIOGRAPHIC STUDIES: CT ABDOMEN PELVIS W CONTRAST  Result Date: 07/19/2023 CLINICAL DATA:  Pulmonary embolism, hypertension, altered mental status EXAM: CT ANGIOGRAPHY CHEST CT ABDOMEN AND PELVIS WITH CONTRAST TECHNIQUE: Multidetector CT imaging of the chest was performed using the standard protocol during bolus administration of intravenous contrast. Multiplanar CT image reconstructions and MIPs were obtained to evaluate the vascular anatomy. Multidetector CT imaging of the abdomen and pelvis was performed using the standard protocol during bolus administration of intravenous contrast. RADIATION DOSE REDUCTION: This exam was performed according to the departmental dose-optimization program which includes automated exposure control, adjustment of the mA and/or kV according to patient size and/or use of iterative reconstruction technique. CONTRAST:  OMNIPAQUE IOHEXOL 350 MG/ML SOLN COMPARISON:  07/05/2023 FINDINGS: CTA CHEST FINDINGS Cardiovascular: Minimal coronary artery calcification. Global cardiac size within normal limits. No pericardial effusion. Central pulmonary arteries are adequately opacified and there is no intraluminal filling defect identified through the segmental level. Central pulmonary arteries are of normal caliber. The thoracic aorta is unremarkable. Right internal jugular chest port tip is seen within the right atrium. Mediastinum/Nodes: There is infiltrative soft tissue within the right paratracheal space, best seen on axial image # 45/4, which is stable since prior examination and is associated with narrowing of the superior  vena cava in this region. This reflects the sequela of treated disease, better seen on PET CT examination of 10/02/2020. Borderline left pre-vascular lymph node is stable, nonspecific. No new pathologic thoracic adenopathy. Esophagus is unremarkable. Visualized thyroid is unremarkable. Lungs/Pleura: Previously noted focal consolidation within the a posterior right upper lobe and scattered ground-glass infiltrate within the right apex has improved in the interval in keeping with interval resolving infectious or inflammatory process. Residual scarring within the right upper lobe and right lower lobe noted. Mild emphysema. Stable 7 mm pulmonary nodule within the subpleural left upper lobe, axial image # 50/6, indeterminate. While stable since immediate prior examination, this first appeared on prior examination of 04/19/2023 and is indeterminate. No new focal pulmonary nodules or infiltrates. No pneumothorax or pleural effusion. Central airways are widely patent. Musculoskeletal: Widespread sclerotic metastases are again seen throughout the visualized axial skeleton, grossly stable since prior examination. No pathologic fracture. Review of the MIP images confirms the above findings. CT ABDOMEN and PELVIS FINDINGS Hepatobiliary: No focal liver abnormality is seen. No gallstones, gallbladder wall thickening, or biliary dilatation. Pancreas: Unremarkable Spleen: 19 and 16 mm hypoenhancing subcapsular masses within the spleen are stable since prior examination, compatible with splenic metastases. No new intrasplenic lesions. The spleen is of normal size. Adrenals/Urinary Tract: Bilateral adrenal metastases are grossly stable measuring 2.2 x 5.3 cm on the right and 1.3 x 2.5 cm on the left. The kidneys are unremarkable save for a simple cortical cyst within the lower pole the left kidney for which no specific follow-up imaging is recommended. The bladder is unremarkable. Stomach/Bowel: Stomach is within normal limits.  Appendix appears normal. No evidence of bowel wall thickening, distention, or inflammatory changes. Vascular/Lymphatic: Aortic atherosclerosis. No enlarged abdominal or pelvic lymph nodes. Reproductive: Uterus and bilateral adnexa are unremarkable. Other: No abdominal wall hernia or abnormality. No abdominopelvic ascites. Musculoskeletal: Bilateral hip ORIF has been performed. Widespread sclerotic metastases are again seen throughout the visualized axial skeleton, grossly stable since  prior examination. No pathologic fracture identified. Review of the MIP images confirms the above findings. IMPRESSION: 1. No pulmonary embolism. No acute intrathoracic pathology identified. 2. Minimal coronary artery calcification. 3. Stable infiltrative soft tissue within the right paratracheal space, associated with narrowing of the superior vena cava in this region. This reflects the sequela of treated disease, better seen on PET CT examination of 10/02/2020. 4. Stable 7 mm pulmonary nodule within the subpleural left upper lobe, indeterminate. While stable since immediate prior examination, this first appeared on prior examination of 04/19/2023 and a a isolated pulmonary metastasis is not excluded. Close observation on subsequent surveillance examination is warranted. 5. Widespread sclerotic metastases throughout the visualized axial skeleton, grossly stable since prior examination. No pathologic fracture identified. 6. Stable bilateral adrenal metastases. 7. Stable splenic metastases. Aortic Atherosclerosis (ICD10-I70.0) and Emphysema (ICD10-J43.9). Electronically Signed   By: Helyn Numbers M.D.   On: 07/19/2023 18:54   CT Angio Chest PE W and/or Wo Contrast  Result Date: 07/19/2023 CLINICAL DATA:  Pulmonary embolism, hypertension, altered mental status EXAM: CT ANGIOGRAPHY CHEST CT ABDOMEN AND PELVIS WITH CONTRAST TECHNIQUE: Multidetector CT imaging of the chest was performed using the standard protocol during bolus  administration of intravenous contrast. Multiplanar CT image reconstructions and MIPs were obtained to evaluate the vascular anatomy. Multidetector CT imaging of the abdomen and pelvis was performed using the standard protocol during bolus administration of intravenous contrast. RADIATION DOSE REDUCTION: This exam was performed according to the departmental dose-optimization program which includes automated exposure control, adjustment of the mA and/or kV according to patient size and/or use of iterative reconstruction technique. CONTRAST:  OMNIPAQUE IOHEXOL 350 MG/ML SOLN COMPARISON:  07/05/2023 FINDINGS: CTA CHEST FINDINGS Cardiovascular: Minimal coronary artery calcification. Global cardiac size within normal limits. No pericardial effusion. Central pulmonary arteries are adequately opacified and there is no intraluminal filling defect identified through the segmental level. Central pulmonary arteries are of normal caliber. The thoracic aorta is unremarkable. Right internal jugular chest port tip is seen within the right atrium. Mediastinum/Nodes: There is infiltrative soft tissue within the right paratracheal space, best seen on axial image # 45/4, which is stable since prior examination and is associated with narrowing of the superior vena cava in this region. This reflects the sequela of treated disease, better seen on PET CT examination of 10/02/2020. Borderline left pre-vascular lymph node is stable, nonspecific. No new pathologic thoracic adenopathy. Esophagus is unremarkable. Visualized thyroid is unremarkable. Lungs/Pleura: Previously noted focal consolidation within the a posterior right upper lobe and scattered ground-glass infiltrate within the right apex has improved in the interval in keeping with interval resolving infectious or inflammatory process. Residual scarring within the right upper lobe and right lower lobe noted. Mild emphysema. Stable 7 mm pulmonary nodule within the subpleural left  upper lobe, axial image # 50/6, indeterminate. While stable since immediate prior examination, this first appeared on prior examination of 04/19/2023 and is indeterminate. No new focal pulmonary nodules or infiltrates. No pneumothorax or pleural effusion. Central airways are widely patent. Musculoskeletal: Widespread sclerotic metastases are again seen throughout the visualized axial skeleton, grossly stable since prior examination. No pathologic fracture. Review of the MIP images confirms the above findings. CT ABDOMEN and PELVIS FINDINGS Hepatobiliary: No focal liver abnormality is seen. No gallstones, gallbladder wall thickening, or biliary dilatation. Pancreas: Unremarkable Spleen: 19 and 16 mm hypoenhancing subcapsular masses within the spleen are stable since prior examination, compatible with splenic metastases. No new intrasplenic lesions. The spleen is of normal size.  Adrenals/Urinary Tract: Bilateral adrenal metastases are grossly stable measuring 2.2 x 5.3 cm on the right and 1.3 x 2.5 cm on the left. The kidneys are unremarkable save for a simple cortical cyst within the lower pole the left kidney for which no specific follow-up imaging is recommended. The bladder is unremarkable. Stomach/Bowel: Stomach is within normal limits. Appendix appears normal. No evidence of bowel wall thickening, distention, or inflammatory changes. Vascular/Lymphatic: Aortic atherosclerosis. No enlarged abdominal or pelvic lymph nodes. Reproductive: Uterus and bilateral adnexa are unremarkable. Other: No abdominal wall hernia or abnormality. No abdominopelvic ascites. Musculoskeletal: Bilateral hip ORIF has been performed. Widespread sclerotic metastases are again seen throughout the visualized axial skeleton, grossly stable since prior examination. No pathologic fracture identified. Review of the MIP images confirms the above findings. IMPRESSION: 1. No pulmonary embolism. No acute intrathoracic pathology identified. 2.  Minimal coronary artery calcification. 3. Stable infiltrative soft tissue within the right paratracheal space, associated with narrowing of the superior vena cava in this region. This reflects the sequela of treated disease, better seen on PET CT examination of 10/02/2020. 4. Stable 7 mm pulmonary nodule within the subpleural left upper lobe, indeterminate. While stable since immediate prior examination, this first appeared on prior examination of 04/19/2023 and a a isolated pulmonary metastasis is not excluded. Close observation on subsequent surveillance examination is warranted. 5. Widespread sclerotic metastases throughout the visualized axial skeleton, grossly stable since prior examination. No pathologic fracture identified. 6. Stable bilateral adrenal metastases. 7. Stable splenic metastases. Aortic Atherosclerosis (ICD10-I70.0) and Emphysema (ICD10-J43.9). Electronically Signed   By: Helyn Numbers M.D.   On: 07/19/2023 18:54   DG Chest Port 1 View  Result Date: 07/19/2023 CLINICAL DATA:  Sepsis EXAM: PORTABLE CHEST 1 VIEW COMPARISON:  07/02/2023 FINDINGS: Lungs are clear. No pneumothorax or pleural effusion. Right internal jugular chest port tip seen within the right atrium. Cardiac size within normal limits. Pulmonary vascularity is normal. No acute bone abnormality. IMPRESSION: 1. No active disease. Electronically Signed   By: Helyn Numbers M.D.   On: 07/19/2023 18:25   CT Head Wo Contrast  Result Date: 07/19/2023 CLINICAL DATA:  Hypotension and altered mental status EXAM: CT HEAD WITHOUT CONTRAST TECHNIQUE: Contiguous axial images were obtained from the base of the skull through the vertex without intravenous contrast. RADIATION DOSE REDUCTION: This exam was performed according to the departmental dose-optimization program which includes automated exposure control, adjustment of the mA and/or kV according to patient size and/or use of iterative reconstruction technique. COMPARISON:  Brain MRI  07/05/2023 FINDINGS: Brain: There is no acute intracranial hemorrhage, extra-axial fluid collection, or acute infarct. Parenchymal volume is normal. The ventricles are normal in size. Gray-white differentiation is preserved. The pituitary and suprasellar region are normal. Small hyperdense lesions are seen in the left frontal subcortical white matter and right peritrigonal white matter corresponding to known metastatic lesions, better evaluated on recent brain MRI. There is no parenchymal edema or mass effect. There is no midline shift. Vascular: There is calcification of the bilateral carotid siphons. Skull: Normal. Negative for fracture or focal lesion. Sinuses/Orbits: The imaged paranasal sinuses are clear. The globes and orbits are unremarkable. Other: The mastoid air cells and middle ear cavities are clear. IMPRESSION: 1. No acute intracranial pathology. 2. No parenchymal edema or mass effect related to the known intracranial metastatic disease which was better seen on recent brain MRI. Electronically Signed   By: Lesia Hausen M.D.   On: 07/19/2023 17:27   EEG adult  Result Date:  07/06/2023 Jefferson Fuel, MD     07/08/2023  4:13 AM Routine EEG Report SHANQUILLA CADMAN is a 57 y.o. female with a history of seizures who is undergoing an EEG to evaluate for seizures. Report: This EEG was acquired with electrodes placed according to the International 10-20 electrode system (including Fp1, Fp2, F3, F4, C3, C4, P3, P4, O1, O2, T3, T4, T5, T6, A1, A2, Fz, Cz, Pz). The following electrodes were missing or displaced: none. The occipital dominant rhythm was 7 Hz with intermittent more pronounced diffuse slowing. This activity is reactive to stimulation. Drowsiness was manifested by background fragmentation; deeper stages of sleep were not identified. There was no focal slowing. There were no interictal epileptiform discharges. There were no electrographic seizures identified. There was no abnormal response to  photic stimulation or hyperventilation. Impression and clinical correlation: This EEG was obtained while awake and drowsy and is abnormal due to mild diffuse slowing indicative of global cerebral dysfunction. Epileptiform abnormalities were not seen during this recording. Bing Neighbors, MD [facility] [phone] If 7pm- 7am, please page neurology on call as listed in AMION.   MR BRAIN W WO CONTRAST  Result Date: 07/05/2023 CLINICAL DATA:  57 year old female altered mental status. 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 Subsequent encounter. EXAM: MRI HEAD WITHOUT AND WITH CONTRAST TECHNIQUE: Multiplanar, multiecho pulse sequences of the brain and surrounding structures were obtained without and with intravenous contrast. CONTRAST:  7.36mL GADAVIST GADOBUTROL 1 MMOL/ML IV SOLN COMPARISON:  Head CT this morning.  Brain MRI 01/20/2021. FINDINGS: Brain: Motion artifact on postcontrast imaging despite repeated imaging attempts. There are numerable subcentimeter FLAIR and T2 hyperintense foci scattered throughout the bilateral brain (see series 9), some of the largest of which correspond to the hyperdense foci by CT this morning (including adjacent to the right occipital horn series 9, image 24 measuring 9 mm). None of these are identified on SWI with susceptibility. However, some are mildly T1 hyperintense (such as in the left anterior frontal lobe series 7, image 16). But nearly all of the lesions are conspicuous on diffusion imaging, some are restricted and some have ring like morphology (series 13, image 28). None of the lesions are very apparent on motion degraded axial postcontrast imaging. But a few of them do appear to be enhancing on coronal postcontrast imaging in the posterior left hemisphere (series 21). Furthermore, when comparing pre and postcontrast sagittal  T1 weighted imaging, it is suspected that many of the small lesions do abnormally enhance (see series 22). No associated vasogenic edema. And no significant intracranial mass effect. No dural thickening or enhancement. No superimposed restricted diffusion which is strongly suggestive of acute infarction. No ventriculomegaly, extra-axial collection. Cervicomedullary junction and pituitary are within normal limits. Vascular: Major intracranial vascular flow voids are stable. Following contrast the major dural venous sinuses are enhancing and appear to be patent. Skull and upper cervical spine: Thick left superior frontal bone metastasis was better demonstrated by CT this morning, decreased T2 signal there is new since the 2022 MRI. No destructive osseous lesion is identified. Sinuses/Orbits: Stable, negative. Other: Mastoids remain well aerated. Negative visible scalp and face. IMPRESSION: 1. Innumerable subcentimeter brain lesions which are new since 2022. Although most are difficult to identify on motion degraded post-contrast images, the constellation of MRI findings is more suggestive of brain metastases than embolic infarcts or other differential considerations. And it is  possible that most of the lesions are treated metastases, with no associated vasogenic edema or mass effect. A short interval repeat MRI in 2 months may be valuable. 2. Progressed skull metastases since 2022 but no destructive osseous lesion is identified. Electronically Signed   By: Odessa Fleming M.D.   On: 07/05/2023 13:24   CT HEAD WO CONTRAST ( )  Result Date: 07/05/2023 CLINICAL DATA:  57 year old female altered mental status. Possible trace hemorrhage layering in the right occipital horn on head CT 0055 hours today., and small left anterior frontal lobe hyperdensity also. Metastatic lung cancer. EXAM: CT HEAD WITHOUT CONTRAST TECHNIQUE: Contiguous axial images were obtained from the base of the skull through the vertex without intravenous  contrast. RADIATION DOSE REDUCTION: This exam was performed according to the departmental dose-optimization program which includes automated exposure control, adjustment of the mA and/or kV according to patient size and/or use of iterative reconstruction technique. COMPARISON:  Head CT 0055 hours today.  Brain MRI 01/20/2021. FINDINGS: Brain: Unchanged small 5 mm hyperdense foci in both the left anterior frontal and right occipital lobes both visible on series 2, image 13 now. No associated edema or mass effect by CT. The right occipital lesion now is seen to be separate from the occipital horn which is on series 2, image 15. These appear to be parenchymal. Furthermore, there is a subtle 3rd hyperdense lesion in the right anterior inferior temporal lobe sagittal image 21. And possibly additional punctate lesions such as the left frontal lobe on series 2, image 17. All of these are new since 09/16/2022. No midline shift or intracranial mass effect. No ventriculomegaly. No extra-axial blood identified. No cortically based acute infarct identified. Normal basilar cisterns. Vascular: No suspicious intracranial vascular hyperdensity. Skull: Abnormal left frontal bone appears infiltrated with abnormal sclerosis and periosteal reaction on series 3, image 51, new since 09/16/2022. This is compatible with skull metastasis. No superimposed skull fracture is identified. No lytic bone lesion is identified. Sinuses/Orbits: Visualized paranasal sinuses and mastoids are stable and well aerated. Other: Visualized orbits and scalp soft tissues are within normal limits. IMPRESSION: 1. 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 2. At least 3 subcentimeter hyperdense foci in the bilateral brain are more likely small hyperdense Brain Metastases than areas of intracranial blood. Brain MRI without and with contrast should be confirmatory and could be compared to 01/20/2021. 3. No associated intracranial mass  effect.  No cerebral edema by CT. Electronically Signed   By: Odessa Fleming M.D.   On: 07/05/2023 07:54   CT CHEST ABDOMEN PELVIS W CONTRAST  Result Date: 07/05/2023 CLINICAL DATA:  Sepsis.  History of metastatic lung cancer. EXAM: CT CHEST, ABDOMEN, AND PELVIS WITH CONTRAST TECHNIQUE: Multidetector CT imaging of the chest, abdomen and pelvis was performed following the standard protocol during bolus administration of intravenous contrast. RADIATION DOSE REDUCTION: This exam was performed according to the departmental dose-optimization program which includes automated exposure control, adjustment of the mA and/or kV according to patient size and/or use of iterative reconstruction technique. CONTRAST:  OMNIPAQUE IOHEXOL 300 MG/ML  SOLN COMPARISON:  CT chest abdomen pelvis 04/19/2023 FINDINGS: CT CHEST FINDINGS Cardiovascular: Right chest wall accessed Port-A-Cath with tip at the superior cavoatrial junction. Normal heart size. No significant pericardial effusion. The thoracic aorta is normal in caliber. No atherosclerotic plaque of the thoracic aorta. No coronary artery calcifications. Mediastinum/Nodes: Stable 1 cm subcarinal lymph node. No enlarged mediastinal, hilar, or axillary lymph nodes. Trachea and esophagus demonstrate  no significant findings. Tiny hiatal hernia. Subcentimeter hypodensity of the right thyroid gland-no further follow-up indicated. Lungs/Pleura: Right upper lobe and lower lobe patchy airspace opacities. Stable left lower lobe 8 x 7 mm subpleural nodule (4:50). No pulmonary mass. No pleural effusion. No pneumothorax. Musculoskeletal: No chest wall abnormality. Chronic diffuse axial and appendicular sclerotic osseous lesions. No acute displaced fracture. Multilevel degenerative changes of the spine. CT ABDOMEN PELVIS FINDINGS Hepatobiliary: Vague hypodensity along the falciform ligament likely focal fatty infiltration. No gallstones, gallbladder wall thickening, or pericholecystic fluid. No  biliary dilatation. Pancreas: No focal lesion. Normal pancreatic contour. No surrounding inflammatory changes. No main pancreatic ductal dilatation. Spleen: Normal in size. Slightly more conspicuous splenic hypodensities measuring of 2 cm (2:53). Adrenals/Urinary Tract: Stable left adrenal gland nodules measuring of to 2.4 x 1.2 cm. Stable right adrenal gland nodules measuring up to 4.6 x 1.6 cm. Bilateral kidneys enhance symmetrically. No hydronephrosis. No hydroureter. The urinary bladder is unremarkable. Stomach/Bowel: Stomach is within normal limits. No evidence of bowel wall thickening or dilatation. Appendix appears normal. Vascular/Lymphatic: No abdominal aorta or iliac aneurysm. Mild atherosclerotic plaque of the aorta and its branches. No abdominal, pelvic, or inguinal lymphadenopathy. Reproductive: Uterus and bilateral adnexa are unremarkable. Other: No intraperitoneal free fluid. No intraperitoneal free gas. No organized fluid collection. Musculoskeletal: No abdominal wall hernia or abnormality. Chronic diffuse axial and appendicular sclerotic osseous lesions. No acute displaced fracture. Six lumbar vertebral bodies with sacralization of the L6 level. Grade 1 anterolisthesis L4 on L5. Mild retrolisthesis of L1 on L2. Bilateral intramedullary nail fixation of the femurs. Persistent periprosthetic lucency along the right femoral shaft surgical hardware. IMPRESSION: 1. Multifocal right lung pneumonia versus aspiration pneumonia. Underlying malignancy not fully excluded. 2. Stable left lower lobe 8 x 7 mm subpleural nodule. 3. Stable indeterminate bilateral adrenal gland nodules. 4. Stable indeterminate splenic hypodensities. 5. Chronic diffuse axial and appendicular sclerotic osseous metastases. Electronically Signed   By: Tish Frederickson M.D.   On: 07/05/2023 02:23   CT HEAD WO CONTRAST ( )  Result Date: 07/05/2023 CLINICAL DATA:  Mental status change, unknown cause Pt discharged yesterday after rod  placement in L leg - pt altered since d/c yesterday. EXAM: CT HEAD WITHOUT CONTRAST TECHNIQUE: Contiguous axial images were obtained from the base of the skull through the vertex without intravenous contrast. RADIATION DOSE REDUCTION: This exam was performed according to the departmental dose-optimization program which includes automated exposure control, adjustment of the mA and/or kV according to patient size and/or use of iterative reconstruction technique. COMPARISON:  CT head 09/16/2022 FINDINGS: Brain: No evidence of large-territorial acute infarction. No definite parenchymal hemorrhage. No mass lesion. No definite extra-axial collection. A 3 mm hyperdensity in the region of the posterior horn of the right lateral ventricle. Question vague 4 mm hyperdensity versus artifact within the left frontal lobe (3:14). No mass effect or midline shift. No hydrocephalus. Basilar cisterns are patent. Vascular: No hyperdense vessel. Skull: No acute fracture or focal lesion. Sinuses/Orbits: Paranasal sinuses and mastoid air cells are clear. The orbits are unremarkable. Other: None. IMPRESSION: 1. A 3 mm hyperdensity in the region of the posterior horn of the right lateral ventricle. Indeterminate etiology with hemorrhage not excluded. Recommend follow-up CT in 4-6 hours to evaluate for stability. 2. Question vague 4 mm hyperdensity versus artifact within the left frontal lobe. Recommended attention on follow-up Electronically Signed   By: Tish Frederickson M.D.   On: 07/05/2023 02:01   DG Chest 1 View  Result Date: 07/02/2023 CLINICAL  DATA:  Tachycardia EXAM: CHEST  1 VIEW COMPARISON:  None Available. FINDINGS: Abnormal mediastinal silhouette, compatible with known adenopathy. New somewhat nodular opacity in the right midlung. No visible pleural effusions or pneumothorax. Polyarticular degenerative change. IMPRESSION: 1. New somewhat nodular opacity in the right midlung which could represent progressive metastatic  disease, treatment change, and/or infection. CT of the chest with contrast could further evaluate if clinically warranted 2. Abnormal mediastinal silhouette, compatible with known lymphadenopathy. Electronically Signed   By: Feliberto Harts M.D.   On: 07/02/2023 13:02   DG HIP UNILAT WITH PELVIS 2-3 VIEWS LEFT  Result Date: 06/30/2023 CLINICAL DATA:  Left hip surgery.  Intraoperative fluoroscopy. EXAM: DG HIP (WITH OR WITHOUT PELVIS) 2-3V LEFT COMPARISON:  Left femur radiographs 04/20/2023 FINDINGS: Images were performed intraoperatively without the presence of a radiologist. Interval left femoral long intramedullary nail fixation, spanning the multiple previously seen sclerotic left femoral bone diaphyses. No hardware complication is seen. Total fluoroscopy images: 4 Total fluoroscopy time: 120 seconds Total dose: Radiation Exposure Index (as provided by the fluoroscopic device): 20.73 mGy air Kerma Please see intraoperative findings for further detail. IMPRESSION: Intraoperative fluoroscopy for left femoral long intramedullary nail fixation. Electronically Signed   By: Neita Garnet M.D.   On: 06/30/2023 16:44   DG C-Arm 1-60 Min-No Report  Result Date: 06/30/2023 Fluoroscopy was utilized by the requesting physician.  No radiographic interpretation.   DG C-Arm 1-60 Min-No Report  Result Date: 06/30/2023 Fluoroscopy was utilized by the requesting physician.  No radiographic interpretation.    PERFORMANCE STATUS (ECOG) : 1 - Symptomatic but completely ambulatory  Review of Systems Unless otherwise noted, a complete review of systems is negative.  Physical Exam General: NAD Cardiac: tachy, regular rhythm Pulmonary: clear anterior/posterior fields Abdominal: Soft, nontender to palp Extremities: no edema, no joint deformities Skin: no rashes Neurological: Weakness but otherwise nonfocal  IMPRESSION/PLAN: Stage IV non-small cell lung cancer -maintenance Avastin on hold due to recent  left hip surgery and whole brain radiation. Patient has follow up next week with Dr. Smith Robert.  Discussed goals with patient in light of her poor performance status.  Patient says that she remains committed to pursuing further cancer treatment.  We did discuss the alternative option of hospice/best supportive care.  Dehydration -proceed with IV fluids today.  Patient has poor oral intake.  Will plan for patient to return twice weekly to clinic for fluids and supportive care.  Neoplasm related pain -discussed pain regimen with fentanyl/hydromorphone.  Daily bowel regimen to prevent opioid-induced constipation.  Patient has had a bowel movement since discharging home from the hospital.  Autoimmune hypophysitis - causing adrenal insufficiency. May be etiology of hyponatremia and hyperkalemia.  May also be contributing factor to hypotension.  Discussed with Dr. Smith Robert and will increase hydrocortisone to 25 mg in a.m. and 10 mg in p.m.  Case and plan discussed with Dr. Smith Robert  Patient expressed understanding and was in agreement with this plan. She also understands that She can call clinic at any time with any questions, concerns, or complaints.   Thank you for allowing me to participate in the care of this very pleasant patient.   Time Total: 25 minutes  Visit consisted of counseling and education dealing with the complex and emotionally intense issues of symptom management in the setting of serious illness.Greater than 50%  of this time was spent counseling and coordinating care related to the above assessment and plan.  Signed by: Laurette Schimke, PhD, NP-C

## 2023-07-27 ENCOUNTER — Ambulatory Visit: Payer: 59

## 2023-07-27 ENCOUNTER — Ambulatory Visit: Admission: RE | Admit: 2023-07-27 | Payer: 59 | Source: Ambulatory Visit

## 2023-07-28 ENCOUNTER — Ambulatory Visit: Payer: 59

## 2023-07-28 DIAGNOSIS — M84552D Pathological fracture in neoplastic disease, left femur, subsequent encounter for fracture with routine healing: Secondary | ICD-10-CM | POA: Diagnosis not present

## 2023-07-28 DIAGNOSIS — F32A Depression, unspecified: Secondary | ICD-10-CM | POA: Diagnosis not present

## 2023-07-28 DIAGNOSIS — I10 Essential (primary) hypertension: Secondary | ICD-10-CM | POA: Diagnosis not present

## 2023-07-28 DIAGNOSIS — E785 Hyperlipidemia, unspecified: Secondary | ICD-10-CM | POA: Diagnosis not present

## 2023-07-28 DIAGNOSIS — Z556 Problems related to health literacy: Secondary | ICD-10-CM | POA: Diagnosis not present

## 2023-07-28 DIAGNOSIS — K219 Gastro-esophageal reflux disease without esophagitis: Secondary | ICD-10-CM | POA: Diagnosis not present

## 2023-07-28 DIAGNOSIS — Z604 Social exclusion and rejection: Secondary | ICD-10-CM | POA: Diagnosis not present

## 2023-07-28 DIAGNOSIS — Z87891 Personal history of nicotine dependence: Secondary | ICD-10-CM | POA: Diagnosis not present

## 2023-07-28 DIAGNOSIS — M5416 Radiculopathy, lumbar region: Secondary | ICD-10-CM | POA: Diagnosis not present

## 2023-07-28 DIAGNOSIS — K523 Indeterminate colitis: Secondary | ICD-10-CM | POA: Diagnosis not present

## 2023-07-28 DIAGNOSIS — K5909 Other constipation: Secondary | ICD-10-CM | POA: Diagnosis not present

## 2023-07-28 DIAGNOSIS — C3491 Malignant neoplasm of unspecified part of right bronchus or lung: Secondary | ICD-10-CM | POA: Diagnosis not present

## 2023-07-28 DIAGNOSIS — F411 Generalized anxiety disorder: Secondary | ICD-10-CM | POA: Diagnosis not present

## 2023-07-28 DIAGNOSIS — R32 Unspecified urinary incontinence: Secondary | ICD-10-CM | POA: Diagnosis not present

## 2023-07-28 DIAGNOSIS — K5790 Diverticulosis of intestine, part unspecified, without perforation or abscess without bleeding: Secondary | ICD-10-CM | POA: Diagnosis not present

## 2023-07-28 DIAGNOSIS — C770 Secondary and unspecified malignant neoplasm of lymph nodes of head, face and neck: Secondary | ICD-10-CM | POA: Diagnosis not present

## 2023-07-28 DIAGNOSIS — C7951 Secondary malignant neoplasm of bone: Secondary | ICD-10-CM | POA: Diagnosis not present

## 2023-07-28 DIAGNOSIS — Z8701 Personal history of pneumonia (recurrent): Secondary | ICD-10-CM | POA: Diagnosis not present

## 2023-07-28 DIAGNOSIS — G893 Neoplasm related pain (acute) (chronic): Secondary | ICD-10-CM | POA: Diagnosis not present

## 2023-07-28 DIAGNOSIS — Z860100 Personal history of colon polyps, unspecified: Secondary | ICD-10-CM | POA: Diagnosis not present

## 2023-07-28 DIAGNOSIS — C7970 Secondary malignant neoplasm of unspecified adrenal gland: Secondary | ICD-10-CM | POA: Diagnosis not present

## 2023-07-29 ENCOUNTER — Ambulatory Visit: Payer: 59

## 2023-07-29 ENCOUNTER — Other Ambulatory Visit: Payer: Self-pay

## 2023-07-29 ENCOUNTER — Inpatient Hospital Stay: Payer: 59

## 2023-07-29 ENCOUNTER — Encounter: Payer: Self-pay | Admitting: Hospice and Palliative Medicine

## 2023-07-29 ENCOUNTER — Inpatient Hospital Stay (HOSPITAL_BASED_OUTPATIENT_CLINIC_OR_DEPARTMENT_OTHER): Payer: 59 | Admitting: Hospice and Palliative Medicine

## 2023-07-29 VITALS — BP 111/92 | HR 65 | Temp 95.0°F | Resp 20

## 2023-07-29 DIAGNOSIS — Z515 Encounter for palliative care: Secondary | ICD-10-CM | POA: Diagnosis not present

## 2023-07-29 DIAGNOSIS — C3491 Malignant neoplasm of unspecified part of right bronchus or lung: Secondary | ICD-10-CM | POA: Diagnosis not present

## 2023-07-29 DIAGNOSIS — C7951 Secondary malignant neoplasm of bone: Secondary | ICD-10-CM | POA: Diagnosis not present

## 2023-07-29 DIAGNOSIS — E860 Dehydration: Secondary | ICD-10-CM

## 2023-07-29 DIAGNOSIS — G893 Neoplasm related pain (acute) (chronic): Secondary | ICD-10-CM

## 2023-07-29 LAB — BASIC METABOLIC PANEL - CANCER CENTER ONLY
Anion gap: 16 — ABNORMAL HIGH (ref 5–15)
BUN: 27 mg/dL — ABNORMAL HIGH (ref 6–20)
CO2: 19 mmol/L — ABNORMAL LOW (ref 22–32)
Calcium: 8.8 mg/dL — ABNORMAL LOW (ref 8.9–10.3)
Chloride: 92 mmol/L — ABNORMAL LOW (ref 98–111)
Creatinine: 0.51 mg/dL (ref 0.44–1.00)
GFR, Estimated: >60 mL/min (ref >60)
Glucose, Bld: 144 mg/dL — ABNORMAL HIGH (ref 70–99)
Potassium: 4.9 mmol/L (ref 3.5–5.1)
Sodium: 127 mmol/L — ABNORMAL LOW (ref 135–145)

## 2023-07-29 MED ORDER — HEPARIN SOD (PORK) LOCK FLUSH 100 UNIT/ML IV SOLN
500.0000 [IU] | Freq: Once | INTRAVENOUS | Status: AC
  Administered 2023-07-29: 500 [IU] via INTRAVENOUS
  Filled 2023-07-29: qty 5

## 2023-07-29 MED ORDER — HYDROMORPHONE HCL 1 MG/ML IJ SOLN
0.5000 mg | Freq: Once | INTRAMUSCULAR | Status: AC
  Administered 2023-07-29: 0.5 mg via INTRAVENOUS
  Filled 2023-07-29: qty 1

## 2023-07-29 MED ORDER — SODIUM CHLORIDE 0.9 % IV SOLN
INTRAVENOUS | Status: DC
  Filled 2023-07-29 (×2): qty 250

## 2023-07-29 MED ORDER — FENTANYL 50 MCG/HR TD PT72: 1.0000 | MEDICATED_PATCH | TRANSDERMAL | 0 refills | Status: DC

## 2023-07-29 MED ORDER — SODIUM CHLORIDE 0.9% FLUSH
10.0000 mL | Freq: Once | INTRAVENOUS | Status: AC
  Administered 2023-07-29: 10 mL via INTRAVENOUS
  Filled 2023-07-29: qty 10

## 2023-07-29 NOTE — Progress Notes (Signed)
Pt here for lab/fluid clinic. Upon arrival notified by registration patient tearful, yelling, in pain. Brought back to fluid clinic, port accessed. 0.5mg  Dilaudid ordered by Elouise Munroe NP for 10/10 back pain. IVF started. Josh Borders NP in to speak with patient and husband about goals of care. Pt pain relieved by Dilaudid. DIscharged home. Pt to return to clinic on Monday for lab/MD/possible fluids

## 2023-07-29 NOTE — Progress Notes (Signed)
Sympt[facility] Telephone:(336) [phone] Fax:(336) [phone]  Patient Care Team: Armando Gang, FNP as PCP - General (Family Medicine) Glory Buff, RN as Oncology Nurse Navigator Carmina Miller, MD as Radiation Oncologist (Radiation Oncology) Vida Rigger, MD as Consulting Physician (Pulmonary Disease) Creig Hines, MD as Consulting Physician (Oncology)   NAME OF PATIENT: Gina Sanchez  [id number]  02-03-1966   DATE OF VISIT: 07/29/23  REASON FOR CONSULT: Gina Sanchez is a 57 y.o. 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    Patient hospitalized 07/04/2023 to 07/09/2023 with sepsis from multifocal pneumonia.  She was also started on Keppra for possible seizures.  CT of the head showed significant bilateral metastatic disease, new when compared to previous images.  Patient was started on whole brain radiation.  Patient hospitalized again 07/19/2023 09/21/2022 with severe hypotension and acute metabolic encephalopathy.  INTERVAL HISTORY: Patient was here for labs/IV fluids and was an add-on to [facility] to address pain management.  Patient initially yelling loudly endorsing pain to low back.  She has been unable to tolerate radiation treatments this week due to pain and difficulty with physically transporting her to the cancer center.  Performance status has declined.  Appetite is poor.  Denies fever or chills.  No cough, congestion, shortness of breath, or chest pain.  Denies nausea or vomiting.  Does have constipation.  Denies any urinary symptoms.  Denies other symptomatic complaints.  PAST MEDICAL HISTORY: Past Medical History:  Diagnosis Date   Abnormal Pap smear of cervix    Anxiety    C. 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   Chronic diarrhea    Closed fracture of distal end of left  radius 03/23/2021   Colon polyp    Depression    Diverticulosis    Essential hypertension    Femur fracture, right (HCC) 09/14/2020   GERD (gastroesophageal reflux disease)    Hyperlipidemia    Leukocytosis    Lumbar radiculopathy    Lung cancer (HCC)    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   Metastatic cancer (HCC) 10/2020   lung, bone, lymph node, adrenal   Palpitations    Precordial chest pain    Wrist fracture 03/2021   left    PAST SURGICAL HISTORY:  Past Surgical History:  Procedure Laterality Date   BREAST BIOPSY Right 2017   benign   CERVICAL BIOPSY  W/ LOOP ELECTRODE EXCISION     COLONOSCOPY  06/11/2022   Procedure: COLONOSCOPY;  Surgeon: Toney Reil, MD;  Location: [facility] ENDOSCOPY;  Service: Gastroenterology;;   COLONOSCOPY WITH PROPOFOL N/A 07/03/2015   Procedure: COLONOSCOPY WITH PROPOFOL;  Surgeon: Wallace Cullens, MD;  Location[facility] ENDOSCOPY;  Service: Gastroenterology;  Laterality: N/A;   COLONOSCOPY WITH PROPOFOL N/A 01/06/2022   Procedure: COLONOSCOPY WITH PROPOFOL;  Surgeon: Toney Reil, MD;  Location: [facility] ENDOSCOPY;  Service: Gastroenterology;  Laterality: N/A;   ESOPHAGOGASTRODUODENOSCOPY (EGD) WITH PROPOFOL N/A 07/03/2015   Procedure: ESOPHAGOGASTRODUODENOSCOPY (EGD) WITH PROPOFOL;  Surgeon: Wallace Cullens, MD;  Location: [facility] ENDOSCOPY;  Service: Gastroenterology;  Laterality: N/A;   FEMUR IM NAIL Left 06/30/2023   Procedure: Left prophylactic intramedullary nailing of impending left subtrochanteric femur fracture;  Surgeon: Christena Flake, MD;  Location: [facility] ORS;  Service: Orthopedics;  Laterality: Left;   FLEXIBLE SIGMOIDOSCOPY N/A 03/12/2022   Procedure: FLEXIBLE SIGMOIDOSCOPY;  Surgeon:  Toney Reil, MD;  Location: [facility] ENDOSCOPY;  Service: Gastroenterology;  Laterality: N/A;   FLEXIBLE SIGMOIDOSCOPY N/A 08/17/2022   Procedure: FLEXIBLE SIGMOIDOSCOPY;  Surgeon: Toney Reil, MD;  Location: [facility] ENDOSCOPY;  Service:  Gastroenterology;  Laterality: N/A;   INTRAMEDULLARY (IM) NAIL INTERTROCHANTERIC Right 09/15/2020   Procedure: INTRAMEDULLARY (IM) NAIL INTERTROCHANTRIC;  Surgeon: Christena Flake, MD;  Location: [facility] ORS;  Service: Orthopedics;  Laterality: Right;   IR CV LINE INJECTION  07/14/2022   IR IMAGING GUIDED PORT INSERTION  11/28/2020   LEFT HEART CATH AND CORONARY ANGIOGRAPHY N/A 03/28/2017   Procedure: Left Heart Cath and Coronary Angiography;  Surgeon: Runell Gess, MD;  Location: [facility] INVASIVE CV LAB;  Service: Cardiovascular;  Laterality: N/A;   ORIF WRIST FRACTURE Left 03/31/2021   Procedure: OPEN REDUCTION INTERNAL FIXATION (ORIF) LEFT DISTAL RADIUS FRACTURE.;  Surgeon: Christena Flake, MD;  Location: [facility] ORS;  Service: Orthopedics;  Laterality: Left;    HEMATOLOGY/ONCOLOGY HISTORY:  Oncology History  Metastatic lung cancer (metastasis from lung to other site) [facility])  10/19/2020 Initial Diagnosis   Metastatic lung cancer (metastasis from lung to other site) [facility])   10/19/2020 Cancer Staging   Staging form: Lung, AJCC 8th Edition - Clinical: Stage IV (cT1, cN3, pM1) - Signed by Creig Hines, MD on 10/19/2020   10/27/2020 - 02/12/2022 Chemotherapy   Patient is on Treatment Plan : LUNG NSCLC Pemetrexed (Alimta) / Carboplatin q21d x 1 cycles     06/08/2022 - 09/15/2022 Chemotherapy   Patient is on Treatment Plan : LUNG NSCLC Pemetrexed + Carboplatin q21d x 4 Cycles     02/17/2023 -  Chemotherapy   Patient is on Treatment Plan : LUNG NSCLC Carboplatin + Paclitaxel + Bevacizumab q21d       ALLERGIES:  is allergic to factive [gemifloxacin].  MEDICATIONS:  Current Outpatient Medications  Medication Sig Dispense Refill   acetaminophen (TYLENOL) 500 MG tablet Take 500 mg by mouth every 6 (six) hours as needed.     ALPRAZolam (XANAX) 0.5 MG tablet TAKE 1 TABLET BY MOUTH DAILY AS NEEDED FOR ANXIETY. 30 tablet 0   citalopram (CELEXA) 10 MG tablet Take 1 tablet (10 mg total) by mouth daily. 30 tablet 3    dexamethasone (DECADRON) 4 MG tablet Take 1 tablet (4 mg total) by mouth daily.     feeding supplement (ENSURE ENLIVE / ENSURE PLUS) LIQD Take 237 mLs by mouth 3 (three) times daily between meals. 21330 mL 0   fentaNYL (DURAGESIC) 25 MCG/HR Place 1 patch onto the skin every 3 (three) days. 10 patch 0   fluconazole (DIFLUCAN) 100 MG tablet Take 1 tablet (100 mg total) by mouth daily. 13 tablet 0   hydrocortisone (CORTEF) 10 MG tablet Take 1 tablet (10 mg total) by mouth at bedtime. 30 tablet 3   hydrocortisone (CORTEF) 20 MG tablet Take 20mg  tablet + 5mg  tablet to equal 25mg  daily in AM 30 tablet 3   hydrocortisone (CORTEF) 5 MG tablet Take with 20mg  tablet to equal 25mg  daily in AM 30 tablet 3   HYDROmorphone (DILAUDID) 2 MG tablet Take 1-2 tablets (2-4 mg total) by mouth every 4 (four) hours as needed for severe pain (pain score 7-10) or moderate pain (pain score 4-6). 20 tablet 0   HYDROmorphone (DILAUDID) 2 MG tablet Take 1-2 tablets (2-4 mg total) by mouth every 4 (four) hours as needed for severe pain (pain score 7-10). 120 tablet 0   lactulose (CHRONULAC) 10 GM/15ML solution Take 45  mLs (30 g total) by mouth 2 (two) times daily as needed for mild constipation. 946 mL 1   levETIRAcetam (KEPPRA) 500 MG tablet Take 1 tablet (500 mg total) by mouth 2 (two) times daily. 60 tablet 2   losartan (COZAAR) 50 MG tablet Take 1 tablet (50 mg total) by mouth daily. 30 tablet 0   metoprolol succinate (TOPROL-XL) 50 MG 24 hr tablet Take 50 mg by mouth daily. Take with or immediately following a meal.     nystatin (MYCOSTATIN) 100000 UNIT/ML suspension Take 5 mLs (500,000 Units total) by mouth 4 (four) times daily for 10 days. 200 mL 0   ondansetron (ZOFRAN) 4 MG tablet TAKE 1 TABLET BY MOUTH EVERY 8 HOURS AS NEEDED FOR NAUSEA AND VOMITING (Patient taking differently: Take 4 mg by mouth every 8 (eight) hours as needed for nausea or vomiting. TAKE 1 TABLET BY MOUTH EVERY 8 HOURS AS NEEDED FOR NAUSEA AND  VOMITING) 20 tablet 1   prochlorperazine (COMPAZINE) 10 MG tablet Take 1 tablet (10 mg total) by mouth every 6 (six) hours as needed for nausea or vomiting. 30 tablet 1   No current facility-administered medications for this visit.   Facility-Administered Medications Ordered in Other Visits  Medication Dose Route Frequency Provider Last Rate Last Admin   0.9 %  sodium chloride infusion   Intravenous Continuous Derricka Mertz, Daryl Eastern, NP 500 mL/hr at 07/29/23 1416 New Bag at 07/29/23 1416   heparin lock flush 100 unit/mL  500 Units Intravenous Once Quinlynn Cuthbert, Ivin Booty R, NP       sodium chloride flush (NS) 0.9 % injection 10 mL  10 mL Intravenous PRN Creig Hines, MD   10 mL at 03/09/21 0906   sodium chloride flush (NS) 0.9 % injection 10 mL  10 mL Intravenous Once Brynlei Klausner, Daryl Eastern, NP        VITAL SIGNS: LMP 09/15/2018  There were no vitals filed for this visit.   Estimated body mass index is 23.98 kg/m as calculated from the following:   Height as of 07/19/23: 5\' 6"  (1.676 m).   Weight as of 07/23/23: 148 lb 9.4 oz (67.4 kg).  LABS: CBC:    Component Value Date/Time   WBC 15.6 (H) 07/26/2023 1353   WBC 12.5 (H) 07/22/2023 0515   HGB 12.6 07/26/2023 1353   HGB 13.0 05/31/2022 1156   HCT 38.6 07/26/2023 1353   HCT 40.9 05/31/2022 1156   PLT 190 07/26/2023 1353   PLT 331 05/31/2022 1156   MCV 84.5 07/26/2023 1353   MCV 87 05/31/2022 1156   NEUTROABS 13.9 (H) 07/26/2023 1353   NEUTROABS 6.5 03/14/2018 1343   LYMPHSABS 0.8 07/26/2023 1353   LYMPHSABS 4.1 (H) 03/14/2018 1343   MONOABS 0.4 07/26/2023 1353   EOSABS 0.0 07/26/2023 1353   EOSABS 0.4 03/14/2018 1343   BASOSABS 0.0 07/26/2023 1353   BASOSABS 0.0 03/14/2018 1343   Comprehensive Metabolic Panel:    Component Value Date/Time   NA 125 (L) 07/26/2023 1353   NA 141 05/31/2022 1156   K 5.3 (H) 07/26/2023 1353   CL 89 (L) 07/26/2023 1353   CO2 23 07/26/2023 1353   BUN 31 (H) 07/26/2023 1353   BUN 8 05/31/2022 1156    CREATININE 0.52 07/26/2023 1353   GLUCOSE 124 (H) 07/26/2023 1353   CALCIUM 8.6 (L) 07/26/2023 1353   AST 28 07/26/2023 1353   ALT 44 07/26/2023 1353   ALKPHOS 266 (H) 07/26/2023 1353   BILITOT 1.0 07/26/2023 1353  PROT 6.9 07/26/2023 1353   PROT 5.9 (L) 05/31/2022 1156   ALBUMIN 3.0 (L) 07/26/2023 1353   ALBUMIN 3.4 (L) 05/31/2022 1156    RADIOGRAPHIC STUDIES: CT ABDOMEN PELVIS W CONTRAST  Result Date: 07/19/2023 CLINICAL DATA:  Pulmonary embolism, hypertension, altered mental status EXAM: CT ANGIOGRAPHY CHEST CT ABDOMEN AND PELVIS WITH CONTRAST TECHNIQUE: Multidetector CT imaging of the chest was performed using the standard protocol during bolus administration of intravenous contrast. Multiplanar CT image reconstructions and MIPs were obtained to evaluate the vascular anatomy. Multidetector CT imaging of the abdomen and pelvis was performed using the standard protocol during bolus administration of intravenous contrast. RADIATION DOSE REDUCTION: This exam was performed according to the departmental dose-optimization program which includes automated exposure control, adjustment of the mA and/or kV according to patient size and/or use of iterative reconstruction technique. CONTRAST:  OMNIPAQUE IOHEXOL 350 MG/ML SOLN COMPARISON:  07/05/2023 FINDINGS: CTA CHEST FINDINGS Cardiovascular: Minimal coronary artery calcification. Global cardiac size within normal limits. No pericardial effusion. Central pulmonary arteries are adequately opacified and there is no intraluminal filling defect identified through the segmental level. Central pulmonary arteries are of normal caliber. The thoracic aorta is unremarkable. Right internal jugular chest port tip is seen within the right atrium. Mediastinum/Nodes: There is infiltrative soft tissue within the right paratracheal space, best seen on axial image # 45/4, which is stable since prior examination and is associated with narrowing of the superior vena cava  in this region. This reflects the sequela of treated disease, better seen on PET CT examination of 10/02/2020. Borderline left pre-vascular lymph node is stable, nonspecific. No new pathologic thoracic adenopathy. Esophagus is unremarkable. Visualized thyroid is unremarkable. Lungs/Pleura: Previously noted focal consolidation within the a posterior right upper lobe and scattered ground-glass infiltrate within the right apex has improved in the interval in keeping with interval resolving infectious or inflammatory process. Residual scarring within the right upper lobe and right lower lobe noted. Mild emphysema. Stable 7 mm pulmonary nodule within the subpleural left upper lobe, axial image # 50/6, indeterminate. While stable since immediate prior examination, this first appeared on prior examination of 04/19/2023 and is indeterminate. No new focal pulmonary nodules or infiltrates. No pneumothorax or pleural effusion. Central airways are widely patent. Musculoskeletal: Widespread sclerotic metastases are again seen throughout the visualized axial skeleton, grossly stable since prior examination. No pathologic fracture. Review of the MIP images confirms the above findings. CT ABDOMEN and PELVIS FINDINGS Hepatobiliary: No focal liver abnormality is seen. No gallstones, gallbladder wall thickening, or biliary dilatation. Pancreas: Unremarkable Spleen: 19 and 16 mm hypoenhancing subcapsular masses within the spleen are stable since prior examination, compatible with splenic metastases. No new intrasplenic lesions. The spleen is of normal size. Adrenals/Urinary Tract: Bilateral adrenal metastases are grossly stable measuring 2.2 x 5.3 cm on the right and 1.3 x 2.5 cm on the left. The kidneys are unremarkable save for a simple cortical cyst within the lower pole the left kidney for which no specific follow-up imaging is recommended. The bladder is unremarkable. Stomach/Bowel: Stomach is within normal limits. Appendix  appears normal. No evidence of bowel wall thickening, distention, or inflammatory changes. Vascular/Lymphatic: Aortic atherosclerosis. No enlarged abdominal or pelvic lymph nodes. Reproductive: Uterus and bilateral adnexa are unremarkable. Other: No abdominal wall hernia or abnormality. No abdominopelvic ascites. Musculoskeletal: Bilateral hip ORIF has been performed. Widespread sclerotic metastases are again seen throughout the visualized axial skeleton, grossly stable since prior examination. No pathologic fracture identified. Review of the MIP images confirms  the above findings. IMPRESSION: 1. No pulmonary embolism. No acute intrathoracic pathology identified. 2. Minimal coronary artery calcification. 3. Stable infiltrative soft tissue within the right paratracheal space, associated with narrowing of the superior vena cava in this region. This reflects the sequela of treated disease, better seen on PET CT examination of 10/02/2020. 4. Stable 7 mm pulmonary nodule within the subpleural left upper lobe, indeterminate. While stable since immediate prior examination, this first appeared on prior examination of 04/19/2023 and a a isolated pulmonary metastasis is not excluded. Close observation on subsequent surveillance examination is warranted. 5. Widespread sclerotic metastases throughout the visualized axial skeleton, grossly stable since prior examination. No pathologic fracture identified. 6. Stable bilateral adrenal metastases. 7. Stable splenic metastases. Aortic Atherosclerosis (ICD10-I70.0) and Emphysema (ICD10-J43.9). Electronically Signed   By: Helyn Numbers M.D.   On: 07/19/2023 18:54   CT Angio Chest PE W and/or Wo Contrast  Result Date: 07/19/2023 CLINICAL DATA:  Pulmonary embolism, hypertension, altered mental status EXAM: CT ANGIOGRAPHY CHEST CT ABDOMEN AND PELVIS WITH CONTRAST TECHNIQUE: Multidetector CT imaging of the chest was performed using the standard protocol during bolus administration of  intravenous contrast. Multiplanar CT image reconstructions and MIPs were obtained to evaluate the vascular anatomy. Multidetector CT imaging of the abdomen and pelvis was performed using the standard protocol during bolus administration of intravenous contrast. RADIATION DOSE REDUCTION: This exam was performed according to the departmental dose-optimization program which includes automated exposure control, adjustment of the mA and/or kV according to patient size and/or use of iterative reconstruction technique. CONTRAST:  OMNIPAQUE IOHEXOL 350 MG/ML SOLN COMPARISON:  07/05/2023 FINDINGS: CTA CHEST FINDINGS Cardiovascular: Minimal coronary artery calcification. Global cardiac size within normal limits. No pericardial effusion. Central pulmonary arteries are adequately opacified and there is no intraluminal filling defect identified through the segmental level. Central pulmonary arteries are of normal caliber. The thoracic aorta is unremarkable. Right internal jugular chest port tip is seen within the right atrium. Mediastinum/Nodes: There is infiltrative soft tissue within the right paratracheal space, best seen on axial image # 45/4, which is stable since prior examination and is associated with narrowing of the superior vena cava in this region. This reflects the sequela of treated disease, better seen on PET CT examination of 10/02/2020. Borderline left pre-vascular lymph node is stable, nonspecific. No new pathologic thoracic adenopathy. Esophagus is unremarkable. Visualized thyroid is unremarkable. Lungs/Pleura: Previously noted focal consolidation within the a posterior right upper lobe and scattered ground-glass infiltrate within the right apex has improved in the interval in keeping with interval resolving infectious or inflammatory process. Residual scarring within the right upper lobe and right lower lobe noted. Mild emphysema. Stable 7 mm pulmonary nodule within the subpleural left upper lobe, axial  image # 50/6, indeterminate. While stable since immediate prior examination, this first appeared on prior examination of 04/19/2023 and is indeterminate. No new focal pulmonary nodules or infiltrates. No pneumothorax or pleural effusion. Central airways are widely patent. Musculoskeletal: Widespread sclerotic metastases are again seen throughout the visualized axial skeleton, grossly stable since prior examination. No pathologic fracture. Review of the MIP images confirms the above findings. CT ABDOMEN and PELVIS FINDINGS Hepatobiliary: No focal liver abnormality is seen. No gallstones, gallbladder wall thickening, or biliary dilatation. Pancreas: Unremarkable Spleen: 19 and 16 mm hypoenhancing subcapsular masses within the spleen are stable since prior examination, compatible with splenic metastases. No new intrasplenic lesions. The spleen is of normal size. Adrenals/Urinary Tract: Bilateral adrenal metastases are grossly stable measuring 2.2 x 5.3  cm on the right and 1.3 x 2.5 cm on the left. The kidneys are unremarkable save for a simple cortical cyst within the lower pole the left kidney for which no specific follow-up imaging is recommended. The bladder is unremarkable. Stomach/Bowel: Stomach is within normal limits. Appendix appears normal. No evidence of bowel wall thickening, distention, or inflammatory changes. Vascular/Lymphatic: Aortic atherosclerosis. No enlarged abdominal or pelvic lymph nodes. Reproductive: Uterus and bilateral adnexa are unremarkable. Other: No abdominal wall hernia or abnormality. No abdominopelvic ascites. Musculoskeletal: Bilateral hip ORIF has been performed. Widespread sclerotic metastases are again seen throughout the visualized axial skeleton, grossly stable since prior examination. No pathologic fracture identified. Review of the MIP images confirms the above findings. IMPRESSION: 1. No pulmonary embolism. No acute intrathoracic pathology identified. 2. Minimal coronary artery  calcification. 3. Stable infiltrative soft tissue within the right paratracheal space, associated with narrowing of the superior vena cava in this region. This reflects the sequela of treated disease, better seen on PET CT examination of 10/02/2020. 4. Stable 7 mm pulmonary nodule within the subpleural left upper lobe, indeterminate. While stable since immediate prior examination, this first appeared on prior examination of 04/19/2023 and a a isolated pulmonary metastasis is not excluded. Close observation on subsequent surveillance examination is warranted. 5. Widespread sclerotic metastases throughout the visualized axial skeleton, grossly stable since prior examination. No pathologic fracture identified. 6. Stable bilateral adrenal metastases. 7. Stable splenic metastases. Aortic Atherosclerosis (ICD10-I70.0) and Emphysema (ICD10-J43.9). Electronically Signed   By: Helyn Numbers M.D.   On: 07/19/2023 18:54   DG Chest Port 1 View  Result Date: 07/19/2023 CLINICAL DATA:  Sepsis EXAM: PORTABLE CHEST 1 VIEW COMPARISON:  07/02/2023 FINDINGS: Lungs are clear. No pneumothorax or pleural effusion. Right internal jugular chest port tip seen within the right atrium. Cardiac size within normal limits. Pulmonary vascularity is normal. No acute bone abnormality. IMPRESSION: 1. No active disease. Electronically Signed   By: Helyn Numbers M.D.   On: 07/19/2023 18:25   CT Head Wo Contrast  Result Date: 07/19/2023 CLINICAL DATA:  Hypotension and altered mental status EXAM: CT HEAD WITHOUT CONTRAST TECHNIQUE: Contiguous axial images were obtained from the base of the skull through the vertex without intravenous contrast. RADIATION DOSE REDUCTION: This exam was performed according to the departmental dose-optimization program which includes automated exposure control, adjustment of the mA and/or kV according to patient size and/or use of iterative reconstruction technique. COMPARISON:  Brain MRI 07/05/2023 FINDINGS:  Brain: There is no acute intracranial hemorrhage, extra-axial fluid collection, or acute infarct. Parenchymal volume is normal. The ventricles are normal in size. Gray-white differentiation is preserved. The pituitary and suprasellar region are normal. Small hyperdense lesions are seen in the left frontal subcortical white matter and right peritrigonal white matter corresponding to known metastatic lesions, better evaluated on recent brain MRI. There is no parenchymal edema or mass effect. There is no midline shift. Vascular: There is calcification of the bilateral carotid siphons. Skull: Normal. Negative for fracture or focal lesion. Sinuses/Orbits: The imaged paranasal sinuses are clear. The globes and orbits are unremarkable. Other: The mastoid air cells and middle ear cavities are clear. IMPRESSION: 1. No acute intracranial pathology. 2. No parenchymal edema or mass effect related to the known intracranial metastatic disease which was better seen on recent brain MRI. Electronically Signed   By: Lesia Hausen M.D.   On: 07/19/2023 17:27   EEG adult  Result Date: 07/06/2023 Jefferson Fuel, MD     07/08/2023  4:13  AM Routine EEG Report KEEGHAN ANHORN is a 57 y.o. female with a history of seizures who is undergoing an EEG to evaluate for seizures. Report: This EEG was acquired with electrodes placed according to the International 10-20 electrode system (including Fp1, Fp2, F3, F4, C3, C4, P3, P4, O1, O2, T3, T4, T5, T6, A1, A2, Fz, Cz, Pz). The following electrodes were missing or displaced: none. The occipital dominant rhythm was 7 Hz with intermittent more pronounced diffuse slowing. This activity is reactive to stimulation. Drowsiness was manifested by background fragmentation; deeper stages of sleep were not identified. There was no focal slowing. There were no interictal epileptiform discharges. There were no electrographic seizures identified. There was no abnormal response to photic stimulation or  hyperventilation. Impression and clinical correlation: This EEG was obtained while awake and drowsy and is abnormal due to mild diffuse slowing indicative of global cerebral dysfunction. Epileptiform abnormalities were not seen during this recording. Bing Neighbors, MD [facility] [phone] If 7pm- 7am, please page neurology on call as listed in AMION.   MR BRAIN W WO CONTRAST  Result Date: 07/05/2023 CLINICAL DATA:  57 year old female altered mental status. 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 Subsequent encounter. EXAM: MRI HEAD WITHOUT AND WITH CONTRAST TECHNIQUE: Multiplanar, multiecho pulse sequences of the brain and surrounding structures were obtained without and with intravenous contrast. CONTRAST:  7.50mL GADAVIST GADOBUTROL 1 MMOL/ML IV SOLN COMPARISON:  Head CT this morning.  Brain MRI 01/20/2021. FINDINGS: Brain: Motion artifact on postcontrast imaging despite repeated imaging attempts. There are numerable subcentimeter FLAIR and T2 hyperintense foci scattered throughout the bilateral brain (see series 9), some of the largest of which correspond to the hyperdense foci by CT this morning (including adjacent to the right occipital horn series 9, image 24 measuring 9 mm). None of these are identified on SWI with susceptibility. However, some are mildly T1 hyperintense (such as in the left anterior frontal lobe series 7, image 16). But nearly all of the lesions are conspicuous on diffusion imaging, some are restricted and some have ring like morphology (series 13, image 28). None of the lesions are very apparent on motion degraded axial postcontrast imaging. But a few of them do appear to be enhancing on coronal postcontrast imaging in the posterior left hemisphere (series 21). Furthermore, when comparing pre and postcontrast sagittal T1 weighted imaging,  it is suspected that many of the small lesions do abnormally enhance (see series 22). No associated vasogenic edema. And no significant intracranial mass effect. No dural thickening or enhancement. No superimposed restricted diffusion which is strongly suggestive of acute infarction. No ventriculomegaly, extra-axial collection. Cervicomedullary junction and pituitary are within normal limits. Vascular: Major intracranial vascular flow voids are stable. Following contrast the major dural venous sinuses are enhancing and appear to be patent. Skull and upper cervical spine: Thick left superior frontal bone metastasis was better demonstrated by CT this morning, decreased T2 signal there is new since the 2022 MRI. No destructive osseous lesion is identified. Sinuses/Orbits: Stable, negative. Other: Mastoids remain well aerated. Negative visible scalp and face. IMPRESSION: 1. Innumerable subcentimeter brain lesions which are new since 2022. Although most are difficult to identify on motion degraded post-contrast images, the constellation of MRI findings is more suggestive of brain metastases than embolic infarcts or other differential considerations. And it is possible that most of the lesions are treated metastases, with no associated  vasogenic edema or mass effect. A short interval repeat MRI in 2 months may be valuable. 2. Progressed skull metastases since 2022 but no destructive osseous lesion is identified. Electronically Signed   By: Odessa Fleming M.D.   On: 07/05/2023 13:24   CT HEAD WO CONTRAST ( )  Result Date: 07/05/2023 CLINICAL DATA:  57 year old female altered mental status. Possible trace hemorrhage layering in the right occipital horn on head CT 0055 hours today., and small left anterior frontal lobe hyperdensity also. Metastatic lung cancer. EXAM: CT HEAD WITHOUT CONTRAST TECHNIQUE: Contiguous axial images were obtained from the base of the skull through the vertex without intravenous contrast. RADIATION  DOSE REDUCTION: This exam was performed according to the departmental dose-optimization program which includes automated exposure control, adjustment of the mA and/or kV according to patient size and/or use of iterative reconstruction technique. COMPARISON:  Head CT 0055 hours today.  Brain MRI 01/20/2021. FINDINGS: Brain: Unchanged small 5 mm hyperdense foci in both the left anterior frontal and right occipital lobes both visible on series 2, image 13 now. No associated edema or mass effect by CT. The right occipital lesion now is seen to be separate from the occipital horn which is on series 2, image 15. These appear to be parenchymal. Furthermore, there is a subtle 3rd hyperdense lesion in the right anterior inferior temporal lobe sagittal image 21. And possibly additional punctate lesions such as the left frontal lobe on series 2, image 17. All of these are new since 09/16/2022. No midline shift or intracranial mass effect. No ventriculomegaly. No extra-axial blood identified. No cortically based acute infarct identified. Normal basilar cisterns. Vascular: No suspicious intracranial vascular hyperdensity. Skull: Abnormal left frontal bone appears infiltrated with abnormal sclerosis and periosteal reaction on series 3, image 51, new since 09/16/2022. This is compatible with skull metastasis. No superimposed skull fracture is identified. No lytic bone lesion is identified. Sinuses/Orbits: Visualized paranasal sinuses and mastoids are stable and well aerated. Other: Visualized orbits and scalp soft tissues are within normal limits. IMPRESSION: 1. 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 2. At least 3 subcentimeter hyperdense foci in the bilateral brain are more likely small hyperdense Brain Metastases than areas of intracranial blood. Brain MRI without and with contrast should be confirmatory and could be compared to 01/20/2021. 3. No associated intracranial mass effect.  No cerebral  edema by CT. Electronically Signed   By: Odessa Fleming M.D.   On: 07/05/2023 07:54   CT CHEST ABDOMEN PELVIS W CONTRAST  Result Date: 07/05/2023 CLINICAL DATA:  Sepsis.  History of metastatic lung cancer. EXAM: CT CHEST, ABDOMEN, AND PELVIS WITH CONTRAST TECHNIQUE: Multidetector CT imaging of the chest, abdomen and pelvis was performed following the standard protocol during bolus administration of intravenous contrast. RADIATION DOSE REDUCTION: This exam was performed according to the departmental dose-optimization program which includes automated exposure control, adjustment of the mA and/or kV according to patient size and/or use of iterative reconstruction technique. CONTRAST:  OMNIPAQUE IOHEXOL 300 MG/ML  SOLN COMPARISON:  CT chest abdomen pelvis 04/19/2023 FINDINGS: CT CHEST FINDINGS Cardiovascular: Right chest wall accessed Port-A-Cath with tip at the superior cavoatrial junction. Normal heart size. No significant pericardial effusion. The thoracic aorta is normal in caliber. No atherosclerotic plaque of the thoracic aorta. No coronary artery calcifications. Mediastinum/Nodes: Stable 1 cm subcarinal lymph node. No enlarged mediastinal, hilar, or axillary lymph nodes. Trachea and esophagus demonstrate no significant findings. Tiny hiatal hernia. Subcentimeter hypodensity of the right thyroid  gland-no further follow-up indicated. Lungs/Pleura: Right upper lobe and lower lobe patchy airspace opacities. Stable left lower lobe 8 x 7 mm subpleural nodule (4:50). No pulmonary mass. No pleural effusion. No pneumothorax. Musculoskeletal: No chest wall abnormality. Chronic diffuse axial and appendicular sclerotic osseous lesions. No acute displaced fracture. Multilevel degenerative changes of the spine. CT ABDOMEN PELVIS FINDINGS Hepatobiliary: Vague hypodensity along the falciform ligament likely focal fatty infiltration. No gallstones, gallbladder wall thickening, or pericholecystic fluid. No biliary dilatation.  Pancreas: No focal lesion. Normal pancreatic contour. No surrounding inflammatory changes. No main pancreatic ductal dilatation. Spleen: Normal in size. Slightly more conspicuous splenic hypodensities measuring of 2 cm (2:53). Adrenals/Urinary Tract: Stable left adrenal gland nodules measuring of to 2.4 x 1.2 cm. Stable right adrenal gland nodules measuring up to 4.6 x 1.6 cm. Bilateral kidneys enhance symmetrically. No hydronephrosis. No hydroureter. The urinary bladder is unremarkable. Stomach/Bowel: Stomach is within normal limits. No evidence of bowel wall thickening or dilatation. Appendix appears normal. Vascular/Lymphatic: No abdominal aorta or iliac aneurysm. Mild atherosclerotic plaque of the aorta and its branches. No abdominal, pelvic, or inguinal lymphadenopathy. Reproductive: Uterus and bilateral adnexa are unremarkable. Other: No intraperitoneal free fluid. No intraperitoneal free gas. No organized fluid collection. Musculoskeletal: No abdominal wall hernia or abnormality. Chronic diffuse axial and appendicular sclerotic osseous lesions. No acute displaced fracture. Six lumbar vertebral bodies with sacralization of the L6 level. Grade 1 anterolisthesis L4 on L5. Mild retrolisthesis of L1 on L2. Bilateral intramedullary nail fixation of the femurs. Persistent periprosthetic lucency along the right femoral shaft surgical hardware. IMPRESSION: 1. Multifocal right lung pneumonia versus aspiration pneumonia. Underlying malignancy not fully excluded. 2. Stable left lower lobe 8 x 7 mm subpleural nodule. 3. Stable indeterminate bilateral adrenal gland nodules. 4. Stable indeterminate splenic hypodensities. 5. Chronic diffuse axial and appendicular sclerotic osseous metastases. Electronically Signed   By: Tish Frederickson M.D.   On: 07/05/2023 02:23   CT HEAD WO CONTRAST ( )  Result Date: 07/05/2023 CLINICAL DATA:  Mental status change, unknown cause Pt discharged yesterday after rod placement in L leg -  pt altered since d/c yesterday. EXAM: CT HEAD WITHOUT CONTRAST TECHNIQUE: Contiguous axial images were obtained from the base of the skull through the vertex without intravenous contrast. RADIATION DOSE REDUCTION: This exam was performed according to the departmental dose-optimization program which includes automated exposure control, adjustment of the mA and/or kV according to patient size and/or use of iterative reconstruction technique. COMPARISON:  CT head 09/16/2022 FINDINGS: Brain: No evidence of large-territorial acute infarction. No definite parenchymal hemorrhage. No mass lesion. No definite extra-axial collection. A 3 mm hyperdensity in the region of the posterior horn of the right lateral ventricle. Question vague 4 mm hyperdensity versus artifact within the left frontal lobe (3:14). No mass effect or midline shift. No hydrocephalus. Basilar cisterns are patent. Vascular: No hyperdense vessel. Skull: No acute fracture or focal lesion. Sinuses/Orbits: Paranasal sinuses and mastoid air cells are clear. The orbits are unremarkable. Other: None. IMPRESSION: 1. A 3 mm hyperdensity in the region of the posterior horn of the right lateral ventricle. Indeterminate etiology with hemorrhage not excluded. Recommend follow-up CT in 4-6 hours to evaluate for stability. 2. Question vague 4 mm hyperdensity versus artifact within the left frontal lobe. Recommended attention on follow-up Electronically Signed   By: Tish Frederickson M.D.   On: 07/05/2023 02:01   DG Chest 1 View  Result Date: 07/02/2023 CLINICAL DATA:  Tachycardia EXAM: CHEST  1 VIEW COMPARISON:  None Available.  FINDINGS: Abnormal mediastinal silhouette, compatible with known adenopathy. New somewhat nodular opacity in the right midlung. No visible pleural effusions or pneumothorax. Polyarticular degenerative change. IMPRESSION: 1. New somewhat nodular opacity in the right midlung which could represent progressive metastatic disease, treatment change,  and/or infection. CT of the chest with contrast could further evaluate if clinically warranted 2. Abnormal mediastinal silhouette, compatible with known lymphadenopathy. Electronically Signed   By: Feliberto Harts M.D.   On: 07/02/2023 13:02   DG HIP UNILAT WITH PELVIS 2-3 VIEWS LEFT  Result Date: 06/30/2023 CLINICAL DATA:  Left hip surgery.  Intraoperative fluoroscopy. EXAM: DG HIP (WITH OR WITHOUT PELVIS) 2-3V LEFT COMPARISON:  Left femur radiographs 04/20/2023 FINDINGS: Images were performed intraoperatively without the presence of a radiologist. Interval left femoral long intramedullary nail fixation, spanning the multiple previously seen sclerotic left femoral bone diaphyses. No hardware complication is seen. Total fluoroscopy images: 4 Total fluoroscopy time: 120 seconds Total dose: Radiation Exposure Index (as provided by the fluoroscopic device): 20.73 mGy air Kerma Please see intraoperative findings for further detail. IMPRESSION: Intraoperative fluoroscopy for left femoral long intramedullary nail fixation. Electronically Signed   By: Neita Garnet M.D.   On: 06/30/2023 16:44   DG C-Arm 1-60 Min-No Report  Result Date: 06/30/2023 Fluoroscopy was utilized by the requesting physician.  No radiographic interpretation.   DG C-Arm 1-60 Min-No Report  Result Date: 06/30/2023 Fluoroscopy was utilized by the requesting physician.  No radiographic interpretation.    PERFORMANCE STATUS (ECOG) : 1 - Symptomatic but completely ambulatory  Review of Systems Unless otherwise noted, a complete review of systems is negative.  Physical Exam General: NAD Cardiac: tachy, regular rhythm Pulmonary: clear anterior/posterior fields Abdominal: Soft, nontender to palp Extremities: no edema, no joint deformities Skin: no rashes Neurological: Weakness but otherwise nonfocal  IMPRESSION/PLAN: Stage IV non-small cell lung cancer -maintenance Avastin on hold due to recent left hip surgery and whole  brain radiation. Patient has follow up next week with Dr. Smith Robert.  Discussed goals with patient and husband in light of her poor performance status.  Patient has previously verbalized a strong desire to continue pursuing cancer treatments.  However, today she says that she would "rather die" and both patient and husband reference how taxing it has become physically transferring her back and forth to the cancer center for treatments.  Discussed option of hospice and patient is interested in pursuing comfort at home.  She confirms DNR/DNI and DNR order signed for patient to take home.  Neoplasm related pain -will increase dose of transdermal fentanyl to 50 mcg every 72 hours.  Continue oral hydromorphone as needed for breakthrough pain.  Case and plan discussed with Dr. Smith Robert.  Patient will see Dr. Smith Robert next week.  Patient expressed understanding and was in agreement with this plan. She also understands that She can call clinic at any time with any questions, concerns, or complaints.   Thank you for allowing me to participate in the care of this very pleasant patient.   Time Total: 25 minutes  Visit consisted of counseling and education dealing with the complex and emotionally intense issues of symptom management in the setting of serious illness.Greater than 50%  of this time was spent counseling and coordinating care related to the above assessment and plan.  Signed by: Laurette Schimke, PhD, NP-C

## 2023-08-01 ENCOUNTER — Inpatient Hospital Stay: Payer: 59 | Admitting: Oncology

## 2023-08-01 ENCOUNTER — Inpatient Hospital Stay: Payer: 59

## 2023-08-01 ENCOUNTER — Ambulatory Visit: Payer: 59

## 2023-08-02 ENCOUNTER — Inpatient Hospital Stay: Payer: 59

## 2023-08-02 ENCOUNTER — Inpatient Hospital Stay (HOSPITAL_BASED_OUTPATIENT_CLINIC_OR_DEPARTMENT_OTHER): Payer: 59 | Admitting: Oncology

## 2023-08-02 ENCOUNTER — Ambulatory Visit: Payer: 59

## 2023-08-02 ENCOUNTER — Other Ambulatory Visit: Payer: Self-pay

## 2023-08-02 ENCOUNTER — Encounter: Payer: Self-pay | Admitting: Oncology

## 2023-08-02 VITALS — BP 106/51 | HR 83 | Temp 92.2°F | Resp 22

## 2023-08-02 DIAGNOSIS — Z7189 Other specified counseling: Secondary | ICD-10-CM

## 2023-08-02 DIAGNOSIS — E860 Dehydration: Secondary | ICD-10-CM

## 2023-08-02 DIAGNOSIS — G893 Neoplasm related pain (acute) (chronic): Secondary | ICD-10-CM

## 2023-08-02 DIAGNOSIS — C3491 Malignant neoplasm of unspecified part of right bronchus or lung: Secondary | ICD-10-CM

## 2023-08-02 DIAGNOSIS — C7951 Secondary malignant neoplasm of bone: Secondary | ICD-10-CM | POA: Diagnosis not present

## 2023-08-02 LAB — CBC WITH DIFFERENTIAL (CANCER CENTER ONLY)
Abs Immature Granulocytes: 0.5 10*3/uL — ABNORMAL HIGH (ref 0.00–0.07)
Basophils Absolute: 0.1 10*3/uL (ref 0.0–0.1)
Basophils Relative: 0 %
Eosinophils Absolute: 0 10*3/uL (ref 0.0–0.5)
Eosinophils Relative: 0 %
HCT: 39.5 % (ref 36.0–46.0)
Hemoglobin: 12.8 g/dL (ref 12.0–15.0)
Immature Granulocytes: 4 %
Lymphocytes Relative: 6 %
Lymphs Abs: 0.9 10*3/uL (ref 0.7–4.0)
MCH: 27.6 pg (ref 26.0–34.0)
MCHC: 32.4 g/dL (ref 30.0–36.0)
MCV: 85.1 fL (ref 80.0–100.0)
Monocytes Absolute: 0.4 10*3/uL (ref 0.1–1.0)
Monocytes Relative: 3 %
Neutro Abs: 12 10*3/uL — ABNORMAL HIGH (ref 1.7–7.7)
Neutrophils Relative %: 87 %
Platelet Count: 188 10*3/uL (ref 150–400)
RBC: 4.64 MIL/uL (ref 3.87–5.11)
RDW: 16.6 % — ABNORMAL HIGH (ref 11.5–15.5)
WBC Count: 13.9 10*3/uL — ABNORMAL HIGH (ref 4.0–10.5)
nRBC: 0.1 % (ref 0.0–0.2)

## 2023-08-02 LAB — BASIC METABOLIC PANEL - CANCER CENTER ONLY
Anion gap: 15 (ref 5–15)
BUN: 27 mg/dL — ABNORMAL HIGH (ref 6–20)
CO2: 21 mmol/L — ABNORMAL LOW (ref 22–32)
Calcium: 8.9 mg/dL (ref 8.9–10.3)
Chloride: 94 mmol/L — ABNORMAL LOW (ref 98–111)
Creatinine: 0.53 mg/dL (ref 0.44–1.00)
GFR, Estimated: >60 mL/min (ref >60)
Glucose, Bld: 157 mg/dL — ABNORMAL HIGH (ref 70–99)
Potassium: 5.1 mmol/L (ref 3.5–5.1)
Sodium: 130 mmol/L — ABNORMAL LOW (ref 135–145)

## 2023-08-02 MED ORDER — SODIUM CHLORIDE 0.9 % IV SOLN
INTRAVENOUS | Status: DC
  Filled 2023-08-02 (×2): qty 250

## 2023-08-02 MED ORDER — HYDROMORPHONE HCL 1 MG/ML IJ SOLN
0.5000 mg | Freq: Once | INTRAMUSCULAR | Status: AC
  Administered 2023-08-02: 0.5 mg via INTRAVENOUS
  Filled 2023-08-02: qty 1

## 2023-08-02 MED ORDER — HEPARIN SOD (PORK) LOCK FLUSH 100 UNIT/ML IV SOLN
500.0000 [IU] | Freq: Once | INTRAVENOUS | Status: AC
  Administered 2023-08-02: 500 [IU] via INTRAVENOUS
  Filled 2023-08-02: qty 5

## 2023-08-02 MED ORDER — SODIUM CHLORIDE 0.9% FLUSH
10.0000 mL | Freq: Once | INTRAVENOUS | Status: AC
  Administered 2023-08-02: 10 mL via INTRAVENOUS
  Filled 2023-08-02: qty 10

## 2023-08-02 MED ORDER — HYDROMORPHONE HCL 1 MG/ML IJ SOLN
0.5000 mg | Freq: Once | INTRAMUSCULAR | Status: AC
Start: 2023-08-02 — End: 2023-08-02
  Administered 2023-08-02: 0.5 mg via INTRAVENOUS
  Filled 2023-08-02: qty 1

## 2023-08-02 NOTE — Progress Notes (Signed)
Hematology/Oncology Consult note [facility]  Telephone:([phone] [phone]  Patient Care Team: Armando Gang, FNP as PCP - General (Family Medicine) Glory Buff, RN as Oncology Nurse Navigator Carmina Miller, MD as Radiation Oncologist (Radiation Oncology) Vida Rigger, MD as Consulting Physician (Pulmonary Disease) Creig Hines, MD as Consulting Physician (Oncology)   Name of the patient: Gina Sanchez  [id number]  02/12/1966   Date of visit: 08/02/23  Diagnosis-stage IV adenocarcinoma of the lung  Chief complaint/ Reason for visit-discuss further management of metastatic lung cancer  Heme/Onc history: Patient is a 57 year old female with a past medical history significant for hypertension hyperlipidemia and anxiety who presented with right thigh pain and was found to have an acute right proximal femoral shaft fracture.  She underwent operative fixation on 10/12/2020.  MRI of femur showed heterogeneously enhancing osseous lesion within the area which would be nonspecific versus office neoplasm or metastatic lesion.  CT chest abdomen and pelvis with contrast showed an enlarged pretracheal lymph node 2.2 x 1.5 cm and a right paratracheal lymph node measuring 2.7 x 1.3 cm.  Prevascular node measuring 0.3 x 0.9 cm.  5 x 4 mm right middle lobe nodule.  2.8 x 1.9 cm left adrenal lesion.    Reamings from the right femur showed metastatic poorly differentiated carcinoma.  Immunohistochemistry showed was positive for pancytokeratin, CK7 and patchy CK20 with patchy dim expression of TTF-1.  Cells negative for Melan-A, CDX2, PAX8, Napsin A, GATA3, p40, CD56, p16 and thyroglobulin.  Findings compatible with metastatic carcinoma but because of decalcification immunohistochemical staining is unreliable.  Patchy dim staining with TTF-1 suspicious for lung primary but not a definitive diagnosis.   Repeat supraclavicular lymph node biopsy showed  metastatic adenocarcinoma.  Tumor cells positive for CK7 with focal weak staining for TTF-1.  Suggestive of lung origin in the proper clinical context.  Cells were negative for GATA3 PAX8 CDX2 and CK20 and Napsin A.  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  She is currently on hydrocortisone twice daily.  Thyroid functions presently are normal     Patient was treated with Ledell Noss Alimta Keytruda chemotherapy first-line followed by maintenance Alimta and Keytruda.  She developed autoimmune colitis secondary to [facility] and therefore that was stopped.  She is on Entyvio and follows up with GI for her autoimmune colitis.  Most recent regimen was maintenance Alimta. Disease progression in Jan 2024. Patients started on sotorasib.  Presently on full dose 960 mg daily.  Disease progression in May 2024.  Patient switched to CarboTaxol Avastin in June 2024   Patient was referred to [facility] for second opinion.  Prophylactic fixation of the left hip was recommended given concern for fracture risk.  Patient underwent fixation on 06/30/2023.  4 days following that patient was admitted to the hospital for symptoms of worsening hip pain as well as concern for pneumonia.  She also underwent MRI brain with and without contrast for altered mental status and was found to have multiple subcentimeter bilateral lesions new since 2022 concerning for metastatic disease.  CT chest abdomen and pelvis with contrast overall showed stable disease with no evidence of progression.  Interval history-overall patient's performance status has continued to worsen in the last 3 to 4 weeks.  She remains intermittently confused and cries out in pain from her back.  She  has been unable to cooperate and go through her whole brain radiation sessions.  ECOG PS- 3-4 Pain scale- 5 Opioid associated constipation-  no  Review of systems- Review of Systems  Unable to perform ROS: Mental acuity      Allergies  Allergen Reactions   Factive [Gemifloxacin] Rash     Past Medical History:  Diagnosis Date   Abnormal Pap smear of cervix    Anxiety    C. 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   Metastatic cancer (HCC) 10/2020   lung, bone, lymph node, adrenal   Palpitations    Precordial chest pain    Wrist fracture 03/2021   left     Past Surgical History:  Procedure Laterality Date   BREAST BIOPSY Right 2017   benign   CERVICAL BIOPSY  W/ LOOP ELECTRODE EXCISION     COLONOSCOPY  06/11/2022   Procedure: COLONOSCOPY;  Surgeon: Toney Reil, MD;  Location: [facility] ENDOSCOPY;  Service: Gastroenterology;;   COLONOSCOPY WITH PROPOFOL N/A 07/03/2015   Procedure: COLONOSCOPY WITH PROPOFOL;  Surgeon: Wallace Cullens, MD;  Location: [facility] ENDOSCOPY;  Service: Gastroenterology;  Laterality: N/A;   COLONOSCOPY WITH PROPOFOL N/A 01/06/2022   Procedure: COLONOSCOPY WITH PROPOFOL;  Surgeon: Toney Reil, MD;  Location[facility] ENDOSCOPY;  Service: Gastroenterology;  Laterality: N/A;   ESOPHAGOGASTRODUODENOSCOPY (EGD) WITH PROPOFOL N/A 07/03/2015   Procedure: ESOPHAGOGASTRODUODENOSCOPY (EGD) WITH PROPOFOL;  Surgeon: Wallace Cullens, MD;  Location: [facility] ENDOSCOPY;  Service: Gastroenterology;  Laterality: N/A;   FEMUR IM NAIL Left 06/30/2023   Procedure: Left prophylactic intramedullary nailing of impending left subtrochanteric femur fracture;  Surgeon: Christena Flake, MD;  Location: [facility] ORS;  Service: Orthopedics;  Laterality:  Left;   FLEXIBLE SIGMOIDOSCOPY N/A 03/12/2022   Procedure: FLEXIBLE SIGMOIDOSCOPY;  Surgeon: Toney Reil, MD;  Location: [facility] ENDOSCOPY;  Service: Gastroenterology;  Laterality: N/A;   FLEXIBLE SIGMOIDOSCOPY N/A 08/17/2022   Procedure: FLEXIBLE SIGMOIDOSCOPY;  Surgeon: Toney Reil, MD;  Location: [facility] ENDOSCOPY;  Service: Gastroenterology;  Laterality: N/A;   INTRAMEDULLARY (IM) NAIL INTERTROCHANTERIC Right 09/15/2020   Procedure: INTRAMEDULLARY (IM) NAIL INTERTROCHANTRIC;  Surgeon: Christena Flake, MD;  Location: [facility] ORS;  Service: Orthopedics;  Laterality: Right;   IR CV LINE INJECTION  07/14/2022   IR IMAGING GUIDED PORT INSERTION  11/28/2020   LEFT HEART CATH AND CORONARY ANGIOGRAPHY N/A 03/28/2017   Procedure: Left Heart Cath and Coronary Angiography;  Surgeon: Runell Gess, MD;  Location: [facility] INVASIVE CV LAB;  Service: Cardiovascular;  Laterality: N/A;   ORIF WRIST FRACTURE Left 03/31/2021   Procedure: OPEN REDUCTION INTERNAL FIXATION (ORIF) LEFT DISTAL RADIUS FRACTURE.;  Surgeon: Christena Flake, MD;  Location: [facility] ORS;  Service: Orthopedics;  Laterality: Left;    Social History   Socioeconomic History   Marital status: Married    Spouse name: Gene   Number of children: 2   Years of education: Not on file   Highest education level: Not on file  Occupational History   Not on file  Tobacco  Use   Smoking status: Former    Current packs/day: 0.00    Average packs/day: 1 pack/day for 30.0 years (30.0 ttl pk-yrs)    Types: Cigarettes    Start date: 09/17/1990    Quit date: 09/17/2020    Years since quitting: 2.8   Smokeless tobacco: Never  Vaping Use   Vaping status: Never Used  Substance and Sexual Activity   Alcohol use: Not Currently    Alcohol/week: 1.0 standard drink of alcohol    Types: 1 Standard drinks or equivalent per week    Comment: beer occ.   Drug use: No   Sexual activity: Yes    Birth control/protection: Post-menopausal  Other Topics Concern    Not on file  Social History Narrative   Lives in [place] with her husband.  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   The labs and vital signs and weight was done in infusion   Social Determinants of Health   Financial Resource Strain: Not on file  Food Insecurity: No Food Insecurity (07/19/2023)   Hunger Vital Sign    Worried About Running Out of Food in the Last Year: Never true    Ran Out of Food in the Last Year: Never true  Transportation Needs: No Transportation Needs (07/19/2023)   PRAPARE - Administrator, Civil Service (Medical): No    Lack of Transportation (Non-Medical): No  Physical Activity: Not on file  Stress: Not on file  Social Connections: Not on file  Intimate Partner Violence: Not At Risk (07/19/2023)   Humiliation, Afraid, Rape, and Kick questionnaire    Fear of Current or Ex-Partner: No    Emotionally Abused: No    Physically Abused: No    Sexually Abused: No    Family History  Problem Relation Age of Onset   Diabetes Mother        alive @ 60   Goiter Mother    Heart disease Father        died of MI @ 66   Osteoporosis Maternal Grandmother    Colon cancer Maternal Grandfather    Breast cancer Neg Hx    Ovarian cancer Neg Hx      Current Outpatient Medications:    acetaminophen (TYLENOL) 500 MG tablet, Take 500 mg by mouth every 6 (six) hours as needed., Disp: , Rfl:    citalopram (CELEXA) 10 MG tablet, Take 1 tablet (10 mg total) by mouth daily., Disp: 30 tablet, Rfl: 3   dexamethasone (DECADRON) 4 MG tablet, Take 1 tablet (4 mg total) by mouth daily., Disp: , Rfl:    feeding supplement (ENSURE ENLIVE / ENSURE PLUS) LIQD, Take 237 mLs by mouth 3 (three) times daily between meals., Disp: 21330 mL, Rfl: 0   fentaNYL (DURAGESIC) 50 MCG/HR, Place 1 patch onto the skin every 3 (three) days., Disp: 10 patch, Rfl: 0   hydrocortisone (CORTEF) 10 MG tablet, Take 1 tablet (10 mg total) by mouth at  bedtime., Disp: 30 tablet, Rfl: 3   hydrocortisone (CORTEF) 20 MG tablet, Take 20mg  tablet + 5mg  tablet to equal 25mg  daily in AM, Disp: 30 tablet, Rfl: 3   hydrocortisone (CORTEF) 5 MG tablet, Take with 20mg  tablet to equal 25mg  daily in AM, Disp: 30 tablet, Rfl: 3   HYDROmorphone (DILAUDID) 2 MG tablet, Take 1-2 tablets (2-4 mg total) by mouth every 4 (four) hours as needed for severe pain (pain score 7-10) or moderate pain (pain  score 4-6)., Disp: 20 tablet, Rfl: 0   levETIRAcetam (KEPPRA) 500 MG tablet, Take 1 tablet (500 mg total) by mouth 2 (two) times daily., Disp: 60 tablet, Rfl: 2   losartan (COZAAR) 50 MG tablet, Take 1 tablet (50 mg total) by mouth daily., Disp: 30 tablet, Rfl: 0   metoprolol succinate (TOPROL-XL) 50 MG 24 hr tablet, Take 50 mg by mouth daily. Take with or immediately following a meal., Disp: , Rfl:    nystatin (MYCOSTATIN) 100000 UNIT/ML suspension, Take 5 mLs (500,000 Units total) by mouth 4 (four) times daily for 10 days., Disp: 200 mL, Rfl: 0   ALPRAZolam (XANAX) 0.5 MG tablet, TAKE 1 TABLET BY MOUTH DAILY AS NEEDED FOR ANXIETY. (Patient not taking: Reported on 08/02/2023), Disp: 30 tablet, Rfl: 0   fluconazole (DIFLUCAN) 100 MG tablet, Take 1 tablet (100 mg total) by mouth daily. (Patient not taking: Reported on 08/02/2023), Disp: 13 tablet, Rfl: 0   HYDROmorphone (DILAUDID) 2 MG tablet, Take 1-2 tablets (2-4 mg total) by mouth every 4 (four) hours as needed for severe pain (pain score 7-10)., Disp: 120 tablet, Rfl: 0   lactulose (CHRONULAC) 10 GM/15ML solution, Take 45 mLs (30 g total) by mouth 2 (two) times daily as needed for mild constipation. (Patient not taking: Reported on 08/02/2023), Disp: 946 mL, Rfl: 1   ondansetron (ZOFRAN) 4 MG tablet, TAKE 1 TABLET BY MOUTH EVERY 8 HOURS AS NEEDED FOR NAUSEA AND VOMITING (Patient not taking: Reported on 08/02/2023), Disp: 20 tablet, Rfl: 1   prochlorperazine (COMPAZINE) 10 MG tablet, Take 1 tablet (10 mg total) by mouth  every 6 (six) hours as needed for nausea or vomiting. (Patient not taking: Reported on 08/02/2023), Disp: 30 tablet, Rfl: 1 No current facility-administered medications for this visit.  Facility-Administered Medications Ordered in Other Visits:    0.9 %  sodium chloride infusion, , Intravenous, Continuous, Borders, Daryl Eastern, NP, Stopped at 08/02/23 1145   sodium chloride flush (NS) 0.9 % injection 10 mL, 10 mL, Intravenous, PRN, Creig Hines, MD, 10 mL at 03/09/21 0906  Physical exam:  Vitals:   08/02/23 0951  BP: (!) 106/51  Pulse: 83  Resp: (!) 22  Temp: (!) 92.2 F (33.4 C)  TempSrc: Tympanic  SpO2: 100%   Physical Exam Constitutional:      Comments: She remains confused presently and unable to participate meaningfully in conversation  Cardiovascular:     Rate and Rhythm: Normal rate and regular rhythm.     Heart sounds: Normal heart sounds.  Pulmonary:     Effort: Pulmonary effort is normal.     Breath sounds: Normal breath sounds.  Skin:    General: Skin is warm and dry.  Neurological:     Mental Status: She is alert.     Comments: Oriented to self only         Latest Ref Rng & Units 08/02/2023    9:18 AM  CMP  Glucose 70 - 99 mg/dL 161   BUN 6 - 20 mg/dL 27   Creatinine 0.96 - 1.00 mg/dL 0.45   Sodium 409 - 811 mmol/L 130   Potassium 3.5 - 5.1 mmol/L 5.1   Chloride 98 - 111 mmol/L 94   CO2 22 - 32 mmol/L 21   Calcium 8.9 - 10.3 mg/dL 8.9       Latest Ref Rng & Units 08/02/2023    9:18 AM  CBC  WBC 4.0 - 10.5 K/uL 13.9   Hemoglobin 12.0 - 15.0  g/dL 75.6   Hematocrit 43.3 - 46.0 % 39.5   Platelets 150 - 400 K/uL 188     No images are attached to the encounter.  CT ABDOMEN PELVIS W CONTRAST  Result Date: 07/19/2023 CLINICAL DATA:  Pulmonary embolism, hypertension, altered mental status EXAM: CT ANGIOGRAPHY CHEST CT ABDOMEN AND PELVIS WITH CONTRAST TECHNIQUE: Multidetector CT imaging of the chest was performed using the standard protocol during bolus  administration of intravenous contrast. Multiplanar CT image reconstructions and MIPs were obtained to evaluate the vascular anatomy. Multidetector CT imaging of the abdomen and pelvis was performed using the standard protocol during bolus administration of intravenous contrast. RADIATION DOSE REDUCTION: This exam was performed according to the departmental dose-optimization program which includes automated exposure control, adjustment of the mA and/or kV according to patient size and/or use of iterative reconstruction technique. CONTRAST:  OMNIPAQUE IOHEXOL 350 MG/ML SOLN COMPARISON:  07/05/2023 FINDINGS: CTA CHEST FINDINGS Cardiovascular: Minimal coronary artery calcification. Global cardiac size within normal limits. No pericardial effusion. Central pulmonary arteries are adequately opacified and there is no intraluminal filling defect identified through the segmental level. Central pulmonary arteries are of normal caliber. The thoracic aorta is unremarkable. Right internal jugular chest port tip is seen within the right atrium. Mediastinum/Nodes: There is infiltrative soft tissue within the right paratracheal space, best seen on axial image # 45/4, which is stable since prior examination and is associated with narrowing of the superior vena cava in this region. This reflects the sequela of treated disease, better seen on PET CT examination of 10/02/2020. Borderline left pre-vascular lymph node is stable, nonspecific. No new pathologic thoracic adenopathy. Esophagus is unremarkable. Visualized thyroid is unremarkable. Lungs/Pleura: Previously noted focal consolidation within the a posterior right upper lobe and scattered ground-glass infiltrate within the right apex has improved in the interval in keeping with interval resolving infectious or inflammatory process. Residual scarring within the right upper lobe and right lower lobe noted. Mild emphysema. Stable 7 mm pulmonary nodule within the subpleural left  upper lobe, axial image # 50/6, indeterminate. While stable since immediate prior examination, this first appeared on prior examination of 04/19/2023 and is indeterminate. No new focal pulmonary nodules or infiltrates. No pneumothorax or pleural effusion. Central airways are widely patent. Musculoskeletal: Widespread sclerotic metastases are again seen throughout the visualized axial skeleton, grossly stable since prior examination. No pathologic fracture. Review of the MIP images confirms the above findings. CT ABDOMEN and PELVIS FINDINGS Hepatobiliary: No focal liver abnormality is seen. No gallstones, gallbladder wall thickening, or biliary dilatation. Pancreas: Unremarkable Spleen: 19 and 16 mm hypoenhancing subcapsular masses within the spleen are stable since prior examination, compatible with splenic metastases. No new intrasplenic lesions. The spleen is of normal size. Adrenals/Urinary Tract: Bilateral adrenal metastases are grossly stable measuring 2.2 x 5.3 cm on the right and 1.3 x 2.5 cm on the left. The kidneys are unremarkable save for a simple cortical cyst within the lower pole the left kidney for which no specific follow-up imaging is recommended. The bladder is unremarkable. Stomach/Bowel: Stomach is within normal limits. Appendix appears normal. No evidence of bowel wall thickening, distention, or inflammatory changes. Vascular/Lymphatic: Aortic atherosclerosis. No enlarged abdominal or pelvic lymph nodes. Reproductive: Uterus and bilateral adnexa are unremarkable. Other: No abdominal wall hernia or abnormality. No abdominopelvic ascites. Musculoskeletal: Bilateral hip ORIF has been performed. Widespread sclerotic metastases are again seen throughout the visualized axial skeleton, grossly stable since prior examination. No pathologic fracture identified. Review of the MIP images confirms  the above findings. IMPRESSION: 1. No pulmonary embolism. No acute intrathoracic pathology identified. 2.  Minimal coronary artery calcification. 3. Stable infiltrative soft tissue within the right paratracheal space, associated with narrowing of the superior vena cava in this region. This reflects the sequela of treated disease, better seen on PET CT examination of 10/02/2020. 4. Stable 7 mm pulmonary nodule within the subpleural left upper lobe, indeterminate. While stable since immediate prior examination, this first appeared on prior examination of 04/19/2023 and a a isolated pulmonary metastasis is not excluded. Close observation on subsequent surveillance examination is warranted. 5. Widespread sclerotic metastases throughout the visualized axial skeleton, grossly stable since prior examination. No pathologic fracture identified. 6. Stable bilateral adrenal metastases. 7. Stable splenic metastases. Aortic Atherosclerosis (ICD10-I70.0) and Emphysema (ICD10-J43.9). Electronically Signed   By: Helyn Numbers M.D.   On: 07/19/2023 18:54   CT Angio Chest PE W and/or Wo Contrast  Result Date: 07/19/2023 CLINICAL DATA:  Pulmonary embolism, hypertension, altered mental status EXAM: CT ANGIOGRAPHY CHEST CT ABDOMEN AND PELVIS WITH CONTRAST TECHNIQUE: Multidetector CT imaging of the chest was performed using the standard protocol during bolus administration of intravenous contrast. Multiplanar CT image reconstructions and MIPs were obtained to evaluate the vascular anatomy. Multidetector CT imaging of the abdomen and pelvis was performed using the standard protocol during bolus administration of intravenous contrast. RADIATION DOSE REDUCTION: This exam was performed according to the departmental dose-optimization program which includes automated exposure control, adjustment of the mA and/or kV according to patient size and/or use of iterative reconstruction technique. CONTRAST:  OMNIPAQUE IOHEXOL 350 MG/ML SOLN COMPARISON:  07/05/2023 FINDINGS: CTA CHEST FINDINGS Cardiovascular: Minimal coronary artery  calcification. Global cardiac size within normal limits. No pericardial effusion. Central pulmonary arteries are adequately opacified and there is no intraluminal filling defect identified through the segmental level. Central pulmonary arteries are of normal caliber. The thoracic aorta is unremarkable. Right internal jugular chest port tip is seen within the right atrium. Mediastinum/Nodes: There is infiltrative soft tissue within the right paratracheal space, best seen on axial image # 45/4, which is stable since prior examination and is associated with narrowing of the superior vena cava in this region. This reflects the sequela of treated disease, better seen on PET CT examination of 10/02/2020. Borderline left pre-vascular lymph node is stable, nonspecific. No new pathologic thoracic adenopathy. Esophagus is unremarkable. Visualized thyroid is unremarkable. Lungs/Pleura: Previously noted focal consolidation within the a posterior right upper lobe and scattered ground-glass infiltrate within the right apex has improved in the interval in keeping with interval resolving infectious or inflammatory process. Residual scarring within the right upper lobe and right lower lobe noted. Mild emphysema. Stable 7 mm pulmonary nodule within the subpleural left upper lobe, axial image # 50/6, indeterminate. While stable since immediate prior examination, this first appeared on prior examination of 04/19/2023 and is indeterminate. No new focal pulmonary nodules or infiltrates. No pneumothorax or pleural effusion. Central airways are widely patent. Musculoskeletal: Widespread sclerotic metastases are again seen throughout the visualized axial skeleton, grossly stable since prior examination. No pathologic fracture. Review of the MIP images confirms the above findings. CT ABDOMEN and PELVIS FINDINGS Hepatobiliary: No focal liver abnormality is seen. No gallstones, gallbladder wall thickening, or biliary dilatation. Pancreas:  Unremarkable Spleen: 19 and 16 mm hypoenhancing subcapsular masses within the spleen are stable since prior examination, compatible with splenic metastases. No new intrasplenic lesions. The spleen is of normal size. Adrenals/Urinary Tract: Bilateral adrenal metastases are grossly stable measuring 2.2 x  5.3 cm on the right and 1.3 x 2.5 cm on the left. The kidneys are unremarkable save for a simple cortical cyst within the lower pole the left kidney for which no specific follow-up imaging is recommended. The bladder is unremarkable. Stomach/Bowel: Stomach is within normal limits. Appendix appears normal. No evidence of bowel wall thickening, distention, or inflammatory changes. Vascular/Lymphatic: Aortic atherosclerosis. No enlarged abdominal or pelvic lymph nodes. Reproductive: Uterus and bilateral adnexa are unremarkable. Other: No abdominal wall hernia or abnormality. No abdominopelvic ascites. Musculoskeletal: Bilateral hip ORIF has been performed. Widespread sclerotic metastases are again seen throughout the visualized axial skeleton, grossly stable since prior examination. No pathologic fracture identified. Review of the MIP images confirms the above findings. IMPRESSION: 1. No pulmonary embolism. No acute intrathoracic pathology identified. 2. Minimal coronary artery calcification. 3. Stable infiltrative soft tissue within the right paratracheal space, associated with narrowing of the superior vena cava in this region. This reflects the sequela of treated disease, better seen on PET CT examination of 10/02/2020. 4. Stable 7 mm pulmonary nodule within the subpleural left upper lobe, indeterminate. While stable since immediate prior examination, this first appeared on prior examination of 04/19/2023 and a a isolated pulmonary metastasis is not excluded. Close observation on subsequent surveillance examination is warranted. 5. Widespread sclerotic metastases throughout the visualized axial skeleton, grossly  stable since prior examination. No pathologic fracture identified. 6. Stable bilateral adrenal metastases. 7. Stable splenic metastases. Aortic Atherosclerosis (ICD10-I70.0) and Emphysema (ICD10-J43.9). Electronically Signed   By: Helyn Numbers M.D.   On: 07/19/2023 18:54   DG Chest Port 1 View  Result Date: 07/19/2023 CLINICAL DATA:  Sepsis EXAM: PORTABLE CHEST 1 VIEW COMPARISON:  07/02/2023 FINDINGS: Lungs are clear. No pneumothorax or pleural effusion. Right internal jugular chest port tip seen within the right atrium. Cardiac size within normal limits. Pulmonary vascularity is normal. No acute bone abnormality. IMPRESSION: 1. No active disease. Electronically Signed   By: Helyn Numbers M.D.   On: 07/19/2023 18:25   CT Head Wo Contrast  Result Date: 07/19/2023 CLINICAL DATA:  Hypotension and altered mental status EXAM: CT HEAD WITHOUT CONTRAST TECHNIQUE: Contiguous axial images were obtained from the base of the skull through the vertex without intravenous contrast. RADIATION DOSE REDUCTION: This exam was performed according to the departmental dose-optimization program which includes automated exposure control, adjustment of the mA and/or kV according to patient size and/or use of iterative reconstruction technique. COMPARISON:  Brain MRI 07/05/2023 FINDINGS: Brain: There is no acute intracranial hemorrhage, extra-axial fluid collection, or acute infarct. Parenchymal volume is normal. The ventricles are normal in size. Gray-white differentiation is preserved. The pituitary and suprasellar region are normal. Small hyperdense lesions are seen in the left frontal subcortical white matter and right peritrigonal white matter corresponding to known metastatic lesions, better evaluated on recent brain MRI. There is no parenchymal edema or mass effect. There is no midline shift. Vascular: There is calcification of the bilateral carotid siphons. Skull: Normal. Negative for fracture or focal lesion.  Sinuses/Orbits: The imaged paranasal sinuses are clear. The globes and orbits are unremarkable. Other: The mastoid air cells and middle ear cavities are clear. IMPRESSION: 1. No acute intracranial pathology. 2. No parenchymal edema or mass effect related to the known intracranial metastatic disease which was better seen on recent brain MRI. Electronically Signed   By: Lesia Hausen M.D.   On: 07/19/2023 17:27   EEG adult  Result Date: 07/06/2023 Jefferson Fuel, MD     07/08/2023  4:13 AM Routine EEG Report ALYIANA GERKE is a 57 y.o. female with a history of seizures who is undergoing an EEG to evaluate for seizures. Report: This EEG was acquired with electrodes placed according to the International 10-20 electrode system (including Fp1, Fp2, F3, F4, C3, C4, P3, P4, O1, O2, T3, T4, T5, T6, A1, A2, Fz, Cz, Pz). The following electrodes were missing or displaced: none. The occipital dominant rhythm was 7 Hz with intermittent more pronounced diffuse slowing. This activity is reactive to stimulation. Drowsiness was manifested by background fragmentation; deeper stages of sleep were not identified. There was no focal slowing. There were no interictal epileptiform discharges. There were no electrographic seizures identified. There was no abnormal response to photic stimulation or hyperventilation. Impression and clinical correlation: This EEG was obtained while awake and drowsy and is abnormal due to mild diffuse slowing indicative of global cerebral dysfunction. Epileptiform abnormalities were not seen during this recording. Bing Neighbors, MD [facility] [phone] If 7pm- 7am, please page neurology on call as listed in AMION.   MR BRAIN W WO CONTRAST  Result Date: 07/05/2023 CLINICAL DATA:  57 year old female altered mental status. 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 Subsequent encounter. EXAM: MRI HEAD WITHOUT AND WITH CONTRAST TECHNIQUE: Multiplanar, multiecho pulse sequences of the brain and surrounding structures were obtained without and with intravenous contrast. CONTRAST:  7.82mL GADAVIST GADOBUTROL 1 MMOL/ML IV SOLN COMPARISON:  Head CT this morning.  Brain MRI 01/20/2021. FINDINGS: Brain: Motion artifact on postcontrast imaging despite repeated imaging attempts. There are numerable subcentimeter FLAIR and T2 hyperintense foci scattered throughout the bilateral brain (see series 9), some of the largest of which correspond to the hyperdense foci by CT this morning (including adjacent to the right occipital horn series 9, image 24 measuring 9 mm). None of these are identified on SWI with susceptibility. However, some are mildly T1 hyperintense (such as in the left anterior frontal lobe series 7, image 16). But nearly all of the lesions are conspicuous on diffusion imaging, some are restricted and some have ring like morphology (series 13, image 28). None of the lesions are very apparent on motion degraded axial postcontrast imaging. But a few of them do appear to be enhancing on coronal postcontrast imaging in the posterior left hemisphere (series 21). Furthermore, when comparing pre and postcontrast sagittal T1 weighted imaging, it is suspected that many of the small lesions do abnormally enhance (see series 22). No associated vasogenic edema. And no significant intracranial mass effect. No dural thickening or enhancement. No superimposed restricted diffusion which is strongly suggestive of acute infarction. No ventriculomegaly, extra-axial collection. Cervicomedullary junction and pituitary are within normal limits. Vascular: Major intracranial vascular flow voids are stable. Following contrast the major dural venous sinuses are enhancing and appear to be patent. Skull and upper cervical spine: Thick left superior frontal bone metastasis was better  demonstrated by CT this morning, decreased T2 signal there is new since the 2022 MRI. No destructive osseous lesion is identified. Sinuses/Orbits: Stable, negative. Other: Mastoids remain well aerated. Negative visible scalp and face. IMPRESSION: 1. Innumerable subcentimeter brain lesions which are new since 2022. Although most are difficult to identify on motion degraded post-contrast images, the constellation of MRI findings is more suggestive of brain metastases than embolic infarcts or other differential considerations. And it is possible that most of the lesions are treated metastases, with no  associated vasogenic edema or mass effect. A short interval repeat MRI in 2 months may be valuable. 2. Progressed skull metastases since 2022 but no destructive osseous lesion is identified. Electronically Signed   By: Odessa Fleming M.D.   On: 07/05/2023 13:24   CT HEAD WO CONTRAST ( )  Result Date: 07/05/2023 CLINICAL DATA:  56 year old female altered mental status. Possible trace hemorrhage layering in the right occipital horn on head CT 0055 hours today., and small left anterior frontal lobe hyperdensity also. Metastatic lung cancer. EXAM: CT HEAD WITHOUT CONTRAST TECHNIQUE: Contiguous axial images were obtained from the base of the skull through the vertex without intravenous contrast. RADIATION DOSE REDUCTION: This exam was performed according to the departmental dose-optimization program which includes automated exposure control, adjustment of the mA and/or kV according to patient size and/or use of iterative reconstruction technique. COMPARISON:  Head CT 0055 hours today.  Brain MRI 01/20/2021. FINDINGS: Brain: Unchanged small 5 mm hyperdense foci in both the left anterior frontal and right occipital lobes both visible on series 2, image 13 now. No associated edema or mass effect by CT. The right occipital lesion now is seen to be separate from the occipital horn which is on series 2, image 15. These appear to be  parenchymal. Furthermore, there is a subtle 3rd hyperdense lesion in the right anterior inferior temporal lobe sagittal image 21. And possibly additional punctate lesions such as the left frontal lobe on series 2, image 17. All of these are new since 09/16/2022. No midline shift or intracranial mass effect. No ventriculomegaly. No extra-axial blood identified. No cortically based acute infarct identified. Normal basilar cisterns. Vascular: No suspicious intracranial vascular hyperdensity. Skull: Abnormal left frontal bone appears infiltrated with abnormal sclerosis and periosteal reaction on series 3, image 51, new since 09/16/2022. This is compatible with skull metastasis. No superimposed skull fracture is identified. No lytic bone lesion is identified. Sinuses/Orbits: Visualized paranasal sinuses and mastoids are stable and well aerated. Other: Visualized orbits and scalp soft tissues are within normal limits. IMPRESSION: 1. 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 2. At least 3 subcentimeter hyperdense foci in the bilateral brain are more likely small hyperdense Brain Metastases than areas of intracranial blood. Brain MRI without and with contrast should be confirmatory and could be compared to 01/20/2021. 3. No associated intracranial mass effect.  No cerebral edema by CT. Electronically Signed   By: Odessa Fleming M.D.   On: 07/05/2023 07:54   CT CHEST ABDOMEN PELVIS W CONTRAST  Result Date: 07/05/2023 CLINICAL DATA:  Sepsis.  History of metastatic lung cancer. EXAM: CT CHEST, ABDOMEN, AND PELVIS WITH CONTRAST TECHNIQUE: Multidetector CT imaging of the chest, abdomen and pelvis was performed following the standard protocol during bolus administration of intravenous contrast. RADIATION DOSE REDUCTION: This exam was performed according to the departmental dose-optimization program which includes automated exposure control, adjustment of the mA and/or kV according to patient size and/or  use of iterative reconstruction technique. CONTRAST:  OMNIPAQUE IOHEXOL 300 MG/ML  SOLN COMPARISON:  CT chest abdomen pelvis 04/19/2023 FINDINGS: CT CHEST FINDINGS Cardiovascular: Right chest wall accessed Port-A-Cath with tip at the superior cavoatrial junction. Normal heart size. No significant pericardial effusion. The thoracic aorta is normal in caliber. No atherosclerotic plaque of the thoracic aorta. No coronary artery calcifications. Mediastinum/Nodes: Stable 1 cm subcarinal lymph node. No enlarged mediastinal, hilar, or axillary lymph nodes. Trachea and esophagus demonstrate no significant findings. Tiny hiatal hernia. Subcentimeter hypodensity of the right  thyroid gland-no further follow-up indicated. Lungs/Pleura: Right upper lobe and lower lobe patchy airspace opacities. Stable left lower lobe 8 x 7 mm subpleural nodule (4:50). No pulmonary mass. No pleural effusion. No pneumothorax. Musculoskeletal: No chest wall abnormality. Chronic diffuse axial and appendicular sclerotic osseous lesions. No acute displaced fracture. Multilevel degenerative changes of the spine. CT ABDOMEN PELVIS FINDINGS Hepatobiliary: Vague hypodensity along the falciform ligament likely focal fatty infiltration. No gallstones, gallbladder wall thickening, or pericholecystic fluid. No biliary dilatation. Pancreas: No focal lesion. Normal pancreatic contour. No surrounding inflammatory changes. No main pancreatic ductal dilatation. Spleen: Normal in size. Slightly more conspicuous splenic hypodensities measuring of 2 cm (2:53). Adrenals/Urinary Tract: Stable left adrenal gland nodules measuring of to 2.4 x 1.2 cm. Stable right adrenal gland nodules measuring up to 4.6 x 1.6 cm. Bilateral kidneys enhance symmetrically. No hydronephrosis. No hydroureter. The urinary bladder is unremarkable. Stomach/Bowel: Stomach is within normal limits. No evidence of bowel wall thickening or dilatation. Appendix appears normal.  Vascular/Lymphatic: No abdominal aorta or iliac aneurysm. Mild atherosclerotic plaque of the aorta and its branches. No abdominal, pelvic, or inguinal lymphadenopathy. Reproductive: Uterus and bilateral adnexa are unremarkable. Other: No intraperitoneal free fluid. No intraperitoneal free gas. No organized fluid collection. Musculoskeletal: No abdominal wall hernia or abnormality. Chronic diffuse axial and appendicular sclerotic osseous lesions. No acute displaced fracture. Six lumbar vertebral bodies with sacralization of the L6 level. Grade 1 anterolisthesis L4 on L5. Mild retrolisthesis of L1 on L2. Bilateral intramedullary nail fixation of the femurs. Persistent periprosthetic lucency along the right femoral shaft surgical hardware. IMPRESSION: 1. Multifocal right lung pneumonia versus aspiration pneumonia. Underlying malignancy not fully excluded. 2. Stable left lower lobe 8 x 7 mm subpleural nodule. 3. Stable indeterminate bilateral adrenal gland nodules. 4. Stable indeterminate splenic hypodensities. 5. Chronic diffuse axial and appendicular sclerotic osseous metastases. Electronically Signed   By: Tish Frederickson M.D.   On: 07/05/2023 02:23   CT HEAD WO CONTRAST ( )  Result Date: 07/05/2023 CLINICAL DATA:  Mental status change, unknown cause Pt discharged yesterday after rod placement in L leg - pt altered since d/c yesterday. EXAM: CT HEAD WITHOUT CONTRAST TECHNIQUE: Contiguous axial images were obtained from the base of the skull through the vertex without intravenous contrast. RADIATION DOSE REDUCTION: This exam was performed according to the departmental dose-optimization program which includes automated exposure control, adjustment of the mA and/or kV according to patient size and/or use of iterative reconstruction technique. COMPARISON:  CT head 09/16/2022 FINDINGS: Brain: No evidence of large-territorial acute infarction. No definite parenchymal hemorrhage. No mass lesion. No definite  extra-axial collection. A 3 mm hyperdensity in the region of the posterior horn of the right lateral ventricle. Question vague 4 mm hyperdensity versus artifact within the left frontal lobe (3:14). No mass effect or midline shift. No hydrocephalus. Basilar cisterns are patent. Vascular: No hyperdense vessel. Skull: No acute fracture or focal lesion. Sinuses/Orbits: Paranasal sinuses and mastoid air cells are clear. The orbits are unremarkable. Other: None. IMPRESSION: 1. A 3 mm hyperdensity in the region of the posterior horn of the right lateral ventricle. Indeterminate etiology with hemorrhage not excluded. Recommend follow-up CT in 4-6 hours to evaluate for stability. 2. Question vague 4 mm hyperdensity versus artifact within the left frontal lobe. Recommended attention on follow-up Electronically Signed   By: Tish Frederickson M.D.   On: 07/05/2023 02:01     Assessment and plan- Patient is a 57 y.o. female with history of metastatic adenocarcinoma of the lung most  recently on maintenance a Avastin here to discuss further goals of care with regards to lung cancer  Patient last received maintenance a Avastin on 05/23/2023.  Following that she underwent prophylactic left hip fixation on 06/30/2023 and a Avastin was held prior to surgery.  Back then patient was alert oriented and independent of her ADLs and IADLs and driving as well.  4 days post surgery patient was admitted to the hospital for acute encephalopathy and hip pain and was noted to have multiple subcentimeter brain lesions concerning for metastatic disease.  She was started on steroids but overall her performance status has continued to worsen.  Her appetite waxes and wanes.  She remains intermittently confused and unable to participate meaningfully in any goals of care conversation today.  I had a long discussion with the patient's husband.  So far patient has completed 7 out of 10 planned radiation treatments.  There have been interruptions during  this treatment due to patient's inability to cooperate.  Patient's husband notes that she feels worse after her radiation treatments and is more fatigued.  We discussed that although her systemic scans do not show any evidence of progression given the decline in her performance status as well as cognitive abilities we are unable to continue with any systemic treatment at this time.  I would recommend proceeding with home hospice and see how she does over the next 3 to 4 weeks.  If her performance status improves we could perhaps consider restarting maintenance a Avastin.  However if she continues to deteriorate continuing with hospice would be the most reasonable thing to do.  She is a DNR and patient's husband is willing to proceed with home hospice.  She will remain on steroids and we are holding off on further whole brain radiation treatments.   Visit Diagnosis 1. Goals of care, counseling/discussion   2. Primary malignant neoplasm of right lung metastatic to other site [facility])      Dr. Owens Shark, MD, MPH [facility] at [facility] [phone] 08/02/2023 2:49 PM

## 2023-08-03 ENCOUNTER — Ambulatory Visit: Payer: 59

## 2023-08-03 DIAGNOSIS — C7931 Secondary malignant neoplasm of brain: Secondary | ICD-10-CM | POA: Diagnosis not present

## 2023-08-03 DIAGNOSIS — K219 Gastro-esophageal reflux disease without esophagitis: Secondary | ICD-10-CM | POA: Diagnosis not present

## 2023-08-03 DIAGNOSIS — F411 Generalized anxiety disorder: Secondary | ICD-10-CM | POA: Diagnosis not present

## 2023-08-03 DIAGNOSIS — Z604 Social exclusion and rejection: Secondary | ICD-10-CM | POA: Diagnosis not present

## 2023-08-03 DIAGNOSIS — I10 Essential (primary) hypertension: Secondary | ICD-10-CM | POA: Diagnosis not present

## 2023-08-03 DIAGNOSIS — Z8701 Personal history of pneumonia (recurrent): Secondary | ICD-10-CM | POA: Diagnosis not present

## 2023-08-03 DIAGNOSIS — K5909 Other constipation: Secondary | ICD-10-CM | POA: Diagnosis not present

## 2023-08-03 DIAGNOSIS — F32A Depression, unspecified: Secondary | ICD-10-CM | POA: Diagnosis not present

## 2023-08-03 DIAGNOSIS — R32 Unspecified urinary incontinence: Secondary | ICD-10-CM | POA: Diagnosis not present

## 2023-08-03 DIAGNOSIS — Z87891 Personal history of nicotine dependence: Secondary | ICD-10-CM | POA: Diagnosis not present

## 2023-08-03 DIAGNOSIS — C7970 Secondary malignant neoplasm of unspecified adrenal gland: Secondary | ICD-10-CM | POA: Diagnosis not present

## 2023-08-03 DIAGNOSIS — E236 Other disorders of pituitary gland: Secondary | ICD-10-CM | POA: Diagnosis not present

## 2023-08-03 DIAGNOSIS — Z556 Problems related to health literacy: Secondary | ICD-10-CM | POA: Diagnosis not present

## 2023-08-03 DIAGNOSIS — K5790 Diverticulosis of intestine, part unspecified, without perforation or abscess without bleeding: Secondary | ICD-10-CM | POA: Diagnosis not present

## 2023-08-03 DIAGNOSIS — Z860100 Personal history of colon polyps, unspecified: Secondary | ICD-10-CM | POA: Diagnosis not present

## 2023-08-03 DIAGNOSIS — M84552D Pathological fracture in neoplastic disease, left femur, subsequent encounter for fracture with routine healing: Secondary | ICD-10-CM | POA: Diagnosis not present

## 2023-08-03 DIAGNOSIS — C770 Secondary and unspecified malignant neoplasm of lymph nodes of head, face and neck: Secondary | ICD-10-CM | POA: Diagnosis not present

## 2023-08-03 DIAGNOSIS — B379 Candidiasis, unspecified: Secondary | ICD-10-CM | POA: Diagnosis not present

## 2023-08-03 DIAGNOSIS — K523 Indeterminate colitis: Secondary | ICD-10-CM | POA: Diagnosis not present

## 2023-08-03 DIAGNOSIS — C7951 Secondary malignant neoplasm of bone: Secondary | ICD-10-CM | POA: Diagnosis not present

## 2023-08-03 DIAGNOSIS — C779 Secondary and unspecified malignant neoplasm of lymph node, unspecified: Secondary | ICD-10-CM | POA: Diagnosis not present

## 2023-08-03 DIAGNOSIS — R6510 Systemic inflammatory response syndrome (SIRS) of non-infectious origin without acute organ dysfunction: Secondary | ICD-10-CM | POA: Diagnosis not present

## 2023-08-03 DIAGNOSIS — C3491 Malignant neoplasm of unspecified part of right bronchus or lung: Secondary | ICD-10-CM | POA: Diagnosis not present

## 2023-08-03 DIAGNOSIS — E785 Hyperlipidemia, unspecified: Secondary | ICD-10-CM | POA: Diagnosis not present

## 2023-08-03 DIAGNOSIS — M5416 Radiculopathy, lumbar region: Secondary | ICD-10-CM | POA: Diagnosis not present

## 2023-08-03 DIAGNOSIS — G893 Neoplasm related pain (acute) (chronic): Secondary | ICD-10-CM | POA: Diagnosis not present

## 2023-08-04 ENCOUNTER — Ambulatory Visit: Payer: 59

## 2023-08-04 DIAGNOSIS — I10 Essential (primary) hypertension: Secondary | ICD-10-CM | POA: Diagnosis not present

## 2023-08-04 DIAGNOSIS — K219 Gastro-esophageal reflux disease without esophagitis: Secondary | ICD-10-CM | POA: Diagnosis not present

## 2023-08-04 DIAGNOSIS — C779 Secondary and unspecified malignant neoplasm of lymph node, unspecified: Secondary | ICD-10-CM | POA: Diagnosis not present

## 2023-08-04 DIAGNOSIS — R6510 Systemic inflammatory response syndrome (SIRS) of non-infectious origin without acute organ dysfunction: Secondary | ICD-10-CM | POA: Diagnosis not present

## 2023-08-04 DIAGNOSIS — C3491 Malignant neoplasm of unspecified part of right bronchus or lung: Secondary | ICD-10-CM | POA: Diagnosis not present

## 2023-08-04 DIAGNOSIS — C7931 Secondary malignant neoplasm of brain: Secondary | ICD-10-CM | POA: Diagnosis not present

## 2023-08-04 DIAGNOSIS — B379 Candidiasis, unspecified: Secondary | ICD-10-CM | POA: Diagnosis not present

## 2023-08-04 DIAGNOSIS — E236 Other disorders of pituitary gland: Secondary | ICD-10-CM | POA: Diagnosis not present

## 2023-08-04 DIAGNOSIS — E785 Hyperlipidemia, unspecified: Secondary | ICD-10-CM | POA: Diagnosis not present

## 2023-08-04 DIAGNOSIS — C7970 Secondary malignant neoplasm of unspecified adrenal gland: Secondary | ICD-10-CM | POA: Diagnosis not present

## 2023-08-04 DIAGNOSIS — C7951 Secondary malignant neoplasm of bone: Secondary | ICD-10-CM | POA: Diagnosis not present

## 2023-08-05 ENCOUNTER — Inpatient Hospital Stay: Payer: 59 | Admitting: Oncology

## 2023-08-05 ENCOUNTER — Inpatient Hospital Stay: Payer: 59

## 2023-08-05 DIAGNOSIS — C7951 Secondary malignant neoplasm of bone: Secondary | ICD-10-CM | POA: Diagnosis not present

## 2023-08-05 DIAGNOSIS — R6510 Systemic inflammatory response syndrome (SIRS) of non-infectious origin without acute organ dysfunction: Secondary | ICD-10-CM | POA: Diagnosis not present

## 2023-08-05 DIAGNOSIS — E236 Other disorders of pituitary gland: Secondary | ICD-10-CM | POA: Diagnosis not present

## 2023-08-05 DIAGNOSIS — C779 Secondary and unspecified malignant neoplasm of lymph node, unspecified: Secondary | ICD-10-CM | POA: Diagnosis not present

## 2023-08-05 DIAGNOSIS — I10 Essential (primary) hypertension: Secondary | ICD-10-CM | POA: Diagnosis not present

## 2023-08-05 DIAGNOSIS — K219 Gastro-esophageal reflux disease without esophagitis: Secondary | ICD-10-CM | POA: Diagnosis not present

## 2023-08-05 DIAGNOSIS — E785 Hyperlipidemia, unspecified: Secondary | ICD-10-CM | POA: Diagnosis not present

## 2023-08-05 DIAGNOSIS — C3491 Malignant neoplasm of unspecified part of right bronchus or lung: Secondary | ICD-10-CM | POA: Diagnosis not present

## 2023-08-05 DIAGNOSIS — B379 Candidiasis, unspecified: Secondary | ICD-10-CM | POA: Diagnosis not present

## 2023-08-05 DIAGNOSIS — C7970 Secondary malignant neoplasm of unspecified adrenal gland: Secondary | ICD-10-CM | POA: Diagnosis not present

## 2023-08-05 DIAGNOSIS — C7931 Secondary malignant neoplasm of brain: Secondary | ICD-10-CM | POA: Diagnosis not present

## 2023-08-06 DIAGNOSIS — C779 Secondary and unspecified malignant neoplasm of lymph node, unspecified: Secondary | ICD-10-CM | POA: Diagnosis not present

## 2023-08-06 DIAGNOSIS — C3491 Malignant neoplasm of unspecified part of right bronchus or lung: Secondary | ICD-10-CM | POA: Diagnosis not present

## 2023-08-06 DIAGNOSIS — E785 Hyperlipidemia, unspecified: Secondary | ICD-10-CM | POA: Diagnosis not present

## 2023-08-06 DIAGNOSIS — R6510 Systemic inflammatory response syndrome (SIRS) of non-infectious origin without acute organ dysfunction: Secondary | ICD-10-CM | POA: Diagnosis not present

## 2023-08-06 DIAGNOSIS — C7970 Secondary malignant neoplasm of unspecified adrenal gland: Secondary | ICD-10-CM | POA: Diagnosis not present

## 2023-08-06 DIAGNOSIS — C7951 Secondary malignant neoplasm of bone: Secondary | ICD-10-CM | POA: Diagnosis not present

## 2023-08-06 DIAGNOSIS — C7931 Secondary malignant neoplasm of brain: Secondary | ICD-10-CM | POA: Diagnosis not present

## 2023-08-06 DIAGNOSIS — E236 Other disorders of pituitary gland: Secondary | ICD-10-CM | POA: Diagnosis not present

## 2023-08-06 DIAGNOSIS — I10 Essential (primary) hypertension: Secondary | ICD-10-CM | POA: Diagnosis not present

## 2023-08-06 DIAGNOSIS — B379 Candidiasis, unspecified: Secondary | ICD-10-CM | POA: Diagnosis not present

## 2023-08-06 DIAGNOSIS — K219 Gastro-esophageal reflux disease without esophagitis: Secondary | ICD-10-CM | POA: Diagnosis not present

## 2023-08-07 DIAGNOSIS — C7931 Secondary malignant neoplasm of brain: Secondary | ICD-10-CM | POA: Diagnosis not present

## 2023-08-07 DIAGNOSIS — E785 Hyperlipidemia, unspecified: Secondary | ICD-10-CM | POA: Diagnosis not present

## 2023-08-07 DIAGNOSIS — C7970 Secondary malignant neoplasm of unspecified adrenal gland: Secondary | ICD-10-CM | POA: Diagnosis not present

## 2023-08-07 DIAGNOSIS — C779 Secondary and unspecified malignant neoplasm of lymph node, unspecified: Secondary | ICD-10-CM | POA: Diagnosis not present

## 2023-08-07 DIAGNOSIS — K219 Gastro-esophageal reflux disease without esophagitis: Secondary | ICD-10-CM | POA: Diagnosis not present

## 2023-08-07 DIAGNOSIS — R6510 Systemic inflammatory response syndrome (SIRS) of non-infectious origin without acute organ dysfunction: Secondary | ICD-10-CM | POA: Diagnosis not present

## 2023-08-07 DIAGNOSIS — C3491 Malignant neoplasm of unspecified part of right bronchus or lung: Secondary | ICD-10-CM | POA: Diagnosis not present

## 2023-08-07 DIAGNOSIS — I10 Essential (primary) hypertension: Secondary | ICD-10-CM | POA: Diagnosis not present

## 2023-08-07 DIAGNOSIS — C7951 Secondary malignant neoplasm of bone: Secondary | ICD-10-CM | POA: Diagnosis not present

## 2023-08-07 DIAGNOSIS — E236 Other disorders of pituitary gland: Secondary | ICD-10-CM | POA: Diagnosis not present

## 2023-08-07 DIAGNOSIS — B379 Candidiasis, unspecified: Secondary | ICD-10-CM | POA: Diagnosis not present

## 2023-08-08 DIAGNOSIS — E236 Other disorders of pituitary gland: Secondary | ICD-10-CM | POA: Diagnosis not present

## 2023-08-08 DIAGNOSIS — C7951 Secondary malignant neoplasm of bone: Secondary | ICD-10-CM | POA: Diagnosis not present

## 2023-08-08 DIAGNOSIS — C7931 Secondary malignant neoplasm of brain: Secondary | ICD-10-CM | POA: Diagnosis not present

## 2023-08-08 DIAGNOSIS — I10 Essential (primary) hypertension: Secondary | ICD-10-CM | POA: Diagnosis not present

## 2023-08-08 DIAGNOSIS — C7970 Secondary malignant neoplasm of unspecified adrenal gland: Secondary | ICD-10-CM | POA: Diagnosis not present

## 2023-08-08 DIAGNOSIS — E785 Hyperlipidemia, unspecified: Secondary | ICD-10-CM | POA: Diagnosis not present

## 2023-08-08 DIAGNOSIS — B379 Candidiasis, unspecified: Secondary | ICD-10-CM | POA: Diagnosis not present

## 2023-08-08 DIAGNOSIS — K219 Gastro-esophageal reflux disease without esophagitis: Secondary | ICD-10-CM | POA: Diagnosis not present

## 2023-08-08 DIAGNOSIS — R6510 Systemic inflammatory response syndrome (SIRS) of non-infectious origin without acute organ dysfunction: Secondary | ICD-10-CM | POA: Diagnosis not present

## 2023-08-08 DIAGNOSIS — C3491 Malignant neoplasm of unspecified part of right bronchus or lung: Secondary | ICD-10-CM | POA: Diagnosis not present

## 2023-08-08 DIAGNOSIS — C779 Secondary and unspecified malignant neoplasm of lymph node, unspecified: Secondary | ICD-10-CM | POA: Diagnosis not present

## 2023-08-09 DIAGNOSIS — E236 Other disorders of pituitary gland: Secondary | ICD-10-CM | POA: Diagnosis not present

## 2023-08-09 DIAGNOSIS — C779 Secondary and unspecified malignant neoplasm of lymph node, unspecified: Secondary | ICD-10-CM | POA: Diagnosis not present

## 2023-08-09 DIAGNOSIS — E785 Hyperlipidemia, unspecified: Secondary | ICD-10-CM | POA: Diagnosis not present

## 2023-08-09 DIAGNOSIS — C7970 Secondary malignant neoplasm of unspecified adrenal gland: Secondary | ICD-10-CM | POA: Diagnosis not present

## 2023-08-09 DIAGNOSIS — I10 Essential (primary) hypertension: Secondary | ICD-10-CM | POA: Diagnosis not present

## 2023-08-09 DIAGNOSIS — C7951 Secondary malignant neoplasm of bone: Secondary | ICD-10-CM | POA: Diagnosis not present

## 2023-08-09 DIAGNOSIS — C7931 Secondary malignant neoplasm of brain: Secondary | ICD-10-CM | POA: Diagnosis not present

## 2023-08-09 DIAGNOSIS — R6510 Systemic inflammatory response syndrome (SIRS) of non-infectious origin without acute organ dysfunction: Secondary | ICD-10-CM | POA: Diagnosis not present

## 2023-08-09 DIAGNOSIS — K219 Gastro-esophageal reflux disease without esophagitis: Secondary | ICD-10-CM | POA: Diagnosis not present

## 2023-08-09 DIAGNOSIS — C3491 Malignant neoplasm of unspecified part of right bronchus or lung: Secondary | ICD-10-CM | POA: Diagnosis not present

## 2023-08-09 DIAGNOSIS — B379 Candidiasis, unspecified: Secondary | ICD-10-CM | POA: Diagnosis not present

## 2023-08-10 DIAGNOSIS — C7951 Secondary malignant neoplasm of bone: Secondary | ICD-10-CM | POA: Diagnosis not present

## 2023-08-10 DIAGNOSIS — C3491 Malignant neoplasm of unspecified part of right bronchus or lung: Secondary | ICD-10-CM | POA: Diagnosis not present

## 2023-08-10 DIAGNOSIS — C7970 Secondary malignant neoplasm of unspecified adrenal gland: Secondary | ICD-10-CM | POA: Diagnosis not present

## 2023-08-10 DIAGNOSIS — I10 Essential (primary) hypertension: Secondary | ICD-10-CM | POA: Diagnosis not present

## 2023-08-10 DIAGNOSIS — E785 Hyperlipidemia, unspecified: Secondary | ICD-10-CM | POA: Diagnosis not present

## 2023-08-10 DIAGNOSIS — E236 Other disorders of pituitary gland: Secondary | ICD-10-CM | POA: Diagnosis not present

## 2023-08-10 DIAGNOSIS — R6510 Systemic inflammatory response syndrome (SIRS) of non-infectious origin without acute organ dysfunction: Secondary | ICD-10-CM | POA: Diagnosis not present

## 2023-08-10 DIAGNOSIS — C779 Secondary and unspecified malignant neoplasm of lymph node, unspecified: Secondary | ICD-10-CM | POA: Diagnosis not present

## 2023-08-10 DIAGNOSIS — C7931 Secondary malignant neoplasm of brain: Secondary | ICD-10-CM | POA: Diagnosis not present

## 2023-08-10 DIAGNOSIS — K219 Gastro-esophageal reflux disease without esophagitis: Secondary | ICD-10-CM | POA: Diagnosis not present

## 2023-08-10 DIAGNOSIS — B379 Candidiasis, unspecified: Secondary | ICD-10-CM | POA: Diagnosis not present

## 2023-08-11 DIAGNOSIS — B379 Candidiasis, unspecified: Secondary | ICD-10-CM | POA: Diagnosis not present

## 2023-08-11 DIAGNOSIS — I10 Essential (primary) hypertension: Secondary | ICD-10-CM | POA: Diagnosis not present

## 2023-08-11 DIAGNOSIS — C779 Secondary and unspecified malignant neoplasm of lymph node, unspecified: Secondary | ICD-10-CM | POA: Diagnosis not present

## 2023-08-11 DIAGNOSIS — C7970 Secondary malignant neoplasm of unspecified adrenal gland: Secondary | ICD-10-CM | POA: Diagnosis not present

## 2023-08-11 DIAGNOSIS — C7951 Secondary malignant neoplasm of bone: Secondary | ICD-10-CM | POA: Diagnosis not present

## 2023-08-11 DIAGNOSIS — K219 Gastro-esophageal reflux disease without esophagitis: Secondary | ICD-10-CM | POA: Diagnosis not present

## 2023-08-11 DIAGNOSIS — C7931 Secondary malignant neoplasm of brain: Secondary | ICD-10-CM | POA: Diagnosis not present

## 2023-08-11 DIAGNOSIS — E785 Hyperlipidemia, unspecified: Secondary | ICD-10-CM | POA: Diagnosis not present

## 2023-08-11 DIAGNOSIS — R6510 Systemic inflammatory response syndrome (SIRS) of non-infectious origin without acute organ dysfunction: Secondary | ICD-10-CM | POA: Diagnosis not present

## 2023-08-11 DIAGNOSIS — C3491 Malignant neoplasm of unspecified part of right bronchus or lung: Secondary | ICD-10-CM | POA: Diagnosis not present

## 2023-08-11 DIAGNOSIS — E236 Other disorders of pituitary gland: Secondary | ICD-10-CM | POA: Diagnosis not present

## 2023-08-14 DEATH — deceased

## 2023-08-17 ENCOUNTER — Ambulatory Visit: Payer: 59 | Admitting: Radiation Oncology

## 2023-09-01 ENCOUNTER — Ambulatory Visit: Payer: 59 | Admitting: Radiation Oncology

## 2023-09-15 NOTE — Telephone Encounter (Signed)
 Error

## 2024-03-27 ENCOUNTER — Other Ambulatory Visit: Payer: Self-pay
